# Patient Record
Sex: Female | Born: 1940 | Race: White | Hispanic: No | Marital: Married | Smoking: Former smoker
Health system: Southern US, Community
[De-identification: ages and names within clinical notes are randomized; demographics above are authoritative.]

## PROBLEM LIST (undated history)

## (undated) DIAGNOSIS — I8390 Asymptomatic varicose veins of unspecified lower extremity: Secondary | ICD-10-CM

## (undated) DIAGNOSIS — E039 Hypothyroidism, unspecified: Secondary | ICD-10-CM

## (undated) DIAGNOSIS — J45909 Unspecified asthma, uncomplicated: Secondary | ICD-10-CM

## (undated) DIAGNOSIS — L409 Psoriasis, unspecified: Secondary | ICD-10-CM

## (undated) DIAGNOSIS — D649 Anemia, unspecified: Secondary | ICD-10-CM

## (undated) DIAGNOSIS — K219 Gastro-esophageal reflux disease without esophagitis: Secondary | ICD-10-CM

## (undated) DIAGNOSIS — I4892 Unspecified atrial flutter: Secondary | ICD-10-CM

## (undated) DIAGNOSIS — I739 Peripheral vascular disease, unspecified: Secondary | ICD-10-CM

## (undated) DIAGNOSIS — J302 Other seasonal allergic rhinitis: Secondary | ICD-10-CM

## (undated) DIAGNOSIS — R600 Localized edema: Secondary | ICD-10-CM

## (undated) DIAGNOSIS — I499 Cardiac arrhythmia, unspecified: Secondary | ICD-10-CM

## (undated) DIAGNOSIS — R609 Edema, unspecified: Secondary | ICD-10-CM

## (undated) DIAGNOSIS — G629 Polyneuropathy, unspecified: Secondary | ICD-10-CM

## (undated) DIAGNOSIS — R42 Dizziness and giddiness: Secondary | ICD-10-CM

## (undated) DIAGNOSIS — K5900 Constipation, unspecified: Secondary | ICD-10-CM

## (undated) DIAGNOSIS — E079 Disorder of thyroid, unspecified: Secondary | ICD-10-CM

## (undated) DIAGNOSIS — M1990 Unspecified osteoarthritis, unspecified site: Secondary | ICD-10-CM

## (undated) DIAGNOSIS — I10 Essential (primary) hypertension: Secondary | ICD-10-CM

## (undated) DIAGNOSIS — F419 Anxiety disorder, unspecified: Secondary | ICD-10-CM

## (undated) DIAGNOSIS — C4490 Unspecified malignant neoplasm of skin, unspecified: Secondary | ICD-10-CM

## (undated) HISTORY — DX: Constipation, unspecified: K59.00

## (undated) HISTORY — PX: MOUTH SURGERY: SHX715

## (undated) HISTORY — PX: TUBAL LIGATION: SHX77

## (undated) HISTORY — DX: Other seasonal allergic rhinitis: J30.2

## (undated) HISTORY — DX: Unspecified osteoarthritis, unspecified site: M19.90

## (undated) HISTORY — DX: Asymptomatic varicose veins of unspecified lower extremity: I83.90

## (undated) HISTORY — PX: APPENDECTOMY: SHX54

## (undated) HISTORY — DX: Essential (primary) hypertension: I10

## (undated) HISTORY — PX: CHOLECYSTECTOMY: SHX55

## (undated) HISTORY — DX: Anemia, unspecified: D64.9

## (undated) HISTORY — DX: Dizziness and giddiness: R42

## (undated) HISTORY — DX: Disorder of thyroid, unspecified: E07.9

---

## 2001-03-30 ENCOUNTER — Encounter: Payer: Self-pay | Admitting: Obstetrics and Gynecology

## 2001-03-30 ENCOUNTER — Ambulatory Visit (HOSPITAL_COMMUNITY): Admission: RE | Admit: 2001-03-30 | Discharge: 2001-03-30 | Payer: Self-pay | Admitting: Obstetrics and Gynecology

## 2001-05-29 ENCOUNTER — Other Ambulatory Visit: Admission: RE | Admit: 2001-05-29 | Discharge: 2001-05-29 | Payer: Self-pay | Admitting: Obstetrics and Gynecology

## 2001-09-10 ENCOUNTER — Other Ambulatory Visit: Admission: RE | Admit: 2001-09-10 | Discharge: 2001-09-10 | Payer: Self-pay | Admitting: Dermatology

## 2002-03-29 ENCOUNTER — Other Ambulatory Visit: Admission: RE | Admit: 2002-03-29 | Discharge: 2002-03-29 | Payer: Self-pay | Admitting: Dermatology

## 2002-04-01 ENCOUNTER — Ambulatory Visit (HOSPITAL_COMMUNITY): Admission: RE | Admit: 2002-04-01 | Discharge: 2002-04-01 | Payer: Self-pay | Admitting: Obstetrics and Gynecology

## 2002-04-01 ENCOUNTER — Encounter: Payer: Self-pay | Admitting: Obstetrics and Gynecology

## 2002-06-28 ENCOUNTER — Other Ambulatory Visit: Admission: RE | Admit: 2002-06-28 | Discharge: 2002-06-28 | Payer: Self-pay | Admitting: Dermatology

## 2002-11-25 ENCOUNTER — Encounter: Payer: Self-pay | Admitting: Emergency Medicine

## 2002-11-25 ENCOUNTER — Emergency Department (HOSPITAL_COMMUNITY): Admission: EM | Admit: 2002-11-25 | Discharge: 2002-11-25 | Payer: Self-pay | Admitting: Emergency Medicine

## 2003-01-31 ENCOUNTER — Ambulatory Visit (HOSPITAL_COMMUNITY): Admission: RE | Admit: 2003-01-31 | Discharge: 2003-01-31 | Payer: Self-pay | Admitting: Pulmonary Disease

## 2003-03-03 ENCOUNTER — Other Ambulatory Visit: Admission: RE | Admit: 2003-03-03 | Discharge: 2003-03-03 | Payer: Self-pay | Admitting: Dermatology

## 2003-04-25 ENCOUNTER — Ambulatory Visit (HOSPITAL_COMMUNITY): Admission: RE | Admit: 2003-04-25 | Discharge: 2003-04-25 | Payer: Self-pay | Admitting: Pulmonary Disease

## 2003-05-04 ENCOUNTER — Encounter: Payer: Self-pay | Admitting: Obstetrics and Gynecology

## 2003-05-04 ENCOUNTER — Ambulatory Visit (HOSPITAL_COMMUNITY): Admission: RE | Admit: 2003-05-04 | Discharge: 2003-05-04 | Payer: Self-pay | Admitting: Obstetrics and Gynecology

## 2003-05-06 ENCOUNTER — Encounter (HOSPITAL_COMMUNITY): Admission: RE | Admit: 2003-05-06 | Discharge: 2003-06-05 | Payer: Self-pay | Admitting: Pulmonary Disease

## 2003-05-12 ENCOUNTER — Inpatient Hospital Stay (HOSPITAL_COMMUNITY): Admission: EM | Admit: 2003-05-12 | Discharge: 2003-05-13 | Payer: Self-pay | Admitting: Cardiology

## 2003-05-17 ENCOUNTER — Inpatient Hospital Stay (HOSPITAL_COMMUNITY): Admission: EM | Admit: 2003-05-17 | Discharge: 2003-05-23 | Payer: Self-pay | Admitting: Emergency Medicine

## 2003-05-18 ENCOUNTER — Encounter: Payer: Self-pay | Admitting: Cardiovascular Disease

## 2003-11-05 ENCOUNTER — Emergency Department (HOSPITAL_COMMUNITY): Admission: EM | Admit: 2003-11-05 | Discharge: 2003-11-06 | Payer: Self-pay | Admitting: Emergency Medicine

## 2003-12-26 ENCOUNTER — Ambulatory Visit (HOSPITAL_COMMUNITY): Admission: RE | Admit: 2003-12-26 | Discharge: 2003-12-26 | Payer: Self-pay | Admitting: Pulmonary Disease

## 2004-05-07 ENCOUNTER — Ambulatory Visit (HOSPITAL_COMMUNITY): Admission: RE | Admit: 2004-05-07 | Discharge: 2004-05-07 | Payer: Self-pay | Admitting: Pulmonary Disease

## 2004-05-24 ENCOUNTER — Ambulatory Visit (HOSPITAL_COMMUNITY): Admission: RE | Admit: 2004-05-24 | Discharge: 2004-05-24 | Payer: Self-pay | Admitting: Pulmonary Disease

## 2004-06-01 ENCOUNTER — Ambulatory Visit (HOSPITAL_COMMUNITY): Admission: RE | Admit: 2004-06-01 | Discharge: 2004-06-01 | Payer: Self-pay | Admitting: Pulmonary Disease

## 2004-06-27 ENCOUNTER — Ambulatory Visit (HOSPITAL_COMMUNITY): Admission: RE | Admit: 2004-06-27 | Discharge: 2004-06-27 | Payer: Self-pay | Admitting: Obstetrics & Gynecology

## 2004-11-17 ENCOUNTER — Emergency Department (HOSPITAL_COMMUNITY): Admission: EM | Admit: 2004-11-17 | Discharge: 2004-11-17 | Payer: Self-pay | Admitting: Emergency Medicine

## 2004-11-21 ENCOUNTER — Ambulatory Visit (HOSPITAL_COMMUNITY): Admission: RE | Admit: 2004-11-21 | Discharge: 2004-11-21 | Payer: Self-pay | Admitting: Family Medicine

## 2005-05-29 ENCOUNTER — Ambulatory Visit (HOSPITAL_COMMUNITY): Admission: RE | Admit: 2005-05-29 | Discharge: 2005-05-29 | Payer: Self-pay | Admitting: Obstetrics & Gynecology

## 2006-06-19 ENCOUNTER — Ambulatory Visit (HOSPITAL_COMMUNITY): Admission: RE | Admit: 2006-06-19 | Discharge: 2006-06-19 | Payer: Self-pay | Admitting: Obstetrics & Gynecology

## 2006-06-28 ENCOUNTER — Emergency Department (HOSPITAL_COMMUNITY): Admission: EM | Admit: 2006-06-28 | Discharge: 2006-06-28 | Payer: Self-pay | Admitting: Emergency Medicine

## 2006-09-17 ENCOUNTER — Ambulatory Visit (HOSPITAL_COMMUNITY): Admission: RE | Admit: 2006-09-17 | Discharge: 2006-09-17 | Payer: Self-pay | Admitting: Internal Medicine

## 2006-09-17 ENCOUNTER — Ambulatory Visit: Payer: Self-pay | Admitting: Internal Medicine

## 2006-10-08 ENCOUNTER — Ambulatory Visit: Payer: Self-pay | Admitting: Gastroenterology

## 2007-02-17 ENCOUNTER — Ambulatory Visit (HOSPITAL_COMMUNITY): Admission: RE | Admit: 2007-02-17 | Discharge: 2007-02-17 | Payer: Self-pay | Admitting: Family Medicine

## 2007-02-18 ENCOUNTER — Ambulatory Visit (HOSPITAL_COMMUNITY): Admission: RE | Admit: 2007-02-18 | Discharge: 2007-02-18 | Payer: Self-pay | Admitting: Family Medicine

## 2007-03-09 ENCOUNTER — Ambulatory Visit (HOSPITAL_COMMUNITY): Admission: RE | Admit: 2007-03-09 | Discharge: 2007-03-09 | Payer: Self-pay | Admitting: Family Medicine

## 2007-04-23 ENCOUNTER — Ambulatory Visit: Payer: Self-pay | Admitting: Orthopedic Surgery

## 2007-06-22 ENCOUNTER — Ambulatory Visit (HOSPITAL_COMMUNITY): Admission: RE | Admit: 2007-06-22 | Discharge: 2007-06-22 | Payer: Self-pay | Admitting: Obstetrics and Gynecology

## 2007-08-20 ENCOUNTER — Other Ambulatory Visit: Admission: RE | Admit: 2007-08-20 | Discharge: 2007-08-20 | Payer: Self-pay | Admitting: Obstetrics & Gynecology

## 2007-08-27 ENCOUNTER — Ambulatory Visit (HOSPITAL_COMMUNITY): Admission: RE | Admit: 2007-08-27 | Discharge: 2007-08-27 | Payer: Self-pay | Admitting: Obstetrics & Gynecology

## 2007-12-29 ENCOUNTER — Ambulatory Visit (HOSPITAL_COMMUNITY): Admission: RE | Admit: 2007-12-29 | Discharge: 2007-12-29 | Payer: Self-pay | Admitting: Family Medicine

## 2007-12-31 ENCOUNTER — Ambulatory Visit (HOSPITAL_COMMUNITY): Admission: RE | Admit: 2007-12-31 | Discharge: 2007-12-31 | Payer: Self-pay | Admitting: Family Medicine

## 2007-12-31 ENCOUNTER — Ambulatory Visit: Payer: Self-pay | Admitting: Cardiology

## 2008-01-29 DIAGNOSIS — Z8679 Personal history of other diseases of the circulatory system: Secondary | ICD-10-CM | POA: Insufficient documentation

## 2008-06-02 ENCOUNTER — Emergency Department (HOSPITAL_COMMUNITY): Admission: EM | Admit: 2008-06-02 | Discharge: 2008-06-02 | Payer: Self-pay | Admitting: Emergency Medicine

## 2008-06-23 ENCOUNTER — Ambulatory Visit (HOSPITAL_COMMUNITY): Admission: RE | Admit: 2008-06-23 | Discharge: 2008-06-23 | Payer: Self-pay | Admitting: Family Medicine

## 2008-09-05 ENCOUNTER — Other Ambulatory Visit: Admission: RE | Admit: 2008-09-05 | Discharge: 2008-09-05 | Payer: Self-pay | Admitting: Obstetrics and Gynecology

## 2008-09-19 ENCOUNTER — Ambulatory Visit: Payer: Self-pay | Admitting: Internal Medicine

## 2008-09-26 ENCOUNTER — Ambulatory Visit: Payer: Self-pay | Admitting: Internal Medicine

## 2008-10-12 ENCOUNTER — Ambulatory Visit: Payer: Self-pay | Admitting: Internal Medicine

## 2008-12-13 ENCOUNTER — Encounter: Payer: Self-pay | Admitting: Gastroenterology

## 2008-12-13 LAB — CONVERTED CEMR LAB
Eosinophils Relative: 5 % (ref 0–5)
HCT: 40.4 % (ref 36.0–46.0)
Lymphocytes Relative: 27 % (ref 12–46)
Lymphs Abs: 2.4 10*3/uL (ref 0.7–4.0)
Neutro Abs: 5.1 10*3/uL (ref 1.7–7.7)
Neutrophils Relative %: 58 % (ref 43–77)
Platelets: 223 10*3/uL (ref 150–400)
WBC: 8.9 10*3/uL (ref 4.0–10.5)

## 2009-02-16 ENCOUNTER — Ambulatory Visit: Payer: Self-pay | Admitting: Internal Medicine

## 2009-02-16 DIAGNOSIS — K625 Hemorrhage of anus and rectum: Secondary | ICD-10-CM | POA: Insufficient documentation

## 2009-02-16 DIAGNOSIS — K5900 Constipation, unspecified: Secondary | ICD-10-CM | POA: Insufficient documentation

## 2009-06-26 ENCOUNTER — Ambulatory Visit (HOSPITAL_COMMUNITY): Admission: RE | Admit: 2009-06-26 | Discharge: 2009-06-26 | Payer: Self-pay | Admitting: Obstetrics & Gynecology

## 2009-11-08 ENCOUNTER — Other Ambulatory Visit: Admission: RE | Admit: 2009-11-08 | Discharge: 2009-11-08 | Payer: Self-pay | Admitting: Obstetrics and Gynecology

## 2009-11-19 ENCOUNTER — Emergency Department (HOSPITAL_COMMUNITY): Admission: EM | Admit: 2009-11-19 | Discharge: 2009-11-19 | Payer: Self-pay | Admitting: Emergency Medicine

## 2009-12-25 ENCOUNTER — Encounter: Admission: RE | Admit: 2009-12-25 | Discharge: 2009-12-25 | Payer: Self-pay | Admitting: Family Medicine

## 2010-03-30 ENCOUNTER — Ambulatory Visit (HOSPITAL_COMMUNITY): Admission: RE | Admit: 2010-03-30 | Discharge: 2010-03-30 | Payer: Self-pay | Admitting: Family Medicine

## 2010-06-28 ENCOUNTER — Ambulatory Visit (HOSPITAL_COMMUNITY): Admission: RE | Admit: 2010-06-28 | Discharge: 2010-06-28 | Payer: Self-pay | Admitting: Obstetrics & Gynecology

## 2010-11-06 ENCOUNTER — Ambulatory Visit (HOSPITAL_COMMUNITY)
Admission: RE | Admit: 2010-11-06 | Discharge: 2010-11-06 | Payer: Self-pay | Source: Home / Self Care | Admitting: Pediatrics

## 2010-12-23 ENCOUNTER — Encounter: Payer: Self-pay | Admitting: Family Medicine

## 2011-04-16 NOTE — Procedures (Signed)
NAMEMANIYA, DONOVAN             ACCOUNT NO.:  [id number]   MEDICAL RECORD NO.:  [id number]          PATIENT TYPE:  OUT   LOCATION:  RAD                           FACILITY:  [facility]   PHYSICIAN:  Gerrit Friends. Dietrich Pates, MD, FACCDATE OF BIRTH:  1941-10-25   DATE OF PROCEDURE:  12/31/2007  DATE OF DISCHARGE:                                ECHOCARDIOGRAM   Referring Physician:  Dr. Marvel Plan.   CLINICAL DATA:  A 70 year old woman with dyspnea on exertion and a  systolic murmur.  M-mode aorta 2.3, left atrium 3.4, septum 1.2,  posterior wall 1.1, LV diastole 4.2, LV systole 3.2.  1. Technically suboptimal but adequate echocardiographic study.  2. Normal left and right atrial size; mild right ventricular      enlargement; normal wall thickness; normal systolic function.  3. Normal proximal ascending aorta.  4. Mild aortic valvular sclerosis.  5. Normal mitral and tricuspid valves; physiologic TR; estimated RV      systolic pressure at the upper limit of normal.  6. Normal proximal pulmonary artery; suboptimal imaging of the      pulmonic valve, which is grossly normal.  7. Normal LV size; borderline LVH; normal LV systolic function.  8. Normal IVC.      Gerrit Friends. Dietrich Pates, MD, [facility]  Electronically Signed     RMR/MEDQ  D:  01/01/2008  T:  01/01/2008  Job:  [id number]

## 2011-04-16 NOTE — Assessment & Plan Note (Signed)
[name], [name]              CHART#:  [id number]   DATE:  09/19/2008                       DOB:  10-07-1941   REASON FOR CONSULTATION:  Hemoccult positive stool.   PHYSICIAN REQUESTING CONSULTATION:  Cyril Mourning, nurse practitioner  with Tilda Burrow, MD   HISTORY OF PRESENT ILLNESS:  The patient is a 70 year old Caucasian  female who presents today due to history of hemoccult-positive stool on  digital rectal exam.  She saw Cyril Mourning, nurse practitioner  recently for a yearly physical.  She was noted to have some small  hemorrhoids and irritation around the rectum and her hemoccult was  positive.  The patient had also complained of not been able to donate  blood the last 2 times she went because her blood work count was too  low.  Otherwise, she has not been having any problems.  She denies any  abdominal pain, nausea, vomiting, heartburn, weight loss, dysphasia,  odynophagia, or melena.  She denies any rectal bleeding.  She has not  had any problems with her hemorrhoids in about 3 months.  When they  become inflamed, she does have a lot of pain associated with them.  She  has been taking her stool softener once daily with the last one that she  bought actually had a stimulant in it.  She complains of having loose  explosive stools every day with taking one of these daily.   CURRENT MEDICATIONS:  1. Nystatin cream at bedtime.  2. Estradiol 60 mg daily.  3. Xanax 0.25 mg p.r.n.  4. Multivitamin daily.  5. Ziac, bisoprolol/hydrochlorothiazide 10/6.25 mg daily.  6. Benicar 40 mg daily.  7. Docusate/sennosides daily.  8. Calcium with vitamin D daily.   ALLERGIES:  Augmentin, ACE inhibitors, and Entex LA.   PAST MEDICAL HISTORY:  Hypertension, anxiety, bronchitis, and history of  hemorrhoids.  Last colonoscopy in October 2007, revealed anal papillae  and internal hemorrhoids.  She is recommended to have a followup study  in October 2017.  She has had a  cholecystectomy and tubal ligation.   FAMILY HISTORY:  Father deceased age 74 due to MI.  No family history of  colorectal cancer.   SOCIAL HISTORY:  She is married.  She is retired from [employer].  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  She is a nonsmoker,  consumes about 3 ounces of wine 3 times a week.   REVIEW OF SYMPTOMS:  See HPI for GI.  CONSTITUTIONAL:  No weight loss.  CARDIOPULMONARY:  No chest pain, shortness of breath, palpitations, or  cough.  GENITOURINARY:  No dysuria or hematuria.   PHYSICAL EXAMINATION:  VITAL SIGNS:  Weight 169, height 5 feet 5 inches,  temperature 98, blood pressure 138/92, and pulse 60.  GENERAL:  A pleasant, well-nourished, well-developed [name] in  no acute distress.  SKIN:  Warm and dry.  No jaundice.  HEENT:  Sclerae nonicteric.  Oropharyngeal mucosa moist and pink.  No  lesions, erythema, or exudate.  No lymphadenopathy or thyromegaly.  CHEST:  Lungs are clear to auscultation.  CARDIAC:  Regular rate and rhythm.  Normal S1 and S2.  No murmurs, rubs,  or gallops.  ABDOMEN:  Positive bowel sounds.  Abdomen is soft, nontender, and  nondistended.  No organomegaly or masses.  No rebound  or guarding.  No  abdominal bruits or hernias.  LOWER EXTREMITIES:  No trace pedal edema bilaterally.  RECTAL:  Small external hemorrhoids, nonthrombosed, and nonbleeding.  There were erythema.  Internal rectal exam, nontender.  Secretions are  heme-negative.   IMPRESSION:  The patient is a  70 year old lady with recent hemoccult-  positive stool on digital rectal exam.  She reports being unable to  donate blood on two occasions in the last 6 months.  We will start out  with checking her hemoglobin.  It is very reassuring that she has had an  unremarkable colonoscopy within the last 2 years.  Hemoccult positive  stool may have been on the basis of perianal irritation.   PLAN:  1. Check CBC.  2. Hemoccult stool x3, mail-in  cards.  3. Further recommendations to follow.   I would like to thank Cyril Mourning, nurse practitioner with Dr.  Emelda Fear for allowing [place] to take part in the care of this patient.       Tana Coast, P.A.  Electronically Signed     R. Roetta Sessions, M.D.  Electronically Signed    LL/MEDQ  D:  09/19/2008  T:  09/19/2008  Job:  [id number]   cc:   Jeoffrey Massed, MD  Cyril Mourning, NP  Tilda Burrow, M.D.

## 2011-04-19 NOTE — Discharge Summary (Signed)
[name], [name]                       ACCOUNT NO.:  [id number]   MEDICAL RECORD NO.:  [id number]                   PATIENT TYPE:  INP   LOCATION:  2030                                 FACILITY:  [facility]   PHYSICIAN:  Charlies Constable, M.D.                  DATE OF BIRTH:  1941-01-19   DATE OF ADMISSION:  05/12/2003  DATE OF DISCHARGE:  05/13/2003                           DISCHARGE SUMMARY - REFERRING   SUMMARY OF HISTORY:  Ms. Baril is a 70 year old white female who present  to the [facility] office with chest tightness when she takes a deep breath.  Stress Cardiolite had been performed on May 06, 2003, where she exercised  for 8 minutes 27 seconds reaching 82% of her predicted maximum heart rate.  Net level achieved was 10.1 without EKG abnormalities.  However, imaging  showed anterior wall hyperperfusion that reversed with rest.  Her EF was  67%.  She is being admitted to [facility] for a cardiac catheterization to further  evaluate her discomfort and positive Cardiolite.   PAST MEDICAL HISTORY:  1. She has a history of hypertension.  2. Arthritis.  3. Obesity.   PREADMISSION LABORATORY DATA:  Showed H&H 12.3 and 36.5, normal indices,  platelets 141, WBC 7.9.  PT 15.1.  Sodium 141, potassium 3.5, BUN 19,  creatinine 1.0, glucose 110.  CK total on admission 117, MB 3.8, Troponin  0.01.   Chest x-ray did not reveal any active disease.   EKG showed fast bradycardia with a ventricular rate of 56, normal axis,  nonspecific ST-T wave changes.   HOSPITAL COURSE:  Problem #1.  Ms. Hapke was brought to [facility],  placed on IV heparin and her home medicines.  Research foundation entered  her into the Matrix study and randomized her to the Matrix device.   Problem #2.  Cardiac catheterization was performed on May 13, 2003, by Dr.  Juanda Chance.  She did not have any coronary artery disease and an EF of 60%.  She  also did not have any aorta, renal or peripheral vascular  disease.   Postsheath removal and bedrest, the patient was ambulating.  She was  complaining of some tenderness to her catheterization site but her  catheterization site was intact and it was felt that she could be discharged  home.   DISCHARGE DIAGNOSIS:  Noncardiac chest discomfort, false positive stress  Cardiolite.   DISPOSITION:  She is discharged home.   DISCHARGE MEDICATIONS:  Her medications remain unchanged from prior to  admission.  These include:  1. Evista 60 mg daily.  2. Centrum Silver daily.  3. Metoprolol/HCTZ 10/12.5 mg daily.  4. Vioxx as previously.  5. Citrucel and stool softener as previously.  6. Nexium 40 mg daily.  7. Tranxene as previously.   ACTIVITY:  She was advised no lifting, driving, sexual activity or heavy  exertion x2 days.   DIET:  Maintain low-salt, low-fat, low-cholesterol  diet.   DISCHARGE INSTRUCTIONS:  If she has any problems with her catheterization  site, she was asked to call our office for evaluation.   FOLLOWUP:  She was asked to arrange followup appointment with Dr. Juanetta Gosling  for continuing evaluation of her chest discomfort of a noncardiac etiology.     Joellyn Rued, P.A.-C  [facility]                 Charlies Constable, M.D.    EW/MEDQ  D:  05/13/2003  T:  05/13/2003  Job:  [id number]   cc:   Thomas C. Wall, M.D.   Edward L. Juanetta Gosling, M.D.  [address]  [place]  [place] [address]  Fax: [phone]

## 2011-04-19 NOTE — Procedures (Signed)
   NAMEFADIA, [name]                       ACCOUNT NO.:  [id number]   MEDICAL RECORD NO.:  [id number]                   PATIENT TYPE:  PREC   LOCATION:                                       FACILITY:  [facility]   PHYSICIAN:  Edward L. Juanetta Gosling, M.D.             DATE OF BIRTH:  1941/06/06   DATE OF PROCEDURE:  05/06/2003  DATE OF DISCHARGE:                                    STRESS TEST   INDICATIONS FOR PROCEDURE:  [name] is undergoing Cardiolite graded  exercise testing due to chest discomfort.  She is actually having the chest  discomfort before she starts the test today.  There are no contraindications  to graded exercise testing.  Her blood pressure was somewhat elevated before  she started exercise.   [name] exercised for eight minutes 27 seconds on the Bruce protocol,  reaching sustained 10.1 METs.  Her maximum recorded heart rate was 130 which  is 82% of her age-predicted maximal heart rate.  Cardiolite was injected per  protocol and she exercised for one minute after Cardiolite injection.  She  had no electrocardiographic changes suggestive of inducible ischemia and her  chest discomfort did not change from previous level.  Blood pressure  response to exercise was normal.   IMPRESSION:  1. No evidence of inducible ischemia.  2. Normal blood pressure response to exercise.  3. Good exercise tolerance.  4. Chest pain with exercise but no increase in intensity with exercise.  5. Cardiolite images pending.                                               Edward L. Juanetta Gosling, M.D.    ELH/MEDQ  D:  05/06/2003  T:  05/06/2003  Job:  [id number]

## 2011-04-19 NOTE — Cardiovascular Report (Signed)
NAMERAYEN, PALEN                       ACCOUNT NO.:  [id number]   MEDICAL RECORD NO.:  [id number]                   PATIENT TYPE:  INP   LOCATION:  2030                                 FACILITY:  [facility]   PHYSICIAN:  Charlies Constable, M.D.                  DATE OF BIRTH:  [birth year]   DATE OF PROCEDURE:  05/13/2003  DATE OF DISCHARGE:  05/13/2003                              CARDIAC CATHETERIZATION   PROCEDURE PERFORMED:  Cardiac catheterization.   CARDIOLOGIST:  Charlies Constable, M.D.   CLINICAL HISTORY:  Ms. Villegas is 70 years old and has no history of known  coronary heart disease.  Recently she has had some chest tightness, which  seemed to be worse with breathing.  She had a Cardiolite scan which  suggested possible anterior ischemia.  She was seen by Dr. Daleen Squibb in  consultation and scheduled for evaluation angiography.  She does have a  history of hypertension.   DESCRIPTION OF PROCEDURE:  The procedure was performed via the right femoral  artery using arterial sheath and 6 French preformed coronary catheters.  A  femoral arterial puncture was performed an Omnipaque contrast was used.  Distal aortogram was performed to rule out renovascular causes of  hypertension.  The right femoral artery was closed with the Matrix device at  the end of the procedure.   The patient tolerated the procedure well and left the laboratory in  satisfactory condition.   RESULTS:  Left Main Coronary Artery:  The left main coronary artery is free  of significant disease.   Left Anterior Descending Artery:  The left anterior descending artery gave  rise to three diagonal branches and a septal perforator.  These and the LAD  proper were free of significant disease.   Left Circumflex Artery:  The left circumflex artery gave rise to a marginal  branch and two posterolateral branches.  These vessels were free of  significant disease.   Right Coronary Artery:  The right coronary artery is a  moderate-sized vessel  that gave rise to a conus branch, two right ventricular branches, a  posterior descending branch, and three posterolateral branches.  These  vessels were free of significant disease.   LEFT VENTRICULOGRAM:  The left ventriculogram performed in the RAO  projection showed good wall motion and no areas of hypokinesis.  The  estimated ejection fraction was 50.   DISTAL AORTOGRAM:  Distal aortogram was performed which patent renal  arteries and no significant iliac obstruction.   HEMODYNAMIC DATA:  The aortic pressure was 155/83 with a mean of 114.  The  left ventricular pressure was 155/11.   CONCLUSION:  Normal coronary angiography and left ventricular wall motion.   RECOMMENDATIONS:  In view of these findings we the recent Cardiolite scan  was a false positive test.  We will plan reassurance.  We will plan to let  her go home later today and follow  up with Dr. Juanetta Gosling, and he can decide if  further evaluation for other cause of her symptoms is needed.                                                 Charlies Constable, M.D.    BB/MEDQ  D:  05/13/2003  T:  05/14/2003  Job:  [id number]   cc:   Ramon Dredge L. Juanetta Gosling, M.D.  [address]  [place]  [place] [address]  Fax: [phone]   Jesse Sans. Wall, M.D.   Cardiopulmonary Laboratory

## 2011-04-19 NOTE — H&P (Signed)
[name], [name]                       ACCOUNT NO.:  [id number]   MEDICAL RECORD NO.:  [id number]                   PATIENT TYPE:  INP   LOCATION:  1823                                 FACILITY:  [facility]   PHYSICIAN:  Willa Rough, M.D.                  DATE OF BIRTH:  1941/04/21   DATE OF ADMISSION:  05/17/2003  DATE OF DISCHARGE:                                HISTORY & PHYSICAL   HISTORY OF PRESENT ILLNESS:  The patient underwent heart catheterization on  May 13, 2003, at [facility].  Please see the admission H&P on May 12, 2003, at [facility].  Please see the discharge summary of May 13, 2003, at [facility].   The patient had a Cardiolite scan that was considered abnormal with a normal  ejection fraction.  Decision was made to bring her for catheterization  because of ongoing testing.  She underwent a catheterization on May 13, 2003.  There was no significant coronary artery disease.  The patient was  seen for evaluation in the Matrix trial.  She was randomized to receive a  Matrix device (a procedure to close the femoral artery puncture site). The  patient had the procedure done with no difficulties and she went home.  On  May 14, 2003, she rested at home. On May 15, 2003, she traveled by car to  [place] and returned today. When the patient first went home, she had only  minor difficulties in her right groin. However, today, she has had  increasing pain and she has noted ecchymoses and she called.  We encouraged  her to come here to be seen directly.   ALLERGIES:  CEFTIN, BIAXIN, AND KEFLEX.  She also gets nauseated with  CODEINE.   MEDICATIONS:  1. Evista 60 mg daily.  2. Centrum Silver daily.  3. Metoprolol hydrochlorothiazide 10/12.5.  4. Citrucel.  5. Nexium p.r.n.  6. Transene p.r.n.   PAST MEDICAL HISTORY:  For other medical problems, see the complete list  below.   SOCIAL HISTORY:  She is retired from [employer] worker at [employer].   FAMILY HISTORY:  Not significant for coronary artery disease.   REVIEW OF SYSTEMS:  Overall today, she is actually doing well.  She is  feeling nervous. She has no GI or GU symptoms. Her only complaint is  discomfort in her right groin area. She also has question of slight  discomfort in her right calf.   PHYSICAL EXAMINATION:  VITAL SIGNS: When the patient first arrived, her  blood pressure was 160/90. She was in no distress.  Temperature is 98.1.  LUNGS:  Clear.  NECK:  Normal.  HEENT:  No marked abnormalities.  HEART:  Reveals an S1 and S2, but no clicks or significant murmurs.  ABDOMEN:  Benign.  The patient has an area of soft ecchymoses approximately  two inches above  her calf site and six inches below the site. There is an  indurated area of approximately 1-1/2 inches in diameter.  I do not hear a  bruit.  There is no abnormality of the lower extremity noted.   LABORATORY DATA:  Pending at this time.  They will include a CBC, PT, PTT,  and BMET.   PROBLEM LIST:  1. Status post recent cardiac catheterization with a Matrix closure device     used on May 13, 2003.  2. Pain at the calf site. The examination is abnormal.  The patient has     ecchymoses. There is also a tender area at the catheterization site.  I     am not convinced of a definite pseudoaneurysm.  The patient is anxious at     this time, but the area is painful to her and she will be observed and it     will be evaluated.  3. Question of very slight tenderness of the right calf. I do not believe     there is evidence of any deep venous thrombosis, but this will be ruled     out later on the morning of May 18, 2003.  I have chosen not to     anticoagulate the patient at this time because of her groin.  4. History of hypertension and we will be treating her blood pressure.  5. History of allergies to Ceftin, Biaxin, and Keflex.  6. History of nausea to Codeine.   The patient will have  venous Dopplers on June 16, and a Doppler of the calf  site to rule out a pseudoaneurysm.  We will notify the Matrix study nurses.  We will be treating her blood pressure and watching her hemoglobin  carefully.  Her blood is being typed and screened.                                               Willa Rough, M.D.    Cleotis Lema  D:  05/17/2003  T:  05/17/2003  Job:  [id number]   cc:   Ramon Dredge L. Juanetta Gosling, M.D.  [address]  [place]  [place] [address]  Fax: [phone]   Jesse Sans. Wall, M.D.    cc:   Oneal Deputy. Juanetta Gosling, M.D.  [address]  [place]  [place] [address]  Fax: [phone]   Jesse Sans. Wall, M.D.

## 2011-04-19 NOTE — Op Note (Signed)
[name], [name]             ACCOUNT NO.:  [id number]   MEDICAL RECORD NO.:  [id number]          PATIENT TYPE:  AMB   LOCATION:  DAY                           FACILITY:  [facility]   PHYSICIAN:  R. Roetta Sessions, M.D. DATE OF BIRTH:  January 29, 1941   DATE OF PROCEDURE:  09/17/2006  DATE OF DISCHARGE:                                  PROCEDURE NOTE   PROCEDURE:  Screening colonoscopy.   ENDOSCOPIST:  Jonathon Bellows, M.D.   INDICATIONS FOR PROCEDURE:  The patient is a 70 year old lady referred at  courtesy of Dr. Christin Bach and associates for colorectal cancer  screening.  This lady has no lower GI tract symptoms.  She had a flexible  sigmoidoscopy in 1996.  There is no family history for colorectal neoplasia.  She has never had her entire lower GI tract evaluated.  Colonoscopy is now  being done as an a standard screening maneuver.  This approach has been  discussed with the patient at length.  Potential risks, benefits and  alternatives have been reviewed and questions answered; she is agreeable.  Please see documentation in the medical record.   PROCEDURE NOTE:  O2 saturation, blood pressure, pulse and respirations were  monitor throughout the entire procedure.   CONSCIOUS SEDATION:  Versed 3 mg IV, Demerol 75 mg IV in divided doses.   INSTRUMENT:  Olympus video chip system.   FINDINGS:  Digital rectal exam revealed no abnormalities.   ENDOSCOPIC FINDINGS:  The prep was excellent.   RECTUM:  Examination of the rectal mucosa including a retroflex view of the  anal verge revealed a couple anal papillae and internal hemorrhoids;  otherwise, rectal mucosa appeared entirely normal.   COLON:  Colonic mucosa was surveyed from the rectosigmoid junction through  the left, transverse and right colon to the area of the appendiceal orifice,  ileocecal valve and cecum.  These structures were well seen and photographed  for the record.  From this level, the scope was slowly and  cautiously  withdrawn and all previously mentioned mucosal surfaces were again seen.  The colonic mucosa appeared normal.  The patient tolerated the procedure  well and was reactive at endoscopy.   IMPRESSION:  Anal papillae and internal hemorrhoids, otherwise normal rectum  and normal colon.   RECOMMENDATIONS:  Repeat screening colonoscopy in 10 years.      Jonathon Bellows, M.D.  Electronically Signed     RMR/MEDQ  D:  09/17/2006  T:  09/18/2006  Job:  [id number]   cc:   Jeoffrey Massed, MD  Fax: [phone]   Cyril Mourning, FNP   Tilda Burrow, M.D.  Fax: [phone]

## 2011-04-19 NOTE — Consult Note (Signed)
[name], [name]                       ACCOUNT NO.:  [id number]   MEDICAL RECORD NO.:  [id number]                   PATIENT TYPE:  INP   LOCATION:  3710                                 FACILITY:  [facility]   PHYSICIAN:  [name]. [name], M.D.               DATE OF BIRTH:  1941/09/20   DATE OF CONSULTATION:  DATE OF DISCHARGE:                                   CONSULTATION   CHIEF COMPLAINT:  Tenderness in right femoral area following cardiac  catheterization.   HISTORY OF PRESENT ILLNESS:  This is a 70 year old female patient who has  been having upper chest pain for the last month and was evaluated on June 10  by Dr. Juanito Doom.  She was found to have an abnormal Cardiolite study and  continued chest discomfort and was referred for urgent cardiac  catheterization which was performed on June 11.  Apparently, this was normal  and she was randomized to receive a Matrix device to close her femoral  artery at the puncture site.  She was discharged following her cardiac  catheterization and over the last several days has had increasing pain with  some minimal drainage from the right inguinal region and some swelling.  She  was readmitted on June 15 with no fever or evidence of sepsis but localized  pain.  Since being in the hospital, she has undergone duplex scanning of her  right inguinal region which revealed no evidence of pseudoaneurysm or AV  fistula and no large hematoma.  She also has no evidence of deep vein  thrombosis or thrombophlebitis on duplex scanning of the right lower  extremity.  CT scan has been obtained with results pending.  She denies any  chills and fever at home.   PAST MEDICAL HISTORY:  1. Hypertension.  2. Negative for diabetes mellitus, chronic obstructive pulmonary disease,     deep vein thrombosis, thrombophlebitis, pulmonary emboli.   PAST SURGICAL HISTORY:  Cholecystectomy.   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   FAMILY HISTORY:  Negative for coronary artery disease.   REVIEW OF SYSTEMS:  Denies lower extremity claudication symptoms or  neurologic symptoms consistent with TIA's or amaurosis fugax or stroke.   MEDICATIONS:  1. Evista 60 mg daily.  2. Centrum Silver daily.  3. Metoprolol/hydrochlorothiazide 10/12.5.  4. Citrucel.  5. Nexium p.r.n.  6. Tranxene p.r.n. allergies.  7. Ceftin.  8. Biaxin.  9. Keflex.   ALLERGIES:  He gets nauseated with CODEINE.   PHYSICAL EXAMINATION:  VITAL SIGNS:  Temperature 98, blood pressure 160/90,  heart rate 70, respirations are 14.  GENERAL:  She is a healthy-appearing female who was in no apparent distress.  She is alert and oriented x3.  NECK:  Supple with 3+ carotid pulses.  No palpable adenopathy in her neck.  Upper extremity pulses were 3+ bilaterally.  NEUROLOGIC:  Normal.  CHEST:  Clear to auscultation.  ABDOMEN:  Soft, nontender with no masses.  Lower abdomen has some diffuse  ecchymosis beginning in the right inguinal region.  EXTREMITIES:  A puncture site in the right groin with a very minimal amount  of light-brownish fluid being able to be expressed and no fluctuance or  hematoma in this area.  There is diffuse ecchymosis in the proximal thigh  medially, in the lower abdomen, and inguinal crease over to and across the  midline.  No tenderness in the lower abdomen, however.  Distal pulses were  3+ in dorsalis pedis and posterior tibial with no distal edema.   IMPRESSION:  Status post right femoral artery closure from cardiac  catheterization using Matrix device with some localized cellulitis but no  evidence of infected hematoma or fluctuance.  Would recommend (1)  intravenous antibiotics and (2) warm moist heat to the right inguinal region  and we will follow her along with you.  If she develops an abscess, this  will need surgical drainage.                                               [name] [name], M.D.     JDL/MEDQ  D:  05/19/2003  T:  05/20/2003  Job:  [id number]   cc:   Ramon Dredge L. Juanetta Gosling, M.D.  [address]  [place]  [place] [address]  Fax: [phone]   Rockey Situ. Flavia Shipper., M.D.  [address]  [place]  [place] [address]  Fax: [phone]

## 2011-04-19 NOTE — Discharge Summary (Signed)
[name], French                       ACCOUNT NO.:  [id number]   MEDICAL RECORD NO.:  [id number]                   PATIENT TYPE:  INP   LOCATION:  3710                                 FACILITY:  [facility]   PHYSICIAN:  [name], M.D.                  DATE OF BIRTH:  1941-02-28   DATE OF ADMISSION:  05/17/2003  DATE OF DISCHARGE:  05/23/2003                           DISCHARGE SUMMARY - REFERRING   DISCHARGE DIAGNOSES:  1. Local inflammation/cellulitis of right groin catheterization site     (closure with Matrix device), intravenous antibiotic therapy for six days     this admission.  2. Chest pain x1 month with abnormal Cardiolite, left heart catheterization     on May 13, 2003, (no significant coronary artery disease).  3. Hypertension.  4. Status post cholecystectomy.   PROCEDURE:  1. May 18, 2003, ultrasound evaluation of the right groin; negative for     pseudoaneurysm or AV fistula.  2. May 18, 2003, ultrasound lower extremity; negative for DVT.   DISPOSITION:  The patient is ready for discharge on May 23, 2003.  She has  been afebrile this hospitalization. Wound culture has been nonspecific. No  organisms were seen. She has been maintained on IV Vancomycin, IV Primaxin  for the course of this hospitalization over a six-day period.  Pain which  has existed at the right groin has improved as has the swelling and the  ecchymoses.  She was admitted with drainage at that site, drainage has not  persisted.   DISCHARGE MEDICATIONS:  1. Evista 60 mg daily.  2. Multivitamin daily.  3. Ziac 10/6.25 mg daily.  4. Vioxx as needed.  5. Citrucel as needed.  6. Stool softener daily.  7. Nexium 40 mg as needed.  8. Tranxene as needed.  9. Avelox 400 mg daily for a 10-day course.  10.      Darvocet-N 100 one to two tablets every three to four hours as     needed for pain.   ACTIVITY:  She is to walk daily to keep up her strength. She is asked not to  lift any heavy  weights. She is not to drive until she sees [name].   DIET:  Low sodium, low cholesterol diet.   WOUND French:  She may shower.  She is to call [name] office in [place]  if she experiences anymore drainage or if the pain suddenly increases in  severity.   FOLLOW UP:  She has a follow-up visit with [name] on Wednesday on June 15, 2003, at 12:15 p.m.  She will also see the vascular surgeon, [name].  [name], M.D., of the [facility] on  Tuesday, May 31, 2003, at 9:40 a.m.   HISTORY OF PRESENT ILLNESS:  The patient is a 70 year old female. She  underwent left heart catheterization on May 13, 2003, at [facility].  Prior to that she had an abnormal Cardiolite study with a normal  ejection fraction. The decision was made to bring her in for  catheterization. There is no significant coronary artery disease discovered.  The patient was seen for evaluation in the Matrix trial. She was randomized  to receive the Matrix device. The patient had the procedure done with no  difficulties, and she went home. On June 12, the day after the procedure,  she rested at home.  On June 13, she traveled by car to [place] and  returned on June 15.  The patient first went home from the hospitalization  after catheterization, she had only minor difficulties in her right groin.  However, on the day of admission, June 15, she has had increasing pain and  has noted bruising and called the office of [name].  She came  directly to [facility] for venous Doppler studies and vascular  surgery consult. She will also be maintained on IV antibiotics during this  hospitalization.   HOSPITAL COURSE:  As described in discharge disposition.  She was admitted  on June 15 with increased pain, mild drainage, and ecchymoses at the site of  a right groin left heart catheterization which was done on June 11.  The  patient was placed on IV antibiotics.  Culture of the drainage was obtained  and the cultures were negative for growth.  Ultrasound of the right groin  was negative for pseudoaneurysm. Ultrasound of the right leg was negative  for deep venous thrombosis.  The remainder of her hospital course from June  16 to June 21, she was maintained on IV antibiotics.  Both IV Vancomycin and  IV Primaxin. She was seen in consultation by [name]. [name], M.D. who  agreed with IV antibiotics and localized heat. Her pain is now well  controlled. She is ambulating well.  She has not had any fevers during this  admission, nor has she had any chest pain or shortness of breath.  She goes  home with the medications and with follow-up as dictated above.     [name], P.A.                    [name], M.D.    GM/MEDQ  D:  05/23/2003  T:  05/24/2003  Job:  [id number]   cc:   [name], M.D.   [name], M.D.  [address]  [place]  [place] [address]  Fax: [phone]   [name]. [name], M.D.  [address]  [place]  [place] [address]  Fax: [phone]    cc:   [name], M.D.   [name], M.D.  [address]  [place]  [place] [address]  Fax: [phone]   [name]. [name], M.D.  [address]  [place]  [place] [address]  Fax: [phone]

## 2011-04-19 NOTE — H&P (Signed)
NAMEUNDINE, [name]                       ACCOUNT NO.:  [id number]   MEDICAL RECORD NO.:  [id number]                   PATIENT TYPE:  INP   LOCATION:  NA                                   FACILITY:  [facility]   PHYSICIAN:  Thomas C. Wall, M.D.                DATE OF BIRTH:  12-01-1941   DATE OF ADMISSION:  DATE OF DISCHARGE:                                HISTORY & PHYSICAL   CHIEF COMPLAINT:  Chest tightness that is worse when I take a deep breath.   HISTORY OF PRESENT ILLNESS:  [name] is a delightful 70-year-  old, married, white female who comes to the office today in [place],  [place], with the above complaint.  She had a stress Cardiolite  performed on May 06, 2003.  She exercised for 8 minutes and 27 seconds,  reaching 82% of her predicted maximum heart rate.  MET level achieved was  10.1.  There were no EKG abnormalities.   There was anterior wall hypoperfusion with stress that reversed with rest.  The ejection fraction was 67%.  She is now being transferred to [facility] after  her chest discomfort her in the office was relieved with sublingual  nitroglycerin.  She had ST segment flattening in the inferolateral leads.  She is being transferred via [facility].   PAST MEDICAL HISTORY:  She has a history of hypertension and arthritis.   PAST SURGICAL HISTORY:  She has had a cholecystectomy in the past in 1998.   ALLERGIES:  She is intolerance of CEFTIN, BIAXIN, and KEFLEX which causes  itching.   HABITS:  She does not smoke or drink.   CURRENT MEDICATIONS:  1. Evista 60 mg a day.  2. Centrum Silver daily.  3. Metoprolol/HCTZ  10/12.5 mg a day.  4. Vioxx, unknown dose.  5. Citrucel p.r.n.  6. Stool softener p.r.n.  7. Nexium, which she started two weeks ago.  8. Tranxene p.r.n.  She took a fourth of a 7.5 mg today before coming to the     clinic.   We saw her four years ago in the office and did a stress Cardiolite.  There  was no ischemia at  that time.   REVIEW OF SYSTEMS:  Remarkable for dyspnea on exertion after 67 stairs.   SOCIAL HISTORY:  She retired from Clinical biochemist at [employer].  She has  two daughters and is married.   FAMILY HISTORY:  Really insignificant or not remarkable for any coronary  disease.   PHYSICAL EXAMINATION:  GENERAL APPEARANCE:  Her exam today showed her to be  quite anxious, but very pleasant.  She is in no acute distress.  VITAL SIGNS:  Her blood pressure is 140/100 in the right arm and 138/100 in  the left arm.  The pulse is 68 in sinus bradycardia.  WEIGHT:  154 pounds.  SKIN:  Warm and dry.  NEUROLOGIC:  Intact.  NECK:  No JVD.  Carotid upstrokes are equal bilaterally without bruits.  There is no thyroid enlargement.  LUNGS:  Clear.  HEART:  Regular rate and rhythm without murmur, rub, or gallop.  ST  physiologically splits.  ABDOMEN:  Soft with good bowel sounds.  There is no tenderness.  EXTREMITIES:  No cyanosis, clubbing, or edema.  Distal pulses were 2+/4+ in  both dorsalis pedis and posterior tibials.   ASSESSMENT:  1. Chest discomfort relieved with nitroglycerin with a positive stress     Cardiolite suggesting anterior wall ischemia.  2. Hypertension.   PLAN:  1. Transfer to [facility].  2. Intravenous heparin given here in the clinic with a drip.  3. Check serial enzymes.  4. Cardiac catheterization tomorrow.  The indications, procedure, and risks     have been discussed with the patient and her husband.  They agree to     proceed.  She is scheduled to have this done tomorrow by Charlies Constable,     M.D.                                               Thomas C. Wall, M.D.    TCW/MEDQ  D:  05/12/2003  T:  05/12/2003  Job:  [id number]   cc:   Ramon Dredge L. Juanetta Gosling, M.D.  [address]  [place]  [place] [address]  Fax: [phone]

## 2011-05-17 ENCOUNTER — Other Ambulatory Visit: Payer: Self-pay | Admitting: Obstetrics & Gynecology

## 2011-05-17 DIAGNOSIS — Z139 Encounter for screening, unspecified: Secondary | ICD-10-CM

## 2011-07-05 ENCOUNTER — Ambulatory Visit (HOSPITAL_COMMUNITY)
Admission: RE | Admit: 2011-07-05 | Discharge: 2011-07-05 | Disposition: A | Discharge status: Home / Self Care | Payer: Medicare Other | Source: Ambulatory Visit | Attending: Obstetrics & Gynecology | Admitting: Obstetrics & Gynecology

## 2011-07-05 DIAGNOSIS — Z139 Encounter for screening, unspecified: Secondary | ICD-10-CM

## 2011-07-05 DIAGNOSIS — Z1231 Encounter for screening mammogram for malignant neoplasm of breast: Secondary | ICD-10-CM | POA: Insufficient documentation

## 2011-08-28 ENCOUNTER — Ambulatory Visit (HOSPITAL_COMMUNITY)
Admission: RE | Admit: 2011-08-28 | Discharge: 2011-08-28 | Disposition: A | Discharge status: Home / Self Care | Payer: Medicare Other | Source: Ambulatory Visit | Attending: Pediatrics | Admitting: Pediatrics

## 2011-08-28 DIAGNOSIS — I517 Cardiomegaly: Secondary | ICD-10-CM

## 2011-08-28 DIAGNOSIS — I10 Essential (primary) hypertension: Secondary | ICD-10-CM | POA: Insufficient documentation

## 2011-08-28 DIAGNOSIS — R42 Dizziness and giddiness: Secondary | ICD-10-CM | POA: Insufficient documentation

## 2011-09-16 ENCOUNTER — Other Ambulatory Visit: Payer: Self-pay | Admitting: Pediatrics

## 2011-10-10 ENCOUNTER — Ambulatory Visit (INDEPENDENT_AMBULATORY_CARE_PROVIDER_SITE_OTHER): Payer: Medicare Other | Admitting: Otolaryngology

## 2011-10-10 DIAGNOSIS — H903 Sensorineural hearing loss, bilateral: Secondary | ICD-10-CM

## 2011-11-12 ENCOUNTER — Other Ambulatory Visit: Payer: Self-pay | Admitting: Family Medicine

## 2011-11-19 ENCOUNTER — Other Ambulatory Visit: Payer: Self-pay | Admitting: Adult Health

## 2011-11-19 ENCOUNTER — Other Ambulatory Visit (HOSPITAL_COMMUNITY)
Admission: RE | Admit: 2011-11-19 | Discharge: 2011-11-19 | Disposition: A | Discharge status: Home / Self Care | Payer: Medicare Other | Source: Ambulatory Visit | Attending: Cardiology | Admitting: Cardiology

## 2011-11-19 DIAGNOSIS — Z79899 Other long term (current) drug therapy: Secondary | ICD-10-CM

## 2011-11-19 DIAGNOSIS — Z124 Encounter for screening for malignant neoplasm of cervix: Secondary | ICD-10-CM | POA: Insufficient documentation

## 2011-11-22 ENCOUNTER — Ambulatory Visit (HOSPITAL_COMMUNITY)
Admission: RE | Admit: 2011-11-22 | Discharge: 2011-11-22 | Disposition: A | Discharge status: Home / Self Care | Payer: Medicare Other | Source: Ambulatory Visit | Attending: Adult Health | Admitting: Adult Health

## 2011-11-22 DIAGNOSIS — Z79899 Other long term (current) drug therapy: Secondary | ICD-10-CM | POA: Insufficient documentation

## 2011-11-22 DIAGNOSIS — Z13820 Encounter for screening for osteoporosis: Secondary | ICD-10-CM | POA: Insufficient documentation

## 2011-12-06 DIAGNOSIS — Z23 Encounter for immunization: Secondary | ICD-10-CM | POA: Diagnosis not present

## 2011-12-23 ENCOUNTER — Other Ambulatory Visit (HOSPITAL_COMMUNITY): Payer: Self-pay | Admitting: Pediatrics

## 2011-12-23 ENCOUNTER — Ambulatory Visit (HOSPITAL_COMMUNITY)
Admission: RE | Admit: 2011-12-23 | Discharge: 2011-12-23 | Disposition: A | Discharge status: Home / Self Care | Payer: Medicare Other | Source: Ambulatory Visit | Attending: Pediatrics | Admitting: Pediatrics

## 2011-12-23 DIAGNOSIS — R05 Cough: Secondary | ICD-10-CM | POA: Diagnosis not present

## 2011-12-23 DIAGNOSIS — R053 Chronic cough: Secondary | ICD-10-CM

## 2011-12-23 DIAGNOSIS — R059 Cough, unspecified: Secondary | ICD-10-CM | POA: Diagnosis not present

## 2011-12-23 DIAGNOSIS — IMO0001 Reserved for inherently not codable concepts without codable children: Secondary | ICD-10-CM | POA: Diagnosis not present

## 2012-01-23 DIAGNOSIS — R05 Cough: Secondary | ICD-10-CM | POA: Diagnosis not present

## 2012-01-23 DIAGNOSIS — R059 Cough, unspecified: Secondary | ICD-10-CM | POA: Diagnosis not present

## 2012-01-23 DIAGNOSIS — I10 Essential (primary) hypertension: Secondary | ICD-10-CM | POA: Diagnosis not present

## 2012-01-29 DIAGNOSIS — D485 Neoplasm of uncertain behavior of skin: Secondary | ICD-10-CM | POA: Diagnosis not present

## 2012-01-29 DIAGNOSIS — C44319 Basal cell carcinoma of skin of other parts of face: Secondary | ICD-10-CM | POA: Diagnosis not present

## 2012-02-27 DIAGNOSIS — J019 Acute sinusitis, unspecified: Secondary | ICD-10-CM | POA: Diagnosis not present

## 2012-03-03 DIAGNOSIS — I69998 Other sequelae following unspecified cerebrovascular disease: Secondary | ICD-10-CM | POA: Diagnosis not present

## 2012-03-03 DIAGNOSIS — R42 Dizziness and giddiness: Secondary | ICD-10-CM | POA: Diagnosis not present

## 2012-03-03 DIAGNOSIS — I10 Essential (primary) hypertension: Secondary | ICD-10-CM | POA: Diagnosis not present

## 2012-03-03 DIAGNOSIS — R110 Nausea: Secondary | ICD-10-CM | POA: Diagnosis not present

## 2012-03-23 DIAGNOSIS — Z85828 Personal history of other malignant neoplasm of skin: Secondary | ICD-10-CM | POA: Diagnosis not present

## 2012-03-23 DIAGNOSIS — D235 Other benign neoplasm of skin of trunk: Secondary | ICD-10-CM | POA: Diagnosis not present

## 2012-03-23 DIAGNOSIS — L408 Other psoriasis: Secondary | ICD-10-CM | POA: Diagnosis not present

## 2012-04-10 DIAGNOSIS — J210 Acute bronchiolitis due to respiratory syncytial virus: Secondary | ICD-10-CM | POA: Diagnosis not present

## 2012-04-10 DIAGNOSIS — J111 Influenza due to unidentified influenza virus with other respiratory manifestations: Secondary | ICD-10-CM | POA: Diagnosis not present

## 2012-04-17 DIAGNOSIS — J019 Acute sinusitis, unspecified: Secondary | ICD-10-CM | POA: Diagnosis not present

## 2012-04-17 DIAGNOSIS — IMO0001 Reserved for inherently not codable concepts without codable children: Secondary | ICD-10-CM | POA: Diagnosis not present

## 2012-05-11 DIAGNOSIS — I10 Essential (primary) hypertension: Secondary | ICD-10-CM | POA: Diagnosis not present

## 2012-06-10 DIAGNOSIS — L408 Other psoriasis: Secondary | ICD-10-CM | POA: Diagnosis not present

## 2012-06-10 DIAGNOSIS — T6391XA Toxic effect of contact with unspecified venomous animal, accidental (unintentional), initial encounter: Secondary | ICD-10-CM | POA: Diagnosis not present

## 2012-06-10 DIAGNOSIS — Z85828 Personal history of other malignant neoplasm of skin: Secondary | ICD-10-CM | POA: Diagnosis not present

## 2012-07-06 DIAGNOSIS — J40 Bronchitis, not specified as acute or chronic: Secondary | ICD-10-CM | POA: Diagnosis not present

## 2012-07-06 DIAGNOSIS — R05 Cough: Secondary | ICD-10-CM | POA: Diagnosis not present

## 2012-07-06 DIAGNOSIS — R059 Cough, unspecified: Secondary | ICD-10-CM | POA: Diagnosis not present

## 2012-07-15 ENCOUNTER — Other Ambulatory Visit: Payer: Self-pay | Admitting: Adult Health

## 2012-07-15 DIAGNOSIS — IMO0001 Reserved for inherently not codable concepts without codable children: Secondary | ICD-10-CM

## 2012-07-24 ENCOUNTER — Ambulatory Visit (HOSPITAL_COMMUNITY)
Admission: RE | Admit: 2012-07-24 | Discharge: 2012-07-24 | Disposition: A | Discharge status: Home / Self Care | Payer: Medicare Other | Source: Ambulatory Visit | Attending: Adult Health | Admitting: Adult Health

## 2012-07-24 DIAGNOSIS — Z1231 Encounter for screening mammogram for malignant neoplasm of breast: Secondary | ICD-10-CM | POA: Diagnosis not present

## 2012-07-24 DIAGNOSIS — IMO0001 Reserved for inherently not codable concepts without codable children: Secondary | ICD-10-CM

## 2012-07-27 ENCOUNTER — Other Ambulatory Visit: Payer: Self-pay

## 2012-07-27 DIAGNOSIS — R609 Edema, unspecified: Secondary | ICD-10-CM | POA: Diagnosis not present

## 2012-07-27 DIAGNOSIS — E039 Hypothyroidism, unspecified: Secondary | ICD-10-CM | POA: Diagnosis not present

## 2012-07-27 DIAGNOSIS — J449 Chronic obstructive pulmonary disease, unspecified: Secondary | ICD-10-CM

## 2012-08-05 ENCOUNTER — Ambulatory Visit (HOSPITAL_COMMUNITY)
Admission: RE | Admit: 2012-08-05 | Discharge: 2012-08-05 | Disposition: A | Discharge status: Home / Self Care | Payer: Medicare Other | Source: Ambulatory Visit | Attending: Pulmonary Disease | Admitting: Pulmonary Disease

## 2012-08-05 DIAGNOSIS — R0989 Other specified symptoms and signs involving the circulatory and respiratory systems: Secondary | ICD-10-CM | POA: Insufficient documentation

## 2012-08-05 DIAGNOSIS — J449 Chronic obstructive pulmonary disease, unspecified: Secondary | ICD-10-CM | POA: Diagnosis not present

## 2012-08-05 DIAGNOSIS — R0609 Other forms of dyspnea: Secondary | ICD-10-CM | POA: Diagnosis not present

## 2012-08-05 DIAGNOSIS — J4489 Other specified chronic obstructive pulmonary disease: Secondary | ICD-10-CM | POA: Insufficient documentation

## 2012-08-07 DIAGNOSIS — H2510 Age-related nuclear cataract, unspecified eye: Secondary | ICD-10-CM | POA: Diagnosis not present

## 2012-08-07 DIAGNOSIS — H52229 Regular astigmatism, unspecified eye: Secondary | ICD-10-CM | POA: Diagnosis not present

## 2012-08-07 DIAGNOSIS — H524 Presbyopia: Secondary | ICD-10-CM | POA: Diagnosis not present

## 2012-08-09 NOTE — Procedures (Signed)
NAMEAVERI, KILTY             ACCOUNT NO.:  [id number]  MEDICAL RECORD NO.:  [id number]  LOCATION:  RESP                          FACILITY:  [facility]  PHYSICIAN:  Gen Clagg L. Juanetta Gosling, M.D.DATE OF BIRTH:  1941-08-09  DATE OF PROCEDURE: DATE OF DISCHARGE:  08/05/2012                           PULMONARY FUNCTION TEST   Reason for pulmonary function testing is COPD. 1. Spirometry is normal. 2. Lung volumes are normal. 3. DLCO is moderately reduced and corrects somewhat when volume is     accounted for. 4. Airway resistance is elevated which may indicate air flow     obstruction. 5. This does not show definite COPD.     Gurveer Colucci L. Juanetta Gosling, M.D.     ELH/MEDQ  D:  08/08/2012  T:  08/09/2012  Job:  [id number]

## 2012-08-12 LAB — PULMONARY FUNCTION TEST

## 2012-08-21 DIAGNOSIS — Z23 Encounter for immunization: Secondary | ICD-10-CM | POA: Diagnosis not present

## 2012-08-26 DIAGNOSIS — E039 Hypothyroidism, unspecified: Secondary | ICD-10-CM | POA: Diagnosis not present

## 2012-08-26 DIAGNOSIS — M545 Low back pain, unspecified: Secondary | ICD-10-CM | POA: Diagnosis not present

## 2012-08-26 DIAGNOSIS — J441 Chronic obstructive pulmonary disease with (acute) exacerbation: Secondary | ICD-10-CM | POA: Diagnosis not present

## 2012-08-26 DIAGNOSIS — J449 Chronic obstructive pulmonary disease, unspecified: Secondary | ICD-10-CM | POA: Diagnosis not present

## 2012-10-01 ENCOUNTER — Ambulatory Visit (INDEPENDENT_AMBULATORY_CARE_PROVIDER_SITE_OTHER): Payer: Medicare Other | Admitting: Otolaryngology

## 2012-10-01 DIAGNOSIS — H903 Sensorineural hearing loss, bilateral: Secondary | ICD-10-CM

## 2012-10-13 DIAGNOSIS — I872 Venous insufficiency (chronic) (peripheral): Secondary | ICD-10-CM | POA: Diagnosis not present

## 2012-10-13 DIAGNOSIS — E039 Hypothyroidism, unspecified: Secondary | ICD-10-CM | POA: Diagnosis not present

## 2012-10-13 DIAGNOSIS — I10 Essential (primary) hypertension: Secondary | ICD-10-CM | POA: Diagnosis not present

## 2012-10-13 DIAGNOSIS — J449 Chronic obstructive pulmonary disease, unspecified: Secondary | ICD-10-CM | POA: Diagnosis not present

## 2012-11-10 DIAGNOSIS — Z85828 Personal history of other malignant neoplasm of skin: Secondary | ICD-10-CM | POA: Diagnosis not present

## 2012-11-10 DIAGNOSIS — D235 Other benign neoplasm of skin of trunk: Secondary | ICD-10-CM | POA: Diagnosis not present

## 2012-11-10 DIAGNOSIS — M7130 Other bursal cyst, unspecified site: Secondary | ICD-10-CM | POA: Diagnosis not present

## 2012-11-27 DIAGNOSIS — E039 Hypothyroidism, unspecified: Secondary | ICD-10-CM | POA: Diagnosis not present

## 2012-11-27 DIAGNOSIS — R5381 Other malaise: Secondary | ICD-10-CM | POA: Diagnosis not present

## 2012-11-27 DIAGNOSIS — I10 Essential (primary) hypertension: Secondary | ICD-10-CM | POA: Diagnosis not present

## 2012-11-27 DIAGNOSIS — N39498 Other specified urinary incontinence: Secondary | ICD-10-CM | POA: Diagnosis not present

## 2012-11-27 DIAGNOSIS — J449 Chronic obstructive pulmonary disease, unspecified: Secondary | ICD-10-CM | POA: Diagnosis not present

## 2012-11-27 DIAGNOSIS — R5383 Other fatigue: Secondary | ICD-10-CM | POA: Diagnosis not present

## 2012-12-01 DIAGNOSIS — M1990 Unspecified osteoarthritis, unspecified site: Secondary | ICD-10-CM | POA: Diagnosis not present

## 2012-12-01 DIAGNOSIS — J449 Chronic obstructive pulmonary disease, unspecified: Secondary | ICD-10-CM | POA: Diagnosis not present

## 2012-12-01 DIAGNOSIS — E039 Hypothyroidism, unspecified: Secondary | ICD-10-CM | POA: Diagnosis not present

## 2012-12-01 DIAGNOSIS — I10 Essential (primary) hypertension: Secondary | ICD-10-CM | POA: Diagnosis not present

## 2012-12-17 DIAGNOSIS — I998 Other disorder of circulatory system: Secondary | ICD-10-CM | POA: Diagnosis not present

## 2012-12-17 DIAGNOSIS — I10 Essential (primary) hypertension: Secondary | ICD-10-CM | POA: Diagnosis not present

## 2013-01-06 DIAGNOSIS — I10 Essential (primary) hypertension: Secondary | ICD-10-CM | POA: Diagnosis not present

## 2013-01-06 DIAGNOSIS — K222 Esophageal obstruction: Secondary | ICD-10-CM | POA: Diagnosis not present

## 2013-01-06 DIAGNOSIS — J449 Chronic obstructive pulmonary disease, unspecified: Secondary | ICD-10-CM | POA: Diagnosis not present

## 2013-01-26 DIAGNOSIS — L821 Other seborrheic keratosis: Secondary | ICD-10-CM | POA: Diagnosis not present

## 2013-02-11 ENCOUNTER — Ambulatory Visit (INDEPENDENT_AMBULATORY_CARE_PROVIDER_SITE_OTHER): Payer: Medicare Other

## 2013-02-11 ENCOUNTER — Ambulatory Visit (INDEPENDENT_AMBULATORY_CARE_PROVIDER_SITE_OTHER): Payer: Medicare Other | Admitting: Orthopedic Surgery

## 2013-02-11 ENCOUNTER — Encounter: Payer: Self-pay | Admitting: Orthopedic Surgery

## 2013-02-11 VITALS — Resp 16 | Ht 64.0 in | Wt 164.0 lb

## 2013-02-11 DIAGNOSIS — M76899 Other specified enthesopathies of unspecified lower limb, excluding foot: Secondary | ICD-10-CM | POA: Diagnosis not present

## 2013-02-11 DIAGNOSIS — M25552 Pain in left hip: Secondary | ICD-10-CM

## 2013-02-11 DIAGNOSIS — M7072 Other bursitis of hip, left hip: Secondary | ICD-10-CM

## 2013-02-11 DIAGNOSIS — M25559 Pain in unspecified hip: Secondary | ICD-10-CM

## 2013-02-11 DIAGNOSIS — M7070 Other bursitis of hip, unspecified hip: Secondary | ICD-10-CM | POA: Insufficient documentation

## 2013-02-11 NOTE — Patient Instructions (Addendum)
You have received a steroid shot. 15% of patients experience increased pain at the injection site with in the next 24 hours. This is best treated with ice and tylenol extra strength 2 tabs every 8 hours. If you are still having pain please call the office.    Bursitis of the hip Hip Bursitis Bursitis is a swelling and soreness (inflammation) of a fluid-filled sac (bursa). This sac overlies and protects the joints.  CAUSES   Injury.  Overuse of the muscles surrounding the joint.  Arthritis.  Gout.  Infection.  Cold weather.  Inadequate warm-up and conditioning prior to activities. The cause may not be known.  SYMPTOMS   Mild to severe irritation.  Tenderness and swelling over the outside of the hip.  Pain with motion of the hip.  If the bursa becomes infected, a fever may be present. Redness, tenderness, and warmth will develop over the hip. Symptoms usually lessen in 3 to 4 weeks with treatment, but can come back. TREATMENT If conservative treatment does not work, your caregiver may advise draining the bursa and injecting cortisone into the area. This may speed up the healing process. This may also be used as an initial treatment of choice. HOME CARE INSTRUCTIONS   Apply ice to the affected area for 15 to 20 minutes every 3 to 4 hours while awake for the first 2 days. Put the ice in a plastic bag and place a towel between the bag of ice and your skin.  Rest the painful joint as much as possible, but continue to put the joint through a normal range of motion at least 4 times per day. When the pain lessens, begin normal, slow movements and usual activities to help prevent stiffness of the hip.  Only take over-the-counter or prescription medicines for pain, discomfort, or fever as directed by your caregiver.  Use crutches to limit weight bearing on the hip joint, if advised.  Elevate your painful hip to reduce swelling. Use pillows for propping and cushioning your legs and  hips.  Gentle massage may provide comfort and decrease swelling. SEEK IMMEDIATE MEDICAL CARE IF:   Your pain increases even during treatment, or you are not improving.  You have a fever.  You have heat and inflammation over the involved bursa.  You have any other questions or concerns. MAKE SURE YOU:   Understand these instructions.  Will watch your condition.  Will get help right away if you are not doing well or get worse. Document Released: 05/10/2002 Document Revised: 02/10/2012 Document Reviewed: 12/07/2008 [facility] Patient Information 2013 [place], [place].

## 2013-02-11 NOTE — Progress Notes (Signed)
Patient ID: SAVANHA ISLAND, female   DOB: 01-30-1941, 72 y.o.   MRN: [id number] Chief Complaint  Patient presents with  . Hip Pain    Left hip pain, no injury.    History  72 years old pain left hip since February 1 no trauma sudden onset. Complains of throbbing pain changes in intensity comes and goes worse with walking standing or bearing weight better with sitting lying down or placing a heating pad denies back pain denies numbness tingling or radicular symptoms denies groin pain  Review of systems she reports peripheral neuropathy-like symptoms in her feet at night she is chronic venous stasis disease has some numbness and tingling in her feet related to that she complains of excessive thirst and urination.  Remaining review of systems negative  Resp 16  Ht 5\' 4"  (1.626 m)  Wt 164 lb (74.39 kg)  BMI 28.14 kg/m2 General appearance is normal, the patient is alert and oriented x3 with normal mood and affect. Small to medium build Ambulation normal   Left hip Tenderness is noted over the left greater trochanter back is nontender Hip full range of motion Hip joint stability normal Motor exam normal Skin normal  Right hip range of motion strength stability normal skin normal  Distal pulses intact venous stasis disease bilaterally mild pitting edema Normal sensation    Past Medical History  Diagnosis Date  . COPD (chronic obstructive pulmonary disease)   . High blood pressure    Hip pain, left - Plan: DG Pelvis 1-2 Views, DG Lumbar Spine 2-3 Views  Hip bursitis, left   Our recommendation is for injection of the greater trochanteric bursa  Verbal consent was obtained timeout was completed Left hip prepped with alcohol and ethyl chloride Medications injected 40 mg of Depo-Medrol and 3 cc 1% lidocaine  Direct lateral approach after sterile prep and drape and anesthesia  Left hip was injected  No complications

## 2013-03-18 DIAGNOSIS — T148XXA Other injury of unspecified body region, initial encounter: Secondary | ICD-10-CM | POA: Diagnosis not present

## 2013-03-25 DIAGNOSIS — I10 Essential (primary) hypertension: Secondary | ICD-10-CM | POA: Diagnosis not present

## 2013-03-25 DIAGNOSIS — T148XXA Other injury of unspecified body region, initial encounter: Secondary | ICD-10-CM | POA: Diagnosis not present

## 2013-04-15 DIAGNOSIS — G589 Mononeuropathy, unspecified: Secondary | ICD-10-CM | POA: Diagnosis not present

## 2013-04-15 DIAGNOSIS — R5381 Other malaise: Secondary | ICD-10-CM | POA: Diagnosis not present

## 2013-04-15 DIAGNOSIS — E039 Hypothyroidism, unspecified: Secondary | ICD-10-CM | POA: Diagnosis not present

## 2013-04-15 DIAGNOSIS — E559 Vitamin D deficiency, unspecified: Secondary | ICD-10-CM | POA: Diagnosis not present

## 2013-04-15 DIAGNOSIS — I10 Essential (primary) hypertension: Secondary | ICD-10-CM | POA: Diagnosis not present

## 2013-04-29 DIAGNOSIS — M549 Dorsalgia, unspecified: Secondary | ICD-10-CM | POA: Diagnosis not present

## 2013-04-29 DIAGNOSIS — M62838 Other muscle spasm: Secondary | ICD-10-CM | POA: Diagnosis not present

## 2013-05-05 DIAGNOSIS — Z85828 Personal history of other malignant neoplasm of skin: Secondary | ICD-10-CM | POA: Diagnosis not present

## 2013-05-05 DIAGNOSIS — L255 Unspecified contact dermatitis due to plants, except food: Secondary | ICD-10-CM | POA: Diagnosis not present

## 2013-06-11 DIAGNOSIS — M25559 Pain in unspecified hip: Secondary | ICD-10-CM | POA: Diagnosis not present

## 2013-06-15 ENCOUNTER — Encounter: Payer: Self-pay | Admitting: Orthopedic Surgery

## 2013-06-15 ENCOUNTER — Other Ambulatory Visit: Payer: Self-pay | Admitting: Adult Health

## 2013-06-15 ENCOUNTER — Ambulatory Visit (INDEPENDENT_AMBULATORY_CARE_PROVIDER_SITE_OTHER): Payer: Medicare Other | Admitting: Orthopedic Surgery

## 2013-06-15 VITALS — BP 150/98 | Ht 64.0 in | Wt 164.0 lb

## 2013-06-15 DIAGNOSIS — M76899 Other specified enthesopathies of unspecified lower limb, excluding foot: Secondary | ICD-10-CM | POA: Diagnosis not present

## 2013-06-15 DIAGNOSIS — Z139 Encounter for screening, unspecified: Secondary | ICD-10-CM

## 2013-06-15 DIAGNOSIS — M7072 Other bursitis of hip, left hip: Secondary | ICD-10-CM

## 2013-06-15 NOTE — Progress Notes (Signed)
Patient ID: [name], female   DOB: 17-Nov-1941, 72 y.o.   MRN: [id number] Chief Complaint  Patient presents with  . Follow-up    Recheck left hip.   History this patient had injection left hip for bursitis it lasted about 6 months. She complains of pain over the left hip again it radiated into the groin. No catching locking giving way denies back pain  Review of systems otherwise normal  Review of systems for 02/11/2013 no change  Exam shows well-developed well-nourished female grooming and hygiene are intact she is alert and oriented x3 mood and affect are normal she ambulates normally with no cane and a limp she has tenderness to record over the greater trochanter no pain with internal rotation or flexion of the hip distal neurovascular exam is intact no atrophy is noted  Impression bursitis left hip  Repeat injection  Previous x-ray showed no evidence of arthritis in either hip  Diagnosis bursitis left hip left hip injection  Hip Injection Verbal consent was obtained  Timeout procedure was completed for each hip  Alcohol was used as a prep with ethyl chloride as an anesthetizing agent  Using a 22-gauge spinal needle the greater trochanter bursa was entered and injected with Depo-Medrol 40 mg.  We also diluted out with 3 cc 1% plain lidocaine

## 2013-06-29 ENCOUNTER — Ambulatory Visit: Payer: Medicare Other | Admitting: Orthopedic Surgery

## 2013-07-07 ENCOUNTER — Other Ambulatory Visit: Payer: Self-pay

## 2013-07-26 ENCOUNTER — Ambulatory Visit (HOSPITAL_COMMUNITY)
Admission: RE | Admit: 2013-07-26 | Discharge: 2013-07-26 | Disposition: A | Discharge status: Home / Self Care | Payer: Medicare Other | Source: Ambulatory Visit | Attending: Adult Health | Admitting: Adult Health

## 2013-07-26 DIAGNOSIS — Z139 Encounter for screening, unspecified: Secondary | ICD-10-CM

## 2013-07-26 DIAGNOSIS — Z1231 Encounter for screening mammogram for malignant neoplasm of breast: Secondary | ICD-10-CM | POA: Diagnosis not present

## 2013-08-06 ENCOUNTER — Other Ambulatory Visit: Payer: Self-pay | Admitting: *Deleted

## 2013-08-06 DIAGNOSIS — M79609 Pain in unspecified limb: Secondary | ICD-10-CM

## 2013-08-09 DIAGNOSIS — H52229 Regular astigmatism, unspecified eye: Secondary | ICD-10-CM | POA: Diagnosis not present

## 2013-08-09 DIAGNOSIS — H524 Presbyopia: Secondary | ICD-10-CM | POA: Diagnosis not present

## 2013-08-09 DIAGNOSIS — H2510 Age-related nuclear cataract, unspecified eye: Secondary | ICD-10-CM | POA: Diagnosis not present

## 2013-08-09 DIAGNOSIS — H5210 Myopia, unspecified eye: Secondary | ICD-10-CM | POA: Diagnosis not present

## 2013-08-14 DIAGNOSIS — Z23 Encounter for immunization: Secondary | ICD-10-CM | POA: Diagnosis not present

## 2013-08-25 DIAGNOSIS — R110 Nausea: Secondary | ICD-10-CM | POA: Diagnosis not present

## 2013-08-25 DIAGNOSIS — R109 Unspecified abdominal pain: Secondary | ICD-10-CM | POA: Diagnosis not present

## 2013-08-25 DIAGNOSIS — R10819 Abdominal tenderness, unspecified site: Secondary | ICD-10-CM | POA: Diagnosis not present

## 2013-08-25 DIAGNOSIS — R300 Dysuria: Secondary | ICD-10-CM | POA: Diagnosis not present

## 2013-08-31 DIAGNOSIS — L820 Inflamed seborrheic keratosis: Secondary | ICD-10-CM | POA: Diagnosis not present

## 2013-08-31 DIAGNOSIS — Z85828 Personal history of other malignant neoplasm of skin: Secondary | ICD-10-CM | POA: Diagnosis not present

## 2013-08-31 DIAGNOSIS — L259 Unspecified contact dermatitis, unspecified cause: Secondary | ICD-10-CM | POA: Diagnosis not present

## 2013-09-03 ENCOUNTER — Encounter: Payer: Medicare Other | Admitting: Vascular Surgery

## 2013-09-16 ENCOUNTER — Other Ambulatory Visit: Payer: Self-pay | Admitting: Vascular Surgery

## 2013-09-16 DIAGNOSIS — M79609 Pain in unspecified limb: Secondary | ICD-10-CM

## 2013-10-01 ENCOUNTER — Encounter: Payer: Self-pay | Admitting: Surgery

## 2013-10-04 ENCOUNTER — Ambulatory Visit (HOSPITAL_COMMUNITY)
Admission: RE | Admit: 2013-10-04 | Discharge: 2013-10-04 | Disposition: A | Discharge status: Home / Self Care | Payer: Medicare Other | Source: Ambulatory Visit | Attending: Surgery | Admitting: Surgery

## 2013-10-04 ENCOUNTER — Other Ambulatory Visit: Payer: Self-pay | Admitting: *Deleted

## 2013-10-04 ENCOUNTER — Ambulatory Visit (INDEPENDENT_AMBULATORY_CARE_PROVIDER_SITE_OTHER)
Admission: RE | Admit: 2013-10-04 | Discharge: 2013-10-04 | Disposition: A | Discharge status: Home / Self Care | Payer: Medicare Other | Source: Ambulatory Visit | Attending: Surgery | Admitting: Surgery

## 2013-10-04 ENCOUNTER — Ambulatory Visit (INDEPENDENT_AMBULATORY_CARE_PROVIDER_SITE_OTHER): Payer: Medicare Other | Admitting: Surgery

## 2013-10-04 ENCOUNTER — Encounter: Payer: Self-pay | Admitting: Surgery

## 2013-10-04 VITALS — BP 132/91 | HR 71 | Ht 64.0 in | Wt 163.0 lb

## 2013-10-04 DIAGNOSIS — M79609 Pain in unspecified limb: Secondary | ICD-10-CM | POA: Insufficient documentation

## 2013-10-04 NOTE — Progress Notes (Signed)
[facility] [place]   Patient name: [name] MRN: [id number] DOB: 04-10-1941 Sex: female   Referred by: Dr. Margo Aye  Reason for referral:  Chief Complaint  Patient presents with  . New Evaluation    bilateral leg pain and tingling     HISTORY OF PRESENT ILLNESS: The patient comes in today for evaluation of bilateral leg pain.  She states that she has been having problems for several years.  She describes this as mainly swelling in her ankles and her feet.  She also complained of numbness in her feet.  She does wear compression stockings that didn't help.  She complains of trouble standing up.  She also complains of cramping at night.  She suffers from hypertension which is medically managed.  She has COPD and is a former smoker  Past Medical History  Diagnosis Date  . COPD (chronic obstructive pulmonary disease)   . High blood pressure   . Anemia     Past Surgical History  Procedure Laterality Date  . Cholecystectomy    . Tubal ligation      History   Social History  . Marital Status: Married    Spouse Name: N/A    Number of Children: N/A  . Years of Education: N/A   Occupational History  . Not on file.   Social History Main Topics  . Smoking status: Former Smoker    Types: Cigarettes    Quit date: 12/02/1997  . Smokeless tobacco: Not on file  . Alcohol Use: 0.6 oz/week    1 Glasses of wine per week  . Drug Use: No  . Sexual Activity: Not on file   Other Topics Concern  . Not on file   Social History Narrative  . No narrative on file    Family History  Problem Relation Age of Onset  . Hypertension Mother   . Varicose Veins Father     Allergies as of 10/04/2013 - Review Complete 10/04/2013  Allergen Reaction Noted  . Augmentin [amoxicillin-pot clavulanate]  10/04/2013  . Ace inhibitors    . Entex la      Current Outpatient Prescriptions on File Prior to Visit  Medication Sig Dispense Refill  . ALPRAZolam (XANAX)  0.25 MG tablet       . amLODipine (NORVASC) 2.5 MG tablet       . aspirin 81 MG tablet Take 81 mg by mouth daily.      Marland Kitchen EVISTA 60 MG tablet       . glucosamine-chondroitin 500-400 MG tablet Take 1 tablet by mouth 3 (three) times daily.      Marland Kitchen levothyroxine (SYNTHROID, LEVOTHROID) 125 MCG tablet Take 62.5 mcg by mouth daily.       . Multiple Vitamins-Minerals (CENTRUM SILVER PO) Take by mouth.      . SPIRIVA HANDIHALER 18 MCG inhalation capsule       . oxybutynin (DITROPAN-XL) 10 MG 24 hr tablet       . sulfamethoxazole-trimethoprim (BACTRIM DS) 800-160 MG per tablet        No current facility-administered medications on file prior to visit.     REVIEW OF SYSTEMS: Cardiovascular: No chest pain, chest pressure, palpitations, positive for pain in her feet when lying flat (numbness) Pulmonary: No productive cough, asthma or wheezing. Neurologic: No weakness, paresthesias, aphasia, or amaurosis. No dizziness. Hematologic: No bleeding problems or clotting disorders. Musculoskeletal: No joint pain or joint swelling. Gastrointestinal: No blood in stool or hematemesis Genitourinary: No dysuria  or hematuria. Psychiatric:: No history of major depression. Integumentary: No rashes or ulcers. Constitutional: No fever or chills.  PHYSICAL EXAMINATION: General: The patient appears their stated age.  Vital signs are BP 132/91  Pulse 71  Ht 5\' 4"  (1.626 m)  Wt 163 lb (73.936 kg)  BMI 27.97 kg/m2  SpO2 99% HEENT:  No gross abnormalities Pulmonary: Respirations are non-labored Abdomen: Soft and non-tender  Musculoskeletal: There are no major deformities.   Neurologic: No focal weakness or paresthesias are detected, Skin: There are no ulcer or rashes noted. Psychiatric: The patient has normal affect. Cardiovascular: There is a regular rate and rhythm without significant murmur appreciated.  Palpable pedal pulses bilaterally.  The patient does have prominent varicosities bilaterally  Diagnostic  Studies: Arterial ultrasound was performed which was within normal limits   Assessment:  Bilateral leg pain Plan: After examining the patient and realizing that she has bilateral varicosities with leg swelling, felt that the most likely diagnosis is venous insufficiency.  For that reason I sent her for venous ultrasound.  The patient's venous insufficiency ultrasound was performed.  This reveals deep reflux on the right as well as right great saphenous reflux with maximum diameter of 0.74 cm.  There was also deep reflux on the left with saphenofemoral junction reflux.  I discussed the findings with the patient.  She was placed in 20-30 mm thigh-high compression stockings they'll return in 3 months to discuss the possibility of laser ablation of the right great saphenous vein for symptomatic relief.  She will also consider stab phlebectomy vs. sclerotherapy of her varicosities.    Jorge Ny, M.D. [facility] Office: [phone] Pager:  [phone]

## 2013-10-07 ENCOUNTER — Other Ambulatory Visit: Payer: Self-pay

## 2013-11-11 DIAGNOSIS — J449 Chronic obstructive pulmonary disease, unspecified: Secondary | ICD-10-CM | POA: Diagnosis not present

## 2013-11-11 DIAGNOSIS — I10 Essential (primary) hypertension: Secondary | ICD-10-CM | POA: Diagnosis not present

## 2013-11-11 DIAGNOSIS — E039 Hypothyroidism, unspecified: Secondary | ICD-10-CM | POA: Diagnosis not present

## 2013-11-11 DIAGNOSIS — I739 Peripheral vascular disease, unspecified: Secondary | ICD-10-CM | POA: Diagnosis not present

## 2013-11-11 DIAGNOSIS — R7301 Impaired fasting glucose: Secondary | ICD-10-CM | POA: Diagnosis not present

## 2013-11-11 DIAGNOSIS — D539 Nutritional anemia, unspecified: Secondary | ICD-10-CM | POA: Diagnosis not present

## 2013-11-23 ENCOUNTER — Encounter: Payer: Self-pay | Admitting: Adult Health

## 2013-11-23 ENCOUNTER — Ambulatory Visit (INDEPENDENT_AMBULATORY_CARE_PROVIDER_SITE_OTHER): Payer: Medicare Other | Admitting: Adult Health

## 2013-11-23 ENCOUNTER — Other Ambulatory Visit (HOSPITAL_COMMUNITY)
Admission: RE | Admit: 2013-11-23 | Discharge: 2013-11-23 | Disposition: A | Discharge status: Home / Self Care | Payer: Medicare Other | Source: Ambulatory Visit | Attending: Adult Health | Admitting: Adult Health

## 2013-11-23 VITALS — BP 120/80 | HR 72 | Ht 63.0 in | Wt 167.0 lb

## 2013-11-23 DIAGNOSIS — R42 Dizziness and giddiness: Secondary | ICD-10-CM | POA: Diagnosis not present

## 2013-11-23 DIAGNOSIS — Z124 Encounter for screening for malignant neoplasm of cervix: Secondary | ICD-10-CM | POA: Diagnosis not present

## 2013-11-23 DIAGNOSIS — Z1151 Encounter for screening for human papillomavirus (HPV): Secondary | ICD-10-CM | POA: Diagnosis not present

## 2013-11-23 DIAGNOSIS — D485 Neoplasm of uncertain behavior of skin: Secondary | ICD-10-CM | POA: Diagnosis not present

## 2013-11-23 DIAGNOSIS — Z01419 Encounter for gynecological examination (general) (routine) without abnormal findings: Secondary | ICD-10-CM

## 2013-11-23 DIAGNOSIS — L910 Hypertrophic scar: Secondary | ICD-10-CM | POA: Diagnosis not present

## 2013-11-23 HISTORY — DX: Dizziness and giddiness: R42

## 2013-11-23 MED ORDER — MECLIZINE HCL 25 MG PO TABS: 25.0000 mg | ORAL_TABLET | Freq: Three times a day (TID) | ORAL | Status: DC | PRN

## 2013-11-23 NOTE — Progress Notes (Signed)
Patient ID: [name], female   DOB: 10/11/1941, 72 y.o.   MRN: [id number] History of Present Illness: [name] is a 72 year old white female,married in for a pap and physical.She is having some dizziness this am, has had in past.Has anitvert but expired.   Current Medications, Allergies, Past Medical History, Past Surgical History, Family History and Social History were reviewed in [employer] record.   Past Medical History  Diagnosis Date  . COPD (chronic obstructive pulmonary disease)   . High blood pressure   . Anemia   . Vertigo 11/23/2013  . Thyroid disease    Past Surgical History  Procedure Laterality Date  . Cholecystectomy    . Tubal ligation    Current outpatient prescriptions:Albuterol Sulfate (VENTOLIN HFA IN), Inhale into the lungs as needed., Disp: , Rfl: ;  ALPRAZolam (XANAX) 0.25 MG tablet, , Disp: , Rfl: ;  amLODipine (NORVASC) 2.5 MG tablet, , Disp: , Rfl: ;  aspirin 81 MG tablet, Take 81 mg by mouth daily., Disp: , Rfl: ;  EVISTA 60 MG tablet, , Disp: , Rfl: ;  glucosamine-chondroitin 500-400 MG tablet, Take 1 tablet by mouth 3 (three) times daily., Disp: , Rfl:  hydrochlorothiazide (HYDRODIURIL) 25 MG tablet, Take 1 tablet by mouth daily., Disp: , Rfl: ;  ibuprofen (ADVIL,MOTRIN) 800 MG tablet, Take 800 mg by mouth every 8 (eight) hours as needed for pain., Disp: , Rfl: ;  levothyroxine (SYNTHROID, LEVOTHROID) 125 MCG tablet, Take 62.5 mcg by mouth daily. , Disp: , Rfl: ;  losartan (COZAAR) 50 MG tablet, Take 1 tablet by mouth daily., Disp: , Rfl:  Multiple Vitamins-Minerals (CENTRUM SILVER PO), Take by mouth., Disp: , Rfl: ;  SPIRIVA HANDIHALER 18 MCG inhalation capsule, , Disp: , Rfl: ;  traMADol (ULTRAM) 50 MG tablet, Take 1 tablet by mouth as needed., Disp: , Rfl: ;  meclizine (ANTIVERT) 25 MG tablet, Take 1 tablet (25 mg total) by mouth 3 (three) times daily as needed for dizziness., Disp: 30 tablet, Rfl: 1;  oxybutynin (DITROPAN-XL) 10 MG 24  hr tablet, , Disp: , Rfl:   Review of Systems: Patient denies any headaches, blurred vision, shortness of breath, chest pain, abdominal pain, problems with urination, or intercourse. Has some constipation and vaginal dryness, no joint pain,no mood swings.    Physical Exam:BP 120/80  Pulse 72  Ht 5\' 3"  (1.6 m)  Wt 167 lb (75.751 kg)  BMI 29.59 kg/m2 General:  Well developed, well nourished, no acute distress Skin:  Warm and dry Neck:  Midline trachea, normal thyroid,no carotid bruits Lungs; Clear to auscultation bilaterally Breast:  No dominant palpable mass, retraction, or nipple discharge Cardiovascular: Regular rate and rhythm Abdomen:  Soft, non tender, no hepatosplenomegaly Pelvic:  External genitalia is normal in appearance.  The vagina is normal in appearance for age.               The cervix is atrophic,pap performed with HPV.Marland Kitchen  Uterus is felt to be normal size, shape, and contour.  No  adnexal masses or tenderness noted. Rectal: Good sphincter tone, no polyps, or hemorrhoids felt.  Hemoccult negative. Extremities:  No swelling noted Psych:  No mood changes, alert and cooperative, seems happy,husband has MCI   Impression: Yearly gyn exam Vertigo    Plan: Physical in 2 years Mammogram yearly Labs with PCP Rx antivert 25 mg #30 1 every 8 hours prn with 1 refill Follow up with Dr Margo Aye Cheyenne Adas for vaginal moisture

## 2013-11-23 NOTE — Patient Instructions (Addendum)
Physical in 2 years Mammogram yearly  Try mira lax,increase water Try luvena for vaginal dryness Follow up with Dr Daryel Gerald with PCP Constipation, Adult Constipation is when a person has fewer than 3 bowel movements a week; has difficulty having a bowel movement; or has stools that are dry, hard, or larger than normal. As people grow older, constipation is more common. If you try to fix constipation with medicines that make you have a bowel movement (laxatives), the problem may get worse. Long-term laxative use may cause the muscles of the colon to become weak. A low-fiber diet, not taking in enough fluids, and taking certain medicines may make constipation worse. CAUSES  Certain medicines, such as antidepressants, pain medicine, iron supplements, antacids, and water pills.  Certain diseases, such as diabetes, irritable bowel syndrome (IBS), thyroid disease, or depression.  Not drinking enough water.  Not eating enough fiber-rich foods.  Stress or travel. Lack of physical activity or exercise. Not going to the restroom when there is the urge to have a bowel movement. Ignoring the urge to have a bowel movement. Using laxatives too much. SYMPTOMS  Having fewer than 3 bowel movements a week.  Straining to have a bowel movement.  Having hard, dry, or larger than normal stools.  Feeling full or bloated.  Pain in the lower abdomen. Not feeling relief after having a bowel movement. DIAGNOSIS  Your caregiver will take a medical history and perform a physical exam. Further testing may be done for severe constipation. Some tests may include:  A barium enema X-ray to examine your rectum, colon, and sometimes, your small intestine. A sigmoidoscopy to examine your lower colon. A colonoscopy to examine your entire colon. TREATMENT  Treatment will depend on the severity of your constipation and what is causing it. Some dietary treatments include drinking more fluids and eating more  fiber-rich foods. Lifestyle treatments may include regular exercise. If these diet and lifestyle recommendations do not help, your caregiver may recommend taking over-the-counter laxative medicines to help you have bowel movements. Prescription medicines may be prescribed if over-the-counter medicines do not work.  HOME CARE INSTRUCTIONS  Increase dietary fiber in your diet, such as fruits, vegetables, whole grains, and beans. Limit high-fat and processed sugars in your diet, such as [place] fries, hamburgers, cookies, candies, and soda.  A fiber supplement may be added to your diet if you cannot get enough fiber from foods.  Drink enough fluids to keep your urine clear or pale yellow.  Exercise regularly or as directed by your caregiver.  Go to the restroom when you have the urge to go. Do not hold it. Only take medicines as directed by your caregiver. Do not take other medicines for constipation without talking to your caregiver first. SEEK IMMEDIATE MEDICAL CARE IF:  You have bright red blood in your stool.  Your constipation lasts for more than 4 days or gets worse.  You have abdominal or rectal pain.  You have thin, pencil-like stools. You have unexplained weight loss. MAKE SURE YOU:  Understand these instructions. Will watch your condition. Will get help right away if you are not doing well or get worse. Document Released: 08/16/2004 Document Revised: 02/10/2012 Document Reviewed: 10/22/2011 [facility] Patient Information 2014 [place], [place]. Vertigo Vertigo means you feel like you or your surroundings are moving when they are not. Vertigo can be dangerous if it occurs when you are at work, driving, or performing difficult activities.  CAUSES  Vertigo occurs when there is a conflict of  signals sent to your brain from the visual and sensory systems in your body. There are many different causes of vertigo, including:  Infections, especially in the inner ear.  A bad reaction to a  drug or misuse of alcohol and medicines.  Withdrawal from drugs or alcohol.  Rapidly changing positions, such as lying down or rolling over in bed.  A migraine headache.  Decreased blood flow to the brain.  Increased pressure in the brain from a head injury, infection, tumor, or bleeding. SYMPTOMS  You may feel as though the world is spinning around or you are falling to the ground. Because your balance is upset, vertigo can cause nausea and vomiting. You may have involuntary eye movements (nystagmus). DIAGNOSIS  Vertigo is usually diagnosed by physical exam. If the cause of your vertigo is unknown, your caregiver may perform imaging tests, such as an MRI scan (magnetic resonance imaging). TREATMENT  Most cases of vertigo resolve on their own, without treatment. Depending on the cause, your caregiver may prescribe certain medicines. If your vertigo is related to body position issues, your caregiver may recommend movements or procedures to correct the problem. In rare cases, if your vertigo is caused by certain inner ear problems, you may need surgery. HOME CARE INSTRUCTIONS   Follow your caregiver's instructions.  Avoid driving.  Avoid operating heavy machinery.  Avoid performing any tasks that would be dangerous to you or others during a vertigo episode.  Tell your caregiver if you notice that certain medicines seem to be causing your vertigo. Some of the medicines used to treat vertigo episodes can actually make them worse in some people. SEEK IMMEDIATE MEDICAL CARE IF:   Your medicines do not relieve your vertigo or are making it worse.  You develop problems with talking, walking, weakness, or using your arms, hands, or legs.  You develop severe headaches.  Your nausea or vomiting continues or gets worse.  You develop visual changes.  A family member notices behavioral changes.  Your condition gets worse. MAKE SURE YOU:  Understand these instructions.  Will watch  your condition.  Will get help right away if you are not doing well or get worse. Document Released: 08/28/2005 Document Revised: 02/10/2012 Document Reviewed: 06/06/2011 [facility] Patient Information 2014 [place], [place].

## 2013-12-16 DIAGNOSIS — R070 Pain in throat: Secondary | ICD-10-CM | POA: Diagnosis not present

## 2014-01-11 ENCOUNTER — Ambulatory Visit: Payer: Medicare Other | Admitting: Vascular Surgery

## 2014-01-17 ENCOUNTER — Encounter: Payer: Self-pay | Admitting: Obstetrics & Gynecology

## 2014-01-17 ENCOUNTER — Other Ambulatory Visit: Payer: Self-pay | Admitting: Obstetrics & Gynecology

## 2014-01-17 ENCOUNTER — Ambulatory Visit (INDEPENDENT_AMBULATORY_CARE_PROVIDER_SITE_OTHER): Payer: Medicare Other | Admitting: Obstetrics & Gynecology

## 2014-01-17 ENCOUNTER — Other Ambulatory Visit (INDEPENDENT_AMBULATORY_CARE_PROVIDER_SITE_OTHER): Payer: Medicare Other

## 2014-01-17 VITALS — BP 140/90 | Ht 64.0 in | Wt 163.0 lb

## 2014-01-17 DIAGNOSIS — N949 Unspecified condition associated with female genital organs and menstrual cycle: Secondary | ICD-10-CM

## 2014-01-17 DIAGNOSIS — R1031 Right lower quadrant pain: Secondary | ICD-10-CM | POA: Diagnosis not present

## 2014-01-17 NOTE — Progress Notes (Signed)
Patient ID: [name], female   DOB: 1941-09-25, 73 y.o.   MRN: [id number]

## 2014-01-18 ENCOUNTER — Ambulatory Visit: Payer: Medicare Other | Admitting: Vascular Surgery

## 2014-01-28 ENCOUNTER — Encounter: Payer: Self-pay | Admitting: Vascular Surgery

## 2014-01-31 ENCOUNTER — Ambulatory Visit: Payer: Medicare Other | Admitting: Vascular Surgery

## 2014-01-31 DIAGNOSIS — J449 Chronic obstructive pulmonary disease, unspecified: Secondary | ICD-10-CM | POA: Diagnosis not present

## 2014-01-31 DIAGNOSIS — J069 Acute upper respiratory infection, unspecified: Secondary | ICD-10-CM | POA: Diagnosis not present

## 2014-02-01 ENCOUNTER — Encounter (HOSPITAL_COMMUNITY): Payer: Self-pay | Admitting: Emergency Medicine

## 2014-02-01 ENCOUNTER — Ambulatory Visit: Payer: Medicare Other | Admitting: Vascular Surgery

## 2014-02-01 ENCOUNTER — Emergency Department (HOSPITAL_COMMUNITY)
Admission: EM | Admit: 2014-02-01 | Discharge: 2014-02-01 | Disposition: A | Discharge status: Home / Self Care | Payer: Medicare Other | Attending: Emergency Medicine | Admitting: Emergency Medicine

## 2014-02-01 DIAGNOSIS — J441 Chronic obstructive pulmonary disease with (acute) exacerbation: Secondary | ICD-10-CM | POA: Diagnosis not present

## 2014-02-01 DIAGNOSIS — I10 Essential (primary) hypertension: Secondary | ICD-10-CM | POA: Diagnosis not present

## 2014-02-01 DIAGNOSIS — J40 Bronchitis, not specified as acute or chronic: Secondary | ICD-10-CM

## 2014-02-01 DIAGNOSIS — Z862 Personal history of diseases of the blood and blood-forming organs and certain disorders involving the immune mechanism: Secondary | ICD-10-CM | POA: Insufficient documentation

## 2014-02-01 DIAGNOSIS — J209 Acute bronchitis, unspecified: Secondary | ICD-10-CM | POA: Diagnosis not present

## 2014-02-01 DIAGNOSIS — E079 Disorder of thyroid, unspecified: Secondary | ICD-10-CM | POA: Insufficient documentation

## 2014-02-01 DIAGNOSIS — Z7982 Long term (current) use of aspirin: Secondary | ICD-10-CM | POA: Diagnosis not present

## 2014-02-01 DIAGNOSIS — Z87891 Personal history of nicotine dependence: Secondary | ICD-10-CM | POA: Diagnosis not present

## 2014-02-01 MED ORDER — HYDROCODONE-HOMATROPINE 5-1.5 MG/5ML PO SYRP: 5.0000 mL | ORAL_SOLUTION | Freq: Four times a day (QID) | ORAL | Status: DC | PRN

## 2014-02-01 MED ORDER — HYDROCODONE-HOMATROPINE 5-1.5 MG/5ML PO SYRP
5.0000 mL | ORAL_SOLUTION | Freq: Once | ORAL | Status: AC
  Administered 2014-02-01: 5 mL via ORAL
  Filled 2014-02-01: qty 5

## 2014-02-01 MED ORDER — PREDNISONE 20 MG PO TABS: 40.0000 mg | ORAL_TABLET | Freq: Every day | ORAL | Status: DC

## 2014-02-01 MED ORDER — PREDNISONE 20 MG PO TABS
40.0000 mg | ORAL_TABLET | Freq: Every day | ORAL | Status: DC
  Administered 2014-02-01: 40 mg via ORAL
  Filled 2014-02-01: qty 2

## 2014-02-01 NOTE — ED Provider Notes (Signed)
CSN: [id number]     Arrival date & time 02/01/14  2124 History  This chart was scribed for Charles B. Karle Starch, MD by Anastasia Pall, ED Scribe. This patient was seen in room APA05/APA05 and the patient's care was started at 11:00 PM.    Chief Complaint  Patient presents with  . Sore Throat   (Consider location/radiation/quality/duration/timing/severity/associated sxs/prior Treatment) The history is provided by the patient. No language interpreter was used.   HPI Comments: [name] is a 73 y.o. female with h/o COPD, who presents to the Emergency Department complaining of constant sore throat, onset 3 days ago. She reports associated cough, initially productive, but progressively has gotten more dry, onset 2 days ago. She reports SOB this morning, having to use her inhaler, as well as an intermittent fever. She takes Spiriva every morning, so denies having to use inhaler regularly. She was seen by Dr Nevada Crane yesterday, started on Zithromax. Advised to take Ibuprofen, which she has taken that has helped relieve her fever temporarily. She has also taken Tessalon pearls and lozenges. She denies any other associated symptoms.   PCP - Delphina Cahill, MD  Past Medical History  Diagnosis Date  . COPD (chronic obstructive pulmonary disease)   . High blood pressure   . Anemia   . Vertigo 11/23/2013  . Thyroid disease    Past Surgical History  Procedure Laterality Date  . Cholecystectomy    . Tubal ligation    . Appendectomy     Family History  Problem Relation Age of Onset  . Hypertension Mother   . Varicose Veins Father   . Heart attack Father   . Heart disease Father   . Hypertension Brother   . COPD Brother    History  Substance Use Topics  . Smoking status: Former Smoker    Types: Cigarettes    Quit date: 12/02/1997  . Smokeless tobacco: Never Used  . Alcohol Use: No   OB History   Grav Para Term Preterm Abortions TAB SAB Ect Mult Living   2 2        2      Review of  Systems A complete 10 system review of systems was obtained and all systems are negative except as noted in the HPI and PMH.   Allergies  Augmentin; Ace inhibitors; and Entex la  Home Medications   Current Outpatient Rx  Name  Route  Sig  Dispense  Refill  . albuterol (PROAIR HFA) 108 (90 BASE) MCG/ACT inhaler   Inhalation   Inhale 1-2 puffs into the lungs every 6 (six) hours as needed for wheezing or shortness of breath.         . ALPRAZolam (XANAX) 0.25 MG tablet   Oral   Take 0.25 mg by mouth daily as needed for anxiety.          Marland Kitchen amLODipine (NORVASC) 2.5 MG tablet   Oral   Take 2.5 mg by mouth every morning.          Marland Kitchen aspirin 81 MG tablet   Oral   Take 81 mg by mouth daily.         Marland Kitchen azithromycin (ZITHROMAX) 250 MG tablet   Oral   Take 250-500 mg by mouth See admin instructions. Take two tablets on day 1 then take one tablet on days 2 through 5. 5 day course starting on January 31, 2014         . Cyanocobalamin (B-12 PO)   Oral  Take 1 tablet by mouth daily.         Marland Kitchen dextromethorphan-guaiFENesin (MUCINEX DM) 30-600 MG per 12 hr tablet   Oral   Take 1 tablet by mouth 2 (two) times daily.         Marland Kitchen EVISTA 60 MG tablet   Oral   Take 60 mg by mouth every morning.          . Glucosamine-Chondroitin-Vit D3 1500-1200-800 MG-MG-UNIT PACK   Oral   Take 1 tablet by mouth daily.         . hydrochlorothiazide (HYDRODIURIL) 25 MG tablet   Oral   Take 1 tablet by mouth daily.         Marland Kitchen ibuprofen (ADVIL,MOTRIN) 800 MG tablet   Oral   Take 800 mg by mouth every 4 (four) hours as needed.          Marland Kitchen ipratropium (ATROVENT HFA) 17 MCG/ACT inhaler   Inhalation   Inhale 2 puffs into the lungs once as needed for wheezing.         Marland Kitchen levothyroxine (SYNTHROID, LEVOTHROID) 125 MCG tablet   Oral   Take 62.5 mcg by mouth daily.          Marland Kitchen losartan (COZAAR) 50 MG tablet   Oral   Take 25 mg by mouth daily.         . Multiple Vitamins-Minerals  (CENTRUM SILVER PO)   Oral   Take by mouth.         . phenol-menthol (CEPASTAT) 14.5 MG lozenge   Buccal   Place 1 lozenge inside cheek as needed for sore throat.         Marland Kitchen SPIRIVA HANDIHALER 18 MCG inhalation capsule   Inhalation   Place 18 mcg into inhaler and inhale daily.          . traMADol (ULTRAM) 50 MG tablet   Oral   Take 1 tablet by mouth daily as needed for moderate pain or severe pain.           BP 137/92  Pulse 101  Temp(Src) 98.9 F (37.2 C) (Oral)  Resp 20  Ht 5\' 4"  (1.626 m)  Wt 161 lb (73.029 kg)  BMI 27.62 kg/m2  SpO2 98%  Physical Exam  Nursing note and vitals reviewed. Constitutional: She is oriented to person, place, and time. She appears well-developed and well-nourished. No distress.  HENT:  Head: Normocephalic and atraumatic.  Eyes: EOM are normal. Pupils are equal, round, and reactive to light.  Neck: Normal range of motion. Neck supple.  Cardiovascular: Normal rate, regular rhythm, normal heart sounds and intact distal pulses.   No murmur heard. Pulmonary/Chest: Effort normal and breath sounds normal. No respiratory distress. She has no wheezes. She has no rales.  Abdominal: Bowel sounds are normal. She exhibits no distension. There is no tenderness.  Musculoskeletal: Normal range of motion. She exhibits no edema and no tenderness.  Neurological: She is alert and oriented to person, place, and time. She has normal strength. No cranial nerve deficit or sensory deficit.  Skin: Skin is warm and dry. No rash noted.  Psychiatric: She has a normal mood and affect.   ED Course  Procedures (including critical care time)  DIAGNOSTIC STUDIES: Oxygen Saturation is 98% on room air, normal by my interpretation.    COORDINATION OF CARE: 11:09 PM-Discussed treatment plan with pt at bedside and pt agreed to plan.   Labs Review Labs Reviewed - No data to display Imaging Review  No results found.   EKG Interpretation None     Medications -  No data to display  MDM   Final diagnoses:  Bronchitis    Pt with likely viral URI/Bronchitis. Already taking Z-pak but advised it may not be affective at treating her symptoms. Given additional symptomatic meds for home including hydrocodone cough syrup to help with cough and sore throat and short course of prednisone for bronchitis.   I personally performed the services described in this documentation, which was scribed in my presence. The recorded information has been reviewed and is accurate.      Charles B. Karle Starch, MD 02/01/14 [phone]

## 2014-02-01 NOTE — ED Notes (Signed)
Pt seen by PCP & started on antibiotics, pt presents w/ a non productive cough & feels SOB. Throat is red w/o any white lesions.

## 2014-02-01 NOTE — Discharge Instructions (Signed)

## 2014-02-01 NOTE — ED Notes (Signed)
Pt alert & oriented x4, stable gait. Patient given discharge instructions, paperwork & prescription(s). Patient  instructed to stop at the registration desk to finish any additional paperwork. Patient verbalized understanding. Pt left department w/ no further questions. 

## 2014-02-01 NOTE — ED Notes (Signed)
Onset Sunday morning with sore throat, cough, hoarse.  Seen by Dr Nevada Crane yesterday Started zithromax yesterday.

## 2014-02-09 DIAGNOSIS — D235 Other benign neoplasm of skin of trunk: Secondary | ICD-10-CM | POA: Diagnosis not present

## 2014-02-09 DIAGNOSIS — L821 Other seborrheic keratosis: Secondary | ICD-10-CM | POA: Diagnosis not present

## 2014-02-09 DIAGNOSIS — Z85828 Personal history of other malignant neoplasm of skin: Secondary | ICD-10-CM | POA: Diagnosis not present

## 2014-02-09 DIAGNOSIS — D485 Neoplasm of uncertain behavior of skin: Secondary | ICD-10-CM | POA: Diagnosis not present

## 2014-02-14 ENCOUNTER — Encounter: Payer: Self-pay | Admitting: Vascular Surgery

## 2014-02-15 ENCOUNTER — Encounter: Payer: Self-pay | Admitting: Vascular Surgery

## 2014-02-15 ENCOUNTER — Other Ambulatory Visit: Payer: Self-pay | Admitting: *Deleted

## 2014-02-15 ENCOUNTER — Ambulatory Visit (INDEPENDENT_AMBULATORY_CARE_PROVIDER_SITE_OTHER): Payer: Medicare Other | Admitting: Vascular Surgery

## 2014-02-15 VITALS — BP 125/81 | HR 82 | Ht 64.0 in | Wt 161.9 lb

## 2014-02-15 DIAGNOSIS — I8390 Asymptomatic varicose veins of unspecified lower extremity: Secondary | ICD-10-CM

## 2014-02-15 MED ORDER — RALOXIFENE HCL 60 MG PO TABS: 60.0000 mg | ORAL_TABLET | Freq: Every morning | ORAL | Status: DC

## 2014-02-15 NOTE — Progress Notes (Signed)
Subjective:     Patient ID: [name], female   DOB: Apr 25, 1941, 73 y.o.   MRN: [id number]  HPI is a 73 year old female was evaluated by Dr. 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 Patient has more long-leg elastic compression stockings 20-30 mm gradient and tried elevation and ibuprofen. She continues to have some symptomatology in the right leg particularly distal edema which worsens as the day progresses. She also has some heaviness and tingling in the leg in the bulging varicosities. She has no history of DVT or thrombophlebitis.  Past Medical History  Diagnosis Date  . COPD (chronic obstructive pulmonary disease)   . High blood pressure   . Anemia   . Vertigo 11/23/2013  . Thyroid disease     History  Substance Use Topics  . Smoking status: Former Smoker    Types: Cigarettes    Quit date: 12/02/1997  . Smokeless tobacco: Never Used  . Alcohol Use: No    Family History  Problem Relation Age of Onset  . Hypertension Mother   . Varicose Veins Father   . Heart attack Father   . Heart disease Father   . Hypertension Brother   . COPD Brother     Allergies  Allergen Reactions  . Augmentin [Amoxicillin-Pot Clavulanate]   . Ace Inhibitors   . Entex La     Current outpatient prescriptions:albuterol (PROAIR HFA) 108 (90 BASE) MCG/ACT inhaler, Inhale 1-2 puffs into the lungs every 6 (six) hours as needed for wheezing or shortness of breath., Disp: , Rfl: ;  ALPRAZolam (XANAX) 0.25 MG tablet, Take 0.25 mg by mouth daily as needed for anxiety. , Disp: , Rfl: ;  amLODipine (NORVASC) 2.5 MG tablet, Take 2.5 mg by mouth every morning. , Disp: , Rfl:  aspirin 81 MG tablet, Take 81 mg by mouth daily., Disp: , Rfl: ;  azithromycin (ZITHROMAX) 250 MG tablet, Take 250-500 mg by mouth See admin instructions. Take two tablets on day 1 then take one tablet on days 2 through 5. 5 day course starting on January 31, 2014, Disp: , Rfl: ;  Cyanocobalamin (B-12 PO), Take 1 tablet by mouth daily., Disp: , Rfl:  dextromethorphan-guaiFENesin (MUCINEX DM) 30-600 MG per 12 hr tablet, Take 1 tablet by mouth 2 (two) times daily., Disp: , Rfl: ;  EVISTA 60 MG tablet, Take 60 mg by mouth every morning. , Disp: , Rfl: ;  Glucosamine-Chondroitin-Vit D3 1500-1200-800 MG-MG-UNIT PACK, Take 1 tablet by mouth daily., Disp: , Rfl: ;  hydrochlorothiazide (HYDRODIURIL) 25 MG tablet, Take 1 tablet by mouth daily., Disp: , Rfl:  HYDROcodone-homatropine (HYCODAN) 5-1.5 MG/5ML syrup, Take 5 mLs by mouth every 6 (six) hours as needed for cough., Disp: 120 mL, Rfl: 0;  ibuprofen (ADVIL,MOTRIN) 800 MG tablet, Take 800 mg by mouth every 4 (four) hours as needed. , Disp: , Rfl: ;  ipratropium (ATROVENT HFA) 17 MCG/ACT inhaler, Inhale 2 puffs into the lungs once as needed for wheezing., Disp: , Rfl:  levothyroxine (SYNTHROID, LEVOTHROID) 125 MCG tablet, Take 62.5 mcg by mouth daily. , Disp: , Rfl: ;  losartan (COZAAR) 50 MG tablet, Take 25 mg by mouth daily., Disp: , Rfl: ;  Multiple Vitamins-Minerals (CENTRUM SILVER PO), Take by mouth., Disp: , Rfl: ;  phenol-menthol (CEPASTAT) 14.5 MG lozenge, Place 1 lozenge inside cheek as needed for sore throat., Disp: , Rfl:  predniSONE (DELTASONE) 20 MG tablet, Take 2 tablets (40  mg total) by mouth daily with breakfast., Disp: 8 tablet, Rfl: 0;  SPIRIVA HANDIHALER 18 MCG inhalation capsule, Place 18 mcg into inhaler and inhale daily. , Disp: , Rfl: ;  traMADol (ULTRAM) 50 MG tablet, Take 1 tablet by mouth daily as needed for moderate pain or severe pain. , Disp: , Rfl:   BP 125/81  Pulse 82  Ht 5\' 4"  (1.626 m)  Wt 161 lb 14.4 oz (73.437 kg)  BMI 27.78 kg/m2  SpO2 99%  Body mass index is 27.78 kg/(m^2).          Review of Systems denies chest pain, dyspnea on exertion, PND, orthopnea, wheezing     Objective:   Physical Exam BP 125/81  Pulse 82  Ht 5\' 4"  (1.626 m)  Wt 161 lb 14.4 oz (73.437  kg)  BMI 27.78 kg/m2  SpO2 99%  General well-developed well-nourished female no apparent stress alert and oriented x3 Lungs no rhonchi or wheezing Right leg with bulging varicosities in the medial calf overlying the great saphenous vein with diffuse reticular and spider flames lower third of the leg one plus edema no active ulcer. 3 posterior cells pedis pulse palpable. Left leg with smaller varicosities in less edema in the ankle.  Patient has gross reflux right great saphenous system supplying these varicosities     Assessment:     Gross reflux right great saphenous vein with distal edema and pain not managed completely by conservative measures-affecting the patient's daily living    Plan:     Patient needs laser ablation right great saphenous vein to try to improve her . pain and edema. Will proceed with precertification to perform this and there future this nice lady

## 2014-02-21 DIAGNOSIS — D235 Other benign neoplasm of skin of trunk: Secondary | ICD-10-CM | POA: Diagnosis not present

## 2014-02-21 DIAGNOSIS — D485 Neoplasm of uncertain behavior of skin: Secondary | ICD-10-CM | POA: Diagnosis not present

## 2014-02-28 ENCOUNTER — Other Ambulatory Visit: Payer: Self-pay | Admitting: *Deleted

## 2014-02-28 DIAGNOSIS — I83893 Varicose veins of bilateral lower extremities with other complications: Secondary | ICD-10-CM

## 2014-03-01 ENCOUNTER — Other Ambulatory Visit: Payer: Self-pay | Admitting: *Deleted

## 2014-03-01 DIAGNOSIS — I83893 Varicose veins of bilateral lower extremities with other complications: Secondary | ICD-10-CM

## 2014-03-03 ENCOUNTER — Encounter: Payer: Self-pay | Admitting: Vascular Surgery

## 2014-03-07 ENCOUNTER — Ambulatory Visit (INDEPENDENT_AMBULATORY_CARE_PROVIDER_SITE_OTHER): Payer: Medicare Other | Admitting: Vascular Surgery

## 2014-03-07 ENCOUNTER — Encounter: Payer: Self-pay | Admitting: Vascular Surgery

## 2014-03-07 VITALS — BP 157/78 | HR 98 | Resp 18 | Ht 64.0 in | Wt 159.0 lb

## 2014-03-07 DIAGNOSIS — I8390 Asymptomatic varicose veins of unspecified lower extremity: Secondary | ICD-10-CM

## 2014-03-07 DIAGNOSIS — I83893 Varicose veins of bilateral lower extremities with other complications: Secondary | ICD-10-CM

## 2014-03-07 DIAGNOSIS — I8310 Varicose veins of unspecified lower extremity with inflammation: Secondary | ICD-10-CM

## 2014-03-07 NOTE — Progress Notes (Signed)
Subjective:     Patient ID: [name], female   DOB: Jun 23, 1941, 73 y.o.   MRN: [id number]  HPI this 73 year old female had laser ablation of the right great saphenous vein from the proximal calf to the near the saphenofemoral junction formed under local tumescent anesthesia. A total of 2000 J of energy was utilized. She tolerated the procedure well.  Review of Systems     Objective:   Physical Exam BP 157/78  Pulse 98  Resp 18  Ht 5\' 4"  (1.626 m)  Wt 159 lb (72.122 kg)  BMI 27.28 kg/m2       Assessment:     Well-tolerated laser ablation right great saphenous vein performed under local tumescent anesthesia    Plan:     Return in one week for a venous duplex exam to confirm closure right great saphenous vein

## 2014-03-07 NOTE — Progress Notes (Signed)
   Laser Ablation Procedure      Date: 03/07/2014    [name] DOB:[birth year]  Consent signed: Yes  Surgeon:J.D. Kellie Simmering  Procedure: Laser Ablation: right Greater Saphenous Vein  BP 157/78  Pulse 98  Resp 18  Ht 5\' 4"  (1.626 m)  Wt 159 lb (72.122 kg)  BMI 27.28 kg/m2  Start time: 11:20   End time: 12:05  Tumescent Anesthesia: 400 cc 0.9% NaCl with 50 cc Lidocaine HCL with 1% Epi and 15 cc 8.4% NaHCO3  Local Anesthesia: 6 cc Lidocaine HCL and NaHCO3 (ratio 2:1)  Pulsed mode: 15 watts, 557ms delay, 1.0 duration Total energy: 2035, total pulses: 136, total time: 2:15  After marking the course of the saphenous vein and the secondary varicosities in the standing position, the patient was placed on the operating table in the supine position, and the right leg was prepped and draped in sterile fashion. Local anesthetic was administered, and under ultrasound guidance the saphenous vein was accessed with a micro needle and guide wire; then the micro puncture sheath was placed. A guide wire was inserted to the saphenofemoral junction, followed by a 5 french sheath. The position of the sheath and then the laser fiber below the junction was confirmed using the ultrasound and visualization of the aiming beam. Tumescent anesthesia was administered along the course of the saphenous vein using ultrasound guidance. Protective laser glasses were placed on the patient, and the laser was fired at 15 watt pulsed mode advancing 1-2 mm per sec. For a total of 2035 joules. A steri strip was applied to the puncture site.      Patient tolerated procedure well: Yes  Notes:   Description of Procedure:  .    ABD pads and thigh high compression stockings were applied.  Ace wrap bandages were applied to the top of the saphenofemoral junction.  Blood loss was less than 15 cc.  The patient ambulated out of the operating room having tolerated the procedure well.

## 2014-03-11 ENCOUNTER — Encounter: Payer: Self-pay | Admitting: Vascular Surgery

## 2014-03-14 ENCOUNTER — Ambulatory Visit (HOSPITAL_COMMUNITY)
Admission: RE | Admit: 2014-03-14 | Discharge: 2014-03-14 | Disposition: A | Discharge status: Home / Self Care | Payer: Medicare Other | Source: Ambulatory Visit | Attending: Vascular Surgery | Admitting: Vascular Surgery

## 2014-03-14 ENCOUNTER — Encounter: Payer: Self-pay | Admitting: Vascular Surgery

## 2014-03-14 ENCOUNTER — Ambulatory Visit (INDEPENDENT_AMBULATORY_CARE_PROVIDER_SITE_OTHER): Payer: Medicare Other | Admitting: Vascular Surgery

## 2014-03-14 VITALS — BP 147/87 | HR 57 | Resp 16 | Ht 64.0 in | Wt 158.5 lb

## 2014-03-14 DIAGNOSIS — I8390 Asymptomatic varicose veins of unspecified lower extremity: Secondary | ICD-10-CM | POA: Diagnosis not present

## 2014-03-14 DIAGNOSIS — I83893 Varicose veins of bilateral lower extremities with other complications: Secondary | ICD-10-CM

## 2014-03-14 NOTE — Progress Notes (Signed)
Subjective:     Patient ID: [name], female   DOB: [birth year], 73 y.o.   MRN: [id number]  HPI 73 year old female returns 1 week post laser ablation right great saphenous vein for venous hypertension with pain and edema. She states that her leg feels 100% better. She has some mild discomfort in the thigh where the ablation was performed. He has been wearing elastic compression stockings and taking ibuprofen as instructed.  Past Medical History  Diagnosis Date  . COPD (chronic obstructive pulmonary disease)   . High blood pressure   . Anemia   . Vertigo 11/23/2013  . Thyroid disease   . Varicose veins     History  Substance Use Topics  . Smoking status: Former Smoker    Types: Cigarettes    Quit date: 12/02/1997  . Smokeless tobacco: Never Used  . Alcohol Use: No    Family History  Problem Relation Age of Onset  . Hypertension Mother   . Varicose Veins Father   . Heart attack Father   . Heart disease Father   . Hypertension Brother   . COPD Brother     Allergies  Allergen Reactions  . Augmentin [Amoxicillin-Pot Clavulanate]   . Ace Inhibitors   . Entex La     Current outpatient prescriptions:albuterol (PROAIR HFA) 108 (90 BASE) MCG/ACT inhaler, Inhale 1-2 puffs into the lungs every 6 (six) hours as needed for wheezing or shortness of breath., Disp: , Rfl: ;  ALPRAZolam (XANAX) 0.25 MG tablet, Take 0.25 mg by mouth daily as needed for anxiety. , Disp: , Rfl: ;  amLODipine (NORVASC) 2.5 MG tablet, Take 2.5 mg by mouth every morning. , Disp: , Rfl:  aspirin 81 MG tablet, Take 81 mg by mouth daily., Disp: , Rfl: ;  azithromycin (ZITHROMAX) 250 MG tablet, Take 250-500 mg by mouth See admin instructions. Take two tablets on day 1 then take one tablet on days 2 through 5. 5 day course starting on January 31, 2014, Disp: , Rfl: ;  Cyanocobalamin (B-12 PO), Take 1 tablet by mouth daily., Disp: , Rfl:  dextromethorphan-guaiFENesin (MUCINEX DM) 30-600 MG per 12 hr tablet, Take 1  tablet by mouth 2 (two) times daily., Disp: , Rfl: ;  hydrochlorothiazide (HYDRODIURIL) 25 MG tablet, Take 1 tablet by mouth daily., Disp: , Rfl: ;  HYDROcodone-homatropine (HYCODAN) 5-1.5 MG/5ML syrup, Take 5 mLs by mouth every 6 (six) hours as needed for cough., Disp: 120 mL, Rfl: 0 ibuprofen (ADVIL,MOTRIN) 800 MG tablet, Take 800 mg by mouth every 4 (four) hours as needed. , Disp: , Rfl: ;  ipratropium (ATROVENT HFA) 17 MCG/ACT inhaler, Inhale 2 puffs into the lungs once as needed for wheezing., Disp: , Rfl: ;  levothyroxine (SYNTHROID, LEVOTHROID) 125 MCG tablet, Take 62.5 mcg by mouth daily. , Disp: , Rfl: ;  losartan (COZAAR) 50 MG tablet, Take 25 mg by mouth daily., Disp: , Rfl:  Multiple Vitamins-Minerals (CENTRUM SILVER PO), Take by mouth., Disp: , Rfl: ;  phenol-menthol (CEPASTAT) 14.5 MG lozenge, Place 1 lozenge inside cheek as needed for sore throat., Disp: , Rfl: ;  predniSONE (DELTASONE) 20 MG tablet, Take 2 tablets (40 mg total) by mouth daily with breakfast., Disp: 8 tablet, Rfl: 0;  raloxifene (EVISTA) 60 MG tablet, Take 1 tablet (60 mg total) by mouth every morning., Disp: 90 tablet, Rfl: 3 SPIRIVA HANDIHALER 18 MCG inhalation capsule, Place 18 mcg into inhaler and inhale daily. , Disp: , Rfl: ;  traMADol (ULTRAM) 50 MG tablet,  Take 1 tablet by mouth daily as needed for moderate pain or severe pain. , Disp: , Rfl: ;  Glucosamine-Chondroitin-Vit D3 1500-1200-800 MG-MG-UNIT PACK, Take 1 tablet by mouth daily., Disp: , Rfl:   BP 147/87  Pulse 57  Resp 16  Ht 5\' 4"  (1.626 m)  Wt 158 lb 8 oz (71.895 kg)  BMI 27.19 kg/m2  Body mass index is 27.19 kg/(m^2).          Review of Systems denies chest pain, dyspnea on exertion, PND, orthopnea, hemoptysis.    Objective:   Physical Exam BP 147/87  Pulse 57  Resp 16  Ht 5\' 4"  (1.626 m)  Wt 158 lb 8 oz (71.895 kg)  BMI 27.19 kg/m2  General well-developed well-nourished female no apparent stress alert and oriented x3 Right leg with  mild discomfort along the course of the great saphenous vein in the mid proximal thigh. No distal edema noted. A few varicosities below the knee which are not tense. 2+ dorsalis pedis pulse palpable.  Today I ordered a venous duplex exam of the right leg which I reviewed and interpreted. There is no DVT. Dry great saphenous vein was totally closed to a point near the saphenofemoral junction.      Assessment:     Successful well-tolerated laser ablation right great saphenous vein for venous hypertension with pain and edema    Plan:     Return to see me on a when necessary basis

## 2014-05-02 DIAGNOSIS — I10 Essential (primary) hypertension: Secondary | ICD-10-CM | POA: Diagnosis not present

## 2014-05-02 DIAGNOSIS — E039 Hypothyroidism, unspecified: Secondary | ICD-10-CM | POA: Diagnosis not present

## 2014-05-02 DIAGNOSIS — I872 Venous insufficiency (chronic) (peripheral): Secondary | ICD-10-CM | POA: Diagnosis not present

## 2014-05-02 DIAGNOSIS — J449 Chronic obstructive pulmonary disease, unspecified: Secondary | ICD-10-CM | POA: Diagnosis not present

## 2014-05-02 DIAGNOSIS — G589 Mononeuropathy, unspecified: Secondary | ICD-10-CM | POA: Diagnosis not present

## 2014-06-13 DIAGNOSIS — R7301 Impaired fasting glucose: Secondary | ICD-10-CM | POA: Diagnosis not present

## 2014-06-13 DIAGNOSIS — E039 Hypothyroidism, unspecified: Secondary | ICD-10-CM | POA: Diagnosis not present

## 2014-06-13 DIAGNOSIS — I10 Essential (primary) hypertension: Secondary | ICD-10-CM | POA: Diagnosis not present

## 2014-06-15 DIAGNOSIS — J449 Chronic obstructive pulmonary disease, unspecified: Secondary | ICD-10-CM | POA: Diagnosis not present

## 2014-06-15 DIAGNOSIS — R7309 Other abnormal glucose: Secondary | ICD-10-CM | POA: Diagnosis not present

## 2014-06-15 DIAGNOSIS — I10 Essential (primary) hypertension: Secondary | ICD-10-CM | POA: Diagnosis not present

## 2014-06-15 DIAGNOSIS — E039 Hypothyroidism, unspecified: Secondary | ICD-10-CM | POA: Diagnosis not present

## 2014-06-21 DIAGNOSIS — D235 Other benign neoplasm of skin of trunk: Secondary | ICD-10-CM | POA: Diagnosis not present

## 2014-06-21 DIAGNOSIS — D045 Carcinoma in situ of skin of trunk: Secondary | ICD-10-CM | POA: Diagnosis not present

## 2014-06-21 DIAGNOSIS — D485 Neoplasm of uncertain behavior of skin: Secondary | ICD-10-CM | POA: Diagnosis not present

## 2014-06-22 ENCOUNTER — Other Ambulatory Visit: Payer: Self-pay | Admitting: Adult Health

## 2014-06-22 DIAGNOSIS — Z1231 Encounter for screening mammogram for malignant neoplasm of breast: Secondary | ICD-10-CM

## 2014-07-26 DIAGNOSIS — G589 Mononeuropathy, unspecified: Secondary | ICD-10-CM | POA: Diagnosis not present

## 2014-07-26 DIAGNOSIS — IMO0001 Reserved for inherently not codable concepts without codable children: Secondary | ICD-10-CM | POA: Diagnosis not present

## 2014-07-27 ENCOUNTER — Ambulatory Visit (HOSPITAL_COMMUNITY)
Admission: RE | Admit: 2014-07-27 | Discharge: 2014-07-27 | Disposition: A | Discharge status: Home / Self Care | Payer: Medicare Other | Source: Ambulatory Visit | Attending: Adult Health | Admitting: Adult Health

## 2014-07-27 DIAGNOSIS — Z1231 Encounter for screening mammogram for malignant neoplasm of breast: Secondary | ICD-10-CM | POA: Diagnosis not present

## 2014-08-19 DIAGNOSIS — H2510 Age-related nuclear cataract, unspecified eye: Secondary | ICD-10-CM | POA: Diagnosis not present

## 2014-08-19 DIAGNOSIS — H5210 Myopia, unspecified eye: Secondary | ICD-10-CM | POA: Diagnosis not present

## 2014-08-23 DIAGNOSIS — Z23 Encounter for immunization: Secondary | ICD-10-CM | POA: Diagnosis not present

## 2014-08-24 DIAGNOSIS — IMO0001 Reserved for inherently not codable concepts without codable children: Secondary | ICD-10-CM | POA: Diagnosis not present

## 2014-08-29 DIAGNOSIS — H16109 Unspecified superficial keratitis, unspecified eye: Secondary | ICD-10-CM | POA: Diagnosis not present

## 2014-08-30 DIAGNOSIS — L905 Scar conditions and fibrosis of skin: Secondary | ICD-10-CM | POA: Diagnosis not present

## 2014-08-30 DIAGNOSIS — M720 Palmar fascial fibromatosis [Dupuytren]: Secondary | ICD-10-CM | POA: Diagnosis not present

## 2014-08-30 DIAGNOSIS — Z85828 Personal history of other malignant neoplasm of skin: Secondary | ICD-10-CM | POA: Diagnosis not present

## 2014-09-19 DIAGNOSIS — J0100 Acute maxillary sinusitis, unspecified: Secondary | ICD-10-CM | POA: Diagnosis not present

## 2014-10-03 ENCOUNTER — Encounter: Payer: Self-pay | Admitting: Vascular Surgery

## 2014-10-03 DIAGNOSIS — J441 Chronic obstructive pulmonary disease with (acute) exacerbation: Secondary | ICD-10-CM | POA: Diagnosis not present

## 2014-10-10 DIAGNOSIS — M20011 Mallet finger of right finger(s): Secondary | ICD-10-CM | POA: Diagnosis not present

## 2014-10-12 DIAGNOSIS — M20011 Mallet finger of right finger(s): Secondary | ICD-10-CM | POA: Diagnosis not present

## 2014-10-12 DIAGNOSIS — M79641 Pain in right hand: Secondary | ICD-10-CM | POA: Diagnosis not present

## 2014-10-12 DIAGNOSIS — M62542 Muscle wasting and atrophy, not elsewhere classified, left hand: Secondary | ICD-10-CM | POA: Diagnosis not present

## 2014-10-12 DIAGNOSIS — M25641 Stiffness of right hand, not elsewhere classified: Secondary | ICD-10-CM | POA: Diagnosis not present

## 2014-10-19 DIAGNOSIS — M79641 Pain in right hand: Secondary | ICD-10-CM | POA: Diagnosis not present

## 2014-10-19 DIAGNOSIS — M20011 Mallet finger of right finger(s): Secondary | ICD-10-CM | POA: Diagnosis not present

## 2014-10-19 DIAGNOSIS — M62542 Muscle wasting and atrophy, not elsewhere classified, left hand: Secondary | ICD-10-CM | POA: Diagnosis not present

## 2014-10-19 DIAGNOSIS — M25641 Stiffness of right hand, not elsewhere classified: Secondary | ICD-10-CM | POA: Diagnosis not present

## 2014-11-02 DIAGNOSIS — M62542 Muscle wasting and atrophy, not elsewhere classified, left hand: Secondary | ICD-10-CM | POA: Diagnosis not present

## 2014-11-02 DIAGNOSIS — M79641 Pain in right hand: Secondary | ICD-10-CM | POA: Diagnosis not present

## 2014-11-02 DIAGNOSIS — M25641 Stiffness of right hand, not elsewhere classified: Secondary | ICD-10-CM | POA: Diagnosis not present

## 2014-11-02 DIAGNOSIS — M20011 Mallet finger of right finger(s): Secondary | ICD-10-CM | POA: Diagnosis not present

## 2014-11-07 DIAGNOSIS — M20011 Mallet finger of right finger(s): Secondary | ICD-10-CM | POA: Diagnosis not present

## 2014-11-17 DIAGNOSIS — M79641 Pain in right hand: Secondary | ICD-10-CM | POA: Diagnosis not present

## 2014-11-17 DIAGNOSIS — M62542 Muscle wasting and atrophy, not elsewhere classified, left hand: Secondary | ICD-10-CM | POA: Diagnosis not present

## 2014-11-17 DIAGNOSIS — M20011 Mallet finger of right finger(s): Secondary | ICD-10-CM | POA: Diagnosis not present

## 2014-11-17 DIAGNOSIS — M25641 Stiffness of right hand, not elsewhere classified: Secondary | ICD-10-CM | POA: Diagnosis not present

## 2014-12-06 DIAGNOSIS — M79641 Pain in right hand: Secondary | ICD-10-CM | POA: Diagnosis not present

## 2014-12-06 DIAGNOSIS — M20011 Mallet finger of right finger(s): Secondary | ICD-10-CM | POA: Diagnosis not present

## 2014-12-06 DIAGNOSIS — M25641 Stiffness of right hand, not elsewhere classified: Secondary | ICD-10-CM | POA: Diagnosis not present

## 2014-12-06 DIAGNOSIS — M62542 Muscle wasting and atrophy, not elsewhere classified, left hand: Secondary | ICD-10-CM | POA: Diagnosis not present

## 2014-12-13 DIAGNOSIS — E039 Hypothyroidism, unspecified: Secondary | ICD-10-CM | POA: Diagnosis not present

## 2014-12-13 DIAGNOSIS — R7301 Impaired fasting glucose: Secondary | ICD-10-CM | POA: Diagnosis not present

## 2014-12-13 DIAGNOSIS — I10 Essential (primary) hypertension: Secondary | ICD-10-CM | POA: Diagnosis not present

## 2014-12-15 DIAGNOSIS — J441 Chronic obstructive pulmonary disease with (acute) exacerbation: Secondary | ICD-10-CM | POA: Diagnosis not present

## 2014-12-15 DIAGNOSIS — E039 Hypothyroidism, unspecified: Secondary | ICD-10-CM | POA: Diagnosis not present

## 2014-12-15 DIAGNOSIS — I10 Essential (primary) hypertension: Secondary | ICD-10-CM | POA: Diagnosis not present

## 2014-12-15 DIAGNOSIS — G629 Polyneuropathy, unspecified: Secondary | ICD-10-CM | POA: Diagnosis not present

## 2014-12-20 DIAGNOSIS — M62542 Muscle wasting and atrophy, not elsewhere classified, left hand: Secondary | ICD-10-CM | POA: Diagnosis not present

## 2014-12-20 DIAGNOSIS — M20011 Mallet finger of right finger(s): Secondary | ICD-10-CM | POA: Diagnosis not present

## 2014-12-20 DIAGNOSIS — M79641 Pain in right hand: Secondary | ICD-10-CM | POA: Diagnosis not present

## 2014-12-20 DIAGNOSIS — M25641 Stiffness of right hand, not elsewhere classified: Secondary | ICD-10-CM | POA: Diagnosis not present

## 2014-12-21 DIAGNOSIS — M20011 Mallet finger of right finger(s): Secondary | ICD-10-CM | POA: Diagnosis not present

## 2014-12-22 ENCOUNTER — Ambulatory Visit (INDEPENDENT_AMBULATORY_CARE_PROVIDER_SITE_OTHER): Payer: Medicare Other | Admitting: Orthopedic Surgery

## 2014-12-22 ENCOUNTER — Encounter: Payer: Self-pay | Admitting: Orthopedic Surgery

## 2014-12-22 VITALS — BP 143/81 | Ht 64.0 in | Wt 160.0 lb

## 2014-12-22 DIAGNOSIS — M5432 Sciatica, left side: Secondary | ICD-10-CM | POA: Diagnosis not present

## 2014-12-22 DIAGNOSIS — M7072 Other bursitis of hip, left hip: Secondary | ICD-10-CM | POA: Diagnosis not present

## 2014-12-22 MED ORDER — GABAPENTIN 100 MG PO CAPS: 100.0000 mg | ORAL_CAPSULE | Freq: Three times a day (TID) | ORAL | Status: DC | PRN

## 2014-12-22 NOTE — Progress Notes (Signed)
Patient ID: [name], female   DOB: 04-29-1941, 74 y.o.   MRN: [id number] 74 year old female previously treated for hip bursitis complains of pain over the left hip and pain when she is riding in the car. When she is riding in the car she can place a pillow under her hip and leave her symptoms which are included with radiating pain towards her knee. No history of trauma. Symptoms are intermittent but when present severe  Review of systems bowel and bladder function are normal. Past Medical History  Diagnosis Date  . COPD (chronic obstructive pulmonary disease)   . High blood pressure   . Anemia   . Vertigo 11/23/2013  . Thyroid disease   . Varicose veins    BP 143/81 mmHg  Ht 5\' 4"  (1.626 m)  Wt 160 lb (72.576 kg)  BMI 27.45 kg/m2 Stable vitals oriented 3 mood and affect normal ambulates without assistive device general appearance well-groomed tenderness is noted directly over the greater trochanter of the left hip back is nontender hip range of motion is normal hip stability is normal thigh strength is grade 5 skin is normal pulses are good lymph nodes are negative in the groin sensation is normal  Bursitis with sciatica  Recommend injection  Add gabapentin on a when necessary basis depending on symptoms  Procedure note injection for   Left  hip bursitis  Verbal consent was obtained for injection of the  Left  hip  Timeout was completed to confirm the injection site  The medications used were 40 mg of Depo-Medrol and 1% lidocaine 3 cc  Anesthesia was provided by ethyl chloride and the skin was prepped with alcohol.  After cleaning the skin with alcohol a 25-gauge needle was used to inject the  Left  bursa of the hip

## 2014-12-26 DIAGNOSIS — Z08 Encounter for follow-up examination after completed treatment for malignant neoplasm: Secondary | ICD-10-CM | POA: Diagnosis not present

## 2014-12-26 DIAGNOSIS — D485 Neoplasm of uncertain behavior of skin: Secondary | ICD-10-CM | POA: Diagnosis not present

## 2014-12-26 DIAGNOSIS — J00 Acute nasopharyngitis [common cold]: Secondary | ICD-10-CM | POA: Diagnosis not present

## 2014-12-26 DIAGNOSIS — Z85828 Personal history of other malignant neoplasm of skin: Secondary | ICD-10-CM | POA: Diagnosis not present

## 2014-12-26 DIAGNOSIS — D225 Melanocytic nevi of trunk: Secondary | ICD-10-CM | POA: Diagnosis not present

## 2015-01-03 DIAGNOSIS — M79641 Pain in right hand: Secondary | ICD-10-CM | POA: Diagnosis not present

## 2015-01-03 DIAGNOSIS — M62542 Muscle wasting and atrophy, not elsewhere classified, left hand: Secondary | ICD-10-CM | POA: Diagnosis not present

## 2015-01-03 DIAGNOSIS — M20011 Mallet finger of right finger(s): Secondary | ICD-10-CM | POA: Diagnosis not present

## 2015-01-03 DIAGNOSIS — M25641 Stiffness of right hand, not elsewhere classified: Secondary | ICD-10-CM | POA: Diagnosis not present

## 2015-01-17 DIAGNOSIS — M20011 Mallet finger of right finger(s): Secondary | ICD-10-CM | POA: Diagnosis not present

## 2015-01-17 DIAGNOSIS — M62542 Muscle wasting and atrophy, not elsewhere classified, left hand: Secondary | ICD-10-CM | POA: Diagnosis not present

## 2015-01-17 DIAGNOSIS — M79641 Pain in right hand: Secondary | ICD-10-CM | POA: Diagnosis not present

## 2015-01-17 DIAGNOSIS — M25641 Stiffness of right hand, not elsewhere classified: Secondary | ICD-10-CM | POA: Diagnosis not present

## 2015-01-21 DIAGNOSIS — J441 Chronic obstructive pulmonary disease with (acute) exacerbation: Secondary | ICD-10-CM | POA: Diagnosis not present

## 2015-01-21 DIAGNOSIS — G629 Polyneuropathy, unspecified: Secondary | ICD-10-CM | POA: Diagnosis not present

## 2015-01-21 DIAGNOSIS — I10 Essential (primary) hypertension: Secondary | ICD-10-CM | POA: Diagnosis not present

## 2015-01-21 DIAGNOSIS — E039 Hypothyroidism, unspecified: Secondary | ICD-10-CM | POA: Diagnosis not present

## 2015-02-01 DIAGNOSIS — M62542 Muscle wasting and atrophy, not elsewhere classified, left hand: Secondary | ICD-10-CM | POA: Diagnosis not present

## 2015-02-01 DIAGNOSIS — M25641 Stiffness of right hand, not elsewhere classified: Secondary | ICD-10-CM | POA: Diagnosis not present

## 2015-02-01 DIAGNOSIS — M79641 Pain in right hand: Secondary | ICD-10-CM | POA: Diagnosis not present

## 2015-02-01 DIAGNOSIS — M20011 Mallet finger of right finger(s): Secondary | ICD-10-CM | POA: Diagnosis not present

## 2015-02-14 DIAGNOSIS — M20011 Mallet finger of right finger(s): Secondary | ICD-10-CM | POA: Diagnosis not present

## 2015-02-14 DIAGNOSIS — M62542 Muscle wasting and atrophy, not elsewhere classified, left hand: Secondary | ICD-10-CM | POA: Diagnosis not present

## 2015-02-14 DIAGNOSIS — M79641 Pain in right hand: Secondary | ICD-10-CM | POA: Diagnosis not present

## 2015-02-14 DIAGNOSIS — M25641 Stiffness of right hand, not elsewhere classified: Secondary | ICD-10-CM | POA: Diagnosis not present

## 2015-03-28 ENCOUNTER — Other Ambulatory Visit: Payer: Self-pay | Admitting: Adult Health

## 2015-04-01 DIAGNOSIS — R2242 Localized swelling, mass and lump, left lower limb: Secondary | ICD-10-CM | POA: Diagnosis not present

## 2015-04-01 DIAGNOSIS — R030 Elevated blood-pressure reading, without diagnosis of hypertension: Secondary | ICD-10-CM | POA: Diagnosis not present

## 2015-05-16 DIAGNOSIS — D225 Melanocytic nevi of trunk: Secondary | ICD-10-CM | POA: Diagnosis not present

## 2015-05-16 DIAGNOSIS — X32XXXA Exposure to sunlight, initial encounter: Secondary | ICD-10-CM | POA: Diagnosis not present

## 2015-05-16 DIAGNOSIS — Z1283 Encounter for screening for malignant neoplasm of skin: Secondary | ICD-10-CM | POA: Diagnosis not present

## 2015-05-16 DIAGNOSIS — L570 Actinic keratosis: Secondary | ICD-10-CM | POA: Diagnosis not present

## 2015-05-29 ENCOUNTER — Other Ambulatory Visit: Payer: Self-pay

## 2015-06-12 DIAGNOSIS — I10 Essential (primary) hypertension: Secondary | ICD-10-CM | POA: Diagnosis not present

## 2015-06-12 DIAGNOSIS — E039 Hypothyroidism, unspecified: Secondary | ICD-10-CM | POA: Diagnosis not present

## 2015-06-12 DIAGNOSIS — R7301 Impaired fasting glucose: Secondary | ICD-10-CM | POA: Diagnosis not present

## 2015-06-15 DIAGNOSIS — J449 Chronic obstructive pulmonary disease, unspecified: Secondary | ICD-10-CM | POA: Diagnosis not present

## 2015-06-15 DIAGNOSIS — I10 Essential (primary) hypertension: Secondary | ICD-10-CM | POA: Diagnosis not present

## 2015-06-15 DIAGNOSIS — M542 Cervicalgia: Secondary | ICD-10-CM | POA: Diagnosis not present

## 2015-06-21 ENCOUNTER — Other Ambulatory Visit (HOSPITAL_COMMUNITY): Payer: Self-pay | Admitting: Internal Medicine

## 2015-06-21 ENCOUNTER — Ambulatory Visit (HOSPITAL_COMMUNITY)
Admission: RE | Admit: 2015-06-21 | Discharge: 2015-06-21 | Disposition: A | Discharge status: Home / Self Care | Payer: Medicare Other | Source: Ambulatory Visit | Attending: Internal Medicine | Admitting: Internal Medicine

## 2015-06-21 DIAGNOSIS — M542 Cervicalgia: Secondary | ICD-10-CM | POA: Insufficient documentation

## 2015-06-21 DIAGNOSIS — M47812 Spondylosis without myelopathy or radiculopathy, cervical region: Secondary | ICD-10-CM | POA: Diagnosis not present

## 2015-06-21 DIAGNOSIS — M9971 Connective tissue and disc stenosis of intervertebral foramina of cervical region: Secondary | ICD-10-CM | POA: Diagnosis not present

## 2015-07-04 ENCOUNTER — Other Ambulatory Visit: Payer: Self-pay | Admitting: Internal Medicine

## 2015-07-04 DIAGNOSIS — M542 Cervicalgia: Secondary | ICD-10-CM

## 2015-07-09 ENCOUNTER — Ambulatory Visit
Admission: RE | Admit: 2015-07-09 | Discharge: 2015-07-09 | Disposition: A | Discharge status: Home / Self Care | Payer: Medicare Other | Source: Ambulatory Visit | Attending: Internal Medicine | Admitting: Internal Medicine

## 2015-07-09 DIAGNOSIS — M47812 Spondylosis without myelopathy or radiculopathy, cervical region: Secondary | ICD-10-CM | POA: Diagnosis not present

## 2015-07-09 DIAGNOSIS — M5023 Other cervical disc displacement, cervicothoracic region: Secondary | ICD-10-CM | POA: Diagnosis not present

## 2015-07-09 DIAGNOSIS — M5021 Other cervical disc displacement,  high cervical region: Secondary | ICD-10-CM | POA: Diagnosis not present

## 2015-07-09 DIAGNOSIS — M5022 Other cervical disc displacement, mid-cervical region: Secondary | ICD-10-CM | POA: Diagnosis not present

## 2015-07-09 DIAGNOSIS — M542 Cervicalgia: Secondary | ICD-10-CM

## 2015-08-02 DIAGNOSIS — Z803 Family history of malignant neoplasm of breast: Secondary | ICD-10-CM | POA: Diagnosis not present

## 2015-08-02 DIAGNOSIS — Z1231 Encounter for screening mammogram for malignant neoplasm of breast: Secondary | ICD-10-CM | POA: Diagnosis not present

## 2015-08-15 DIAGNOSIS — M859 Disorder of bone density and structure, unspecified: Secondary | ICD-10-CM | POA: Diagnosis not present

## 2015-08-15 DIAGNOSIS — R531 Weakness: Secondary | ICD-10-CM | POA: Diagnosis not present

## 2015-08-15 DIAGNOSIS — R5383 Other fatigue: Secondary | ICD-10-CM | POA: Diagnosis not present

## 2015-08-16 ENCOUNTER — Encounter: Payer: Self-pay | Admitting: Adult Health

## 2015-08-16 ENCOUNTER — Other Ambulatory Visit (HOSPITAL_COMMUNITY): Payer: Self-pay | Admitting: Physical Medicine and Rehabilitation

## 2015-08-16 ENCOUNTER — Ambulatory Visit (HOSPITAL_COMMUNITY)
Admission: RE | Admit: 2015-08-16 | Discharge: 2015-08-16 | Disposition: A | Discharge status: Home / Self Care | Payer: Medicare Other | Source: Ambulatory Visit | Attending: Physical Medicine and Rehabilitation | Admitting: Physical Medicine and Rehabilitation

## 2015-08-16 DIAGNOSIS — M545 Low back pain: Secondary | ICD-10-CM | POA: Diagnosis not present

## 2015-08-16 DIAGNOSIS — M5136 Other intervertebral disc degeneration, lumbar region: Secondary | ICD-10-CM | POA: Diagnosis not present

## 2015-08-16 DIAGNOSIS — L708 Other acne: Secondary | ICD-10-CM | POA: Diagnosis not present

## 2015-08-16 DIAGNOSIS — M549 Dorsalgia, unspecified: Secondary | ICD-10-CM

## 2015-08-17 DIAGNOSIS — M47817 Spondylosis without myelopathy or radiculopathy, lumbosacral region: Secondary | ICD-10-CM | POA: Diagnosis not present

## 2015-08-17 DIAGNOSIS — M545 Low back pain: Secondary | ICD-10-CM | POA: Diagnosis not present

## 2015-08-17 DIAGNOSIS — G894 Chronic pain syndrome: Secondary | ICD-10-CM | POA: Diagnosis not present

## 2015-08-29 DIAGNOSIS — Z23 Encounter for immunization: Secondary | ICD-10-CM | POA: Diagnosis not present

## 2015-08-31 DIAGNOSIS — M47817 Spondylosis without myelopathy or radiculopathy, lumbosacral region: Secondary | ICD-10-CM | POA: Diagnosis not present

## 2015-08-31 DIAGNOSIS — G894 Chronic pain syndrome: Secondary | ICD-10-CM | POA: Diagnosis not present

## 2015-08-31 DIAGNOSIS — M545 Low back pain: Secondary | ICD-10-CM | POA: Diagnosis not present

## 2015-10-03 DIAGNOSIS — M47817 Spondylosis without myelopathy or radiculopathy, lumbosacral region: Secondary | ICD-10-CM | POA: Diagnosis not present

## 2015-10-03 DIAGNOSIS — M47814 Spondylosis without myelopathy or radiculopathy, thoracic region: Secondary | ICD-10-CM | POA: Diagnosis not present

## 2015-10-03 DIAGNOSIS — M546 Pain in thoracic spine: Secondary | ICD-10-CM | POA: Diagnosis not present

## 2015-10-03 DIAGNOSIS — G894 Chronic pain syndrome: Secondary | ICD-10-CM | POA: Diagnosis not present

## 2015-10-04 DIAGNOSIS — H524 Presbyopia: Secondary | ICD-10-CM | POA: Diagnosis not present

## 2015-10-04 DIAGNOSIS — H2510 Age-related nuclear cataract, unspecified eye: Secondary | ICD-10-CM | POA: Diagnosis not present

## 2015-10-04 DIAGNOSIS — H52223 Regular astigmatism, bilateral: Secondary | ICD-10-CM | POA: Diagnosis not present

## 2015-10-04 DIAGNOSIS — H43819 Vitreous degeneration, unspecified eye: Secondary | ICD-10-CM | POA: Diagnosis not present

## 2015-10-21 DIAGNOSIS — Z79899 Other long term (current) drug therapy: Secondary | ICD-10-CM | POA: Diagnosis not present

## 2015-11-15 DIAGNOSIS — Z1283 Encounter for screening for malignant neoplasm of skin: Secondary | ICD-10-CM | POA: Diagnosis not present

## 2015-11-15 DIAGNOSIS — Z08 Encounter for follow-up examination after completed treatment for malignant neoplasm: Secondary | ICD-10-CM | POA: Diagnosis not present

## 2015-11-15 DIAGNOSIS — Z85828 Personal history of other malignant neoplasm of skin: Secondary | ICD-10-CM | POA: Diagnosis not present

## 2015-11-20 DIAGNOSIS — M47814 Spondylosis without myelopathy or radiculopathy, thoracic region: Secondary | ICD-10-CM | POA: Diagnosis not present

## 2015-11-20 DIAGNOSIS — M47817 Spondylosis without myelopathy or radiculopathy, lumbosacral region: Secondary | ICD-10-CM | POA: Diagnosis not present

## 2015-11-20 DIAGNOSIS — M545 Low back pain: Secondary | ICD-10-CM | POA: Diagnosis not present

## 2015-11-20 DIAGNOSIS — G894 Chronic pain syndrome: Secondary | ICD-10-CM | POA: Diagnosis not present

## 2015-11-20 DIAGNOSIS — M546 Pain in thoracic spine: Secondary | ICD-10-CM | POA: Diagnosis not present

## 2015-12-04 DIAGNOSIS — K120 Recurrent oral aphthae: Secondary | ICD-10-CM | POA: Diagnosis not present

## 2015-12-05 ENCOUNTER — Other Ambulatory Visit: Payer: Self-pay | Admitting: *Deleted

## 2015-12-05 MED ORDER — GABAPENTIN 100 MG PO CAPS: 100.0000 mg | ORAL_CAPSULE | Freq: Three times a day (TID) | ORAL | Status: DC | PRN

## 2015-12-06 ENCOUNTER — Encounter: Payer: Self-pay | Admitting: Adult Health

## 2015-12-06 ENCOUNTER — Ambulatory Visit (INDEPENDENT_AMBULATORY_CARE_PROVIDER_SITE_OTHER): Payer: Medicare Other | Admitting: Adult Health

## 2015-12-06 VITALS — BP 140/90 | HR 72 | Ht 62.5 in | Wt 157.0 lb

## 2015-12-06 DIAGNOSIS — Z01419 Encounter for gynecological examination (general) (routine) without abnormal findings: Secondary | ICD-10-CM | POA: Diagnosis not present

## 2015-12-06 DIAGNOSIS — Z1212 Encounter for screening for malignant neoplasm of rectum: Secondary | ICD-10-CM

## 2015-12-06 DIAGNOSIS — K5900 Constipation, unspecified: Secondary | ICD-10-CM

## 2015-12-06 LAB — HEMOCCULT GUIAC POC 1CARD (OFFICE): Fecal Occult Blood, POC: NEGATIVE

## 2015-12-06 NOTE — Progress Notes (Signed)
Patient ID: [name], female   DOB: 1941-05-02, 75 y.o.   MRN: [id number] History of Present Illness:  [name] is a 75 year old white female,married, in for a well woman gyn exam,she had a normal pap with negative HPV 11/23/13.She complains of constipation.She was seen recently at Dr hall's office for ulcers in her mouth and was prescribed [facility]'s mixture and is much better.Her Husband has some dementia and she worries about him.She is active, walks and plays cards weekly.She has bilateral hearing aides.She got flu shot at [place] in the fall.She has chronic back problems and sees Dr Ace Gins and gets injections, had 12 last year.She kept great grand daughter last night who is 4.She also says she is up about 2 x at night to void, and has some UI at times. PCP is [facility].  Current Medications, Allergies, Past Medical History, Past Surgical History, Family History and Social History were reviewed in [employer] [employer] record.     Review of Systems: Patient denies any headaches, hearing loss, fatigue, blurred vision, shortness of breath, chest pain, abdominal pain, problems with or intercourse. No joint pain or mood swings.See HPI for positives.   Physical Exam:BP 140/90 mmHg  Pulse 72  Ht 5' 2.5" (1.588 m)  Wt 157 lb (71.215 kg)  BMI 28.24 kg/m2 General:  Well developed, well nourished, no acute distress Skin:  Warm and dry Neck:  Midline trachea, normal thyroid, good ROM, no lymphadenopathy, no carotid bruits heard  Lungs; Clear to auscultation bilaterally Breast:  No dominant palpable mass, retraction, or nipple discharge Cardiovascular: Regular rate and rhythm Abdomen:  Soft, non tender, no hepatosplenomegaly Pelvic:  External genitalia is normal in appearance, no lesions.  The vagina is pale with loss of moisture and rugae. Urethra has no lesions or masses. The cervix is atrophic.  Uterus is felt to be normal size, shape, and contour.  No adnexal masses or  tenderness noted.Bladder is non tender, no masses felt. Rectal: Good sphincter tone, no polyps, or hemorrhoids felt.  Hemoccult negative. Extremities/musculoskeletal:  No swelling or varicosities noted, no clubbing or cyanosis Psych:  No mood changes, alert and cooperative,seems happy Encouraged to keep active.  Impression: Well woman gyn exam no pap Constipation     Plan: Try 1/2 cup prune juice and 1/2 cup applesauce and 1/2 cup real oat meal every day Increase water Labs with PCP Pap and physical in 2 years Mammogram yearly Limit spicy foods,caffeine and alcohol, void before bed and often during the day

## 2015-12-06 NOTE — Patient Instructions (Signed)
Labs with PCP Mammogram yearly Physical in 2 years  Try 1/2 cup prune juice,1/2 cup apple sauce and 1/2 cup real oatmeal  Increase water  Constipation, Adult Constipation is when a person has fewer than three bowel movements a week, has difficulty having a bowel movement, or has stools that are dry, hard, or larger than normal. As people grow older, constipation is more common. A low-fiber diet, not taking in enough fluids, and taking certain medicines may make constipation worse.  CAUSES   Certain medicines, such as antidepressants, pain medicine, iron supplements, antacids, and water pills.   Certain diseases, such as diabetes, irritable bowel syndrome (IBS), thyroid disease, or depression.   Not drinking enough water.   Not eating enough fiber-rich foods.   Stress or travel.   Lack of physical activity or exercise.   Ignoring the urge to have a bowel movement.   Using laxatives too much.  SIGNS AND SYMPTOMS   Having fewer than three bowel movements a week.   Straining to have a bowel movement.   Having stools that are hard, dry, or larger than normal.   Feeling full or bloated.   Pain in the lower abdomen.   Not feeling relief after having a bowel movement.  DIAGNOSIS  Your health care provider will take a medical history and perform a physical exam. Further testing may be done for severe constipation. Some tests may include:  A barium enema X-ray to examine your rectum, colon, and, sometimes, your small intestine.   A sigmoidoscopy to examine your lower colon.   A colonoscopy to examine your entire colon. TREATMENT  Treatment will depend on the severity of your constipation and what is causing it. Some dietary treatments include drinking more fluids and eating more fiber-rich foods. Lifestyle treatments may include regular exercise. If these diet and lifestyle recommendations do not help, your health care provider may recommend taking over-the-counter  laxative medicines to help you have bowel movements. Prescription medicines may be prescribed if over-the-counter medicines do not work.  HOME CARE INSTRUCTIONS   Eat foods that have a lot of fiber, such as fruits, vegetables, whole grains, and beans.  Limit foods high in fat and processed sugars, such as french fries, hamburgers, cookies, candies, and soda.   A fiber supplement may be added to your diet if you cannot get enough fiber from foods.   Drink enough fluids to keep your urine clear or pale yellow.   Exercise regularly or as directed by your health care provider.   Go to the restroom when you have the urge to go. Do not hold it.   Only take over-the-counter or prescription medicines as directed by your health care provider. Do not take other medicines for constipation without talking to your health care provider first.  [place] IF:   You have bright red blood in your stool.   Your constipation lasts for more than 4 days or gets worse.   You have abdominal or rectal pain.   You have thin, pencil-like stools.   You have unexplained weight loss. MAKE SURE YOU:   Understand these instructions.  Will watch your condition.  Will get help right away if you are not doing well or get worse.   This information is not intended to replace advice given to you by your health care provider. Make sure you discuss any questions you have with your health care provider.   Document Released: 08/16/2004 Document Revised: 12/09/2014 Document Reviewed: 08/30/2013 Elsevier  Interactive Patient Education 2016 Elsevier Inc.  

## 2015-12-11 DIAGNOSIS — M47814 Spondylosis without myelopathy or radiculopathy, thoracic region: Secondary | ICD-10-CM | POA: Diagnosis not present

## 2015-12-11 DIAGNOSIS — G894 Chronic pain syndrome: Secondary | ICD-10-CM | POA: Diagnosis not present

## 2015-12-11 DIAGNOSIS — M47817 Spondylosis without myelopathy or radiculopathy, lumbosacral region: Secondary | ICD-10-CM | POA: Diagnosis not present

## 2015-12-11 DIAGNOSIS — M545 Low back pain: Secondary | ICD-10-CM | POA: Diagnosis not present

## 2015-12-11 DIAGNOSIS — M546 Pain in thoracic spine: Secondary | ICD-10-CM | POA: Diagnosis not present

## 2015-12-12 DIAGNOSIS — M545 Low back pain: Secondary | ICD-10-CM | POA: Diagnosis not present

## 2015-12-12 DIAGNOSIS — G894 Chronic pain syndrome: Secondary | ICD-10-CM | POA: Diagnosis not present

## 2015-12-12 DIAGNOSIS — M47817 Spondylosis without myelopathy or radiculopathy, lumbosacral region: Secondary | ICD-10-CM | POA: Diagnosis not present

## 2015-12-21 DIAGNOSIS — I10 Essential (primary) hypertension: Secondary | ICD-10-CM | POA: Diagnosis not present

## 2015-12-21 DIAGNOSIS — R7301 Impaired fasting glucose: Secondary | ICD-10-CM | POA: Diagnosis not present

## 2015-12-21 DIAGNOSIS — E039 Hypothyroidism, unspecified: Secondary | ICD-10-CM | POA: Diagnosis not present

## 2015-12-30 DIAGNOSIS — J449 Chronic obstructive pulmonary disease, unspecified: Secondary | ICD-10-CM | POA: Diagnosis not present

## 2015-12-30 DIAGNOSIS — I10 Essential (primary) hypertension: Secondary | ICD-10-CM | POA: Diagnosis not present

## 2015-12-30 DIAGNOSIS — R7301 Impaired fasting glucose: Secondary | ICD-10-CM | POA: Diagnosis not present

## 2015-12-30 DIAGNOSIS — L509 Urticaria, unspecified: Secondary | ICD-10-CM | POA: Diagnosis not present

## 2015-12-30 DIAGNOSIS — F411 Generalized anxiety disorder: Secondary | ICD-10-CM | POA: Diagnosis not present

## 2016-01-15 DIAGNOSIS — G894 Chronic pain syndrome: Secondary | ICD-10-CM | POA: Diagnosis not present

## 2016-01-15 DIAGNOSIS — M47817 Spondylosis without myelopathy or radiculopathy, lumbosacral region: Secondary | ICD-10-CM | POA: Diagnosis not present

## 2016-01-15 DIAGNOSIS — M545 Low back pain: Secondary | ICD-10-CM | POA: Diagnosis not present

## 2016-01-17 DIAGNOSIS — M47817 Spondylosis without myelopathy or radiculopathy, lumbosacral region: Secondary | ICD-10-CM | POA: Diagnosis not present

## 2016-01-17 DIAGNOSIS — G894 Chronic pain syndrome: Secondary | ICD-10-CM | POA: Diagnosis not present

## 2016-01-17 DIAGNOSIS — M545 Low back pain: Secondary | ICD-10-CM | POA: Diagnosis not present

## 2016-01-31 DIAGNOSIS — G894 Chronic pain syndrome: Secondary | ICD-10-CM | POA: Diagnosis not present

## 2016-01-31 DIAGNOSIS — M545 Low back pain: Secondary | ICD-10-CM | POA: Diagnosis not present

## 2016-01-31 DIAGNOSIS — M47817 Spondylosis without myelopathy or radiculopathy, lumbosacral region: Secondary | ICD-10-CM | POA: Diagnosis not present

## 2016-02-06 DIAGNOSIS — R05 Cough: Secondary | ICD-10-CM | POA: Diagnosis not present

## 2016-02-06 DIAGNOSIS — J00 Acute nasopharyngitis [common cold]: Secondary | ICD-10-CM | POA: Diagnosis not present

## 2016-02-14 DIAGNOSIS — G894 Chronic pain syndrome: Secondary | ICD-10-CM | POA: Diagnosis not present

## 2016-02-14 DIAGNOSIS — M546 Pain in thoracic spine: Secondary | ICD-10-CM | POA: Diagnosis not present

## 2016-02-14 DIAGNOSIS — M47817 Spondylosis without myelopathy or radiculopathy, lumbosacral region: Secondary | ICD-10-CM | POA: Diagnosis not present

## 2016-02-14 DIAGNOSIS — M47814 Spondylosis without myelopathy or radiculopathy, thoracic region: Secondary | ICD-10-CM | POA: Diagnosis not present

## 2016-03-01 DIAGNOSIS — J0101 Acute recurrent maxillary sinusitis: Secondary | ICD-10-CM | POA: Diagnosis not present

## 2016-04-08 ENCOUNTER — Other Ambulatory Visit: Payer: Self-pay | Admitting: Adult Health

## 2016-04-10 DIAGNOSIS — K120 Recurrent oral aphthae: Secondary | ICD-10-CM | POA: Diagnosis not present

## 2016-04-15 DIAGNOSIS — K120 Recurrent oral aphthae: Secondary | ICD-10-CM | POA: Diagnosis not present

## 2016-05-08 DIAGNOSIS — M47816 Spondylosis without myelopathy or radiculopathy, lumbar region: Secondary | ICD-10-CM | POA: Diagnosis not present

## 2016-07-03 DIAGNOSIS — M797 Fibromyalgia: Secondary | ICD-10-CM | POA: Diagnosis not present

## 2016-07-09 DIAGNOSIS — I739 Peripheral vascular disease, unspecified: Secondary | ICD-10-CM | POA: Diagnosis not present

## 2016-07-09 DIAGNOSIS — M25571 Pain in right ankle and joints of right foot: Secondary | ICD-10-CM | POA: Diagnosis not present

## 2016-07-09 DIAGNOSIS — L84 Corns and callosities: Secondary | ICD-10-CM | POA: Diagnosis not present

## 2016-07-10 DIAGNOSIS — R7301 Impaired fasting glucose: Secondary | ICD-10-CM | POA: Diagnosis not present

## 2016-07-10 DIAGNOSIS — E782 Mixed hyperlipidemia: Secondary | ICD-10-CM | POA: Diagnosis not present

## 2016-07-10 DIAGNOSIS — E039 Hypothyroidism, unspecified: Secondary | ICD-10-CM | POA: Diagnosis not present

## 2016-07-15 DIAGNOSIS — I10 Essential (primary) hypertension: Secondary | ICD-10-CM | POA: Diagnosis not present

## 2016-07-15 DIAGNOSIS — E119 Type 2 diabetes mellitus without complications: Secondary | ICD-10-CM | POA: Diagnosis not present

## 2016-07-15 DIAGNOSIS — G4709 Other insomnia: Secondary | ICD-10-CM | POA: Diagnosis not present

## 2016-07-15 DIAGNOSIS — E039 Hypothyroidism, unspecified: Secondary | ICD-10-CM | POA: Diagnosis not present

## 2016-07-15 DIAGNOSIS — J449 Chronic obstructive pulmonary disease, unspecified: Secondary | ICD-10-CM | POA: Diagnosis not present

## 2016-07-30 ENCOUNTER — Other Ambulatory Visit: Payer: Self-pay

## 2016-08-01 ENCOUNTER — Telehealth: Payer: Self-pay | Admitting: Internal Medicine

## 2016-08-01 NOTE — Telephone Encounter (Signed)
Pt is on the Oct recall list for a 10 yr colonoscopy

## 2016-08-01 NOTE — Telephone Encounter (Signed)
Letter mailed

## 2016-08-06 ENCOUNTER — Encounter: Payer: Self-pay | Admitting: Adult Health

## 2016-08-06 DIAGNOSIS — Z1231 Encounter for screening mammogram for malignant neoplasm of breast: Secondary | ICD-10-CM | POA: Diagnosis not present

## 2016-08-19 ENCOUNTER — Telehealth: Payer: Self-pay

## 2016-08-19 NOTE — Telephone Encounter (Signed)
Pt called asking to speak with DS. I told her DS was with patients. Pt received a letter to be triaged and I told her DS does her triages in the afternoon between 130-430pm. Pt said she had another appt today in [place] at 130 and she would call DS back. I told her that DS has several triages and she would be in touch with her. Pt said that she would still call this afternoon.  [phone]

## 2016-09-02 ENCOUNTER — Telehealth: Payer: Self-pay

## 2016-09-02 NOTE — Telephone Encounter (Signed)
Will need OV prior to scheduleing due to meds, possible need for augmented sedation.

## 2016-09-02 NOTE — Telephone Encounter (Signed)
Gastroenterology Pre-Procedure Review  Request Date: 09/02/2016 Requesting Physician: ON RECALL    LAST COLONOSCOPY WAS 09/17/2006 BY DR. Gala Romney  PCP  DR. ZACK HALL  PATIENT REVIEW QUESTIONS: The patient responded to the following health history questions as indicated:    1. Diabetes Melitis: no 2. Joint replacements in the past 12 months: no 3. Major health problems in the past 3 months: no 4. Has an artificial valve or MVP: no 5. Has a defibrillator: no 6. Has been advised in past to take antibiotics in advance of a procedure like teeth cleaning: no 7. Family history of colon cancer: no  8. Alcohol Use: no 9. History of sleep apnea: no     MEDICATIONS & ALLERGIES:    Patient reports the following regarding taking any blood thinners:   Plavix? no Aspirin? yes Coumadin? no  Patient confirms/reports the following medications:  Current Outpatient Prescriptions  Medication Sig Dispense Refill  . albuterol (PROAIR HFA) 108 (90 BASE) MCG/ACT inhaler Inhale 1-2 puffs into the lungs every 6 (six) hours as needed for wheezing or shortness of breath.    . ALPRAZolam (XANAX) 0.5 MG tablet Take 0.5 mg by mouth 2 (two) times daily as needed for anxiety. Only takes one at bedtime    . amLODipine (NORVASC) 2.5 MG tablet Take 2.5 mg by mouth every morning.     Marland Kitchen aspirin 81 MG tablet Take 81 mg by mouth daily.    Marland Kitchen gabapentin (NEURONTIN) 100 MG capsule Take 1 capsule (100 mg total) by mouth every 8 (eight) hours as needed. 60 capsule 3  . hydrochlorothiazide (HYDRODIURIL) 25 MG tablet Take 1 tablet by mouth daily.    Marland Kitchen levothyroxine (SYNTHROID, LEVOTHROID) 125 MCG tablet Take 62.5 mcg by mouth daily.     Marland Kitchen losartan (COZAAR) 50 MG tablet Take 25 mg by mouth daily.    . NON FORMULARY Magnesium Oxide  500 mg one tablet at bedtime    . raloxifene (EVISTA) 60 MG tablet TAKE 1 TABLET BY MOUTH EVERY MORNING. (Patient taking differently: Takes 1/2 tablet daily) 90 tablet 3  . SPIRIVA HANDIHALER 18 MCG  inhalation capsule Place 18 mcg into inhaler and inhale daily.     . traMADol (ULTRAM) 50 MG tablet Take 1 tablet by mouth daily as needed for moderate pain or severe pain.     Marland Kitchen lidocaine (XYLOCAINE) 2 % solution Use as directed in the mouth or throat every 3 (three) hours as needed.     . Multiple Vitamins-Minerals (CENTRUM SILVER PO) Take by mouth daily.      No current facility-administered medications for this visit.     Patient confirms/reports the following allergies:  Allergies  Allergen Reactions  . Augmentin [Amoxicillin-Pot Clavulanate]   . Ace Inhibitors   . Entex La     No orders of the defined types were placed in this encounter.   AUTHORIZATION INFORMATION Primary Insurance:   ID #:   Group #:  Pre-Cert / Auth required:  Pre-Cert / Auth #:   Secondary Insurance:   ID #:   Group #:  Pre-Cert / Auth required:  Pre-Cert / Auth #:   SCHEDULE INFORMATION: Procedure has been scheduled as follows:  Date:             Time:   Location:   This Gastroenterology Pre-Precedure Review Form is being routed to the following provider(s): R. Garfield Cornea, MD

## 2016-09-02 NOTE — Telephone Encounter (Signed)
See separate triage.  

## 2016-09-03 NOTE — Telephone Encounter (Signed)
PT is scheduled for an OV with Walden Field, NP on 09/23/2016 at 11:00 AM due to meds.

## 2016-09-04 DIAGNOSIS — Z23 Encounter for immunization: Secondary | ICD-10-CM | POA: Diagnosis not present

## 2016-09-13 DIAGNOSIS — K120 Recurrent oral aphthae: Secondary | ICD-10-CM | POA: Diagnosis not present

## 2016-09-13 DIAGNOSIS — Z6828 Body mass index (BMI) 28.0-28.9, adult: Secondary | ICD-10-CM | POA: Diagnosis not present

## 2016-09-23 ENCOUNTER — Ambulatory Visit: Payer: Medicare Other | Admitting: Nurse Practitioner

## 2016-09-25 ENCOUNTER — Ambulatory Visit (INDEPENDENT_AMBULATORY_CARE_PROVIDER_SITE_OTHER): Payer: Medicare Other | Admitting: Nurse Practitioner

## 2016-09-25 ENCOUNTER — Encounter: Payer: Self-pay | Admitting: Nurse Practitioner

## 2016-09-25 ENCOUNTER — Other Ambulatory Visit: Payer: Self-pay

## 2016-09-25 VITALS — BP 137/78 | HR 76 | Temp 97.9°F | Ht 64.0 in | Wt 165.0 lb

## 2016-09-25 DIAGNOSIS — Z79899 Other long term (current) drug therapy: Secondary | ICD-10-CM

## 2016-09-25 DIAGNOSIS — K5900 Constipation, unspecified: Secondary | ICD-10-CM

## 2016-09-25 DIAGNOSIS — Z1211 Encounter for screening for malignant neoplasm of colon: Secondary | ICD-10-CM

## 2016-09-25 MED ORDER — SOD PICOSULFATE-MAG OX-CIT ACD 10-3.5-12 MG-GM-GM PO PACK: 1.0000 | PACK | ORAL | 0 refills | Status: DC

## 2016-09-25 NOTE — Patient Instructions (Signed)
1. We will schedule your procedure for you. 2. Continue your current medications. 3. Return for follow-up based on recommendations made after your procedure.

## 2016-09-25 NOTE — Progress Notes (Signed)
cc'ed to pcp °

## 2016-09-25 NOTE — Assessment & Plan Note (Signed)
Symptoms currently well-controlled on regimen of prune juice and stool softener. Continue to monitor, follow up as needed.

## 2016-09-25 NOTE — Assessment & Plan Note (Signed)
Patient is currently due for screening colonoscopy. She was brought in for office visit due to use of sedating medicines/polypharmacy including Xanax, Benadryl, gabapentin, and tramadol. We will proceed with the screening colonoscopy as previous he recommended with some augmented sedation. Return for follow-up based on postprocedure recommendations.  Proceed with TCS with 12.5mg  preprocedure Phenergan with Dr. Gala Romney in near future: the risks, benefits, and alternatives have been discussed with the patient in detail. The patient states understanding and desires to proceed.  The patient is currently on Xanax, Benadryl, gabapentin, and tramadol. No other anticoagulants, anxiolytics, chronic pain medications, or antidepressants. The patient denies alcohol and drug use. We will add 12.5 mg preprocedure Phenergan to promote adequate sedation.

## 2016-09-25 NOTE — Progress Notes (Signed)
Primary Care Physician:  Wende Neighbors, MD Primary Gastroenterologist:  Dr. Gala Romney  Chief Complaint  Patient presents with  . Colonoscopy    due    HPI:   [name] is a 75 y.o. female who presents to schedule screening colonoscopy. Phone triage was deferred to an office evaluation due to polypharmacy including Xanax, Neurontin, Ultram. Last colonoscopy completed 09/17/2006 for screening purposes and found anal papilla, internal hemorrhoids, otherwise normal rectum and colon. Recommended repeat colonoscopy in 10 years for screening purposes.  Today he states she's doing well. Denies abdominal pain, N/V, hematochezia, melena, unintentional weight loss, fever, chills, sudden changes in bowel habits. Has chronic constipation currently managed with prune juice twice a day and stool softener daily. Denies chest pain, dyspnea, dizziness, lightheadedness, syncope, near syncope. Denies any other upper or lower GI symptoms.  Past Medical History:  Diagnosis Date  . Anemia   . Arthritis   . Constipation   . COPD (chronic obstructive pulmonary disease) ([place])   . High blood pressure   . Seasonal allergies   . Thyroid disease   . Varicose veins   . Vertigo 11/23/2013    Past Surgical History:  Procedure Laterality Date  . APPENDECTOMY    . CHOLECYSTECTOMY    . TUBAL LIGATION      Current Outpatient Prescriptions  Medication Sig Dispense Refill  . albuterol (PROAIR HFA) 108 (90 BASE) MCG/ACT inhaler Inhale 1-2 puffs into the lungs every 6 (six) hours as needed for wheezing or shortness of breath.    . ALPRAZolam (XANAX) 0.5 MG tablet Take 0.5 mg by mouth 2 (two) times daily as needed for anxiety. Only takes one at bedtime    . amLODipine (NORVASC) 2.5 MG tablet Take 2.5 mg by mouth every morning.     Marland Kitchen aspirin 81 MG tablet Take 81 mg by mouth daily.    Marland Kitchen gabapentin (NEURONTIN) 100 MG capsule Take 1 capsule (100 mg total) by mouth every 8 (eight) hours as needed. 60 capsule 3  .  hydrochlorothiazide (HYDRODIURIL) 25 MG tablet Take 1 tablet by mouth daily.    Marland Kitchen levothyroxine (SYNTHROID, LEVOTHROID) 125 MCG tablet Take 62.5 mcg by mouth daily.     Marland Kitchen lidocaine (XYLOCAINE) 2 % solution Use as directed in the mouth or throat every 3 (three) hours as needed.     Marland Kitchen losartan (COZAAR) 50 MG tablet Take 25 mg by mouth daily.    . NON FORMULARY Magnesium Oxide  500 mg one tablet at bedtime    . NON FORMULARY Takes stool softener 100 mg at bedtime (unsure of name)    . NON FORMULARY Antihistamine 10 mg one a day (unsure of name, OTC).    . raloxifene (EVISTA) 60 MG tablet TAKE 1 TABLET BY MOUTH EVERY MORNING. (Patient taking differently: Takes 1/2 tablet daily) 90 tablet 3  . SPIRIVA HANDIHALER 18 MCG inhalation capsule Place 18 mcg into inhaler and inhale daily.     . traMADol (ULTRAM) 50 MG tablet Take 1 tablet by mouth daily as needed for moderate pain or severe pain.     . vitamin E 400 UNIT capsule Take 400 Units by mouth daily.     No current facility-administered medications for this visit.     Allergies as of 09/25/2016 - Review Complete 09/25/2016  Allergen Reaction Noted  . Augmentin [amoxicillin-pot clavulanate]  10/04/2013  . Ace inhibitors    . Entex la      Family History  Problem Relation  Age of Onset  . Hypertension Mother   . Varicose Veins Father   . Heart attack Father   . Heart disease Father   . Hypertension Brother   . COPD Brother     Social History   Social History  . Marital status: Married    Spouse name: N/A  . Number of children: N/A  . Years of education: N/A   Occupational History  . Not on file.   Social History Main Topics  . Smoking status: Former Smoker    Types: Cigarettes    Quit date: 12/02/1997  . Smokeless tobacco: Never Used  . Alcohol use No  . Drug use: No  . Sexual activity: Yes    Birth control/ protection: Surgical     Comment: tubal   Other Topics Concern  . Not on file   Social History Narrative  . No  narrative on file    Review of Systems: General: Negative for anorexia, weight loss, fever, chills, fatigue, weakness. ENT: Negative for hoarseness, difficulty swallowing. CV: Negative for chest pain, angina, palpitations, peripheral edema.  Respiratory: Negative for dyspnea at rest, cough, sputum, wheezing.  GI: See history of present illness. Derm: Negative for rash or itching.  MS: No overt chronic pain. Endo: Negative for unusual weight change.  Heme: Negative for bruising or bleeding. Allergy: Negative for rash or hives.    Physical Exam: BP 137/78   Pulse 76   Temp 97.9 F (36.6 C) (Oral)   Ht 5\' 4"  (1.626 m)   Wt 165 lb (74.8 kg)   BMI 28.32 kg/m  General:   Alert and oriented. Pleasant and cooperative. Well-nourished and well-developed.  Head:  Normocephalic and atraumatic. Eyes:  Without icterus, sclera clear and conjunctiva pink.  Ears:  Normal auditory acuity. Cardiovascular:  S1, S2 present without murmurs appreciated. Normal pulses noted. Extremities without clubbing or edema. Respiratory:  Clear to auscultation bilaterally. No wheezes, rales, or rhonchi. No distress.  Gastrointestinal:  +BS, soft, non-tender and non-distended. No HSM noted. No guarding or rebound. No masses appreciated.  Rectal:  Deferred  Musculoskalatal:  Symmetrical without gross deformities. Neurologic:  Alert and oriented x4;  grossly normal neurologically. Psych:  Alert and cooperative. Normal mood and affect. Heme/Lymph/Immune: No excessive bruising noted.    09/25/2016 11:29 AM   Disclaimer: This note was dictated with voice recognition software. Similar sounding words can inadvertently be transcribed and may not be corrected upon review.

## 2016-09-26 ENCOUNTER — Other Ambulatory Visit: Payer: Self-pay

## 2016-09-26 MED ORDER — NA SULFATE-K SULFATE-MG SULF 17.5-3.13-1.6 GM/177ML PO SOLN
1.0000 | ORAL | 0 refills | Status: DC
Start: 2016-09-26 — End: 2017-01-24

## 2016-10-01 ENCOUNTER — Encounter (HOSPITAL_COMMUNITY)
Admission: RE | Disposition: A | Discharge status: Home / Self Care | Payer: Self-pay | Source: Ambulatory Visit | Attending: Internal Medicine

## 2016-10-01 ENCOUNTER — Encounter (HOSPITAL_COMMUNITY): Payer: Self-pay

## 2016-10-01 ENCOUNTER — Ambulatory Visit (HOSPITAL_COMMUNITY)
Admission: RE | Admit: 2016-10-01 | Discharge: 2016-10-01 | Disposition: A | Discharge status: Home / Self Care | Payer: Medicare Other | Source: Ambulatory Visit | Attending: Internal Medicine | Admitting: Internal Medicine

## 2016-10-01 DIAGNOSIS — J449 Chronic obstructive pulmonary disease, unspecified: Secondary | ICD-10-CM | POA: Insufficient documentation

## 2016-10-01 DIAGNOSIS — Z1212 Encounter for screening for malignant neoplasm of rectum: Secondary | ICD-10-CM | POA: Diagnosis not present

## 2016-10-01 DIAGNOSIS — Z87891 Personal history of nicotine dependence: Secondary | ICD-10-CM | POA: Insufficient documentation

## 2016-10-01 DIAGNOSIS — K648 Other hemorrhoids: Secondary | ICD-10-CM | POA: Insufficient documentation

## 2016-10-01 DIAGNOSIS — K6389 Other specified diseases of intestine: Secondary | ICD-10-CM | POA: Diagnosis not present

## 2016-10-01 DIAGNOSIS — Z1211 Encounter for screening for malignant neoplasm of colon: Secondary | ICD-10-CM | POA: Insufficient documentation

## 2016-10-01 DIAGNOSIS — K5730 Diverticulosis of large intestine without perforation or abscess without bleeding: Secondary | ICD-10-CM | POA: Diagnosis not present

## 2016-10-01 DIAGNOSIS — Z7982 Long term (current) use of aspirin: Secondary | ICD-10-CM | POA: Diagnosis not present

## 2016-10-01 HISTORY — PX: COLONOSCOPY: SHX5424

## 2016-10-01 SURGERY — COLONOSCOPY
Anesthesia: Moderate Sedation

## 2016-10-01 MED ORDER — STERILE WATER FOR IRRIGATION IR SOLN
Status: DC | PRN
  Administered 2016-10-01: 2.5 mL

## 2016-10-01 MED ORDER — MIDAZOLAM HCL 5 MG/5ML IJ SOLN
INTRAMUSCULAR | Status: AC
  Filled 2016-10-01: qty 10

## 2016-10-01 MED ORDER — SODIUM CHLORIDE 0.9 % IV SOLN
INTRAVENOUS | Status: DC
  Administered 2016-10-01: 08:00:00 via INTRAVENOUS

## 2016-10-01 MED ORDER — MEPERIDINE HCL 100 MG/ML IJ SOLN
INTRAMUSCULAR | Status: DC | PRN
  Administered 2016-10-01: 50 mg via INTRAVENOUS

## 2016-10-01 MED ORDER — ONDANSETRON HCL 4 MG/2ML IJ SOLN
INTRAMUSCULAR | Status: DC
Start: 2016-10-01 — End: 2016-10-01
  Filled 2016-10-01: qty 2

## 2016-10-01 MED ORDER — ONDANSETRON HCL 4 MG/2ML IJ SOLN
INTRAMUSCULAR | Status: DC | PRN
  Administered 2016-10-01: 4 mg via INTRAVENOUS

## 2016-10-01 MED ORDER — SODIUM CHLORIDE 0.9% FLUSH
INTRAVENOUS | Status: AC
  Filled 2016-10-01: qty 10

## 2016-10-01 MED ORDER — PROMETHAZINE HCL 25 MG/ML IJ SOLN
12.5000 mg | Freq: Once | INTRAMUSCULAR | Status: AC
  Administered 2016-10-01: 12.5 mg via INTRAVENOUS

## 2016-10-01 MED ORDER — PROMETHAZINE HCL 25 MG/ML IJ SOLN
INTRAMUSCULAR | Status: AC
  Filled 2016-10-01: qty 1

## 2016-10-01 MED ORDER — MIDAZOLAM HCL 5 MG/5ML IJ SOLN
INTRAMUSCULAR | Status: DC | PRN
  Administered 2016-10-01 (×2): 1 mg via INTRAVENOUS
  Administered 2016-10-01: 2 mg via INTRAVENOUS

## 2016-10-01 MED ORDER — MEPERIDINE HCL 100 MG/ML IJ SOLN
INTRAMUSCULAR | Status: AC
  Filled 2016-10-01: qty 2

## 2016-10-01 NOTE — Discharge Instructions (Addendum)
Colonoscopy Discharge Instructions  Read the instructions outlined below and refer to this sheet in the next few weeks. These discharge instructions provide you with general information on caring for yourself after you leave the hospital. Your doctor may also give you specific instructions. While your treatment has been planned according to the most current medical practices available, unavoidable complications occasionally occur. If you have any problems or questions after discharge, call Dr. Gala Romney at [phone]. ACTIVITY  You may resume your regular activity, but move at a slower pace for the next 24 hours.   Take frequent rest periods for the next 24 hours.   Walking will help get rid of the air and reduce the bloated feeling in your belly (abdomen).   No driving for 24 hours (because of the medicine (anesthesia) used during the test).    Do not sign any important legal documents or operate any machinery for 24 hours (because of the anesthesia used during the test).  NUTRITION  Drink plenty of fluids.   You may resume your normal diet as instructed by your doctor.   Begin with a light meal and progress to your normal diet. Heavy or fried foods are harder to digest and may make you feel sick to your stomach (nauseated).   Avoid alcoholic beverages for 24 hours or as instructed.  MEDICATIONS  You may resume your normal medications unless your doctor tells you otherwise.  WHAT YOU CAN EXPECT TODAY  Some feelings of bloating in the abdomen.   Passage of more gas than usual.   Spotting of blood in your stool or on the toilet paper.  IF YOU HAD POLYPS REMOVED DURING THE COLONOSCOPY:  No aspirin products for 7 days or as instructed.   No alcohol for 7 days or as instructed.   Eat a soft diet for the next 24 hours.  FINDING OUT THE RESULTS OF YOUR TEST Not all test results are available during your visit. If your test results are not back during the visit, make an appointment  with your caregiver to find out the results. Do not assume everything is normal if you have not heard from your caregiver or the medical facility. It is important for you to follow up on all of your test results.  SEEK IMMEDIATE MEDICAL ATTENTION IF:  You have more than a spotting of blood in your stool.   Your belly is swollen (abdominal distention).   You are nauseated or vomiting.   You have a temperature over 101.   You have abdominal pain or discomfort that is severe or gets worse throughout the day.     Diverticulosis information provided  I do not recommend a future colonoscopy unless new symptoms develop   Diverticulosis Diverticulosis is the condition that develops when small pouches (diverticula) form in the wall of your colon. Your colon, or large intestine, is where water is absorbed and stool is formed. The pouches form when the inside layer of your colon pushes through weak spots in the outer layers of your colon. CAUSES  No one knows exactly what causes diverticulosis. RISK FACTORS  Being older than 37. Your risk for this condition increases with age. Diverticulosis is rare in people younger than 40 years. By age 64, almost everyone has it.  Eating a low-fiber diet.  Being frequently constipated.  Being overweight.  Not getting enough exercise.  Smoking.  Taking over-the-counter pain medicines, like aspirin and ibuprofen. SYMPTOMS  Most people with diverticulosis do not have symptoms. DIAGNOSIS  Because diverticulosis often has no symptoms, health care providers often discover the condition during an exam for other colon problems. In many cases, a health care provider will diagnose diverticulosis while using a flexible scope to examine the colon (colonoscopy). TREATMENT  If you have never developed an infection related to diverticulosis, you may not need treatment. If you have had an infection before, treatment may include:  Eating more fruits, vegetables,  and grains.  Taking a fiber supplement.  Taking a live bacteria supplement (probiotic).  Taking medicine to relax your colon. HOME CARE INSTRUCTIONS   Drink at least 6-8 glasses of water each day to prevent constipation.  Try not to strain when you have a bowel movement.  Keep all follow-up appointments. If you have had an infection before:  Increase the fiber in your diet as directed by your health care provider or dietitian.  Take a dietary fiber supplement if your health care provider approves.  Only take medicines as directed by your health care provider. SEEK MEDICAL CARE IF:   You have abdominal pain.  You have bloating.  You have cramps.  You have not gone to the bathroom in 3 days. SEEK IMMEDIATE MEDICAL CARE IF:   Your pain gets worse.  Yourbloating becomes very bad.  You have a fever or chills, and your symptoms suddenly get worse.  You begin vomiting.  You have bowel movements that are bloody or black. MAKE SURE YOU:  Understand these instructions.  Will watch your condition.  Will get help right away if you are not doing well or get worse.   This information is not intended to replace advice given to you by your health care provider. Make sure you discuss any questions you have with your health care provider.   Document Released: 08/15/2004 Document Revised: 11/23/2013 Document Reviewed: 10/13/2013 Elsevier Interactive Patient Education [employer].

## 2016-10-01 NOTE — Op Note (Signed)
[facility] Patient Name: [name] Procedure Date: 10/01/2016 8:03 AM MRN: [id number] Date of Birth: 10-02-1941 Attending MD: Norvel Richards , MD CSN: [id number] Age: 75 Admit Type: Outpatient Procedure:                Colonoscopy Indications:              Screening for colorectal malignant neoplasm Providers:                Norvel Richards, MD, Charlyne Petrin RN, RN,                            Randa Spike, Technician Referring MD:              Medicines:                Midazolam 4 mg IV, Meperidine 25 mg IV,                            Promethazine 25 mg IV, Ondansetron 4 mg IV Complications:            No immediate complications. Estimated Blood Loss:     Estimated blood loss: none. Procedure:                Pre-Anesthesia Assessment:                           - Prior to the procedure, a History and Physical                            was performed, and patient medications and                            allergies were reviewed. The patient's tolerance of                            previous anesthesia was also reviewed. The risks                            and benefits of the procedure and the sedation                            options and risks were discussed with the patient.                            All questions were answered, and informed consent                            was obtained. Prior Anticoagulants: The patient has                            taken no previous anticoagulant or antiplatelet                            agents. ASA Grade Assessment: II - A patient with  mild systemic disease. After reviewing the risks                            and benefits, the patient was deemed in                            satisfactory condition to undergo the procedure.                           After obtaining informed consent, the colonoscope                            was passed under direct vision. Throughout the                 procedure, the patient's blood pressure, pulse, and                            oxygen saturations were monitored continuously. The                            EC-[id number]) scope was introduced through                            the anus and advanced to the 5 cm into the ileum.                            The colonoscopy was performed without difficulty.                            The patient tolerated the procedure well. The                            quality of the bowel preparation was adequate. The                            terminal ileum, ileocecal valve, appendiceal                            orifice, and rectum were photographed. Scope In: 8:25:10 AM Scope Out: 8:40:52 AM Scope Withdrawal Time: 0 hours 9 minutes 3 seconds  Total Procedure Duration: 0 hours 15 minutes 42 seconds  Findings:      The perianal and digital rectal examinations were normal.      A few small and large-mouthed diverticula were found in the entire       colon. Estimated blood loss: none.      The exam was otherwise without abnormality on direct and retroflexion       views. Melanosis coli present. Impression:               - Diverticulosis in the entire examined colon.                            Melanosis coli.                           -  The examination was otherwise normal on direct                            and retroflexion views.                           - No specimens collected. Moderate Sedation:      Moderate (conscious) sedation was administered by the endoscopy nurse       and supervised by the endoscopist. The following parameters were       monitored: oxygen saturation, heart rate, blood pressure, respiratory       rate, EKG, adequacy of pulmonary ventilation, and response to care.       Total physician intraservice time was 15 minutes. Recommendation:           - Discharge patient to home.                           - Resume previous diet.                           -  Continue present medications.                           - Repeat colonoscopy is not recommended for                            screening purposes. Procedure Code(s):        --- Professional ---                           [phone], Colonoscopy, flexible; diagnostic, including                            collection of specimen(s) by brushing or washing,                            when performed (separate procedure)                           99152, Moderate sedation services provided by the                            same physician or other qualified health care                            professional performing the diagnostic or                            therapeutic service that the sedation supports,                            requiring the presence of an independent trained                            observer to assist in the monitoring of the  patient's level of consciousness and physiological                            status; initial 15 minutes of intraservice time,                            patient age 4 years or older Diagnosis Code(s):        --- Professional ---                           Z12.11, Encounter for screening for malignant                            neoplasm of colon                           K57.30, Diverticulosis of large intestine without                            perforation or abscess without bleeding CPT copyright 2016 American Medical Association. All rights reserved. The codes documented in this report are preliminary and upon coder review may  be revised to meet current compliance requirements. Cristopher Estimable. Rourk, MD Norvel Richards, MD 10/01/2016 8:54:59 AM This report has been signed electronically. Number of Addenda: 0

## 2016-10-01 NOTE — H&P (View-Only) (Signed)
Primary Care Physician:  Wende Neighbors, MD Primary Gastroenterologist:  Dr. Gala Romney  Chief Complaint  Patient presents with  . Colonoscopy    due    HPI:   [name] is a 75 y.o. female who presents to schedule screening colonoscopy. Phone triage was deferred to an office evaluation due to polypharmacy including Xanax, Neurontin, Ultram. Last colonoscopy completed 09/17/2006 for screening purposes and found anal papilla, internal hemorrhoids, otherwise normal rectum and colon. Recommended repeat colonoscopy in 10 years for screening purposes.  Today he states she's doing well. Denies abdominal pain, N/V, hematochezia, melena, unintentional weight loss, fever, chills, sudden changes in bowel habits. Has chronic constipation currently managed with prune juice twice a day and stool softener daily. Denies chest pain, dyspnea, dizziness, lightheadedness, syncope, near syncope. Denies any other upper or lower GI symptoms.  Past Medical History:  Diagnosis Date  . Anemia   . Arthritis   . Constipation   . COPD (chronic obstructive pulmonary disease) ([place])   . High blood pressure   . Seasonal allergies   . Thyroid disease   . Varicose veins   . Vertigo 11/23/2013    Past Surgical History:  Procedure Laterality Date  . APPENDECTOMY    . CHOLECYSTECTOMY    . TUBAL LIGATION      Current Outpatient Prescriptions  Medication Sig Dispense Refill  . albuterol (PROAIR HFA) 108 (90 BASE) MCG/ACT inhaler Inhale 1-2 puffs into the lungs every 6 (six) hours as needed for wheezing or shortness of breath.    . ALPRAZolam (XANAX) 0.5 MG tablet Take 0.5 mg by mouth 2 (two) times daily as needed for anxiety. Only takes one at bedtime    . amLODipine (NORVASC) 2.5 MG tablet Take 2.5 mg by mouth every morning.     Marland Kitchen aspirin 81 MG tablet Take 81 mg by mouth daily.    Marland Kitchen gabapentin (NEURONTIN) 100 MG capsule Take 1 capsule (100 mg total) by mouth every 8 (eight) hours as needed. 60 capsule 3  .  hydrochlorothiazide (HYDRODIURIL) 25 MG tablet Take 1 tablet by mouth daily.    Marland Kitchen levothyroxine (SYNTHROID, LEVOTHROID) 125 MCG tablet Take 62.5 mcg by mouth daily.     Marland Kitchen lidocaine (XYLOCAINE) 2 % solution Use as directed in the mouth or throat every 3 (three) hours as needed.     Marland Kitchen losartan (COZAAR) 50 MG tablet Take 25 mg by mouth daily.    . NON FORMULARY Magnesium Oxide  500 mg one tablet at bedtime    . NON FORMULARY Takes stool softener 100 mg at bedtime (unsure of name)    . NON FORMULARY Antihistamine 10 mg one a day (unsure of name, OTC).    . raloxifene (EVISTA) 60 MG tablet TAKE 1 TABLET BY MOUTH EVERY MORNING. (Patient taking differently: Takes 1/2 tablet daily) 90 tablet 3  . SPIRIVA HANDIHALER 18 MCG inhalation capsule Place 18 mcg into inhaler and inhale daily.     . traMADol (ULTRAM) 50 MG tablet Take 1 tablet by mouth daily as needed for moderate pain or severe pain.     . vitamin E 400 UNIT capsule Take 400 Units by mouth daily.     No current facility-administered medications for this visit.     Allergies as of 09/25/2016 - Review Complete 09/25/2016  Allergen Reaction Noted  . Augmentin [amoxicillin-pot clavulanate]  10/04/2013  . Ace inhibitors    . Entex la      Family History  Problem Relation  Age of Onset  . Hypertension Mother   . Varicose Veins Father   . Heart attack Father   . Heart disease Father   . Hypertension Brother   . COPD Brother     Social History   Social History  . Marital status: Married    Spouse name: N/A  . Number of children: N/A  . Years of education: N/A   Occupational History  . Not on file.   Social History Main Topics  . Smoking status: Former Smoker    Types: Cigarettes    Quit date: 12/02/1997  . Smokeless tobacco: Never Used  . Alcohol use No  . Drug use: No  . Sexual activity: Yes    Birth control/ protection: Surgical     Comment: tubal   Other Topics Concern  . Not on file   Social History Narrative  . No  narrative on file    Review of Systems: General: Negative for anorexia, weight loss, fever, chills, fatigue, weakness. ENT: Negative for hoarseness, difficulty swallowing. CV: Negative for chest pain, angina, palpitations, peripheral edema.  Respiratory: Negative for dyspnea at rest, cough, sputum, wheezing.  GI: See history of present illness. Derm: Negative for rash or itching.  MS: No overt chronic pain. Endo: Negative for unusual weight change.  Heme: Negative for bruising or bleeding. Allergy: Negative for rash or hives.    Physical Exam: BP 137/78   Pulse 76   Temp 97.9 F (36.6 C) (Oral)   Ht 5\' 4"  (1.626 m)   Wt 165 lb (74.8 kg)   BMI 28.32 kg/m  General:   Alert and oriented. Pleasant and cooperative. Well-nourished and well-developed.  Head:  Normocephalic and atraumatic. Eyes:  Without icterus, sclera clear and conjunctiva pink.  Ears:  Normal auditory acuity. Cardiovascular:  S1, S2 present without murmurs appreciated. Normal pulses noted. Extremities without clubbing or edema. Respiratory:  Clear to auscultation bilaterally. No wheezes, rales, or rhonchi. No distress.  Gastrointestinal:  +BS, soft, non-tender and non-distended. No HSM noted. No guarding or rebound. No masses appreciated.  Rectal:  Deferred  Musculoskalatal:  Symmetrical without gross deformities. Neurologic:  Alert and oriented x4;  grossly normal neurologically. Psych:  Alert and cooperative. Normal mood and affect. Heme/Lymph/Immune: No excessive bruising noted.    09/25/2016 11:29 AM   Disclaimer: This note was dictated with voice recognition software. Similar sounding words can inadvertently be transcribed and may not be corrected upon review.

## 2016-10-01 NOTE — Interval H&P Note (Signed)
History and Physical Interval Note:  10/01/2016 8:14 AM  [name]  has presented today for surgery, with the diagnosis of SCREENING  The various methods of treatment have been discussed with the patient and family. After consideration of risks, benefits and other options for treatment, the patient has consented to  Procedure(s) with comments: COLONOSCOPY (N/A) - 815  as a surgical intervention .  The patient's history has been reviewed, patient examined, no change in status, stable for surgery.  I have reviewed the patient's chart and labs.  Questions were answered to the patient's satisfaction.     Robert Rourk   No change. Screening colonoscopy plan.  The risks, benefits, limitations, alternatives and imponderables have been reviewed with the patient. Questions have been answered. All parties are agreeable.

## 2016-10-02 ENCOUNTER — Encounter (HOSPITAL_COMMUNITY): Payer: Self-pay | Admitting: Internal Medicine

## 2016-10-31 DIAGNOSIS — H2513 Age-related nuclear cataract, bilateral: Secondary | ICD-10-CM | POA: Diagnosis not present

## 2016-10-31 DIAGNOSIS — H52223 Regular astigmatism, bilateral: Secondary | ICD-10-CM | POA: Diagnosis not present

## 2016-10-31 DIAGNOSIS — H43813 Vitreous degeneration, bilateral: Secondary | ICD-10-CM | POA: Diagnosis not present

## 2016-10-31 DIAGNOSIS — H524 Presbyopia: Secondary | ICD-10-CM | POA: Diagnosis not present

## 2016-11-30 DIAGNOSIS — H6502 Acute serous otitis media, left ear: Secondary | ICD-10-CM | POA: Diagnosis not present

## 2016-11-30 DIAGNOSIS — R05 Cough: Secondary | ICD-10-CM | POA: Diagnosis not present

## 2016-12-04 ENCOUNTER — Ambulatory Visit (HOSPITAL_COMMUNITY)
Admission: RE | Admit: 2016-12-04 | Discharge: 2016-12-04 | Disposition: A | Discharge status: Home / Self Care | Payer: Medicare Other | Source: Ambulatory Visit | Attending: Adult Health Nurse Practitioner | Admitting: Adult Health Nurse Practitioner

## 2016-12-04 ENCOUNTER — Other Ambulatory Visit (HOSPITAL_COMMUNITY): Payer: Self-pay | Admitting: Adult Health Nurse Practitioner

## 2016-12-04 DIAGNOSIS — R05 Cough: Secondary | ICD-10-CM | POA: Diagnosis not present

## 2016-12-04 DIAGNOSIS — Z6826 Body mass index (BMI) 26.0-26.9, adult: Secondary | ICD-10-CM | POA: Diagnosis not present

## 2016-12-04 DIAGNOSIS — R059 Cough, unspecified: Secondary | ICD-10-CM

## 2016-12-04 DIAGNOSIS — J060 Acute laryngopharyngitis: Secondary | ICD-10-CM | POA: Diagnosis not present

## 2016-12-11 DIAGNOSIS — Z85828 Personal history of other malignant neoplasm of skin: Secondary | ICD-10-CM | POA: Diagnosis not present

## 2016-12-11 DIAGNOSIS — Z08 Encounter for follow-up examination after completed treatment for malignant neoplasm: Secondary | ICD-10-CM | POA: Diagnosis not present

## 2016-12-11 DIAGNOSIS — X32XXXD Exposure to sunlight, subsequent encounter: Secondary | ICD-10-CM | POA: Diagnosis not present

## 2016-12-11 DIAGNOSIS — L570 Actinic keratosis: Secondary | ICD-10-CM | POA: Diagnosis not present

## 2016-12-11 DIAGNOSIS — Z1283 Encounter for screening for malignant neoplasm of skin: Secondary | ICD-10-CM | POA: Diagnosis not present

## 2016-12-23 DIAGNOSIS — J449 Chronic obstructive pulmonary disease, unspecified: Secondary | ICD-10-CM | POA: Diagnosis not present

## 2016-12-23 DIAGNOSIS — J060 Acute laryngopharyngitis: Secondary | ICD-10-CM | POA: Diagnosis not present

## 2016-12-23 DIAGNOSIS — Z6827 Body mass index (BMI) 27.0-27.9, adult: Secondary | ICD-10-CM | POA: Diagnosis not present

## 2017-01-01 DIAGNOSIS — J329 Chronic sinusitis, unspecified: Secondary | ICD-10-CM | POA: Diagnosis not present

## 2017-01-01 DIAGNOSIS — J069 Acute upper respiratory infection, unspecified: Secondary | ICD-10-CM | POA: Diagnosis not present

## 2017-01-08 DIAGNOSIS — E039 Hypothyroidism, unspecified: Secondary | ICD-10-CM | POA: Diagnosis not present

## 2017-01-08 DIAGNOSIS — E782 Mixed hyperlipidemia: Secondary | ICD-10-CM | POA: Diagnosis not present

## 2017-01-08 DIAGNOSIS — E785 Hyperlipidemia, unspecified: Secondary | ICD-10-CM | POA: Diagnosis not present

## 2017-01-08 DIAGNOSIS — I482 Chronic atrial fibrillation: Secondary | ICD-10-CM | POA: Diagnosis not present

## 2017-01-08 DIAGNOSIS — I10 Essential (primary) hypertension: Secondary | ICD-10-CM | POA: Diagnosis not present

## 2017-01-08 DIAGNOSIS — E119 Type 2 diabetes mellitus without complications: Secondary | ICD-10-CM | POA: Diagnosis not present

## 2017-01-08 DIAGNOSIS — R7301 Impaired fasting glucose: Secondary | ICD-10-CM | POA: Diagnosis not present

## 2017-01-08 DIAGNOSIS — D509 Iron deficiency anemia, unspecified: Secondary | ICD-10-CM | POA: Diagnosis not present

## 2017-01-11 DIAGNOSIS — Z6827 Body mass index (BMI) 27.0-27.9, adult: Secondary | ICD-10-CM | POA: Diagnosis not present

## 2017-01-11 DIAGNOSIS — J449 Chronic obstructive pulmonary disease, unspecified: Secondary | ICD-10-CM | POA: Diagnosis not present

## 2017-01-11 DIAGNOSIS — E119 Type 2 diabetes mellitus without complications: Secondary | ICD-10-CM | POA: Diagnosis not present

## 2017-01-11 DIAGNOSIS — E785 Hyperlipidemia, unspecified: Secondary | ICD-10-CM | POA: Diagnosis not present

## 2017-01-11 DIAGNOSIS — I10 Essential (primary) hypertension: Secondary | ICD-10-CM | POA: Diagnosis not present

## 2017-01-18 DIAGNOSIS — J069 Acute upper respiratory infection, unspecified: Secondary | ICD-10-CM | POA: Diagnosis not present

## 2017-01-20 DIAGNOSIS — H2512 Age-related nuclear cataract, left eye: Secondary | ICD-10-CM | POA: Diagnosis not present

## 2017-01-20 DIAGNOSIS — H2513 Age-related nuclear cataract, bilateral: Secondary | ICD-10-CM | POA: Diagnosis not present

## 2017-01-21 NOTE — Patient Instructions (Signed)
Your procedure is scheduled on: 01/27/2017                Report to [facility] at    6;30 AM.  Call this number if you have problems the morning of surgery: [phone]   Remember:   Do not eat or drink :After Midnight.    Take these medicines the morning of surgery with A SIP OF WATER:  Amlodipine, Gabapentin, levothyroxine, losartan, Tramadol,  And use Albuterol inhaler         Do not wear jewelry, make-up or nail polish.  Do not wear lotions, powders, or perfumes. You may wear deodorant.  Do not bring valuables to the hospital.  Contacts, dentures or bridgework may not be worn into surgery.  Patients discharged the day of surgery will not be allowed to drive home.  Name and phone number of your driver:    @[email or web address] @ Cataract Surgery  A cataract is a clouding of the lens of the eye. When a lens becomes cloudy, vision is reduced based on the degree and nature of the clouding. Surgery may be needed to improve vision. Surgery removes the cloudy lens and usually replaces it with a substitute lens (intraocular lens, IOL). LET YOUR EYE DOCTOR KNOW ABOUT:  Allergies to food or medicine.   Medicines taken including herbs, eyedrops, over-the-counter medicines, and creams.   Use of steroids (by mouth or creams).   Previous problems with anesthetics or numbing medicine.   History of bleeding problems or blood clots.   Previous surgery.   Other health problems, including diabetes and kidney problems.   Possibility of pregnancy, if this applies.  RISKS AND COMPLICATIONS  Infection.   Inflammation of the eyeball (endophthalmitis) that can spread to both eyes (sympathetic ophthalmia).   Poor wound healing.   If an IOL is inserted, it can later fall out of proper position. This is very uncommon.   Clouding of the part of your eye that holds an IOL in place. This is called an "after-cataract." These are uncommon, but easily treated.  BEFORE THE PROCEDURE  Do not eat  or drink anything except small amounts of water for 8 to 12 before your surgery, or as directed by your caregiver.   Unless you are told otherwise, continue any eyedrops you have been prescribed.   Talk to your primary caregiver about all other medicines that you take (both prescription and non-prescription). In some cases, you may need to stop or change medicines near the time of your surgery. This is most important if you are taking blood-thinning medicine.Do not stop medicines unless you are told to do so.   Arrange for someone to drive you to and from the procedure.   Do not put contact lenses in either eye on the day of your surgery.  PROCEDURE There is more than one method for safely removing a cataract. Your doctor can explain the differences and help determine which is best for you. Phacoemulsification surgery is the most common form of cataract surgery.  An injection is given behind the eye or eyedrops are given to make this a painless procedure.   A small cut (incision) is made on the edge of the clear, dome-shaped surface that covers the front of the eye (cornea).   A tiny probe is painlessly inserted into the eye. This device gives off ultrasound waves that soften and break up the cloudy center of the lens. This makes it easier for the cloudy lens to  be removed by suction.   An IOL may be implanted.   The normal lens of the eye is covered by a clear capsule. Part of that capsule is intentionally left in the eye to support the IOL.   Your surgeon may or may not use stitches to close the incision.  There are other forms of cataract surgery that require a larger incision and stiches to close the eye. This approach is taken in cases where the doctor feels that the cataract cannot be easily removed using phacoemulsification. AFTER THE PROCEDURE  When an IOL is implanted, it does not need care. It becomes a permanent part of your eye and cannot be seen or felt.   Your doctor will  schedule follow-up exams to check on your progress.   Review your other medicines with your doctor to see which can be resumed after surgery.   Use eyedrops or take medicine as prescribed by your doctor.  Document Released: 11/07/2011 Document Reviewed: 11/04/2011 [facility] Patient Information 2012 [place].  .Cataract Surgery Care After Refer to this sheet in the next few weeks. These instructions provide you with information on caring for yourself after your procedure. Your caregiver may also give you more specific instructions. Your treatment has been planned according to current medical practices, but problems sometimes occur. Call your caregiver if you have any problems or questions after your procedure.  HOME CARE INSTRUCTIONS   Avoid strenuous activities as directed by your caregiver.   Ask your caregiver when you can resume driving.   Use eyedrops or other medicines to help healing and control pressure inside your eye as directed by your caregiver.   Only take over-the-counter or prescription medicines for pain, discomfort, or fever as directed by your caregiver.   Do not to touch or rub your eyes.   You may be instructed to use a protective shield during the first few days and nights after surgery. If not, wear sunglasses to protect your eyes. This is to protect the eye from pressure or from being accidentally bumped.   Keep the area around your eye clean and dry. Avoid swimming or allowing water to hit you directly in the face while showering. Keep soap and shampoo out of your eyes.   Do not bend or lift heavy objects. Bending increases pressure in the eye. You can walk, climb stairs, and do light household chores.   Do not put a contact lens into the eye that had surgery until your caregiver says it is okay to do so.   Ask your doctor when you can return to work. This will depend on the kind of work that you do. If you work in a dusty environment, you may be advised to  wear protective eyewear for a period of time.   Ask your caregiver when it will be safe to engage in sexual activity.   Continue with your regular eye exams as directed by your caregiver.  What to expect:  It is normal to feel itching and mild discomfort for a few days after cataract surgery. Some fluid discharge is also common, and your eye may be sensitive to light and touch.   After 1 to 2 days, even moderate discomfort should disappear. In most cases, healing will take about 6 weeks.   If you received an intraocular lens (IOL), you may notice that colors are very bright or have a blue tinge. Also, if you have been in bright sunlight, everything may appear reddish for a few  hours. If you see these color tinges, it is because your lens is clear and no longer cloudy. Within a few months after receiving an IOL, these extra colors should go away. When you have healed, you will probably need new glasses.  SEEK MEDICAL CARE IF:   You have increased bruising around your eye.   You have discomfort not helped by medicine.  SEEK IMMEDIATE MEDICAL CARE IF:   You have a fever.   You have a worsening or sudden vision loss.   You have redness, swelling, or increasing pain in the eye.   You have a thick discharge from the eye that had surgery.  MAKE SURE YOU:  Understand these instructions.   Will watch your condition.   Will get help right away if you are not doing well or get worse.  Document Released: 06/07/2005 Document Revised: 11/07/2011 Document Reviewed: 07/12/2011 [facility] Patient Information 2012 [place].    Monitored Anesthesia Care  Monitored anesthesia care is an anesthesia service for a medical procedure. Anesthesia is the loss of the ability to feel pain. It is produced by medications called anesthetics. It may affect a small area of your body (local anesthesia), a large area of your body (regional anesthesia), or your entire body (general anesthesia). The need for  monitored anesthesia care depends your procedure, your condition, and the potential need for regional or general anesthesia. It is often provided during procedures where:   General anesthesia may be needed if there are complications. This is because you need special care when you are under general anesthesia.   You will be under local or regional anesthesia. This is so that you are able to have higher levels of anesthesia if needed.   You will receive calming medications (sedatives). This is especially the case if sedatives are given to put you in a semi-conscious state of relaxation (deep sedation). This is because the amount of sedative needed to produce this state can be hard to predict. Too much of a sedative can produce general anesthesia. Monitored anesthesia care is performed by one or more caregivers who have special training in all types of anesthesia. You will need to meet with these caregivers before your procedure. During this meeting, they will ask you about your medical history. They will also give you instructions to follow. (For example, you will need to stop eating and drinking before your procedure. You may also need to stop or change medications you are taking.) During your procedure, your caregivers will stay with you. They will:   Watch your condition. This includes watching you blood pressure, breathing, and level of pain.   Diagnose and treat problems that occur.   Give medications if they are needed. These may include calming medications (sedatives) and anesthetics.   Make sure you are comfortable.  Having monitored anesthesia care does not necessarily mean that you will be under anesthesia. It does mean that your caregivers will be able to manage anesthesia if you need it or if it occurs. It also means that you will be able to have a different type of anesthesia than you are having if you need it. When your procedure is complete, your caregivers will continue to watch  your condition. They will make sure any medications wear off before you are allowed to go home.  Document Released: 08/14/2005 Document Revised: 03/15/2013 Document Reviewed: 12/30/2012 [facility] Patient Information 2014 [place], [place].

## 2017-01-22 ENCOUNTER — Encounter (HOSPITAL_COMMUNITY)
Admission: RE | Admit: 2017-01-22 | Discharge: 2017-01-22 | Disposition: A | Discharge status: Home / Self Care | Payer: Medicare Other | Source: Ambulatory Visit | Attending: Ophthalmology | Admitting: Ophthalmology

## 2017-01-22 ENCOUNTER — Other Ambulatory Visit: Payer: Self-pay

## 2017-01-22 ENCOUNTER — Encounter (HOSPITAL_COMMUNITY): Payer: Self-pay

## 2017-01-22 DIAGNOSIS — Z01818 Encounter for other preprocedural examination: Secondary | ICD-10-CM | POA: Insufficient documentation

## 2017-01-23 DIAGNOSIS — R450 Nervousness: Secondary | ICD-10-CM | POA: Diagnosis not present

## 2017-01-23 DIAGNOSIS — J441 Chronic obstructive pulmonary disease with (acute) exacerbation: Secondary | ICD-10-CM | POA: Diagnosis not present

## 2017-01-23 DIAGNOSIS — J060 Acute laryngopharyngitis: Secondary | ICD-10-CM | POA: Diagnosis not present

## 2017-01-23 DIAGNOSIS — R070 Pain in throat: Secondary | ICD-10-CM | POA: Diagnosis not present

## 2017-01-24 ENCOUNTER — Encounter: Payer: Self-pay | Admitting: Internal Medicine

## 2017-01-24 ENCOUNTER — Ambulatory Visit (INDEPENDENT_AMBULATORY_CARE_PROVIDER_SITE_OTHER)
Admission: RE | Admit: 2017-01-24 | Discharge: 2017-01-24 | Disposition: A | Discharge status: Home / Self Care | Payer: Medicare Other | Source: Ambulatory Visit | Attending: Internal Medicine | Admitting: Internal Medicine

## 2017-01-24 ENCOUNTER — Other Ambulatory Visit (INDEPENDENT_AMBULATORY_CARE_PROVIDER_SITE_OTHER): Payer: Medicare Other

## 2017-01-24 ENCOUNTER — Ambulatory Visit (INDEPENDENT_AMBULATORY_CARE_PROVIDER_SITE_OTHER): Payer: Medicare Other | Admitting: Internal Medicine

## 2017-01-24 VITALS — BP 112/78 | HR 90 | Ht 64.0 in | Wt 158.0 lb

## 2017-01-24 DIAGNOSIS — R0609 Other forms of dyspnea: Secondary | ICD-10-CM | POA: Diagnosis not present

## 2017-01-24 DIAGNOSIS — R0600 Dyspnea, unspecified: Secondary | ICD-10-CM

## 2017-01-24 DIAGNOSIS — R05 Cough: Secondary | ICD-10-CM | POA: Diagnosis not present

## 2017-01-24 DIAGNOSIS — J45991 Cough variant asthma: Secondary | ICD-10-CM | POA: Diagnosis not present

## 2017-01-24 LAB — BASIC METABOLIC PANEL
BUN: 20 mg/dL (ref 6–23)
CHLORIDE: 99 meq/L (ref 96–112)
CO2: 33 mEq/L — ABNORMAL HIGH (ref 19–32)
CREATININE: 0.92 mg/dL (ref 0.40–1.20)
Calcium: 9.6 mg/dL (ref 8.4–10.5)
GFR: 63.29 mL/min (ref >60.00)
Glucose, Bld: 96 mg/dL (ref 70–99)
POTASSIUM: 3.5 meq/L (ref 3.5–5.1)
Sodium: 137 mEq/L (ref 135–145)

## 2017-01-24 LAB — CBC WITH DIFFERENTIAL/PLATELET
Basophils Absolute: 0.1 10*3/uL (ref 0.0–0.1)
Basophils Relative: 0.9 % (ref 0.0–3.0)
EOS ABS: 0.3 10*3/uL (ref 0.0–0.7)
Eosinophils Relative: 2.4 % (ref 0.0–5.0)
HEMATOCRIT: 38 % (ref 36.0–46.0)
Hemoglobin: 12.7 g/dL (ref 12.0–15.0)
LYMPHS ABS: 3.2 10*3/uL (ref 0.7–4.0)
Lymphocytes Relative: 28.9 % (ref 12.0–46.0)
MCHC: 33.5 g/dL (ref 30.0–36.0)
MCV: 92.4 fl (ref 78.0–100.0)
MONO ABS: 0.9 10*3/uL (ref 0.1–1.0)
Monocytes Relative: 8.1 % (ref 3.0–12.0)
NEUTROS ABS: 6.6 10*3/uL (ref 1.4–7.7)
NEUTROS PCT: 59.7 % (ref 43.0–77.0)
PLATELETS: 221 10*3/uL (ref 150.0–400.0)
RBC: 4.11 Mil/uL (ref 3.87–5.11)
RDW: 14.5 % (ref 11.5–15.5)
WBC: 11 10*3/uL — ABNORMAL HIGH (ref 4.0–10.5)

## 2017-01-24 LAB — BRAIN NATRIURETIC PEPTIDE: PRO B NATRI PEPTIDE: 15 pg/mL (ref 0.0–100.0)

## 2017-01-24 MED ORDER — BUDESONIDE-FORMOTEROL FUMARATE 80-4.5 MCG/ACT IN AERO: 2.0000 | INHALATION_SPRAY | Freq: Two times a day (BID) | RESPIRATORY_TRACT | 11 refills | Status: DC

## 2017-01-24 MED ORDER — FAMOTIDINE 20 MG PO TABS: ORAL_TABLET | ORAL | 2 refills | Status: DC

## 2017-01-24 MED ORDER — PANTOPRAZOLE SODIUM 40 MG PO TBEC: 40.0000 mg | DELAYED_RELEASE_TABLET | Freq: Every day | ORAL | 2 refills | Status: DC

## 2017-01-24 NOTE — Assessment & Plan Note (Addendum)
Allergy profile 01/24/2017 >  Eos 0. /  IgE3   - 01/24/2017  After extensive coaching HFA effectiveness =    75% > continue symb 80 2bid  - Sinus CT 01/24/2017 >>>   She has no evidence of copd at all which means I can't explain her sob and her cough may also turn out to be unrelated to lung dz but for now will continue symbicort and see back in 2 weeks to regroup once w/u complete

## 2017-01-24 NOTE — Patient Instructions (Addendum)
Please remember to go to the lab  department downstairs in the basement  for your tests - we will call you with the results when they are available.  Plan A = Automatic = symbicort 80 Take 2 puffs first thing in am and then another 2 puffs about 12 hours later and stop spiriva   Work on inhaler technique:  relax and gently blow all the way out then take a nice smooth deep breath back in, triggering the inhaler at same time you start breathing in.  Hold for up to 5 seconds if you can. Blow out thru nose. Rinse and gargle with water when done     Plan B = Backup Only use your albuterol as a rescue medication to be used if you can't catch your breath by resting or doing a relaxed purse lip breathing pattern.  - The less you use it, the better it will work when you need it. - Ok to use the inhaler up to 2 puffs  every 4 hours if you must but call for appointment if use goes up over your usual need - Don't leave home without it !!  (think of it like the spare tire for your car)     Pantoprazole (protonix) 40 mg   Take  30-60 min before first meal of the day and Pepcid (famotidine)  20 mg one @  bedtime until return to office - this is the best way to tell whether stomach acid is contributing to your problem.    Please see patient coordinator before you leave today  to schedule sinus CT   GERD (REFLUX)  is an extremely common cause of respiratory symptoms just like yours , many times with no obvious heartburn at all.    It can be treated with medication, but also with lifestyle changes including elevation of the head of your bed (ideally with 6 inch  bed blocks),  Smoking cessation, avoidance of late meals, excessive alcohol, and avoid fatty foods, chocolate, peppermint, colas, red wine, and acidic juices such as orange juice.  NO MINT OR MENTHOL PRODUCTS SO NO COUGH DROPS   USE SUGARLESS CANDY INSTEAD (Jolley ranchers or Stover's or Life Savers) or even ice chips will also do - the key is to  swallow to prevent all throat clearing. NO OIL BASED VITAMINS - use powdered substitutes.   See Tammy NP w/in 2 weeks with all your medications, even over the counter meds, separated in two separate bags, the ones you take no matter what vs the ones you stop once you feel better and take only as needed when you feel you need them.   Tammy  will generate for you a new user friendly medication calendar that will put [place] all on the same page re: your medication use.     Without this process, it simply isn't possible to assure that we are providing  your outpatient care  with  the attention to detail we feel you deserve.   If we cannot assure that you're getting that kind of care,  then we cannot manage your problem effectively from this clinic.  Once you have seen Tammy and we are sure that we're all on the same page with your medication use she will arrange follow up with me.

## 2017-01-24 NOTE — Progress Notes (Signed)
Subjective:     Patient ID: [name], female   DOB: 1941/05/16,    MRN: [id number]  HPI  77 yowf  Quit 1999 in perfect health then around 2012 cough/sob dx copd [place] but spirometry was wnl  08/12/12 and did fine on symbicort then changed over to spirava respimat and good until Dec 2017 then bad cough/ wheeze  rx  UC pred x 2 and back on symbicort referred to pulmonary clinic 01/24/2017 by Dr   [place] Crane for re-eval ? Copd    01/24/2017 1st [facility] office visit/ [name]   Chief Complaint  Patient presents with  . Pulmonary Consult    Self referral for COPD.  She states that she was dxed in 2012. She states she was dxed with Bronchitis end of Dec 2017 and feels like she is just now getting over this. She c/o increased SOB over the past few wks and was started on Symbicort by PCP and this has helped some.   MMRC3 = can't walk 100 yards even at a slow pace at a flat grade s stopping due to sob  Can do HT but not [employer]  Sleeps fine/ cough starts up w/in min mucus light yellow / sorethroat esp in afternoons  No obvious day to day or daytime variability or assoc cp mucus plugs or hemoptysis or  chest tightness, subjective wheeze or overt sinus or hb symptoms. No unusual exp hx or h/o childhood pna/ asthma or knowledge of premature birth.  Sleeping ok without nocturnal  or early am exacerbation  of respiratory  c/o's or need for noct saba. Also denies any obvious fluctuation of symptoms with weather or environmental changes or other aggravating or alleviating factors except as outlined above   Current Medications, Allergies, Complete Past Medical History, Past Surgical History, Family History, and Social History were reviewed in [employer] record.  ROS  The following are not active complaints unless bolded sore throat, dysphagia, dental problems, itching, sneezing,  nasal congestion or excess/ purulent secretions, ear ache,   fever, chills, sweats, unintended wt  loss, classically pleuritic or exertional cp,  orthopnea pnd or leg swelling, presyncope, palpitations, abdominal pain, anorexia, nausea, vomiting, diarrhea  or change in bowel or bladder habits, change in stools or urine, dysuria,hematuria,  rash, arthralgias, visual complaints, headache, numbness, weakness or ataxia or problems with walking or coordination,  change in mood/affect or memory.            Review of Systems     Objective:   Physical Exam amb wf nad  Wt Readings from Last 3 Encounters:  01/24/17 158 lb (71.7 kg)  01/22/17 157 lb (71.2 kg)  09/25/16 165 lb (74.8 kg)    Vital signs reviewed - Note on arrival 02 sats  98% on RA     HEENT: nl dentition, turbinates bilaterally, and oropharynx. Nl external ear canals without cough reflex   NECK :  without JVD/Nodes/TM/ nl carotid upstrokes bilaterally   LUNGS: no acc muscle use,  Nl contour chest which is clear to A and P bilaterally without cough on insp or exp maneuvers   CV:  RRR  no s3 or murmur or increase in P2, and elastic hose / trace pitting bilaterally   ABD:  soft and nontender with nl inspiratory excursion in the supine position. No bruits or organomegaly appreciated, bowel sounds nl  MS:  Nl gait/ ext warm without deformities, calf tenderness, cyanosis or clubbing No obvious joint restrictions  SKIN: warm and dry without lesions    NEURO:  alert, approp, nl sensorium with  no motor or cerebellar deficits apparent.       I personally reviewed images and agree with radiology impression as follows:  CXR:   12/04/16 No active cardiopulmonary disease.  Labs ordered/ reviewed:      Chemistry      Component Value Date/Time   NA 137 01/24/2017 1232   K 3.5 01/24/2017 1232   CL 99 01/24/2017 1232   CO2 33 (H) 01/24/2017 1232   BUN 20 01/24/2017 1232   CREATININE 0.92 01/24/2017 1232      Component Value Date/Time   CALCIUM 9.6 01/24/2017 1232        Lab Results  Component Value Date    WBC 11.0 (H) 01/24/2017   HGB 12.7 01/24/2017   HCT 38.0 01/24/2017   MCV 92.4 01/24/2017   PLT 221.0 01/24/2017       Lab Results  Component Value Date   PROBNP 15.0 01/24/2017           Assessment:

## 2017-01-25 NOTE — Assessment & Plan Note (Signed)
Spirometry 08/12/2012 FEV1 2.23 (98%)  Ratio 79  ? On symbicort  - 01/24/2017  Walked RA x 3 laps @ 185 ft each stopped due to  End of study, nl pace, no   desat   Min sob  Symptoms are markedly disproportionate to objective findings and not clear this is a lung problem but pt does appear to have difficult airway management issues. DDX of  difficult airways management almost all start with A and  include Adherence, Ace Inhibitors, Acid Reflux, Active Sinus Disease, Alpha 1 Antitripsin deficiency, Anxiety masquerading as Airways dz,  ABPA,  Allergy(esp in young), Aspiration (esp in elderly), Adverse effects of meds,  Active smokers, A bunch of PE's (a small clot burden can't cause this syndrome unless there is already severe underlying pulm or vascular dz with poor reserve) plus two Bs  = Bronchiectasis and Beta blocker use..and one C= CHF  Adherence is always the initial "prime suspect" and is a multilayered concern that requires a "trust but verify" approach in every patient - starting with knowing how to use medications, especially inhalers, correctly, keeping up with refills and understanding the fundamental difference between maintenance and prns vs those medications only taken for a very short course and then stopped and not refilled.  - needs to return with all meds in hand using a trust but verify approach to confirm accurate Medication  Reconciliation The principal here is that until we are certain that the  patients are doing what we've asked, it makes no sense to ask them to do more.   ? Acid (or non-acid) GERD > always difficult to exclude as up to 75% of pts in some series report no assoc GI/ Heartburn symptoms> rec max (24h)  acid suppression and diet restrictions/ reviewed and instructions given in writing.    ? Adverse effects of dpi > leave off spiriva for now  ? Allergies / asthma > see cough variant asthma  ? chf > excluded with bnp so low   Total time devoted to counseling  > 50 %  of initial 60 min office visit:  review case with pt/ discussion of options/alternatives/ personally creating written customized instructions  in presence of pt  then going over those specific  Instructions directly with the pt including how to use all of the meds but in particular covering each new medication in detail and the difference between the maintenance= "automatic" meds and the prns using an action plan format for the latter (If this problem/symptom => do that organization reading Left to right).  Please see AVS from this visit for a full list of these instructions which I personally wrote for this pt and  are unique to this visit.

## 2017-01-27 ENCOUNTER — Encounter (HOSPITAL_COMMUNITY): Payer: Self-pay | Admitting: *Deleted

## 2017-01-27 ENCOUNTER — Ambulatory Visit (HOSPITAL_COMMUNITY): Payer: Medicare Other | Admitting: Anesthesiology

## 2017-01-27 ENCOUNTER — Encounter (HOSPITAL_COMMUNITY)
Admission: RE | Disposition: A | Discharge status: Home / Self Care | Payer: Self-pay | Source: Ambulatory Visit | Attending: Ophthalmology

## 2017-01-27 ENCOUNTER — Ambulatory Visit (HOSPITAL_COMMUNITY)
Admission: RE | Admit: 2017-01-27 | Discharge: 2017-01-27 | Disposition: A | Discharge status: Home / Self Care | Payer: Medicare Other | Source: Ambulatory Visit | Attending: Ophthalmology | Admitting: Ophthalmology

## 2017-01-27 DIAGNOSIS — I10 Essential (primary) hypertension: Secondary | ICD-10-CM | POA: Insufficient documentation

## 2017-01-27 DIAGNOSIS — Z87891 Personal history of nicotine dependence: Secondary | ICD-10-CM | POA: Diagnosis not present

## 2017-01-27 DIAGNOSIS — I739 Peripheral vascular disease, unspecified: Secondary | ICD-10-CM | POA: Diagnosis not present

## 2017-01-27 DIAGNOSIS — H269 Unspecified cataract: Secondary | ICD-10-CM | POA: Diagnosis not present

## 2017-01-27 DIAGNOSIS — J449 Chronic obstructive pulmonary disease, unspecified: Secondary | ICD-10-CM | POA: Insufficient documentation

## 2017-01-27 DIAGNOSIS — Z79899 Other long term (current) drug therapy: Secondary | ICD-10-CM | POA: Insufficient documentation

## 2017-01-27 DIAGNOSIS — K219 Gastro-esophageal reflux disease without esophagitis: Secondary | ICD-10-CM | POA: Insufficient documentation

## 2017-01-27 DIAGNOSIS — H2512 Age-related nuclear cataract, left eye: Secondary | ICD-10-CM | POA: Diagnosis not present

## 2017-01-27 HISTORY — PX: CATARACT EXTRACTION W/PHACO: SHX586

## 2017-01-27 HISTORY — DX: Gastro-esophageal reflux disease without esophagitis: K21.9

## 2017-01-27 LAB — RESPIRATORY ALLERGY PROFILE REGION II ~~LOC~~
ALLERGEN, COMM SILVER BIRCH, T3: 0.59 kU/L — AB
ALLERGEN, D PTERNOYSSINUS, D1: 0.1 kU/L — AB
ALLERGEN, MULBERRY, T70: 0.44 kU/L — AB
ALLERGEN, OAK, T7: 1.42 kU/L — AB
Allergen, C. Herbarum, M2: <0.1 kU/L
Allergen, Cedar tree, t12: 1.16 kU/L — ABNORMAL HIGH
Allergen, Cottonwood, t14: 0.76 kU/L — ABNORMAL HIGH
Allergen, Mouse Urine Protein, e78: <0.1 kU/L
Allergen, P. notatum, m1: <0.1 kU/L
Aspergillus fumigatus, m3: <0.1 kU/L
BOX ELDER: 1.33 kU/L — AB
Bermuda Grass: 0.77 kU/L — ABNORMAL HIGH
CAT DANDER: 0.32 kU/L — AB
COCKROACH: 0.55 kU/L — AB
Common Ragweed: 0.91 kU/L — ABNORMAL HIGH
D. FARINAE: 0.15 kU/L — AB
Dog Dander: 0.47 kU/L — ABNORMAL HIGH
Elm IgE: 1.55 kU/L — ABNORMAL HIGH
IGE (IMMUNOGLOBULIN E), SERUM: 702 kU/L — AB (ref <115)
JOHNSON GRASS: 0.73 kU/L — AB
Pecan/Hickory Tree IgE: 0.58 kU/L — ABNORMAL HIGH
ROUGH PIGWEED IGE: 0.8 kU/L — AB
SHEEP SORREL IGE: 0.79 kU/L — AB
Timothy Grass: 0.68 kU/L — ABNORMAL HIGH

## 2017-01-27 SURGERY — PHACOEMULSIFICATION, CATARACT, WITH IOL INSERTION
Anesthesia: Monitor Anesthesia Care | Site: Eye | Laterality: Left

## 2017-01-27 MED ORDER — LIDOCAINE HCL (PF) 1 % IJ SOLN
INTRAOCULAR | Status: DC | PRN
  Administered 2017-01-27: .8 mL

## 2017-01-27 MED ORDER — MIDAZOLAM HCL 2 MG/2ML IJ SOLN
INTRAMUSCULAR | Status: AC
  Filled 2017-01-27: qty 2

## 2017-01-27 MED ORDER — PROVISC 10 MG/ML IO SOLN
INTRAOCULAR | Status: DC | PRN
  Administered 2017-01-27: 0.85 mL via INTRAOCULAR

## 2017-01-27 MED ORDER — MIDAZOLAM HCL 2 MG/2ML IJ SOLN
INTRAMUSCULAR | Status: DC | PRN
  Administered 2017-01-27 (×2): 1 mg via INTRAVENOUS

## 2017-01-27 MED ORDER — CYCLOPENTOLATE-PHENYLEPHRINE 0.2-1 % OP SOLN
1.0000 [drp] | OPHTHALMIC | Status: AC
  Administered 2017-01-27 (×3): 1 [drp] via OPHTHALMIC

## 2017-01-27 MED ORDER — FENTANYL CITRATE (PF) 100 MCG/2ML IJ SOLN
25.0000 ug | Freq: Once | INTRAMUSCULAR | Status: AC
  Administered 2017-01-27: 25 ug via INTRAVENOUS

## 2017-01-27 MED ORDER — TETRACAINE HCL 0.5 % OP SOLN
1.0000 [drp] | OPHTHALMIC | Status: AC
  Administered 2017-01-27 (×3): 1 [drp] via OPHTHALMIC

## 2017-01-27 MED ORDER — NEOMYCIN-POLYMYXIN-DEXAMETH 3.5-10000-0.1 OP SUSP
OPHTHALMIC | Status: DC | PRN
  Administered 2017-01-27: 2 [drp] via OPHTHALMIC

## 2017-01-27 MED ORDER — BSS IO SOLN
INTRAOCULAR | Status: DC | PRN
  Administered 2017-01-27: 15 mL

## 2017-01-27 MED ORDER — POVIDONE-IODINE 5 % OP SOLN
OPHTHALMIC | Status: DC | PRN
  Administered 2017-01-27: 1 via OPHTHALMIC

## 2017-01-27 MED ORDER — FENTANYL CITRATE (PF) 100 MCG/2ML IJ SOLN
INTRAMUSCULAR | Status: AC
Start: 2017-01-27 — End: ?
  Filled 2017-01-27: qty 2

## 2017-01-27 MED ORDER — LACTATED RINGERS IV SOLN
INTRAVENOUS | Status: DC
  Administered 2017-01-27: 08:00:00 via INTRAVENOUS

## 2017-01-27 MED ORDER — LIDOCAINE HCL 3.5 % OP GEL
1.0000 "application " | Freq: Once | OPHTHALMIC | Status: AC
  Administered 2017-01-27: 1 via OPHTHALMIC

## 2017-01-27 MED ORDER — MIDAZOLAM HCL 2 MG/2ML IJ SOLN
1.0000 mg | INTRAMUSCULAR | Status: AC
  Administered 2017-01-27: 2 mg via INTRAVENOUS

## 2017-01-27 MED ORDER — PHENYLEPHRINE HCL 2.5 % OP SOLN
1.0000 [drp] | OPHTHALMIC | Status: AC
  Administered 2017-01-27 (×3): 1 [drp] via OPHTHALMIC

## 2017-01-27 MED ORDER — BSS IO SOLN
INTRAOCULAR | Status: DC | PRN
  Administered 2017-01-27: 500 mL

## 2017-01-27 SURGICAL SUPPLY — 12 items
CLOTH BEACON ORANGE TIMEOUT ST (SAFETY) ×1 IMPLANT
EYE SHIELD UNIVERSAL CLEAR (GAUZE/BANDAGES/DRESSINGS) ×1 IMPLANT
GLOVE BIOGEL PI IND STRL 6.5 (GLOVE) IMPLANT
GLOVE BIOGEL PI INDICATOR 6.5 (GLOVE) ×1
GLOVE EXAM NITRILE MD LF STRL (GLOVE) ×1 IMPLANT
PAD ARMBOARD 7.5X6 YLW CONV (MISCELLANEOUS) ×1 IMPLANT
RING MALYGIN (MISCELLANEOUS) ×1 IMPLANT
SIGHTPATH CAT PROC W REG LENS (Ophthalmic Related) ×2 IMPLANT
SYRINGE LUER LOK 1CC (MISCELLANEOUS) ×1 IMPLANT
TAPE SURG TRANSPORE 1 IN (GAUZE/BANDAGES/DRESSINGS) IMPLANT
TAPE SURGICAL TRANSPORE 1 IN (GAUZE/BANDAGES/DRESSINGS) ×1
WATER STERILE IRR 250ML POUR (IV SOLUTION) ×1 IMPLANT

## 2017-01-27 NOTE — Progress Notes (Signed)
LMTCB

## 2017-01-27 NOTE — Transfer of Care (Signed)
Immediate Anesthesia Transfer of Care Note  Patient: [name]  Procedure(s) Performed: Procedure(s) (LRB): CATARACT EXTRACTION PHACO AND INTRAOCULAR LENS PLACEMENT (IOC) (Left)  Patient Location: Shortstay  Anesthesia Type: MAC  Level of Consciousness: awake  Airway & Oxygen Therapy: Patient Spontanous Breathing   Post-op Assessment: Report given to PACU RN, Post -op Vital signs reviewed and stable and Patient moving all extremities  Post vital signs: Reviewed and stable  Complications: No apparent anesthesia complications

## 2017-01-27 NOTE — Op Note (Signed)
Date of Admission: 01/27/2017  Date of Surgery: 01/27/2017  Pre-Op Dx: Cataract  Left  Eye  Post-Op Dx: Senile Nuclear Cataract Left Eye,  Dx Code H25.12 ,   Surgeon: Tonny Branch, M.D.  Assistants: None  Anesthesia: Topical with MAC  Indications: Painless, progressive loss of vision with compromise of daily activities.  Surgery: Cataract Extraction with Intraocular lens Implant Left Eye, CPT Code [phone]  Discription: The patient had dilating drops and viscous lidocaine placed into the left eye in the pre-op holding area. After transfer to the operating room, a time out was performed. The patient was then prepped and draped. Beginning with a 28 degree blade a paracentesis port was made at the surgeon's 2 o'clock position. The anterior chamber was then filled with 1% non-preserved lidocaine with epinepherine. This was followed by filling the anterior chamber with Provisc. The pupil was dilated to approximately 4.60m.  A Malyugan ring was placed into the anterior chamber using its injector. The loops were positioned with the Kuglan hook. A bent cystatome needle was used to create a continuous tear capsulotomy. Hydrodissection was performed with balanced salt solution on a Fine canula. The lens nucleus was then removed using the phacoemulsification handpiece. Residual cortex was removed with the I&A handpiece. The anterior chamber and capsular bag were refilled with Provisc. A posterior chamber intraocular lens was placed into the capsular bag with it's injector. The implant was positioned with the Kuglan hook. The Malyugan ring was disengaged from the iris margin and removed with its injector. The Provisc was then removed from the anterior chamber and capsular bag with the I&A handpiece. Stromal hydration of the main incision and paracentesis port was performed with BSS on a Fine canula. The wounds were tested for leak which was negative. The patient tolerated the procedure well. There were no operative  complications. The patient was then transferred to the recovery room in stable condition.  Complications: None  Specimen: None  EBL: None  Prosthetic device: H[employer] [id number], power 21.5, SN [id number]

## 2017-01-27 NOTE — Progress Notes (Signed)
Already LMTCB

## 2017-01-27 NOTE — Anesthesia Postprocedure Evaluation (Signed)
Anesthesia Post Note  Patient: [name]  Procedure(s) Performed: Procedure(s) (LRB): CATARACT EXTRACTION PHACO AND INTRAOCULAR LENS PLACEMENT (IOC) (Left)  Patient location during evaluation: Short Stay Anesthesia Type: MAC Level of consciousness: awake and alert Pain management: pain level controlled Vital Signs Assessment: post-procedure vital signs reviewed and stable Respiratory status: spontaneous breathing Anesthetic complications: no     Last Vitals:  Vitals:   01/27/17 0810 01/27/17 0815  BP: 133/90 138/82  Pulse: 84 86  Resp: 20 18  Temp:      Last Pain:  Vitals:   01/27/17 0751  TempSrc: Oral                 Aniko Finnigan

## 2017-01-27 NOTE — Anesthesia Procedure Notes (Signed)
Procedure Name: MAC Date/Time: 01/27/2017 8:17 AM Performed by: Vista Deck Pre-anesthesia Checklist: Patient identified, Emergency Drugs available, Suction available, Timeout performed and Patient being monitored Patient Re-evaluated:Patient Re-evaluated prior to inductionOxygen Delivery Method: Nasal Cannula

## 2017-01-27 NOTE — Discharge Instructions (Signed)

## 2017-01-27 NOTE — Anesthesia Preprocedure Evaluation (Signed)
Anesthesia Evaluation  Patient identified by MRN, date of birth, ID band Patient awake    Reviewed: Allergy & Precautions, NPO status , Patient's Chart, lab work & pertinent test results  Airway Mallampati: I  TM Distance: >3 FB     Dental  (+) Teeth Intact   Pulmonary asthma , COPD, former smoker,    breath sounds clear to auscultation       Cardiovascular hypertension, Pt. on medications + Peripheral Vascular Disease   Rhythm:Regular Rate:Normal     Neuro/Psych    GI/Hepatic GERD  ,  Endo/Other    Renal/GU      Musculoskeletal   Abdominal   Peds  Hematology   Anesthesia Other Findings   Reproductive/Obstetrics                             Anesthesia Physical Anesthesia Plan  ASA: III  Anesthesia Plan: MAC   Post-op Pain Management:    Induction: Intravenous  Airway Management Planned: Nasal Cannula  Additional Equipment:   Intra-op Plan:   Post-operative Plan:   Informed Consent: I have reviewed the patients History and Physical, chart, labs and discussed the procedure including the risks, benefits and alternatives for the proposed anesthesia with the patient or authorized representative who has indicated his/her understanding and acceptance.     Plan Discussed with:   Anesthesia Plan Comments:         Anesthesia Quick Evaluation

## 2017-01-27 NOTE — H&P (Signed)
I have reviewed the H&P, the patient was re-examined, and I have identified no interval changes in medical condition and plan of care since the history and physical of record  

## 2017-01-28 NOTE — Progress Notes (Signed)
Spoke with pt and notified of results per Dr. Wert. Pt verbalized understanding and denied any questions. 

## 2017-01-29 ENCOUNTER — Encounter (HOSPITAL_COMMUNITY): Payer: Self-pay | Admitting: Ophthalmology

## 2017-02-10 ENCOUNTER — Encounter: Payer: Self-pay | Admitting: Adult Health

## 2017-02-10 ENCOUNTER — Ambulatory Visit (INDEPENDENT_AMBULATORY_CARE_PROVIDER_SITE_OTHER): Payer: Medicare Other | Admitting: Adult Health

## 2017-02-10 DIAGNOSIS — J45991 Cough variant asthma: Secondary | ICD-10-CM | POA: Diagnosis not present

## 2017-02-10 NOTE — Assessment & Plan Note (Signed)
Improved with current regimen  Continue on GERD diet   Plan  Patient Instructions  Stop Benadryl and Vitamin E .  Continue on Protonix and Pepcid .  GERD diet  Continue on Symbicort, rinse after use.  May use Delsym As needed for cough.  Follow up Dr. Melvyn Novas  In 2 months and As needed   Please contact office for sooner follow up if symptoms do not improve or worsen or seek emergency care

## 2017-02-10 NOTE — Progress Notes (Signed)
@Patient  ID: [name], female    DOB: 1941/04/17, 76 y.o.   MRN: [id number]  Chief Complaint  Patient presents with  . Follow-up    dyspnea     Referring provider: [name], MD  HPI: 76 year old female former smoker seen for pulmonary consult 01/24/2017 for self referral for  Cough and dyspnea   TEST  Allergy profile 01/24/2017 >  Eos 0. /  IgE3   - 01/24/2017  After extensive coaching HFA effectiveness =    75% > continue symb 80 2bid  - Sinus CT 01/24/2017 >>> Neg  Spirometry 08/12/2012 FEV1 2.23 (98%)  Ratio 79  ? On symbicort  - 01/24/2017  Walked RA x 3 laps @ 185 ft each stopped due to  End of study, nl pace, no   desat   Min sob  02/10/2017 Follow Up : Cough Variant Asthma  Present for a two-week follow-up. Patient was seen last visit for a flare of her cough and shortness of breath. Patient was felt to have some cough variant asthma. Last visit. Allergy profile showed IgE at 3. She was set up for sinus CT that was negative. Spirometry 2013 showed normal lung function.Marland Kitchen She was continued on Symbicort. She was treated for GERD related cough.  She returns today feeling much improved with decreased cough and shortness of breath.. Feels the GERD diet and meds have really helped.  We reviewed all her meds and organized them , updated MAR.    Allergies  Allergen Reactions  . Augmentin [Amoxicillin-Pot Clavulanate] Nausea Only    Has patient had a PCN reaction causing immediate rash, facial/tongue/throat swelling, SOB or lightheadedness with hypotension: No Has patient had a PCN reaction causing severe rash involving mucus membranes or skin necrosis: No Has patient had a PCN reaction that required hospitalization No Has patient had a PCN reaction occurring within the last 10 years: No If all of the above answers are "NO", then may proceed with Cephalosporin use.   . Ace Inhibitors Cough  . Entex La Itching  . Levofloxacin Itching    Immunization History    Administered Date(s) Administered  . Influenza Whole 09/01/2016  . Influenza-Unspecified 08/02/2013, 08/29/2015    Past Medical History:  Diagnosis Date  . Anemia   . Arthritis   . Constipation   . COPD (chronic obstructive pulmonary disease) ([employer])   . GERD (gastroesophageal reflux disease)   . High blood pressure   . Seasonal allergies   . Thyroid disease   . Varicose veins   . Vertigo 11/23/2013    Tobacco History: History  Smoking Status  . Former Smoker  . Packs/day: 0.25  . Years: 5.00  . Types: Cigarettes  . Quit date: 12/02/1997  Smokeless Tobacco  . Never Used   Counseling given: Not Answered   Outpatient Encounter Prescriptions as of 02/10/2017  Medication Sig  . acetaminophen (TYLENOL) 500 MG tablet Take 500 mg by mouth every 6 (six) hours as needed for mild pain.  Marland Kitchen albuterol (PROAIR HFA) 108 (90 BASE) MCG/ACT inhaler Inhale 2 puffs into the lungs every 6 (six) hours as needed for wheezing or shortness of breath.   . ALPRAZolam (XANAX) 0.5 MG tablet Take 0.5 mg by mouth at bedtime.   Marland Kitchen amLODipine (NORVASC) 2.5 MG tablet Take 2.5 mg by mouth every evening.   Marland Kitchen aspirin 81 MG tablet Take 81 mg by mouth daily.  . budesonide-formoterol (SYMBICORT) 80-4.5 MCG/ACT inhaler Inhale 2 puffs into the lungs 2 (two)  times daily.  Marland Kitchen dextromethorphan (DELSYM) 30 MG/5ML liquid 2 teaspoons by mouth every 12 hours as needed for cough  . docusate sodium (COLACE) 100 MG capsule Take 100 mg by mouth at bedtime.   . famotidine (PEPCID) 20 MG tablet One at bedtime  . fexofenadine (ALLEGRA) 180 MG tablet Take 180 mg by mouth daily.  . fluticasone (FLONASE) 50 MCG/ACT nasal spray Place 2 sprays into both nostrils daily as needed for allergies or rhinitis.  . furosemide (LASIX) 20 MG tablet Take 20 mg by mouth daily as needed.  . gabapentin (NEURONTIN) 100 MG capsule Take 1 capsule (100 mg total) by mouth every 8 (eight) hours as needed. (Patient taking differently: Take 100 mg by mouth  daily as needed (PAIN). )  . guaiFENesin (MUCINEX) 600 MG 12 hr tablet Take 600 mg by mouth 2 (two) times daily as needed for cough or to loosen phlegm.  . hydrochlorothiazide (HYDRODIURIL) 25 MG tablet Take 25 mg by mouth daily.   Marland Kitchen HYDROcodone-homatropine (HYCODAN) 5-1.5 MG/5ML syrup 1 teaspoon by mouth every 6 hours as needed for severe cough  . L-Lysine 1000 MG TABS Take 1 tablet by mouth daily.  Marland Kitchen levothyroxine (SYNTHROID, LEVOTHROID) 125 MCG tablet 1/2 tab by mouth once daily  . losartan (COZAAR) 50 MG tablet 1/2 tab by mouth once daily  . Magnesium 400 MG TABS Take 400 mg by mouth at bedtime.   . methocarbamol (ROBAXIN) 750 MG tablet 1-2 tablets by mouth three times daily as needed  . Multiple Vitamin (MULTIVITAMIN) tablet Take 1 tablet by mouth daily.  . NONFORMULARY OR COMPOUNDED ITEM Apply 1 application topically daily as needed (pain). Pt applies to her feet for Neuropathy.     Diclofenac 3%, Baclofen 2%, Gabapentin 5%, Lidocaine 5%, Menthol 1%   . pantoprazole (PROTONIX) 40 MG tablet Take 1 tablet (40 mg total) by mouth daily. Take 30-60 min before first meal of the day  . raloxifene (EVISTA) 60 MG tablet Take 30 mg by mouth every other day.   . traMADol (ULTRAM) 50 MG tablet Take 1 tablet by mouth daily as needed for moderate pain or severe pain.   . [DISCONTINUED] diphenhydrAMINE (BENADRYL) 25 mg capsule Take 25 mg by mouth at bedtime as needed for allergies.  . [DISCONTINUED] vitamin E 400 UNIT capsule Take 400 Units by mouth daily.   No facility-administered encounter medications on file as of 02/10/2017.      Review of Systems  Constitutional:   No  weight loss, night sweats,  Fevers, chills, +fatigue, or  lassitude.  HEENT:   No headaches,  Difficulty swallowing,  Tooth/dental problems, or  Sore throat,                No sneezing, itching, ear ache,+ nasal congestion, post nasal drip,   CV:  No chest pain,  Orthopnea, PND, swelling in lower extremities, anasarca,  dizziness, palpitations, syncope.   GI  No heartburn, indigestion, abdominal pain, nausea, vomiting, diarrhea, change in bowel habits, loss of appetite, bloody stools.   Resp:    No chest wall deformity  Skin: no rash or lesions.  GU: no dysuria, change in color of urine, no urgency or frequency.  No flank pain, no hematuria   MS:  No joint pain or swelling.  No decreased range of motion.  No back pain.    Physical Exam  BP 122/74 (BP Location: Left Arm, Cuff Size: Normal)   Pulse 84   Ht 5\' 4"  (1.626 m)  Wt 159 lb 9.6 oz (72.4 kg)   SpO2 100%   BMI 27.40 kg/m   GEN: A/Ox3; pleasant , NAD, elderly    HEENT:  [place]/AT,  EACs-clear, TMs-wnl, NOSE-clear, THROAT-clear, no lesions, no postnasal drip or exudate noted.   NECK:  Supple w/ fair ROM; no JVD; normal carotid impulses w/o bruits; no thyromegaly or nodules palpated; no lymphadenopathy.    RESP  Clear  P & A; w/o, wheezes/ rales/ or rhonchi. no accessory muscle use, no dullness to percussion  CARD:  RRR, no m/r/g, tr  peripheral edema, pulses intact, no cyanosis or clubbing.  GI:   Soft & nt; nml bowel sounds; no organomegaly or masses detected.   Musco: Warm bil, no deformities or joint swelling noted.   Neuro: alert, no focal deficits noted.    Skin: Warm, no lesions or rashes    Lab Results:   BNP No results found for: BNP  ProBNP    Component Value Date/Time   PROBNP 15.0 01/24/2017 1232    Imaging: Ct Maxillofacial Limited Wo Contrast  Result Date: 01/24/2017 CLINICAL DATA:  76 y/o  F; chronic cough, evaluate for sinusitis. EXAM: CT PARANASAL SINUS LIMITED WITHOUT CONTRAST TECHNIQUE: Non-contiguous multidetector CT images of the paranasal sinuses were obtained in a single plane without contrast. COMPARISON:  03/30/2010 sinus CT. FINDINGS: Streak artifact from dental hardware. Visualized orbits are unremarkable. No paranasal sinus mucosal thickening or fluid and visualized portions of drainage pathways  are patent. No acute osseous abnormality. IMPRESSION: No evidence of sinusitis. Electronically Signed   By: Kristine Garbe M.D.   On: 01/24/2017 16:26     Assessment & Plan:   Cough variant asthma  vs UACS Improved with current regimen  Continue on GERD diet   Plan  Patient Instructions  Stop Benadryl and Vitamin E .  Continue on Protonix and Pepcid .  GERD diet  Continue on Symbicort, rinse after use.  May use Delsym As needed for cough.  Follow up Dr. Melvyn Novas  In 2 months and As needed   Please contact office for sooner follow up if symptoms do not improve or worsen or seek emergency care          Rexene Edison, NP 02/10/2017

## 2017-02-10 NOTE — Patient Instructions (Addendum)
Stop Benadryl and Vitamin E .  Continue on Protonix and Pepcid .  GERD diet  Continue on Symbicort, rinse after use.  May use Delsym As needed for cough.  Follow up Dr. Melvyn Novas  In 2 months and As needed   Please contact office for sooner follow up if symptoms do not improve or worsen or seek emergency care

## 2017-02-10 NOTE — Progress Notes (Signed)
Chart and office note reviewed in detail  > agree with a/p as outlined    

## 2017-02-17 DIAGNOSIS — H2511 Age-related nuclear cataract, right eye: Secondary | ICD-10-CM | POA: Diagnosis not present

## 2017-02-20 ENCOUNTER — Encounter (HOSPITAL_COMMUNITY)
Admission: RE | Admit: 2017-02-20 | Discharge: 2017-02-20 | Disposition: A | Discharge status: Home / Self Care | Payer: Medicare Other | Source: Ambulatory Visit | Attending: Ophthalmology | Admitting: Ophthalmology

## 2017-02-20 ENCOUNTER — Encounter (HOSPITAL_COMMUNITY): Payer: Self-pay

## 2017-02-21 MED ORDER — LIDOCAINE HCL (PF) 1 % IJ SOLN
INTRAMUSCULAR | Status: AC
  Filled 2017-02-21: qty 2

## 2017-02-21 MED ORDER — LIDOCAINE HCL 3.5 % OP GEL
OPHTHALMIC | Status: AC
  Filled 2017-02-21: qty 1

## 2017-02-24 ENCOUNTER — Ambulatory Visit (HOSPITAL_COMMUNITY)
Admission: RE | Admit: 2017-02-24 | Discharge: 2017-02-24 | Disposition: A | Discharge status: Home / Self Care | Payer: Medicare Other | Source: Ambulatory Visit | Attending: Ophthalmology | Admitting: Ophthalmology

## 2017-02-24 ENCOUNTER — Encounter (HOSPITAL_COMMUNITY): Payer: Self-pay | Admitting: *Deleted

## 2017-02-24 ENCOUNTER — Encounter (HOSPITAL_COMMUNITY)
Admission: RE | Disposition: A | Discharge status: Home / Self Care | Payer: Self-pay | Source: Ambulatory Visit | Attending: Ophthalmology

## 2017-02-24 ENCOUNTER — Ambulatory Visit (HOSPITAL_COMMUNITY): Payer: Medicare Other | Admitting: Anesthesiology

## 2017-02-24 DIAGNOSIS — Z87891 Personal history of nicotine dependence: Secondary | ICD-10-CM | POA: Insufficient documentation

## 2017-02-24 DIAGNOSIS — Z79899 Other long term (current) drug therapy: Secondary | ICD-10-CM | POA: Insufficient documentation

## 2017-02-24 DIAGNOSIS — H2511 Age-related nuclear cataract, right eye: Secondary | ICD-10-CM | POA: Insufficient documentation

## 2017-02-24 DIAGNOSIS — I10 Essential (primary) hypertension: Secondary | ICD-10-CM | POA: Diagnosis not present

## 2017-02-24 DIAGNOSIS — J449 Chronic obstructive pulmonary disease, unspecified: Secondary | ICD-10-CM | POA: Insufficient documentation

## 2017-02-24 DIAGNOSIS — H2181 Floppy iris syndrome: Secondary | ICD-10-CM | POA: Insufficient documentation

## 2017-02-24 DIAGNOSIS — I739 Peripheral vascular disease, unspecified: Secondary | ICD-10-CM | POA: Diagnosis not present

## 2017-02-24 HISTORY — PX: CATARACT EXTRACTION W/PHACO: SHX586

## 2017-02-24 SURGERY — PHACOEMULSIFICATION, CATARACT, WITH IOL INSERTION
Anesthesia: Monitor Anesthesia Care | Site: Eye | Laterality: Right

## 2017-02-24 MED ORDER — LACTATED RINGERS IV SOLN
INTRAVENOUS | Status: DC
  Administered 2017-02-24: 07:00:00 via INTRAVENOUS

## 2017-02-24 MED ORDER — FENTANYL CITRATE (PF) 100 MCG/2ML IJ SOLN
25.0000 ug | Freq: Once | INTRAMUSCULAR | Status: AC
  Administered 2017-02-24: 25 ug via INTRAVENOUS

## 2017-02-24 MED ORDER — PROVISC 10 MG/ML IO SOLN
INTRAOCULAR | Status: DC | PRN
  Administered 2017-02-24: 0.85 mL via INTRAOCULAR

## 2017-02-24 MED ORDER — CYCLOPENTOLATE-PHENYLEPHRINE 0.2-1 % OP SOLN
1.0000 [drp] | OPHTHALMIC | Status: AC
  Administered 2017-02-24 (×3): 1 [drp] via OPHTHALMIC

## 2017-02-24 MED ORDER — NEOMYCIN-POLYMYXIN-DEXAMETH 3.5-10000-0.1 OP SUSP
OPHTHALMIC | Status: DC | PRN
  Administered 2017-02-24: 2 [drp] via OPHTHALMIC

## 2017-02-24 MED ORDER — PHENYLEPHRINE HCL 2.5 % OP SOLN
1.0000 [drp] | OPHTHALMIC | Status: AC
  Administered 2017-02-24 (×3): 1 [drp] via OPHTHALMIC

## 2017-02-24 MED ORDER — BSS IO SOLN
INTRAOCULAR | Status: DC | PRN
  Administered 2017-02-24: 15 mL

## 2017-02-24 MED ORDER — POVIDONE-IODINE 5 % OP SOLN
OPHTHALMIC | Status: DC | PRN
  Administered 2017-02-24: 1 via OPHTHALMIC

## 2017-02-24 MED ORDER — EPINEPHRINE PF 1 MG/ML IJ SOLN
INTRAMUSCULAR | Status: AC
  Filled 2017-02-24: qty 1

## 2017-02-24 MED ORDER — LIDOCAINE HCL (PF) 1 % IJ SOLN
INTRAOCULAR | Status: DC | PRN
  Administered 2017-02-24: .8 mL

## 2017-02-24 MED ORDER — MIDAZOLAM HCL 2 MG/2ML IJ SOLN
1.0000 mg | INTRAMUSCULAR | Status: AC
  Administered 2017-02-24 (×2): 2 mg via INTRAVENOUS
  Filled 2017-02-24: qty 2

## 2017-02-24 MED ORDER — BSS IO SOLN
INTRAOCULAR | Status: DC | PRN
  Administered 2017-02-24: 500 mL

## 2017-02-24 MED ORDER — MIDAZOLAM HCL 2 MG/2ML IJ SOLN
INTRAMUSCULAR | Status: AC
  Filled 2017-02-24: qty 2

## 2017-02-24 MED ORDER — LIDOCAINE HCL 3.5 % OP GEL
1.0000 "application " | Freq: Once | OPHTHALMIC | Status: AC
  Administered 2017-02-24: 1 via OPHTHALMIC

## 2017-02-24 MED ORDER — FENTANYL CITRATE (PF) 100 MCG/2ML IJ SOLN
INTRAMUSCULAR | Status: AC
  Filled 2017-02-24: qty 2

## 2017-02-24 MED ORDER — TETRACAINE HCL 0.5 % OP SOLN
1.0000 [drp] | OPHTHALMIC | Status: AC
  Administered 2017-02-24 (×3): 1 [drp] via OPHTHALMIC

## 2017-02-24 SURGICAL SUPPLY — 13 items
CLOTH BEACON ORANGE TIMEOUT ST (SAFETY) ×1 IMPLANT
EYE SHIELD UNIVERSAL CLEAR (GAUZE/BANDAGES/DRESSINGS) ×1 IMPLANT
GLOVE BIOGEL PI IND STRL 7.0 (GLOVE) IMPLANT
GLOVE BIOGEL PI INDICATOR 7.0 (GLOVE) ×1
GLOVE EXAM NITRILE MD LF STRL (GLOVE) ×1 IMPLANT
PAD ARMBOARD 7.5X6 YLW CONV (MISCELLANEOUS) ×1 IMPLANT
RING MALYGIN (MISCELLANEOUS) ×1 IMPLANT
SIGHTPATH CAT PROC W REG LENS (Ophthalmic Related) ×2 IMPLANT
SYRINGE LUER LOK 1CC (MISCELLANEOUS) ×1 IMPLANT
TAPE SURG TRANSPORE 1 IN (GAUZE/BANDAGES/DRESSINGS) IMPLANT
TAPE SURGICAL TRANSPORE 1 IN (GAUZE/BANDAGES/DRESSINGS) ×1
VISCOELASTIC ADDITIONAL (OPHTHALMIC RELATED) ×1 IMPLANT
WATER STERILE IRR 250ML POUR (IV SOLUTION) ×1 IMPLANT

## 2017-02-24 NOTE — Anesthesia Postprocedure Evaluation (Signed)
Anesthesia Post Note  Patient: [name]  Procedure(s) Performed: Procedure(s) (LRB): CATARACT EXTRACTION PHACO AND INTRAOCULAR LENS PLACEMENT (IOC) (Right)  Patient location during evaluation: Short Stay Anesthesia Type: MAC Level of consciousness: awake and alert and oriented Pain management: pain level controlled Vital Signs Assessment: post-procedure vital signs reviewed and stable Respiratory status: spontaneous breathing Cardiovascular status: stable Postop Assessment: no signs of nausea or vomiting Anesthetic complications: no     Last Vitals:  Vitals:   02/24/17 0700 02/24/17 0715  BP: 131/73 (!) 137/94  Resp: 19 (!) 24  Temp:      Last Pain:  Vitals:   02/24/17 0628  TempSrc: Oral                 [name]

## 2017-02-24 NOTE — Op Note (Signed)
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  Assistants: None  Anesthesia: Topical with MAC  Indications: Painless, progressive loss of vision with compromise of daily activities.  Surgery: Cataract Extraction with Intraocular lens Implant Right Eye, CPT Code [phone]  Discription: The patient had dilating drops and viscous lidocaine placed into the left eye in the pre-op holding area. After transfer to the operating room, a time out was performed. The patient was then prepped and draped. Beginning with a 24 degree blade a paracentesis port was made at the surgeon's 2 o'clock position. The anterior chamber was then filled with 1% non-preserved lidocaine with epinepherine. This was followed by filling the anterior chamber with Provisc. A Malyugan ring was placed into the anterior chamber using its injector. The loops were positioned with the Kuglan hook. A bent cystatome needle was used to create a continuous tear capsulotomy. Hydrodissection was performed with balanced salt solution on a Fine canula. The lens nucleus was then removed using the phacoemulsification handpiece. Residual cortex was removed with the I&A handpiece. The anterior chamber and capsular bag were refilled with Provisc. A posterior chamber intraocular lens was placed into the capsular bag with it's injector. The implant was positioned with the Kuglan hook. The Malyugan ring was disengaged from the iris margin and removed with its injector. The Provisc was then removed from the anterior chamber and capsular bag with the I&A handpiece. Stromal hydration of the main incision and paracentesis port was performed with BSS on a Fine canula. The wounds were tested for leak which was negative. The patient tolerated the procedure well. There  were no operative complications. The patient was then transferred to the recovery room in stable condition.  Complications: None  Specimen: None  EBL: None  Prosthetic device: Hoya Model 250, power 21.0, SN [id number].

## 2017-02-24 NOTE — Anesthesia Preprocedure Evaluation (Signed)
Anesthesia Evaluation  Patient identified by MRN, date of birth, ID band Patient awake    Reviewed: Allergy & Precautions, NPO status , Patient's Chart, lab work & pertinent test results  Airway Mallampati: I  TM Distance: >3 FB     Dental  (+) Teeth Intact   Pulmonary asthma , COPD, former smoker,    breath sounds clear to auscultation       Cardiovascular hypertension, Pt. on medications + Peripheral Vascular Disease   Rhythm:Regular Rate:Normal     Neuro/Psych    GI/Hepatic GERD  ,  Endo/Other    Renal/GU      Musculoskeletal   Abdominal   Peds  Hematology   Anesthesia Other Findings   Reproductive/Obstetrics                             Anesthesia Physical Anesthesia Plan  ASA: III  Anesthesia Plan: MAC   Post-op Pain Management:    Induction: Intravenous  Airway Management Planned: Nasal Cannula  Additional Equipment:   Intra-op Plan:   Post-operative Plan:   Informed Consent: I have reviewed the patients History and Physical, chart, labs and discussed the procedure including the risks, benefits and alternatives for the proposed anesthesia with the patient or authorized representative who has indicated his/her understanding and acceptance.     Plan Discussed with:   Anesthesia Plan Comments:         Anesthesia Quick Evaluation

## 2017-02-24 NOTE — Transfer of Care (Signed)
Immediate Anesthesia Transfer of Care Note  Patient: [name]  Procedure(s) Performed: Procedure(s) with comments: CATARACT EXTRACTION PHACO AND INTRAOCULAR LENS PLACEMENT (IOC) (Right) - CDE: 8.57  Patient Location: Short Stay  Anesthesia Type:MAC  Level of Consciousness: alert   Airway & Oxygen Therapy: Patient Spontanous Breathing  Post-op Assessment: Report given to RN and Post -op Vital signs reviewed and stable  Post vital signs: Reviewed and stable  Last Vitals:  Vitals:   02/24/17 0700 02/24/17 0715  BP: 131/73 (!) 137/94  Resp: 19 (!) 24  Temp:      Last Pain:  Vitals:   02/24/17 0628  TempSrc: Oral      Patients Stated Pain Goal: 7 (12/87/86 7672)  Complications: No apparent anesthesia complications

## 2017-02-24 NOTE — Anesthesia Procedure Notes (Signed)
Procedure Name: MAC Date/Time: 02/24/2017 7:26 AM Performed by: Andree Elk, Genella Bas A Pre-anesthesia Checklist: Patient identified, Timeout performed, Emergency Drugs available, Suction available and Patient being monitored Oxygen Delivery Method: Nasal cannula

## 2017-02-24 NOTE — Discharge Instructions (Signed)

## 2017-02-24 NOTE — H&P (Signed)
I have reviewed the H&P, the patient was re-examined, and I have identified no interval changes in medical condition and plan of care since the history and physical of record  

## 2017-02-26 ENCOUNTER — Encounter (HOSPITAL_COMMUNITY): Payer: Self-pay | Admitting: Ophthalmology

## 2017-03-19 DIAGNOSIS — M9902 Segmental and somatic dysfunction of thoracic region: Secondary | ICD-10-CM | POA: Diagnosis not present

## 2017-03-19 DIAGNOSIS — M9905 Segmental and somatic dysfunction of pelvic region: Secondary | ICD-10-CM | POA: Diagnosis not present

## 2017-03-19 DIAGNOSIS — M545 Low back pain: Secondary | ICD-10-CM | POA: Diagnosis not present

## 2017-03-19 DIAGNOSIS — M9903 Segmental and somatic dysfunction of lumbar region: Secondary | ICD-10-CM | POA: Diagnosis not present

## 2017-03-19 DIAGNOSIS — M546 Pain in thoracic spine: Secondary | ICD-10-CM | POA: Diagnosis not present

## 2017-03-21 DIAGNOSIS — M9903 Segmental and somatic dysfunction of lumbar region: Secondary | ICD-10-CM | POA: Diagnosis not present

## 2017-03-21 DIAGNOSIS — M545 Low back pain: Secondary | ICD-10-CM | POA: Diagnosis not present

## 2017-03-21 DIAGNOSIS — M9905 Segmental and somatic dysfunction of pelvic region: Secondary | ICD-10-CM | POA: Diagnosis not present

## 2017-03-21 DIAGNOSIS — M546 Pain in thoracic spine: Secondary | ICD-10-CM | POA: Diagnosis not present

## 2017-03-21 DIAGNOSIS — M9902 Segmental and somatic dysfunction of thoracic region: Secondary | ICD-10-CM | POA: Diagnosis not present

## 2017-03-21 DIAGNOSIS — I10 Essential (primary) hypertension: Secondary | ICD-10-CM | POA: Diagnosis not present

## 2017-03-24 DIAGNOSIS — H52223 Regular astigmatism, bilateral: Secondary | ICD-10-CM | POA: Diagnosis not present

## 2017-03-24 DIAGNOSIS — M9905 Segmental and somatic dysfunction of pelvic region: Secondary | ICD-10-CM | POA: Diagnosis not present

## 2017-03-24 DIAGNOSIS — M9903 Segmental and somatic dysfunction of lumbar region: Secondary | ICD-10-CM | POA: Diagnosis not present

## 2017-03-24 DIAGNOSIS — M546 Pain in thoracic spine: Secondary | ICD-10-CM | POA: Diagnosis not present

## 2017-03-24 DIAGNOSIS — M545 Low back pain: Secondary | ICD-10-CM | POA: Diagnosis not present

## 2017-03-24 DIAGNOSIS — M9902 Segmental and somatic dysfunction of thoracic region: Secondary | ICD-10-CM | POA: Diagnosis not present

## 2017-03-26 DIAGNOSIS — M9902 Segmental and somatic dysfunction of thoracic region: Secondary | ICD-10-CM | POA: Diagnosis not present

## 2017-03-26 DIAGNOSIS — M546 Pain in thoracic spine: Secondary | ICD-10-CM | POA: Diagnosis not present

## 2017-03-26 DIAGNOSIS — M9905 Segmental and somatic dysfunction of pelvic region: Secondary | ICD-10-CM | POA: Diagnosis not present

## 2017-03-26 DIAGNOSIS — M9903 Segmental and somatic dysfunction of lumbar region: Secondary | ICD-10-CM | POA: Diagnosis not present

## 2017-03-26 DIAGNOSIS — M545 Low back pain: Secondary | ICD-10-CM | POA: Diagnosis not present

## 2017-03-28 DIAGNOSIS — M9905 Segmental and somatic dysfunction of pelvic region: Secondary | ICD-10-CM | POA: Diagnosis not present

## 2017-03-28 DIAGNOSIS — M9902 Segmental and somatic dysfunction of thoracic region: Secondary | ICD-10-CM | POA: Diagnosis not present

## 2017-03-28 DIAGNOSIS — M9903 Segmental and somatic dysfunction of lumbar region: Secondary | ICD-10-CM | POA: Diagnosis not present

## 2017-03-28 DIAGNOSIS — M546 Pain in thoracic spine: Secondary | ICD-10-CM | POA: Diagnosis not present

## 2017-03-28 DIAGNOSIS — M545 Low back pain: Secondary | ICD-10-CM | POA: Diagnosis not present

## 2017-03-31 DIAGNOSIS — M9903 Segmental and somatic dysfunction of lumbar region: Secondary | ICD-10-CM | POA: Diagnosis not present

## 2017-03-31 DIAGNOSIS — M545 Low back pain: Secondary | ICD-10-CM | POA: Diagnosis not present

## 2017-03-31 DIAGNOSIS — M9902 Segmental and somatic dysfunction of thoracic region: Secondary | ICD-10-CM | POA: Diagnosis not present

## 2017-03-31 DIAGNOSIS — M546 Pain in thoracic spine: Secondary | ICD-10-CM | POA: Diagnosis not present

## 2017-03-31 DIAGNOSIS — M9905 Segmental and somatic dysfunction of pelvic region: Secondary | ICD-10-CM | POA: Diagnosis not present

## 2017-04-02 DIAGNOSIS — M9902 Segmental and somatic dysfunction of thoracic region: Secondary | ICD-10-CM | POA: Diagnosis not present

## 2017-04-02 DIAGNOSIS — M546 Pain in thoracic spine: Secondary | ICD-10-CM | POA: Diagnosis not present

## 2017-04-02 DIAGNOSIS — M9905 Segmental and somatic dysfunction of pelvic region: Secondary | ICD-10-CM | POA: Diagnosis not present

## 2017-04-02 DIAGNOSIS — M9903 Segmental and somatic dysfunction of lumbar region: Secondary | ICD-10-CM | POA: Diagnosis not present

## 2017-04-02 DIAGNOSIS — M545 Low back pain: Secondary | ICD-10-CM | POA: Diagnosis not present

## 2017-04-02 DIAGNOSIS — I10 Essential (primary) hypertension: Secondary | ICD-10-CM | POA: Diagnosis not present

## 2017-04-09 DIAGNOSIS — M546 Pain in thoracic spine: Secondary | ICD-10-CM | POA: Diagnosis not present

## 2017-04-09 DIAGNOSIS — M545 Low back pain: Secondary | ICD-10-CM | POA: Diagnosis not present

## 2017-04-09 DIAGNOSIS — M9902 Segmental and somatic dysfunction of thoracic region: Secondary | ICD-10-CM | POA: Diagnosis not present

## 2017-04-09 DIAGNOSIS — M9905 Segmental and somatic dysfunction of pelvic region: Secondary | ICD-10-CM | POA: Diagnosis not present

## 2017-04-09 DIAGNOSIS — M9903 Segmental and somatic dysfunction of lumbar region: Secondary | ICD-10-CM | POA: Diagnosis not present

## 2017-04-15 ENCOUNTER — Encounter: Payer: Self-pay | Admitting: Internal Medicine

## 2017-04-15 ENCOUNTER — Ambulatory Visit (INDEPENDENT_AMBULATORY_CARE_PROVIDER_SITE_OTHER): Payer: Medicare Other | Admitting: Internal Medicine

## 2017-04-15 VITALS — BP 90/60 | HR 75 | Ht 64.0 in | Wt 158.4 lb

## 2017-04-15 DIAGNOSIS — R0609 Other forms of dyspnea: Secondary | ICD-10-CM | POA: Diagnosis not present

## 2017-04-15 DIAGNOSIS — R0600 Dyspnea, unspecified: Secondary | ICD-10-CM

## 2017-04-15 DIAGNOSIS — J45991 Cough variant asthma: Secondary | ICD-10-CM

## 2017-04-15 MED ORDER — BUDESONIDE-FORMOTEROL FUMARATE 80-4.5 MCG/ACT IN AERO: 2.0000 | INHALATION_SPRAY | Freq: Two times a day (BID) | RESPIRATORY_TRACT | 0 refills | Status: DC

## 2017-04-15 NOTE — Patient Instructions (Signed)
Only change I would make in your medications is to try zyrtec 10 mg at bedtime in place of allegra as zyrtec works better sometimes on drainage  If you continue to have flares Dr [place] Crane may want to refer you to an allergist

## 2017-04-15 NOTE — Progress Notes (Signed)
Subjective:     Patient ID: [name], female   DOB: 11/23/1941,    MRN: [id number]    Brief patient profile:  30 yowf  Quit 1999 in perfect health then around 2012 cough/sob dx copd [place] but spirometry was wnl  08/12/12 and did fine on symbicort then changed over to spiriva respimat and good until Dec 2017 then bad cough/ wheeze  rx  UC pred x 2 and back on symbicort referred to pulmonary clinic 01/24/2017 by Dr   [place] Crane for re-eval ? Copd    History of Present Illness  01/24/2017 1st [facility] office visit/ [name]   Chief Complaint  Patient presents with  . Pulmonary Consult    Self referral for COPD.  She states that she was dxed in 2012. She states she was dxed with Bronchitis end of Dec 2017 and feels like she is just now getting over this. She c/o increased SOB over the past few wks and was started on Symbicort by PCP and this has helped some.   MMRC3 = can't walk 100 yards even at a slow pace at a flat grade s stopping due to sob  Can do HT but not [employer]  Sleeps fine/ cough starts up w/in min mucus light yellow / sorethroat esp in afternoons  Plan A = Automatic = symbicort 80 Take 2 puffs first thing in am and then another 2 puffs about 12 hours later and stop spiriva  Work on inhaler technique:   Plan B = Backup Only use your albuterol as a rescue medication t Pantoprazole (protonix) 40 mg   Take  30-60 min before first meal of the day and Pepcid (famotidine)  20 mg one @  bedtime until return to office - this is the best way to tell whether stomach acid is contributing to your problem.   Please see patient coordinator before you leave today  to schedule sinus CT > neg  GERD diet    02/10/18   NP  eval Stop Benadryl and Vitamin E .  Continue on Protonix and Pepcid .  GERD diet  Continue on Symbicort, rinse after use.  May use Delsym As needed for cough.      04/15/2017  f/u ov/[name] re: cough variant asthma/ chronic doe ? Etiology / Pos Atopy  Chief Complaint   Patient presents with  . Follow-up    Breathing is back to her normal baseline. She states still has occ dry cough.   doe still = MMRC3 = can't walk 100 yards even at a slow pace at a flat grade s stopping due to sob Does fine sleeping unless supine  with pnds interupting sleep on allegra s benefit but able to sleep on either side fine  No obvious day to day or daytime variability or assoc excess/ purulent sputum or mucus plugs or hemoptysis or cp or chest tightness, subjective wheeze or overt   hb symptoms. No unusual exp hx or h/o childhood pna/ asthma or knowledge of premature birth.  Sleeping ok as long as stays off back without nocturnal  or early am exacerbation  of respiratory  c/o's or need for noct saba. Also denies any obvious fluctuation of symptoms with weather or environmental changes or other aggravating or alleviating factors except as outlined above   Current Medications, Allergies, Complete Past Medical History, Past Surgical History, Family History, and Social History were reviewed in [employer] record.  ROS  The following are not active complaints unless  bolded sore throat, dysphagia, dental problems, itching, sneezing,  nasal congestion or excess/ purulent secretions, ear ache,   fever, chills, sweats, unintended wt loss, classically pleuritic or exertional cp,  orthopnea pnd or leg swelling, presyncope, palpitations, abdominal pain, anorexia, nausea, vomiting, diarrhea  or change in bowel or bladder habits, change in stools or urine, dysuria,hematuria,  rash, arthralgias, visual complaints, headache, numbness, weakness or ataxia or problems with walking or coordination,  change in mood/affect or memory.                          Objective:   Physical Exam amb wf nad  04/15/2017       158   01/24/17 158 lb (71.7 kg)  01/22/17 157 lb (71.2 kg)  09/25/16 165 lb (74.8 kg)    Vital signs reviewed - Note on arrival 02 sats  98% on RA      HEENT: nl dentition,  and oropharynx. Nl external ear canals without cough reflex - moderate bilateral non-specific turbinate edema     NECK :  without JVD/Nodes/TM/ nl carotid upstrokes bilaterally   LUNGS: no acc muscle use,  Nl contour chest which is clear to A and P bilaterally without cough on insp or exp maneuvers   CV:  RRR  no s3 or murmur or increase in P2, and elastic hose / trace pitting bilaterally   ABD:  soft and nontender with nl inspiratory excursion in the supine position. No bruits or organomegaly appreciated, bowel sounds nl  MS:  Nl gait/ ext warm without deformities, calf tenderness, cyanosis or clubbing No obvious joint restrictions   SKIN: warm and dry without lesions    NEURO:  alert, approp, nl sensorium with  no motor or cerebellar deficits apparent.            Assessment:     Outpatient Encounter Prescriptions as of 04/15/2017  Medication Sig  . acetaminophen (TYLENOL) 500 MG tablet Take 500 mg by mouth every 6 (six) hours as needed for mild pain.  Marland Kitchen albuterol (VENTOLIN HFA) 108 (90 Base) MCG/ACT inhaler Inhale 2 puffs into the lungs every 6 (six) hours as needed for wheezing or shortness of breath.  . ALPRAZolam (XANAX) 0.5 MG tablet Take 0.5 mg by mouth at bedtime.   Marland Kitchen amLODipine (NORVASC) 2.5 MG tablet Take 2.5 mg by mouth every evening.   Marland Kitchen aspirin EC 81 MG tablet Take 81 mg by mouth daily.  . budesonide-formoterol (SYMBICORT) 80-4.5 MCG/ACT inhaler Inhale 2 puffs into the lungs 2 (two) times daily.  Marland Kitchen docusate sodium (COLACE) 50 MG capsule Take 100 mg by mouth at bedtime.  . famotidine (PEPCID) 20 MG tablet One at bedtime (Patient taking differently: Take 20 mg by mouth at bedtime. )  . fexofenadine (ALLEGRA) 180 MG tablet Take 180 mg by mouth daily.  . fluticasone (FLONASE) 50 MCG/ACT nasal spray Place 2 sprays into both nostrils daily as needed for allergies or rhinitis.  . furosemide (LASIX) 20 MG tablet Take 20 mg by mouth daily as  needed (for swelling).   . gabapentin (NEURONTIN) 100 MG capsule Take 1 capsule (100 mg total) by mouth every 8 (eight) hours as needed. (Patient taking differently: Take 100 mg by mouth 3 (three) times daily as needed (for pain). )  . guaiFENesin (MUCINEX) 600 MG 12 hr tablet Take 600 mg by mouth 2 (two) times daily as needed (for congestion).   . hydrochlorothiazide (HYDRODIURIL) 25 MG tablet Take 25  mg by mouth daily.   Marland Kitchen L-Lysine 500 MG TABS Take 500 tablets by mouth daily.  Marland Kitchen levothyroxine (SYNTHROID, LEVOTHROID) 125 MCG tablet Take 67.5 mcg by mouth daily before breakfast.   . losartan (COZAAR) 50 MG tablet Take 25 mg by mouth daily.   . Magnesium 400 MG TABS Take 400 mg by mouth at bedtime.   . methocarbamol (ROBAXIN) 750 MG tablet Take 750-1,500 mg by mouth every 8 (eight) hours as needed for muscle spasms.   . Multiple Vitamin (MULTIVITAMIN) tablet Take 1 tablet by mouth daily.  . NONFORMULARY OR COMPOUNDED ITEM Apply 1 application topically daily as needed (pain). Pt applies to her feet for Neuropathy.     Diclofenac 3%, Baclofen 2%, Gabapentin 5%, Lidocaine 5%, Menthol 1%   . pantoprazole (PROTONIX) 40 MG tablet Take 1 tablet (40 mg total) by mouth daily. Take 30-60 min before first meal of the day  . raloxifene (EVISTA) 60 MG tablet Take 30 mg by mouth daily.   . traMADol (ULTRAM) 50 MG tablet Take 1 tablet by mouth daily as needed for moderate pain or severe pain.   . [DISCONTINUED] budesonide-formoterol (SYMBICORT) 80-4.5 MCG/ACT inhaler Inhale 2 puffs into the lungs 2 (two) times daily.  . [DISCONTINUED] albuterol (PROAIR HFA) 108 (90 BASE) MCG/ACT inhaler Inhale 2 puffs into the lungs every 6 (six) hours as needed for wheezing or shortness of breath.   . [DISCONTINUED] Besifloxacin HCl (BESIVANCE) 0.6 % SUSP Place 1 drop into the right eye 3 (three) times daily.  . [DISCONTINUED] Bromfenac Sodium (PROLENSA) 0.07 % SOLN Place 1 drop into the left eye daily.  . [DISCONTINUED]  dextromethorphan (DELSYM) 30 MG/5ML liquid Take 60 mg by mouth every 12 (twelve) hours as needed for cough.   . [DISCONTINUED] Difluprednate (DUREZOL) 0.05 % EMUL Place 1 drop into the left eye 3 (three) times daily.  . [DISCONTINUED] L-Lysine 1000 MG TABS Take 1,000 mg by mouth daily.    No facility-administered encounter medications on file as of 04/15/2017.

## 2017-04-16 DIAGNOSIS — M9905 Segmental and somatic dysfunction of pelvic region: Secondary | ICD-10-CM | POA: Diagnosis not present

## 2017-04-16 DIAGNOSIS — M9902 Segmental and somatic dysfunction of thoracic region: Secondary | ICD-10-CM | POA: Diagnosis not present

## 2017-04-16 DIAGNOSIS — M9903 Segmental and somatic dysfunction of lumbar region: Secondary | ICD-10-CM | POA: Diagnosis not present

## 2017-04-16 DIAGNOSIS — M546 Pain in thoracic spine: Secondary | ICD-10-CM | POA: Diagnosis not present

## 2017-04-16 DIAGNOSIS — M545 Low back pain: Secondary | ICD-10-CM | POA: Diagnosis not present

## 2017-04-16 NOTE — Assessment & Plan Note (Signed)
Spirometry 08/12/2012 FEV1 2.23 (98%)  Ratio 79  ? On symbicort  - 01/24/2017  Walked RA x 3 laps @ 185 ft each stopped due to  End of study, nl pace, no   desat   Min sob - Spirometry 04/15/2017  Completely nl including curvature  - 04/15/2017  Walked RA x 3 laps @ 185 ft each stopped due to  End of study, nl pace, min sob, no desat    I strongly suspect this is just deconditioning and certainly not copd or any cardiac limitation based on prev very low bnp so rec more regular sub max exercise and f/u here prn

## 2017-04-16 NOTE — Assessment & Plan Note (Addendum)
Allergy profile 01/24/2017 >  Eos 0.3 /  IgE  702  Dog > cat,Trees/grass/ ragweed    - 01/24/2017  continue symb 80 2bid  - Sinus CT 01/24/2017 >>> No evidence of sinusitis.   - Spirometry 04/15/2017  FEV1 2.11 (100%)  Ratio 80 with nl contour in effort indep portion  - 04/15/2017  After extensive coaching HFA effectiveness =    75%   Marked improvement with rx for asthma and gerd with only residual cc = pnds at hs   rec add zyrtec hs to see if helps and if not can use 1st gen antihistamine = zyrtec   Once she has remained symptom free for at least a month rec try off ppi and just use h2 bid for a month and then taper this off too - if flares off PPi then resume or refer to GI for longterm rx based on Discussed the recent press about ppi's in the context of a statistically significant (but questionably clinically relevant) increase in CRI in pts on ppi vs h2's > bottom line is the lowest dose of ppi that controls   gerd is the right dose and if that dose is zero that's fine esp since h2's are cheaper.   If cough flares on symbicort would refer allergy next   I had an extended discussion with the patient reviewing all relevant studies completed to date and  lasting 15 to 20 minutes of a 25 minute visit    Each maintenance medication was reviewed in detail including most importantly the difference between maintenance and prns and under what circumstances the prns are to be triggered using an action plan format that is not reflected in the computer generated alphabetically organized AVS.    Please see AVS for specific instructions unique to this visit that I personally wrote and verbalized to the the pt in detail and then reviewed with pt  by my nurse highlighting any  changes in therapy recommended at today's visit to their plan of care.

## 2017-04-17 ENCOUNTER — Other Ambulatory Visit: Payer: Self-pay | Admitting: Internal Medicine

## 2017-04-18 DIAGNOSIS — M545 Low back pain: Secondary | ICD-10-CM | POA: Diagnosis not present

## 2017-04-18 DIAGNOSIS — M9902 Segmental and somatic dysfunction of thoracic region: Secondary | ICD-10-CM | POA: Diagnosis not present

## 2017-04-18 DIAGNOSIS — M9903 Segmental and somatic dysfunction of lumbar region: Secondary | ICD-10-CM | POA: Diagnosis not present

## 2017-04-18 DIAGNOSIS — M546 Pain in thoracic spine: Secondary | ICD-10-CM | POA: Diagnosis not present

## 2017-04-18 DIAGNOSIS — M9905 Segmental and somatic dysfunction of pelvic region: Secondary | ICD-10-CM | POA: Diagnosis not present

## 2017-04-20 ENCOUNTER — Emergency Department (HOSPITAL_COMMUNITY): Payer: Medicare Other

## 2017-04-20 ENCOUNTER — Encounter (HOSPITAL_COMMUNITY): Payer: Self-pay | Admitting: Emergency Medicine

## 2017-04-20 ENCOUNTER — Emergency Department (HOSPITAL_COMMUNITY)
Admission: EM | Admit: 2017-04-20 | Discharge: 2017-04-20 | Disposition: A | Discharge status: Home / Self Care | Payer: Medicare Other | Attending: Emergency Medicine | Admitting: Emergency Medicine

## 2017-04-20 DIAGNOSIS — E876 Hypokalemia: Secondary | ICD-10-CM | POA: Diagnosis not present

## 2017-04-20 DIAGNOSIS — Z79899 Other long term (current) drug therapy: Secondary | ICD-10-CM | POA: Insufficient documentation

## 2017-04-20 DIAGNOSIS — R110 Nausea: Secondary | ICD-10-CM | POA: Diagnosis present

## 2017-04-20 DIAGNOSIS — Z87891 Personal history of nicotine dependence: Secondary | ICD-10-CM | POA: Diagnosis not present

## 2017-04-20 DIAGNOSIS — R51 Headache: Secondary | ICD-10-CM | POA: Diagnosis not present

## 2017-04-20 DIAGNOSIS — Z7982 Long term (current) use of aspirin: Secondary | ICD-10-CM | POA: Insufficient documentation

## 2017-04-20 DIAGNOSIS — J449 Chronic obstructive pulmonary disease, unspecified: Secondary | ICD-10-CM | POA: Diagnosis not present

## 2017-04-20 LAB — URINALYSIS, ROUTINE W REFLEX MICROSCOPIC
Bacteria, UA: NONE SEEN
Bilirubin Urine: NEGATIVE
GLUCOSE, UA: NEGATIVE mg/dL
Hgb urine dipstick: NEGATIVE
Ketones, ur: NEGATIVE mg/dL
Nitrite: NEGATIVE
PH: 7 (ref 5.0–8.0)
Protein, ur: NEGATIVE mg/dL
RBC / HPF: NONE SEEN RBC/hpf (ref 0–5)
SPECIFIC GRAVITY, URINE: 1.013 (ref 1.005–1.030)

## 2017-04-20 LAB — COMPREHENSIVE METABOLIC PANEL
ALBUMIN: 4.1 g/dL (ref 3.5–5.0)
ALT: 23 U/L (ref 14–54)
AST: 25 U/L (ref 15–41)
Alkaline Phosphatase: 58 U/L (ref 38–126)
Anion gap: 10 (ref 5–15)
BUN: 15 mg/dL (ref 6–20)
CHLORIDE: 101 mmol/L (ref 101–111)
CO2: 27 mmol/L (ref 22–32)
Calcium: 8.9 mg/dL (ref 8.9–10.3)
Creatinine, Ser: 0.94 mg/dL (ref 0.44–1.00)
GFR calc non Af Amer: 58 mL/min — ABNORMAL LOW (ref >60)
GLUCOSE: 119 mg/dL — AB (ref 65–99)
Potassium: 2.7 mmol/L — CL (ref 3.5–5.1)
SODIUM: 138 mmol/L (ref 135–145)
Total Bilirubin: 0.6 mg/dL (ref 0.3–1.2)
Total Protein: 7.3 g/dL (ref 6.5–8.1)

## 2017-04-20 LAB — CBC
HCT: 38.1 % (ref 36.0–46.0)
HEMOGLOBIN: 12.7 g/dL (ref 12.0–15.0)
MCH: 30.8 pg (ref 26.0–34.0)
MCHC: 33.3 g/dL (ref 30.0–36.0)
MCV: 92.5 fL (ref 78.0–100.0)
Platelets: 200 10*3/uL (ref 150–400)
RBC: 4.12 MIL/uL (ref 3.87–5.11)
RDW: 14.2 % (ref 11.5–15.5)
WBC: 11.5 10*3/uL — ABNORMAL HIGH (ref 4.0–10.5)

## 2017-04-20 LAB — LIPASE, BLOOD: LIPASE: 14 U/L (ref 11–51)

## 2017-04-20 LAB — TROPONIN I: Troponin I: <0.03 ng/mL (ref <0.03)

## 2017-04-20 MED ORDER — METOCLOPRAMIDE HCL 10 MG PO TABS: 15.0000 mg | ORAL_TABLET | Freq: Four times a day (QID) | ORAL | 0 refills | Status: DC | PRN

## 2017-04-20 MED ORDER — POTASSIUM CHLORIDE CRYS ER 20 MEQ PO TBCR: 40.0000 meq | EXTENDED_RELEASE_TABLET | Freq: Every day | ORAL | 0 refills | Status: DC

## 2017-04-20 MED ORDER — METOCLOPRAMIDE HCL 5 MG/ML IJ SOLN
5.0000 mg | Freq: Once | INTRAMUSCULAR | Status: AC
  Administered 2017-04-20: 5 mg via INTRAVENOUS
  Filled 2017-04-20: qty 2

## 2017-04-20 MED ORDER — METOCLOPRAMIDE HCL 10 MG PO TABS: 5.0000 mg | ORAL_TABLET | Freq: Four times a day (QID) | ORAL | 0 refills | Status: DC | PRN

## 2017-04-20 MED ORDER — POTASSIUM CHLORIDE CRYS ER 20 MEQ PO TBCR
40.0000 meq | EXTENDED_RELEASE_TABLET | Freq: Once | ORAL | Status: AC
  Administered 2017-04-20: 40 meq via ORAL
  Filled 2017-04-20: qty 2

## 2017-04-20 MED ORDER — ONDANSETRON 4 MG PO TBDP
4.0000 mg | ORAL_TABLET | Freq: Once | ORAL | Status: AC | PRN
  Administered 2017-04-20: 4 mg via ORAL
  Filled 2017-04-20: qty 1

## 2017-04-20 NOTE — ED Provider Notes (Addendum)
[facility] DEPT Provider Note   CSN: [id number] Arrival date & time: 04/20/17  1702     History   Chief Complaint Chief Complaint  Patient presents with  . Nausea    HPI [name] is a 76 y.o. female.Complains of nausea and dry heaves since eating lunch today at 11:15 AM. She denies abdominal pain denies chest pain denies headache denies shortness of breath denies fever. No other associated symptoms she feels improved since treatment with Zofran here prior to my exam. No other associated symptoms. Nothing made symptoms better or worse  HPI  Past Medical History:  Diagnosis Date  . Anemia   . Arthritis   . Constipation   . COPD (chronic obstructive pulmonary disease) ([place])   . GERD (gastroesophageal reflux disease)   . High blood pressure   . Seasonal allergies   . Thyroid disease   . Varicose veins   . Vertigo 11/23/2013  GERD  Patient Active Problem List   Diagnosis Date Noted  . Dyspnea on exertion 01/24/2017  . Cough variant asthma  vs UACS 01/24/2017  . High risk medication use 09/25/2016  . Varicose veins of lower extremities with other complications 34/74/2595  . Varicose veins 02/15/2014  . Vertigo 11/23/2013  . Pain in limb 10/04/2013  . Hip bursitis 02/11/2013  . Constipation 02/16/2009  . RECTAL BLEEDING 02/16/2009  . HIGH BLOOD PRESSURE 01/29/2008    Past Surgical History:  Procedure Laterality Date  . APPENDECTOMY    . CATARACT EXTRACTION W/PHACO Left 01/27/2017   Procedure: CATARACT EXTRACTION PHACO AND INTRAOCULAR LENS PLACEMENT (IOC);  Surgeon: Tonny Branch, MD;  Location: [facility] ORS;  Service: Ophthalmology;  Laterality: Left;  CDE: 29.90  . CATARACT EXTRACTION W/PHACO Right 02/24/2017   Procedure: CATARACT EXTRACTION PHACO AND INTRAOCULAR LENS PLACEMENT (IOC);  Surgeon: Tonny Branch, MD;  Location: [facility] ORS;  Service: Ophthalmology;  Laterality: Right;  CDE: 8.57  . CHOLECYSTECTOMY    . COLONOSCOPY N/A 10/01/2016   Procedure: COLONOSCOPY;   Surgeon: Daneil Dolin, MD;  Location: [facility] ENDO SUITE;  Service: Endoscopy;  Laterality: N/A;  815   . TUBAL LIGATION      OB History    Gravida Para Term Preterm AB Living   2 2       2    SAB TAB Ectopic Multiple Live Births           2       Home Medications    Prior to Admission medications   Medication Sig Start Date End Date Taking? Authorizing Provider  acetaminophen (TYLENOL) 500 MG tablet Take 500 mg by mouth every 6 (six) hours as needed for mild pain.    [provider]  albuterol (VENTOLIN HFA) 108 (90 Base) MCG/ACT inhaler Inhale 2 puffs into the lungs every 6 (six) hours as needed for wheezing or shortness of breath.    [provider]  ALPRAZolam Duanne Moron) 0.5 MG tablet Take 0.5 mg by mouth at bedtime.     [provider]  amLODipine (NORVASC) 2.5 MG tablet Take 2.5 mg by mouth every evening.  02/09/13   [provider]  aspirin EC 81 MG tablet Take 81 mg by mouth daily.    [provider]  budesonide-formoterol (SYMBICORT) 80-4.5 MCG/ACT inhaler Inhale 2 puffs into the lungs 2 (two) times daily. 04/15/17   Tanda Rockers, MD  docusate sodium (COLACE) 50 MG capsule Take 100 mg by mouth at bedtime.    [provider]  famotidine (  PEPCID) 20 MG tablet TAKE (1) TABLET DAILY AT BEDTIME. 04/17/17   Tanda Rockers, MD  fexofenadine (ALLEGRA) 180 MG tablet Take 180 mg by mouth daily.    [provider]  fluticasone (FLONASE) 50 MCG/ACT nasal spray Place 2 sprays into both nostrils daily as needed for allergies or rhinitis.    [provider]  furosemide (LASIX) 20 MG tablet Take 20 mg by mouth daily as needed (for swelling).     [provider]  gabapentin (NEURONTIN) 100 MG capsule Take 1 capsule (100 mg total) by mouth every 8 (eight) hours as needed. Patient taking differently: Take 100 mg by mouth 3 (three) times daily as needed (for pain).  12/05/15   Carole Civil, MD  guaiFENesin (MUCINEX)  600 MG 12 hr tablet Take 600 mg by mouth 2 (two) times daily as needed (for congestion).     [provider]  hydrochlorothiazide (HYDRODIURIL) 25 MG tablet Take 25 mg by mouth daily.  07/28/13   [provider]  L-Lysine 500 MG TABS Take 500 tablets by mouth daily.    [provider]  levothyroxine (SYNTHROID, LEVOTHROID) 125 MCG tablet Take 67.5 mcg by mouth daily before breakfast.  01/06/13   [provider]  losartan (COZAAR) 50 MG tablet Take 25 mg by mouth daily.     [provider]  Magnesium 400 MG TABS Take 400 mg by mouth at bedtime.     [provider]  methocarbamol (ROBAXIN) 750 MG tablet Take 750-1,500 mg by mouth every 8 (eight) hours as needed for muscle spasms.     [provider]  Multiple Vitamin (MULTIVITAMIN) tablet Take 1 tablet by mouth daily.    [provider]  NONFORMULARY OR COMPOUNDED ITEM Apply 1 application topically daily as needed (pain). Pt applies to her feet for Neuropathy.     Diclofenac 3%, Baclofen 2%, Gabapentin 5%, Lidocaine 5%, Menthol 1%     [provider]  pantoprazole (PROTONIX) 40 MG tablet TAKE 1 TABLET BY MOUTH ONCE DAILY. TAKE 30-60 MINUTES BEFORE FIRST MEAL OF THE DAY. 04/17/17   Tanda Rockers, MD  raloxifene (EVISTA) 60 MG tablet Take 30 mg by mouth daily.     [provider]  traMADol (ULTRAM) 50 MG tablet Take 1 tablet by mouth daily as needed for moderate pain or severe pain.  08/31/13   [provider]    Family History Family History  Problem Relation Age of Onset  . Hypertension Mother   . Varicose Veins Father   . Heart attack Father   . Heart disease Father   . Hypertension Brother   . COPD Brother     Social History Social History  Substance Use Topics  . Smoking status: Former Smoker    Packs/day: 0.25    Years: 5.00    Types: Cigarettes    Quit date: 12/02/1997  . Smokeless tobacco: Never Used  . Alcohol use No     Allergies    Augmentin [amoxicillin-pot clavulanate]; Ace inhibitors; Entex la; and Levofloxacin   Review of Systems Review of Systems  Constitutional: Negative.   HENT: Negative.   Respiratory: Negative.   Cardiovascular: Negative.   Gastrointestinal: Positive for nausea.  Musculoskeletal: Negative.   Skin: Negative.   Neurological: Negative.   Psychiatric/Behavioral: Negative.   All other systems reviewed and are negative.    Physical Exam Updated Vital Signs BP (!) 143/97 (BP Location: Left Arm)   Pulse 93   Temp  98.3 F (36.8 C) (Oral)   Resp 18   Ht 5\' 4"  (1.626 m)   Wt 158 lb (71.7 kg)   SpO2 97%   BMI 27.12 kg/m   Physical Exam  Constitutional: She appears well-developed and well-nourished.  HENT:  Head: Normocephalic and atraumatic.  Eyes: Conjunctivae are normal. Pupils are equal, round, and reactive to light.  Neck: Neck supple. No tracheal deviation present. No thyromegaly present.  Cardiovascular: Normal rate and regular rhythm.   No murmur heard. Pulmonary/Chest: Effort normal and breath sounds normal.  Abdominal: Soft. Bowel sounds are normal. She exhibits no distension. There is no tenderness.  Musculoskeletal: Normal range of motion. She exhibits no edema or tenderness.  Neurological: She is alert. Coordination normal.  Skin: Skin is warm and dry. No rash noted.  Psychiatric: She has a normal mood and affect.  Nursing note and vitals reviewed.    ED Treatments / Results  Labs (all labs ordered are listed, but only abnormal results are displayed) Labs Reviewed  CBC - Abnormal; Notable for the following:       Result Value   WBC 11.5 (*)    All other components within normal limits  LIPASE, BLOOD  COMPREHENSIVE METABOLIC PANEL  URINALYSIS, ROUTINE W REFLEX MICROSCOPIC  TROPONIN I    EKG  EKG Interpretation  Date/Time:  Sunday Apr 20 2017 19:25:12 EDT Ventricular Rate:  80 PR Interval:    QRS Duration: 100 QT Interval:  412 QTC  Calculation: 476 R Axis:   -36 Text Interpretation:  Sinus rhythm Left axis deviation Low voltage, precordial leads Consider anterior infarct No significant change since last tracing Confirmed by Winfred Leeds  MD, Roxann Vierra [phone]) on 04/20/2017 7:37:51 PM       Radiology No results found.  Procedures Procedures (including critical care time)  Medications Ordered in ED Medications  ondansetron (ZOFRAN-ODT) disintegrating tablet 4 mg (4 mg Oral Given 04/20/17 1726)   Results for orders placed or performed during the hospital encounter of 04/20/17  Lipase, blood  Result Value Ref Range   Lipase 14 11 - 51 U/L  Comprehensive metabolic panel  Result Value Ref Range   Sodium 138 135 - 145 mmol/L   Potassium 2.7 (LL) 3.5 - 5.1 mmol/L   Chloride 101 101 - 111 mmol/L   CO2 27 22 - 32 mmol/L   Glucose, Bld 119 (H) 65 - 99 mg/dL   BUN 15 6 - 20 mg/dL   Creatinine, Ser 0.94 0.44 - 1.00 mg/dL   Calcium 8.9 8.9 - 10.3 mg/dL   Total Protein 7.3 6.5 - 8.1 g/dL   Albumin 4.1 3.5 - 5.0 g/dL   AST 25 15 - 41 U/L   ALT 23 14 - 54 U/L   Alkaline Phosphatase 58 38 - 126 U/L   Total Bilirubin 0.6 0.3 - 1.2 mg/dL   GFR calc non Af Amer 58 (L) >60 mL/min   GFR calc Af Amer >60 >60 mL/min   Anion gap 10 5 - 15  CBC  Result Value Ref Range   WBC 11.5 (H) 4.0 - 10.5 K/uL   RBC 4.12 3.87 - 5.11 MIL/uL   Hemoglobin 12.7 12.0 - 15.0 g/dL   HCT 38.1 36.0 - 46.0 %   MCV 92.5 78.0 - 100.0 fL   MCH 30.8 26.0 - 34.0 pg   MCHC 33.3 30.0 - 36.0 g/dL   RDW 14.2 11.5 - 15.5 %   Platelets 200 150 - 400 K/uL  Urinalysis, Routine w reflex  microscopic  Result Value Ref Range   Color, Urine YELLOW YELLOW   APPearance CLEAR CLEAR   Specific Gravity, Urine 1.013 1.005 - 1.030   pH 7.0 5.0 - 8.0   Glucose, UA NEGATIVE NEGATIVE mg/dL   Hgb urine dipstick NEGATIVE NEGATIVE   Bilirubin Urine NEGATIVE NEGATIVE   Ketones, ur NEGATIVE NEGATIVE mg/dL   Protein, ur NEGATIVE NEGATIVE mg/dL   Nitrite NEGATIVE NEGATIVE    Leukocytes, UA TRACE (A) NEGATIVE   RBC / HPF NONE SEEN 0 - 5 RBC/hpf   WBC, UA 0-5 0 - 5 WBC/hpf   Bacteria, UA NONE SEEN NONE SEEN   Squamous Epithelial / LPF 0-5 (A) NONE SEEN  Troponin I  Result Value Ref Range   Troponin I <0.03 <0.03 ng/mL   Dg Abd Acute W/chest  Result Date: 04/20/2017 CLINICAL DATA:  Nausea beginning this morning. EXAM: DG ABDOMEN ACUTE W/ 1V CHEST COMPARISON:  Chest x-ray 12/04/2016 and lumbar spine 08/16/2015 FINDINGS: Lungs are adequately inflated without focal consolidation or effusion. Mild stable cardiomegaly. Mild degenerate change of the spine. Abdominopelvic images demonstrate a nonobstructive bowel gas pattern. There is no free peritoneal air. No mass or mass effect. Surgical clips over the right upper quadrant. Minimal degenerate change of the spine. Several pelvic phleboliths. IMPRESSION: Nonobstructive bowel gas pattern. No acute cardiopulmonary disease. Electronically Signed   By: Marin Olp M.D.   On: 04/20/2017 20:06    Initial Impression / Assessment and Plan / ED Course  I have reviewed the triage vital signs and the nursing notes.  Pertinent labs & imaging results that were available during my care of the patient were reviewed by me and considered in my medical decision making (see chart for details).    8:50 PM patient developed headache while here and her nausea is improved since treatment with Zofran however continues to complain of nausea. IV Reglan ordered. 9:25 PM headache is improved after treatment with IV Reglan and nausea is improved. She feels ready to go home. She is alert and laboratory she is able to drink. She received oral potassium supplementation while here. Plan prescription Reglan, K Dur Call Dr. Nevada Crane to arrange follow-up this week and recheck of serum potassium and reexamination as needed  Final Clinical Impressions(s) / ED Diagnoses  Diagnoses #1 nausea #2 hypokalemia #3 nonspecific headache Final diagnoses:  None     New Prescriptions New Prescriptions   No medications on file     Orlie Dakin, MD 04/20/17 2129 Addendum prescription for Reglan 15 mg every 6 hours as needed for nausea/headache was written by mistake. That prescription was destroyed. Instead prescription for Reglan 5 mg every 6 hours as needed for nausea/headache was written and given to the patient   Orlie Dakin, MD 04/20/17 2138

## 2017-04-20 NOTE — Discharge Instructions (Signed)
Call Dr. Juel Burrow office tomorrow to arrange to get your blood potassium rechecked this week. Take the medication prescribed as needed for nausea. Ask Dr. Nevada Crane to reevaluate you in the office if you nausea continues within the next 2 or 3 days. Return if your condition worsens or if concerned for any reason

## 2017-04-20 NOTE — ED Triage Notes (Signed)
Patient c/o nausea that started this morning. Denies any vomiting, abd pain, diarrhea, fevers, or urinary symptoms. Patient states dry heaves. Patient states it has been intermittent. Last normal BM this morning-no blood noted.

## 2017-04-20 NOTE — ED Notes (Signed)
Pt reports that she became n after 1000 today. She reports that she ate some buttermilk pie that she made with 3 eggs, but can think of nothing else that would have made her ill

## 2017-04-20 NOTE — ED Notes (Signed)
Pt informed of need for urine sample. Pt states she does not have to go at this time but will inform nursing staff when able to provide one. Pt given water to drink with her medicine from [facility], Therapist, sports. Pt sipping on water with no complaints of nausea or vomiting.

## 2017-04-20 NOTE — ED Notes (Signed)
Critical value K- 2.7 Dr Lenna Sciara informed

## 2017-04-20 NOTE — ED Notes (Signed)
From radiology 

## 2017-04-24 DIAGNOSIS — I10 Essential (primary) hypertension: Secondary | ICD-10-CM | POA: Diagnosis not present

## 2017-04-30 DIAGNOSIS — M546 Pain in thoracic spine: Secondary | ICD-10-CM | POA: Diagnosis not present

## 2017-04-30 DIAGNOSIS — M9905 Segmental and somatic dysfunction of pelvic region: Secondary | ICD-10-CM | POA: Diagnosis not present

## 2017-04-30 DIAGNOSIS — M9903 Segmental and somatic dysfunction of lumbar region: Secondary | ICD-10-CM | POA: Diagnosis not present

## 2017-04-30 DIAGNOSIS — M9902 Segmental and somatic dysfunction of thoracic region: Secondary | ICD-10-CM | POA: Diagnosis not present

## 2017-04-30 DIAGNOSIS — M545 Low back pain: Secondary | ICD-10-CM | POA: Diagnosis not present

## 2017-05-05 DIAGNOSIS — H9209 Otalgia, unspecified ear: Secondary | ICD-10-CM | POA: Diagnosis not present

## 2017-05-05 DIAGNOSIS — K219 Gastro-esophageal reflux disease without esophagitis: Secondary | ICD-10-CM | POA: Diagnosis not present

## 2017-05-05 DIAGNOSIS — R110 Nausea: Secondary | ICD-10-CM | POA: Diagnosis not present

## 2017-05-05 DIAGNOSIS — G4709 Other insomnia: Secondary | ICD-10-CM | POA: Diagnosis not present

## 2017-05-05 DIAGNOSIS — R070 Pain in throat: Secondary | ICD-10-CM | POA: Diagnosis not present

## 2017-05-05 DIAGNOSIS — J449 Chronic obstructive pulmonary disease, unspecified: Secondary | ICD-10-CM | POA: Diagnosis not present

## 2017-05-05 DIAGNOSIS — E039 Hypothyroidism, unspecified: Secondary | ICD-10-CM | POA: Diagnosis not present

## 2017-05-28 DIAGNOSIS — M546 Pain in thoracic spine: Secondary | ICD-10-CM | POA: Diagnosis not present

## 2017-05-28 DIAGNOSIS — M545 Low back pain: Secondary | ICD-10-CM | POA: Diagnosis not present

## 2017-05-28 DIAGNOSIS — M9902 Segmental and somatic dysfunction of thoracic region: Secondary | ICD-10-CM | POA: Diagnosis not present

## 2017-05-28 DIAGNOSIS — M9903 Segmental and somatic dysfunction of lumbar region: Secondary | ICD-10-CM | POA: Diagnosis not present

## 2017-05-28 DIAGNOSIS — M9905 Segmental and somatic dysfunction of pelvic region: Secondary | ICD-10-CM | POA: Diagnosis not present

## 2017-06-12 ENCOUNTER — Other Ambulatory Visit: Payer: Self-pay | Admitting: Internal Medicine

## 2017-06-12 MED ORDER — FAMOTIDINE 20 MG PO TABS: ORAL_TABLET | ORAL | 0 refills | Status: DC

## 2017-06-12 MED ORDER — PANTOPRAZOLE SODIUM 40 MG PO TBEC: DELAYED_RELEASE_TABLET | ORAL | 0 refills | Status: DC

## 2017-06-23 ENCOUNTER — Other Ambulatory Visit: Payer: Self-pay | Admitting: Adult Health

## 2017-07-03 DIAGNOSIS — M9902 Segmental and somatic dysfunction of thoracic region: Secondary | ICD-10-CM | POA: Diagnosis not present

## 2017-07-03 DIAGNOSIS — M546 Pain in thoracic spine: Secondary | ICD-10-CM | POA: Diagnosis not present

## 2017-07-03 DIAGNOSIS — M9905 Segmental and somatic dysfunction of pelvic region: Secondary | ICD-10-CM | POA: Diagnosis not present

## 2017-07-03 DIAGNOSIS — M9903 Segmental and somatic dysfunction of lumbar region: Secondary | ICD-10-CM | POA: Diagnosis not present

## 2017-07-03 DIAGNOSIS — M545 Low back pain: Secondary | ICD-10-CM | POA: Diagnosis not present

## 2017-07-09 DIAGNOSIS — E039 Hypothyroidism, unspecified: Secondary | ICD-10-CM | POA: Diagnosis not present

## 2017-07-09 DIAGNOSIS — I10 Essential (primary) hypertension: Secondary | ICD-10-CM | POA: Diagnosis not present

## 2017-07-09 DIAGNOSIS — E119 Type 2 diabetes mellitus without complications: Secondary | ICD-10-CM | POA: Diagnosis not present

## 2017-07-16 DIAGNOSIS — M5430 Sciatica, unspecified side: Secondary | ICD-10-CM | POA: Diagnosis not present

## 2017-07-16 DIAGNOSIS — E785 Hyperlipidemia, unspecified: Secondary | ICD-10-CM | POA: Diagnosis not present

## 2017-07-16 DIAGNOSIS — K219 Gastro-esophageal reflux disease without esophagitis: Secondary | ICD-10-CM | POA: Diagnosis not present

## 2017-07-16 DIAGNOSIS — E119 Type 2 diabetes mellitus without complications: Secondary | ICD-10-CM | POA: Diagnosis not present

## 2017-07-16 DIAGNOSIS — Z6826 Body mass index (BMI) 26.0-26.9, adult: Secondary | ICD-10-CM | POA: Diagnosis not present

## 2017-07-16 DIAGNOSIS — J45909 Unspecified asthma, uncomplicated: Secondary | ICD-10-CM | POA: Diagnosis not present

## 2017-07-16 DIAGNOSIS — E039 Hypothyroidism, unspecified: Secondary | ICD-10-CM | POA: Diagnosis not present

## 2017-07-16 DIAGNOSIS — I10 Essential (primary) hypertension: Secondary | ICD-10-CM | POA: Diagnosis not present

## 2017-07-24 ENCOUNTER — Other Ambulatory Visit: Payer: Self-pay | Admitting: Internal Medicine

## 2017-07-24 MED ORDER — BUDESONIDE-FORMOTEROL FUMARATE 80-4.5 MCG/ACT IN AERO: 2.0000 | INHALATION_SPRAY | Freq: Two times a day (BID) | RESPIRATORY_TRACT | 0 refills | Status: DC

## 2017-07-28 ENCOUNTER — Other Ambulatory Visit: Payer: Self-pay | Admitting: Internal Medicine

## 2017-07-29 DIAGNOSIS — Z6826 Body mass index (BMI) 26.0-26.9, adult: Secondary | ICD-10-CM | POA: Diagnosis not present

## 2017-07-29 DIAGNOSIS — M25522 Pain in left elbow: Secondary | ICD-10-CM | POA: Diagnosis not present

## 2017-08-08 ENCOUNTER — Other Ambulatory Visit: Payer: Self-pay | Admitting: *Deleted

## 2017-08-08 ENCOUNTER — Encounter: Payer: Self-pay | Admitting: Adult Health

## 2017-08-08 DIAGNOSIS — Z780 Asymptomatic menopausal state: Secondary | ICD-10-CM | POA: Diagnosis not present

## 2017-08-08 DIAGNOSIS — Z1231 Encounter for screening mammogram for malignant neoplasm of breast: Secondary | ICD-10-CM | POA: Diagnosis not present

## 2017-08-14 ENCOUNTER — Telehealth: Payer: Self-pay | Admitting: Adult Health

## 2017-08-14 NOTE — Telephone Encounter (Signed)
Left message that bone density was normal.

## 2017-08-28 DIAGNOSIS — M9905 Segmental and somatic dysfunction of pelvic region: Secondary | ICD-10-CM | POA: Diagnosis not present

## 2017-08-28 DIAGNOSIS — M9902 Segmental and somatic dysfunction of thoracic region: Secondary | ICD-10-CM | POA: Diagnosis not present

## 2017-08-28 DIAGNOSIS — M9903 Segmental and somatic dysfunction of lumbar region: Secondary | ICD-10-CM | POA: Diagnosis not present

## 2017-08-28 DIAGNOSIS — M545 Low back pain: Secondary | ICD-10-CM | POA: Diagnosis not present

## 2017-08-28 DIAGNOSIS — M546 Pain in thoracic spine: Secondary | ICD-10-CM | POA: Diagnosis not present

## 2017-09-01 DIAGNOSIS — Z23 Encounter for immunization: Secondary | ICD-10-CM | POA: Diagnosis not present

## 2017-09-26 DIAGNOSIS — Z6827 Body mass index (BMI) 27.0-27.9, adult: Secondary | ICD-10-CM | POA: Diagnosis not present

## 2017-09-26 DIAGNOSIS — M67431 Ganglion, right wrist: Secondary | ICD-10-CM | POA: Diagnosis not present

## 2017-10-10 DIAGNOSIS — M67431 Ganglion, right wrist: Secondary | ICD-10-CM | POA: Diagnosis not present

## 2017-10-20 DIAGNOSIS — M9902 Segmental and somatic dysfunction of thoracic region: Secondary | ICD-10-CM | POA: Diagnosis not present

## 2017-10-20 DIAGNOSIS — M9903 Segmental and somatic dysfunction of lumbar region: Secondary | ICD-10-CM | POA: Diagnosis not present

## 2017-10-20 DIAGNOSIS — M545 Low back pain: Secondary | ICD-10-CM | POA: Diagnosis not present

## 2017-10-20 DIAGNOSIS — M546 Pain in thoracic spine: Secondary | ICD-10-CM | POA: Diagnosis not present

## 2017-10-20 DIAGNOSIS — M9905 Segmental and somatic dysfunction of pelvic region: Secondary | ICD-10-CM | POA: Diagnosis not present

## 2017-10-21 DIAGNOSIS — M67431 Ganglion, right wrist: Secondary | ICD-10-CM | POA: Diagnosis not present

## 2017-10-21 DIAGNOSIS — M778 Other enthesopathies, not elsewhere classified: Secondary | ICD-10-CM | POA: Diagnosis not present

## 2017-10-22 ENCOUNTER — Other Ambulatory Visit: Payer: Self-pay | Admitting: Orthopedic Surgery

## 2017-10-22 DIAGNOSIS — M778 Other enthesopathies, not elsewhere classified: Secondary | ICD-10-CM

## 2017-10-22 DIAGNOSIS — M779 Enthesopathy, unspecified: Principal | ICD-10-CM

## 2017-10-28 ENCOUNTER — Encounter: Payer: Self-pay | Admitting: Orthopedic Surgery

## 2017-10-28 ENCOUNTER — Ambulatory Visit (INDEPENDENT_AMBULATORY_CARE_PROVIDER_SITE_OTHER): Payer: Medicare Other | Admitting: Orthopedic Surgery

## 2017-10-28 VITALS — BP 156/89 | HR 84 | Ht 64.0 in | Wt 164.0 lb

## 2017-10-28 DIAGNOSIS — G8929 Other chronic pain: Secondary | ICD-10-CM

## 2017-10-28 DIAGNOSIS — M545 Low back pain: Secondary | ICD-10-CM | POA: Diagnosis not present

## 2017-10-28 MED ORDER — METHYLPREDNISOLONE ACETATE 40 MG/ML IJ SUSP
40.0000 mg | Freq: Once | INTRAMUSCULAR | Status: AC
  Administered 2017-10-28: 40 mg via INTRAMUSCULAR

## 2017-10-28 NOTE — Progress Notes (Signed)
Progress Note   Patient ID: [name], female   DOB: 27-Feb-1941, 76 y.o.   MRN: [id number]  Chief Complaint  Patient presents with  . Hip Pain    left     76 yo female with hip pain   Actually its exacerbation of underlying back pain.  She has bilateral lower back pain without radicular symptoms  She has had an injection in her left hip for bursitis in the past with good results     Review of Systems  Musculoskeletal:       Right wrist pain seeing hand specialist   Current Meds  Medication Sig  . acetaminophen (TYLENOL) 500 MG tablet Take 500 mg by mouth every 6 (six) hours as needed for mild pain.  Marland Kitchen albuterol (VENTOLIN HFA) 108 (90 Base) MCG/ACT inhaler Inhale 2 puffs into the lungs every 6 (six) hours as needed for wheezing or shortness of breath.  . ALPRAZolam (XANAX) 0.5 MG tablet Take 0.5 mg by mouth at bedtime.   Marland Kitchen amLODipine (NORVASC) 2.5 MG tablet Take 2.5 mg by mouth every evening.   Marland Kitchen aspirin EC 81 MG tablet Take 81 mg by mouth daily.  . budesonide-formoterol (SYMBICORT) 80-4.5 MCG/ACT inhaler Inhale 2 puffs into the lungs 2 (two) times daily.  Marland Kitchen docusate sodium (COLACE) 50 MG capsule Take 100 mg by mouth at bedtime.  . famotidine (PEPCID) 20 MG tablet TAKE 1 TABLET EVERY DAY AT BEDTIME  . fexofenadine (ALLEGRA) 180 MG tablet Take 180 mg by mouth daily.  . fluticasone (FLONASE) 50 MCG/ACT nasal spray Place 2 sprays into both nostrils daily as needed for allergies or rhinitis.  . furosemide (LASIX) 20 MG tablet Take 20 mg by mouth daily as needed (for swelling).   . gabapentin (NEURONTIN) 100 MG capsule Take 1 capsule (100 mg total) by mouth every 8 (eight) hours as needed. (Patient taking differently: Take 100 mg by mouth 3 (three) times daily as needed (for pain). )  . guaiFENesin (MUCINEX) 600 MG 12 hr tablet Take 600 mg by mouth 2 (two) times daily as needed (for congestion).   . hydrochlorothiazide (HYDRODIURIL) 25 MG tablet Take 25 mg by mouth daily.   Marland Kitchen  L-Lysine 500 MG TABS Take 500 tablets by mouth daily.  Marland Kitchen levothyroxine (SYNTHROID, LEVOTHROID) 125 MCG tablet Take 67.5 mcg by mouth daily before breakfast.   . losartan (COZAAR) 50 MG tablet Take 25 mg by mouth daily.   . Magnesium 400 MG TABS Take 400 mg by mouth at bedtime.   . meloxicam (MOBIC) 7.5 MG tablet   . methocarbamol (ROBAXIN) 750 MG tablet Take 750-1,500 mg by mouth every 8 (eight) hours as needed for muscle spasms.   . metoCLOPramide (REGLAN) 10 MG tablet Take 1.5 tablets (15 mg total) by mouth every 6 (six) hours as needed for nausea (nausea/headache).  . metoCLOPramide (REGLAN) 10 MG tablet Take 0.5 tablets (5 mg total) by mouth every 6 (six) hours as needed for nausea (nausea/headache).  . Multiple Vitamin (MULTIVITAMIN) tablet Take 1 tablet by mouth daily.  . NONFORMULARY OR COMPOUNDED ITEM Apply 1 application topically daily as needed (pain). Pt applies to her feet for Neuropathy.     Diclofenac 3%, Baclofen 2%, Gabapentin 5%, Lidocaine 5%, Menthol 1%   . pantoprazole (PROTONIX) 40 MG tablet TAKE 1 TABLET BY MOUTH ONCE DAILY 30 TO 60 MINUTES BEFORE FIRST MEAL OF THE DAY.  Marland Kitchen potassium chloride SA (K-DUR,KLOR-CON) 20 MEQ tablet Take 2 tablets (40 mEq total) by mouth  daily.  . raloxifene (EVISTA) 60 MG tablet TAKE 1 TABLET BY MOUTH EVERY MORNING.  . traMADol (ULTRAM) 50 MG tablet Take 1 tablet by mouth daily as needed for moderate pain or severe pain.     Allergies  Allergen Reactions  . Augmentin [Amoxicillin-Pot Clavulanate] Nausea And Vomiting and Other (See Comments)    Has patient had a PCN reaction causing immediate rash, facial/tongue/throat swelling, SOB or lightheadedness with hypotension: No Has patient had a PCN reaction causing severe rash involving mucus membranes or skin necrosis: No Has patient had a PCN reaction that required hospitalization: No Has patient had a PCN reaction occurring within the last 10 years: No If all of the above answers are "NO", then may  proceed with Cephalosporin use.  . Ace Inhibitors Cough  . Entex La Itching  . Levofloxacin Hives and Itching     BP (!) 156/89   Pulse 84   Ht 5\' 4"  (1.626 m)   Wt 164 lb (74.4 kg)   BMI 28.15 kg/m   Physical Exam  Constitutional: She is oriented to person, place, and time. She appears well-developed and well-nourished.  Musculoskeletal:       Right hip: She exhibits normal range of motion, normal strength, no tenderness, no swelling, no crepitus and no deformity.       Left hip: Normal. She exhibits normal range of motion, normal strength, no tenderness, no bony tenderness, no swelling, no crepitus and no deformity.       Lumbar back: She exhibits decreased range of motion, tenderness, bony tenderness and pain. She exhibits no swelling, no edema, no deformity and no spasm.  Neurological: She is alert and oriented to person, place, and time.  Psychiatric: She has a normal mood and affect. Judgment normal.  Vitals reviewed.    Medical decision-making Encounter Diagnosis  Name Primary?  . Chronic bilateral low back pain without sciatica Yes   Intramuscular injection  Medication use 40 mg of Depo-Medrol Medication used lidocaine 1% 3 mL  Nurse injection  The left gluteus  was prepped with alcohol and ethyl chloride. The injection was given without consequence or complication   No orders of the defined types were placed in this encounter.    Arther Abbott, MD 10/28/2017 10:31 AM

## 2017-10-28 NOTE — Addendum Note (Signed)
Addended byCandice Camp on: 10/28/2017 10:52 AM   Modules accepted: Orders

## 2017-10-29 ENCOUNTER — Ambulatory Visit
Admission: RE | Admit: 2017-10-29 | Discharge: 2017-10-29 | Disposition: A | Discharge status: Home / Self Care | Payer: Medicare Other | Source: Ambulatory Visit | Attending: Orthopedic Surgery | Admitting: Orthopedic Surgery

## 2017-10-29 DIAGNOSIS — M779 Enthesopathy, unspecified: Principal | ICD-10-CM

## 2017-10-29 DIAGNOSIS — S56211A Strain of other flexor muscle, fascia and tendon at forearm level, right arm, initial encounter: Secondary | ICD-10-CM | POA: Diagnosis not present

## 2017-10-29 DIAGNOSIS — M778 Other enthesopathies, not elsewhere classified: Secondary | ICD-10-CM

## 2017-10-31 ENCOUNTER — Other Ambulatory Visit: Payer: Self-pay | Admitting: Orthopedic Surgery

## 2017-10-31 DIAGNOSIS — M778 Other enthesopathies, not elsewhere classified: Secondary | ICD-10-CM | POA: Diagnosis not present

## 2017-10-31 DIAGNOSIS — M19031 Primary osteoarthritis, right wrist: Secondary | ICD-10-CM | POA: Diagnosis not present

## 2017-11-03 ENCOUNTER — Other Ambulatory Visit: Payer: Self-pay

## 2017-11-03 ENCOUNTER — Encounter (HOSPITAL_BASED_OUTPATIENT_CLINIC_OR_DEPARTMENT_OTHER): Payer: Self-pay | Admitting: *Deleted

## 2017-11-03 ENCOUNTER — Other Ambulatory Visit: Payer: Self-pay | Admitting: Orthopedic Surgery

## 2017-11-05 ENCOUNTER — Encounter (HOSPITAL_COMMUNITY)
Admission: RE | Admit: 2017-11-05 | Discharge: 2017-11-05 | Disposition: A | Discharge status: Home / Self Care | Payer: Medicare Other | Source: Ambulatory Visit | Attending: Orthopedic Surgery | Admitting: Orthopedic Surgery

## 2017-11-05 DIAGNOSIS — M779 Enthesopathy, unspecified: Secondary | ICD-10-CM | POA: Diagnosis not present

## 2017-11-05 DIAGNOSIS — F419 Anxiety disorder, unspecified: Secondary | ICD-10-CM | POA: Diagnosis not present

## 2017-11-05 DIAGNOSIS — K219 Gastro-esophageal reflux disease without esophagitis: Secondary | ICD-10-CM | POA: Diagnosis not present

## 2017-11-05 DIAGNOSIS — E039 Hypothyroidism, unspecified: Secondary | ICD-10-CM | POA: Diagnosis not present

## 2017-11-05 DIAGNOSIS — Z79899 Other long term (current) drug therapy: Secondary | ICD-10-CM | POA: Diagnosis not present

## 2017-11-05 DIAGNOSIS — Z880 Allergy status to penicillin: Secondary | ICD-10-CM | POA: Diagnosis not present

## 2017-11-05 DIAGNOSIS — M19031 Primary osteoarthritis, right wrist: Secondary | ICD-10-CM | POA: Diagnosis not present

## 2017-11-05 DIAGNOSIS — Z87891 Personal history of nicotine dependence: Secondary | ICD-10-CM | POA: Diagnosis not present

## 2017-11-05 DIAGNOSIS — M1A0311 Idiopathic chronic gout, right wrist, with tophus (tophi): Secondary | ICD-10-CM | POA: Diagnosis not present

## 2017-11-05 DIAGNOSIS — Z7982 Long term (current) use of aspirin: Secondary | ICD-10-CM | POA: Diagnosis not present

## 2017-11-05 DIAGNOSIS — J45909 Unspecified asthma, uncomplicated: Secondary | ICD-10-CM | POA: Diagnosis not present

## 2017-11-05 LAB — BASIC METABOLIC PANEL
Anion gap: 6 (ref 5–15)
BUN: 16 mg/dL (ref 6–20)
CALCIUM: 8.7 mg/dL — AB (ref 8.9–10.3)
CO2: 27 mmol/L (ref 22–32)
CREATININE: 0.83 mg/dL (ref 0.44–1.00)
Chloride: 105 mmol/L (ref 101–111)
GFR calc Af Amer: >60 mL/min (ref >60)
GFR calc non Af Amer: >60 mL/min (ref >60)
GLUCOSE: 87 mg/dL (ref 65–99)
Potassium: 4.1 mmol/L (ref 3.5–5.1)
Sodium: 138 mmol/L (ref 135–145)

## 2017-11-06 ENCOUNTER — Encounter (HOSPITAL_BASED_OUTPATIENT_CLINIC_OR_DEPARTMENT_OTHER)
Admission: RE | Disposition: A | Discharge status: Home / Self Care | Payer: Self-pay | Source: Ambulatory Visit | Attending: Orthopedic Surgery

## 2017-11-06 ENCOUNTER — Ambulatory Visit (HOSPITAL_BASED_OUTPATIENT_CLINIC_OR_DEPARTMENT_OTHER)
Admission: RE | Admit: 2017-11-06 | Discharge: 2017-11-06 | Disposition: A | Discharge status: Home / Self Care | Payer: Medicare Other | Source: Ambulatory Visit | Attending: Orthopedic Surgery | Admitting: Orthopedic Surgery

## 2017-11-06 ENCOUNTER — Ambulatory Visit (HOSPITAL_BASED_OUTPATIENT_CLINIC_OR_DEPARTMENT_OTHER): Payer: Medicare Other | Admitting: Anesthesiology

## 2017-11-06 ENCOUNTER — Other Ambulatory Visit: Payer: Self-pay

## 2017-11-06 ENCOUNTER — Encounter (HOSPITAL_BASED_OUTPATIENT_CLINIC_OR_DEPARTMENT_OTHER): Payer: Self-pay | Admitting: Anesthesiology

## 2017-11-06 DIAGNOSIS — M778 Other enthesopathies, not elsewhere classified: Secondary | ICD-10-CM | POA: Diagnosis not present

## 2017-11-06 DIAGNOSIS — M19041 Primary osteoarthritis, right hand: Secondary | ICD-10-CM | POA: Diagnosis not present

## 2017-11-06 DIAGNOSIS — M19031 Primary osteoarthritis, right wrist: Secondary | ICD-10-CM | POA: Diagnosis not present

## 2017-11-06 DIAGNOSIS — Z880 Allergy status to penicillin: Secondary | ICD-10-CM | POA: Insufficient documentation

## 2017-11-06 DIAGNOSIS — K219 Gastro-esophageal reflux disease without esophagitis: Secondary | ICD-10-CM | POA: Insufficient documentation

## 2017-11-06 DIAGNOSIS — M779 Enthesopathy, unspecified: Secondary | ICD-10-CM | POA: Insufficient documentation

## 2017-11-06 DIAGNOSIS — M1A0311 Idiopathic chronic gout, right wrist, with tophus (tophi): Secondary | ICD-10-CM | POA: Diagnosis not present

## 2017-11-06 DIAGNOSIS — E039 Hypothyroidism, unspecified: Secondary | ICD-10-CM | POA: Diagnosis not present

## 2017-11-06 DIAGNOSIS — Z87891 Personal history of nicotine dependence: Secondary | ICD-10-CM | POA: Diagnosis not present

## 2017-11-06 DIAGNOSIS — I10 Essential (primary) hypertension: Secondary | ICD-10-CM | POA: Diagnosis not present

## 2017-11-06 DIAGNOSIS — Z79899 Other long term (current) drug therapy: Secondary | ICD-10-CM | POA: Diagnosis not present

## 2017-11-06 DIAGNOSIS — J45909 Unspecified asthma, uncomplicated: Secondary | ICD-10-CM | POA: Insufficient documentation

## 2017-11-06 DIAGNOSIS — Z7982 Long term (current) use of aspirin: Secondary | ICD-10-CM | POA: Diagnosis not present

## 2017-11-06 DIAGNOSIS — F419 Anxiety disorder, unspecified: Secondary | ICD-10-CM | POA: Diagnosis not present

## 2017-11-06 DIAGNOSIS — M6638 Spontaneous rupture of flexor tendons, other site: Secondary | ICD-10-CM | POA: Diagnosis not present

## 2017-11-06 DIAGNOSIS — M60231 Foreign body granuloma of soft tissue, not elsewhere classified, right forearm: Secondary | ICD-10-CM | POA: Diagnosis not present

## 2017-11-06 HISTORY — PX: FLEXOR TENDON REPAIR: SHX6501

## 2017-11-06 HISTORY — DX: Anxiety disorder, unspecified: F41.9

## 2017-11-06 HISTORY — DX: Unspecified asthma, uncomplicated: J45.909

## 2017-11-06 HISTORY — DX: Hypothyroidism, unspecified: E03.9

## 2017-11-06 SURGERY — REPAIR, TENDON, FLEXOR
Anesthesia: General | Site: Wrist | Laterality: Right

## 2017-11-06 MED ORDER — EPINEPHRINE 30 MG/30ML IJ SOLN
INTRAMUSCULAR | Status: AC
  Filled 2017-11-06: qty 1

## 2017-11-06 MED ORDER — PROPOFOL 10 MG/ML IV BOLUS
INTRAVENOUS | Status: DC | PRN
  Administered 2017-11-06: 50 mg via INTRAVENOUS
  Administered 2017-11-06: 100 mg via INTRAVENOUS

## 2017-11-06 MED ORDER — MIDAZOLAM HCL 2 MG/2ML IJ SOLN: 1.0000 mg | INTRAMUSCULAR | Status: DC | PRN

## 2017-11-06 MED ORDER — FENTANYL CITRATE (PF) 100 MCG/2ML IJ SOLN
INTRAMUSCULAR | Status: AC
  Filled 2017-11-06: qty 2

## 2017-11-06 MED ORDER — METOPROLOL TARTRATE 5 MG/5ML IV SOLN
INTRAVENOUS | Status: DC | PRN
  Administered 2017-11-06: 2.5 mg via INTRAVENOUS

## 2017-11-06 MED ORDER — VANCOMYCIN HCL IN DEXTROSE 1-5 GM/200ML-% IV SOLN
INTRAVENOUS | Status: AC
  Filled 2017-11-06: qty 200

## 2017-11-06 MED ORDER — FENTANYL CITRATE (PF) 100 MCG/2ML IJ SOLN: 50.0000 ug | INTRAMUSCULAR | Status: DC | PRN

## 2017-11-06 MED ORDER — CHLORHEXIDINE GLUCONATE 4 % EX LIQD: 60.0000 mL | Freq: Once | CUTANEOUS | Status: DC

## 2017-11-06 MED ORDER — DEXAMETHASONE SODIUM PHOSPHATE 10 MG/ML IJ SOLN
INTRAMUSCULAR | Status: AC
  Filled 2017-11-06: qty 1

## 2017-11-06 MED ORDER — LACTATED RINGERS IV SOLN
INTRAVENOUS | Status: DC | PRN
  Administered 2017-11-06: 11:00:00 via INTRAVENOUS

## 2017-11-06 MED ORDER — LIDOCAINE HCL (CARDIAC) 20 MG/ML IV SOLN
INTRAVENOUS | Status: DC | PRN
  Administered 2017-11-06: 30 mg via INTRAVENOUS

## 2017-11-06 MED ORDER — LACTATED RINGERS IV SOLN
INTRAVENOUS | Status: DC
  Administered 2017-11-06: 11:00:00 via INTRAVENOUS

## 2017-11-06 MED ORDER — BUPIVACAINE HCL (PF) 0.25 % IJ SOLN
INTRAMUSCULAR | Status: AC
  Filled 2017-11-06: qty 90

## 2017-11-06 MED ORDER — ONDANSETRON HCL 4 MG/2ML IJ SOLN
INTRAMUSCULAR | Status: AC
  Filled 2017-11-06: qty 2

## 2017-11-06 MED ORDER — PHENYLEPHRINE HCL 10 MG/ML IJ SOLN
INTRAMUSCULAR | Status: DC | PRN
  Administered 2017-11-06 (×4): 80 ug via INTRAVENOUS

## 2017-11-06 MED ORDER — HYDROCODONE-ACETAMINOPHEN 5-325 MG PO TABS: ORAL_TABLET | ORAL | 0 refills | Status: DC

## 2017-11-06 MED ORDER — BUPIVACAINE HCL (PF) 0.25 % IJ SOLN
INTRAMUSCULAR | Status: DC | PRN
Start: 2017-11-06 — End: 2017-11-06
  Administered 2017-11-06: 6 mL

## 2017-11-06 MED ORDER — LIDOCAINE 2% (20 MG/ML) 5 ML SYRINGE
INTRAMUSCULAR | Status: AC
  Filled 2017-11-06: qty 5

## 2017-11-06 MED ORDER — METOPROLOL TARTRATE 5 MG/5ML IV SOLN
INTRAVENOUS | Status: AC
  Filled 2017-11-06: qty 5

## 2017-11-06 MED ORDER — VANCOMYCIN HCL IN DEXTROSE 1-5 GM/200ML-% IV SOLN
1000.0000 mg | INTRAVENOUS | Status: AC
  Administered 2017-11-06: 1000 mg via INTRAVENOUS

## 2017-11-06 MED ORDER — PROPOFOL 10 MG/ML IV BOLUS
INTRAVENOUS | Status: AC
  Filled 2017-11-06: qty 20

## 2017-11-06 MED ORDER — ONDANSETRON HCL 4 MG/2ML IJ SOLN
INTRAMUSCULAR | Status: DC | PRN
Start: 2017-11-06 — End: 2017-11-06
  Administered 2017-11-06: 4 mg via INTRAVENOUS

## 2017-11-06 MED ORDER — SCOPOLAMINE 1 MG/3DAYS TD PT72: 1.0000 | MEDICATED_PATCH | Freq: Once | TRANSDERMAL | Status: DC | PRN

## 2017-11-06 MED ORDER — PHENYLEPHRINE 40 MCG/ML (10ML) SYRINGE FOR IV PUSH (FOR BLOOD PRESSURE SUPPORT)
PREFILLED_SYRINGE | INTRAVENOUS | Status: AC
  Filled 2017-11-06: qty 10

## 2017-11-06 MED ORDER — VANCOMYCIN HCL 1000 MG IV SOLR
INTRAVENOUS | Status: DC | PRN
  Administered 2017-11-06: 1000 mg via INTRAVENOUS

## 2017-11-06 MED ORDER — DEXAMETHASONE SODIUM PHOSPHATE 4 MG/ML IJ SOLN
INTRAMUSCULAR | Status: DC | PRN
  Administered 2017-11-06: 10 mg via INTRAVENOUS

## 2017-11-06 MED ORDER — FENTANYL CITRATE (PF) 100 MCG/2ML IJ SOLN
INTRAMUSCULAR | Status: DC | PRN
  Administered 2017-11-06: 100 ug via INTRAVENOUS

## 2017-11-06 SURGICAL SUPPLY — 78 items
BAG DECANTER FOR FLEXI CONT (MISCELLANEOUS) IMPLANT
BALL CTTN LRG ABS STRL LF (GAUZE/BANDAGES/DRESSINGS)
BANDAGE ACE 3X5.8 VEL STRL LF (GAUZE/BANDAGES/DRESSINGS) ×2 IMPLANT
BLADE MINI RND TIP GREEN BEAV (BLADE) IMPLANT
BLADE SURG 15 STRL LF DISP TIS (BLADE) ×2 IMPLANT
BLADE SURG 15 STRL SS (BLADE) ×4
BNDG CMPR 9X4 STRL LF SNTH (GAUZE/BANDAGES/DRESSINGS) ×1
BNDG CONFORM 2 STRL LF (GAUZE/BANDAGES/DRESSINGS) IMPLANT
BNDG ELASTIC 2X5.8 VLCR STR LF (GAUZE/BANDAGES/DRESSINGS) IMPLANT
BNDG ESMARK 4X9 LF (GAUZE/BANDAGES/DRESSINGS) ×2 IMPLANT
BNDG GAUZE ELAST 4 BULKY (GAUZE/BANDAGES/DRESSINGS) IMPLANT
CATH ROBINSON RED A/P 10FR (CATHETERS) IMPLANT
CHLORAPREP W/TINT 26ML (MISCELLANEOUS) ×2 IMPLANT
CORD BIPOLAR FORCEPS 12FT (ELECTRODE) ×2 IMPLANT
COTTONBALL LRG STERILE PKG (GAUZE/BANDAGES/DRESSINGS) IMPLANT
COVER BACK TABLE 60X90IN (DRAPES) ×2 IMPLANT
COVER MAYO STAND STRL (DRAPES) ×2 IMPLANT
CUFF TOURNIQUET SINGLE 18IN (TOURNIQUET CUFF) ×2 IMPLANT
DECANTER SPIKE VIAL GLASS SM (MISCELLANEOUS) IMPLANT
DRAIN TLS ROUND 10FR (DRAIN) IMPLANT
DRAPE EXTREMITY T 121X128X90 (DRAPE) ×2 IMPLANT
DRAPE OEC MINIVIEW 54X84 (DRAPES) IMPLANT
DRAPE SURG 17X23 STRL (DRAPES) ×2 IMPLANT
DRSG PAD ABDOMINAL 8X10 ST (GAUZE/BANDAGES/DRESSINGS) ×1 IMPLANT
GAUZE SPONGE 4X4 12PLY STRL (GAUZE/BANDAGES/DRESSINGS) ×2 IMPLANT
GAUZE SPONGE 4X4 16PLY XRAY LF (GAUZE/BANDAGES/DRESSINGS) IMPLANT
GAUZE XEROFORM 1X8 LF (GAUZE/BANDAGES/DRESSINGS) ×2 IMPLANT
GLOVE BIO SURGEON STRL SZ7.5 (GLOVE) ×2 IMPLANT
GLOVE BIOGEL PI IND STRL 8 (GLOVE) ×1 IMPLANT
GLOVE BIOGEL PI IND STRL 8.5 (GLOVE) IMPLANT
GLOVE BIOGEL PI INDICATOR 8 (GLOVE) ×1
GLOVE BIOGEL PI INDICATOR 8.5 (GLOVE)
GLOVE SURG ORTHO 8.0 STRL STRW (GLOVE) IMPLANT
GOWN STRL REUS W/ TWL LRG LVL3 (GOWN DISPOSABLE) ×1 IMPLANT
GOWN STRL REUS W/TWL LRG LVL3 (GOWN DISPOSABLE) ×2
GOWN STRL REUS W/TWL XL LVL3 (GOWN DISPOSABLE) ×2 IMPLANT
K-WIRE .035X4 (WIRE) IMPLANT
LOOP VESSEL MAXI BLUE (MISCELLANEOUS) IMPLANT
NDL HYPO 25X1 1.5 SAFETY (NEEDLE) IMPLANT
NDL KEITH (NEEDLE) IMPLANT
NEEDLE HYPO 25X1 1.5 SAFETY (NEEDLE) ×2 IMPLANT
NEEDLE KEITH (NEEDLE) IMPLANT
NS IRRIG 1000ML POUR BTL (IV SOLUTION) ×2 IMPLANT
PACK BASIN DAY SURGERY FS (CUSTOM PROCEDURE TRAY) ×2 IMPLANT
PAD CAST 3X4 CTTN HI CHSV (CAST SUPPLIES) ×1 IMPLANT
PAD CAST 4YDX4 CTTN HI CHSV (CAST SUPPLIES) IMPLANT
PADDING CAST ABS 3INX4YD NS (CAST SUPPLIES)
PADDING CAST ABS 4INX4YD NS (CAST SUPPLIES) ×1
PADDING CAST ABS COTTON 3X4 (CAST SUPPLIES) IMPLANT
PADDING CAST ABS COTTON 4X4 ST (CAST SUPPLIES) ×1 IMPLANT
PADDING CAST COTTON 3X4 STRL (CAST SUPPLIES) ×2
PADDING CAST COTTON 4X4 STRL (CAST SUPPLIES)
SLEEVE SCD COMPRESS KNEE MED (MISCELLANEOUS) IMPLANT
SPLINT PLASTER CAST XFAST 3X15 (CAST SUPPLIES) IMPLANT
SPLINT PLASTER CAST XFAST 4X15 (CAST SUPPLIES) IMPLANT
SPLINT PLASTER XTRA FAST SET 4 (CAST SUPPLIES)
SPLINT PLASTER XTRA FASTSET 3X (CAST SUPPLIES)
STOCKINETTE 4X48 STRL (DRAPES) ×2 IMPLANT
SUT CHROMIC 5 0 P 3 (SUTURE) ×1 IMPLANT
SUT ETHIBOND 3-0 V-5 (SUTURE) IMPLANT
SUT ETHILON 3 0 PS 1 (SUTURE) IMPLANT
SUT ETHILON 4 0 PS 2 18 (SUTURE) IMPLANT
SUT FIBERWIRE 4-0 18 DIAM BLUE (SUTURE)
SUT MERSILENE 2.0 SH NDLE (SUTURE) IMPLANT
SUT MERSILENE 4 0 P 3 (SUTURE) IMPLANT
SUT PROLENE 2 0 SH DA (SUTURE) IMPLANT
SUT PROLENE 5 0 P 3 (SUTURE) IMPLANT
SUT SILK 4 0 PS 2 (SUTURE) ×1 IMPLANT
SUT SUPRAMID 4-0 (SUTURE) IMPLANT
SUT VIC AB 4-0 P-3 18XBRD (SUTURE) IMPLANT
SUT VIC AB 4-0 P3 18 (SUTURE)
SUT VICRYL 4-0 PS2 18IN ABS (SUTURE) IMPLANT
SUTURE FIBERWR 4-0 18 DIA BLUE (SUTURE) IMPLANT
SYR BULB 3OZ (MISCELLANEOUS) ×2 IMPLANT
SYR CONTROL 10ML LL (SYRINGE) ×1 IMPLANT
TOWEL OR 17X24 6PK STRL BLUE (TOWEL DISPOSABLE) ×4 IMPLANT
TUBE FEEDING ENTERAL 5FR 16IN (TUBING) IMPLANT
UNDERPAD 30X30 (UNDERPADS AND DIAPERS) ×2 IMPLANT

## 2017-11-06 NOTE — Anesthesia Preprocedure Evaluation (Signed)
Anesthesia Evaluation  Patient identified by MRN, date of birth, ID band Patient awake    Reviewed: Allergy & Precautions, NPO status , Patient's Chart, lab work & pertinent test results  Airway Mallampati: II  TM Distance: >3 FB Neck ROM: Full    Dental  (+) Dental Advisory Given   Pulmonary asthma , former smoker,    Pulmonary exam normal        Cardiovascular hypertension, Pt. on medications + Peripheral Vascular Disease   Rhythm:Regular Rate:Normal     Neuro/Psych negative neurological ROS     GI/Hepatic Neg liver ROS, GERD  Medicated,  Endo/Other  Hypothyroidism   Renal/GU negative Renal ROS     Musculoskeletal  (+) Arthritis ,   Abdominal   Peds  Hematology negative hematology ROS (+)   Anesthesia Other Findings   Reproductive/Obstetrics                             Anesthesia Physical Anesthesia Plan  ASA: II  Anesthesia Plan: General   Post-op Pain Management:    Induction: Intravenous  PONV Risk Score and Plan: 3 and Ondansetron, Dexamethasone and Treatment may vary due to age or medical condition  Airway Management Planned: LMA  Additional Equipment:   Intra-op Plan:   Post-operative Plan: Extubation in OR  Informed Consent: I have reviewed the patients History and Physical, chart, labs and discussed the procedure including the risks, benefits and alternatives for the proposed anesthesia with the patient or authorized representative who has indicated his/her understanding and acceptance.   Dental advisory given  Plan Discussed with: CRNA  Anesthesia Plan Comments:         Anesthesia Quick Evaluation

## 2017-11-06 NOTE — Anesthesia Procedure Notes (Signed)
Procedure Name: LMA Insertion Date/Time: 11/06/2017 12:14 PM Performed by: Marrianne Mood, CRNA Pre-anesthesia Checklist: Patient identified, Emergency Drugs available, Suction available, Patient being monitored and Timeout performed Patient Re-evaluated:Patient Re-evaluated prior to induction Oxygen Delivery Method: Circle system utilized Preoxygenation: Pre-oxygenation with 100% oxygen Induction Type: IV induction Ventilation: Mask ventilation without difficulty LMA: LMA inserted LMA Size: 4.0 Number of attempts: 1 Airway Equipment and Method: Bite block Placement Confirmation: positive ETCO2 Tube secured with: Tape Dental Injury: Teeth and Oropharynx as per pre-operative assessment

## 2017-11-06 NOTE — Op Note (Signed)
[id number] 

## 2017-11-06 NOTE — Brief Op Note (Signed)
11/06/2017  12:59 PM  PATIENT:  [name]  76 y.o. 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  POST-OPERATIVE DIAGNOSIS:  RIGHT WRIST FLEXOR CARPAL RADIALIS TENDINITISAND WRIST ARTHRITIS  PROCEDURE:  Procedure(s): RIGHT WRIST FLEXOR TENDON REPAIRAND STT DEBRIEDMENT (Right)  SURGEON:  Surgeon(s) and Role:    * Leanora Cover, MD - Primary  PHYSICIAN ASSISTANT:   ASSISTANTS: none   ANESTHESIA:   general  EBL:  2 mL   BLOOD ADMINISTERED:none  DRAINS: none   LOCAL MEDICATIONS USED:  MARCAINE     SPECIMEN:  Source of Specimen:  right wrist  DISPOSITION OF SPECIMEN:  PATHOLOGY  COUNTS:  YES  TOURNIQUET:   Total Tourniquet Time Documented: Upper Arm (Right) - 30 minutes Total: Upper Arm (Right) - 30 minutes   DICTATION: .Other Dictation: Dictation Number [id number]  PLAN OF CARE: Discharge to home after PACU  PATIENT DISPOSITION:  PACU - hemodynamically stable.

## 2017-11-06 NOTE — Transfer of Care (Signed)
Immediate Anesthesia Transfer of Care Note  Patient: [name]  Procedure(s) Performed: RIGHT WRIST FLEXOR TENDON REPAIRAND STT DEBRIEDMENT (Right Wrist)  Patient Location: PACU  Anesthesia Type:General  Level of Consciousness: awake and patient cooperative  Airway & Oxygen Therapy: Patient Spontanous Breathing and Patient connected to face mask oxygen  Post-op Assessment: Report given to RN and Post -op Vital signs reviewed and stable  Post vital signs: Reviewed and stable  Last Vitals:  Vitals:   11/06/17 1037  BP: (!) 146/76  Pulse: 73  Resp: 18  Temp: 36.7 C  SpO2: 100%    Last Pain:  Vitals:   11/06/17 1037  TempSrc: Oral         Complications: No apparent anesthesia complications

## 2017-11-06 NOTE — H&P (Signed)
[name] is an 76 y.o. female.   Chief Complaint: right wrist pain HPI: 76 yo female with right FCR tendon pain and STT arthritis.  She wishes to have release of FCR tendon and debridement of joint with possible FCR tendon release if it is attritional.  Allergies:  Allergies  Allergen Reactions  . Augmentin [Amoxicillin-Pot Clavulanate] Nausea And Vomiting and Other (See Comments)    Has patient had a PCN reaction causing immediate rash, facial/tongue/throat swelling, SOB or lightheadedness with hypotension: No Has patient had a PCN reaction causing severe rash involving mucus membranes or skin necrosis: No Has patient had a PCN reaction that required hospitalization: No Has patient had a PCN reaction occurring within the last 10 years: No If all of the above answers are "NO", then may proceed with Cephalosporin use.  . Ace Inhibitors Cough  . Entex La Itching  . Levofloxacin Hives and Itching    Past Medical History:  Diagnosis Date  . Anemia   . Anxiety   . Arthritis   . Asthma   . Constipation   . GERD (gastroesophageal reflux disease)   . High blood pressure   . Hypothyroidism   . Seasonal allergies   . Thyroid disease   . Varicose veins   . Vertigo 11/23/2013    Past Surgical History:  Procedure Laterality Date  . APPENDECTOMY    . CATARACT EXTRACTION W/PHACO Left 01/27/2017   Procedure: CATARACT EXTRACTION PHACO AND INTRAOCULAR LENS PLACEMENT (IOC);  Surgeon: Tonny Branch, MD;  Location: [facility];  Service: Ophthalmology;  Laterality: Left;  CDE: 29.90  . CATARACT EXTRACTION W/PHACO Right 02/24/2017   Procedure: CATARACT EXTRACTION PHACO AND INTRAOCULAR LENS PLACEMENT (IOC);  Surgeon: Tonny Branch, MD;  Location: [facility];  Service: Ophthalmology;  Laterality: Right;  CDE: 8.57  . CHOLECYSTECTOMY    . COLONOSCOPY N/A 10/01/2016   Procedure: COLONOSCOPY;  Surgeon: Daneil Dolin, MD;  Location: [facility];  Service: Endoscopy;  Laterality: N/A;  815   . TUBAL  LIGATION      Family History: Family History  Problem Relation Age of Onset  . Hypertension Mother   . Varicose Veins Father   . Heart attack Father   . Heart disease Father   . Hypertension Brother   . COPD Brother     Social History:   reports that she quit smoking about 19 years ago. Her smoking use included cigarettes. She has a 1.25 pack-year smoking history. she has never used smokeless tobacco. She reports that she does not drink alcohol or use drugs.  Medications: Medications Prior to Admission  Medication Sig Dispense Refill  . ALPRAZolam (XANAX) 0.5 MG tablet Take 0.5 mg by mouth at bedtime.     Marland Kitchen amLODipine (NORVASC) 2.5 MG tablet Take 2.5 mg by mouth every evening.     Marland Kitchen aspirin EC 81 MG tablet Take 81 mg by mouth daily.    . budesonide-formoterol (SYMBICORT) 80-4.5 MCG/ACT inhaler Inhale 2 puffs into the lungs 2 (two) times daily. 3 Inhaler 0  . docusate sodium (COLACE) 50 MG capsule Take 100 mg by mouth at bedtime.    . famotidine (PEPCID) 20 MG tablet TAKE 1 TABLET EVERY DAY AT BEDTIME 90 tablet 0  . fexofenadine (ALLEGRA) 180 MG tablet Take 180 mg by mouth daily.    . fluticasone (FLONASE) 50 MCG/ACT nasal spray Place 2 sprays into both nostrils daily as needed for allergies or rhinitis.    Marland Kitchen gabapentin (NEURONTIN) 100 MG capsule  Take 1 capsule (100 mg total) by mouth every 8 (eight) hours as needed. (Patient taking differently: Take 100 mg by mouth 3 (three) times daily as needed (for pain). ) 60 capsule 3  . hydrochlorothiazide (HYDRODIURIL) 25 MG tablet Take 25 mg by mouth daily.     Marland Kitchen levothyroxine (SYNTHROID, LEVOTHROID) 125 MCG tablet Take 67.5 mcg by mouth daily before breakfast.     . losartan (COZAAR) 50 MG tablet Take 25 mg by mouth daily.     . Magnesium 400 MG TABS Take 400 mg by mouth at bedtime.     . meloxicam (MOBIC) 7.5 MG tablet     . methocarbamol (ROBAXIN) 750 MG tablet Take 750-1,500 mg by mouth every 8 (eight) hours as needed for muscle spasms.      . Multiple Vitamin (MULTIVITAMIN) tablet Take 1 tablet by mouth daily.    . NONFORMULARY OR COMPOUNDED ITEM Apply 1 application topically daily as needed (pain). Pt applies to her feet for Neuropathy.     Diclofenac 3%, Baclofen 2%, Gabapentin 5%, Lidocaine 5%, Menthol 1%     . pantoprazole (PROTONIX) 40 MG tablet TAKE 1 TABLET BY MOUTH ONCE DAILY 30 TO 60 MINUTES BEFORE FIRST MEAL OF THE DAY. 90 tablet 0  . potassium chloride SA (K-DUR,KLOR-CON) 20 MEQ tablet Take 2 tablets (40 mEq total) by mouth daily. 6 tablet 0  . raloxifene (EVISTA) 60 MG tablet TAKE 1 TABLET BY MOUTH EVERY MORNING. 30 tablet 6  . traMADol (ULTRAM) 50 MG tablet Take 1 tablet by mouth daily as needed for moderate pain or severe pain.     Marland Kitchen albuterol (VENTOLIN HFA) 108 (90 Base) MCG/ACT inhaler Inhale 2 puffs into the lungs every 6 (six) hours as needed for wheezing or shortness of breath.    . furosemide (LASIX) 20 MG tablet Take 20 mg by mouth daily as needed (for swelling).     Marland Kitchen L-Lysine 500 MG TABS Take 500 tablets by mouth daily.    . metoCLOPramide (REGLAN) 10 MG tablet Take 0.5 tablets (5 mg total) by mouth every 6 (six) hours as needed for nausea (nausea/headache). 6 tablet 0    Results for orders placed or performed during the hospital encounter of 11/05/17 (from the past 48 hour(s))  Basic metabolic panel     Status: Abnormal   Collection Time: 11/05/17  9:45 AM  Result Value Ref Range   Sodium 138 135 - 145 mmol/L   Potassium 4.1 3.5 - 5.1 mmol/L   Chloride 105 101 - 111 mmol/L   CO2 27 22 - 32 mmol/L   Glucose, Bld 87 65 - 99 mg/dL   BUN 16 6 - 20 mg/dL   Creatinine, Ser 0.83 0.44 - 1.00 mg/dL   Calcium 8.7 (L) 8.9 - 10.3 mg/dL   GFR calc non Af Amer >60 >60 mL/min   GFR calc Af Amer >60 >60 mL/min    Comment: (NOTE) The eGFR has been calculated using the CKD EPI equation. This calculation has not been validated in all clinical situations. eGFR's persistently <60 mL/min signify possible Chronic  Kidney Disease.    Anion gap 6 5 - 15    No results found.   A comprehensive review of systems was negative.  Blood pressure (!) 146/76, pulse 73, temperature 98 F (36.7 C), temperature source Oral, resp. rate 18, height '5\' 4"'$  (1.626 m), weight 72.6 kg (160 lb), SpO2 100 %.  General appearance: alert, cooperative and appears stated age Head: Normocephalic, without obvious  abnormality, atraumatic Neck: supple, symmetrical, trachea midline Resp: clear to auscultation bilaterally Cardio: regular rate and rhythm GI: non-tender Extremities: Intact sensation and capillary refill all digits.  +epl/fpl/io.  No wounds.  Pulses: 2+ and symmetric Skin: Skin color, texture, turgor normal. No rashes or lesions Neurologic: Grossly normal Incision/Wound:none  Assessment/Plan Right FCR tendinitis with possible attrition and STT arthritis.  Plan FCR decompression with possible release if nearly attritional rupture and debridement STT joint.  Non operative and operative treatment options were discussed with the patient and patient wishes to proceed with operative treatment. Risks, benefits, and alternatives of surgery were discussed and the patient agrees with the plan of care.   Kateryn Marasigan R 11/06/2017, 12:06 PM

## 2017-11-06 NOTE — Op Note (Signed)
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  POSTOPERATIVE DIAGNOSIS:  Right wrist flexor carpi radialis tendinitis and scaphotrapeziotrapezoidal arthritis.  PROCEDURE:  Right wrist release of FCR tendon sheath and debridement of the STT joint with removal of subcutaneous mass/foreign matter 0.5 cm in diameter.  SURGEON:  Leanora Cover, MD.  ASSISTANT:  None.  ANESTHESIA:  General.  IV FLUIDS:  Per anesthesia flow sheet.  ESTIMATED BLOOD LOSS:  Minimal.  COMPLICATIONS:  None.  SPECIMENS:  Right wrist mass to Pathology.  TOURNIQUET TIME:  30 minutes.  DISPOSITION:  Stable to PACU.  INDICATIONS:  Ms. Harten is a 76 year old female, who has been having pain at her right wrist over the FCR tendon.  She has MRI showing degenerative tearing of the FCR tendon and inflammation as well as arthritic changes.  She wished to undergo FCR decompression with debridement of STT joint.  Risks, benefits, and alternatives of surgery were discussed including the risk of blood loss; infection; damage to nerves, vessels, tendons, ligaments, bone; failure of surgery; need for additional surgery; complications with wound healing; continued pain. She voiced understanding of these risks, elected to proceed.  OPERATIVE COURSE:  After being identified preoperatively by myself, the patient and I agreed upon procedure and site of procedure.  Surgical site was marked.  Risks, benefits, and alternatives of surgery were reviewed and she wished to proceed.  Surgical consent had been signed. She was given IV vancomycin as preoperative  antibiotic prophylaxis due to PENICILLIN allergy.  She was transferred to the operating room and placed on the operating room table in supine position with the right upper extremity on arm board.  General anesthesia was induced by anesthesiologist.  Right upper extremity was prepped and draped in normal sterile orthopedic fashion.  Surgical pause was performed between surgeons, Anesthesia, and operating room staff; and all were in agreement as to the patient, procedure, and site of procedure. Tourniquet at the proximal aspect of the extremity was inflated to 250 mmHg after exsanguination of the limb with an Esmarch bandage.  Incision was made over the FCR tendon and carried into subcutaneous tissues by spreading technique.  Bipolar electrocautery was used to obtain hemostasis.  The radial artery was identified and protected throughout the case.  The superficial branch was large.  There was also a whitish material in the subcutaneous tissues appeared like gouty tophus or retained steroid injection.  This was removed and sent to Pathology for examination.  The FCR tendon sheath was incised.  The FCR tendon had ruptured and retracted.  There was blood within the sheath.  The sheath was released distally.  There was some frayed tendon remaining, this was debrided.  There was a rent in the capsule  and osteophyte from the STT joint sticking through.  This was prominent.  It caused a bump at the volar radial aspect of the wrist.  The osteophyte was cleared of soft tissue and rongeurs were used to remove the osteophyte from the carpus. This provided better contour to the wrist and non palpable mass that was present before.  The wound was copiously irrigated with sterile saline. The rent in the capsule was closed with 5-0 chromic suture in a figure- of-eight fashion.  The skin was closed with 4-0 nylon in a horizontal mattress fashion.  The wound was injected with 0.25% plain Marcaine to aid in  postoperative analgesia.  It was then dressed with sterile Xeroform, 4x4s, and ABD and wrapped with Kerlix and Ace bandage. Tourniquet was deflated at 30 minutes.  Fingertips were pink with brisk capillary refill after deflation of tourniquet.  The operative drapes were broken down.  The patient was awoken from anesthesia safely.  He was transferred back to the stretcher and taken to PACU in stable condition.  I will see her back in the office in 1 week for postoperative followup.  I will give her Norco 5/325 one to two p.o. q.6 hours p.r.n. pain, dispensed #20.     Leanora Cover, MD     KK/MEDQ  D:  11/06/2017  T:  11/06/2017  Job:  [id number]

## 2017-11-06 NOTE — Discharge Instructions (Addendum)

## 2017-11-07 ENCOUNTER — Encounter (HOSPITAL_BASED_OUTPATIENT_CLINIC_OR_DEPARTMENT_OTHER): Payer: Self-pay | Admitting: Orthopedic Surgery

## 2017-11-07 NOTE — Anesthesia Postprocedure Evaluation (Signed)
Anesthesia Post Note  Patient: [name]  Procedure(s) Performed: RIGHT WRIST FLEXOR TENDON REPAIRAND STT DEBRIEDMENT (Right Wrist)     Patient location during evaluation: PACU Anesthesia Type: General Level of consciousness: awake and alert Pain management: pain level controlled Vital Signs Assessment: post-procedure vital signs reviewed and stable Respiratory status: spontaneous breathing, nonlabored ventilation, respiratory function stable and patient connected to nasal cannula oxygen Cardiovascular status: blood pressure returned to baseline and stable Postop Assessment: no apparent nausea or vomiting Anesthetic complications: no    Last Vitals:  Vitals:   11/06/17 1345 11/06/17 1413  BP:  134/90  Pulse: 66 69  Resp: (!) 21 18  Temp:  36.5 C  SpO2: 96% 96%    Last Pain:  Vitals:   11/06/17 1413  TempSrc: Oral  PainSc: 0-No pain                 Tiajuana Amass

## 2017-11-21 DIAGNOSIS — M545 Low back pain: Secondary | ICD-10-CM | POA: Diagnosis not present

## 2017-11-21 DIAGNOSIS — M546 Pain in thoracic spine: Secondary | ICD-10-CM | POA: Diagnosis not present

## 2017-11-21 DIAGNOSIS — M9902 Segmental and somatic dysfunction of thoracic region: Secondary | ICD-10-CM | POA: Diagnosis not present

## 2017-11-21 DIAGNOSIS — M9905 Segmental and somatic dysfunction of pelvic region: Secondary | ICD-10-CM | POA: Diagnosis not present

## 2017-11-21 DIAGNOSIS — M9903 Segmental and somatic dysfunction of lumbar region: Secondary | ICD-10-CM | POA: Diagnosis not present

## 2017-11-21 DIAGNOSIS — I10 Essential (primary) hypertension: Secondary | ICD-10-CM | POA: Diagnosis not present

## 2017-11-27 DIAGNOSIS — M9902 Segmental and somatic dysfunction of thoracic region: Secondary | ICD-10-CM | POA: Diagnosis not present

## 2017-11-27 DIAGNOSIS — M9903 Segmental and somatic dysfunction of lumbar region: Secondary | ICD-10-CM | POA: Diagnosis not present

## 2017-11-27 DIAGNOSIS — M545 Low back pain: Secondary | ICD-10-CM | POA: Diagnosis not present

## 2017-11-27 DIAGNOSIS — M9905 Segmental and somatic dysfunction of pelvic region: Secondary | ICD-10-CM | POA: Diagnosis not present

## 2017-11-27 DIAGNOSIS — M546 Pain in thoracic spine: Secondary | ICD-10-CM | POA: Diagnosis not present

## 2017-11-27 DIAGNOSIS — I10 Essential (primary) hypertension: Secondary | ICD-10-CM | POA: Diagnosis not present

## 2017-11-28 ENCOUNTER — Other Ambulatory Visit: Payer: Self-pay | Admitting: Internal Medicine

## 2017-12-05 DIAGNOSIS — M9905 Segmental and somatic dysfunction of pelvic region: Secondary | ICD-10-CM | POA: Diagnosis not present

## 2017-12-05 DIAGNOSIS — M546 Pain in thoracic spine: Secondary | ICD-10-CM | POA: Diagnosis not present

## 2017-12-05 DIAGNOSIS — I10 Essential (primary) hypertension: Secondary | ICD-10-CM | POA: Diagnosis not present

## 2017-12-05 DIAGNOSIS — M9902 Segmental and somatic dysfunction of thoracic region: Secondary | ICD-10-CM | POA: Diagnosis not present

## 2017-12-05 DIAGNOSIS — M545 Low back pain: Secondary | ICD-10-CM | POA: Diagnosis not present

## 2017-12-05 DIAGNOSIS — M9903 Segmental and somatic dysfunction of lumbar region: Secondary | ICD-10-CM | POA: Diagnosis not present

## 2017-12-11 DIAGNOSIS — M9903 Segmental and somatic dysfunction of lumbar region: Secondary | ICD-10-CM | POA: Diagnosis not present

## 2017-12-11 DIAGNOSIS — M545 Low back pain: Secondary | ICD-10-CM | POA: Diagnosis not present

## 2017-12-11 DIAGNOSIS — M9902 Segmental and somatic dysfunction of thoracic region: Secondary | ICD-10-CM | POA: Diagnosis not present

## 2017-12-11 DIAGNOSIS — M9905 Segmental and somatic dysfunction of pelvic region: Secondary | ICD-10-CM | POA: Diagnosis not present

## 2017-12-11 DIAGNOSIS — M546 Pain in thoracic spine: Secondary | ICD-10-CM | POA: Diagnosis not present

## 2018-01-09 DIAGNOSIS — J069 Acute upper respiratory infection, unspecified: Secondary | ICD-10-CM | POA: Diagnosis not present

## 2018-01-09 DIAGNOSIS — Z6827 Body mass index (BMI) 27.0-27.9, adult: Secondary | ICD-10-CM | POA: Diagnosis not present

## 2018-01-09 DIAGNOSIS — R070 Pain in throat: Secondary | ICD-10-CM | POA: Diagnosis not present

## 2018-01-19 DIAGNOSIS — E119 Type 2 diabetes mellitus without complications: Secondary | ICD-10-CM | POA: Diagnosis not present

## 2018-01-19 DIAGNOSIS — I10 Essential (primary) hypertension: Secondary | ICD-10-CM | POA: Diagnosis not present

## 2018-01-19 DIAGNOSIS — E785 Hyperlipidemia, unspecified: Secondary | ICD-10-CM | POA: Diagnosis not present

## 2018-01-19 DIAGNOSIS — E039 Hypothyroidism, unspecified: Secondary | ICD-10-CM | POA: Diagnosis not present

## 2018-01-21 ENCOUNTER — Other Ambulatory Visit: Payer: Self-pay | Admitting: Internal Medicine

## 2018-01-21 DIAGNOSIS — J45909 Unspecified asthma, uncomplicated: Secondary | ICD-10-CM | POA: Diagnosis not present

## 2018-01-21 DIAGNOSIS — E039 Hypothyroidism, unspecified: Secondary | ICD-10-CM | POA: Diagnosis not present

## 2018-01-21 DIAGNOSIS — E785 Hyperlipidemia, unspecified: Secondary | ICD-10-CM | POA: Diagnosis not present

## 2018-01-21 DIAGNOSIS — K219 Gastro-esophageal reflux disease without esophagitis: Secondary | ICD-10-CM | POA: Diagnosis not present

## 2018-01-21 DIAGNOSIS — E119 Type 2 diabetes mellitus without complications: Secondary | ICD-10-CM | POA: Diagnosis not present

## 2018-01-21 DIAGNOSIS — I10 Essential (primary) hypertension: Secondary | ICD-10-CM | POA: Diagnosis not present

## 2018-01-21 DIAGNOSIS — M8580 Other specified disorders of bone density and structure, unspecified site: Secondary | ICD-10-CM | POA: Diagnosis not present

## 2018-01-21 DIAGNOSIS — M79606 Pain in leg, unspecified: Secondary | ICD-10-CM | POA: Diagnosis not present

## 2018-01-21 DIAGNOSIS — M5430 Sciatica, unspecified side: Secondary | ICD-10-CM | POA: Diagnosis not present

## 2018-01-23 ENCOUNTER — Other Ambulatory Visit: Payer: Self-pay | Admitting: Internal Medicine

## 2018-01-25 ENCOUNTER — Other Ambulatory Visit: Payer: Self-pay | Admitting: Internal Medicine

## 2018-01-27 ENCOUNTER — Other Ambulatory Visit: Payer: Self-pay | Admitting: Internal Medicine

## 2018-02-02 ENCOUNTER — Other Ambulatory Visit: Payer: Self-pay | Admitting: Internal Medicine

## 2018-02-03 DIAGNOSIS — E039 Hypothyroidism, unspecified: Secondary | ICD-10-CM | POA: Diagnosis not present

## 2018-02-03 DIAGNOSIS — M79606 Pain in leg, unspecified: Secondary | ICD-10-CM | POA: Diagnosis not present

## 2018-02-03 DIAGNOSIS — R7301 Impaired fasting glucose: Secondary | ICD-10-CM | POA: Diagnosis not present

## 2018-02-03 DIAGNOSIS — M25559 Pain in unspecified hip: Secondary | ICD-10-CM | POA: Diagnosis not present

## 2018-02-03 DIAGNOSIS — G629 Polyneuropathy, unspecified: Secondary | ICD-10-CM | POA: Diagnosis not present

## 2018-02-03 DIAGNOSIS — E119 Type 2 diabetes mellitus without complications: Secondary | ICD-10-CM | POA: Diagnosis not present

## 2018-02-03 DIAGNOSIS — Z6828 Body mass index (BMI) 28.0-28.9, adult: Secondary | ICD-10-CM | POA: Diagnosis not present

## 2018-02-03 DIAGNOSIS — I10 Essential (primary) hypertension: Secondary | ICD-10-CM | POA: Diagnosis not present

## 2018-02-03 DIAGNOSIS — E785 Hyperlipidemia, unspecified: Secondary | ICD-10-CM | POA: Diagnosis not present

## 2018-02-13 DIAGNOSIS — J019 Acute sinusitis, unspecified: Secondary | ICD-10-CM | POA: Diagnosis not present

## 2018-02-13 DIAGNOSIS — G629 Polyneuropathy, unspecified: Secondary | ICD-10-CM | POA: Diagnosis not present

## 2018-02-13 DIAGNOSIS — I10 Essential (primary) hypertension: Secondary | ICD-10-CM | POA: Diagnosis not present

## 2018-02-13 DIAGNOSIS — E039 Hypothyroidism, unspecified: Secondary | ICD-10-CM | POA: Diagnosis not present

## 2018-02-13 DIAGNOSIS — R7301 Impaired fasting glucose: Secondary | ICD-10-CM | POA: Diagnosis not present

## 2018-02-13 DIAGNOSIS — E785 Hyperlipidemia, unspecified: Secondary | ICD-10-CM | POA: Diagnosis not present

## 2018-02-13 DIAGNOSIS — M797 Fibromyalgia: Secondary | ICD-10-CM | POA: Diagnosis not present

## 2018-02-13 DIAGNOSIS — E119 Type 2 diabetes mellitus without complications: Secondary | ICD-10-CM | POA: Diagnosis not present

## 2018-02-13 DIAGNOSIS — G4709 Other insomnia: Secondary | ICD-10-CM | POA: Diagnosis not present

## 2018-02-24 ENCOUNTER — Other Ambulatory Visit: Payer: Self-pay | Admitting: Internal Medicine

## 2018-02-26 DIAGNOSIS — R7301 Impaired fasting glucose: Secondary | ICD-10-CM | POA: Diagnosis not present

## 2018-02-26 DIAGNOSIS — E039 Hypothyroidism, unspecified: Secondary | ICD-10-CM | POA: Diagnosis not present

## 2018-02-26 DIAGNOSIS — I10 Essential (primary) hypertension: Secondary | ICD-10-CM | POA: Diagnosis not present

## 2018-02-26 DIAGNOSIS — M797 Fibromyalgia: Secondary | ICD-10-CM | POA: Diagnosis not present

## 2018-02-26 DIAGNOSIS — E119 Type 2 diabetes mellitus without complications: Secondary | ICD-10-CM | POA: Diagnosis not present

## 2018-02-26 DIAGNOSIS — M79606 Pain in leg, unspecified: Secondary | ICD-10-CM | POA: Diagnosis not present

## 2018-02-26 DIAGNOSIS — G629 Polyneuropathy, unspecified: Secondary | ICD-10-CM | POA: Diagnosis not present

## 2018-02-26 DIAGNOSIS — E785 Hyperlipidemia, unspecified: Secondary | ICD-10-CM | POA: Diagnosis not present

## 2018-02-26 DIAGNOSIS — Z6828 Body mass index (BMI) 28.0-28.9, adult: Secondary | ICD-10-CM | POA: Diagnosis not present

## 2018-02-27 ENCOUNTER — Telehealth: Payer: Self-pay | Admitting: Internal Medicine

## 2018-02-27 NOTE — Telephone Encounter (Signed)
Called and spoke with patient, advised her that per the last refill that MW sent in he wanted her to defer to her PCP for further refills. Patient is aware and verbalized understanding. Nothing further needed.

## 2018-04-23 DIAGNOSIS — E119 Type 2 diabetes mellitus without complications: Secondary | ICD-10-CM | POA: Diagnosis not present

## 2018-04-23 DIAGNOSIS — I10 Essential (primary) hypertension: Secondary | ICD-10-CM | POA: Diagnosis not present

## 2018-04-23 DIAGNOSIS — R7301 Impaired fasting glucose: Secondary | ICD-10-CM | POA: Diagnosis not present

## 2018-04-23 DIAGNOSIS — Z6828 Body mass index (BMI) 28.0-28.9, adult: Secondary | ICD-10-CM | POA: Diagnosis not present

## 2018-04-23 DIAGNOSIS — J029 Acute pharyngitis, unspecified: Secondary | ICD-10-CM | POA: Diagnosis not present

## 2018-04-23 DIAGNOSIS — E785 Hyperlipidemia, unspecified: Secondary | ICD-10-CM | POA: Diagnosis not present

## 2018-04-23 DIAGNOSIS — G629 Polyneuropathy, unspecified: Secondary | ICD-10-CM | POA: Diagnosis not present

## 2018-04-23 DIAGNOSIS — E039 Hypothyroidism, unspecified: Secondary | ICD-10-CM | POA: Diagnosis not present

## 2018-04-23 DIAGNOSIS — J060 Acute laryngopharyngitis: Secondary | ICD-10-CM | POA: Diagnosis not present

## 2018-05-04 DIAGNOSIS — J060 Acute laryngopharyngitis: Secondary | ICD-10-CM | POA: Diagnosis not present

## 2018-05-04 DIAGNOSIS — Z6828 Body mass index (BMI) 28.0-28.9, adult: Secondary | ICD-10-CM | POA: Diagnosis not present

## 2018-05-06 DIAGNOSIS — E119 Type 2 diabetes mellitus without complications: Secondary | ICD-10-CM | POA: Diagnosis not present

## 2018-05-06 DIAGNOSIS — G629 Polyneuropathy, unspecified: Secondary | ICD-10-CM | POA: Diagnosis not present

## 2018-05-06 DIAGNOSIS — J060 Acute laryngopharyngitis: Secondary | ICD-10-CM | POA: Diagnosis not present

## 2018-05-06 DIAGNOSIS — G4709 Other insomnia: Secondary | ICD-10-CM | POA: Diagnosis not present

## 2018-05-06 DIAGNOSIS — M797 Fibromyalgia: Secondary | ICD-10-CM | POA: Diagnosis not present

## 2018-05-06 DIAGNOSIS — E785 Hyperlipidemia, unspecified: Secondary | ICD-10-CM | POA: Diagnosis not present

## 2018-05-06 DIAGNOSIS — I10 Essential (primary) hypertension: Secondary | ICD-10-CM | POA: Diagnosis not present

## 2018-05-06 DIAGNOSIS — E039 Hypothyroidism, unspecified: Secondary | ICD-10-CM | POA: Diagnosis not present

## 2018-05-06 DIAGNOSIS — R7301 Impaired fasting glucose: Secondary | ICD-10-CM | POA: Diagnosis not present

## 2018-05-08 ENCOUNTER — Ambulatory Visit (HOSPITAL_COMMUNITY)
Admission: RE | Admit: 2018-05-08 | Discharge: 2018-05-08 | Disposition: A | Discharge status: Home / Self Care | Payer: Medicare HMO | Source: Ambulatory Visit | Attending: Adult Health Nurse Practitioner | Admitting: Adult Health Nurse Practitioner

## 2018-05-08 ENCOUNTER — Other Ambulatory Visit (HOSPITAL_COMMUNITY): Payer: Self-pay | Admitting: Adult Health Nurse Practitioner

## 2018-05-08 DIAGNOSIS — R0602 Shortness of breath: Secondary | ICD-10-CM | POA: Insufficient documentation

## 2018-05-08 DIAGNOSIS — R059 Cough, unspecified: Secondary | ICD-10-CM

## 2018-05-08 DIAGNOSIS — R05 Cough: Secondary | ICD-10-CM

## 2018-05-26 DIAGNOSIS — M797 Fibromyalgia: Secondary | ICD-10-CM | POA: Diagnosis not present

## 2018-05-26 DIAGNOSIS — D509 Iron deficiency anemia, unspecified: Secondary | ICD-10-CM | POA: Diagnosis not present

## 2018-05-26 DIAGNOSIS — G4709 Other insomnia: Secondary | ICD-10-CM | POA: Diagnosis not present

## 2018-05-26 DIAGNOSIS — R5383 Other fatigue: Secondary | ICD-10-CM | POA: Diagnosis not present

## 2018-05-26 DIAGNOSIS — E039 Hypothyroidism, unspecified: Secondary | ICD-10-CM | POA: Diagnosis not present

## 2018-05-26 DIAGNOSIS — Z8639 Personal history of other endocrine, nutritional and metabolic disease: Secondary | ICD-10-CM | POA: Diagnosis not present

## 2018-05-26 DIAGNOSIS — M79606 Pain in leg, unspecified: Secondary | ICD-10-CM | POA: Diagnosis not present

## 2018-05-26 DIAGNOSIS — M5430 Sciatica, unspecified side: Secondary | ICD-10-CM | POA: Diagnosis not present

## 2018-05-26 DIAGNOSIS — R531 Weakness: Secondary | ICD-10-CM | POA: Diagnosis not present

## 2018-05-30 DIAGNOSIS — L03031 Cellulitis of right toe: Secondary | ICD-10-CM | POA: Diagnosis not present

## 2018-06-16 DIAGNOSIS — F331 Major depressive disorder, recurrent, moderate: Secondary | ICD-10-CM | POA: Diagnosis not present

## 2018-06-16 DIAGNOSIS — S91301A Unspecified open wound, right foot, initial encounter: Secondary | ICD-10-CM | POA: Diagnosis not present

## 2018-06-16 DIAGNOSIS — Z6829 Body mass index (BMI) 29.0-29.9, adult: Secondary | ICD-10-CM | POA: Diagnosis not present

## 2018-06-16 DIAGNOSIS — R1030 Lower abdominal pain, unspecified: Secondary | ICD-10-CM | POA: Diagnosis not present

## 2018-06-23 DIAGNOSIS — F331 Major depressive disorder, recurrent, moderate: Secondary | ICD-10-CM | POA: Diagnosis not present

## 2018-06-23 DIAGNOSIS — Z6829 Body mass index (BMI) 29.0-29.9, adult: Secondary | ICD-10-CM | POA: Diagnosis not present

## 2018-06-23 DIAGNOSIS — Z636 Dependent relative needing care at home: Secondary | ICD-10-CM | POA: Diagnosis not present

## 2018-06-23 DIAGNOSIS — L03031 Cellulitis of right toe: Secondary | ICD-10-CM | POA: Diagnosis not present

## 2018-07-16 DIAGNOSIS — G4709 Other insomnia: Secondary | ICD-10-CM | POA: Diagnosis not present

## 2018-07-16 DIAGNOSIS — E785 Hyperlipidemia, unspecified: Secondary | ICD-10-CM | POA: Diagnosis not present

## 2018-07-16 DIAGNOSIS — M797 Fibromyalgia: Secondary | ICD-10-CM | POA: Diagnosis not present

## 2018-07-16 DIAGNOSIS — I10 Essential (primary) hypertension: Secondary | ICD-10-CM | POA: Diagnosis not present

## 2018-07-16 DIAGNOSIS — E119 Type 2 diabetes mellitus without complications: Secondary | ICD-10-CM | POA: Diagnosis not present

## 2018-07-16 DIAGNOSIS — R7301 Impaired fasting glucose: Secondary | ICD-10-CM | POA: Diagnosis not present

## 2018-07-16 DIAGNOSIS — G629 Polyneuropathy, unspecified: Secondary | ICD-10-CM | POA: Diagnosis not present

## 2018-07-16 DIAGNOSIS — E039 Hypothyroidism, unspecified: Secondary | ICD-10-CM | POA: Diagnosis not present

## 2018-07-16 DIAGNOSIS — M79606 Pain in leg, unspecified: Secondary | ICD-10-CM | POA: Diagnosis not present

## 2018-07-17 DIAGNOSIS — M25551 Pain in right hip: Secondary | ICD-10-CM | POA: Diagnosis not present

## 2018-07-17 DIAGNOSIS — M25552 Pain in left hip: Secondary | ICD-10-CM | POA: Insufficient documentation

## 2018-07-17 DIAGNOSIS — M7061 Trochanteric bursitis, right hip: Secondary | ICD-10-CM | POA: Diagnosis not present

## 2018-07-17 DIAGNOSIS — M7062 Trochanteric bursitis, left hip: Secondary | ICD-10-CM | POA: Diagnosis not present

## 2018-07-20 DIAGNOSIS — E039 Hypothyroidism, unspecified: Secondary | ICD-10-CM | POA: Diagnosis not present

## 2018-07-20 DIAGNOSIS — E782 Mixed hyperlipidemia: Secondary | ICD-10-CM | POA: Diagnosis not present

## 2018-07-20 DIAGNOSIS — K1379 Other lesions of oral mucosa: Secondary | ICD-10-CM | POA: Diagnosis not present

## 2018-07-20 DIAGNOSIS — M5430 Sciatica, unspecified side: Secondary | ICD-10-CM | POA: Diagnosis not present

## 2018-07-20 DIAGNOSIS — J45909 Unspecified asthma, uncomplicated: Secondary | ICD-10-CM | POA: Diagnosis not present

## 2018-07-20 DIAGNOSIS — K219 Gastro-esophageal reflux disease without esophagitis: Secondary | ICD-10-CM | POA: Diagnosis not present

## 2018-07-20 DIAGNOSIS — E1169 Type 2 diabetes mellitus with other specified complication: Secondary | ICD-10-CM | POA: Diagnosis not present

## 2018-07-20 DIAGNOSIS — M79606 Pain in leg, unspecified: Secondary | ICD-10-CM | POA: Diagnosis not present

## 2018-07-20 DIAGNOSIS — I10 Essential (primary) hypertension: Secondary | ICD-10-CM | POA: Diagnosis not present

## 2018-07-22 DIAGNOSIS — D225 Melanocytic nevi of trunk: Secondary | ICD-10-CM | POA: Diagnosis not present

## 2018-07-22 DIAGNOSIS — Z1283 Encounter for screening for malignant neoplasm of skin: Secondary | ICD-10-CM | POA: Diagnosis not present

## 2018-07-22 DIAGNOSIS — Z85828 Personal history of other malignant neoplasm of skin: Secondary | ICD-10-CM | POA: Diagnosis not present

## 2018-07-22 DIAGNOSIS — Z08 Encounter for follow-up examination after completed treatment for malignant neoplasm: Secondary | ICD-10-CM | POA: Diagnosis not present

## 2018-07-25 ENCOUNTER — Other Ambulatory Visit: Payer: Self-pay | Admitting: Adult Health

## 2018-07-28 DIAGNOSIS — R7301 Impaired fasting glucose: Secondary | ICD-10-CM | POA: Diagnosis not present

## 2018-07-28 DIAGNOSIS — I10 Essential (primary) hypertension: Secondary | ICD-10-CM | POA: Diagnosis not present

## 2018-07-28 DIAGNOSIS — K219 Gastro-esophageal reflux disease without esophagitis: Secondary | ICD-10-CM | POA: Diagnosis not present

## 2018-07-28 DIAGNOSIS — J441 Chronic obstructive pulmonary disease with (acute) exacerbation: Secondary | ICD-10-CM | POA: Diagnosis not present

## 2018-07-28 DIAGNOSIS — E119 Type 2 diabetes mellitus without complications: Secondary | ICD-10-CM | POA: Diagnosis not present

## 2018-07-28 DIAGNOSIS — E039 Hypothyroidism, unspecified: Secondary | ICD-10-CM | POA: Diagnosis not present

## 2018-07-28 DIAGNOSIS — M797 Fibromyalgia: Secondary | ICD-10-CM | POA: Diagnosis not present

## 2018-07-28 DIAGNOSIS — G629 Polyneuropathy, unspecified: Secondary | ICD-10-CM | POA: Diagnosis not present

## 2018-07-28 DIAGNOSIS — E785 Hyperlipidemia, unspecified: Secondary | ICD-10-CM | POA: Diagnosis not present

## 2018-08-10 ENCOUNTER — Encounter: Payer: Self-pay | Admitting: Adult Health

## 2018-08-10 DIAGNOSIS — Z1231 Encounter for screening mammogram for malignant neoplasm of breast: Secondary | ICD-10-CM | POA: Diagnosis not present

## 2018-08-28 DIAGNOSIS — I10 Essential (primary) hypertension: Secondary | ICD-10-CM | POA: Diagnosis not present

## 2018-08-28 DIAGNOSIS — E782 Mixed hyperlipidemia: Secondary | ICD-10-CM | POA: Diagnosis not present

## 2018-08-28 DIAGNOSIS — E039 Hypothyroidism, unspecified: Secondary | ICD-10-CM | POA: Diagnosis not present

## 2018-08-28 DIAGNOSIS — E1169 Type 2 diabetes mellitus with other specified complication: Secondary | ICD-10-CM | POA: Diagnosis not present

## 2018-08-31 DIAGNOSIS — I10 Essential (primary) hypertension: Secondary | ICD-10-CM | POA: Diagnosis not present

## 2018-08-31 DIAGNOSIS — F419 Anxiety disorder, unspecified: Secondary | ICD-10-CM | POA: Diagnosis not present

## 2018-08-31 DIAGNOSIS — K1379 Other lesions of oral mucosa: Secondary | ICD-10-CM | POA: Diagnosis not present

## 2018-08-31 DIAGNOSIS — E039 Hypothyroidism, unspecified: Secondary | ICD-10-CM | POA: Diagnosis not present

## 2018-08-31 DIAGNOSIS — E782 Mixed hyperlipidemia: Secondary | ICD-10-CM | POA: Diagnosis not present

## 2018-08-31 DIAGNOSIS — Z6829 Body mass index (BMI) 29.0-29.9, adult: Secondary | ICD-10-CM | POA: Diagnosis not present

## 2018-08-31 DIAGNOSIS — F39 Unspecified mood [affective] disorder: Secondary | ICD-10-CM | POA: Diagnosis not present

## 2018-08-31 DIAGNOSIS — E1169 Type 2 diabetes mellitus with other specified complication: Secondary | ICD-10-CM | POA: Diagnosis not present

## 2018-08-31 DIAGNOSIS — E785 Hyperlipidemia, unspecified: Secondary | ICD-10-CM | POA: Diagnosis not present

## 2018-08-31 DIAGNOSIS — M859 Disorder of bone density and structure, unspecified: Secondary | ICD-10-CM | POA: Diagnosis not present

## 2018-08-31 DIAGNOSIS — E119 Type 2 diabetes mellitus without complications: Secondary | ICD-10-CM | POA: Diagnosis not present

## 2018-09-10 DIAGNOSIS — Z6828 Body mass index (BMI) 28.0-28.9, adult: Secondary | ICD-10-CM | POA: Diagnosis not present

## 2018-09-10 DIAGNOSIS — M545 Low back pain: Secondary | ICD-10-CM | POA: Diagnosis not present

## 2018-09-24 ENCOUNTER — Encounter: Payer: Self-pay | Admitting: Allergy

## 2018-09-24 ENCOUNTER — Ambulatory Visit: Payer: Medicare HMO | Admitting: Allergy

## 2018-09-24 VITALS — BP 130/72 | HR 74 | Temp 97.9°F | Resp 17 | Ht 62.5 in | Wt 168.2 lb

## 2018-09-24 DIAGNOSIS — K1379 Other lesions of oral mucosa: Secondary | ICD-10-CM

## 2018-09-24 DIAGNOSIS — T781XXD Other adverse food reactions, not elsewhere classified, subsequent encounter: Secondary | ICD-10-CM

## 2018-09-24 DIAGNOSIS — J45991 Cough variant asthma: Secondary | ICD-10-CM | POA: Diagnosis not present

## 2018-09-24 DIAGNOSIS — T781XXA Other adverse food reactions, not elsewhere classified, initial encounter: Secondary | ICD-10-CM | POA: Insufficient documentation

## 2018-09-24 DIAGNOSIS — J4530 Mild persistent asthma, uncomplicated: Secondary | ICD-10-CM | POA: Insufficient documentation

## 2018-09-24 NOTE — Assessment & Plan Note (Addendum)
Remote history of peanut/tree nut allergy. Patient used to tolerate recently with no issues. She is concerned about potentially developing allergy to them again.  Today's skin testing was negative to these foods but the positive control was negative as well questioning the validity of the results. Will get bloodwork to double check. Meanwhile continue avoidance of peanuts and tree nuts.  For mild symptoms you can take over the counter antihistamines such as Benadryl and monitor symptoms closely. If symptoms worsen or if you have severe symptoms including breathing issues, throat closure, significant swelling, whole body hives, severe diarrhea and vomiting, lightheadedness then seek immediate medical care.

## 2018-09-24 NOTE — Assessment & Plan Note (Addendum)
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 Concerned about peanuts/tree nuts as the last episode flared after eating those items. Denies any changes in dental products. Dentist gave her some type of liquid tonic for the gums which helps with the discomfort. She also had biopsy of the gum - in the process of obtaining records. Denies any recurrent infections, history of cancer, HIV or tobacco history use. Complains of dry mouth.  Discussed with patient that based on clinical history, I doubt the peanut/tree nuts are triggering these events and today's skin testing was negative to these foods but the positive control was negative as well questioning the validity of the results. Will get bloodwork to double check. Meanwhile continue avoidance of peanuts and tree nuts.  Concerned about ICS/LABA irritating her ulcers as 3 years ago is when she was started on Symbicort. Discussed using inhalers with spacer (currently does not use) and rinsing mouth afterwards. Demonstrated proper use.  Will get some bloodwork as below to rule out any other etiologies.  If symptoms do not improve and labwork is normal then will need further evaluation. No ulcers present today for swab.  Discussed avoiding spicy foods, carbonated beverages and citrus foods. Stop drinking lemonade for now.  Monitor symptoms.  Lab Orders     CBC With Differential     Sed Rate (ESR)     ANA w/Reflex     C-reactive protein     Comprehensive metabolic panel     G66 and Folate Panel     Fe+TIBC+Fer     IgE Nut Prof. w/Component Rflx

## 2018-09-24 NOTE — Assessment & Plan Note (Signed)
Diagnosed with asthma 3 years ago.  Main triggers include cold weather.  Currently on Symbicort 80 2 puffs twice a day and albuterol as needed with good benefit.  Today spirometry was normal.  Decrease Symbicort 80 to 1 puff twice a day and use with spacer.  Demonstrated proper use.  May use albuterol rescue inhaler 2 puffs or nebulizer every 4 to 6 hours as needed for shortness of breath, chest tightness, coughing, and wheezing. May use albuterol rescue inhaler 2 puffs 5 to 15 minutes prior to strenuous physical activities.  Monitor symptoms and if doing well will consider further stepping down therapy at next visit.

## 2018-09-24 NOTE — Patient Instructions (Addendum)
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 Concerned about peanuts/tree nuts as the last episode flared after eating those items. Denies any changes in dental products. Dentist gave her some type of liquid tonic for the gums which helps with the discomfort. She also had biopsy of the gum - in the process of obtaining records. Denies any recurrent infections, history of cancer, HIV or tobacco history use. Complains of dry mouth.  Discussed with patient that based on clinical history, I doubt the peanut/tree nuts are triggering these events and today's skin testing was negative to these foods but the positive control was negative as well questioning the validity of the results. Will get bloodwork to double check. Meanwhile continue avoidance of peanuts and tree nuts.  Concerned about ICS/LABA irritating her ulcers as 3 years ago is when she was started on Symbicort. Discussed using inhalers with spacer (currently does not use) and rinsing mouth afterwards. Demonstrated proper use.  Will get some bloodwork as below to rule out any other etiologies.  If symptoms do not improve and labwork is normal then will need further evaluation. No ulcers present today for swab.  Discussed avoiding spicy foods, carbonated beverages and citrus foods. Stop drinking lemonade for now.  Monitor symptoms.  Lab Orders     CBC With Differential     Sed Rate (ESR)     ANA w/Reflex     C-reactive protein     Comprehensive metabolic panel     S28 and Folate Panel     Fe+TIBC+Fer     IgE Nut Prof. w/Component Rflx  Adverse food reaction Remote history of peanut/tree nut allergy. Patient used to tolerate recently with no issues. She is concerned about potentially developing allergy to them again.  Today's skin testing was negative to these foods but the positive control was negative as well questioning the validity of the results. Will get bloodwork to double check. Meanwhile continue  avoidance of peanuts and tree nuts.  For mild symptoms you can take over the counter antihistamines such as Benadryl and monitor symptoms closely. If symptoms worsen or if you have severe symptoms including breathing issues, throat closure, significant swelling, whole body hives, severe diarrhea and vomiting, lightheadedness then seek immediate medical care.  Mild persistent asthma without complication Diagnosed with asthma 3 years ago.  Main triggers include cold weather.  Currently on Symbicort 80 2 puffs twice a day and albuterol as needed with good benefit.  Today spirometry was normal.  Decrease Symbicort 80 to 1 puff twice a day and use with spacer.  Demonstrated proper use.  May use albuterol rescue inhaler 2 puffs or nebulizer every 4 to 6 hours as needed for shortness of breath, chest tightness, coughing, and wheezing. May use albuterol rescue inhaler 2 puffs 5 to 15 minutes prior to strenuous physical activities.  Monitor symptoms and if doing well will consider further stepping down therapy at next visit.  Return in about 3 months (around 12/25/2018).

## 2018-09-24 NOTE — Progress Notes (Signed)
New Patient Note  RE: [name] MRN: [id number] DOB: Jun 25, 1941 Date of Office Visit: 09/24/2018  Referring provider: [name], MD Primary care provider: [name], MD  Chief Complaint: Mouth Lesions  History of Present Illness: I had the pleasure of seeing [name] for initial evaluation at the [facility] on 09/24/2018. She is a 77 y.o. female, who is referred here by [name], MD for the evaluation of mouth sores.   Mouth sores: Patient has been having issues with mouth sores for the past 3 years. They come and go. Sometimes they can stay up to a few months at a time. They usually occur on the inside of the lower lip area only and describes them as painful.  Patient has been to her dentist and was given various oral solutions to help. The one that seems to work the best is a "tooth and gums tonic" which seems to numb the area.   Patient takes care of demented husband and her dentist thought it was due to her stress. She also takes L-lysine 550m with unknown benefit.Trigger foods include spicy foods, tomato based foods, citrus fruits, and carbonated beverages. Denies any changes to her dental products. She does complain of dry mouth.   She does drink tea, lemonade, water and coffee on a daily basis. The last episode occurred this weekend after she had various tree nuts and questions if that's what triggering these episodes. Apparently she had peanut/tree nut allergy in the past. Not sure of reaction but had positive skin testing per patient report.    Patient is not sure what kind of work up she had to date but does remember having a biopsy which apparently showed precancerous cells - in process of obtaining records.   Patient denies history of multiple infections including sinus infection, pneumonia, ear infections, GI infections/diarrhea, skin infections/abscesses. Patient has no history of opportunistic infections including fungal  infections, viral infections.   Patient labs show immunoglobulin levels: never checked. Patient reports 1 antibiotic use in the last 12 months and 0 hospital admissions. Patient does not have any secondary causes of immunodeficiency including chronic steroid use, diabetes mellitus, protein losing enteropathy, renal or hepatic dysfunction, history of cancer or irradiation or history of HIV, hepatitis B or C.  Patient is up to date with mammogram, colonoscopy, pap smears.   Assessment and Plan: [name] a 77y.o. 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 Concerned about peanuts/tree nuts as the last episode flared after eating those items. Denies any changes in dental products. Dentist gave her some type of liquid tonic for the gums which helps with the discomfort. She also had biopsy of the gum - in the process of obtaining records. Denies any recurrent infections, history of cancer, HIV or tobacco history use. Complains of dry mouth.  Discussed with patient that based on clinical history, I doubt the peanut/tree nuts are triggering these events and today's skin testing was negative to these foods but the positive control was negative as well questioning the validity of the results. Will get bloodwork to double check. Meanwhile continue avoidance of peanuts and tree nuts.  Concerned about ICS/LABA irritating her ulcers as 3 years ago is when she was started on Symbicort. Discussed using inhalers with spacer (currently does not use) and rinsing mouth afterwards. Demonstrated proper use.  Will get some bloodwork as below to rule out any other etiologies.  If symptoms  do not improve and labwork is normal then will need further evaluation. No ulcers present today for swab.  Discussed avoiding spicy foods, carbonated beverages and citrus foods. Stop drinking lemonade for now.  Monitor symptoms.  Lab Orders     CBC With Differential     Sed Rate (ESR)      ANA w/Reflex     C-reactive protein     Comprehensive metabolic panel     S17 and Folate Panel     Fe+TIBC+Fer     IgE Nut Prof. w/Component Rflx  Adverse food reaction Remote history of peanut/tree nut allergy. Patient used to tolerate recently with no issues. She is concerned about potentially developing allergy to them again.  Today's skin testing was negative to these foods but the positive control was negative as well questioning the validity of the results. Will get bloodwork to double check. Meanwhile continue avoidance of peanuts and tree nuts.  For mild symptoms you can take over the counter antihistamines such as Benadryl and monitor symptoms closely. If symptoms worsen or if you have severe symptoms including breathing issues, throat closure, significant swelling, whole body hives, severe diarrhea and vomiting, lightheadedness then seek immediate medical care.  Mild persistent asthma without complication Diagnosed with asthma 3 years ago.  Main triggers include cold weather.  Currently on Symbicort 80 2 puffs twice a day and albuterol as needed with good benefit.  Today spirometry was normal.  Decrease Symbicort 80 to 1 puff twice a day and use with spacer.  Demonstrated proper use.  May use albuterol rescue inhaler 2 puffs or nebulizer every 4 to 6 hours as needed for shortness of breath, chest tightness, coughing, and wheezing. May use albuterol rescue inhaler 2 puffs 5 to 15 minutes prior to strenuous physical activities.  Monitor symptoms and if doing well will consider further stepping down therapy at next visit.  Return in about 3 months (around 12/25/2018).  No orders of the defined types were placed in this encounter.  Other allergy screening: Asthma: ? yes She reports symptoms of chest tightness, shortness of breath for 3 years. Current medications include Symbicort 80 2 puffs twice a day and albuterol prn which help. She reports not using aerochamber with asthma  inhalers. She tried the following inhalers: none. Main asthma triggers are cold. In the last month, frequency of asthma symptoms: 0x/week. Frequency of nocturnal symptoms: 0x/month. Frequency of SABA use: 0x/week. Interference with physical activity: 0. Sleep is undisturbed. In the last 12 months, emergency room visits/urgent care visits/doctor office visits or hospitalizations due to asthma: 0. In the last 12 months, oral steroids courses: yes. Lifetime history of hospitalization for asthma: 0. Prior intubations: no. History of pneumonia: no. She was evaluated by pulmonologist in the past. Smoking exposure: denies. Up to date with flu vaccine: yes. Up to date with pneumonia vaccine: yes.   Rhino conjunctivitis: no Food allergy: no Medication allergy: yes  Augmentin - itching Ace inhibitors - coughing Levofloxacin - itching, hives Hymenoptera allergy: no Urticaria: no Eczema:no History of recurrent infections suggestive of immunodeficency: no  Diagnostics: Spirometry:  Tracings reviewed. Her effort: Good reproducible efforts. FVC: 2.26 L FEV1: 1.82 L, 93 % predicted FEV1/FVC ratio: 81 % Interpretation: Spirometry consistent with normal pattern.  Please see scanned spirometry results for details.  Skin Testing: Select foods. Negative test to: Peanuts, tree nuts and tomato.  Positive control was negative as well questioning the validity of these results. Results discussed with patient/family. Food Adult Perc - 09/24/18  1100    Time Antigen Placed  1139    Allergen Manufacturer  Greer    Location  Back    Number of allergen test  11     Control-buffer 50% Glycerol  Negative    Control-Histamine 1 mg/ml  Negative    1. Peanut  Negative    10. Cashew  Negative    11. Pecan Food  Negative    12. [place]  Negative    13. Almond  Negative    14. Hazelnut  Negative    15. [place] nut  Negative    17. Pistachio  Negative    42. Tomato  Negative       Past Medical  History: Patient Active Problem List   Diagnosis Date Noted  . 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  . Adverse food reaction 09/24/2018  . Mild persistent asthma without complication 41/28/7867  . Dyspnea on exertion 01/24/2017  . Cough variant asthma  vs UACS 01/24/2017  . High risk medication use 09/25/2016  . Varicose veins of lower extremities with other complications 67/20/9470  . Varicose veins 02/15/2014  . Vertigo 11/23/2013  . Pain in limb 10/04/2013  . Hip bursitis 02/11/2013  . Constipation 02/16/2009  . RECTAL BLEEDING 02/16/2009  . HIGH BLOOD PRESSURE 01/29/2008   Past Medical History:  Diagnosis Date  . Anemia   . Anxiety   . Arthritis   . Asthma   . Constipation   . GERD (gastroesophageal reflux disease)   . High blood pressure   . Hypothyroidism   . Seasonal allergies   . Thyroid disease   . Varicose veins   . Vertigo 11/23/2013   Past Surgical History: Past Surgical History:  Procedure Laterality Date  . APPENDECTOMY    . CATARACT EXTRACTION W/PHACO Left 01/27/2017   Procedure: CATARACT EXTRACTION PHACO AND INTRAOCULAR LENS PLACEMENT (IOC);  Surgeon: Tonny Branch, MD;  Location: [facility];  Service: Ophthalmology;  Laterality: Left;  CDE: 29.90  . CATARACT EXTRACTION W/PHACO Right 02/24/2017   Procedure: CATARACT EXTRACTION PHACO AND INTRAOCULAR LENS PLACEMENT (IOC);  Surgeon: Tonny Branch, MD;  Location: [facility];  Service: Ophthalmology;  Laterality: Right;  CDE: 8.57  . CHOLECYSTECTOMY    . COLONOSCOPY N/A 10/01/2016   Procedure: COLONOSCOPY;  Surgeon: Daneil Dolin, MD;  Location: [facility];  Service: Endoscopy;  Laterality: N/A;  815   . FLEXOR TENDON REPAIR Right 11/06/2017   Procedure: RIGHT WRIST FLEXOR TENDON REPAIRAND STT [place];  Surgeon: Leanora Cover, MD;  Location: [facility];  Service: Orthopedics;  Laterality: Right;  . TUBAL LIGATION     Medication List:  Current Outpatient Medications  Medication Sig Dispense Refill   . albuterol (VENTOLIN HFA) 108 (90 Base) MCG/ACT inhaler Inhale 2 puffs into the lungs every 6 (six) hours as needed for wheezing or shortness of breath.    . ALPRAZolam (XANAX) 0.5 MG tablet Take 0.5 mg by mouth at bedtime.     Marland Kitchen amLODipine (NORVASC) 2.5 MG tablet Take 2.5 mg by mouth every evening.     Marland Kitchen aspirin EC 81 MG tablet Take 81 mg by mouth daily.    Marland Kitchen buPROPion (WELLBUTRIN SR) 100 MG 12 hr tablet     . docusate sodium (COLACE) 50 MG capsule Take 100 mg by mouth at bedtime.    . famotidine (PEPCID) 20 MG tablet TAKE (1) TABLET DAILY AT BEDTIME. 30 tablet 2  . gabapentin (NEURONTIN) 100 MG capsule Take 1 capsule (100 mg total)  by mouth every 8 (eight) hours as needed. (Patient taking differently: Take 100 mg by mouth 3 (three) times daily as needed (for pain). ) 60 capsule 3  . hydrochlorothiazide (HYDRODIURIL) 25 MG tablet Take 25 mg by mouth daily.     Marland Kitchen levothyroxine (SYNTHROID, LEVOTHROID) 125 MCG tablet Take 67.5 mcg by mouth daily before breakfast.     . losartan (COZAAR) 50 MG tablet Take 25 mg by mouth daily.     . Magnesium 400 MG TABS Take 200 mg by mouth at bedtime.     . Multiple Vitamin (MULTIVITAMIN) tablet Take 1 tablet by mouth daily.    . pantoprazole (PROTONIX) 40 MG tablet TAKE 1 TABLET BY MOUTH ONCE DAILY 30 TO 60 MINUTES BEFORE FIRST MEAL OF THE DAY. 90 tablet 0  . pravastatin (PRAVACHOL) 10 MG tablet     . raloxifene (EVISTA) 60 MG tablet TAKE 1 TABLET BY MOUTH EVERY MORNING. (Patient taking differently: Take 30 mg by mouth every other day. ) 30 tablet 6  . SYMBICORT 80-4.5 MCG/ACT inhaler INHALE 2 PUFFS INTO THE LUNGS TWICE DAILY. 10.2 g 0  . traMADol (ULTRAM) 50 MG tablet Take 1 tablet by mouth daily as needed for moderate pain or severe pain.      No current facility-administered medications for this visit.    Allergies: Allergies  Allergen Reactions  . Amoxicillin-Pot Clavulanate Nausea And Vomiting, Other (See Comments), Itching and Nausea Only    Has  patient had a PCN reaction causing immediate rash, facial/tongue/throat swelling, SOB or lightheadedness with hypotension: No Has patient had a PCN reaction causing severe rash involving mucus membranes or skin necrosis: No Has patient had a PCN reaction that required hospitalization: No Has patient had a PCN reaction occurring within the last 10 years: No If all of the above answers are "NO", then may proceed with Cephalosporin use. Has patient had a PCN reaction causing immediate rash, facial/tongue/throat swelling, SOB or lightheadedness with hypotension: No Has patient had a PCN reaction causing severe rash involving mucus membranes or skin necrosis: No Has patient had a PCN reaction that required hospitalization: No Has patient had a PCN reaction occurring within the last 10 years: No If all of the above answers are "NO", then may proceed with Cephalosporin use.   . Ace Inhibitors Cough  . Entex La Itching  . Levofloxacin Hives and Itching   Social History: Social History   Socioeconomic History  . Marital status: Married    Spouse name: Not on file  . Number of children: Not on file  . Years of education: Not on file  . Highest education level: Not on file  Occupational History  . Not on file  Social Needs  . Financial resource strain: Not on file  . Food insecurity:    Worry: Not on file    Inability: Not on file  . Transportation needs:    Medical: Not on file    Non-medical: Not on file  Tobacco Use  . Smoking status: Former Smoker    Packs/day: 0.25    Years: 5.00    Pack years: 1.25    Types: Cigarettes    Last attempt to quit: 12/02/1997    Years since quitting: 20.8  . Smokeless tobacco: Never Used  Substance and Sexual Activity  . Alcohol use: No    Alcohol/week: 1.0 standard drinks    Types: 1 Glasses of wine per week  . Drug use: No  . Sexual activity: Yes  Birth control/protection: Surgical    Comment: tubal  Lifestyle  . Physical activity:     Days per week: Not on file    Minutes per session: Not on file  . Stress: Not on file  Relationships  . Social connections:    Talks on phone: Not on file    Gets together: Not on file    Attends religious service: Not on file    Active member of club or organization: Not on file    Attends meetings of clubs or organizations: Not on file    Relationship status: Not on file  Other Topics Concern  . Not on file  Social History Narrative  . Not on file   Lives in a 76 year old house. Smoking: denies Occupation: housewife  Environmental History: Water Damage/mildew in the house: no Carpet in the family room: yes Carpet in the bedroom: no Heating: electric Cooling: central Pet: no  Family History: Family History  Problem Relation Age of Onset  . Hypertension Mother   . Varicose Veins Father   . Heart attack Father   . Heart disease Father   . Hypertension Brother   . COPD Brother    Problem                               Relation Asthma                                   No  Eczema                                No  Food allergy                          No  Allergic rhino conjunctivitis     No   Review of Systems  Constitutional: Negative for appetite change, chills, fever and unexpected weight change.  HENT: Negative for congestion and rhinorrhea.   Eyes: Negative for itching.  Respiratory: Negative for cough, chest tightness, shortness of breath and wheezing.   Cardiovascular: Negative for chest pain.  Gastrointestinal: Negative for abdominal pain.  Genitourinary: Negative for difficulty urinating.  Skin: Negative for rash.  Allergic/Immunologic: Negative for environmental allergies and food allergies.  Neurological: Negative for headaches.   Objective: BP 130/72 (BP Location: Left Arm, Patient Position: Sitting, Cuff Size: Normal)   Pulse 74   Temp 97.9 F (36.6 C) (Oral)   Resp 17   Ht 5' 2.5" (1.588 m)   Wt 168 lb 3.2 oz (76.3 kg)   SpO2 93%   BMI 30.27  kg/m  Body mass index is 30.27 kg/m. Physical Exam  Constitutional: She is oriented to person, place, and time. She appears well-developed and well-nourished.  HENT:  Head: Normocephalic and atraumatic.  Right Ear: External ear normal.  Left Ear: External ear normal.  Nose: Nose normal.  Mouth/Throat: Oropharynx is clear and moist.  No mouth ulcers noted.  Eyes: Conjunctivae and EOM are normal.  Neck: Neck supple.  Cardiovascular: Normal rate, regular rhythm and normal heart sounds. Exam reveals no gallop and no friction rub.  No murmur heard. Pulmonary/Chest: Effort normal and breath sounds normal. She has no wheezes. She has no rales.  Abdominal: Soft.  Lymphadenopathy:    She has  no cervical adenopathy.  Neurological: She is alert and oriented to person, place, and time.  Skin: Skin is warm. No rash noted.  Psychiatric: She has a normal mood and affect. Her behavior is normal.  Nursing note and vitals reviewed.  The plan was reviewed with the patient/family, and all questions/concerned were addressed.  It was my pleasure to see [name] today and participate in her care. Please feel free to contact me with any questions or concerns.  Sincerely,  Rexene Alberts, DO Allergy & Immunology  [facility] office: [phone] [place]

## 2018-09-28 LAB — CBC WITH DIFFERENTIAL
BASOS: 1 %
Basophils Absolute: 0.1 10*3/uL (ref 0.0–0.2)
EOS (ABSOLUTE): 0.2 10*3/uL (ref 0.0–0.4)
EOS: 3 %
HEMATOCRIT: 35.3 % (ref 34.0–46.6)
Hemoglobin: 11.7 g/dL (ref 11.1–15.9)
Immature Grans (Abs): 0 10*3/uL (ref 0.0–0.1)
Immature Granulocytes: 0 %
LYMPHS ABS: 1.9 10*3/uL (ref 0.7–3.1)
Lymphs: 26 %
MCH: 30.4 pg (ref 26.6–33.0)
MCHC: 33.1 g/dL (ref 31.5–35.7)
MCV: 92 fL (ref 79–97)
MONOS ABS: 0.7 10*3/uL (ref 0.1–0.9)
Monocytes: 9 %
Neutrophils Absolute: 4.3 10*3/uL (ref 1.4–7.0)
Neutrophils: 61 %
RBC: 3.85 x10E6/uL (ref 3.77–5.28)
RDW: 13.8 % (ref 12.3–15.4)
WBC: 7.2 10*3/uL (ref 3.4–10.8)

## 2018-09-28 LAB — SEDIMENTATION RATE: SED RATE: 12 mm/h (ref 0–40)

## 2018-09-28 LAB — IGE NUT PROF. W/COMPONENT RFLX
Brazil Nut IgE: 0.32 kU/L — AB
F013-IGE PEANUT: 2.2 kU/L — AB
F020-IgE Almond: 3.41 kU/L — AB
F202-IgE Cashew Nut: <0.1 kU/L
F203-IGE PISTACHIO NUT: 0.44 kU/L — AB
F345-IGE MACADAMIA NUT: 0.97 kU/L — AB
Hazelnut (Filbert) IgE: 0.62 kU/L — AB
Pecan Nut IgE: <0.1 kU/L
WALNUT IGE: 1.44 kU/L — AB

## 2018-09-28 LAB — COMPREHENSIVE METABOLIC PANEL
A/G RATIO: 2 (ref 1.2–2.2)
ALBUMIN: 4.3 g/dL (ref 3.5–4.8)
ALT: 21 IU/L (ref 0–32)
AST: 25 IU/L (ref 0–40)
Alkaline Phosphatase: 69 IU/L (ref 39–117)
BILIRUBIN TOTAL: 0.4 mg/dL (ref 0.0–1.2)
BUN/Creatinine Ratio: 16 (ref 12–28)
BUN: 13 mg/dL (ref 8–27)
CO2: 26 mmol/L (ref 20–29)
CREATININE: 0.8 mg/dL (ref 0.57–1.00)
Calcium: 9.4 mg/dL (ref 8.7–10.3)
Chloride: 100 mmol/L (ref 96–106)
GFR calc Af Amer: 83 mL/min/{1.73_m2} (ref >59)
GFR, EST NON AFRICAN AMERICAN: 72 mL/min/{1.73_m2} (ref >59)
GLOBULIN, TOTAL: 2.2 g/dL (ref 1.5–4.5)
Glucose: 94 mg/dL (ref 65–99)
Potassium: 3.2 mmol/L — ABNORMAL LOW (ref 3.5–5.2)
Sodium: 141 mmol/L (ref 134–144)
Total Protein: 6.5 g/dL (ref 6.0–8.5)

## 2018-09-28 LAB — PANEL 604726
Cor A 1 IgE: 0.15 kU/L — AB
Cor A 8 IgE: <0.1 kU/L

## 2018-09-28 LAB — IRON,TIBC AND FERRITIN PANEL
FERRITIN: 155 ng/mL — AB (ref 15–150)
IRON SATURATION: 37 % (ref 15–55)
Iron: 100 ug/dL (ref 27–139)
TIBC: 270 ug/dL (ref 250–450)
UIBC: 170 ug/dL (ref 118–369)

## 2018-09-28 LAB — PANEL 604350

## 2018-09-28 LAB — ALLERGEN COMPONENT COMMENTS

## 2018-09-28 LAB — PEANUT COMPONENTS
F352-IgE Ara h 8: <0.1 kU/L
F422-IgE Ara h 1: <0.1 kU/L
F423-IgE Ara h 2: <0.1 kU/L
F427-IgE Ara h 9: 0.43 kU/L — AB

## 2018-09-28 LAB — B12 AND FOLATE PANEL
Folate: >20 ng/mL (ref >3.0)
Vitamin B-12: 730 pg/mL (ref 232–1245)

## 2018-09-28 LAB — PANEL 604721
Jug R 1 IgE: <0.1 kU/L
Jug R 3 IgE: 0.16 kU/L — AB

## 2018-09-28 LAB — ANA W/REFLEX: ANA: NEGATIVE

## 2018-09-28 LAB — C-REACTIVE PROTEIN: CRP: 7 mg/L (ref 0–10)

## 2018-09-30 ENCOUNTER — Encounter: Payer: Self-pay | Admitting: Allergy

## 2018-10-07 ENCOUNTER — Telehealth: Payer: Self-pay

## 2018-10-07 NOTE — Telephone Encounter (Signed)
Patient called and wanted her lab results. Patient made aware of lab results.

## 2018-10-13 DIAGNOSIS — E782 Mixed hyperlipidemia: Secondary | ICD-10-CM | POA: Diagnosis not present

## 2018-10-13 DIAGNOSIS — E039 Hypothyroidism, unspecified: Secondary | ICD-10-CM | POA: Diagnosis not present

## 2018-10-13 DIAGNOSIS — E1169 Type 2 diabetes mellitus with other specified complication: Secondary | ICD-10-CM | POA: Diagnosis not present

## 2018-10-13 DIAGNOSIS — I10 Essential (primary) hypertension: Secondary | ICD-10-CM | POA: Diagnosis not present

## 2018-11-02 DIAGNOSIS — I10 Essential (primary) hypertension: Secondary | ICD-10-CM | POA: Diagnosis not present

## 2018-11-02 DIAGNOSIS — E782 Mixed hyperlipidemia: Secondary | ICD-10-CM | POA: Diagnosis not present

## 2018-12-03 DIAGNOSIS — J0100 Acute maxillary sinusitis, unspecified: Secondary | ICD-10-CM | POA: Diagnosis not present

## 2018-12-23 ENCOUNTER — Ambulatory Visit: Payer: Medicare HMO | Admitting: Allergy & Immunology

## 2018-12-23 DIAGNOSIS — F411 Generalized anxiety disorder: Secondary | ICD-10-CM | POA: Diagnosis not present

## 2018-12-23 DIAGNOSIS — K1370 Unspecified lesions of oral mucosa: Secondary | ICD-10-CM | POA: Diagnosis not present

## 2018-12-29 DIAGNOSIS — L97512 Non-pressure chronic ulcer of other part of right foot with fat layer exposed: Secondary | ICD-10-CM | POA: Diagnosis not present

## 2018-12-29 DIAGNOSIS — M21611 Bunion of right foot: Secondary | ICD-10-CM | POA: Diagnosis not present

## 2018-12-29 DIAGNOSIS — M79671 Pain in right foot: Secondary | ICD-10-CM | POA: Diagnosis not present

## 2018-12-29 DIAGNOSIS — M25571 Pain in right ankle and joints of right foot: Secondary | ICD-10-CM | POA: Diagnosis not present

## 2018-12-30 ENCOUNTER — Ambulatory Visit: Payer: Medicare HMO | Admitting: Allergy & Immunology

## 2018-12-30 ENCOUNTER — Encounter: Payer: Self-pay | Admitting: Allergy & Immunology

## 2018-12-30 VITALS — BP 112/70 | HR 68 | Resp 18 | Ht 62.0 in | Wt 164.0 lb

## 2018-12-30 DIAGNOSIS — J4530 Mild persistent asthma, uncomplicated: Secondary | ICD-10-CM

## 2018-12-30 DIAGNOSIS — T781XXD Other adverse food reactions, not elsewhere classified, subsequent encounter: Secondary | ICD-10-CM

## 2018-12-30 DIAGNOSIS — K1379 Other lesions of oral mucosa: Secondary | ICD-10-CM

## 2018-12-30 MED ORDER — EPINEPHRINE 0.3 MG/0.3ML IJ SOAJ: 0.3000 mg | Freq: Once | INTRAMUSCULAR | 1 refills | Status: DC | PRN

## 2018-12-30 NOTE — Progress Notes (Signed)
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 Pfefferkorn presents for a follow-up visit.  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  She is now on Valtrex once daily, although she is having some breakouts.  Therefore, I recommended that she increase to 1 Valtrex twice daily to see if this provides enough detection to keep her lesions from emerging.  She has had marked increase in her quality of life without having to worry about these ulcers.  We did provide an epinephrine autoinjector just in case her reactions to peanuts and tree nuts progress, although her history prior to testing shows that she did tolerate peanuts and tree nuts intermittently.  In any case, she is going to avoid them at this point.  Her asthma seems to be well controlled with Symbicort 1 puff twice daily.  We agreed to continue that for now.  We will do lung testing at the next visit to see how this looks, but at this point clinically she seems to be doing well.    Plan/Recommendations:   1. Recurrent mouth ulceration - I would recommend increasing your Valtrex to one tablet twice daily to see if this does a better job of suppressing your ulcers.  - Hopefully this will prevent recurrence of these ulcers.   2. Adverse food reaction (peanuts and tree nuts) - Continue to avoid peanuts and tree nuts. - EpiPen is up to date.   3. Mild persistent asthma without complication - We did not do lung testing today. - Since you are doing well on the Symbicort, we will continue with this dosing regimen.  - Spacer use reviewed. - Daily controller medication(s): Symbicort 80/4.90mcg two puffs twice daily with spacer - Prior to physical activity: ProAir 2 puffs 10-15  minutes before physical activity. - Rescue medications: ProAir 4 puffs every 4-6 hours as needed - Changes during respiratory infections or worsening symptoms: Increase Symbicort 80/4.5 to 2 puffs twice daily for TWO WEEKS. - Asthma control goals:  * Full participation in all desired activities (may need albuterol before activity) * Albuterol use two time or less a week on average (not counting use with activity) * Cough interfering with sleep two time or less a month * Oral steroids no more than once a year * No hospitalizations  4. Return in about 6 months (around 06/30/2019).   Subjective:   IRISHA GRANDMAISON is a 78 y.o. female presenting today for follow up of  Chief Complaint  Patient presents with  . Mouth Lesions    just here for follow up. ulcers are no better since last visit. Sees [facility] for her asthma.    KATALINA MAGRI has a history of the following: Patient Active Problem List   Diagnosis Date Noted  . 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  . Adverse food reaction 09/24/2018  . Mild persistent asthma without complication 41/66/0630  . Dyspnea on exertion 01/24/2017  . Cough variant asthma  vs UACS 01/24/2017  . High risk medication use 09/25/2016  . Varicose veins of lower extremities with other complications 16/12/930  . Varicose veins 02/15/2014  . Vertigo 11/23/2013  . Pain in limb 10/04/2013  . Hip bursitis 02/11/2013  . Constipation 02/16/2009  . RECTAL BLEEDING 02/16/2009  .  HIGH BLOOD PRESSURE 01/29/2008    History obtained from: chart review and patient.  Darlin Drop Mansouri's Primary Care Provider is [name], MD.     Avelina is a 78 y.o. female presenting for a follow up visit.  She was last seen in October 2019 by Dr. Maudie Mercury.  At that time, she was evaluated for recurrent mouth ulcers.  She did send some lab work.  Her peanut component testing was positive only to a low IgE to Ara h 9.  Her walnut component testing was very low positive to Jug R  3 at 0.16.  Hazelnut component testing was positive only to Cor A  At 0.15.  Tree nut testing did show mildly elevated IgE's to walnut, peanut, and almond with lower levels to pistachio, macadamia nut, [place] nut, and hazelnut.  A CMP and iron studies were largely normal.  She also has a history of mild persistent asthma.  Her Symbicort was decreased to 1 puff twice daily.  Since the last visit, she has mostly done well. She was placed on Valtex with improvement in her ulcers. This was started by her dentist. Her PCP two weeks ago started one tablet once daily. She now feels that she has one emerging on her lower lip. Her quality of life has improved since the ulcers have resolved. She has never had the lesions swabbed.   Asthma/Respiratory Symptom History: She is currently on Symbicort 80/4.5 one puff twice daily. She started using the spacer following the next visit, but she has not felt that it has helped at all. In any case, Roselin's asthma has been well controlled. She has not required rescue medication, experienced nocturnal awakenings due to lower respiratory symptoms, nor have activities of daily living been limited. She has required no Emergency Department or Urgent Care visits for her asthma. She has required zero courses of systemic steroids for asthma exacerbations since the last visit. ACT score today is 23, indicating excellent asthma symptom control.   Food Allergy Symptom History: She continues to avoid peanuts and tree nuts. She did not know that she was allergic when the testing was done, but regardless she has not eaten any nuts whatsoever. She does not have an EpiPen and is clearly confused when I ask her about this.   Otherwise, there have been no changes to her past medical history, surgical history, family history, or social history.    Review of Systems: a 14-point review of systems is pertinent for what is mentioned in HPI.  Otherwise, all other systems were  negative.  Constitutional: negative other than that listed in the HPI Eyes: negative other than that listed in the HPI Ears, nose, mouth, throat, and face: negative other than that listed in the HPI Respiratory: negative other than that listed in the HPI Cardiovascular: negative other than that listed in the HPI Gastrointestinal: negative other than that listed in the HPI Genitourinary: negative other than that listed in the HPI Integument: negative other than that listed in the HPI Hematologic: negative other than that listed in the HPI Musculoskeletal: negative other than that listed in the HPI Neurological: negative other than that listed in the HPI Allergy/Immunologic: negative other than that listed in the HPI    Objective:   Blood pressure 112/70, pulse 68, resp. rate 18, height 5\' 2"  (1.575 m), weight 164 lb (74.4 kg), SpO2 97 %. Body mass index is 30 kg/m.   Physical Exam:  General: Alert, interactive, in no acute distress. Eyes: No  conjunctival injection bilaterally, no discharge on the right, no discharge on the left and no Horner-Trantas dots present. PERRL bilaterally. EOMI without pain. No photophobia.  Ears: Right TM pearly gray with normal light reflex, Left TM pearly gray with normal light reflex, Right TM intact without perforation and Left TM intact without perforation.  Nose/Throat: External nose within normal limits and septum midline. Turbinates edematous without discharge. Posterior oropharynx mildly erythematous without cobblestoning in the posterior oropharynx. Tonsils 2+ without exudates.  Tongue without thrush. She does have one isolated emerging ulcer on the inferior buccal surface.  Lungs: Clear to auscultation without wheezing, rhonchi or rales. No increased work of breathing. CV: Normal S1/S2. No murmurs. Capillary refill <2 seconds.  Skin: Warm and dry, without lesions or rashes. Neuro:   Grossly intact. No focal deficits appreciated. Responsive to  questions.  Diagnostic studies: none     Salvatore Marvel, MD  [facility]

## 2018-12-30 NOTE — Patient Instructions (Addendum)
1. Recurrent mouth ulceration - I would recommend increasing your Valtrex to one tablet twice daily to see if this does a better job of suppressing your ulcers.  - Hopefully this will prevent recurrence of these ulcers.   2. Adverse food reaction (peanuts and tree nuts) - Continue to avoid peanuts and tree nuts. - EpiPen is up to date.   3. Mild persistent asthma without complication - We did not do lung testing today. - Since you are doing well on the Symbicort, we will continue with this dosing regimen.  - Spacer use reviewed. - Daily controller medication(s): Symbicort 80/4.19mcg two puffs twice daily with spacer - Prior to physical activity: ProAir 2 puffs 10-15 minutes before physical activity. - Rescue medications: ProAir 4 puffs every 4-6 hours as needed - Changes during respiratory infections or worsening symptoms: Increase Symbicort 80/4.5 to 2 puffs twice daily for TWO WEEKS. - Asthma control goals:  * Full participation in all desired activities (may need albuterol before activity) * Albuterol use two time or less a week on average (not counting use with activity) * Cough interfering with sleep two time or less a month * Oral steroids no more than once a year * No hospitalizations  4. Return in about 6 months (around 06/30/2019).   Please inform us of any Emergency Department visits, hospitalizations, or changes in symptoms. Call us before going to the ED for breathing or allergy symptoms since we might be able to fit you in for a sick visit. Feel free to contact us anytime with any questions, problems, or concerns.  It was a pleasure to meet you and your family today! You are adorable!   Websites that have reliable patient information: 1. American Academy of Asthma, Allergy, and Immunology: www.aaaai.org 2. Food Allergy Research and Education (FARE): foodallergy.org 3. Mothers of Asthmatics: http://www.asthmacommunitynetwork.org 4. American College of Allergy, Asthma, and  Immunology: MonthlyElectricBill.co.uk   Make sure you are registered to vote! If you have moved or changed any of your contact information, you will need to get this updated before voting!    Voter ID laws are POSSIBLY going into effect for the General Election in November 2020! Be prepared! Check out [email or web address] for more details.

## 2018-12-31 ENCOUNTER — Telehealth: Payer: Self-pay | Admitting: Allergy & Immunology

## 2018-12-31 NOTE — Telephone Encounter (Signed)
Called patient advised that this is a good price for epipen. Patient verbalized understanding and will call pharmacy to fill it. Also patient wants training on device advised she could get training from pharmacy or she can come get training from [place] she states she will does as advised

## 2018-12-31 NOTE — Telephone Encounter (Signed)
Pt called because she went to the pharmary to pick it up and her copay would be $45.00 for the epi-pen and did not know if there was one cheaper. [phone].

## 2019-01-12 DIAGNOSIS — L97512 Non-pressure chronic ulcer of other part of right foot with fat layer exposed: Secondary | ICD-10-CM | POA: Diagnosis not present

## 2019-01-26 DIAGNOSIS — E785 Hyperlipidemia, unspecified: Secondary | ICD-10-CM | POA: Diagnosis not present

## 2019-01-26 DIAGNOSIS — D509 Iron deficiency anemia, unspecified: Secondary | ICD-10-CM | POA: Diagnosis not present

## 2019-01-26 DIAGNOSIS — E1169 Type 2 diabetes mellitus with other specified complication: Secondary | ICD-10-CM | POA: Diagnosis not present

## 2019-01-26 DIAGNOSIS — E782 Mixed hyperlipidemia: Secondary | ICD-10-CM | POA: Diagnosis not present

## 2019-01-26 DIAGNOSIS — I10 Essential (primary) hypertension: Secondary | ICD-10-CM | POA: Diagnosis not present

## 2019-01-26 DIAGNOSIS — M79675 Pain in left toe(s): Secondary | ICD-10-CM | POA: Diagnosis not present

## 2019-01-26 DIAGNOSIS — M79674 Pain in right toe(s): Secondary | ICD-10-CM | POA: Diagnosis not present

## 2019-01-26 DIAGNOSIS — E119 Type 2 diabetes mellitus without complications: Secondary | ICD-10-CM | POA: Diagnosis not present

## 2019-01-26 DIAGNOSIS — E039 Hypothyroidism, unspecified: Secondary | ICD-10-CM | POA: Diagnosis not present

## 2019-01-26 DIAGNOSIS — L600 Ingrowing nail: Secondary | ICD-10-CM | POA: Diagnosis not present

## 2019-01-29 DIAGNOSIS — M5430 Sciatica, unspecified side: Secondary | ICD-10-CM | POA: Diagnosis not present

## 2019-01-29 DIAGNOSIS — J449 Chronic obstructive pulmonary disease, unspecified: Secondary | ICD-10-CM | POA: Diagnosis not present

## 2019-01-29 DIAGNOSIS — J45909 Unspecified asthma, uncomplicated: Secondary | ICD-10-CM | POA: Diagnosis not present

## 2019-01-29 DIAGNOSIS — M79606 Pain in leg, unspecified: Secondary | ICD-10-CM | POA: Diagnosis not present

## 2019-01-29 DIAGNOSIS — E876 Hypokalemia: Secondary | ICD-10-CM | POA: Diagnosis not present

## 2019-01-29 DIAGNOSIS — K120 Recurrent oral aphthae: Secondary | ICD-10-CM | POA: Diagnosis not present

## 2019-01-29 DIAGNOSIS — E782 Mixed hyperlipidemia: Secondary | ICD-10-CM | POA: Diagnosis not present

## 2019-01-29 DIAGNOSIS — E1169 Type 2 diabetes mellitus with other specified complication: Secondary | ICD-10-CM | POA: Diagnosis not present

## 2019-01-29 DIAGNOSIS — E039 Hypothyroidism, unspecified: Secondary | ICD-10-CM | POA: Diagnosis not present

## 2019-01-29 DIAGNOSIS — I10 Essential (primary) hypertension: Secondary | ICD-10-CM | POA: Diagnosis not present

## 2019-04-21 DIAGNOSIS — I10 Essential (primary) hypertension: Secondary | ICD-10-CM | POA: Diagnosis not present

## 2019-04-21 DIAGNOSIS — E782 Mixed hyperlipidemia: Secondary | ICD-10-CM | POA: Diagnosis not present

## 2019-04-21 DIAGNOSIS — E1169 Type 2 diabetes mellitus with other specified complication: Secondary | ICD-10-CM | POA: Diagnosis not present

## 2019-04-21 DIAGNOSIS — E039 Hypothyroidism, unspecified: Secondary | ICD-10-CM | POA: Diagnosis not present

## 2019-05-06 DIAGNOSIS — E039 Hypothyroidism, unspecified: Secondary | ICD-10-CM | POA: Diagnosis not present

## 2019-05-06 DIAGNOSIS — E782 Mixed hyperlipidemia: Secondary | ICD-10-CM | POA: Diagnosis not present

## 2019-05-06 DIAGNOSIS — E1169 Type 2 diabetes mellitus with other specified complication: Secondary | ICD-10-CM | POA: Diagnosis not present

## 2019-05-06 DIAGNOSIS — I10 Essential (primary) hypertension: Secondary | ICD-10-CM | POA: Diagnosis not present

## 2019-05-18 DIAGNOSIS — L209 Atopic dermatitis, unspecified: Secondary | ICD-10-CM | POA: Diagnosis not present

## 2019-05-18 DIAGNOSIS — R21 Rash and other nonspecific skin eruption: Secondary | ICD-10-CM | POA: Diagnosis not present

## 2019-06-30 ENCOUNTER — Ambulatory Visit: Payer: Medicare HMO | Admitting: Allergy & Immunology

## 2019-06-30 DIAGNOSIS — Z01118 Encounter for examination of ears and hearing with other abnormal findings: Secondary | ICD-10-CM | POA: Diagnosis not present

## 2019-06-30 DIAGNOSIS — H903 Sensorineural hearing loss, bilateral: Secondary | ICD-10-CM | POA: Diagnosis not present

## 2019-07-07 DIAGNOSIS — C44319 Basal cell carcinoma of skin of other parts of face: Secondary | ICD-10-CM | POA: Diagnosis not present

## 2019-07-07 DIAGNOSIS — L84 Corns and callosities: Secondary | ICD-10-CM | POA: Diagnosis not present

## 2019-07-07 DIAGNOSIS — Z1283 Encounter for screening for malignant neoplasm of skin: Secondary | ICD-10-CM | POA: Diagnosis not present

## 2019-07-07 DIAGNOSIS — D225 Melanocytic nevi of trunk: Secondary | ICD-10-CM | POA: Diagnosis not present

## 2019-07-19 DIAGNOSIS — J019 Acute sinusitis, unspecified: Secondary | ICD-10-CM | POA: Diagnosis not present

## 2019-07-19 DIAGNOSIS — R21 Rash and other nonspecific skin eruption: Secondary | ICD-10-CM | POA: Diagnosis not present

## 2019-07-19 DIAGNOSIS — M79674 Pain in right toe(s): Secondary | ICD-10-CM | POA: Diagnosis not present

## 2019-07-23 DIAGNOSIS — E1169 Type 2 diabetes mellitus with other specified complication: Secondary | ICD-10-CM | POA: Diagnosis not present

## 2019-07-23 DIAGNOSIS — E782 Mixed hyperlipidemia: Secondary | ICD-10-CM | POA: Diagnosis not present

## 2019-07-23 DIAGNOSIS — E785 Hyperlipidemia, unspecified: Secondary | ICD-10-CM | POA: Diagnosis not present

## 2019-07-23 DIAGNOSIS — I10 Essential (primary) hypertension: Secondary | ICD-10-CM | POA: Diagnosis not present

## 2019-07-23 DIAGNOSIS — E119 Type 2 diabetes mellitus without complications: Secondary | ICD-10-CM | POA: Diagnosis not present

## 2019-07-23 DIAGNOSIS — E039 Hypothyroidism, unspecified: Secondary | ICD-10-CM | POA: Diagnosis not present

## 2019-07-25 DIAGNOSIS — Z20828 Contact with and (suspected) exposure to other viral communicable diseases: Secondary | ICD-10-CM | POA: Diagnosis not present

## 2019-07-25 DIAGNOSIS — B349 Viral infection, unspecified: Secondary | ICD-10-CM | POA: Diagnosis not present

## 2019-07-25 DIAGNOSIS — Z020 Encounter for examination for admission to educational institution: Secondary | ICD-10-CM | POA: Diagnosis not present

## 2019-07-29 DIAGNOSIS — J019 Acute sinusitis, unspecified: Secondary | ICD-10-CM | POA: Diagnosis not present

## 2019-08-02 DIAGNOSIS — E039 Hypothyroidism, unspecified: Secondary | ICD-10-CM | POA: Diagnosis not present

## 2019-08-02 DIAGNOSIS — E1169 Type 2 diabetes mellitus with other specified complication: Secondary | ICD-10-CM | POA: Diagnosis not present

## 2019-08-02 DIAGNOSIS — M5430 Sciatica, unspecified side: Secondary | ICD-10-CM | POA: Diagnosis not present

## 2019-08-02 DIAGNOSIS — K120 Recurrent oral aphthae: Secondary | ICD-10-CM | POA: Diagnosis not present

## 2019-08-02 DIAGNOSIS — E876 Hypokalemia: Secondary | ICD-10-CM | POA: Diagnosis not present

## 2019-08-02 DIAGNOSIS — E782 Mixed hyperlipidemia: Secondary | ICD-10-CM | POA: Diagnosis not present

## 2019-08-02 DIAGNOSIS — J45909 Unspecified asthma, uncomplicated: Secondary | ICD-10-CM | POA: Diagnosis not present

## 2019-08-02 DIAGNOSIS — I10 Essential (primary) hypertension: Secondary | ICD-10-CM | POA: Diagnosis not present

## 2019-08-02 DIAGNOSIS — Z0000 Encounter for general adult medical examination without abnormal findings: Secondary | ICD-10-CM | POA: Diagnosis not present

## 2019-08-08 ENCOUNTER — Emergency Department (HOSPITAL_COMMUNITY): Payer: Medicare HMO

## 2019-08-08 ENCOUNTER — Other Ambulatory Visit: Payer: Self-pay

## 2019-08-08 ENCOUNTER — Encounter (HOSPITAL_COMMUNITY): Payer: Self-pay

## 2019-08-08 ENCOUNTER — Inpatient Hospital Stay (HOSPITAL_COMMUNITY)
Admission: EM | Admit: 2019-08-08 | Discharge: 2019-08-11 | DRG: 872 | Disposition: A | Discharge status: Home / Self Care | Payer: Medicare HMO | Attending: Internal Medicine | Admitting: Internal Medicine

## 2019-08-08 DIAGNOSIS — Z888 Allergy status to other drugs, medicaments and biological substances status: Secondary | ICD-10-CM

## 2019-08-08 DIAGNOSIS — Z961 Presence of intraocular lens: Secondary | ICD-10-CM | POA: Diagnosis present

## 2019-08-08 DIAGNOSIS — Z20828 Contact with and (suspected) exposure to other viral communicable diseases: Secondary | ICD-10-CM | POA: Diagnosis present

## 2019-08-08 DIAGNOSIS — A419 Sepsis, unspecified organism: Secondary | ICD-10-CM

## 2019-08-08 DIAGNOSIS — M549 Dorsalgia, unspecified: Secondary | ICD-10-CM | POA: Diagnosis present

## 2019-08-08 DIAGNOSIS — J45909 Unspecified asthma, uncomplicated: Secondary | ICD-10-CM | POA: Diagnosis present

## 2019-08-08 DIAGNOSIS — M810 Age-related osteoporosis without current pathological fracture: Secondary | ICD-10-CM | POA: Diagnosis present

## 2019-08-08 DIAGNOSIS — Z880 Allergy status to penicillin: Secondary | ICD-10-CM

## 2019-08-08 DIAGNOSIS — Z79899 Other long term (current) drug therapy: Secondary | ICD-10-CM

## 2019-08-08 DIAGNOSIS — R509 Fever, unspecified: Secondary | ICD-10-CM | POA: Diagnosis not present

## 2019-08-08 DIAGNOSIS — Z79890 Hormone replacement therapy (postmenopausal): Secondary | ICD-10-CM

## 2019-08-08 DIAGNOSIS — F419 Anxiety disorder, unspecified: Secondary | ICD-10-CM | POA: Diagnosis present

## 2019-08-08 DIAGNOSIS — Z8249 Family history of ischemic heart disease and other diseases of the circulatory system: Secondary | ICD-10-CM | POA: Diagnosis not present

## 2019-08-08 DIAGNOSIS — Z825 Family history of asthma and other chronic lower respiratory diseases: Secondary | ICD-10-CM

## 2019-08-08 DIAGNOSIS — M1990 Unspecified osteoarthritis, unspecified site: Secondary | ICD-10-CM | POA: Diagnosis present

## 2019-08-08 DIAGNOSIS — G8929 Other chronic pain: Secondary | ICD-10-CM | POA: Diagnosis present

## 2019-08-08 DIAGNOSIS — L97519 Non-pressure chronic ulcer of other part of right foot with unspecified severity: Secondary | ICD-10-CM | POA: Diagnosis present

## 2019-08-08 DIAGNOSIS — K219 Gastro-esophageal reflux disease without esophagitis: Secondary | ICD-10-CM | POA: Diagnosis present

## 2019-08-08 DIAGNOSIS — Z7951 Long term (current) use of inhaled steroids: Secondary | ICD-10-CM

## 2019-08-08 DIAGNOSIS — E871 Hypo-osmolality and hyponatremia: Secondary | ICD-10-CM | POA: Diagnosis present

## 2019-08-08 DIAGNOSIS — Z9842 Cataract extraction status, left eye: Secondary | ICD-10-CM

## 2019-08-08 DIAGNOSIS — Z7982 Long term (current) use of aspirin: Secondary | ICD-10-CM

## 2019-08-08 DIAGNOSIS — L0390 Cellulitis, unspecified: Secondary | ICD-10-CM | POA: Insufficient documentation

## 2019-08-08 DIAGNOSIS — R51 Headache: Secondary | ICD-10-CM | POA: Diagnosis not present

## 2019-08-08 DIAGNOSIS — Z881 Allergy status to other antibiotic agents status: Secondary | ICD-10-CM

## 2019-08-08 DIAGNOSIS — I10 Essential (primary) hypertension: Secondary | ICD-10-CM | POA: Diagnosis not present

## 2019-08-08 DIAGNOSIS — L03119 Cellulitis of unspecified part of limb: Secondary | ICD-10-CM | POA: Diagnosis not present

## 2019-08-08 DIAGNOSIS — Z87891 Personal history of nicotine dependence: Secondary | ICD-10-CM

## 2019-08-08 DIAGNOSIS — E039 Hypothyroidism, unspecified: Secondary | ICD-10-CM | POA: Diagnosis present

## 2019-08-08 DIAGNOSIS — T502X5A Adverse effect of carbonic-anhydrase inhibitors, benzothiadiazides and other diuretics, initial encounter: Secondary | ICD-10-CM | POA: Diagnosis not present

## 2019-08-08 DIAGNOSIS — L03115 Cellulitis of right lower limb: Secondary | ICD-10-CM | POA: Diagnosis not present

## 2019-08-08 DIAGNOSIS — Z9841 Cataract extraction status, right eye: Secondary | ICD-10-CM

## 2019-08-08 DIAGNOSIS — E876 Hypokalemia: Secondary | ICD-10-CM | POA: Diagnosis not present

## 2019-08-08 DIAGNOSIS — M7989 Other specified soft tissue disorders: Secondary | ICD-10-CM | POA: Diagnosis not present

## 2019-08-08 LAB — COMPREHENSIVE METABOLIC PANEL
ALT: 27 U/L (ref 0–44)
AST: 28 U/L (ref 15–41)
Albumin: 4 g/dL (ref 3.5–5.0)
Alkaline Phosphatase: 58 U/L (ref 38–126)
Anion gap: 11 (ref 5–15)
BUN: 17 mg/dL (ref 8–23)
CO2: 25 mmol/L (ref 22–32)
Calcium: 9.2 mg/dL (ref 8.9–10.3)
Chloride: 96 mmol/L — ABNORMAL LOW (ref 98–111)
Creatinine, Ser: 0.91 mg/dL (ref 0.44–1.00)
GFR calc Af Amer: >60 mL/min (ref >60)
GFR calc non Af Amer: >60 mL/min (ref >60)
Glucose, Bld: 144 mg/dL — ABNORMAL HIGH (ref 70–99)
Potassium: 3.3 mmol/L — ABNORMAL LOW (ref 3.5–5.1)
Sodium: 132 mmol/L — ABNORMAL LOW (ref 135–145)
Total Bilirubin: 0.6 mg/dL (ref 0.3–1.2)
Total Protein: 7 g/dL (ref 6.5–8.1)

## 2019-08-08 LAB — CBC WITH DIFFERENTIAL/PLATELET
Abs Immature Granulocytes: 0.14 10*3/uL — ABNORMAL HIGH (ref 0.00–0.07)
Basophils Absolute: 0 10*3/uL (ref 0.0–0.1)
Basophils Relative: 0 %
Eosinophils Absolute: 0 10*3/uL (ref 0.0–0.5)
Eosinophils Relative: 0 %
HCT: 37.2 % (ref 36.0–46.0)
Hemoglobin: 12.1 g/dL (ref 12.0–15.0)
Immature Granulocytes: 1 %
Lymphocytes Relative: 3 %
Lymphs Abs: 0.8 10*3/uL (ref 0.7–4.0)
MCH: 31.5 pg (ref 26.0–34.0)
MCHC: 32.5 g/dL (ref 30.0–36.0)
MCV: 96.9 fL (ref 80.0–100.0)
Monocytes Absolute: 1 10*3/uL (ref 0.1–1.0)
Monocytes Relative: 4 %
Neutro Abs: 22.3 10*3/uL — ABNORMAL HIGH (ref 1.7–7.7)
Neutrophils Relative %: 92 %
Platelets: 173 10*3/uL (ref 150–400)
RBC: 3.84 MIL/uL — ABNORMAL LOW (ref 3.87–5.11)
RDW: 14.8 % (ref 11.5–15.5)
WBC: 24.3 10*3/uL — ABNORMAL HIGH (ref 4.0–10.5)
nRBC: 0 % (ref 0.0–0.2)

## 2019-08-08 LAB — SARS CORONAVIRUS 2 BY RT PCR (HOSPITAL ORDER, PERFORMED IN ~~LOC~~ HOSPITAL LAB): SARS Coronavirus 2: NEGATIVE

## 2019-08-08 LAB — URINALYSIS, ROUTINE W REFLEX MICROSCOPIC
Bilirubin Urine: NEGATIVE
Glucose, UA: NEGATIVE mg/dL
Hgb urine dipstick: NEGATIVE
Ketones, ur: NEGATIVE mg/dL
Leukocytes,Ua: NEGATIVE
Nitrite: NEGATIVE
Protein, ur: NEGATIVE mg/dL
Specific Gravity, Urine: 1.017 (ref 1.005–1.030)
pH: 6 (ref 5.0–8.0)

## 2019-08-08 LAB — LACTIC ACID, PLASMA
Lactic Acid, Venous: 1.8 mmol/L (ref 0.5–1.9)
Lactic Acid, Venous: 1.9 mmol/L (ref 0.5–1.9)

## 2019-08-08 LAB — PROTIME-INR
INR: 1.1 (ref 0.8–1.2)
Prothrombin Time: 13.9 seconds (ref 11.4–15.2)

## 2019-08-08 MED ORDER — ACETAMINOPHEN 325 MG PO TABS
650.0000 mg | ORAL_TABLET | Freq: Four times a day (QID) | ORAL | Status: DC | PRN
  Administered 2019-08-09 – 2019-08-11 (×3): 650 mg via ORAL
  Filled 2019-08-08 (×3): qty 2

## 2019-08-08 MED ORDER — ACETAMINOPHEN 650 MG RE SUPP: 650.0000 mg | Freq: Four times a day (QID) | RECTAL | Status: DC | PRN

## 2019-08-08 MED ORDER — VANCOMYCIN HCL IN DEXTROSE 1-5 GM/200ML-% IV SOLN: 1000.0000 mg | Freq: Once | INTRAVENOUS | Status: DC

## 2019-08-08 MED ORDER — ENOXAPARIN SODIUM 40 MG/0.4ML ~~LOC~~ SOLN
40.0000 mg | SUBCUTANEOUS | Status: DC
  Administered 2019-08-09 – 2019-08-10 (×3): 40 mg via SUBCUTANEOUS
  Filled 2019-08-08 (×3): qty 0.4

## 2019-08-08 MED ORDER — POTASSIUM CHLORIDE CRYS ER 20 MEQ PO TBCR
40.0000 meq | EXTENDED_RELEASE_TABLET | Freq: Once | ORAL | Status: AC
  Administered 2019-08-08: 21:00:00 40 meq via ORAL
  Filled 2019-08-08: qty 2

## 2019-08-08 MED ORDER — SODIUM CHLORIDE 0.9 % IV SOLN
2.0000 g | Freq: Once | INTRAVENOUS | Status: AC
  Administered 2019-08-08: 23:00:00 2 g via INTRAVENOUS
  Filled 2019-08-08: qty 2

## 2019-08-08 MED ORDER — POTASSIUM CHLORIDE CRYS ER 20 MEQ PO TBCR: 40.0000 meq | EXTENDED_RELEASE_TABLET | Freq: Once | ORAL | Status: DC

## 2019-08-08 MED ORDER — POTASSIUM CHLORIDE IN NACL 20-0.9 MEQ/L-% IV SOLN
INTRAVENOUS | Status: DC
  Administered 2019-08-09: 02:00:00 via INTRAVENOUS

## 2019-08-08 MED ORDER — METRONIDAZOLE IN NACL 5-0.79 MG/ML-% IV SOLN
500.0000 mg | Freq: Once | INTRAVENOUS | Status: AC
  Administered 2019-08-09: 500 mg via INTRAVENOUS
  Filled 2019-08-08: qty 100

## 2019-08-08 MED ORDER — SODIUM CHLORIDE 0.9 % IV SOLN
2.0000 g | Freq: Two times a day (BID) | INTRAVENOUS | Status: DC
  Administered 2019-08-09 – 2019-08-10 (×3): 2 g via INTRAVENOUS
  Filled 2019-08-08 (×3): qty 2

## 2019-08-08 MED ORDER — VANCOMYCIN HCL 500 MG IV SOLR
500.0000 mg | Freq: Two times a day (BID) | INTRAVENOUS | Status: DC
  Administered 2019-08-09 – 2019-08-10 (×4): 500 mg via INTRAVENOUS
  Filled 2019-08-08 (×7): qty 500

## 2019-08-08 MED ORDER — SODIUM CHLORIDE 0.9 % IV BOLUS
1000.0000 mL | Freq: Once | INTRAVENOUS | Status: AC
  Administered 2019-08-08: 20:00:00 1000 mL via INTRAVENOUS

## 2019-08-08 MED ORDER — VANCOMYCIN HCL 10 G IV SOLR
1500.0000 mg | Freq: Once | INTRAVENOUS | Status: AC
  Administered 2019-08-09: 02:00:00 1500 mg via INTRAVENOUS
  Filled 2019-08-08: qty 1500

## 2019-08-08 MED ORDER — ACETAMINOPHEN 325 MG PO TABS
650.0000 mg | ORAL_TABLET | Freq: Once | ORAL | Status: AC
  Administered 2019-08-08: 650 mg via ORAL
  Filled 2019-08-08: qty 2

## 2019-08-08 NOTE — ED Provider Notes (Signed)
[facility] EMERGENCY DEPARTMENT Provider Note   CSN: [id number] Arrival date & time: 08/08/19  1853     History   Chief Complaint Chief Complaint  Patient presents with  . Fever  . Headache    HPI [name] is a 78 y.o. female.     HPI 78 year old female presents with fever and headache.  Started noticing symptoms at church where she started having chills.  Was at the grocery store and developed headache that is frontal.  When she got home she was having dry heaving and fever up to 103.  Was given Tylenol.  Last Tylenol dose around 2 PM.  She has had a cough though she is very vague on how long this is been going on but sounds like for a couple weeks.  Has recently had multiple different antibiotics for sinus infection.  No urinary symptoms, back pain, chest pain, shortness of breath or rash/cellulitis.  The patient does tell me that she has had an ulcer on her right great toe for over 1 year that she thinks is a little worse. No pain in her sinuses today. No sore throat.   Past Medical History:  Diagnosis Date  . Anemia   . Anxiety   . Arthritis   . Asthma   . Constipation   . GERD (gastroesophageal reflux disease)   . High blood pressure   . Hypothyroidism   . Seasonal allergies   . Thyroid disease   . Varicose veins   . Vertigo 11/23/2013    Patient Active Problem List   Diagnosis Date Noted  . Cellulitis 08/08/2019  . Fever 08/08/2019  . 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  . Adverse food reaction 09/24/2018  . Mild persistent asthma without complication [id number]  . Dyspnea on exertion 01/24/2017  . Cough variant asthma  vs UACS 01/24/2017  . High risk medication use 09/25/2016  . Varicose veins of lower extremities with other complications [id number]  . Varicose veins 02/15/2014  . Vertigo 11/23/2013  . Pain in limb 10/04/2013  . Hip bursitis 02/11/2013  . Constipation 02/16/2009  . RECTAL BLEEDING 02/16/2009  . HIGH BLOOD PRESSURE 01/29/2008     Past Surgical History:  Procedure Laterality Date  . APPENDECTOMY    . CATARACT EXTRACTION W/PHACO Left 01/27/2017   Procedure: CATARACT EXTRACTION PHACO AND INTRAOCULAR LENS PLACEMENT (IOC);  Surgeon: Tonny Branch, MD;  Location: [facility];  Service: Ophthalmology;  Laterality: Left;  CDE: 29.90  . CATARACT EXTRACTION W/PHACO Right 02/24/2017   Procedure: CATARACT EXTRACTION PHACO AND INTRAOCULAR LENS PLACEMENT (IOC);  Surgeon: Tonny Branch, MD;  Location: [facility];  Service: Ophthalmology;  Laterality: Right;  CDE: 8.57  . CHOLECYSTECTOMY    . COLONOSCOPY N/A 10/01/2016   Procedure: COLONOSCOPY;  Surgeon: Daneil Dolin, MD;  Location: [facility];  Service: Endoscopy;  Laterality: N/A;  815   . FLEXOR TENDON REPAIR Right 11/06/2017   Procedure: RIGHT WRIST FLEXOR TENDON REPAIRAND STT [place];  Surgeon: Leanora Cover, MD;  Location: [facility];  Service: Orthopedics;  Laterality: Right;  . TUBAL LIGATION       OB History    Gravida  2   Para  2   Term      Preterm      AB      Living  2     SAB      TAB      Ectopic      Multiple      Live  Births  2            Home Medications    Prior to Admission medications   Medication Sig Start Date End Date Taking? Authorizing Provider  albuterol (VENTOLIN HFA) 108 (90 Base) MCG/ACT inhaler Inhale 2 puffs into the lungs every 6 (six) hours as needed for wheezing or shortness of breath.   Yes [provider]  ALPRAZolam Duanne Moron) 0.5 MG tablet Take 0.5 mg by mouth at bedtime.    Yes [provider]  amLODipine (NORVASC) 2.5 MG tablet Take 2.5 mg by mouth every evening.  02/09/13  Yes [provider]  buPROPion (WELLBUTRIN SR) 100 MG 12 hr tablet Take 100 mg by mouth daily.  08/11/18  Yes [provider]  famotidine (PEPCID) 20 MG tablet TAKE (1) TABLET DAILY AT BEDTIME. 01/21/18  Yes Tanda Rockers, MD  gabapentin (NEURONTIN) 100 MG capsule Take 1 capsule (100 mg total) by  mouth every 8 (eight) hours as needed. Patient taking differently: Take 100 mg by mouth 3 (three) times daily as needed (for pain).  12/05/15  Yes Carole Civil, MD  levothyroxine (SYNTHROID, LEVOTHROID) 125 MCG tablet Take 67.5 mcg by mouth daily before breakfast.  01/06/13  Yes [provider]  losartan (COZAAR) 50 MG tablet Take 50 mg by mouth daily.    Yes [provider]  Multiple Vitamin (MULTIVITAMIN) tablet Take 1 tablet by mouth daily.   Yes [provider]  pantoprazole (PROTONIX) 40 MG tablet TAKE 1 TABLET BY MOUTH ONCE DAILY 30 TO 60 MINUTES BEFORE FIRST MEAL OF THE DAY. 07/29/17  Yes Tanda Rockers, MD  pravastatin (PRAVACHOL) 10 MG tablet Take 10 mg by mouth daily.  07/28/18  Yes [provider]  raloxifene (EVISTA) 60 MG tablet TAKE 1 TABLET BY MOUTH EVERY MORNING. Patient taking differently: Take 30 mg by mouth every other day.  07/27/18  Yes Estill Dooms, NP  SYMBICORT 80-4.5 MCG/ACT inhaler INHALE 2 PUFFS INTO THE LUNGS TWICE DAILY. 02/02/18  Yes Tanda Rockers, MD  traMADol (ULTRAM) 50 MG tablet Take 1 tablet by mouth daily as needed for moderate pain or severe pain.  08/31/13  Yes [provider]  triamterene-hydrochlorothiazide (MAXZIDE-25) 37.5-25 MG tablet Take 1 tablet by mouth daily. 08/02/19  Yes [provider]  valACYclovir (VALTREX) 500 MG tablet Take 500 mg by mouth daily.  12/15/18  Yes [provider]  aspirin EC 81 MG tablet Take 81 mg by mouth daily.    [provider]  EPINEPHrine 0.3 mg/0.3 mL IJ SOAJ injection Inject 0.3 mLs (0.3 mg total) into the muscle once as needed for anaphylaxis. 12/30/18   Valentina Shaggy, MD  hydrochlorothiazide (HYDRODIURIL) 25 MG tablet Take 25 mg by mouth daily.  07/28/13   [provider]    Family History Family History  Problem Relation Age of Onset  . Hypertension Mother   . Varicose Veins Father   . Heart attack Father   . Heart disease  Father   . Hypertension Brother   . COPD Brother     Social History Social History   Tobacco Use  . Smoking status: Former Smoker    Packs/day: 0.25    Years: 5.00    Pack years: 1.25    Types: Cigarettes    Quit date: 12/02/1997    Years since quitting: 21.6  . Smokeless tobacco: Never Used  Substance Use Topics  . Alcohol use: No    Alcohol/week: 1.0  standard drinks    Types: 1 Glasses of wine per week  . Drug use: No     Allergies   Amoxicillin-pot clavulanate, Ace inhibitors, Entex la, and Levofloxacin   Review of Systems Review of Systems  Constitutional: Positive for chills and fever.  HENT: Negative for sinus pain and sore throat.   Respiratory: Positive for cough. Negative for shortness of breath.   Cardiovascular: Negative for chest pain.  Gastrointestinal: Positive for nausea. Negative for abdominal pain and vomiting.  Skin: Positive for wound.  Neurological: Positive for headaches.  All other systems reviewed and are negative.    Physical Exam Updated Vital Signs BP 121/81   Pulse 95   Temp 98.6 F (37 C) (Oral)   Resp 20   Ht 5\' 4"  (1.626 m)   Wt 69.9 kg   SpO2 97%   BMI 26.43 kg/m   Physical Exam Vitals signs and nursing note reviewed.  Constitutional:      General: She is not in acute distress.    Appearance: She is well-developed. She is not ill-appearing or diaphoretic.  HENT:     Head: Normocephalic and atraumatic.     Right Ear: External ear normal.     Left Ear: External ear normal.     Nose: Nose normal.  Eyes:     General:        Right eye: No discharge.        Left eye: No discharge.     Extraocular Movements: Extraocular movements intact.     Pupils: Pupils are equal, round, and reactive to light.  Neck:     Musculoskeletal: Normal range of motion and neck supple. No neck rigidity.  Cardiovascular:     Rate and Rhythm: Normal rate and regular rhythm.     Heart sounds: Normal heart sounds.  Pulmonary:     Effort:  Pulmonary effort is normal.     Breath sounds: Normal breath sounds.  Abdominal:     Palpations: Abdomen is soft.     Tenderness: There is no abdominal tenderness.  Musculoskeletal:       Feet:  Skin:    General: Skin is warm and dry.  Neurological:     Mental Status: She is alert.     Comments: CN 3-12 grossly intact. 5/5 strength in all 4 extremities. Grossly normal sensation. Normal finger to nose.   Psychiatric:        Mood and Affect: Mood is not anxious.      ED Treatments / Results  Labs (all labs ordered are listed, but only abnormal results are displayed) Labs Reviewed  COMPREHENSIVE METABOLIC PANEL - Abnormal; Notable for the following components:      Result Value   Sodium 132 (*)    Potassium 3.3 (*)    Chloride 96 (*)    Glucose, Bld 144 (*)    All other components within normal limits  CBC WITH DIFFERENTIAL/PLATELET - Abnormal; Notable for the following components:   WBC 24.3 (*)    RBC 3.84 (*)    Neutro Abs 22.3 (*)    Abs Immature Granulocytes 0.14 (*)    All other components within normal limits  SARS CORONAVIRUS 2 (HOSPITAL ORDER, [facility])  CULTURE, BLOOD (ROUTINE X 2)  CULTURE, BLOOD (ROUTINE X 2)  LACTIC ACID, PLASMA  LACTIC ACID, PLASMA  PROTIME-INR  URINALYSIS, ROUTINE W REFLEX MICROSCOPIC    EKG None  Radiology Ct Head Wo Contrast  Result Date: 08/08/2019  CLINICAL DATA:  Headache, acute EXAM: CT HEAD WITHOUT CONTRAST TECHNIQUE: Contiguous axial images were obtained from the base of the skull through the vertex without intravenous contrast. COMPARISON:  MRI 2011 FINDINGS: Brain: No evidence of acute territorial infarction, hemorrhage, hydrocephalus,extra-axial collection or mass lesion/mass effect. There is dilatation the ventricles and sulci consistent with age-related atrophy. Low-attenuation changes in the deep white matter consistent with small vessel ischemia. Vascular: No hyperdense vessel or unexpected  calcification. Skull: The skull is intact. No fracture or focal lesion identified. Sinuses/Orbits: The visualized paranasal sinuses and mastoid air cells are clear. The orbits and globes intact. Other: None IMPRESSION: No acute intracranial abnormality. Findings consistent with age related atrophy and chronic small vessel ischemia Electronically Signed   By: Prudencio Pair M.D.   On: 08/08/2019 22:21   Dg Chest Portable 1 View  Result Date: 08/08/2019 CLINICAL DATA:  Fever EXAM: PORTABLE CHEST 1 VIEW COMPARISON:  Portable exam 1954 hours compared to 05/08/2018 FINDINGS: Upper normal heart size. Mediastinal contours and pulmonary vascularity normal. Lungs clear. No infiltrate, pleural effusion or pneumothorax. No acute osseous findings. IMPRESSION: No acute abnormalities. Electronically Signed   By: Lavonia Dana M.D.   On: 08/08/2019 20:02   Dg Foot Complete Right  Result Date: 08/08/2019 CLINICAL DATA:  Toe ulcer fever EXAM: RIGHT FOOT COMPLETE - 3+ VIEW COMPARISON:  None. FINDINGS: There is significant dorsal soft tissue swelling seen. There is some limited visualization of the distal phalanges due to positioning. No definite osseous fracture or area of cortical destruction. Bipartite medial sesamoid is seen. Tiny calcaneal enthesophyte noted. IMPRESSION: Dorsal soft tissue swelling.  No definite evidence of osteomyelitis. Electronically Signed   By: Prudencio Pair M.D.   On: 08/08/2019 20:15    Procedures Procedures (including critical care time)  Medications Ordered in ED Medications  ceFEPIme (MAXIPIME) 2 g in sodium chloride 0.9 % 100 mL IVPB (2 g Intravenous New Bag/Given 08/08/19 2233)  metroNIDAZOLE (FLAGYL) IVPB 500 mg (has no administration in time range)  vancomycin (VANCOCIN) 1,500 mg in sodium chloride 0.9 % 500 mL IVPB (has no administration in time range)  ceFEPIme (MAXIPIME) 2 g in sodium chloride 0.9 % 100 mL IVPB (has no administration in time range)  vancomycin (VANCOCIN) 500 mg in  sodium chloride 0.9 % 100 mL IVPB (has no administration in time range)  sodium chloride 0.9 % bolus 1,000 mL (1,000 mLs Intravenous New Bag/Given 08/08/19 2020)  acetaminophen (TYLENOL) tablet 650 mg (650 mg Oral Given 08/08/19 2020)  potassium chloride SA (K-DUR) CR tablet 40 mEq (40 mEq Oral Given 08/08/19 2030)     Initial Impression / Assessment and Plan / ED Course  I have reviewed the triage vital signs and the nursing notes.  Pertinent labs & imaging results that were available during my care of the patient were reviewed by me and considered in my medical decision making (see chart for details).        Initial work-up is nonlocalizing.  Coronavirus testing negative.  Urinalysis and chest x-ray.  On reevaluation, there is not just a wound but her entire right foot is swollen.  This appears to be the cause of her fever as this appears to be cellulitis.  No obvious osteomyelitis.  Given the significant WBC elevation and the fever at home, I think is reasonable to treat with IV antibiotics.  I think necrotizing fasciitis is less likely.  Fever is the most likely cause of her headache and so I think meningitis or encephalitis  is pretty unlikely.  Discussed with Dr. Maudie Mercury for admission.  [name] was evaluated in Emergency Department on 08/08/2019 for the symptoms described in the history of present illness. She was evaluated in the context of the global COVID-19 pandemic, which necessitated consideration that the patient might be at risk for infection with the SARS-CoV-2 virus that causes COVID-19. Institutional protocols and algorithms that pertain to the evaluation of patients at risk for COVID-19 are in a state of rapid change based on information released by regulatory bodies including the CDC and federal and state organizations. These policies and algorithms were followed during the patient's care in the ED.   Final Clinical Impressions(s) / ED Diagnoses   Final diagnoses:  Sepsis due  to cellulitis [facility])    ED Discharge Orders    None       Sherwood Gambler, MD 08/08/19 2248

## 2019-08-08 NOTE — ED Notes (Signed)
hospitalist in room  

## 2019-08-08 NOTE — ED Triage Notes (Signed)
Pt c/o of fever 100.3, nausea, headache, all starting this morning. Took tylenol. Went to church. Went to [facility] and was told to come here for eval.

## 2019-08-08 NOTE — H&P (Signed)
TRH H&P    Patient Demographics:    [name], is a 78 y.o. female  MRN: [id number]  DOB - [birth year]  Admit Date - 08/08/2019  Referring MD/NP/PA:  Sherwood Gambler  Outpatient Primary MD for the patient is [name], MD  Patient coming from: home  Chief complaint- Cellulitis right foot   HPI:    [name]  is a 78 y.o. female, w hypertension, hypothyroidism, asthma, anxiety, gerd, apparently presents with skin ulcer on the right big toe for the past 1 week, now has worsening right foot redness and fever to 102, and dry heaves and chills today.  H/o skin shaving of callus at that big toe by podiatry in the past.   In ED,  T 99, P 92  R 20 , Bp 114/81  Pox 97% Wt 69.9 kg  Na 132, K 3.3, Bun 17, Creatinine 0.91 Ast 28, Alt 27 Wbc 24.3, Hgb 12.1, Plt 173 (neutrophinl % 92) Lactic acid 1.8 Urinalysis negative INR 1.1  Blood culture x2 Covid -19 negative  Pt given Vanco 1.5gm iv x1, cefepime 2gm iv x1 and Flagyl 500mg  iv x1 in the ED along with Ns 1L iv x1.   Pt will be admitted for cellulitis with significant leukocytosis.    Review of systems:    In addition to the HPI above,  Slight headache, no neck stiffness,  Hx of recent sinus infections, tx with abx No changes with Vision or hearing, No problems swallowing food or Liquids, No Chest pain, Cough or Shortness of Breath, No Abdominal pain, No Nausea or Vomiting, bowel movements are regular, No Blood in stool or Urine, No dysuria,  No new joints pains-aches,  No new weakness, tingling, numbness in any extremity, No recent weight gain or loss, No polyuria, polydypsia or polyphagia, No significant Mental Stressors.  All other systems reviewed and are negative.    Past History of the following :    Past Medical History:  Diagnosis Date   Anemia    Anxiety    Arthritis    Asthma    Constipation    GERD  (gastroesophageal reflux disease)    High blood pressure    Hypothyroidism    Seasonal allergies    Thyroid disease    Varicose veins    Vertigo 11/23/2013      Past Surgical History:  Procedure Laterality Date   APPENDECTOMY     CATARACT EXTRACTION W/PHACO Left 01/27/2017   Procedure: CATARACT EXTRACTION PHACO AND INTRAOCULAR LENS PLACEMENT ([place]);  Surgeon: Tonny Branch, MD;  Location: [facility];  Service: Ophthalmology;  Laterality: Left;  CDE: 29.90   CATARACT EXTRACTION W/PHACO Right 02/24/2017   Procedure: CATARACT EXTRACTION PHACO AND INTRAOCULAR LENS PLACEMENT ([facility]);  Surgeon: Tonny Branch, MD;  Location: [facility];  Service: Ophthalmology;  Laterality: Right;  CDE: 8.57   CHOLECYSTECTOMY     COLONOSCOPY N/A 10/01/2016   Procedure: COLONOSCOPY;  Surgeon: Daneil Dolin, MD;  Location: [facility];  Service: Endoscopy;  Laterality: N/A;  815  FLEXOR TENDON REPAIR Right 11/06/2017   Procedure: RIGHT WRIST FLEXOR TENDON REPAIRAND STT [place];  Surgeon: Leanora Cover, MD;  Location: [facility];  Service: Orthopedics;  Laterality: Right;   TUBAL LIGATION        Social History:      Social History   Tobacco Use   Smoking status: Former Smoker    Packs/day: 0.25    Years: 5.00    Pack years: 1.25    Types: Cigarettes    Quit date: 12/02/1997    Years since quitting: 21.6   Smokeless tobacco: Never Used  Substance Use Topics   Alcohol use: No    Alcohol/week: 1.0 standard drinks    Types: 1 Glasses of wine per week       Family History :     Family History  Problem Relation Age of Onset   Hypertension Mother    Varicose Veins Father    Heart attack Father    Heart disease Father    Hypertension Brother    COPD Brother        Home Medications:   Prior to Admission medications   Medication Sig Start Date End Date Taking? Authorizing Provider  albuterol (VENTOLIN HFA) 108 (90 Base) MCG/ACT inhaler Inhale 2 puffs into the lungs  every 6 (six) hours as needed for wheezing or shortness of breath.   Yes [provider]  ALPRAZolam Duanne Moron) 0.5 MG tablet Take 0.5 mg by mouth at bedtime.    Yes [provider]  amLODipine (NORVASC) 2.5 MG tablet Take 2.5 mg by mouth every evening.  02/09/13  Yes [provider]  buPROPion (WELLBUTRIN SR) 100 MG 12 hr tablet Take 100 mg by mouth daily.  08/11/18  Yes [provider]  famotidine (PEPCID) 20 MG tablet TAKE (1) TABLET DAILY AT BEDTIME. 01/21/18  Yes Tanda Rockers, MD  gabapentin (NEURONTIN) 100 MG capsule Take 1 capsule (100 mg total) by mouth every 8 (eight) hours as needed. Patient taking differently: Take 100 mg by mouth 3 (three) times daily as needed (for pain).  12/05/15  Yes Carole Civil, MD  levothyroxine (SYNTHROID, LEVOTHROID) 125 MCG tablet Take 67.5 mcg by mouth daily before breakfast.  01/06/13  Yes [provider]  losartan (COZAAR) 50 MG tablet Take 50 mg by mouth daily.    Yes [provider]  Multiple Vitamin (MULTIVITAMIN) tablet Take 1 tablet by mouth daily.   Yes [provider]  pantoprazole (PROTONIX) 40 MG tablet TAKE 1 TABLET BY MOUTH ONCE DAILY 30 TO 60 MINUTES BEFORE FIRST MEAL OF THE DAY. 07/29/17  Yes Tanda Rockers, MD  pravastatin (PRAVACHOL) 10 MG tablet Take 10 mg by mouth daily.  07/28/18  Yes [provider]  raloxifene (EVISTA) 60 MG tablet TAKE 1 TABLET BY MOUTH EVERY MORNING. Patient taking differently: Take 30 mg by mouth every other day.  07/27/18  Yes Estill Dooms, NP  SYMBICORT 80-4.5 MCG/ACT inhaler INHALE 2 PUFFS INTO THE LUNGS TWICE DAILY. 02/02/18  Yes Tanda Rockers, MD  traMADol (ULTRAM) 50 MG tablet Take 1 tablet by mouth daily as needed for moderate pain or severe pain.  08/31/13  Yes [provider]  triamterene-hydrochlorothiazide (MAXZIDE-25) 37.5-25 MG tablet Take 1 tablet by mouth daily. 08/02/19  Yes [provider]  valACYclovir  (VALTREX) 500 MG tablet Take 500 mg by mouth daily.  12/15/18  Yes [provider]  aspirin EC 81 MG tablet Take 81 mg by  mouth daily.    [provider]  EPINEPHrine 0.3 mg/0.3 mL IJ SOAJ injection Inject 0.3 mLs (0.3 mg total) into the muscle once as needed for anaphylaxis. 12/30/18   Sylvia Shaggy, MD  hydrochlorothiazide (HYDRODIURIL) 25 MG tablet Take 25 mg by mouth daily.  07/28/13   [provider]     Allergies:     Allergies  Allergen Reactions   Amoxicillin-Pot Clavulanate Nausea And Vomiting, Other (See Comments), Itching and Nausea Only    Has patient had a PCN reaction causing immediate rash, facial/tongue/throat swelling, SOB or lightheadedness with hypotension: No Has patient had a PCN reaction causing severe rash involving mucus membranes or skin necrosis: No Has patient had a PCN reaction that required hospitalization: No Has patient had a PCN reaction occurring within the last 10 years: No If all of the above answers are "NO", then may proceed with Cephalosporin use. Has patient had a PCN reaction causing immediate rash, facial/tongue/throat swelling, SOB or lightheadedness with hypotension: No Has patient had a PCN reaction causing severe rash involving mucus membranes or skin necrosis: No Has patient had a PCN reaction that required hospitalization: No Has patient had a PCN reaction occurring within the last 10 years: No If all of the above answers are "NO", then may proceed with Cephalosporin use.    Ace Inhibitors Cough   Entex La Itching   Levofloxacin Hives and Itching     Physical Exam:   Vitals  Blood pressure 121/81, pulse 95, temperature 98.6 F (37 C), temperature source Oral, resp. rate 20, height 5\' 4"  (1.626 m), weight 69.9 kg, SpO2 97 %.  1.  General: axoxo3  2. Psychiatric: euthymic  3. Neurologic: nonfocal  4. HEENMT:  Anicteric, no photophobia, pupils 1.61mm symmetric, direct, consensual, intact  5.  Respiratory : CTAB  6. Cardiovascular : rrr s1, s2, no m/g/r  7. Gastrointestinal:  Abd: soft, nt, nd, +bs  8. Skin:  Ext: no c/c/e,  Slight redness and warmth of the dorsum of the right foot,  Slight 1x67mm indentation of skin under the 1st toe right foot,  Hallux valgus, varicose veins.   9.Musculoskeletal:  Good ROM ,    Data Review:    CBC Recent Labs  Lab 08/08/19 1932  WBC 24.3*  HGB 12.1  HCT 37.2  PLT 173  MCV 96.9  MCH 31.5  MCHC 32.5  RDW 14.8  LYMPHSABS 0.8  MONOABS 1.0  EOSABS 0.0  BASOSABS 0.0   ------------------------------------------------------------------------------------------------------------------  Results for orders placed or performed during the hospital encounter of 08/08/19 (from the past 48 hour(s))  Comprehensive metabolic panel     Status: Abnormal   Collection Time: 08/08/19  7:32 PM  Result Value Ref Range   Sodium 132 (L) 135 - 145 mmol/L   Potassium 3.3 (L) 3.5 - 5.1 mmol/L   Chloride 96 (L) 98 - 111 mmol/L   CO2 25 22 - 32 mmol/L   Glucose, Bld 144 (H) 70 - 99 mg/dL   BUN 17 8 - 23 mg/dL   Creatinine, Ser 0.91 0.44 - 1.00 mg/dL   Calcium 9.2 8.9 - 10.3 mg/dL   Total Protein 7.0 6.5 - 8.1 g/dL   Albumin 4.0 3.5 - 5.0 g/dL   AST 28 15 - 41 U/L   ALT 27 0 - 44 U/L   Alkaline Phosphatase 58 38 - 126 U/L   Total Bilirubin 0.6 0.3 - 1.2 mg/dL   GFR calc non Af Amer >60 >60 mL/min  GFR calc Af Amer >60 >60 mL/min   Anion gap 11 5 - 15    Comment: Performed at [facility], [address], [place], [place] [address]  Lactic acid, plasma     Status: None   Collection Time: 08/08/19  7:32 PM  Result Value Ref Range   Lactic Acid, Venous 1.8 0.5 - 1.9 mmol/L    Comment: Performed at [facility], [address], [place], [place] [address]  CBC with Differential     Status: Abnormal   Collection Time: 08/08/19  7:32 PM  Result Value Ref Range   WBC 24.3 (H) 4.0 - 10.5 K/uL   RBC 3.84 (L) 3.87 - 5.11 MIL/uL   Hemoglobin 12.1  12.0 - 15.0 g/dL   HCT 37.2 36.0 - 46.0 %   MCV 96.9 80.0 - 100.0 fL   MCH 31.5 26.0 - 34.0 pg   MCHC 32.5 30.0 - 36.0 g/dL   RDW 14.8 11.5 - 15.5 %   Platelets 173 150 - 400 K/uL   nRBC 0.0 0.0 - 0.2 %   Neutrophils Relative % 92 %   Neutro Abs 22.3 (H) 1.7 - 7.7 K/uL   Lymphocytes Relative 3 %   Lymphs Abs 0.8 0.7 - 4.0 K/uL   Monocytes Relative 4 %   Monocytes Absolute 1.0 0.1 - 1.0 K/uL   Eosinophils Relative 0 %   Eosinophils Absolute 0.0 0.0 - 0.5 K/uL   Basophils Relative 0 %   Basophils Absolute 0.0 0.0 - 0.1 K/uL   Immature Granulocytes 1 %   Abs Immature Granulocytes 0.14 (H) 0.00 - 0.07 K/uL    Comment: Performed at [facility], [address], [place], [place] [address]  Protime-INR     Status: None   Collection Time: 08/08/19  7:32 PM  Result Value Ref Range   Prothrombin Time 13.9 11.4 - 15.2 seconds   INR 1.1 0.8 - 1.2    Comment: (NOTE) INR goal varies based on device and disease states. Performed at [facility], [address] [place], [place] [address]   Culture, blood (Routine x 2)     Status: None (Preliminary result)   Collection Time: 08/08/19  7:33 PM   Specimen: Left Antecubital; Blood  Result Value Ref Range   Specimen Description LEFT ANTECUBITAL    Special Requests      BOTTLES DRAWN AEROBIC AND ANAEROBIC Blood Culture adequate volume Performed at [facility], [address] [place], [place] [address]    Culture PENDING    Report Status PENDING   SARS Coronavirus 2 [facility] order, Performed in [facility] hospital lab) Nasopharyngeal Nasopharyngeal Swab     Status: None   Collection Time: 08/08/19  7:46 PM   Specimen: Nasopharyngeal Swab  Result Value Ref Range   SARS Coronavirus 2 NEGATIVE NEGATIVE    Comment: (NOTE) If result is NEGATIVE SARS-CoV-2 target nucleic acids are NOT DETECTED. The SARS-CoV-2 RNA is generally detectable in upper and lower  respiratory specimens during the acute phase of infection. The lowest  concentration of  SARS-CoV-2 viral copies this assay can detect is 250  copies / mL. A negative result does not preclude SARS-CoV-2 infection  and should not be used as the sole basis for treatment or other  patient management decisions.  A negative result may occur with  improper specimen collection / handling, submission of specimen other  than nasopharyngeal swab, presence of viral mutation(s) within the  areas targeted by this assay, and inadequate number of  viral copies  (<250 copies / mL). A negative result must be combined with clinical  observations, patient history, and epidemiological information. If result is POSITIVE SARS-CoV-2 target nucleic acids are DETECTED. The SARS-CoV-2 RNA is generally detectable in upper and lower  respiratory specimens dur ing the acute phase of infection.  Positive  results are indicative of active infection with SARS-CoV-2.  Clinical  correlation with patient history and other diagnostic information is  necessary to determine patient infection status.  Positive results do  not rule out bacterial infection or co-infection with other viruses. If result is PRESUMPTIVE POSTIVE SARS-CoV-2 nucleic acids MAY BE PRESENT.   A presumptive positive result was obtained on the submitted specimen  and confirmed on repeat testing.  While 2019 novel coronavirus  (SARS-CoV-2) nucleic acids may be present in the submitted sample  additional confirmatory testing may be necessary for epidemiological  and / or clinical management purposes  to differentiate between  SARS-CoV-2 and other Sarbecovirus currently known to infect humans.  If clinically indicated additional testing with an alternate test  methodology [phone]) is advised. The SARS-CoV-2 RNA is generally  detectable in upper and lower respiratory sp ecimens during the acute  phase of infection. The expected result is Negative. Fact Sheet for Patients:  StrictlyIdeas.no Fact Sheet for Healthcare  Providers: BankingDealers.co.za This test is not yet approved or cleared by the [place] FDA and has been authorized for detection and/or diagnosis of SARS-CoV-2 by FDA under an Emergency Use Authorization (EUA).  This EUA will remain in effect (meaning this test can be used) for the duration of the COVID-19 declaration under Section 564(b)(1) of the Act, 21 U.S.C. section 360bbb-3(b)(1), unless the authorization is terminated or revoked sooner. Performed at [facility], [address], [place], [place] [address]   Lactic acid, plasma     Status: None   Collection Time: 08/08/19  8:33 PM  Result Value Ref Range   Lactic Acid, Venous 1.9 0.5 - 1.9 mmol/L    Comment: Performed at [facility], [address], [place], [place] [address]  Culture, blood (Routine x 2)     Status: None (Preliminary result)   Collection Time: 08/08/19  8:33 PM   Specimen: Right Antecubital; Blood  Result Value Ref Range   Specimen Description RIGHT ANTECUBITAL    Special Requests      BOTTLES DRAWN AEROBIC AND ANAEROBIC Blood Culture adequate volume Performed at [place] [place], [place] [address]    Culture PENDING    Report Status PENDING   Urinalysis, Routine w reflex microscopic     Status: None   Collection Time: 08/08/19  8:33 PM  Result Value Ref Range   Color, Urine YELLOW YELLOW   APPearance CLEAR CLEAR   Specific Gravity, Urine 1.017 1.005 - 1.030   pH 6.0 5.0 - 8.0   Glucose, UA NEGATIVE NEGATIVE mg/dL   Hgb urine dipstick NEGATIVE NEGATIVE   Bilirubin Urine NEGATIVE NEGATIVE   Ketones, ur NEGATIVE NEGATIVE mg/dL   Protein, ur NEGATIVE NEGATIVE mg/dL   Nitrite NEGATIVE NEGATIVE   Leukocytes,Ua NEGATIVE NEGATIVE    Comment: Performed at [facility], [address], [place], [place] [address]    Chemistries  Recent Labs  Lab 08/08/19 1932  NA 132*  K 3.3*  CL 96*  CO2 25  GLUCOSE 144*  BUN 17  CREATININE 0.91  CALCIUM 9.2  AST 28    ALT 27  ALKPHOS 58  BILITOT 0.6   ------------------------------------------------------------------------------------------------------------------  ------------------------------------------------------------------------------------------------------------------  GFR: Estimated Creatinine Clearance: 49.7 mL/min (by C-G formula based on SCr of 0.91 mg/dL). Liver Function Tests: Recent Labs  Lab 08/08/19 1932  AST 28  ALT 27  ALKPHOS 58  BILITOT 0.6  PROT 7.0  ALBUMIN 4.0   No results for input(s): LIPASE, AMYLASE in the last 168 hours. No results for input(s): AMMONIA in the last 168 hours. Coagulation Profile: Recent Labs  Lab 08/08/19 1932  INR 1.1   Cardiac Enzymes: No results for input(s): CKTOTAL, CKMB, CKMBINDEX, TROPONINI in the last 168 hours. BNP (last 3 results) No results for input(s): PROBNP in the last 8760 hours. HbA1C: No results for input(s): HGBA1C in the last 72 hours. CBG: No results for input(s): GLUCAP in the last 168 hours. Lipid Profile: No results for input(s): CHOL, HDL, LDLCALC, TRIG, CHOLHDL, LDLDIRECT in the last 72 hours. Thyroid Function Tests: No results for input(s): TSH, T4TOTAL, FREET4, T3FREE, THYROIDAB in the last 72 hours. Anemia Panel: No results for input(s): VITAMINB12, FOLATE, FERRITIN, TIBC, IRON, RETICCTPCT in the last 72 hours.  --------------------------------------------------------------------------------------------------------------- Urine analysis:    Component Value Date/Time   COLORURINE YELLOW 08/08/2019 2033   APPEARANCEUR CLEAR 08/08/2019 2033   LABSPEC 1.017 08/08/2019 2033   PHURINE 6.0 08/08/2019 2033   GLUCOSEU NEGATIVE 08/08/2019 2033   HGBUR NEGATIVE 08/08/2019 2033   BILIRUBINUR NEGATIVE 08/08/2019 2033   [place] NEGATIVE 08/08/2019 2033   PROTEINUR NEGATIVE 08/08/2019 2033   NITRITE NEGATIVE 08/08/2019 2033   LEUKOCYTESUR NEGATIVE 08/08/2019 2033      Imaging Results:    Ct Head Wo  Contrast  Result Date: 08/08/2019 CLINICAL DATA:  Headache, acute EXAM: CT HEAD WITHOUT CONTRAST TECHNIQUE: Contiguous axial images were obtained from the base of the skull through the vertex without intravenous contrast. COMPARISON:  MRI 2011 FINDINGS: Brain: No evidence of acute territorial infarction, hemorrhage, hydrocephalus,extra-axial collection or mass lesion/mass effect. There is dilatation the ventricles and sulci consistent with age-related atrophy. Low-attenuation changes in the deep white matter consistent with small vessel ischemia. Vascular: No hyperdense vessel or unexpected calcification. Skull: The skull is intact. No fracture or focal lesion identified. Sinuses/Orbits: The visualized paranasal sinuses and mastoid air cells are clear. The orbits and globes intact. Other: None IMPRESSION: No acute intracranial abnormality. Findings consistent with age related atrophy and chronic small vessel ischemia Electronically Signed   By: Prudencio Pair M.D.   On: 08/08/2019 22:21   Dg Chest Portable 1 View  Result Date: 08/08/2019 CLINICAL DATA:  Fever EXAM: PORTABLE CHEST 1 VIEW COMPARISON:  Portable exam 1954 hours compared to 05/08/2018 FINDINGS: Upper normal heart size. Mediastinal contours and pulmonary vascularity normal. Lungs clear. No infiltrate, pleural effusion or pneumothorax. No acute osseous findings. IMPRESSION: No acute abnormalities. Electronically Signed   By: Lavonia Dana M.D.   On: 08/08/2019 20:02   Dg Foot Complete Right  Result Date: 08/08/2019 CLINICAL DATA:  Toe ulcer fever EXAM: RIGHT FOOT COMPLETE - 3+ VIEW COMPARISON:  None. FINDINGS: There is significant dorsal soft tissue swelling seen. There is some limited visualization of the distal phalanges due to positioning. No definite osseous fracture or area of cortical destruction. Bipartite medial sesamoid is seen. Tiny calcaneal enthesophyte noted. IMPRESSION: Dorsal soft tissue swelling.  No definite evidence of osteomyelitis.  Electronically Signed   By: Prudencio Pair M.D.   On: 08/08/2019 20:15       Assessment & Plan:    Principal Problem:   Cellulitis of foot Active Problems:   Fever   Hypokalemia  Hyponatremia   Essential hypertension  Cellulitis of the right foot  Blood culture x2 vanco iv, cefepime iv pharmacy to dose  Leukocytosis Likely related to cellulitis Check cbc in am  N/v Zofran 4mg  iv q6h prn  Hyponatremia Hydrate with ns iv Check cmp in am  Hypokalemia Hydrate with ns with Kcl  Check cmp in am If still low consider check magnesium  Hypothyroidism Computer says 123micrograms 1/2 po qday, but patient states taking 50 micrograms po qday  Hypertension Cont Losartan 25mg  po qday,  Pt states not 50mg  as in computer Not taking hydrochlorothiazide, taking Triamterene /hydrochlorothiazide 37.5/25mg  po qday  Chronic back pain Tramadol 50mg  po qday prn Pt states doesn't need her gabapentin.   Asthma Cont Symbicort-> Dulera 2puff bid Cont Albuterol HFA prn   Anxiety Cont Wellbutrin XR 150mg  po qday Cont Xanax 0.5mg  po qhs, and 0.5mg  po bid prn   Gerd  Cont protonix 40mg  po qday, not taking pepcid per patient  Osteoporosis Can resume Evista on discharge  DVT Prophylaxis-   Lovenox - SCDs   AM Labs Ordered, also please review Full Orders  Family Communication: Admission, patients condition and plan of care including tests being ordered have been discussed with the patient  who indicate understanding and agree with the plan and Code Status.  Code Status:  FULL CODE, per patient and her daughter.  Daughter present in ED with patient  Admission status: Observation: Based on patients clinical presentation and evaluation of above clinical data, I have made determination that patient meets observation  criteria at this time.  Time spent in minutes : 55 minutes    Jani Gravel M.D on 08/08/2019 at 11:37 PM

## 2019-08-08 NOTE — Progress Notes (Signed)
Pharmacy Antibiotic Note  [name] is a 78 y.o. female admitted on 08/08/2019 with cellulitis.  Pharmacy has been consulted for cefepime and vancomycin dosing.  Plan: Vancomycin 1500 mg loading dose, followed by vancomycin 500 mg IV every 12 hours.  Goal trough 10-15 mcg/mL.  Cefepime 2 gram IV every 12 hours Monitor clinical progress, cultures/sensitivities, renal function, abx plan, Vancomycin levels as indicated   Height: 5\' 4"  (162.6 cm) Weight: 154 lb (69.9 kg) IBW/kg (Calculated) : 54.7  Temp (24hrs), Avg:99 F (37.2 C), Min:99 F (37.2 C), Max:99 F (37.2 C)  Recent Labs  Lab 08/08/19 1932 08/08/19 2033  WBC 24.3*  --   CREATININE 0.91  --   LATICACIDVEN 1.8 1.9    Estimated Creatinine Clearance: 49.7 mL/min (by C-G formula based on SCr of 0.91 mg/dL).    Allergies  Allergen Reactions  . Amoxicillin-Pot Clavulanate Nausea And Vomiting, Other (See Comments), Itching and Nausea Only    Has patient had a PCN reaction causing immediate rash, facial/tongue/throat swelling, SOB or lightheadedness with hypotension: No Has patient had a PCN reaction causing severe rash involving mucus membranes or skin necrosis: No Has patient had a PCN reaction that required hospitalization: No Has patient had a PCN reaction occurring within the last 10 years: No If all of the above answers are "NO", then may proceed with Cephalosporin use. Has patient had a PCN reaction causing immediate rash, facial/tongue/throat swelling, SOB or lightheadedness with hypotension: No Has patient had a PCN reaction causing severe rash involving mucus membranes or skin necrosis: No Has patient had a PCN reaction that required hospitalization: No Has patient had a PCN reaction occurring within the last 10 years: No If all of the above answers are "NO", then may proceed with Cephalosporin use.   . Ace Inhibitors Cough  . Entex La Itching  . Levofloxacin Hives and Itching     Thank you for allowing  pharmacy to be a part of this patient's care.  Jens Som, PharmD 08/08/2019 10:27 PM

## 2019-08-09 LAB — COMPREHENSIVE METABOLIC PANEL
ALT: 22 U/L (ref 0–44)
AST: 18 U/L (ref 15–41)
Albumin: 3.1 g/dL — ABNORMAL LOW (ref 3.5–5.0)
Alkaline Phosphatase: 45 U/L (ref 38–126)
Anion gap: 7 (ref 5–15)
BUN: 14 mg/dL (ref 8–23)
CO2: 21 mmol/L — ABNORMAL LOW (ref 22–32)
Calcium: 8.2 mg/dL — ABNORMAL LOW (ref 8.9–10.3)
Chloride: 107 mmol/L (ref 98–111)
Creatinine, Ser: 0.8 mg/dL (ref 0.44–1.00)
GFR calc Af Amer: >60 mL/min (ref >60)
GFR calc non Af Amer: >60 mL/min (ref >60)
Glucose, Bld: 108 mg/dL — ABNORMAL HIGH (ref 70–99)
Potassium: 3.3 mmol/L — ABNORMAL LOW (ref 3.5–5.1)
Sodium: 135 mmol/L (ref 135–145)
Total Bilirubin: 0.9 mg/dL (ref 0.3–1.2)
Total Protein: 5.7 g/dL — ABNORMAL LOW (ref 6.5–8.1)

## 2019-08-09 LAB — CBC
HCT: 32.7 % — ABNORMAL LOW (ref 36.0–46.0)
Hemoglobin: 10.1 g/dL — ABNORMAL LOW (ref 12.0–15.0)
MCH: 31.3 pg (ref 26.0–34.0)
MCHC: 30.9 g/dL (ref 30.0–36.0)
MCV: 101.2 fL — ABNORMAL HIGH (ref 80.0–100.0)
Platelets: 95 10*3/uL — ABNORMAL LOW (ref 150–400)
RBC: 3.23 MIL/uL — ABNORMAL LOW (ref 3.87–5.11)
RDW: 15.1 % (ref 11.5–15.5)
WBC: 14.4 10*3/uL — ABNORMAL HIGH (ref 4.0–10.5)
nRBC: 0 % (ref 0.0–0.2)

## 2019-08-09 LAB — MRSA PCR SCREENING: MRSA by PCR: NEGATIVE

## 2019-08-09 MED ORDER — PANTOPRAZOLE SODIUM 40 MG PO TBEC
40.0000 mg | DELAYED_RELEASE_TABLET | Freq: Every day | ORAL | Status: DC
  Administered 2019-08-09 – 2019-08-11 (×3): 40 mg via ORAL
  Filled 2019-08-09 (×4): qty 1

## 2019-08-09 MED ORDER — AMLODIPINE BESYLATE 5 MG PO TABS
2.5000 mg | ORAL_TABLET | Freq: Every evening | ORAL | Status: DC
  Administered 2019-08-09: 17:00:00 2.5 mg via ORAL
  Filled 2019-08-09 (×2): qty 1

## 2019-08-09 MED ORDER — ENSURE ENLIVE PO LIQD
237.0000 mL | Freq: Two times a day (BID) | ORAL | Status: DC
  Administered 2019-08-09 – 2019-08-11 (×4): 237 mL via ORAL

## 2019-08-09 MED ORDER — DOCUSATE SODIUM 100 MG PO CAPS
200.0000 mg | ORAL_CAPSULE | Freq: Every day | ORAL | Status: DC
  Administered 2019-08-09 – 2019-08-10 (×2): 200 mg via ORAL
  Filled 2019-08-09 (×2): qty 2

## 2019-08-09 MED ORDER — TRIAMTERENE-HCTZ 37.5-25 MG PO TABS
1.0000 | ORAL_TABLET | Freq: Every day | ORAL | Status: DC
  Administered 2019-08-09: 09:00:00 1 via ORAL
  Filled 2019-08-09: qty 1

## 2019-08-09 MED ORDER — POLYETHYLENE GLYCOL 3350 17 G PO PACK
17.0000 g | PACK | Freq: Every day | ORAL | Status: DC | PRN
  Administered 2019-08-10: 15:00:00 17 g via ORAL
  Filled 2019-08-09: qty 1

## 2019-08-09 MED ORDER — MOMETASONE FURO-FORMOTEROL FUM 100-5 MCG/ACT IN AERO
2.0000 | INHALATION_SPRAY | Freq: Two times a day (BID) | RESPIRATORY_TRACT | Status: DC
  Administered 2019-08-09 – 2019-08-11 (×5): 2 via RESPIRATORY_TRACT
  Filled 2019-08-09: qty 8.8

## 2019-08-09 MED ORDER — LOSARTAN POTASSIUM 50 MG PO TABS
25.0000 mg | ORAL_TABLET | Freq: Every day | ORAL | Status: DC
  Administered 2019-08-09 – 2019-08-11 (×3): 25 mg via ORAL
  Filled 2019-08-09 (×3): qty 1

## 2019-08-09 MED ORDER — BUPROPION HCL ER (SR) 100 MG PO TB12
100.0000 mg | ORAL_TABLET | Freq: Every day | ORAL | Status: DC
  Administered 2019-08-09 – 2019-08-11 (×3): 100 mg via ORAL
  Filled 2019-08-09 (×3): qty 1

## 2019-08-09 MED ORDER — LEVOTHYROXINE SODIUM 50 MCG PO TABS
50.0000 ug | ORAL_TABLET | Freq: Every day | ORAL | Status: DC
  Administered 2019-08-09 – 2019-08-11 (×3): 50 ug via ORAL
  Filled 2019-08-09 (×3): qty 1

## 2019-08-09 MED ORDER — TRIAMTERENE-HCTZ 37.5-25 MG PO TABS
0.5000 | ORAL_TABLET | Freq: Every day | ORAL | Status: DC
  Administered 2019-08-10 – 2019-08-11 (×2): 0.5 via ORAL
  Filled 2019-08-09 (×2): qty 1

## 2019-08-09 MED ORDER — POTASSIUM CHLORIDE CRYS ER 20 MEQ PO TBCR
40.0000 meq | EXTENDED_RELEASE_TABLET | ORAL | Status: AC
  Administered 2019-08-09 (×2): 40 meq via ORAL
  Filled 2019-08-09 (×2): qty 2

## 2019-08-09 MED ORDER — ASPIRIN EC 81 MG PO TBEC
81.0000 mg | DELAYED_RELEASE_TABLET | Freq: Every day | ORAL | Status: DC
  Administered 2019-08-09 – 2019-08-11 (×3): 81 mg via ORAL
  Filled 2019-08-09 (×3): qty 1

## 2019-08-09 MED ORDER — ALPRAZOLAM 0.5 MG PO TABS
0.5000 mg | ORAL_TABLET | Freq: Two times a day (BID) | ORAL | Status: DC | PRN
  Administered 2019-08-09 – 2019-08-11 (×3): 0.5 mg via ORAL
  Filled 2019-08-09 (×4): qty 1

## 2019-08-09 MED ORDER — PANTOPRAZOLE SODIUM 40 MG PO TBEC
40.0000 mg | DELAYED_RELEASE_TABLET | Freq: Every day | ORAL | Status: DC
  Filled 2019-08-09: qty 1

## 2019-08-09 MED ORDER — ALPRAZOLAM 0.5 MG PO TABS
0.5000 mg | ORAL_TABLET | Freq: Every day | ORAL | Status: DC
  Administered 2019-08-09 – 2019-08-10 (×3): 0.5 mg via ORAL
  Filled 2019-08-09 (×2): qty 1

## 2019-08-09 MED ORDER — ONDANSETRON HCL 4 MG/2ML IJ SOLN
4.0000 mg | Freq: Four times a day (QID) | INTRAMUSCULAR | Status: DC | PRN
  Administered 2019-08-09: 02:00:00 4 mg via INTRAVENOUS
  Filled 2019-08-09: qty 2

## 2019-08-09 MED ORDER — ALBUTEROL SULFATE (2.5 MG/3ML) 0.083% IN NEBU: 2.5000 mg | INHALATION_SOLUTION | Freq: Four times a day (QID) | RESPIRATORY_TRACT | Status: DC | PRN

## 2019-08-09 MED ORDER — TRAMADOL HCL 50 MG PO TABS
50.0000 mg | ORAL_TABLET | Freq: Every day | ORAL | Status: DC | PRN
  Administered 2019-08-09 – 2019-08-11 (×2): 50 mg via ORAL
  Filled 2019-08-09 (×2): qty 1

## 2019-08-09 NOTE — Progress Notes (Signed)
Patient Demographics:    [name], is a 78 y.o. female, DOB - 10-20-1941, [id number]  Admit date - 08/08/2019   Admitting Physician Jani Gravel, MD  Outpatient Primary MD for the patient is [name], MD  LOS - 0   Chief Complaint  Patient presents with   Fever   Headache        Subjective:    [name] today has no further fevers, no further emesis,  No chest pain,  --- daughter at bedside, complains of increasing right leg pain, redness extending into the thigh and groin area,  Assessment  & Plan :    Principal Problem:   Cellulitis of foot Active Problems:   Fever   Hypokalemia   Hyponatremia   Essential hypertension  Brief Summary:- 78 y.o. female, w hypertension, hypothyroidism, asthma, anxiety, gerd, apparently presents with skin ulcer on the right big toe for the past 1 week, now has worsening right foot redness and fever to 102, and dry heaves and chills -admitted with sepsis secondary to right lower extremity cellulitis with white count of 24K  A/p 1)Right foot and right lower extremity cellulitis----patient with sepsis secondary to severe cellulitis --- patient had fevers up to 102, leukocytosis with WBC over 24k, significant redness, streaking and induration extending from the foot all the way into the groin medially--- please see photos in epic -WBC is down to 14.4 from 24.3K, fever curve is trending down, -Continue vancomycin and cefepime pending culture results -Check MRSA PCR -Sepsis pathophysiology resolving  2)Hypokalemia/HypoNatremia--- due to Maxzide use, decrease Maxide to 0.5 tab daily, replace and recheck electrolytes  3)HTN--- stable, continue amlodipine 2.5 mg daily, continue losartan 25 mg daily, decrease Maxzide to 0.5 tablets daily  4)Asthma-stable, continue bronchodilators  5)Hypothyroidism--stable, continue levothyroxine 50 mcg  daily  6)Chronic back pain Continue tramadol 50mg  po qday prn and gabapentin  7)Anxiety Cont Wellbutrin XR 150mg  po qday Cont Xanax 0.5mg  po qhs, and 0.5mg  po bid prn   8)Gerd  Cont protonix 40mg  po qday,    9)Osteoporosis Can resume Evista on discharge  DVT Prophylaxis-   Lovenox - SCDs   Disposition/Need for in-Hospital Stay- patient unable to be discharged at this time due to sepsis requiring IV antibiotics pending culture results*  Code Status :   Family Communication:   NA (patient is alert, awake and coherent) --Discussed with daughter Arbie Cookey at bedside  Disposition Plan  : Home in 1 to 2 days if continues to improve  Consults  :  NA   Lab Results  Component Value Date   PLT 95 (L) 08/09/2019   Inpatient Medications  Scheduled Meds:  ALPRAZolam  0.5 mg Oral QHS   amLODipine  2.5 mg Oral QPM   aspirin EC  81 mg Oral Daily   buPROPion  100 mg Oral Daily   enoxaparin (LOVENOX) injection  40 mg Subcutaneous Q24H   feeding supplement (ENSURE ENLIVE)  237 mL Oral BID BM   levothyroxine  50 mcg Oral Q0600   losartan  25 mg Oral Daily   mometasone-formoterol  2 puff Inhalation BID   pantoprazole  40 mg Oral Q0600   triamterene-hydrochlorothiazide  1 tablet Oral Daily   Continuous Infusions:  ceFEPime (MAXIPIME) IV 2 g (  08/09/19 0917)   vancomycin 500 mg (08/09/19 1056)   PRN Meds:.acetaminophen **OR** acetaminophen, albuterol, ALPRAZolam, ondansetron (ZOFRAN) IV, traMADol    Anti-infectives (From admission, onward)   Start     Dose/Rate Route Frequency Ordered Stop   08/09/19 1100  vancomycin (VANCOCIN) 500 mg in sodium chloride 0.9 % 100 mL IVPB     500 mg 100 mL/hr over 60 Minutes Intravenous Every 12 hours 08/08/19 2236     08/09/19 1000  ceFEPIme (MAXIPIME) 2 g in sodium chloride 0.9 % 100 mL IVPB     2 g 200 mL/hr over 30 Minutes Intravenous Every 12 hours 08/08/19 2234     08/08/19 2245  vancomycin (VANCOCIN) 1,500 mg in sodium  chloride 0.9 % 500 mL IVPB     1,500 mg 250 mL/hr over 120 Minutes Intravenous  Once 08/08/19 2232 08/09/19 0337   08/08/19 2230  ceFEPIme (MAXIPIME) 2 g in sodium chloride 0.9 % 100 mL IVPB     2 g 200 mL/hr over 30 Minutes Intravenous  Once 08/08/19 2226 08/08/19 2333   08/08/19 2230  metroNIDAZOLE (FLAGYL) IVPB 500 mg     500 mg 100 mL/hr over 60 Minutes Intravenous  Once 08/08/19 2226 08/09/19 0111   08/08/19 2230  vancomycin (VANCOCIN) IVPB 1000 mg/200 mL premix  Status:  Discontinued     1,000 mg 200 mL/hr over 60 Minutes Intravenous  Once 08/08/19 2226 08/08/19 2232        Objective:   Vitals:   08/09/19 0122 08/09/19 0548 08/09/19 0951 08/09/19 1324  BP: 138/78 124/71  107/61  Pulse: 93 88  77  Resp:  18  18  Temp: (!) 100.8 F (38.2 C) (!) 100.7 F (38.2 C)  97.6 F (36.4 C)  TempSrc: Oral Oral    SpO2: 98% 97% 95% 97%  Weight:  71.1 kg    Height:        Wt Readings from Last 3 Encounters:  08/09/19 71.1 kg  12/30/18 74.4 kg  09/24/18 76.3 kg    Intake/Output Summary (Last 24 hours) at 08/09/2019 1544 Last data filed at 08/09/2019 1300 Gross per 24 hour  Intake 1112.35 ml  Output --  Net 1112.35 ml    Physical Exam Gen:- Awake Alert,   HEENT:- [place].AT, No sclera icterus Neck-Supple Neck,No JVD,.  Lungs-  CTAB , fair symmetrical air movement CV- S1, S2 normal, regular  Abd-  +ve B.Sounds, Abd Soft, No tenderness,    Extremity/Skin:-  +ve  edema, pedal pulses present , -significant redness, streaking and induration extending from the foot all the way into the groin medially--- please see photos in epic Psych-affect is appropriate, oriented x3 Neuro-no new focal deficits, no tremors   Data Review:   Micro Results Recent Results (from the past 240 hour(s))  Culture, blood (Routine x 2)     Status: None (Preliminary result)   Collection Time: 08/08/19  7:33 PM   Specimen: Left Antecubital; Blood  Result Value Ref Range Status   Specimen Description LEFT  ANTECUBITAL  Final   Special Requests   Final    BOTTLES DRAWN AEROBIC AND ANAEROBIC Blood Culture adequate volume Performed at [facility], [address] [place], [place] [address]    Culture PENDING  Incomplete   Report Status PENDING  Incomplete  SARS Coronavirus 2 [facility] order, Performed in [facility] hospital lab) Nasopharyngeal Nasopharyngeal Swab     Status: None   Collection Time: 08/08/19  7:46 PM   Specimen: Nasopharyngeal Swab  Result Value Ref Range Status   SARS Coronavirus 2 NEGATIVE NEGATIVE Final    Comment: (NOTE) If result is NEGATIVE SARS-CoV-2 target nucleic acids are NOT DETECTED. The SARS-CoV-2 RNA is generally detectable in upper and lower  respiratory specimens during the acute phase of infection. The lowest  concentration of SARS-CoV-2 viral copies this assay can detect is 250  copies / mL. A negative result does not preclude SARS-CoV-2 infection  and should not be used as the sole basis for treatment or other  patient management decisions.  A negative result may occur with  improper specimen collection / handling, submission of specimen other  than nasopharyngeal swab, presence of viral mutation(s) within the  areas targeted by this assay, and inadequate number of viral copies  (<250 copies / mL). A negative result must be combined with clinical  observations, patient history, and epidemiological information. If result is POSITIVE SARS-CoV-2 target nucleic acids are DETECTED. The SARS-CoV-2 RNA is generally detectable in upper and lower  respiratory specimens dur ing the acute phase of infection.  Positive  results are indicative of active infection with SARS-CoV-2.  Clinical  correlation with patient history and other diagnostic information is  necessary to determine patient infection status.  Positive results do  not rule out bacterial infection or co-infection with other viruses. If result is PRESUMPTIVE POSTIVE SARS-CoV-2 nucleic acids MAY BE  PRESENT.   A presumptive positive result was obtained on the submitted specimen  and confirmed on repeat testing.  While 2019 novel coronavirus  (SARS-CoV-2) nucleic acids may be present in the submitted sample  additional confirmatory testing may be necessary for epidemiological  and / or clinical management purposes  to differentiate between  SARS-CoV-2 and other Sarbecovirus currently known to infect humans.  If clinically indicated additional testing with an alternate test  methodology [phone]) is advised. The SARS-CoV-2 RNA is generally  detectable in upper and lower respiratory sp ecimens during the acute  phase of infection. The expected result is Negative. Fact Sheet for Patients:  StrictlyIdeas.no Fact Sheet for Healthcare Providers: BankingDealers.co.za This test is not yet approved or cleared by the [place] FDA and has been authorized for detection and/or diagnosis of SARS-CoV-2 by FDA under an Emergency Use Authorization (EUA).  This EUA will remain in effect (meaning this test can be used) for the duration of the COVID-19 declaration under Section 564(b)(1) of the Act, 21 U.S.C. section 360bbb-3(b)(1), unless the authorization is terminated or revoked sooner. Performed at [facility], [address] [place], [place] [address]   Culture, blood (Routine x 2)     Status: None (Preliminary result)   Collection Time: 08/08/19  8:33 PM   Specimen: Right Antecubital; Blood  Result Value Ref Range Status   Specimen Description RIGHT ANTECUBITAL  Final   Special Requests   Final    BOTTLES DRAWN AEROBIC AND ANAEROBIC Blood Culture adequate volume   Culture   Final    NO GROWTH < 12 HOURS Performed at [facility], [address] [place], [place] [address]    Report Status PENDING  Incomplete    Radiology Reports Ct Head Wo Contrast  Result Date: 08/08/2019 CLINICAL DATA:  Headache, acute EXAM: CT HEAD WITHOUT CONTRAST  TECHNIQUE: Contiguous axial images were obtained from the base of the skull through the vertex without intravenous contrast. COMPARISON:  MRI 2011 FINDINGS: Brain: No evidence of acute territorial infarction, hemorrhage, hydrocephalus,extra-axial collection or mass lesion/mass effect. There is dilatation the ventricles and sulci consistent with  age-related atrophy. Low-attenuation changes in the deep white matter consistent with small vessel ischemia. Vascular: No hyperdense vessel or unexpected calcification. Skull: The skull is intact. No fracture or focal lesion identified. Sinuses/Orbits: The visualized paranasal sinuses and mastoid air cells are clear. The orbits and globes intact. Other: None IMPRESSION: No acute intracranial abnormality. Findings consistent with age related atrophy and chronic small vessel ischemia Electronically Signed   By: Prudencio Pair M.D.   On: 08/08/2019 22:21   Dg Chest Portable 1 View  Result Date: 08/08/2019 CLINICAL DATA:  Fever EXAM: PORTABLE CHEST 1 VIEW COMPARISON:  Portable exam 1954 hours compared to 05/08/2018 FINDINGS: Upper normal heart size. Mediastinal contours and pulmonary vascularity normal. Lungs clear. No infiltrate, pleural effusion or pneumothorax. No acute osseous findings. IMPRESSION: No acute abnormalities. Electronically Signed   By: Lavonia Dana M.D.   On: 08/08/2019 20:02   Dg Foot Complete Right  Result Date: 08/08/2019 CLINICAL DATA:  Toe ulcer fever EXAM: RIGHT FOOT COMPLETE - 3+ VIEW COMPARISON:  None. FINDINGS: There is significant dorsal soft tissue swelling seen. There is some limited visualization of the distal phalanges due to positioning. No definite osseous fracture or area of cortical destruction. Bipartite medial sesamoid is seen. Tiny calcaneal enthesophyte noted. IMPRESSION: Dorsal soft tissue swelling.  No definite evidence of osteomyelitis. Electronically Signed   By: Prudencio Pair M.D.   On: 08/08/2019 20:15     CBC Recent Labs  Lab  08/08/19 1932 08/09/19 0419  WBC 24.3* 14.4*  HGB 12.1 10.1*  HCT 37.2 32.7*  PLT 173 95*  MCV 96.9 101.2*  MCH 31.5 31.3  MCHC 32.5 30.9  RDW 14.8 15.1  LYMPHSABS 0.8  --   MONOABS 1.0  --   EOSABS 0.0  --   BASOSABS 0.0  --     Chemistries  Recent Labs  Lab 08/08/19 1932 08/09/19 0419  NA 132* 135  K 3.3* 3.3*  CL 96* 107  CO2 25 21*  GLUCOSE 144* 108*  BUN 17 14  CREATININE 0.91 0.80  CALCIUM 9.2 8.2*  AST 28 18  ALT 27 22  ALKPHOS 58 45  BILITOT 0.6 0.9   ------------------------------------------------------------------------------------------------------------------ No results for input(s): CHOL, HDL, LDLCALC, TRIG, CHOLHDL, LDLDIRECT in the last 72 hours.  No results found for: HGBA1C ------------------------------------------------------------------------------------------------------------------ No results for input(s): TSH, T4TOTAL, T3FREE, THYROIDAB in the last 72 hours.  Invalid input(s): FREET3 ------------------------------------------------------------------------------------------------------------------ No results for input(s): VITAMINB12, FOLATE, FERRITIN, TIBC, IRON, RETICCTPCT in the last 72 hours.  Coagulation profile Recent Labs  Lab 08/08/19 1932  INR 1.1    No results for input(s): DDIMER in the last 72 hours.  Cardiac Enzymes No results for input(s): CKMB, TROPONINI, MYOGLOBIN in the last 168 hours.  Invalid input(s): CK ------------------------------------------------------------------------------------------------------------------ No results found for: BNP   Roxan Hockey M.D on 08/09/2019 at 3:44 PM  Go to [email or web address] - for contact info  [facility] - Office  [phone]

## 2019-08-09 NOTE — Care Management Obs Status (Signed)
[facility] NOTIFICATION   Patient Details  Name: [name] MRN: [id number] Date of Birth: December 03, 1940   Medicare Observation Status Notification Given:  Yes    Tommy Medal 08/09/2019, 12:36 PM

## 2019-08-09 NOTE — Progress Notes (Addendum)
Initial Nutrition Assessment  DOCUMENTATION CODES:      INTERVENTION:  Ensure Enlive po BID, each supplement provides 350 kcal and 20 grams of protein    NUTRITION DIAGNOSIS:   Increased nutrient needs related to acute illness(right great toe cellutitis and sepsis) as evidenced by estimated needs.   GOAL:   Patient will meet greater than or equal to 90% of their needs  MONITOR:   PO intake, Supplement acceptance, Labs, Weight trends, Skin  REASON FOR ASSESSMENT:   Malnutrition Screening Tool    ASSESSMENT: Patient is a 78 yo female who presents with cellulitis of right great toe with sepsis. PMH: HTN, Anemia, Constipation, GERD and HTN. Hypokalemia.   Appetite is fair. Meal intake 50% of 2 meals. Feeds herself. Increased energy and protein needs with inflammation. Will oral supplement while appetite is sub par.  Patient has been weighing 71-74 kg since January. No significant wt changes noted.   Labs: WBC's 14.4 (H), Hgb 10.1 (L), Alb. 3.1 (L) BMP Latest Ref Rng & Units 08/09/2019 08/08/2019 09/24/2018  Glucose 70 - 99 mg/dL 108(H) 144(H) 94  BUN 8 - 23 mg/dL 14 17 13   Creatinine 0.44 - 1.00 mg/dL 0.80 0.91 0.80  BUN/Creat Ratio 12 - 28 - - 16  Sodium 135 - 145 mmol/L 135 132(L) 141  Potassium 3.5 - 5.1 mmol/L 3.3(L) 3.3(L) 3.2(L)  Chloride 98 - 111 mmol/L 107 96(L) 100  CO2 22 - 32 mmol/L 21(L) 25 26  Calcium 8.9 - 10.3 mg/dL 8.2(L) 9.2 9.4     NUTRITION - FOCUSED PHYSICAL EXAM:  Unable to complete Nutrition-Focused physical exam at this time.     Diet Order:   Diet Order            Diet Heart Room service appropriate? Yes; Fluid consistency: Thin  Diet effective now              EDUCATION NEEDS:   Skin:  Skin Assessment: Skin Integrity Issues: Skin Integrity Issues:: Other (Comment) Other: right great toe cellulitis  Last BM:  unknown  Height:   Ht Readings from Last 1 Encounters:  08/08/19 5\' 4"  (1.626 m)    Weight:   Wt Readings from Last  1 Encounters:  08/09/19 71.1 kg    Ideal Body Weight:  55 kg  BMI:  Body mass index is 26.91 kg/m.  Estimated Nutritional Needs:   Kcal:  1419-1660 (MSJ x 1.2-1.4 AF)  Protein:  72-83 gr (1.3-1.5 gr/kg/ibw)  Fluid:  1.4-1.7 liters daily   Colman Cater MS,RD,CSG,LDN Office: [phone] Pager: [phone]

## 2019-08-10 DIAGNOSIS — M810 Age-related osteoporosis without current pathological fracture: Secondary | ICD-10-CM | POA: Diagnosis present

## 2019-08-10 DIAGNOSIS — Z8249 Family history of ischemic heart disease and other diseases of the circulatory system: Secondary | ICD-10-CM | POA: Diagnosis not present

## 2019-08-10 DIAGNOSIS — J45909 Unspecified asthma, uncomplicated: Secondary | ICD-10-CM | POA: Diagnosis present

## 2019-08-10 DIAGNOSIS — Z20828 Contact with and (suspected) exposure to other viral communicable diseases: Secondary | ICD-10-CM | POA: Diagnosis present

## 2019-08-10 DIAGNOSIS — L97519 Non-pressure chronic ulcer of other part of right foot with unspecified severity: Secondary | ICD-10-CM | POA: Diagnosis present

## 2019-08-10 DIAGNOSIS — Z880 Allergy status to penicillin: Secondary | ICD-10-CM | POA: Diagnosis not present

## 2019-08-10 DIAGNOSIS — Z825 Family history of asthma and other chronic lower respiratory diseases: Secondary | ICD-10-CM | POA: Diagnosis not present

## 2019-08-10 DIAGNOSIS — K219 Gastro-esophageal reflux disease without esophagitis: Secondary | ICD-10-CM | POA: Diagnosis present

## 2019-08-10 DIAGNOSIS — Z881 Allergy status to other antibiotic agents status: Secondary | ICD-10-CM | POA: Diagnosis not present

## 2019-08-10 DIAGNOSIS — E876 Hypokalemia: Secondary | ICD-10-CM | POA: Diagnosis present

## 2019-08-10 DIAGNOSIS — M1990 Unspecified osteoarthritis, unspecified site: Secondary | ICD-10-CM | POA: Diagnosis present

## 2019-08-10 DIAGNOSIS — Z961 Presence of intraocular lens: Secondary | ICD-10-CM | POA: Diagnosis present

## 2019-08-10 DIAGNOSIS — L03115 Cellulitis of right lower limb: Secondary | ICD-10-CM | POA: Diagnosis present

## 2019-08-10 DIAGNOSIS — T502X5A Adverse effect of carbonic-anhydrase inhibitors, benzothiadiazides and other diuretics, initial encounter: Secondary | ICD-10-CM | POA: Diagnosis present

## 2019-08-10 DIAGNOSIS — L03119 Cellulitis of unspecified part of limb: Secondary | ICD-10-CM | POA: Diagnosis present

## 2019-08-10 DIAGNOSIS — F419 Anxiety disorder, unspecified: Secondary | ICD-10-CM | POA: Diagnosis present

## 2019-08-10 DIAGNOSIS — Z9842 Cataract extraction status, left eye: Secondary | ICD-10-CM | POA: Diagnosis not present

## 2019-08-10 DIAGNOSIS — G8929 Other chronic pain: Secondary | ICD-10-CM | POA: Diagnosis present

## 2019-08-10 DIAGNOSIS — M549 Dorsalgia, unspecified: Secondary | ICD-10-CM | POA: Diagnosis present

## 2019-08-10 DIAGNOSIS — E871 Hypo-osmolality and hyponatremia: Secondary | ICD-10-CM | POA: Diagnosis present

## 2019-08-10 DIAGNOSIS — Z9841 Cataract extraction status, right eye: Secondary | ICD-10-CM | POA: Diagnosis not present

## 2019-08-10 DIAGNOSIS — Z87891 Personal history of nicotine dependence: Secondary | ICD-10-CM | POA: Diagnosis not present

## 2019-08-10 DIAGNOSIS — A419 Sepsis, unspecified organism: Secondary | ICD-10-CM | POA: Diagnosis present

## 2019-08-10 DIAGNOSIS — R509 Fever, unspecified: Secondary | ICD-10-CM | POA: Diagnosis present

## 2019-08-10 DIAGNOSIS — I10 Essential (primary) hypertension: Secondary | ICD-10-CM | POA: Diagnosis present

## 2019-08-10 DIAGNOSIS — E039 Hypothyroidism, unspecified: Secondary | ICD-10-CM | POA: Diagnosis present

## 2019-08-10 LAB — CBC
HCT: 32.8 % — ABNORMAL LOW (ref 36.0–46.0)
Hemoglobin: 10.2 g/dL — ABNORMAL LOW (ref 12.0–15.0)
MCH: 31 pg (ref 26.0–34.0)
MCHC: 31.1 g/dL (ref 30.0–36.0)
MCV: 99.7 fL (ref 80.0–100.0)
Platelets: 131 10*3/uL — ABNORMAL LOW (ref 150–400)
RBC: 3.29 MIL/uL — ABNORMAL LOW (ref 3.87–5.11)
RDW: 15.2 % (ref 11.5–15.5)
WBC: 8.1 10*3/uL (ref 4.0–10.5)
nRBC: 0 % (ref 0.0–0.2)

## 2019-08-10 LAB — BASIC METABOLIC PANEL
Anion gap: 6 (ref 5–15)
BUN: 13 mg/dL (ref 8–23)
CO2: 24 mmol/L (ref 22–32)
Calcium: 8.2 mg/dL — ABNORMAL LOW (ref 8.9–10.3)
Chloride: 109 mmol/L (ref 98–111)
Creatinine, Ser: 0.76 mg/dL (ref 0.44–1.00)
GFR calc Af Amer: >60 mL/min (ref >60)
GFR calc non Af Amer: >60 mL/min (ref >60)
Glucose, Bld: 114 mg/dL — ABNORMAL HIGH (ref 70–99)
Potassium: 4.7 mmol/L (ref 3.5–5.1)
Sodium: 139 mmol/L (ref 135–145)

## 2019-08-10 LAB — MAGNESIUM: Magnesium: 2.2 mg/dL (ref 1.7–2.4)

## 2019-08-10 MED ORDER — SACCHAROMYCES BOULARDII 250 MG PO CAPS
250.0000 mg | ORAL_CAPSULE | Freq: Two times a day (BID) | ORAL | Status: DC
  Administered 2019-08-10 – 2019-08-11 (×2): 250 mg via ORAL
  Filled 2019-08-10 (×2): qty 1

## 2019-08-10 NOTE — Progress Notes (Signed)
Patient Demographics:    [name], is a 78 y.o. female, DOB - 1941-05-26, [id number]  Admit date - 08/08/2019   Admitting Physician Jani Gravel, MD  Outpatient Primary MD for the patient is [name], MD  LOS - 0   Chief Complaint  Patient presents with  . Fever  . Headache        Subjective:    [name] reports less pain and redness in right leg and foot however right thigh remains very painful and redness persists.    Assessment  & Plan :    Principal Problem:   Cellulitis of foot Active Problems:   Fever   Hypokalemia   Hyponatremia   Essential hypertension  Brief Summary: 78 y.o. female, w hypertension, hypothyroidism, asthma, anxiety, gerd, apparently presents with skin ulcer on the right big toe for the past 1 week, now has worsening right foot redness and fever to 102, and dry heaves and chills -admitted with sepsis secondary to right lower extremity cellulitis with white count of 24K  A/p 1)Sepsis secondary to right foot and right lower extremity cellulitis----patient presented with sepsis secondary to severe cellulitis which has resolved, sepsis physiology has resolved --- patient presented with fevers up to 102, leukocytosis with WBC over 24k, significant redness, streaking and induration extending from the foot all the way into the groin medially -WBC is down to 8 from 24.3K, fever curve is resolved.  -Continue vancomycin. DC cefepime. Plan is to discharge home tomorrow on oral antibiotics.   2)Hypokalemia/HypoNatremia--- Corrected:  Secondary to thiazide diuretics, decreased Maxide to 0.5 tab daily, replace and recheck electrolytes  3)HTN--- stable, continue amlodipine 2.5 mg daily, continue losartan 25 mg daily, decreased Maxzide to 0.5 tablets daily  4)Asthma-stable, continue bronchodilators  5)Hypothyroidism--stable, continue levothyroxine 50 mcg daily   6)Chronic back pain Continue tramadol 50mg  po qday prn and gabapentin  7)Anxiety Cont Wellbutrin XR 150mg  po qday Cont Xanax 0.5mg  po qhs, and 0.5mg  po bid prn   8)Gerd  Cont protonix 40mg  po qday,    9)Osteoporosis Can resume Evista on discharge  DVT Prophylaxis-   Lovenox - SCDs   Disposition/Need for in-Hospital Stay- continue IV antibiotics 08/10/19, plan to tentatively transition to oral antibiotics 08/11/19   Code Status :  Full   Family Communication: --Discussed with daughter Arbie Cookey at bedside  Disposition Plan  : anticipating home tomorrow   Consults  :  NA   Lab Results  Component Value Date   PLT 131 (L) 08/10/2019   Inpatient Medications  Scheduled Meds: . ALPRAZolam  0.5 mg Oral QHS  . amLODipine  2.5 mg Oral QPM  . aspirin EC  81 mg Oral Daily  . buPROPion  100 mg Oral Daily  . docusate sodium  200 mg Oral QHS  . enoxaparin (LOVENOX) injection  40 mg Subcutaneous Q24H  . feeding supplement (ENSURE ENLIVE)  237 mL Oral BID BM  . levothyroxine  50 mcg Oral Q0600  . losartan  25 mg Oral Daily  . mometasone-formoterol  2 puff Inhalation BID  . pantoprazole  40 mg Oral Q0600  . triamterene-hydrochlorothiazide  0.5 tablet Oral Daily   Continuous Infusions: . vancomycin 500 mg (08/10/19 1119)   PRN Meds:.acetaminophen **OR** acetaminophen, albuterol, ALPRAZolam,  ondansetron (ZOFRAN) IV, polyethylene glycol, traMADol  Anti-infectives (From admission, onward)   Start     Dose/Rate Route Frequency Ordered Stop   08/09/19 1100  vancomycin (VANCOCIN) 500 mg in sodium chloride 0.9 % 100 mL IVPB     500 mg 100 mL/hr over 60 Minutes Intravenous Every 12 hours 08/08/19 2236     08/09/19 1000  ceFEPIme (MAXIPIME) 2 g in sodium chloride 0.9 % 100 mL IVPB  Status:  Discontinued     2 g 200 mL/hr over 30 Minutes Intravenous Every 12 hours 08/08/19 2234 08/10/19 1233   08/08/19 2245  vancomycin (VANCOCIN) 1,500 mg in sodium chloride 0.9 % 500 mL IVPB     1,500 mg  250 mL/hr over 120 Minutes Intravenous  Once 08/08/19 2232 08/09/19 0337   08/08/19 2230  ceFEPIme (MAXIPIME) 2 g in sodium chloride 0.9 % 100 mL IVPB     2 g 200 mL/hr over 30 Minutes Intravenous  Once 08/08/19 2226 08/08/19 2333   08/08/19 2230  metroNIDAZOLE (FLAGYL) IVPB 500 mg     500 mg 100 mL/hr over 60 Minutes Intravenous  Once 08/08/19 2226 08/09/19 0111   08/08/19 2230  vancomycin (VANCOCIN) IVPB 1000 mg/200 mL premix  Status:  Discontinued     1,000 mg 200 mL/hr over 60 Minutes Intravenous  Once 08/08/19 2226 08/08/19 2232        Objective:   Vitals:   08/09/19 1908 08/09/19 2117 08/10/19 0531 08/10/19 0754  BP:  125/67 133/86   Pulse:  73 74   Resp:  16 17   Temp:  98.1 F (36.7 C) 97.8 F (36.6 C)   TempSrc:  Oral Oral   SpO2: 97% 98% 99% 95%  Weight:      Height:        Wt Readings from Last 3 Encounters:  08/09/19 71.1 kg  12/30/18 74.4 kg  09/24/18 76.3 kg    Intake/Output Summary (Last 24 hours) at 08/10/2019 1235 Last data filed at 08/10/2019 0911 Gross per 24 hour  Intake 800 ml  Output 1900 ml  Net -1100 ml   Physical Exam Gen:- Awake Alert, NAD, cooperative. Oriented x 3.  HEENT:- [place].AT, No sclera icterus Neck-Supple Neck,No JVD.   Lungs-  CTAB , fair symmetrical air movement CV- S1, S2 normal, regular  Abd-  +ve B.Sounds, Abd Soft, No tenderness,    Extremity/Skin:-  +ve  edema, pedal pulses present , -significant redness, streaking and induration extending from the foot all the way into the groin medially--- please see photos in epic Psych-affect is appropriate, oriented x3 Neuro-no new focal deficits, no tremors   Data Review:   Micro Results Recent Results (from the past 240 hour(s))  Culture, blood (Routine x 2)     Status: None (Preliminary result)   Collection Time: 08/08/19  7:33 PM   Specimen: Left Antecubital; Blood  Result Value Ref Range Status   Specimen Description LEFT ANTECUBITAL  Final   Special Requests   Final     BOTTLES DRAWN AEROBIC AND ANAEROBIC Blood Culture adequate volume   Culture   Final    NO GROWTH 2 DAYS Performed at [facility], [address], [place],  [address]    Report Status PENDING  Incomplete  SARS Coronavirus 2 [facility] order, Performed in [place] hospital lab) Nasopharyngeal Nasopharyngeal Swab     Status: None   Collection Time: 08/08/19  7:46 PM   Specimen: Nasopharyngeal Swab  Result Value Ref Range Status  SARS Coronavirus 2 NEGATIVE NEGATIVE Final    Comment: (NOTE) If result is NEGATIVE SARS-CoV-2 target nucleic acids are NOT DETECTED. The SARS-CoV-2 RNA is generally detectable in upper and lower  respiratory specimens during the acute phase of infection. The lowest  concentration of SARS-CoV-2 viral copies this assay can detect is 250  copies / mL. A negative result does not preclude SARS-CoV-2 infection  and should not be used as the sole basis for treatment or other  patient management decisions.  A negative result may occur with  improper specimen collection / handling, submission of specimen other  than nasopharyngeal swab, presence of viral mutation(s) within the  areas targeted by this assay, and inadequate number of viral copies  (<250 copies / mL). A negative result must be combined with clinical  observations, patient history, and epidemiological information. If result is POSITIVE SARS-CoV-2 target nucleic acids are DETECTED. The SARS-CoV-2 RNA is generally detectable in upper and lower  respiratory specimens dur ing the acute phase of infection.  Positive  results are indicative of active infection with SARS-CoV-2.  Clinical  correlation with patient history and other diagnostic information is  necessary to determine patient infection status.  Positive results do  not rule out bacterial infection or co-infection with other viruses. If result is PRESUMPTIVE POSTIVE SARS-CoV-2 nucleic acids MAY BE PRESENT.   A presumptive positive result  was obtained on the submitted specimen  and confirmed on repeat testing.  While 2019 novel coronavirus  (SARS-CoV-2) nucleic acids may be present in the submitted sample  additional confirmatory testing may be necessary for epidemiological  and / or clinical management purposes  to differentiate between  SARS-CoV-2 and other Sarbecovirus currently known to infect humans.  If clinically indicated additional testing with an alternate test  methodology [phone]) is advised. The SARS-CoV-2 RNA is generally  detectable in upper and lower respiratory sp ecimens during the acute  phase of infection. The expected result is Negative. Fact Sheet for Patients:  StrictlyIdeas.no Fact Sheet for Healthcare Providers: BankingDealers.co.za This test is not yet approved or cleared by the [place] FDA and has been authorized for detection and/or diagnosis of SARS-CoV-2 by FDA under an Emergency Use Authorization (EUA).  This EUA will remain in effect (meaning this test can be used) for the duration of the COVID-19 declaration under Section 564(b)(1) of the Act, 21 U.S.C. section 360bbb-3(b)(1), unless the authorization is terminated or revoked sooner. Performed at [facility], [address], [place], [place] [address]   Culture, blood (Routine x 2)     Status: None (Preliminary result)   Collection Time: 08/08/19  8:33 PM   Specimen: Right Antecubital; Blood  Result Value Ref Range Status   Specimen Description RIGHT ANTECUBITAL  Final   Special Requests   Final    BOTTLES DRAWN AEROBIC AND ANAEROBIC Blood Culture adequate volume   Culture   Final    NO GROWTH 2 DAYS Performed at [facility], [address], [place],  [address]    Report Status PENDING  Incomplete  MRSA PCR Screening     Status: None   Collection Time: 08/09/19  3:53 PM   Specimen: Nasal Mucosa; Nasopharyngeal  Result Value Ref Range Status   MRSA by PCR NEGATIVE  NEGATIVE Final    Comment:        The GeneXpert MRSA Assay (FDA approved for NASAL specimens only), is one component of a comprehensive MRSA colonization surveillance program. It is not intended to diagnose MRSA infection  nor to guide or monitor treatment for MRSA infections. Performed at [facility], [address], [place], [place] [address]     Radiology Reports Ct Head Wo Contrast  Result Date: 08/08/2019 CLINICAL DATA:  Headache, acute EXAM: CT HEAD WITHOUT CONTRAST TECHNIQUE: Contiguous axial images were obtained from the base of the skull through the vertex without intravenous contrast. COMPARISON:  MRI 2011 FINDINGS: Brain: No evidence of acute territorial infarction, hemorrhage, hydrocephalus,extra-axial collection or mass lesion/mass effect. There is dilatation the ventricles and sulci consistent with age-related atrophy. Low-attenuation changes in the deep white matter consistent with small vessel ischemia. Vascular: No hyperdense vessel or unexpected calcification. Skull: The skull is intact. No fracture or focal lesion identified. Sinuses/Orbits: The visualized paranasal sinuses and mastoid air cells are clear. The orbits and globes intact. Other: None IMPRESSION: No acute intracranial abnormality. Findings consistent with age related atrophy and chronic small vessel ischemia Electronically Signed   By: Prudencio Pair M.D.   On: 08/08/2019 22:21   Dg Chest Portable 1 View  Result Date: 08/08/2019 CLINICAL DATA:  Fever EXAM: PORTABLE CHEST 1 VIEW COMPARISON:  Portable exam 1954 hours compared to 05/08/2018 FINDINGS: Upper normal heart size. Mediastinal contours and pulmonary vascularity normal. Lungs clear. No infiltrate, pleural effusion or pneumothorax. No acute osseous findings. IMPRESSION: No acute abnormalities. Electronically Signed   By: Lavonia Dana M.D.   On: 08/08/2019 20:02   Dg Foot Complete Right  Result Date: 08/08/2019 CLINICAL DATA:  Toe ulcer fever EXAM: RIGHT FOOT  COMPLETE - 3+ VIEW COMPARISON:  None. FINDINGS: There is significant dorsal soft tissue swelling seen. There is some limited visualization of the distal phalanges due to positioning. No definite osseous fracture or area of cortical destruction. Bipartite medial sesamoid is seen. Tiny calcaneal enthesophyte noted. IMPRESSION: Dorsal soft tissue swelling.  No definite evidence of osteomyelitis. Electronically Signed   By: Prudencio Pair M.D.   On: 08/08/2019 20:15    CBC Recent Labs  Lab 08/08/19 1932 08/09/19 0419 08/10/19 0441  WBC 24.3* 14.4* 8.1  HGB 12.1 10.1* 10.2*  HCT 37.2 32.7* 32.8*  PLT 173 95* 131*  MCV 96.9 101.2* 99.7  MCH 31.5 31.3 31.0  MCHC 32.5 30.9 31.1  RDW 14.8 15.1 15.2  LYMPHSABS 0.8  --   --   MONOABS 1.0  --   --   EOSABS 0.0  --   --   BASOSABS 0.0  --   --    Chemistries  Recent Labs  Lab 08/08/19 1932 08/09/19 0419 08/10/19 0441  NA 132* 135 139  K 3.3* 3.3* 4.7  CL 96* 107 109  CO2 25 21* 24  GLUCOSE 144* 108* 114*  BUN 17 14 13   CREATININE 0.91 0.80 0.76  CALCIUM 9.2 8.2* 8.2*  MG  --   --  2.2  AST 28 18  --   ALT 27 22  --   ALKPHOS 58 45  --   BILITOT 0.6 0.9  --    ------------------------------------------------------------------------------------------------------------------ No results for input(s): CHOL, HDL, LDLCALC, TRIG, CHOLHDL, LDLDIRECT in the last 72 hours.  No results found for: HGBA1C ------------------------------------------------------------------------------------------------------------------ No results for input(s): TSH, T4TOTAL, T3FREE, THYROIDAB in the last 72 hours.  Invalid input(s): FREET3 ------------------------------------------------------------------------------------------------------------------ No results for input(s): VITAMINB12, FOLATE, FERRITIN, TIBC, IRON, RETICCTPCT in the last 72 hours.  Coagulation profile Recent Labs  Lab 08/08/19 1932  INR 1.1    No results for input(s): DDIMER in the  last 72 hours.  Cardiac Enzymes No  results for input(s): CKMB, TROPONINI, MYOGLOBIN in the last 168 hours.  Invalid input(s): CK ------------------------------------------------------------------------------------------------------------------ No results found for: BNP   Irwin Brakeman M.D on 08/10/2019 at 12:35 PM  Go to [email or web address] - for contact info  [facility] - Office  [phone]

## 2019-08-11 ENCOUNTER — Other Ambulatory Visit: Payer: Self-pay

## 2019-08-11 DIAGNOSIS — Z20822 Contact with and (suspected) exposure to covid-19: Secondary | ICD-10-CM

## 2019-08-11 MED ORDER — DOXYCYCLINE MONOHYDRATE 100 MG PO CAPS: 100.0000 mg | ORAL_CAPSULE | Freq: Two times a day (BID) | ORAL | 0 refills | Status: AC

## 2019-08-11 NOTE — Evaluation (Signed)
Physical Therapy Evaluation Patient Details Name: [name] MRN: [id number] DOB: 01/12/1941 Today's Date: 08/11/2019   History of Present Illness  [name]  is a 78 y.o. female, w hypertension, hypothyroidism, asthma, anxiety, gerd, apparently presents with skin ulcer on the right big toe for the past 1 week, now has worsening right foot redness and fever to 102, and dry heaves and chills today.  H/o skin shaving of callus at that big toe by podiatry in the past.    Clinical Impression  Patient functioning near baseline for functional mobility and gait, other than mild veering to the left/right without loss of balance during ambulation possibly due to wearing flip flops per patient, does better wearing shoes.  Plan:  Patient discharged from physical therapy to care of nursing for ambulation daily as tolerated for length of stay.     Follow Up Recommendations No PT follow up    Equipment Recommendations  None recommended by PT    Recommendations for Other Services       Precautions / Restrictions Precautions Precautions: None Restrictions Weight Bearing Restrictions: No      Mobility  Bed Mobility Overal bed mobility: Independent                Transfers Overall transfer level: Modified independent Equipment used: None                Ambulation/Gait Ambulation/Gait assistance: Modified independent (Device/Increase time) Gait Distance (Feet): 200 Feet Assistive device: None Gait Pattern/deviations: WFL(Within Functional Limits);Drifts right/left Gait velocity: slightly decreased   General Gait Details: demonstrates good return for ambulation on level, inclined and declined surfaces without loss of balance, occasionl drifting right/left without losing balance  Stairs            Wheelchair Mobility    Modified Rankin (Stroke Patients Only)       Balance Overall balance assessment: Mild deficits observed, not formally tested                                            Pertinent Vitals/Pain Pain Assessment: Faces Faces Pain Scale: Hurts a little bit Pain Location: RLE soreness when putting pressure Pain Descriptors / Indicators: Sore Pain Intervention(s): Limited activity within patient's tolerance;Monitored during session    Home Living Family/patient expects to be discharged to:: Private residence Living Arrangements: Spouse/significant other(Patient is caregiver for her spouse) Available Help at Discharge: Family;Available 24 hours/day(has 2 daughters that are helping) Type of Home: House Home Access: Stairs to enter Entrance Stairs-Rails: None Entrance Stairs-Number of Steps: 1 Home Layout: One level Home Equipment: Shower seat;Hand held shower head      Prior Function Level of Independence: Independent         Comments: Hydrographic surveyor, drives     Journalist, newspaper        Extremity/Trunk Assessment   Upper Extremity Assessment Upper Extremity Assessment: Overall WFL for tasks assessed    Lower Extremity Assessment Lower Extremity Assessment: Overall WFL for tasks assessed    Cervical / Trunk Assessment Cervical / Trunk Assessment: Normal  Communication   Communication: No difficulties  Cognition Arousal/Alertness: Awake/alert Behavior During Therapy: WFL for tasks assessed/performed Overall Cognitive Status: Within Functional Limits for tasks assessed  General Comments      Exercises     Assessment/Plan    PT Assessment Patent does not need any further PT services  PT Problem List         PT Treatment Interventions      PT Goals (Current goals can be found in the Care Plan section)  Acute Rehab PT Goals Patient Stated Goal: return home with family to assist PT Goal Formulation: With patient Time For Goal Achievement: 08/11/19 Potential to Achieve Goals: Good    Frequency     Barriers to  discharge        Co-evaluation               AM-PAC PT "6 Clicks" Mobility  Outcome Measure Help needed turning from your back to your side while in a flat bed without using bedrails?: None Help needed moving from lying on your back to sitting on the side of a flat bed without using bedrails?: None Help needed moving to and from a bed to a chair (including a wheelchair)?: None Help needed standing up from a chair using your arms (e.g., wheelchair or bedside chair)?: None Help needed to walk in hospital room?: None Help needed climbing 3-5 steps with a railing? : None 6 Click Score: 24    End of Session   Activity Tolerance: Patient tolerated treatment well Patient left: in chair;with call bell/phone within reach Nurse Communication: Mobility status PT Visit Diagnosis: Unsteadiness on feet (R26.81);Other abnormalities of gait and mobility (R26.89);Muscle weakness (generalized) (M62.81)    Time: [id number] PT Time Calculation (min) (ACUTE ONLY): 22 min   Charges:   PT Evaluation $PT Eval Moderate Complexity: 1 Mod PT Treatments $Gait Training: 8-22 mins        11:38 AM, 08/11/19 Lonell Grandchild, MPT Physical Therapist with [facility] [phone] office [phone] mobile phone

## 2019-08-11 NOTE — Discharge Summary (Signed)
Physician Discharge Summary  [name] [id number] DOB: June 08, 1941 DOA: 08/08/2019  PCP: [name], MD  Admit date: 08/08/2019  Discharge date: 08/11/2019  Admitted From:Home  Disposition:  Home  Recommendations for Outpatient Follow-up:  1. Follow up with PCP in 1-2 weeks 2. Continue on doxycycline as prescribed for 5 more days  Home Health: None  Equipment/Devices: None  Discharge Condition: Stable  CODE STATUS: Full  Diet recommendation: Heart Healthy  Brief/Interim Summary: 78 y.o.female,w hypertension, hypothyroidism, asthma, anxiety, gerd, apparently presents with skin ulceron the right big toefor the past 1 week, now has worsening right foot redness and feverto 102, and dry heaves and chills -admitted with sepsis secondary to right lower extremity cellulitis with white count of 24K.  Patient was treated with vancomycin and cefepime and then cefepime was discontinued.  No growth on blood cultures noted and significant clinical improvement overall noted.  She will be discharged today with oral doxycycline for 5 more days to complete course of treatment after having received approximately 3 days of IV antibiotics while here.  She is to remain on her usual blood pressure agents at home and use bronchodilators as needed for asthma exacerbations/symptoms.  Discharge Diagnoses:  Principal Problem:   Cellulitis of foot Active Problems:   Fever   Hypokalemia   Hyponatremia   Essential hypertension   Cellulitis, leg  Principal discharge diagnosis: Sepsis secondary to right lower extremity cellulitis.  Discharge Instructions  Discharge Instructions    Diet - low sodium heart healthy   Complete by: As directed    Increase activity slowly   Complete by: As directed      Allergies as of 08/11/2019      Reactions   Amoxicillin-pot Clavulanate Nausea And Vomiting, Other (See Comments), Itching, Nausea Only   Has patient had a PCN reaction causing immediate rash,  facial/tongue/throat swelling, SOB or lightheadedness with hypotension: No Has patient had a PCN reaction causing severe rash involving mucus membranes or skin necrosis: No Has patient had a PCN reaction that required hospitalization: No Has patient had a PCN reaction occurring within the last 10 years: No If all of the above answers are "NO", then may proceed with Cephalosporin use. Has patient had a PCN reaction causing immediate rash, facial/tongue/throat swelling, SOB or lightheadedness with hypotension: No Has patient had a PCN reaction causing severe rash involving mucus membranes or skin necrosis: No Has patient had a PCN reaction that required hospitalization: No Has patient had a PCN reaction occurring within the last 10 years: No If all of the above answers are "NO", then may proceed with Cephalosporin use.   Ace Inhibitors Cough   Entex La Itching   Levofloxacin Hives, Itching      Medication List    STOP taking these medications   hydrochlorothiazide 25 MG tablet Commonly known as: HYDRODIURIL     TAKE these medications   amLODipine 2.5 MG tablet Commonly known as: NORVASC Take 2.5 mg by mouth every evening.   aspirin EC 81 MG tablet Take 81 mg by mouth daily.   buPROPion 100 MG 12 hr tablet Commonly known as: WELLBUTRIN SR Take 100 mg by mouth daily.   doxycycline 100 MG capsule Commonly known as: MONODOX Take 1 capsule (100 mg total) by mouth 2 (two) times daily for 5 days.   EPINEPHrine 0.3 mg/0.3 mL Soaj injection Commonly known as: EPI-PEN Inject 0.3 mLs (0.3 mg total) into the muscle once as needed for anaphylaxis.   famotidine 20 MG  tablet Commonly known as: PEPCID TAKE (1) TABLET DAILY AT BEDTIME.   gabapentin 100 MG capsule Commonly known as: Neurontin Take 1 capsule (100 mg total) by mouth every 8 (eight) hours as needed. What changed:   when to take this  reasons to take this   levothyroxine 125 MCG tablet Commonly known as:  SYNTHROID Take 67.5 mcg by mouth daily before breakfast.   losartan 50 MG tablet Commonly known as: COZAAR Take 50 mg by mouth daily.   multivitamin tablet Take 1 tablet by mouth daily.   pantoprazole 40 MG tablet Commonly known as: PROTONIX TAKE 1 TABLET BY MOUTH ONCE DAILY 30 TO 60 MINUTES BEFORE FIRST MEAL OF THE DAY.   pravastatin 10 MG tablet Commonly known as: PRAVACHOL Take 10 mg by mouth daily.   raloxifene 60 MG tablet Commonly known as: EVISTA TAKE 1 TABLET BY MOUTH EVERY MORNING. What changed:   how much to take  when to take this   Symbicort 80-4.5 MCG/ACT inhaler Generic drug: budesonide-formoterol INHALE 2 PUFFS INTO THE LUNGS TWICE DAILY.   traMADol 50 MG tablet Commonly known as: ULTRAM Take 1 tablet by mouth daily as needed for moderate pain or severe pain.   triamterene-hydrochlorothiazide 37.5-25 MG tablet Commonly known as: MAXZIDE-25 Take 1 tablet by mouth daily.   valACYclovir 500 MG tablet Commonly known as: VALTREX Take 500 mg by mouth daily.   Ventolin HFA 108 (90 Base) MCG/ACT inhaler Generic drug: albuterol Inhale 2 puffs into the lungs every 6 (six) hours as needed for wheezing or shortness of breath.   Xanax 0.5 MG tablet Generic drug: ALPRAZolam Take 0.5 mg by mouth at bedtime.      Follow-up Information    [name], MD Follow up in 1 week(s).   Specialty: Internal Medicine Contact information: [place] [address] [phone]          Allergies  Allergen Reactions  . Amoxicillin-Pot Clavulanate Nausea And Vomiting, Other (See Comments), Itching and Nausea Only    Has patient had a PCN reaction causing immediate rash, facial/tongue/throat swelling, SOB or lightheadedness with hypotension: No Has patient had a PCN reaction causing severe rash involving mucus membranes or skin necrosis: No Has patient had a PCN reaction that required hospitalization: No Has patient had a PCN reaction occurring  within the last 10 years: No If all of the above answers are "NO", then may proceed with Cephalosporin use. Has patient had a PCN reaction causing immediate rash, facial/tongue/throat swelling, SOB or lightheadedness with hypotension: No Has patient had a PCN reaction causing severe rash involving mucus membranes or skin necrosis: No Has patient had a PCN reaction that required hospitalization: No Has patient had a PCN reaction occurring within the last 10 years: No If all of the above answers are "NO", then may proceed with Cephalosporin use.   . Ace Inhibitors Cough  . Entex La Itching  . Levofloxacin Hives and Itching    Consultations:  None   Procedures/Studies: Ct Head Wo Contrast  Result Date: 08/08/2019 CLINICAL DATA:  Headache, acute EXAM: CT HEAD WITHOUT CONTRAST TECHNIQUE: Contiguous axial images were obtained from the base of the skull through the vertex without intravenous contrast. COMPARISON:  MRI 2011 FINDINGS: Brain: No evidence of acute territorial infarction, hemorrhage, hydrocephalus,extra-axial collection or mass lesion/mass effect. There is dilatation the ventricles and sulci consistent with age-related atrophy. Low-attenuation changes in the deep white matter consistent with small vessel ischemia. Vascular: No hyperdense vessel or  unexpected calcification. Skull: The skull is intact. No fracture or focal lesion identified. Sinuses/Orbits: The visualized paranasal sinuses and mastoid air cells are clear. The orbits and globes intact. Other: None IMPRESSION: No acute intracranial abnormality. Findings consistent with age related atrophy and chronic small vessel ischemia Electronically Signed   By: Prudencio Pair M.D.   On: 08/08/2019 22:21   Dg Chest Portable 1 View  Result Date: 08/08/2019 CLINICAL DATA:  Fever EXAM: PORTABLE CHEST 1 VIEW COMPARISON:  Portable exam 1954 hours compared to 05/08/2018 FINDINGS: Upper normal heart size. Mediastinal contours and pulmonary  vascularity normal. Lungs clear. No infiltrate, pleural effusion or pneumothorax. No acute osseous findings. IMPRESSION: No acute abnormalities. Electronically Signed   By: Lavonia Dana M.D.   On: 08/08/2019 20:02   Dg Foot Complete Right  Result Date: 08/08/2019 CLINICAL DATA:  Toe ulcer fever EXAM: RIGHT FOOT COMPLETE - 3+ VIEW COMPARISON:  None. FINDINGS: There is significant dorsal soft tissue swelling seen. There is some limited visualization of the distal phalanges due to positioning. No definite osseous fracture or area of cortical destruction. Bipartite medial sesamoid is seen. Tiny calcaneal enthesophyte noted. IMPRESSION: Dorsal soft tissue swelling.  No definite evidence of osteomyelitis. Electronically Signed   By: Prudencio Pair M.D.   On: 08/08/2019 20:15     Discharge Exam: Vitals:   08/10/19 2133 08/11/19 0748  BP: 134/87   Pulse: 84   Resp: 17   Temp: 98.5 F (36.9 C)   SpO2: 97% 97%   Vitals:   08/10/19 1916 08/10/19 2133 08/11/19 0500 08/11/19 0748  BP:  134/87    Pulse:  84    Resp:  17    Temp:  98.5 F (36.9 C)    TempSrc:  Oral    SpO2: 99% 97%  97%  Weight:   72.3 kg   Height:        General: Pt is alert, awake, not in acute distress Cardiovascular: RRR, S1/S2 +, no rubs, no gallops Respiratory: CTA bilaterally, no wheezing, no rhonchi Abdominal: Soft, NT, ND, bowel sounds + Extremities: no edema, no cyanosis, no further erythema noted.    The results of significant diagnostics from this hospitalization (including imaging, microbiology, ancillary and laboratory) are listed below for reference.     Microbiology: Recent Results (from the past 240 hour(s))  Culture, blood (Routine x 2)     Status: None (Preliminary result)   Collection Time: 08/08/19  7:33 PM   Specimen: Left Antecubital; Blood  Result Value Ref Range Status   Specimen Description LEFT ANTECUBITAL  Final   Special Requests   Final    BOTTLES DRAWN AEROBIC AND ANAEROBIC Blood Culture  adequate volume   Culture   Final    NO GROWTH 3 DAYS Performed at [facility], [address] [place], [place] [address]    Report Status PENDING  Incomplete  SARS Coronavirus 2 [facility] order, Performed in [place] hospital lab) Nasopharyngeal Nasopharyngeal Swab     Status: None   Collection Time: 08/08/19  7:46 PM   Specimen: Nasopharyngeal Swab  Result Value Ref Range Status   SARS Coronavirus 2 NEGATIVE NEGATIVE Final    Comment: (NOTE) If result is NEGATIVE SARS-CoV-2 target nucleic acids are NOT DETECTED. The SARS-CoV-2 RNA is generally detectable in upper and lower  respiratory specimens during the acute phase of infection. The lowest  concentration of SARS-CoV-2 viral copies this assay can detect is 250  copies / mL. A negative result does not  preclude SARS-CoV-2 infection  and should not be used as the sole basis for treatment or other  patient management decisions.  A negative result may occur with  improper specimen collection / handling, submission of specimen other  than nasopharyngeal swab, presence of viral mutation(s) within the  areas targeted by this assay, and inadequate number of viral copies  (<250 copies / mL). A negative result must be combined with clinical  observations, patient history, and epidemiological information. If result is POSITIVE SARS-CoV-2 target nucleic acids are DETECTED. The SARS-CoV-2 RNA is generally detectable in upper and lower  respiratory specimens dur ing the acute phase of infection.  Positive  results are indicative of active infection with SARS-CoV-2.  Clinical  correlation with patient history and other diagnostic information is  necessary to determine patient infection status.  Positive results do  not rule out bacterial infection or co-infection with other viruses. If result is PRESUMPTIVE POSTIVE SARS-CoV-2 nucleic acids MAY BE PRESENT.   A presumptive positive result was obtained on the submitted specimen  and  confirmed on repeat testing.  While 2019 novel coronavirus  (SARS-CoV-2) nucleic acids may be present in the submitted sample  additional confirmatory testing may be necessary for epidemiological  and / or clinical management purposes  to differentiate between  SARS-CoV-2 and other Sarbecovirus currently known to infect humans.  If clinically indicated additional testing with an alternate test  methodology [phone]) is advised. The SARS-CoV-2 RNA is generally  detectable in upper and lower respiratory sp ecimens during the acute  phase of infection. The expected result is Negative. Fact Sheet for Patients:  StrictlyIdeas.no Fact Sheet for Healthcare Providers: BankingDealers.co.za This test is not yet approved or cleared by the [place] FDA and has been authorized for detection and/or diagnosis of SARS-CoV-2 by FDA under an Emergency Use Authorization (EUA).  This EUA will remain in effect (meaning this test can be used) for the duration of the COVID-19 declaration under Section 564(b)(1) of the Act, 21 U.S.C. section 360bbb-3(b)(1), unless the authorization is terminated or revoked sooner. Performed at [facility], [address], [place], [place] [address]   Culture, blood (Routine x 2)     Status: None (Preliminary result)   Collection Time: 08/08/19  8:33 PM   Specimen: Right Antecubital; Blood  Result Value Ref Range Status   Specimen Description RIGHT ANTECUBITAL  Final   Special Requests   Final    BOTTLES DRAWN AEROBIC AND ANAEROBIC Blood Culture adequate volume   Culture   Final    NO GROWTH 3 DAYS Performed at [facility], [address], [place], [place] [address]    Report Status PENDING  Incomplete  MRSA PCR Screening     Status: None   Collection Time: 08/09/19  3:53 PM   Specimen: Nasal Mucosa; Nasopharyngeal  Result Value Ref Range Status   MRSA by PCR NEGATIVE NEGATIVE Final    Comment:        The GeneXpert  MRSA Assay (FDA approved for NASAL specimens only), is one component of a comprehensive MRSA colonization surveillance program. It is not intended to diagnose MRSA infection nor to guide or monitor treatment for MRSA infections. Performed at [facility], [address], [place],  [address]      Labs: BNP (last 3 results) No results for input(s): BNP in the last 8760 hours. Basic Metabolic Panel: Recent Labs  Lab 08/08/19 1932 08/09/19 0419 08/10/19 0441  NA 132* 135 139  K 3.3* 3.3* 4.7  CL 96* 107 109  CO2 25 21* 24  GLUCOSE 144* 108* 114*  BUN 17 14 13   CREATININE 0.91 0.80 0.76  CALCIUM 9.2 8.2* 8.2*  MG  --   --  2.2   Liver Function Tests: Recent Labs  Lab 08/08/19 1932 08/09/19 0419  AST 28 18  ALT 27 22  ALKPHOS 58 45  BILITOT 0.6 0.9  PROT 7.0 5.7*  ALBUMIN 4.0 3.1*   No results for input(s): LIPASE, AMYLASE in the last 168 hours. No results for input(s): AMMONIA in the last 168 hours. CBC: Recent Labs  Lab 08/08/19 1932 08/09/19 0419 08/10/19 0441  WBC 24.3* 14.4* 8.1  NEUTROABS 22.3*  --   --   HGB 12.1 10.1* 10.2*  HCT 37.2 32.7* 32.8*  MCV 96.9 101.2* 99.7  PLT 173 95* 131*   Cardiac Enzymes: No results for input(s): CKTOTAL, CKMB, CKMBINDEX, TROPONINI in the last 168 hours. BNP: Invalid input(s): POCBNP CBG: No results for input(s): GLUCAP in the last 168 hours. D-Dimer No results for input(s): DDIMER in the last 72 hours. Hgb A1c No results for input(s): HGBA1C in the last 72 hours. Lipid Profile No results for input(s): CHOL, HDL, LDLCALC, TRIG, CHOLHDL, LDLDIRECT in the last 72 hours. Thyroid function studies No results for input(s): TSH, T4TOTAL, T3FREE, THYROIDAB in the last 72 hours.  Invalid input(s): FREET3 Anemia work up No results for input(s): VITAMINB12, FOLATE, FERRITIN, TIBC, IRON, RETICCTPCT in the last 72 hours. Urinalysis    Component Value Date/Time   COLORURINE YELLOW 08/08/2019 2033   APPEARANCEUR  CLEAR 08/08/2019 2033   LABSPEC 1.017 08/08/2019 2033   PHURINE 6.0 08/08/2019 2033   GLUCOSEU NEGATIVE 08/08/2019 2033   HGBUR NEGATIVE 08/08/2019 2033   BILIRUBINUR NEGATIVE 08/08/2019 2033   [place] NEGATIVE 08/08/2019 2033   PROTEINUR NEGATIVE 08/08/2019 2033   NITRITE NEGATIVE 08/08/2019 2033   LEUKOCYTESUR NEGATIVE 08/08/2019 2033   Sepsis Labs Invalid input(s): PROCALCITONIN,  WBC,  LACTICIDVEN Microbiology Recent Results (from the past 240 hour(s))  Culture, blood (Routine x 2)     Status: None (Preliminary result)   Collection Time: 08/08/19  7:33 PM   Specimen: Left Antecubital; Blood  Result Value Ref Range Status   Specimen Description LEFT ANTECUBITAL  Final   Special Requests   Final    BOTTLES DRAWN AEROBIC AND ANAEROBIC Blood Culture adequate volume   Culture   Final    NO GROWTH 3 DAYS Performed at [facility], [address], [place], [place] [address]    Report Status PENDING  Incomplete  SARS Coronavirus 2 [facility] order, Performed in [place] hospital lab) Nasopharyngeal Nasopharyngeal Swab     Status: None   Collection Time: 08/08/19  7:46 PM   Specimen: Nasopharyngeal Swab  Result Value Ref Range Status   SARS Coronavirus 2 NEGATIVE NEGATIVE Final    Comment: (NOTE) If result is NEGATIVE SARS-CoV-2 target nucleic acids are NOT DETECTED. The SARS-CoV-2 RNA is generally detectable in upper and lower  respiratory specimens during the acute phase of infection. The lowest  concentration of SARS-CoV-2 viral copies this assay can detect is 250  copies / mL. A negative result does not preclude SARS-CoV-2 infection  and should not be used as the sole basis for treatment or other  patient management decisions.  A negative result may occur with  improper specimen collection / handling, submission of specimen other  than nasopharyngeal swab, presence of viral mutation(s) within the  areas targeted by this assay, and  inadequate number of viral copies  (<250  copies / mL). A negative result must be combined with clinical  observations, patient history, and epidemiological information. If result is POSITIVE SARS-CoV-2 target nucleic acids are DETECTED. The SARS-CoV-2 RNA is generally detectable in upper and lower  respiratory specimens dur ing the acute phase of infection.  Positive  results are indicative of active infection with SARS-CoV-2.  Clinical  correlation with patient history and other diagnostic information is  necessary to determine patient infection status.  Positive results do  not rule out bacterial infection or co-infection with other viruses. If result is PRESUMPTIVE POSTIVE SARS-CoV-2 nucleic acids MAY BE PRESENT.   A presumptive positive result was obtained on the submitted specimen  and confirmed on repeat testing.  While 2019 novel coronavirus  (SARS-CoV-2) nucleic acids may be present in the submitted sample  additional confirmatory testing may be necessary for epidemiological  and / or clinical management purposes  to differentiate between  SARS-CoV-2 and other Sarbecovirus currently known to infect humans.  If clinically indicated additional testing with an alternate test  methodology [phone]) is advised. The SARS-CoV-2 RNA is generally  detectable in upper and lower respiratory sp ecimens during the acute  phase of infection. The expected result is Negative. Fact Sheet for Patients:  StrictlyIdeas.no Fact Sheet for Healthcare Providers: BankingDealers.co.za This test is not yet approved or cleared by the [place] FDA and has been authorized for detection and/or diagnosis of SARS-CoV-2 by FDA under an Emergency Use Authorization (EUA).  This EUA will remain in effect (meaning this test can be used) for the duration of the COVID-19 declaration under Section 564(b)(1) of the Act, 21 U.S.C. section 360bbb-3(b)(1), unless the authorization is terminated or revoked  sooner. Performed at [facility], [address] [place], [place] [address]   Culture, blood (Routine x 2)     Status: None (Preliminary result)   Collection Time: 08/08/19  8:33 PM   Specimen: Right Antecubital; Blood  Result Value Ref Range Status   Specimen Description RIGHT ANTECUBITAL  Final   Special Requests   Final    BOTTLES DRAWN AEROBIC AND ANAEROBIC Blood Culture adequate volume   Culture   Final    NO GROWTH 3 DAYS Performed at [facility], [address], [place], [place] [address]    Report Status PENDING  Incomplete  MRSA PCR Screening     Status: None   Collection Time: 08/09/19  3:53 PM   Specimen: Nasal Mucosa; Nasopharyngeal  Result Value Ref Range Status   MRSA by PCR NEGATIVE NEGATIVE Final    Comment:        The GeneXpert MRSA Assay (FDA approved for NASAL specimens only), is one component of a comprehensive MRSA colonization surveillance program. It is not intended to diagnose MRSA infection nor to guide or monitor treatment for MRSA infections. Performed at [facility], [address], [place], [place] [address]      Time coordinating discharge: 35 minutes  SIGNED:   Rodena Goldmann, DO [facility] 08/11/2019, 9:46 AM  If 7PM-7AM, please contact night-coverage [email or web address] Password [email or web address]

## 2019-08-12 DIAGNOSIS — E876 Hypokalemia: Secondary | ICD-10-CM | POA: Diagnosis not present

## 2019-08-12 DIAGNOSIS — J45909 Unspecified asthma, uncomplicated: Secondary | ICD-10-CM | POA: Diagnosis not present

## 2019-08-12 DIAGNOSIS — M79606 Pain in leg, unspecified: Secondary | ICD-10-CM | POA: Diagnosis not present

## 2019-08-12 DIAGNOSIS — K120 Recurrent oral aphthae: Secondary | ICD-10-CM | POA: Diagnosis not present

## 2019-08-12 DIAGNOSIS — E1169 Type 2 diabetes mellitus with other specified complication: Secondary | ICD-10-CM | POA: Diagnosis not present

## 2019-08-12 DIAGNOSIS — E039 Hypothyroidism, unspecified: Secondary | ICD-10-CM | POA: Diagnosis not present

## 2019-08-12 DIAGNOSIS — E782 Mixed hyperlipidemia: Secondary | ICD-10-CM | POA: Diagnosis not present

## 2019-08-12 DIAGNOSIS — I10 Essential (primary) hypertension: Secondary | ICD-10-CM | POA: Diagnosis not present

## 2019-08-12 DIAGNOSIS — M5430 Sciatica, unspecified side: Secondary | ICD-10-CM | POA: Diagnosis not present

## 2019-08-13 ENCOUNTER — Other Ambulatory Visit: Payer: Self-pay | Admitting: *Deleted

## 2019-08-13 ENCOUNTER — Encounter: Payer: Self-pay | Admitting: *Deleted

## 2019-08-13 LAB — CULTURE, BLOOD (ROUTINE X 2)
Culture: NO GROWTH
Culture: NO GROWTH
Special Requests: ADEQUATE
Special Requests: ADEQUATE

## 2019-08-13 NOTE — Patient Outreach (Signed)
[facility]) [place] Telephone Outreach PCP office completes Transition of Care follow up post-  Post-hospital discharge day # 2  08/13/2019  LEVONIA KNOBLOCK 17-Apr-1941 [id number]  Successful outreach to [name], 78 y/o female referred to [place] on 08/13/2019 by insurance provider [facility]) after recent hospitalization September 6-9, 2020 for fever/ lower extremity (foot) cellulitis/ sepsis.  Patient was discharged home to self-care without home health services in place.  Patient has history including, but not limited to, HTN and GERD.  HIPAA/ identity verified with patient during phone call today and [facility] CM services were discussed with patient who provides verbal consent for [facility] CM involvement in her care.  Today, patient states that she "is finally starting to feel better" after having a "rough couple of nights" after being discharged from hospital.  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  Patient further reports:   Medications: -- Has all medicationsand takes as prescribed;denies questions/ concerns about current medications, although she verbalizes plans to discuss her blood pressure medication changes post-hospital discharge with her PCP at time of scheduled PCP office visit; patient confirms that she has all of her medications and is taking as prescribed.  Confirms that she has obtained and is taking antibiotic post-hospital discharge and she is able to accurately verbalize accurate report of antibiotic dosing/ scheduling instructions. -- declines medication review today, stating that she "just sat down."  Discussed that we would complete  medication review at time of next outreach, and patient is agreeable to this -- self-manage medications  -- denies issues with swallowing medications  Provider appointments: -- All upcoming provider appointments were reviewed with patient today ---- Foot specialist appointment 08/20/2019 ---- PCP office visit 08/24/2019 -- discussed importance of promptly notifying PCP/ care providers for any new concerns/ iussues/ problems that arise, and pateint verbalizes agreement and confirms that she has the phone numbers to her care providers  Safety/ Mobility/ Falls: -- denies new/ recent falls: states "has never" had a fall -- assistive devices: none -- general fall risks/ prevention education discussed with patient today, as she does admit that she sometimes "feels off-balance, like (she) might fall;" encouraged patient to discuss this feeling of imbalance with her PCP/ care providers at next scheduled appointment and she agrees to do so; today, however, patient flatly denies need for cane or other assistive devices; encouraged patient to be conservative with activity and to take her time/ go slow after her recent hospitalization and she verbalizes agreement  Social/ [employer] needs: -- currently denies community resource needs, stating supportive family members that assist with care needs as indicated- patient has 2 local daughters that are very involved in she and her husband's care; daughters visit patient daily for extended periods of time, even prior to her recent hospitalization; additionally, patient reports that she remains very active in her church and has support from church friends as well -- patient continues to drive and provides her own transportation to all provider appointments, errands, etc -- SDOH completed for:  Food insecurity; depression; transportation; and Environmental education officer (AD) Planning:   --reports does currently have exisisting AD in place  for HCPOA and  Living Will, and declines desire to make changes -- basics of Advanced Directive planning were discussed with patient today   Self-health management of recent cellulitis/ sepsis: -- patient reports that her foot is "a little swollen;" and she denies drainage, redness, heat, or fever -- discussed with patient signs/ symptoms infection/ cellulitis along with action plan to take antibiotics as prescribed and attend all provider appointments- as well as to promptly address any concerns, changes, or problems, patient verbalizes understanding  Patient denies further issues, concerns, or problems today.  I provided/ confirmed that patient has my direct phone number, the main [facility] CM office phone number, and the [facility] CM 24-hour nurse advice phone number should issues arise prior to next scheduled [place] outreach.  Encouraged patient to contact me directly if needs, questions, issues, or concerns arise prior to next scheduled outreach which we scheduled today in 2 weeks; patient agreed to do so.  Plan:  Patient will take medications as prescribed and will attend all scheduled provider appointments  Patient will promptly notify care providers for any new concerns/ issues/ problems that arise  I will make patient's PCP aware of [facility] Community RN CM involvement in patient's care-- will send barriers letter  I will send patient [facility] CM successful outreach letter with [facility] CM packet and consent  [facility] Community CM outreach to continue with scheduled phone call in 2 weeks   [facility] CM Care Plan Problem One     Most Recent Value  Care Plan Problem One  High risk for hospital readmission related to recent hospital visit for sepsis seconadry to lower extremity cellulitis  Role Documenting the Problem One  Care Management Coordinator  Care Plan for Problem One  Active  [facility] Long Term Goal   Over the next 31 days, patient will not experience hospital readmission as evidenced by patient reporting  and review of EMR during [facility] RN CM outreach  [facility] Long Term Goal Start Date  08/13/19  Interventions for Problem One Long Term Goal  Discussed with patient her current clinical condition and confirmed that she has no current clinical concerns,  Reviewed hospital discharge instructions with patient and confirmed that she has no medication concerns and is taking her prescribed antibiotic as ordered post-hospital discharge,  [facility] CM program initiated  [facility] CM Short Term Goal #1   Over the next 30 days, patient will attend all scheduled provider appointments, as evidenced by patient reporting and collaboration with care providers as indicated during [facility] RN CM outreach  [facility] CM Short Term Goal #1 Start Date  08/13/19  Interventions for Short Term Goal #1  Confirmed that patient has scheduled post-hospital discharge appointments with PCP and foot specialist,  confirmed that patient plans to attend all scheduled provider appointments and has no transportation concerns     I appreciate the opportunity to participate in Eleonor's care,  Oneta Rack, RN, BSN, [employer] Coordinator [facility] Care Management  [phone]

## 2019-08-16 ENCOUNTER — Encounter: Payer: Self-pay | Admitting: Adult Health

## 2019-08-16 DIAGNOSIS — Z1231 Encounter for screening mammogram for malignant neoplasm of breast: Secondary | ICD-10-CM | POA: Diagnosis not present

## 2019-08-18 DIAGNOSIS — Z85828 Personal history of other malignant neoplasm of skin: Secondary | ICD-10-CM | POA: Diagnosis not present

## 2019-08-18 DIAGNOSIS — Z08 Encounter for follow-up examination after completed treatment for malignant neoplasm: Secondary | ICD-10-CM | POA: Diagnosis not present

## 2019-08-20 ENCOUNTER — Ambulatory Visit (HOSPITAL_COMMUNITY)
Admission: RE | Admit: 2019-08-20 | Discharge: 2019-08-20 | Disposition: A | Discharge status: Home / Self Care | Payer: Medicare HMO | Source: Ambulatory Visit | Attending: Cardiology | Admitting: Cardiology

## 2019-08-20 ENCOUNTER — Telehealth: Payer: Self-pay | Admitting: *Deleted

## 2019-08-20 ENCOUNTER — Other Ambulatory Visit: Payer: Self-pay

## 2019-08-20 ENCOUNTER — Ambulatory Visit: Payer: Medicare HMO

## 2019-08-20 ENCOUNTER — Ambulatory Visit: Payer: Medicare HMO | Admitting: Podiatry

## 2019-08-20 ENCOUNTER — Encounter: Payer: Self-pay | Admitting: Podiatry

## 2019-08-20 VITALS — BP 133/85 | HR 69 | Resp 16

## 2019-08-20 DIAGNOSIS — R609 Edema, unspecified: Secondary | ICD-10-CM

## 2019-08-20 DIAGNOSIS — J0100 Acute maxillary sinusitis, unspecified: Secondary | ICD-10-CM | POA: Insufficient documentation

## 2019-08-20 DIAGNOSIS — L84 Corns and callosities: Secondary | ICD-10-CM

## 2019-08-20 DIAGNOSIS — M79661 Pain in right lower leg: Secondary | ICD-10-CM

## 2019-08-20 DIAGNOSIS — Z872 Personal history of diseases of the skin and subcutaneous tissue: Secondary | ICD-10-CM | POA: Diagnosis not present

## 2019-08-20 DIAGNOSIS — L97511 Non-pressure chronic ulcer of other part of right foot limited to breakdown of skin: Secondary | ICD-10-CM

## 2019-08-20 NOTE — Telephone Encounter (Signed)
[place] [facility] scheduled pt of 10:00am today arrive 9:45am.

## 2019-08-20 NOTE — Telephone Encounter (Signed)
Orders faxed to [facility] and given to pt.

## 2019-08-24 ENCOUNTER — Telehealth: Payer: Self-pay | Admitting: Adult Health

## 2019-08-24 DIAGNOSIS — E1169 Type 2 diabetes mellitus with other specified complication: Secondary | ICD-10-CM | POA: Diagnosis not present

## 2019-08-24 DIAGNOSIS — M545 Low back pain: Secondary | ICD-10-CM | POA: Diagnosis not present

## 2019-08-24 DIAGNOSIS — M25561 Pain in right knee: Secondary | ICD-10-CM | POA: Diagnosis not present

## 2019-08-24 DIAGNOSIS — E785 Hyperlipidemia, unspecified: Secondary | ICD-10-CM | POA: Diagnosis not present

## 2019-08-24 DIAGNOSIS — L209 Atopic dermatitis, unspecified: Secondary | ICD-10-CM | POA: Diagnosis not present

## 2019-08-24 DIAGNOSIS — I10 Essential (primary) hypertension: Secondary | ICD-10-CM | POA: Diagnosis not present

## 2019-08-24 DIAGNOSIS — Z0001 Encounter for general adult medical examination with abnormal findings: Secondary | ICD-10-CM | POA: Diagnosis not present

## 2019-08-24 DIAGNOSIS — M79674 Pain in right toe(s): Secondary | ICD-10-CM | POA: Diagnosis not present

## 2019-08-24 DIAGNOSIS — R21 Rash and other nonspecific skin eruption: Secondary | ICD-10-CM | POA: Diagnosis not present

## 2019-08-24 DIAGNOSIS — E782 Mixed hyperlipidemia: Secondary | ICD-10-CM | POA: Diagnosis not present

## 2019-08-24 DIAGNOSIS — L03115 Cellulitis of right lower limb: Secondary | ICD-10-CM | POA: Diagnosis not present

## 2019-08-24 DIAGNOSIS — Z0000 Encounter for general adult medical examination without abnormal findings: Secondary | ICD-10-CM | POA: Diagnosis not present

## 2019-08-24 NOTE — Telephone Encounter (Signed)
Patient presented to the office stating she has an appointment at [place] today at [facility] in [place] for a bone density test.  She stated that they are needing an order or they will have to cancel her appointment.  Please advise.  Home [phone]  Cell - [phone]

## 2019-08-25 ENCOUNTER — Ambulatory Visit (INDEPENDENT_AMBULATORY_CARE_PROVIDER_SITE_OTHER): Payer: Medicare HMO | Admitting: Orthotics

## 2019-08-25 ENCOUNTER — Encounter: Payer: Self-pay | Admitting: Adult Health

## 2019-08-25 ENCOUNTER — Other Ambulatory Visit: Payer: Self-pay

## 2019-08-25 DIAGNOSIS — L97511 Non-pressure chronic ulcer of other part of right foot limited to breakdown of skin: Secondary | ICD-10-CM

## 2019-08-25 DIAGNOSIS — Z780 Asymptomatic menopausal state: Secondary | ICD-10-CM | POA: Diagnosis not present

## 2019-08-25 DIAGNOSIS — E2839 Other primary ovarian failure: Secondary | ICD-10-CM | POA: Diagnosis not present

## 2019-08-25 DIAGNOSIS — R609 Edema, unspecified: Secondary | ICD-10-CM

## 2019-08-25 DIAGNOSIS — R29890 Loss of height: Secondary | ICD-10-CM | POA: Diagnosis not present

## 2019-08-25 NOTE — Progress Notes (Addendum)
Not diabetic but will self pay 1 pair of diabetic shoes and 1 pair of inserts.   Self pay $250

## 2019-08-26 ENCOUNTER — Other Ambulatory Visit: Payer: Self-pay | Admitting: *Deleted

## 2019-08-26 ENCOUNTER — Encounter: Payer: Self-pay | Admitting: *Deleted

## 2019-08-26 NOTE — Patient Outreach (Signed)
[name]) [place] Telephone Outreach PCP completes Transition of Care follow up post-hospital discharge Post-hospital discharge day # 15  08/26/2019  [name] 05-Jan-1941 [id number]  Successful outreach to [name], 78 y/o female referred to [place] on 08/13/2019 by insurance provider [facility]) after recent hospitalization September 6-9, 2020 for fever/ lower extremity (foot) cellulitis/ sepsis.  Patient was discharged home to self-care without home health services in place.  Patient has history including, but not limited to, HTN and GERD.  HIPAA/ identity verified with patient during phone call today and patient denies clinical concerns, issues, problems, stating she just arrived home from running errands.  Denies pain outside of her normal range, stating that she has "minimal pain" in lower extremities since her recent bout with lower extremity cellulitis.  Denies new/ recent falls, and denies recent episodes of feeling "off-balance" as she previously reported at time of last [facility] RN CM outreach.  Continues to deny community resource needs stating that her local 2 daughters remain actively involved in both her and her husband's care; states that one of her daughters come to visit/ assist for extended periods each and every day.   Patient sounds to be in no distress throughout entirety of 60 minute phone call today.    Patient further reports:  -- Has and is taking all medications as prescribed.  Denies concerns/ issues around medications and confirms that she manages her own medications, as well as managing her husband's medications (husband has alzheimers disease and patient is primary caregiver for him) ---- verbalizes a good general understanding of the purpose/ dosing/ and scheduling of her medications ---- shared that PCP added short-term dose (4 days) of prednisone to assist with ongoing (R) lower extremity swelling  post-hospital discharge; states she is almost through taking this medication and only has one and a half days left.   ---- confirms that she took post-hospital discharge antibiotics as prescribed and has completed course as ordered ---- Patient was recently discharged from hospital and all medications were reviewed with patient during phone call today- no concerns with medications identified  Medications Reviewed Today    Reviewed by Knox Royalty, RN (Registered Nurse) on 08/26/19 at [place] Status: <None>  Medication Order Taking? Sig Documenting Provider Last Dose Status Informant  albuterol (VENTOLIN HFA) 108 (90 Base) MCG/ACT inhaler [id number]  Inhale 2 puffs into the lungs every 6 (six) hours as needed for wheezing or shortness of breath. [provider]  Active Self  ALPRAZolam Duanne Moron) 0.5 MG tablet [id number]  Take 0.5 mg by mouth at bedtime.  [provider]  Active Self  amLODipine (NORVASC) 2.5 MG tablet [id number]  Take 2.5 mg by mouth every evening.  [provider]  Active Self  aspirin EC 81 MG tablet [id number]  Take 81 mg by mouth daily. [provider]  Active Self  buPROPion (WELLBUTRIN SR) 100 MG 12 hr tablet [id number]  Take 100 mg by mouth daily.  [provider]  Active Self  EPINEPHrine 0.3 mg/0.3 mL IJ SOAJ injection [id number]  Inject 0.3 mLs (0.3 mg total) into the muscle once as needed for anaphylaxis. Valentina Shaggy, MD  Active Self  famotidine (PEPCID) 20 MG tablet [id number]  TAKE (1) TABLET DAILY AT BEDTIME. Tanda Rockers, MD  Active Self  gabapentin (NEURONTIN) 100 MG capsule [id number]  Take 1 capsule (100 mg total) by mouth every 8 (eight) hours as needed.  Patient taking  differently: Take 100 mg by mouth 3 (three) times daily as needed (for pain).    Carole Civil, MD  Active Self  levothyroxine (SYNTHROID) 75 MCG tablet [id number]   [provider]  Active   losartan (COZAAR) 50 MG tablet  [id number]  Take 50 mg by mouth daily.  [provider]  Active Self  Multiple Vitamin (MULTIVITAMIN) tablet [id number]  Take 1 tablet by mouth daily. [provider]  Active Self  pantoprazole (PROTONIX) 40 MG tablet [id number]  TAKE 1 TABLET BY MOUTH ONCE DAILY 30 TO 60 MINUTES BEFORE FIRST MEAL OF THE DAY. Tanda Rockers, MD  Active Self  pravastatin (PRAVACHOL) 10 MG tablet [id number]  Take 10 mg by mouth daily.  [provider]  Active Self  raloxifene (EVISTA) 60 MG tablet [id number]  TAKE 1 TABLET BY MOUTH EVERY MORNING.  Patient taking differently: Take 30 mg by mouth every other day.    Estill Dooms, NP  Active Self  SYMBICORT 80-4.5 MCG/ACT inhaler [id number]  INHALE 2 PUFFS INTO THE LUNGS TWICE DAILY. Tanda Rockers, MD  Active Self  traMADol Veatrice Bourbon) 50 MG tablet [id number]  Take 1 tablet by mouth daily as needed for moderate pain or severe pain.  [provider]  Active Self           Med Note Rosemarie Beath, MELISSA B   Thu Sep 26, 2016  3:32 PM)    triamterene-hydrochlorothiazide (MAXZIDE-25) 37.5-25 MG tablet [id number]  Take 1 tablet by mouth daily. [provider]  Active Self  valACYclovir (VALTREX) 500 MG tablet [id number]  Take 500 mg by mouth daily.  [provider]  Active Self         -- All recent provider appointments were reviewed with patient today: patient continues transporting self to all appointments/ errand needs ---- Foot specialist appointment 08/20/2019 -- attended; ordered new diabetic shoes, although she acknowledges that she "is not a diabetic;" states that due to her recent foot cellulitis this type of shoe was recommended ---- PCP office visit 08/24/2019-- attended; did not obtain flu shot, reports plans to obtain "next week" at her outpatient pharmacy where she and her husband usually receive annual flu vaccines  Self-health management of recent cellulitis/ sepsis: -- patient reports that her foot is "better  but remains a little swollen;" and reports that when she visited PCP and foot doctor shared her concerns with ongoing pain/ redness, slight swelling, which seemed to migrate upward toward her thigh with passage of time after being at home; states that she had a work up for DVT and was told that she "did not have any blood clots."  Reports pain is manageable and "completely goes away" with rest; occasionally using ice for swelling.  Taking short term prednisone as noted above per PCP order post-recent office visit.  Verbalizes plan to re-contact PCP if she is not better after prednisone dosing is complete in 2 days  -- using teach back method, reviewed signs/ symptoms infection/ cellulitis along with action plan to promptly contact care providers to address development of any concerns, changes, or problems, patient verbalizes understanding  Patient denies further issues, concerns, or problems today. Discussed with patient that I owuld be transferring her care over to dedicated [place] for [facility] patients and encouraged her to engage with new RN CM once outreach is established.  Encouraged patient to contact me in the mean time should she have needs prior to new RN CM outreach, and confirmed  that patient hasmy direct phone number, the main [facility] CM office phone number, and the [facility] CM 24-hour nurse advice phone numbers.  Plan:  Patient will take medications as prescribed and will attend all scheduled provider appointments  Patient will promptly notify care providers for any new concerns/ issues/ problems that arise  I will share today's notes/ care plan/ and medication review with patient's PCP as [facility] CM initial assessment (completed today)  I will notify/ report off to new [facility] RN CM for [place] Problem One     Most Recent Value  Care Plan Problem One  High risk for hospital readmission related to recent hospital visit for sepsis seconadry to lower extremity cellulitis  Role  Documenting the Problem One  Care Management [place] for Problem One  Active  [facility] Long Term Goal   Over the next 31 days, patient will not experience hospital readmission as evidenced by patient reporting and review of EMR during [facility] RN CM outreach  [facility] Long Term Goal Start Date  08/13/19  Interventions for Problem One Long Term Goal  Discussed current clinical condition with patient and confirmed that she has no current clinical concerns,  encouraged patient to maintain prompt contact with care providers for development of any new problems/ issues/ concerns,  confirmed that patient has all contact information for care providers  [facility] CM Short Term Goal #1   Over the next 30 days, patient will attend all scheduled provider appointments, as evidenced by patient reporting and collaboration with care providers as indicated during [facility] RN CM outreach  [facility] CM Short Term Goal #1 Start Date  08/13/19  Interventions for Short Term Goal #1  Reviewed recent provider appointments with patient along with post-office visit plan of care,  confirmed that patient continues transporting herself to provider appointments  [facility] CM Short Term Goal #2   Over the next 30 days, patient will take medications as prescribed, as evidenced by patient reporting during [facility] RN CM outreach  [facility] CM Short Term Goal #2 Start Date  08/26/19  Interventions for Short Term Goal #2  Completed post-hospital discharge/ and post- recent PCP office visit medication review with patient,  confirmed that patient has no current medication concerns and encouraged her to discuss any medication concerns with care providers     It has been a pleasure caring for Franchot Mimes, RN, BSN, [place] Coordinator [facility] Care Management  [phone]

## 2019-08-27 ENCOUNTER — Telehealth: Payer: Self-pay | Admitting: *Deleted

## 2019-08-27 ENCOUNTER — Other Ambulatory Visit: Payer: Self-pay | Admitting: *Deleted

## 2019-08-27 NOTE — Telephone Encounter (Signed)
Attempted to return patient's call.  VM not set up.

## 2019-08-27 NOTE — Patient Outreach (Signed)
[place] [facility]) Care Management [place] Telephone Outreach Care Coordination Transfer to [place] CM for [facility] [place] 08/27/2019  PAIZLEIGH HANAHAN 03/11/1941 [id number]  [place] CM Telephone Outreach Care Coordination re:  [place], 78 y/o female referred to [place] on 08/13/2019 by insurance provider [facility]) after recent hospitalization September 6-9, 2020 for fever/ lower extremity (foot) cellulitis/ sepsis. Patient was discharged home to self-care without home health services in place.Patient has history including, but not limited to, HTN and GERD.  Secure message via EMR with patient summary sent to [facility] RN CM/ [employer] to transfer patient's ongoing care management needs.  Oneta Rack, RN, BSN, [employer] [facility] Care Management  [phone]

## 2019-08-27 NOTE — Telephone Encounter (Signed)
Requesting results of her bone density scan.

## 2019-08-30 ENCOUNTER — Telehealth: Payer: Self-pay | Admitting: *Deleted

## 2019-08-30 NOTE — Telephone Encounter (Signed)
Patient requesting bone density study results.  Informed patient we have not received the results yet.  Advised to check with them to have it refaxed.  Pt stated she would.

## 2019-09-01 ENCOUNTER — Telehealth: Payer: Self-pay | Admitting: Adult Health

## 2019-09-01 NOTE — Telephone Encounter (Signed)
Pt aware that bone density normal

## 2019-09-01 NOTE — Progress Notes (Addendum)
Subjective:   Patient ID: [name], female   DOB: 78 y.o.   MRN: [id number]   HPI 78 year old female presents the office today for concerns of a callus to her right big toe.  She said that she previously had an ulcer in this area.  This did eventually heal however she was recently admitted to [facility] for cellulitis of the right lower extremity.  That ends up resolving but she is concerned about the callus.  She think this is what caused the cellulitis.  She is not diabetic.   Review of Systems  All other systems reviewed and are negative.  Past Medical History:  Diagnosis Date  . Anemia   . Anxiety   . Arthritis   . Asthma   . Constipation   . GERD (gastroesophageal reflux disease)   . High blood pressure   . Hypothyroidism   . Seasonal allergies   . Thyroid disease   . Varicose veins   . Vertigo 11/23/2013    Past Surgical History:  Procedure Laterality Date  . APPENDECTOMY    . CATARACT EXTRACTION W/PHACO Left 01/27/2017   Procedure: CATARACT EXTRACTION PHACO AND INTRAOCULAR LENS PLACEMENT (IOC);  Surgeon: Tonny Branch, MD;  Location: [facility] ORS;  Service: Ophthalmology;  Laterality: Left;  CDE: 29.90  . CATARACT EXTRACTION W/PHACO Right 02/24/2017   Procedure: CATARACT EXTRACTION PHACO AND INTRAOCULAR LENS PLACEMENT (IOC);  Surgeon: Tonny Branch, MD;  Location: [facility] OR[facility];  Service: Ophthalmology;  Laterality: Right;  CDE: 8.57  . CHOLECYSTECTOMY    . COLONOSCOPY N/A 10/01/2016   Procedure: COLONOSCOPY;  Surgeon: Daneil Dolin, MD;  Location: [facility] ENDO SUITE;  Service: Endoscopy;  Laterality: N/A;  815   . FLEXOR TENDON REPAIR Right 11/06/2017   Procedure: RIGHT WRIST FLEXOR TENDON REPAIRAND STT [place];  Surgeon: Leanora Cover, MD;  Location: [place];  Service: Orthopedics;  Laterality: Right;  . TUBAL LIGATION       Current Outpatient Medications:  .  albuterol (VENTOLIN HFA) 108 (90 Base) MCG/ACT inhaler, Inhale 2 puffs into the lungs every  6 (six) hours as needed for wheezing or shortness of breath., Disp: , Rfl:  .  ALPRAZolam (XANAX) 0.5 MG tablet, Take 0.5 mg by mouth at bedtime. , Disp: , Rfl:  .  amLODipine (NORVASC) 2.5 MG tablet, Take 2.5 mg by mouth every evening. , Disp: , Rfl:  .  aspirin EC 81 MG tablet, Take 81 mg by mouth daily., Disp: , Rfl:  .  buPROPion (WELLBUTRIN SR) 100 MG 12 hr tablet, Take 100 mg by mouth daily. , Disp: , Rfl:  .  EPINEPHrine 0.3 mg/0.3 mL IJ SOAJ injection, Inject 0.3 mLs (0.3 mg total) into the muscle once as needed for anaphylaxis., Disp: 2 Device, Rfl: 1 .  famotidine (PEPCID) 20 MG tablet, TAKE (1) TABLET DAILY AT BEDTIME., Disp: 30 tablet, Rfl: 2 .  fluticasone (FLONASE) 50 MCG/ACT nasal spray, Place 2 sprays into both nostrils daily., Disp: , Rfl:  .  gabapentin (NEURONTIN) 100 MG capsule, Take 1 capsule (100 mg total) by mouth every 8 (eight) hours as needed. (Patient taking differently: Take 100 mg by mouth 3 (three) times daily as needed (for pain). ), Disp: 60 capsule, Rfl: 3 .  levothyroxine (SYNTHROID) 75 MCG tablet, , Disp: , Rfl:  .  losartan (COZAAR) 50 MG tablet, Take 50 mg by mouth daily. , Disp: , Rfl:  .  Multiple Vitamin (MULTIVITAMIN) tablet, Take 1 tablet by mouth daily.,  Disp: , Rfl:  .  pantoprazole (PROTONIX) 40 MG tablet, TAKE 1 TABLET BY MOUTH ONCE DAILY 30 TO 60 MINUTES BEFORE FIRST MEAL OF THE DAY., Disp: 90 tablet, Rfl: 0 .  pravastatin (PRAVACHOL) 10 MG tablet, Take 10 mg by mouth daily. , Disp: , Rfl:  .  raloxifene (EVISTA) 60 MG tablet, TAKE 1 TABLET BY MOUTH EVERY MORNING. (Patient taking differently: Take 30 mg by mouth every other day. ), Disp: 30 tablet, Rfl: 6 .  SYMBICORT 80-4.5 MCG/ACT inhaler, INHALE 2 PUFFS INTO THE LUNGS TWICE DAILY., Disp: 10.2 g, Rfl: 0 .  traMADol (ULTRAM) 50 MG tablet, Take 1 tablet by mouth daily as needed for moderate pain or severe pain. , Disp: , Rfl:  .  triamterene-hydrochlorothiazide (MAXZIDE-25) 37.5-25 MG tablet, Take 1  tablet by mouth daily., Disp: , Rfl:  .  valACYclovir (VALTREX) 500 MG tablet, Take 500 mg by mouth daily. , Disp: , Rfl:   Allergies  Allergen Reactions  . Amoxicillin-Pot Clavulanate Nausea And Vomiting, Other (See Comments), Itching and Nausea Only    Has patient had a PCN reaction causing immediate rash, facial/tongue/throat swelling, SOB or lightheadedness with hypotension: No Has patient had a PCN reaction causing severe rash involving mucus membranes or skin necrosis: No Has patient had a PCN reaction that required hospitalization: No Has patient had a PCN reaction occurring within the last 10 years: No If all of the above answers are "NO", then may proceed with Cephalosporin use. Has patient had a PCN reaction causing immediate rash, facial/tongue/throat swelling, SOB or lightheadedness with hypotension: No Has patient had a PCN reaction causing severe rash involving mucus membranes or skin necrosis: No Has patient had a PCN reaction that required hospitalization: No Has patient had a PCN reaction occurring within the last 10 years: No If all of the above answers are "NO", then may proceed with Cephalosporin use.   . Ace Inhibitors Cough  . Entex La Itching  . Levofloxacin Hives and Itching         Objective:  Physical Exam  General: AAO x3, NAD  Dermatological: Hyperkeratotic lesion present on the right hallux.  However upon debridement there is no ongoing ulceration drainage or any signs of infection.  No other open lesions identified.  There is no evidence of cellulitis today.  Vascular: Dorsalis Pedis artery and Posterior Tibial artery pedal pulses are 2/4 bilateral with immedate capillary fill time.  Mild swelling to the right lower extremity compared to left.  There is no pain with calf compression, swelling, warmth, erythema.   Neruologic: Grossly intact via light touch bilateral.   Musculoskeletal: No gross boney pedal deformities bilateral. No pain, crepitus, or  limitation noted with foot and ankle range of motion bilateral. Muscular strength 5/5 in all groups tested bilateral.  Gait: Unassisted, Nonantalgic.       Assessment:   Pre-ulcerative callus right hallux with history of cellulitis     Plan:  -Treatment options discussed including all alternatives, risks, and complications -Etiology of symptoms were discussed -Reviewed the x-ray from the hospital.At this time there is no evidence of acute infection or open sore.  It does seem that the callus could have started the infection.  I do recommend her to keep moisturizer on the area daily as well as offloading.  I do think insert would be helpful for her to help prevent reoccurrence.  She will follow-up with Liliane Channel for this.  -Order venous duplex to rule out DVT given she was having  some swelling and pain to her leg. -Monitor for any signs or symptoms of recurrent infection call the office and you should any occur or go to the emergency department.  Trula Slade DPM

## 2019-09-02 DIAGNOSIS — R69 Illness, unspecified: Secondary | ICD-10-CM | POA: Diagnosis not present

## 2019-09-13 ENCOUNTER — Other Ambulatory Visit: Payer: Self-pay

## 2019-09-13 NOTE — Patient Outreach (Signed)
[place] [facility]) Care Management  [place]  09/13/2019   [name] 31-Dec-1940 [id number]  Subjective: Telephone call to patient for follow up.  Patient report she is doing ok.  She states that she has an ulcer to bottom of Right great toe that is suspected source of her recent cellulitis.  Patient states that she is seeing the podiatrist regularly and she is cleaning and dressing the area regularly.  Patient also reports she has a cushion sort of thing that put on her toe to help with pain.  She states she may get a pain in it every now and again.  She states she was given prednisone by the Nurse practitioner at her PCP as it was thought that she had some inflammation to her knee area.  She states that she still has some pain despite this but not enough to go back to the doctor.  Advised her that if pain increases or is more bothersome to contact her doctor.  She verbalized understanding.  Patient also states she was fitted for shoes and an insert.  She states her insurance did not pay for it but will inquire when she goes back to the podiatrist. Patient also mentions chronic sinus issues and that she used Flonase regularly. Advised patient that if she hs further problems with her sinuses to discuss further with her PCP.  She verbalized understanding. Discussed cellulitis with patient and signs of worsening.  She verbalized understanding and denies any problems.    Objective:   Encounter Medications:  Outpatient Encounter Medications as of 09/13/2019  Medication Sig Note  . albuterol (VENTOLIN HFA) 108 (90 Base) MCG/ACT inhaler Inhale 2 puffs into the lungs every 6 (six) hours as needed for wheezing or shortness of breath. 08/26/2019: Reports has not needed recently  . ALPRAZolam (XANAX) 0.5 MG tablet Take 0.5 mg by mouth at bedtime.    Marland Kitchen amLODipine (NORVASC) 2.5 MG tablet Take 2.5 mg by mouth every evening.    Marland Kitchen aspirin EC 81 MG tablet Take 81 mg by mouth daily.   Marland Kitchen  buPROPion (WELLBUTRIN SR) 100 MG 12 hr tablet Take 100 mg by mouth daily.    Marland Kitchen EPINEPHrine 0.3 mg/0.3 mL IJ SOAJ injection Inject 0.3 mLs (0.3 mg total) into the muscle once as needed for anaphylaxis. 08/26/2019: Has not needed recently  . famotidine (PEPCID) 20 MG tablet TAKE (1) TABLET DAILY AT BEDTIME.   . fluticasone (FLONASE) 50 MCG/ACT nasal spray Place 2 sprays into both nostrils daily.   Marland Kitchen gabapentin (NEURONTIN) 100 MG capsule Take 1 capsule (100 mg total) by mouth every 8 (eight) hours as needed. (Patient taking differently: Take 100 mg by mouth 3 (three) times daily as needed (for pain). ) 08/26/2019: 08/26/2019: reports taking as needed  . levothyroxine (SYNTHROID) 75 MCG tablet    . losartan (COZAAR) 50 MG tablet Take 50 mg by mouth daily.  08/26/2019: Reports taking 25 mg po QD  . Multiple Vitamin (MULTIVITAMIN) tablet Take 1 tablet by mouth daily.   . pantoprazole (PROTONIX) 40 MG tablet TAKE 1 TABLET BY MOUTH ONCE DAILY 30 TO 60 MINUTES BEFORE FIRST MEAL OF THE DAY.   . pravastatin (PRAVACHOL) 10 MG tablet Take 10 mg by mouth daily.    . raloxifene (EVISTA) 60 MG tablet TAKE 1 TABLET BY MOUTH EVERY MORNING. (Patient taking differently: Take 30 mg by mouth every other day. ) 08/26/2019: 08/26/2019: Patient reports taking (one) 60 mg tablet every other day  .  SYMBICORT 80-4.5 MCG/ACT inhaler INHALE 2 PUFFS INTO THE LUNGS TWICE DAILY. 08/26/2019: Reports using QD in am  . traMADol (ULTRAM) 50 MG tablet Take 1 tablet by mouth daily as needed for moderate pain or severe pain.  08/26/2019: Uses rarely per patient report   . triamterene-hydrochlorothiazide (MAXZIDE-25) 37.5-25 MG tablet Take 1 tablet by mouth daily. 08/26/2019: Reports was told by PCP on 08/24/2019 to continue taking this medication "as needed for fluid retention"   . valACYclovir (VALTREX) 500 MG tablet Take 500 mg by mouth daily.  08/26/2019: Reports taking as needed   No facility-administered encounter medications on file as of  09/13/2019.     Functional Status:  In your present state of health, do you have any difficulty performing the following activities: 08/26/2019 08/13/2019  Hearing? Y -  Comment Wears bilateral hearing aides -  Vision? N -  Difficulty concentrating or making decisions? - N  Walking or climbing stairs? - N  Dressing or bathing? - N  Doing errands, shopping? - N  Conservation officer, nature and eating ? - N  Using the Toilet? - N  In the past six months, have you accidently leaked urine? - Y  Comment - wears liner pads for occasional urine leaks  Do you have problems with loss of bowel control? - N  Managing your Medications? - N  Managing your Finances? - N  Housekeeping or managing your Housekeeping? - N  Some recent data might be hidden    Fall/Depression Screening: Fall Risk  08/26/2019 08/13/2019 07/30/2016  Falls in the past year? (No Data) 0 No  Comment Falls screening previously completed- patient denies new/ recent falls since last [facility] RN CM outreach - [place]i Telephone Survey: data to providers prior to load  Number falls in past yr: - 0 -  Comment - No falls over last year -  Injury with Fall? - 0 -  Comment - N/A; denies falls -  Risk for fall due to : - Impaired balance/gait;Medication side effect -  Follow up - Falls prevention discussed -   PHQ 2/9 Scores 08/13/2019  PHQ - 2 Score 0    Assessment:  Patient cellulitis is subsiding and patient dressing right great toe ulcer and seeing podiatry.    Plan:  RN CM will contact patient again in the month of October and patient agreeable.    Jone Baseman, RN, MSN [place] Management Care Management Coordinator Direct Line [phone] Cell [phone] Toll Free: [phone]  Fax: [phone]

## 2019-09-14 DIAGNOSIS — G501 Atypical facial pain: Secondary | ICD-10-CM | POA: Diagnosis not present

## 2019-09-14 DIAGNOSIS — R0981 Nasal congestion: Secondary | ICD-10-CM | POA: Diagnosis not present

## 2019-09-20 ENCOUNTER — Ambulatory Visit: Payer: Medicare HMO | Admitting: Orthotics

## 2019-09-20 ENCOUNTER — Other Ambulatory Visit: Payer: Self-pay

## 2019-09-20 ENCOUNTER — Ambulatory Visit (INDEPENDENT_AMBULATORY_CARE_PROVIDER_SITE_OTHER): Payer: Medicare HMO | Admitting: Podiatry

## 2019-09-20 DIAGNOSIS — L84 Corns and callosities: Secondary | ICD-10-CM

## 2019-09-20 DIAGNOSIS — Z872 Personal history of diseases of the skin and subcutaneous tissue: Secondary | ICD-10-CM | POA: Diagnosis not present

## 2019-09-20 DIAGNOSIS — M79661 Pain in right lower leg: Secondary | ICD-10-CM

## 2019-09-20 DIAGNOSIS — R609 Edema, unspecified: Secondary | ICD-10-CM

## 2019-09-20 DIAGNOSIS — L97511 Non-pressure chronic ulcer of other part of right foot limited to breakdown of skin: Secondary | ICD-10-CM

## 2019-09-20 NOTE — Progress Notes (Signed)
Patient picked  Up shoes.Marland KitchenMarland Kitchen

## 2019-09-21 DIAGNOSIS — M25561 Pain in right knee: Secondary | ICD-10-CM | POA: Diagnosis not present

## 2019-09-21 DIAGNOSIS — R0981 Nasal congestion: Secondary | ICD-10-CM | POA: Diagnosis not present

## 2019-09-21 DIAGNOSIS — G501 Atypical facial pain: Secondary | ICD-10-CM | POA: Diagnosis not present

## 2019-09-21 NOTE — Progress Notes (Signed)
Subjective: 78 year old female presents the office today for follow-up evaluation of preulcerative callus on the right foot.  She said overall she has been doing well but the calluses still present.  Denies any open sores and denies any drainage or redness or any swelling.  She presents today for Inserts. Denies any systemic complaints such as fevers, chills, nausea, vomiting. No acute changes since last appointment, and no other complaints at this time.   Objective: AAO x3, NAD DP/PT pulses palpable bilaterally, CRT less than 3 seconds Hyperkeratotic lesion medial first MPJ on the right foot as well as plantar medial right hallux without any underlying ulceration identified there is no signs of infection.  No edema, erythema.  No pain with calf compression, warmth, erythema.  Chronic lower extremity edema but she is wearing compression socks.  Assessment: Preulcerative calluses right foot  Plan: -All treatment options discussed with the patient including all alternatives, risks, complications.  -Lightly debrided the calluses today without any complications or bleeding.  No signs of infection ongoing ulceration.  Continue offloading for now.  She did see Liliane Channel to pick up shoes, inserts.  Although not diabetic I do think she will benefit from offloading to prevent future reulceration, infection and hopefully prevent any future hospitalizations. -Patient encouraged to call the office with any questions, concerns, change in symptoms.   Trula Slade DPM

## 2019-09-25 ENCOUNTER — Other Ambulatory Visit: Payer: Self-pay | Admitting: Adult Health

## 2019-09-27 ENCOUNTER — Other Ambulatory Visit: Payer: Self-pay

## 2019-09-27 NOTE — Patient Outreach (Signed)
[place] [facility]) Care Management  [place]  09/27/2019   [name] [birth year] [id number]  Subjective: Telephone call to patient for follow up call.  She reports going to the dentist today for 2 fill ins.  She states that she saw podiatrist last week and she got her insert for her shoes.  She states he shaved it down and it looks almost normal.  Patient reports that she has no signs of cellulitis.  Patient having some pain in her neck this morning.  She reports using heat, ice and tylenol intermittently.  Discussed neck exercises and if pain continues to call physician.  She verbalized understanding.     Objective:   Encounter Medications:  Outpatient Encounter Medications as of 09/27/2019  Medication Sig Note  . albuterol (VENTOLIN HFA) 108 (90 Base) MCG/ACT inhaler Inhale 2 puffs into the lungs every 6 (six) hours as needed for wheezing or shortness of breath. 08/26/2019: Reports has not needed recently  . ALPRAZolam (XANAX) 0.5 MG tablet Take 0.5 mg by mouth at bedtime.    Marland Kitchen amLODipine (NORVASC) 2.5 MG tablet Take 2.5 mg by mouth every evening.    Marland Kitchen aspirin EC 81 MG tablet Take 81 mg by mouth daily.   Marland Kitchen buPROPion (WELLBUTRIN SR) 100 MG 12 hr tablet Take 100 mg by mouth daily.    Marland Kitchen EPINEPHrine 0.3 mg/0.3 mL IJ SOAJ injection Inject 0.3 mLs (0.3 mg total) into the muscle once as needed for anaphylaxis. 08/26/2019: Has not needed recently  . famotidine (PEPCID) 20 MG tablet TAKE (1) TABLET DAILY AT BEDTIME.   . fluticasone (FLONASE) 50 MCG/ACT nasal spray Place 2 sprays into both nostrils daily.   Marland Kitchen gabapentin (NEURONTIN) 100 MG capsule Take 1 capsule (100 mg total) by mouth every 8 (eight) hours as needed. (Patient taking differently: Take 100 mg by mouth 3 (three) times daily as needed (for pain). ) 08/26/2019: 08/26/2019: reports taking as needed  . levothyroxine (SYNTHROID) 75 MCG tablet    . losartan (COZAAR) 50 MG tablet Take 50 mg by mouth daily.  08/26/2019:  Reports taking 25 mg po QD  . Multiple Vitamin (MULTIVITAMIN) tablet Take 1 tablet by mouth daily.   . pantoprazole (PROTONIX) 40 MG tablet TAKE 1 TABLET BY MOUTH ONCE DAILY 30 TO 60 MINUTES BEFORE FIRST MEAL OF THE DAY.   . pravastatin (PRAVACHOL) 10 MG tablet Take 10 mg by mouth daily.    . raloxifene (EVISTA) 60 MG tablet TAKE 1 TABLET BY MOUTH EVERY MORNING.   . SYMBICORT 80-4.5 MCG/ACT inhaler INHALE 2 PUFFS INTO THE LUNGS TWICE DAILY. 08/26/2019: Reports using QD in am  . traMADol (ULTRAM) 50 MG tablet Take 1 tablet by mouth daily as needed for moderate pain or severe pain.  08/26/2019: Uses rarely per patient report   . triamterene-hydrochlorothiazide (MAXZIDE-25) 37.5-25 MG tablet Take 1 tablet by mouth daily. 08/26/2019: Reports was told by PCP on 08/24/2019 to continue taking this medication "as needed for fluid retention"   . valACYclovir (VALTREX) 500 MG tablet Take 500 mg by mouth daily.  08/26/2019: Reports taking as needed   No facility-administered encounter medications on file as of 09/27/2019.     Functional Status:  In your present state of health, do you have any difficulty performing the following activities: 08/26/2019 08/13/2019  Hearing? Y -  Comment Wears bilateral hearing aides -  Vision? N -  Difficulty concentrating or making decisions? - N  Walking or climbing stairs? - N  Dressing  or bathing? - N  Doing errands, shopping? - N  Conservation officer, nature and eating ? - N  Using the Toilet? - N  In the past six months, have you accidently leaked urine? - Y  Comment - wears liner pads for occasional urine leaks  Do you have problems with loss of bowel control? - N  Managing your Medications? - N  Managing your Finances? - N  Housekeeping or managing your Housekeeping? - N  Some recent data might be hidden    Fall/Depression Screening: Fall Risk  09/13/2019 08/26/2019 08/13/2019  Falls in the past year? 0 (No Data) 0  Comment - Falls screening previously completed- patient  denies new/ recent falls since last [facility] RN CM outreach -  Number falls in past yr: - - 0  Comment - - No falls over last year  Injury with Fall? - - 0  Comment - - N/A; denies falls  Risk for fall due to : - - Impaired balance/gait;Medication side effect  Follow up - - Falls prevention discussed   PHQ 2/9 Scores 08/13/2019  PHQ - 2 Score 0    Assessment: Patient continues to manage disease processes.  Patient sees physician regularly.    Plan:  [facility] CM Care Plan Problem One     Most Recent Value  Care Plan Problem One  Patient at risk for cellulitis due to callus.    Role Documenting the Problem One  Care Management Coordinator  Care Plan for Problem One  Active  [facility] Long Term Goal   Over the next 30 days, will continue to monitor for signs of cellulitis.   [facility] Long Term Goal Start Date  09/27/19  Interventions for Problem One Long Term Goal  RN CM discussed with patient signs of cellulitis  [facility] CM Short Term Goal #2   Over the next 30 days, patient will take medications as prescribed, as evidenced by patient reporting during [facility] RN CM outreach  [facility] CM Short Term Goal #2 Start Date  08/26/19  Interventions for Short Term Goal #2  RN CM reiterated importance of medication compliance.       RN CM will outreach patient in the month of November and patient agrees.    Jone Baseman, RN, MSN [place] Management Care Management Coordinator Direct Line [phone] Cell [phone] Toll Free: [phone]  Fax: [phone]

## 2019-10-04 DIAGNOSIS — M25561 Pain in right knee: Secondary | ICD-10-CM | POA: Diagnosis not present

## 2019-10-04 DIAGNOSIS — M542 Cervicalgia: Secondary | ICD-10-CM | POA: Diagnosis not present

## 2019-10-04 DIAGNOSIS — M546 Pain in thoracic spine: Secondary | ICD-10-CM | POA: Diagnosis not present

## 2019-10-04 DIAGNOSIS — G501 Atypical facial pain: Secondary | ICD-10-CM | POA: Diagnosis not present

## 2019-10-04 DIAGNOSIS — I10 Essential (primary) hypertension: Secondary | ICD-10-CM | POA: Diagnosis not present

## 2019-10-04 DIAGNOSIS — M9901 Segmental and somatic dysfunction of cervical region: Secondary | ICD-10-CM | POA: Diagnosis not present

## 2019-10-04 DIAGNOSIS — M9902 Segmental and somatic dysfunction of thoracic region: Secondary | ICD-10-CM | POA: Diagnosis not present

## 2019-10-06 DIAGNOSIS — M546 Pain in thoracic spine: Secondary | ICD-10-CM | POA: Diagnosis not present

## 2019-10-06 DIAGNOSIS — M542 Cervicalgia: Secondary | ICD-10-CM | POA: Diagnosis not present

## 2019-10-06 DIAGNOSIS — M9901 Segmental and somatic dysfunction of cervical region: Secondary | ICD-10-CM | POA: Diagnosis not present

## 2019-10-06 DIAGNOSIS — M9902 Segmental and somatic dysfunction of thoracic region: Secondary | ICD-10-CM | POA: Diagnosis not present

## 2019-10-08 DIAGNOSIS — M542 Cervicalgia: Secondary | ICD-10-CM | POA: Diagnosis not present

## 2019-10-08 DIAGNOSIS — M79651 Pain in right thigh: Secondary | ICD-10-CM | POA: Diagnosis not present

## 2019-10-08 DIAGNOSIS — M25561 Pain in right knee: Secondary | ICD-10-CM | POA: Insufficient documentation

## 2019-10-08 DIAGNOSIS — M546 Pain in thoracic spine: Secondary | ICD-10-CM | POA: Diagnosis not present

## 2019-10-08 DIAGNOSIS — M9901 Segmental and somatic dysfunction of cervical region: Secondary | ICD-10-CM | POA: Diagnosis not present

## 2019-10-08 DIAGNOSIS — M9902 Segmental and somatic dysfunction of thoracic region: Secondary | ICD-10-CM | POA: Diagnosis not present

## 2019-10-11 ENCOUNTER — Other Ambulatory Visit: Payer: Self-pay

## 2019-10-11 DIAGNOSIS — M9902 Segmental and somatic dysfunction of thoracic region: Secondary | ICD-10-CM | POA: Diagnosis not present

## 2019-10-11 DIAGNOSIS — M9901 Segmental and somatic dysfunction of cervical region: Secondary | ICD-10-CM | POA: Diagnosis not present

## 2019-10-11 DIAGNOSIS — M542 Cervicalgia: Secondary | ICD-10-CM | POA: Diagnosis not present

## 2019-10-11 DIAGNOSIS — M546 Pain in thoracic spine: Secondary | ICD-10-CM | POA: Diagnosis not present

## 2019-10-11 NOTE — Patient Outreach (Signed)
[place] [facility]) Care Management  10/11/2019  LAURENE BARTLES 01-12-1941 [id number]   Telephone call to patient for follow up. Patient reports going to a chiropractor for her neck and it feels much better.  She continues to monitor the area under her right big toe and place bandage.  Patient is also wearing her insert and her shoes.  Discussed cellulitis signs and symptoms.  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  We discussed caregiver role and strain and CM encouraged patient to take some time for herself and allow her daughters to help who come over to help.  She verbalized understanding and declines any concerns.    Plan: RN CM will contact patient in the month of December and patient agreeable.   Jone Baseman, RN, MSN [place] Management Care Management Coordinator Direct Line [phone] Cell [phone] Toll Free: [phone]  Fax: [phone]

## 2019-10-14 DIAGNOSIS — M542 Cervicalgia: Secondary | ICD-10-CM | POA: Diagnosis not present

## 2019-10-14 DIAGNOSIS — M9902 Segmental and somatic dysfunction of thoracic region: Secondary | ICD-10-CM | POA: Diagnosis not present

## 2019-10-14 DIAGNOSIS — M546 Pain in thoracic spine: Secondary | ICD-10-CM | POA: Diagnosis not present

## 2019-10-14 DIAGNOSIS — M9901 Segmental and somatic dysfunction of cervical region: Secondary | ICD-10-CM | POA: Diagnosis not present

## 2019-10-18 ENCOUNTER — Ambulatory Visit: Payer: Medicare HMO | Admitting: Podiatry

## 2019-10-18 ENCOUNTER — Other Ambulatory Visit: Payer: Self-pay

## 2019-10-18 DIAGNOSIS — M792 Neuralgia and neuritis, unspecified: Secondary | ICD-10-CM

## 2019-10-18 DIAGNOSIS — Z872 Personal history of diseases of the skin and subcutaneous tissue: Secondary | ICD-10-CM

## 2019-10-18 DIAGNOSIS — L84 Corns and callosities: Secondary | ICD-10-CM | POA: Diagnosis not present

## 2019-10-24 IMAGING — MR MR WRIST*R* W/O CM
5 series · 40 of 40 positions shown · non-contrast
Comparison: None.

CLINICAL DATA: Sharp pain along the palmar aspect of the wrist.
Symptoms for 7-8 weeks.

EXAM:
MR OF THE RIGHT WRIST WITHOUT CONTRAST
TECHNIQUE: Multiplanar, multisequence MR imaging of the right wrist was
performed. No intravenous contrast was administered.

[Series 100: T2 fat-sat · axial · right · 3.0mm · 0.28mm/px · z∈[-78,+1]mm · 12 of 25 slices shown (1 of 2)]
[im 1/25]
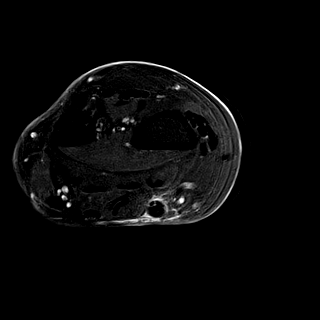
[im 3/25]
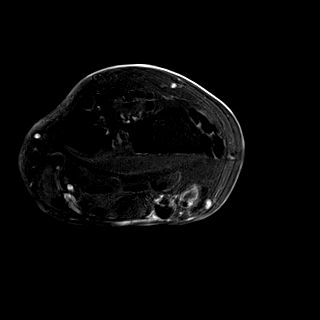
[im 5/25]
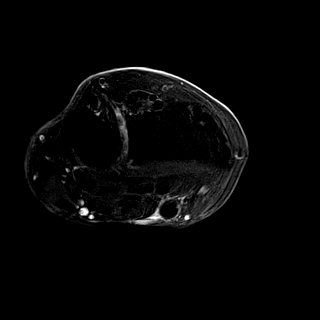
[im 7/25]
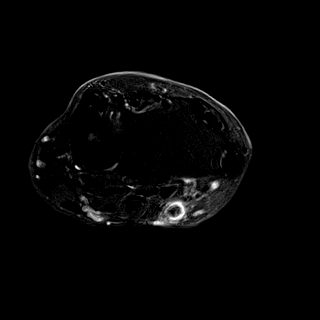
[im 9/25]
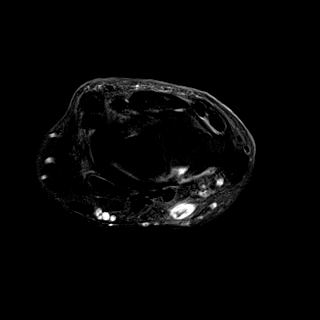
[im 11/25]
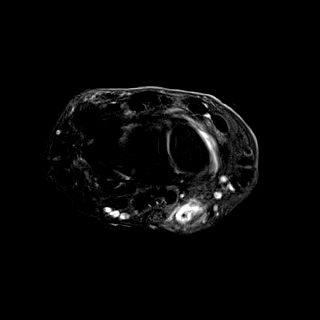
[im 14/25]
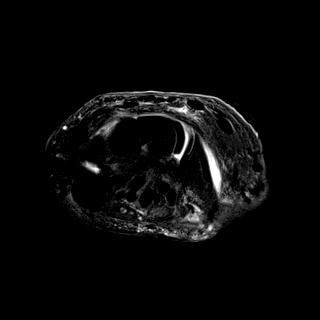
[im 16/25]
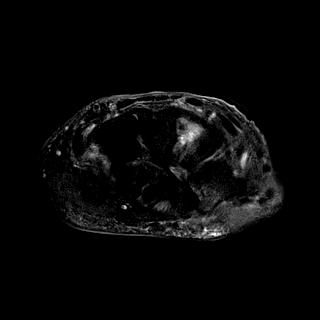
[im 18/25]
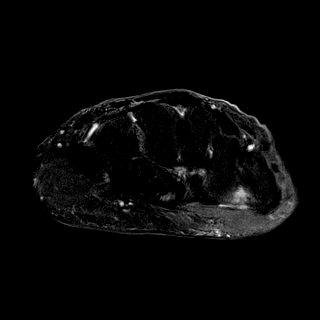
[im 20/25]
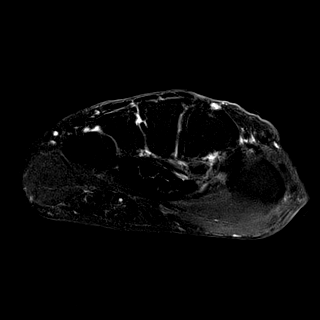
[im 22/25]
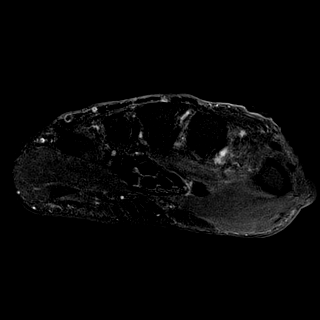
[im 25/25]
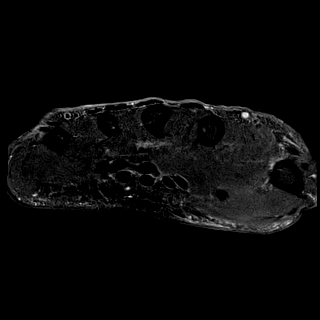

[Series 101: T1 · coronal · right · 3.0mm · 0.23mm/px · 6 of 11 slices shown]
[im 1/11]
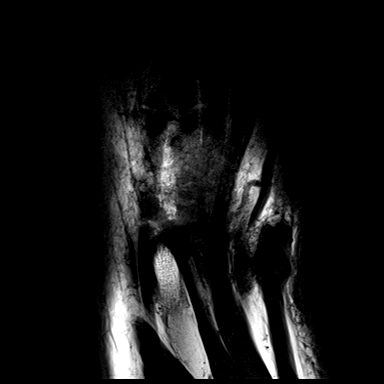
[im 3/11]
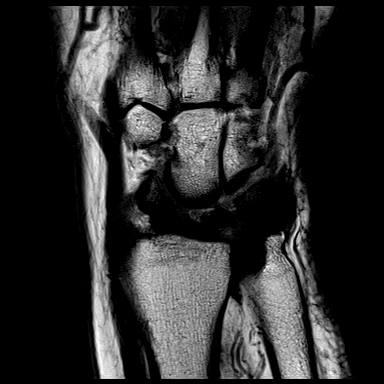
[im 5/11]
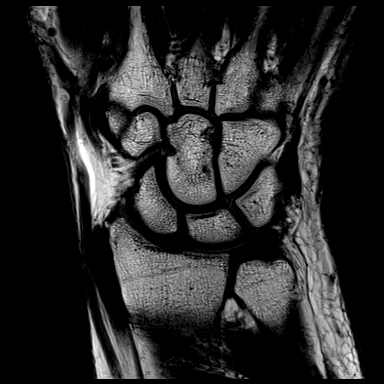
[im 7/11]
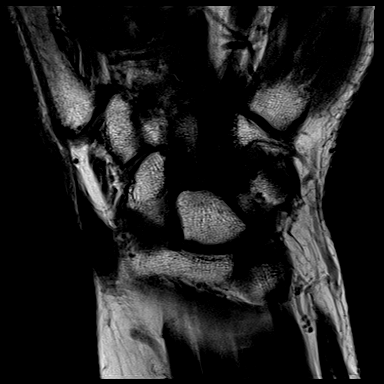
[im 9/11]
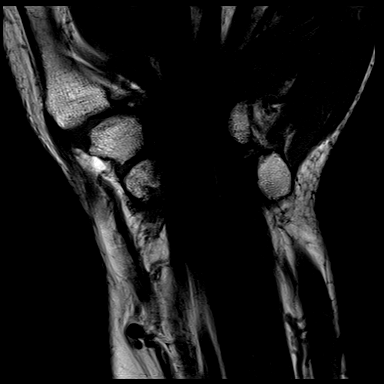
[im 11/11]
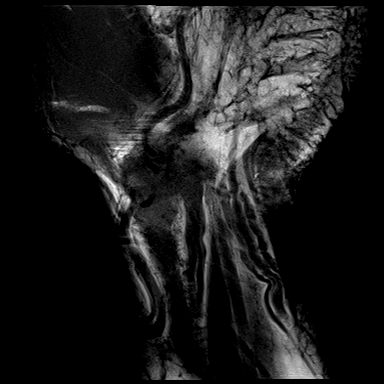

[Series 102: T2 fat-sat · coronal · right · 3.0mm · 0.28mm/px · 6 of 11 slices shown (2 of 2)]
[im 1/11]
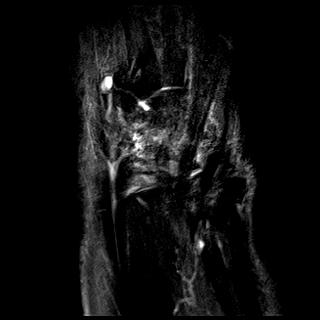
[im 3/11]
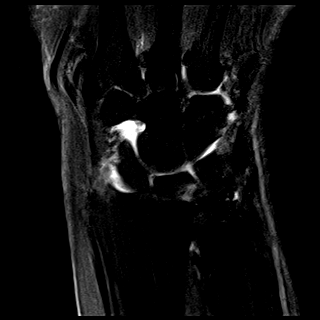
[im 5/11]
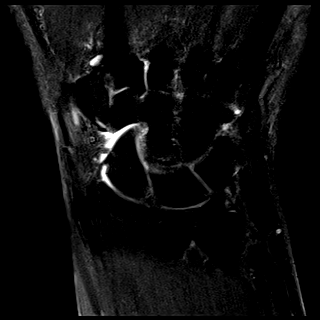
[im 7/11]
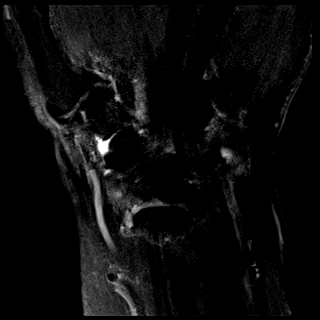
[im 9/11]
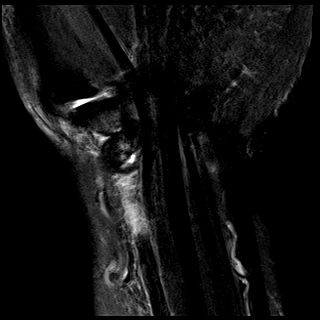
[im 11/11]
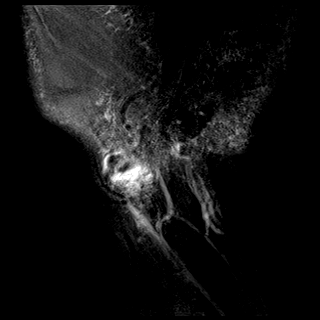

[Series 103: PD fat-sat · coronal · right · 3.0mm · 0.28mm/px · 6 of 11 slices shown (1 of 2)]
[im 1/11]
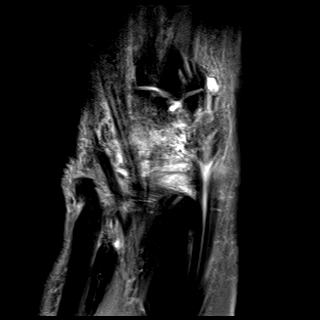
[im 3/11]
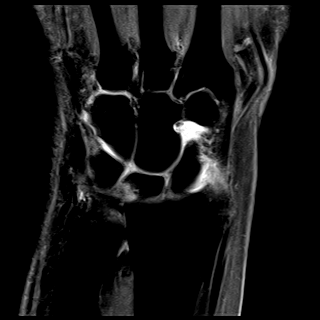
[im 5/11]
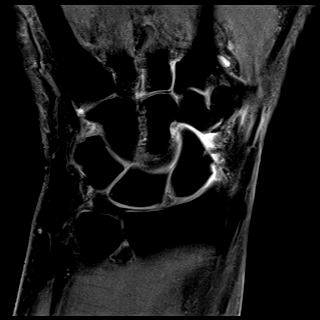
[im 7/11]
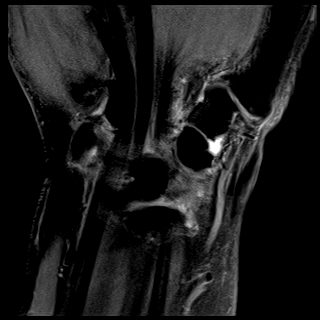
[im 9/11]
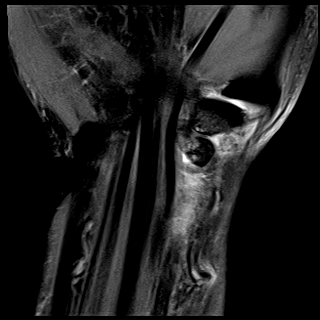
[im 11/11]
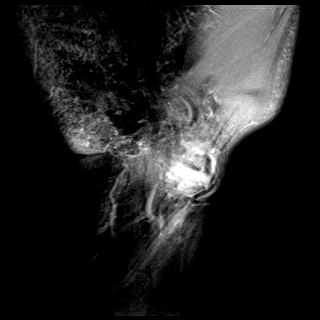

[Series 104: PD fat-sat · sagittal · right · 3.0mm · 0.28mm/px · 10 of 19 slices shown (2 of 2)]
[im 1/19]
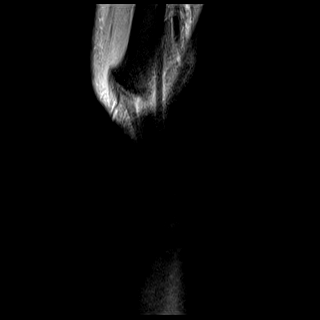
[im 3/19]
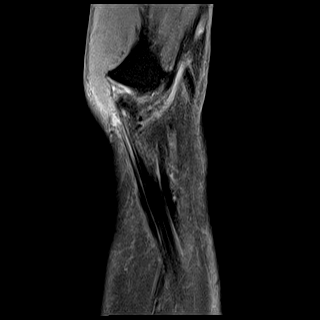
[im 5/19]
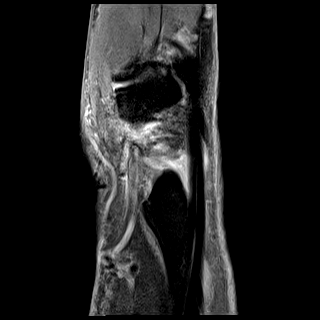
[im 7/19]
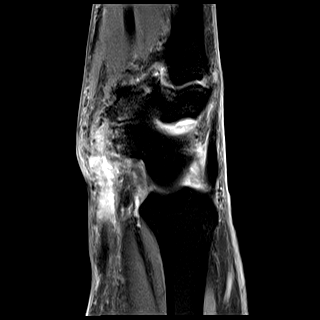
[im 9/19]
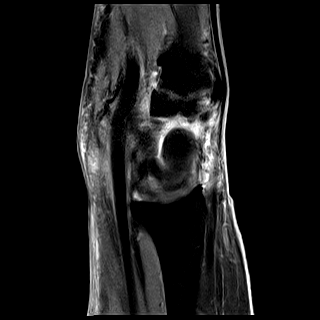
[im 11/19]
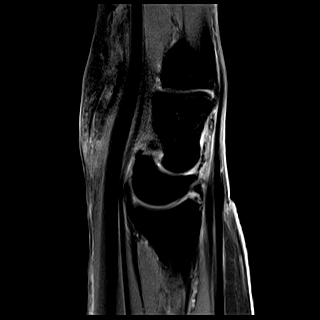
[im 13/19]
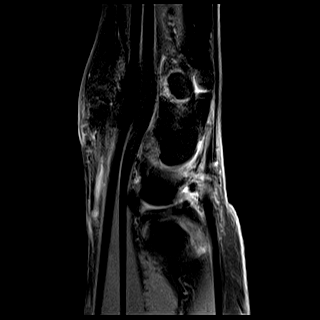
[im 15/19]
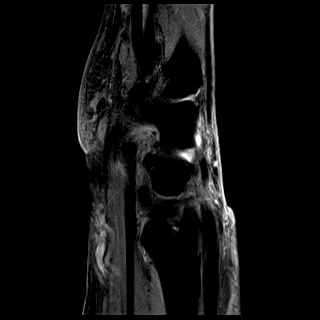
[im 17/19]
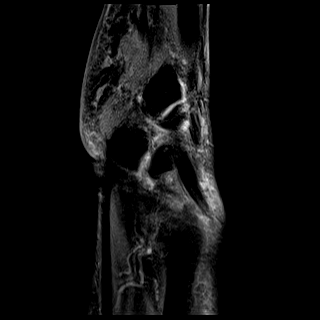
[im 19/19]
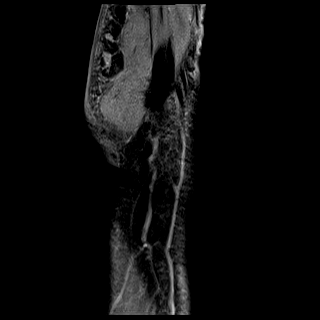

[40 of 40 positions shown; findings below may reference images not displayed]

FINDINGS: Ligaments: Increased signal within the scapholunate ligament without
complete disruption likely reflecting degeneration versus partial
tear. Intact lunotriquetral ligament.

Triangular fibrocartilage: Intact TFCC.

Tendons: High-grade partial, near complete, tear of the flexor carpi
radialis tendon at the level of the radiocarpal joint. Very thin
remanent of intact fiber remains at the level of the radiocarpal
joint spanning a distance of approximately 13 mm. Moderate amount of
fluid consistent with tenosynovitis. Remainder of the flexor
compartment tendons are intact. Intact extensor compartment tendons.

Carpal tunnel/median nerve: Normal carpal tunnel. Normal median
nerve.

Guyon's canal: Normal.

Joint/cartilage: Mild osteoarthritis of the first CMC joint.
Partial-thickness cartilage loss of the scaphotrapeziotrapezoid
joint. Otherwise, no chondral defects. No joint effusion. No
synovitis.

Bones/carpal alignment: No marrow signal abnormality. No fracture
dislocation. No bone destruction or aggressive osseous lesion. Mild
dorsal tilting of the lunate likely positional.

Other: No fluid collection or hematoma.
IMPRESSION: 1. High-grade partial, near complete, tear of the flexor carpi
radialis tendon with tenosynovitis. Very thin remanent of intact
fiber remains at the level of the radiocarpal joint spanning a
distance of approximately 13 mm.

## 2019-10-24 NOTE — Progress Notes (Addendum)
Subjective: 78 year old female presents the office today for follow-up evaluation of preulcerative callus on the right foot.  She states that she is doing well with the calluses still present but she denies any drainage or pus she denies any swelling or redness to her foot.  She states that she gets a lot of burning pain that has been ongoing since she had cellulitis.  This is more of the leg.  She is on gabapentin 100 mg. Denies any systemic complaints such as fevers, chills, nausea, vomiting. No acute changes since last appointment, and no other complaints at this time.   Objective: AAO x3, NAD DP/PT pulses palpable bilaterally, CRT less than 3 seconds Hyperkeratotic lesion medial first MPJ on the right foot as well as plantar medial right hallux without any underlying ulceration identified there is no signs of infection.  Hyperkeratotic tissue is minimal today.  No edema, erythema. Chronic lower extremity edema but she is wearing compression socks.  There is no pain with calf compression, erythema pumps.  Assessment: Preulcerative calluses right foot; neuritis  Plan: -All treatment options discussed with the patient including all alternatives, risks, complications.  -Lightly debrided the calluses today without any complications or bleeding.  No signs of infection ongoing ulceration.  Continue offloading for now.  Moisturizer daily. -Due to the chronic swelling will order venous reflux study. -Discussed increasing to 200 mg of gabapentin.  Also discussed she can contact her primary care physician regards to possibly increasing the dose.  Trula Slade DPM

## 2019-10-25 ENCOUNTER — Telehealth: Payer: Self-pay | Admitting: *Deleted

## 2019-10-25 DIAGNOSIS — R609 Edema, unspecified: Secondary | ICD-10-CM

## 2019-10-25 NOTE — Telephone Encounter (Signed)
Orders faxed to [facility]. 

## 2019-10-25 NOTE — Telephone Encounter (Signed)
-----   Message from Trula Slade, DPM sent at 10/24/2019  4:40 PM EST ----- Can you please order a venous reflux study? Thanks.

## 2019-10-30 DIAGNOSIS — W19XXXA Unspecified fall, initial encounter: Secondary | ICD-10-CM | POA: Diagnosis not present

## 2019-10-30 DIAGNOSIS — G501 Atypical facial pain: Secondary | ICD-10-CM | POA: Diagnosis not present

## 2019-11-05 ENCOUNTER — Other Ambulatory Visit: Payer: Self-pay

## 2019-11-05 ENCOUNTER — Ambulatory Visit (HOSPITAL_COMMUNITY)
Admission: RE | Admit: 2019-11-05 | Discharge: 2019-11-05 | Disposition: A | Discharge status: Home / Self Care | Payer: Medicare HMO | Source: Ambulatory Visit | Attending: Cardiovascular Disease | Admitting: Cardiovascular Disease

## 2019-11-05 DIAGNOSIS — R600 Localized edema: Secondary | ICD-10-CM

## 2019-11-05 DIAGNOSIS — R609 Edema, unspecified: Secondary | ICD-10-CM | POA: Diagnosis not present

## 2019-11-08 ENCOUNTER — Other Ambulatory Visit: Payer: Self-pay

## 2019-11-08 NOTE — Patient Outreach (Signed)
[facility]) Care Management  [place]  11/08/2019   [name] 05-Apr-1941 [id number]  Subjective: Telephone call to patient. She reports she is doing ok.  She states she is dealing with some nerve pain to left leg/knee area and is due to see neurologist on 11-12-2019.  Patient taking Neurontin for pain it works but does not last.  Patient reports that her blood pressures are good. Patient never gave readings but agrees it is below 140/80.  Discussed diet and medication compliance as ways to keep pressure under control.  She verbalized understanding and denies any needs.    Objective:   Encounter Medications:  Outpatient Encounter Medications as of 11/08/2019  Medication Sig Note  . albuterol (VENTOLIN HFA) 108 (90 Base) MCG/ACT inhaler Inhale 2 puffs into the lungs every 6 (six) hours as needed for wheezing or shortness of breath. 08/26/2019: Reports has not needed recently  . ALPRAZolam (XANAX) 0.5 MG tablet Take 0.5 mg by mouth at bedtime.    Marland Kitchen amLODipine (NORVASC) 2.5 MG tablet Take 2.5 mg by mouth every evening.    Marland Kitchen aspirin EC 81 MG tablet Take 81 mg by mouth daily.   Marland Kitchen buPROPion (WELLBUTRIN SR) 100 MG 12 hr tablet Take 100 mg by mouth daily.    Marland Kitchen EPINEPHrine 0.3 mg/0.3 mL IJ SOAJ injection Inject 0.3 mLs (0.3 mg total) into the muscle once as needed for anaphylaxis. 08/26/2019: Has not needed recently  . famotidine (PEPCID) 20 MG tablet TAKE (1) TABLET DAILY AT BEDTIME.   . fluticasone (FLONASE) 50 MCG/ACT nasal spray Place 2 sprays into both nostrils daily.   Marland Kitchen gabapentin (NEURONTIN) 100 MG capsule Take 1 capsule (100 mg total) by mouth every 8 (eight) hours as needed. (Patient taking differently: Take 100 mg by mouth 3 (three) times daily as needed (for pain). ) 08/26/2019: 08/26/2019: reports taking as needed  . levothyroxine (SYNTHROID) 75 MCG tablet    . losartan (COZAAR) 50 MG tablet Take 50 mg by mouth daily.  08/26/2019: Reports taking 25 mg po QD   . Multiple Vitamin (MULTIVITAMIN) tablet Take 1 tablet by mouth daily.   . pantoprazole (PROTONIX) 40 MG tablet TAKE 1 TABLET BY MOUTH ONCE DAILY 30 TO 60 MINUTES BEFORE FIRST MEAL OF THE DAY.   . pravastatin (PRAVACHOL) 10 MG tablet Take 10 mg by mouth daily.    . raloxifene (EVISTA) 60 MG tablet TAKE 1 TABLET BY MOUTH EVERY MORNING.   . SYMBICORT 80-4.5 MCG/ACT inhaler INHALE 2 PUFFS INTO THE LUNGS TWICE DAILY. 08/26/2019: Reports using QD in am  . traMADol (ULTRAM) 50 MG tablet Take 1 tablet by mouth daily as needed for moderate pain or severe pain.  08/26/2019: Uses rarely per patient report   . triamterene-hydrochlorothiazide (MAXZIDE-25) 37.5-25 MG tablet Take 1 tablet by mouth daily. 08/26/2019: Reports was told by PCP on 08/24/2019 to continue taking this medication "as needed for fluid retention"   . valACYclovir (VALTREX) 500 MG tablet Take 500 mg by mouth daily.  08/26/2019: Reports taking as needed   No facility-administered encounter medications on file as of 11/08/2019.     Functional Status:  In your present state of health, do you have any difficulty performing the following activities: 08/26/2019 08/13/2019  Hearing? Y -  Comment Wears bilateral hearing aides -  Vision? N -  Difficulty concentrating or making decisions? - N  Walking or climbing stairs? - N  Dressing or bathing? - N  Doing errands, shopping? - N  Preparing Food and eating ? - N  Using the Toilet? - N  In the past six months, have you accidently leaked urine? - Y  Comment - wears liner pads for occasional urine leaks  Do you have problems with loss of bowel control? - N  Managing your Medications? - N  Managing your Finances? - N  Housekeeping or managing your Housekeeping? - N  Some recent data might be hidden    Fall/Depression Screening: Fall Risk  09/13/2019 08/26/2019 08/13/2019  Falls in the past year? 0 (No Data) 0  Comment - Falls screening previously completed- patient denies new/ recent falls since  last [facility] RN CM outreach -  Number falls in past yr: - - 0  Comment - - No falls over last year  Injury with Fall? - - 0  Comment - - N/A; denies falls  Risk for fall due to : - - Impaired balance/gait;Medication side effect  Follow up - - Falls prevention discussed   PHQ 2/9 Scores 08/13/2019  PHQ - 2 Score 0    Assessment: Patient continues to manage chronic health issues.  No recent hospitalizations.    Plan:  [facility] CM Care Plan Problem One     Most Recent Value  Care Plan Problem One  Patient at risk for cellulitis due to callus to right foot.    Role Documenting the Problem One  Care Management Coordinator  Care Plan for Problem One  Active  THN Long Term Goal   Over the next 30 days, will continue to monitor for signs of cellulitis.   THN Long Term Goal Start Date  11/08/19  Interventions for Problem One Long Term Goal  Patient able to describe redness and swelling of signs of celluilits.   THN CM Short Term Goal #1   Over the next 30 days, patient will demonstrate and/or verbalize understanding of self-health management for HTN.   THN CM Short Term Goal #1 Start Date  11/08/19  Interventions for Short Term Goal #1  RN CM reviewed with patient importance medication adherence, low salt diet and exercise.     RN CM will contact patient next month and patient agreeable.    Jone Baseman, RN, MSN [place] Management Care Management Coordinator Direct Line [phone] Cell [phone] Toll Free: [phone]  Fax: [phone]

## 2019-11-12 ENCOUNTER — Ambulatory Visit (INDEPENDENT_AMBULATORY_CARE_PROVIDER_SITE_OTHER): Payer: Medicare HMO | Admitting: Neurology

## 2019-11-12 ENCOUNTER — Encounter: Payer: Self-pay | Admitting: Neurology

## 2019-11-12 ENCOUNTER — Other Ambulatory Visit: Payer: Self-pay

## 2019-11-12 VITALS — BP 156/82 | HR 63 | Temp 97.6°F | Ht 64.0 in | Wt 159.3 lb

## 2019-11-12 DIAGNOSIS — L03115 Cellulitis of right lower limb: Secondary | ICD-10-CM

## 2019-11-12 DIAGNOSIS — M79651 Pain in right thigh: Secondary | ICD-10-CM

## 2019-11-12 MED ORDER — MELOXICAM 7.5 MG PO TABS: 7.5000 mg | ORAL_TABLET | Freq: Every day | ORAL | 3 refills | Status: DC

## 2019-11-12 NOTE — Progress Notes (Signed)
[facility]  PATIENT: [name] DOB: [birth year]  REFERRING DOCTOR OR PCP:  Referred by [facility] (Dr. Alvan Dame); PCP is Dr. Nevada Crane SOURCE: patient, notes from ortho, multiple imaging and lab reports, CT head images personally reviewed.   _________________________________   HISTORICAL  CHIEF COMPLAINT:  Chief Complaint  Patient presents with  . New Patient (Initial Visit)    Rm 12 referred by Paralee Cancel, MD for Right leg nerve pain- pt states the pain is severe one day and the next day the pain could be less, pt states the pain radiates from her right knee to her groin.     HISTORY OF PRESENT ILLNESS:  I had the pleasure of seeing your patient, [name], at [facility] for neurologic consultation regarding her right leg pain.  She is a 78 year old woman who has had right leg pain since 2018 with more severe symptoms in September 2020.    She first noted pain in a spot on the bottom of her great toe.   She reports a a podiatrist who took x-rays and she was told she had a callus.  She had a shaving procedure in the toe without benefit.  Pain persisted at the same level over the next 2 years.  In early September 2020, while shopping she felt very cold and had a headache.  She returned home and rested but continues to have the headache.   Headache was holocephalic and severe.  She had a fever of 103 and chills.  She went to the Urgent Care in [place], [place].   She was sent to the ED at [facility]  She had a head CT showing no acute findings but did show age related atrophy and small vessel ischemic change.    She was admitted to the hospital.  Later that night, she noted that the right leg became more painful and was very red. The leg was shown to the hospitalist and cellulitis was diagnosed.    She reports pictures of the leg were taken to get a second opinion.  She received antibiotics and the leg looked better over the next few days.    She  she states been told by her podiatrist that the great toe sore caused the cellulitis.     Currently, the cellulitis in the leg has resolved but there is still a black spot on the great toe.   She wears a bandage and follows up with Podiatry.    She is currently experiencing pain in the leg behind the kneecap and up to the inner upper thigh to the groin.    Mashing on the back of the knee and inner thigh increase the pain.  Gabapentin has not helped the pain and she does not tolerate it.    She reports she was sent to [facility] to determine if there was a problem higher up in the leg.    She was thought not to have an orthopedic issue and is being referred to [place] for an opinion regarding the possibility of a neuropathic process.  She has a long history of edema in her legs, ankles and feet, better in the morning and worse at night.  She wears support hose.  Doppler study of the right leg 11/05/2019 did not show any DVT.     DVT 08/20/19 also did not show DVT or cyst in the popliteal fossa.    REVIEW OF SYSTEMS: Constitutional: No fevers, chills, sweats, or change  in appetite.  She has edema in both legs. Eyes: No visual changes, double vision, eye pain Ear, nose and throat: No hearing loss, ear pain, nasal congestion, sore throat Cardiovascular: No chest pain, palpitations Respiratory: No shortness of breath at rest or with exertion.   She has asthma and is on inhalers. GastrointestinaI: No nausea, vomiting, diarrhea, abdominal pain, fecal incontinence Genitourinary: No dysuria, urinary retention or frequency.  No nocturia.  She reports GERD. Musculoskeletal: No neck pain, back pain Integumentary: No rash, pruritus, skin lesions Neurological: as above Psychiatric: She noticed some depression.  She has some anxiety. Endocrine: She has hypothyroidism.  T Hematologic/Lymphatic: No anemia, purpura, petechiae. Allergic/Immunologic: She has seasonal allergies.  ALLERGIES: Allergies  Allergen  Reactions  . Amoxicillin-Pot Clavulanate Nausea And Vomiting, Other (See Comments), Itching and Nausea Only    Has patient had a PCN reaction causing immediate rash, facial/tongue/throat swelling, SOB or lightheadedness with hypotension: No Has patient had a PCN reaction causing severe rash involving mucus membranes or skin necrosis: No Has patient had a PCN reaction that required hospitalization: No Has patient had a PCN reaction occurring within the last 10 years: No If all of the above answers are "NO", then may proceed with Cephalosporin use. Has patient had a PCN reaction causing immediate rash, facial/tongue/throat swelling, SOB or lightheadedness with hypotension: No Has patient had a PCN reaction causing severe rash involving mucus membranes or skin necrosis: No Has patient had a PCN reaction that required hospitalization: No Has patient had a PCN reaction occurring within the last 10 years: No If all of the above answers are "NO", then may proceed with Cephalosporin use.   . Ace Inhibitors Cough  . Entex La Itching  . Levofloxacin Hives and Itching    HOME MEDICATIONS:  Current Outpatient Medications:  .  albuterol (VENTOLIN HFA) 108 (90 Base) MCG/ACT inhaler, Inhale 2 puffs into the lungs every 6 (six) hours as needed for wheezing or shortness of breath., Disp: , Rfl:  .  ALPRAZolam (XANAX) 0.5 MG tablet, Take 0.5 mg by mouth at bedtime. , Disp: , Rfl:  .  amLODipine (NORVASC) 2.5 MG tablet, Take 2.5 mg by mouth every evening. , Disp: , Rfl:  .  aspirin EC 81 MG tablet, Take 81 mg by mouth daily., Disp: , Rfl:  .  buPROPion (WELLBUTRIN SR) 100 MG 12 hr tablet, Take 100 mg by mouth daily. , Disp: , Rfl:  .  EPINEPHrine 0.3 mg/0.3 mL IJ SOAJ injection, Inject 0.3 mLs (0.3 mg total) into the muscle once as needed for anaphylaxis., Disp: 2 Device, Rfl: 1 .  famotidine (PEPCID) 20 MG tablet, TAKE (1) TABLET DAILY AT BEDTIME., Disp: 30 tablet, Rfl: 2 .  fluticasone (FLONASE) 50  MCG/ACT nasal spray, Place 2 sprays into both nostrils as needed. , Disp: , Rfl:  .  gabapentin (NEURONTIN) 100 MG capsule, Take 1 capsule (100 mg total) by mouth every 8 (eight) hours as needed. (Patient taking differently: Take 100 mg by mouth 2 (two) times daily. ), Disp: 60 capsule, Rfl: 3 .  levothyroxine (SYNTHROID) 75 MCG tablet, , Disp: , Rfl:  .  losartan (COZAAR) 50 MG tablet, Take 50 mg by mouth daily. , Disp: , Rfl:  .  Multiple Vitamin (MULTIVITAMIN) tablet, Take 1 tablet by mouth daily., Disp: , Rfl:  .  pantoprazole (PROTONIX) 40 MG tablet, TAKE 1 TABLET BY MOUTH ONCE DAILY 30 TO 60 MINUTES BEFORE FIRST MEAL OF THE DAY., Disp: 90 tablet, Rfl: 0 .  pravastatin (PRAVACHOL) 10 MG tablet, Take 10 mg by mouth daily. , Disp: , Rfl:  .  raloxifene (EVISTA) 60 MG tablet, TAKE 1 TABLET BY MOUTH EVERY MORNING. (Patient taking differently: Takes 1 every other day), Disp: 30 tablet, Rfl: 0 .  SYMBICORT 80-4.5 MCG/ACT inhaler, INHALE 2 PUFFS INTO THE LUNGS TWICE DAILY. (Patient taking differently: 2 puffs. QD am), Disp: 10.2 g, Rfl: 0 .  triamterene-hydrochlorothiazide (MAXZIDE-25) 37.5-25 MG tablet, Take 1 tablet by mouth daily., Disp: , Rfl:  .  valACYclovir (VALTREX) 500 MG tablet, Take 500 mg by mouth daily. , Disp: , Rfl:  .  traMADol (ULTRAM) 50 MG tablet, Take 1 tablet by mouth daily as needed for moderate pain or severe pain. , Disp: , Rfl:   PAST MEDICAL HISTORY: Past Medical History:  Diagnosis Date  . Anemia   . Anxiety   . Arthritis   . Asthma   . Constipation   . GERD (gastroesophageal reflux disease)   . High blood pressure   . Hypothyroidism   . Seasonal allergies   . Thyroid disease   . Varicose veins   . Vertigo 11/23/2013    PAST SURGICAL HISTORY: Past Surgical History:  Procedure Laterality Date  . APPENDECTOMY    . CATARACT EXTRACTION W/PHACO Left 01/27/2017   Procedure: CATARACT EXTRACTION PHACO AND INTRAOCULAR LENS PLACEMENT (IOC);  Surgeon: Tonny Branch, MD;   Location: [facility];  Service: Ophthalmology;  Laterality: Left;  CDE: 29.90  . CATARACT EXTRACTION W/PHACO Right 02/24/2017   Procedure: CATARACT EXTRACTION PHACO AND INTRAOCULAR LENS PLACEMENT (IOC);  Surgeon: Tonny Branch, MD;  Location: [facility];  Service: Ophthalmology;  Laterality: Right;  CDE: 8.57  . CHOLECYSTECTOMY    . COLONOSCOPY N/A 10/01/2016   Procedure: COLONOSCOPY;  Surgeon: Daneil Dolin, MD;  Location: [facility];  Service: Endoscopy;  Laterality: N/A;  815   . FLEXOR TENDON REPAIR Right 11/06/2017   Procedure: RIGHT WRIST FLEXOR TENDON REPAIRAND STT [place];  Surgeon: Leanora Cover, MD;  Location: [place];  Service: Orthopedics;  Laterality: Right;  . TUBAL LIGATION      FAMILY HISTORY: Family History  Problem Relation Age of Onset  . Hypertension Mother   . Varicose Veins Father   . Heart attack Father   . Heart disease Father   . Hypertension Brother   . COPD Brother     SOCIAL HISTORY:  Social History   Socioeconomic History  . Marital status: Married    Spouse name: Not on file  . Number of children: Not on file  . Years of education: Not on file  . Highest education level: Not on file  Occupational History  . Not on file  Tobacco Use  . Smoking status: Former Smoker    Packs/day: 0.25    Years: 5.00    Pack years: 1.25    Types: Cigarettes    Quit date: 12/02/1997    Years since quitting: 21.9  . Smokeless tobacco: Never Used  Substance and Sexual Activity  . Alcohol use: No    Alcohol/week: 1.0 standard drinks    Types: 1 Glasses of wine per week  . Drug use: No  . Sexual activity: Yes    Birth control/protection: Surgical    Comment: tubal  Other Topics Concern  . Not on file  Social History Narrative  . Not on file   Social Determinants of Health   Financial Resource Strain: Low Risk   . Difficulty of Paying Living Expenses:  Not hard at all  Food Insecurity: No Food Insecurity  . Worried About Charity fundraiser  in the Last Year: Never true  . Ran Out of Food in the Last Year: Never true  Transportation Needs: No Transportation Needs  . Lack of Transportation (Medical): No  . Lack of Transportation (Non-Medical): No  Physical Activity:   . Days of Exercise per Week: Not on file  . Minutes of Exercise per Session: Not on file  Stress:   . Feeling of Stress : Not on file  Social Connections:   . Frequency of Communication with Friends and Family: Not on file  . Frequency of Social Gatherings with Friends and Family: Not on file  . Attends Religious Services: Not on file  . Active Member of Clubs or Organizations: Not on file  . Attends Archivist Meetings: Not on file  . Marital Status: Not on file  Intimate Partner Violence:   . Fear of Current or Ex-Partner: Not on file  . Emotionally Abused: Not on file  . Physically Abused: Not on file  . Sexually Abused: Not on file     PHYSICAL EXAM  Vitals:   11/12/19 0903  BP: (!) 156/82  Pulse: 63  Temp: 97.6 F (36.4 C)  TempSrc: Temporal  Weight: 159 lb 5 oz (72.3 kg)  Height: 5\' 4"  (1.626 m)    Body mass index is 27.35 kg/m.   General: The patient is well-developed and well-nourished and in no acute distress  HEENT:  Head is [place]/AT.  Sclera are anicteric.  Funduscopic exam shows normal optic discs and retinal vessels.  Neck: No carotid bruits are noted.  The neck is nontender.  Cardiovascular: The heart has a regular rate and rhythm with a normal S1 and S2. There were no murmurs, gallops or rubs.    Skin/Extremities: Extremities are without rash.  She has edema in lower legs and feet.   She is tender to deep palpation just above the knee in the lower inner upper thigh.     .  Musculoskeletal:  Back is nontender  Neurologic Exam  Mental status: The patient is alert and oriented x 3 at the time of the examination. The patient has apparent normal recent and remote memory, with an apparently normal attention span and  concentration ability.   Speech is normal.  Cranial nerves: Extraocular movements are full.   There is good facial sensation to soft touch bilaterally.Facial strength is normal.  Trapezius and sternocleidomastoid strength is normal. No dysarthria is noted.    No obvious hearing deficits are noted.  Motor:  Muscle bulk is normal.   Tone is normal. Strength is  5 / 5 in all 4 extremities.   Sensory: Sensory testing is intact to pinprick, soft touch and vibration sensation in all 4 extremities.     Coordination: Cerebellar testing reveals good finger-nose-finger and heel-to-shin bilaterally.  Gait and station: Station is normal.   Gait is fairly normal.  Tandem gait is wide.  Romberg is negative.   Reflexes: Deep tendon reflexes are symmetric and normal in the arms and 1+ in the legs.   Plantar responses are flexor.    DIAGNOSTIC DATA (LABS, IMAGING, TESTING) - I reviewed patient records, labs, notes, testing and imaging myself where available.  Lab Results  Component Value Date   WBC 8.1 08/10/2019   HGB 10.2 (L) 08/10/2019   HCT 32.8 (L) 08/10/2019   MCV 99.7 08/10/2019   PLT 131 (L) 08/10/2019  ASSESSMENT AND PLAN  Pain of right thigh - Plan: MR FEMUR RIGHT W WO CONTRAST  Cellulitis of right lower extremity - Plan: MR FEMUR RIGHT W WO CONTRAST   In summary, [name] is a 78 year old woman with pain in the right inner thigh.  She is tender to deep palpation but has normal sensation in the skin.  Due to the pain not following any nerve or nerve root distribution leg hurting more with deep palpation, her pain is unlikely to be due to a neuropathic process.  It is possible it is related to her cellulitis that she experienced that was also worse on the inner aspect of the thigh.  We will check an MRI of the thigh on the right to determine if there is any evidence of inflammation or abscess.  I prescribed meloxicam to try to help her pain better.  Based on the results of  the MRI and her response to the meloxicam, consider referral.  A follow-up was not scheduled but she should call us or her other doctors if she has new or worsening symptoms.  Thank you for asking me to see [name].  Please let me know if I can be of further assistance with her or the patient's future.   Natlie Asfour A. Felecia Shelling, MD, [facility] [id number], [id number] AM Certified in Neurology, Clinical Neurophysiology, Sleep Medicine and Neuroimaging  [facility] [address], [place], [place] [address] [phone]

## 2019-11-15 ENCOUNTER — Telehealth: Payer: Self-pay | Admitting: *Deleted

## 2019-11-15 NOTE — Telephone Encounter (Signed)
-----   Message from Trula Slade, DPM sent at 11/12/2019  2:32 PM EST ----- Val- please let her know that the venous reflux study was mostly normal and no reflux noted. One of the veins is small in size but otherwise normal.

## 2019-11-15 NOTE — Telephone Encounter (Signed)
I informed pt of Dr. Leigh Aurora review of results. Pt asked if her compression hose would help. I told pt to be certain to put the hose on prior to getting out of bed, this would help train the veins to hold the swelling out of the legs. Pt states understanding.

## 2019-11-17 DIAGNOSIS — Z85828 Personal history of other malignant neoplasm of skin: Secondary | ICD-10-CM | POA: Diagnosis not present

## 2019-11-17 DIAGNOSIS — Z08 Encounter for follow-up examination after completed treatment for malignant neoplasm: Secondary | ICD-10-CM | POA: Diagnosis not present

## 2019-11-23 ENCOUNTER — Telehealth: Payer: Self-pay | Admitting: Neurology

## 2019-11-23 NOTE — Telephone Encounter (Signed)
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 Please advise. Pt states if she does not pick up please leave a detailed message on answering machine.

## 2019-11-23 NOTE — Telephone Encounter (Signed)
Tried calling pt back, phone continued to ring and I could not LVM. I was going to ask if she had MRI scheduled already. Dr. Felecia Shelling out of office until 11/29/19.

## 2019-11-24 NOTE — Telephone Encounter (Signed)
Called and spoke with pt. She states meloxicam helping pain and wondering if she should still proceed with MRI. It is approved but she wanted to hold off on scheduling to see what Dr. Felecia Shelling recommends she do. Advised he is out of the office this week. He will be back next week. I will send him a message and f/u with her next week.

## 2019-11-26 NOTE — Telephone Encounter (Signed)
If pain has resolved or is almost resolved, we can hold off --- if pain is still bothersome, we should proceed

## 2019-11-29 NOTE — Telephone Encounter (Signed)
Called and spoke with pt. Her sx have resolved. She will hold off on MRI for now. She is aware to call if sx return or if she has any new sx.

## 2019-11-30 DIAGNOSIS — I10 Essential (primary) hypertension: Secondary | ICD-10-CM | POA: Diagnosis not present

## 2019-11-30 DIAGNOSIS — R42 Dizziness and giddiness: Secondary | ICD-10-CM | POA: Diagnosis not present

## 2019-12-07 DIAGNOSIS — F329 Major depressive disorder, single episode, unspecified: Secondary | ICD-10-CM | POA: Diagnosis not present

## 2019-12-07 DIAGNOSIS — I10 Essential (primary) hypertension: Secondary | ICD-10-CM | POA: Diagnosis not present

## 2019-12-14 ENCOUNTER — Ambulatory Visit: Payer: Self-pay

## 2019-12-23 ENCOUNTER — Other Ambulatory Visit: Payer: Self-pay

## 2019-12-23 NOTE — Patient Outreach (Signed)
[facility]) Care Management  12/23/2019  [name] 03-15-1941 [id number]   Telephone call to patient for disease management check in. Patient states she cannot talk right now. Advised patient that CM would call back another time.  She states she is doing well right now.   Plan: RN CM will attempt patient in the month of February.  Jone Baseman, RN, MSN [place] Management Care Management Coordinator Direct Line [phone] Cell [phone] Toll Free: [phone]  Fax: [phone]

## 2020-01-18 ENCOUNTER — Ambulatory Visit: Payer: Medicare HMO | Admitting: Podiatry

## 2020-01-18 ENCOUNTER — Other Ambulatory Visit: Payer: Self-pay

## 2020-01-18 NOTE — Patient Outreach (Signed)
[facility]) Care Management  [place]  01/18/2020   [name] 1941-02-08 [id number]  Subjective: Telephone call to patient for disease management follow up. Patient reports that she had her second cOVID-19 vaccine yesterday and is not feeling her best. She reports that she has just taken some tylenol and just lying low right now.  Advised patient that if she feels worse to notify her physician.  She verbalized understanding. Patient reports blood pressure doing good.  Discussed importance of controlling blood pressure.  She verbalized understanding and denies any needs.    Objective:   Encounter Medications:  Outpatient Encounter Medications as of 01/18/2020  Medication Sig Note  . albuterol (VENTOLIN HFA) 108 (90 Base) MCG/ACT inhaler Inhale 2 puffs into the lungs every 6 (six) hours as needed for wheezing or shortness of breath. 08/26/2019: Reports has not needed recently  . ALPRAZolam (XANAX) 0.5 MG tablet Take 0.5 mg by mouth at bedtime.    Marland Kitchen amLODipine (NORVASC) 2.5 MG tablet Take 2.5 mg by mouth every evening.    Marland Kitchen aspirin EC 81 MG tablet Take 81 mg by mouth daily.   Marland Kitchen buPROPion (WELLBUTRIN SR) 100 MG 12 hr tablet Take 100 mg by mouth daily.    Marland Kitchen EPINEPHrine 0.3 mg/0.3 mL IJ SOAJ injection Inject 0.3 mLs (0.3 mg total) into the muscle once as needed for anaphylaxis. 08/26/2019: Has not needed recently  . famotidine (PEPCID) 20 MG tablet TAKE (1) TABLET DAILY AT BEDTIME.   . fluticasone (FLONASE) 50 MCG/ACT nasal spray Place 2 sprays into both nostrils as needed.    . gabapentin (NEURONTIN) 100 MG capsule Take 1 capsule (100 mg total) by mouth every 8 (eight) hours as needed. (Patient taking differently: Take 100 mg by mouth 2 (two) times daily. ) 08/26/2019: 08/26/2019: reports taking as needed  . levothyroxine (SYNTHROID) 75 MCG tablet    . losartan (COZAAR) 50 MG tablet Take 50 mg by mouth daily.  08/26/2019: Reports taking 25 mg po QD  . meloxicam  (MOBIC) 7.5 MG tablet Take 1 tablet (7.5 mg total) by mouth daily.   . Multiple Vitamin (MULTIVITAMIN) tablet Take 1 tablet by mouth daily.   . pantoprazole (PROTONIX) 40 MG tablet TAKE 1 TABLET BY MOUTH ONCE DAILY 30 TO 60 MINUTES BEFORE FIRST MEAL OF THE DAY.   . pravastatin (PRAVACHOL) 10 MG tablet Take 10 mg by mouth daily.    . raloxifene (EVISTA) 60 MG tablet TAKE 1 TABLET BY MOUTH EVERY MORNING. (Patient taking differently: Takes 1 every other day)   . SYMBICORT 80-4.5 MCG/ACT inhaler INHALE 2 PUFFS INTO THE LUNGS TWICE DAILY. (Patient taking differently: 2 puffs. QD am) 08/26/2019: Reports using QD in am  . triamterene-hydrochlorothiazide (MAXZIDE-25) 37.5-25 MG tablet Take 1 tablet by mouth daily. 08/26/2019: Reports was told by PCP on 08/24/2019 to continue taking this medication "as needed for fluid retention"   . valACYclovir (VALTREX) 500 MG tablet Take 500 mg by mouth daily.  08/26/2019: Reports taking as needed   No facility-administered encounter medications on file as of 01/18/2020.    Functional Status:  In your present state of health, do you have any difficulty performing the following activities: 08/26/2019 08/13/2019  Hearing? Y -  Comment Wears bilateral hearing aides -  Vision? N -  Difficulty concentrating or making decisions? - N  Walking or climbing stairs? - N  Dressing or bathing? - N  Doing errands, shopping? - N  Conservation officer, nature and eating ? -  N  Using the Toilet? - N  In the past six months, have you accidently leaked urine? - Y  Comment - wears liner pads for occasional urine leaks  Do you have problems with loss of bowel control? - N  Managing your Medications? - N  Managing your Finances? - N  Housekeeping or managing your Housekeeping? - N  Some recent data might be hidden    Fall/Depression Screening: Fall Risk  09/13/2019 08/26/2019 08/13/2019  Falls in the past year? 0 (No Data) 0  Comment - Falls screening previously completed- patient denies new/  recent falls since last [facility] RN CM outreach -  Number falls in past yr: - - 0  Comment - - No falls over last year  Injury with Fall? - - 0  Comment - - N/A; denies falls  Risk for fall due to : - - Impaired balance/gait;Medication side effect  Follow up - - Falls prevention discussed   PHQ 2/9 Scores 08/13/2019  PHQ - 2 Score 0    Assessment:  Patient feeling affects of second COVID-19 vaccine.  Patient continues to manage blood pressure.    Plan:  [facility] CM Care Plan Problem One     Most Recent Value  Care Plan Problem One  Patient at risk for cellulitis due to callus to right foot.    Role Documenting the Problem One  Care Management Coordinator  Care Plan for Problem One  Active  THN Long Term Goal   Over the next 30 days, will continue to monitor for signs of cellulitis.   THN Long Term Goal Start Date  11/08/19  THN Long Term Goal Met Date  01/18/20  THN CM Short Term Goal #1   Over the next 30 days, patient will demonstrate and/or verbalize understanding of self-health management for HTN.   THN CM Short Term Goal #1 Start Date  01/18/20  Interventions for Short Term Goal #1  Patient reports that her blood pressure has been fine. Discussed blood pressure control and limiting salt intake.       RN CM will contact in the month of April and patient agreeable.    Jone Baseman, RN, MSN [place] Management Care Management Coordinator Direct Line [phone] Cell [phone] Toll Free: [phone]  Fax: [phone]

## 2020-01-19 DIAGNOSIS — J029 Acute pharyngitis, unspecified: Secondary | ICD-10-CM | POA: Diagnosis not present

## 2020-01-20 ENCOUNTER — Ambulatory Visit: Payer: Self-pay

## 2020-01-26 ENCOUNTER — Other Ambulatory Visit: Payer: Self-pay | Admitting: Adult Health

## 2020-02-01 ENCOUNTER — Encounter: Payer: Self-pay | Admitting: Podiatry

## 2020-02-01 ENCOUNTER — Ambulatory Visit: Payer: Medicare HMO | Admitting: Podiatry

## 2020-02-01 ENCOUNTER — Other Ambulatory Visit: Payer: Self-pay

## 2020-02-01 VITALS — Temp 97.3°F | Resp 16

## 2020-02-01 DIAGNOSIS — Z872 Personal history of diseases of the skin and subcutaneous tissue: Secondary | ICD-10-CM

## 2020-02-01 DIAGNOSIS — L84 Corns and callosities: Secondary | ICD-10-CM

## 2020-02-02 NOTE — Progress Notes (Signed)
Subjective: 79 year old female presents the office today for follow-up evaluation of preulcerative callus on the right foot.  Overall she states that she is doing well.  The calluses have a small dark spot but denies any opening and she denies any redness or drainage or any signs of infection.  She is been wearing the shoes with inserts which is been helpful as well as apply an offloading pad.  She was on gabapentin but this was not helpful for her leg.  She was on meloxicam and this took away her leg pain.  She is not taking meloxicam anymore.  She has no new concerns today.   Objective: AAO x3, NAD DP/PT pulses palpable bilaterally, CRT less than 3 seconds Hyperkeratotic lesion medial first MPJ on the right foot as well as plantar medial right hallux without any underlying ulceration identified there is no signs of infection.  Along the medial hallux very small area of dried blood present but there is no underlying ulceration identified today and there is no signs of infection.  There is no pain with calf compression, erythema pumps.  Assessment: Preulcerative calluses right foot; neuritis  Plan: -All treatment options discussed with the patient including all alternatives, risks, complications.  -Debrided the hyperkeratotic lesions without any complications or bleeding.  Continue with offloading and moisturizer daily.  Monitor for any signs or symptoms of infection. -Reviewed venous duplex as well as venous reflux study.  She has compression socks that she is going to continue to wear.  Return if symptoms worsen or fail to improve.  Recommend at least follow-up in 6 months.  Trula Slade DPM

## 2020-02-10 DIAGNOSIS — G501 Atypical facial pain: Secondary | ICD-10-CM | POA: Diagnosis not present

## 2020-02-10 DIAGNOSIS — R21 Rash and other nonspecific skin eruption: Secondary | ICD-10-CM | POA: Diagnosis not present

## 2020-02-10 DIAGNOSIS — L03115 Cellulitis of right lower limb: Secondary | ICD-10-CM | POA: Diagnosis not present

## 2020-02-10 DIAGNOSIS — E785 Hyperlipidemia, unspecified: Secondary | ICD-10-CM | POA: Diagnosis not present

## 2020-02-10 DIAGNOSIS — Z0001 Encounter for general adult medical examination with abnormal findings: Secondary | ICD-10-CM | POA: Diagnosis not present

## 2020-02-10 DIAGNOSIS — Z0000 Encounter for general adult medical examination without abnormal findings: Secondary | ICD-10-CM | POA: Diagnosis not present

## 2020-02-10 DIAGNOSIS — E119 Type 2 diabetes mellitus without complications: Secondary | ICD-10-CM | POA: Diagnosis not present

## 2020-02-10 DIAGNOSIS — M545 Low back pain: Secondary | ICD-10-CM | POA: Diagnosis not present

## 2020-02-10 DIAGNOSIS — L209 Atopic dermatitis, unspecified: Secondary | ICD-10-CM | POA: Diagnosis not present

## 2020-02-10 DIAGNOSIS — R7301 Impaired fasting glucose: Secondary | ICD-10-CM | POA: Diagnosis not present

## 2020-02-10 DIAGNOSIS — M79674 Pain in right toe(s): Secondary | ICD-10-CM | POA: Diagnosis not present

## 2020-02-14 DIAGNOSIS — J45909 Unspecified asthma, uncomplicated: Secondary | ICD-10-CM | POA: Diagnosis not present

## 2020-02-14 DIAGNOSIS — K219 Gastro-esophageal reflux disease without esophagitis: Secondary | ICD-10-CM | POA: Diagnosis not present

## 2020-02-14 DIAGNOSIS — M5430 Sciatica, unspecified side: Secondary | ICD-10-CM | POA: Diagnosis not present

## 2020-02-14 DIAGNOSIS — E782 Mixed hyperlipidemia: Secondary | ICD-10-CM | POA: Diagnosis not present

## 2020-02-14 DIAGNOSIS — F331 Major depressive disorder, recurrent, moderate: Secondary | ICD-10-CM | POA: Diagnosis not present

## 2020-02-14 DIAGNOSIS — E1169 Type 2 diabetes mellitus with other specified complication: Secondary | ICD-10-CM | POA: Diagnosis not present

## 2020-02-14 DIAGNOSIS — M8580 Other specified disorders of bone density and structure, unspecified site: Secondary | ICD-10-CM | POA: Diagnosis not present

## 2020-02-14 DIAGNOSIS — E039 Hypothyroidism, unspecified: Secondary | ICD-10-CM | POA: Diagnosis not present

## 2020-02-14 DIAGNOSIS — I10 Essential (primary) hypertension: Secondary | ICD-10-CM | POA: Diagnosis not present

## 2020-02-21 DIAGNOSIS — F331 Major depressive disorder, recurrent, moderate: Secondary | ICD-10-CM | POA: Diagnosis not present

## 2020-03-06 DIAGNOSIS — F331 Major depressive disorder, recurrent, moderate: Secondary | ICD-10-CM | POA: Diagnosis not present

## 2020-03-20 ENCOUNTER — Other Ambulatory Visit: Payer: Self-pay

## 2020-03-20 NOTE — Patient Outreach (Signed)
[facility]) Care Management  03/20/2020  [name] 1941/09/15 [id number]   Telephone call to patient for disease management follow up. Female answered stating patient not in.  CM contact information given for return call.   Plan: RN CM will send letter and attempt again in the month of June.   Jone Baseman, RN, MSN [place] Management Care Management Coordinator Direct Line [phone] Cell [phone] Toll Free: [phone]  Fax: [phone]

## 2020-03-21 ENCOUNTER — Ambulatory Visit: Payer: Self-pay

## 2020-03-21 DIAGNOSIS — R519 Headache, unspecified: Secondary | ICD-10-CM | POA: Diagnosis not present

## 2020-03-21 DIAGNOSIS — F331 Major depressive disorder, recurrent, moderate: Secondary | ICD-10-CM | POA: Diagnosis not present

## 2020-03-21 DIAGNOSIS — F411 Generalized anxiety disorder: Secondary | ICD-10-CM | POA: Diagnosis not present

## 2020-03-21 DIAGNOSIS — M542 Cervicalgia: Secondary | ICD-10-CM | POA: Diagnosis not present

## 2020-03-22 ENCOUNTER — Ambulatory Visit (HOSPITAL_COMMUNITY)
Admission: RE | Admit: 2020-03-22 | Discharge: 2020-03-22 | Disposition: A | Discharge status: Home / Self Care | Payer: Medicare HMO | Source: Ambulatory Visit | Attending: Adult Health Nurse Practitioner | Admitting: Adult Health Nurse Practitioner

## 2020-03-22 ENCOUNTER — Other Ambulatory Visit: Payer: Self-pay

## 2020-03-22 ENCOUNTER — Other Ambulatory Visit (HOSPITAL_COMMUNITY): Payer: Self-pay | Admitting: Adult Health Nurse Practitioner

## 2020-03-22 DIAGNOSIS — M47812 Spondylosis without myelopathy or radiculopathy, cervical region: Secondary | ICD-10-CM | POA: Diagnosis not present

## 2020-03-22 DIAGNOSIS — M542 Cervicalgia: Secondary | ICD-10-CM

## 2020-03-25 ENCOUNTER — Other Ambulatory Visit: Payer: Self-pay | Admitting: Adult Health

## 2020-03-27 ENCOUNTER — Telehealth: Payer: Self-pay | Admitting: Adult Health

## 2020-03-27 NOTE — Telephone Encounter (Signed)
Patient called stating that she asked [place] to send over a refill of her medication and she was told by them that [facility] has denied the medication refill. Patient would like to know why. Please contact pt

## 2020-03-28 DIAGNOSIS — M65332 Trigger finger, left middle finger: Secondary | ICD-10-CM | POA: Diagnosis not present

## 2020-03-28 DIAGNOSIS — M6530 Trigger finger, unspecified finger: Secondary | ICD-10-CM | POA: Insufficient documentation

## 2020-03-28 DIAGNOSIS — M65352 Trigger finger, left little finger: Secondary | ICD-10-CM | POA: Diagnosis not present

## 2020-03-28 DIAGNOSIS — M79642 Pain in left hand: Secondary | ICD-10-CM | POA: Diagnosis not present

## 2020-03-28 DIAGNOSIS — M65322 Trigger finger, left index finger: Secondary | ICD-10-CM | POA: Diagnosis not present

## 2020-03-28 NOTE — Telephone Encounter (Signed)
Called patient back and informed her that rx not refilled due to her not being seen since 2017. Helped patient schedule appointment for med refill. Her PCP does physical and labs. Pt states that she doesn't need anymore paps.

## 2020-04-03 ENCOUNTER — Encounter: Payer: Medicare HMO | Admitting: Adult Health

## 2020-04-10 ENCOUNTER — Other Ambulatory Visit: Payer: Self-pay

## 2020-04-10 ENCOUNTER — Ambulatory Visit: Payer: Medicare HMO | Admitting: Adult Health

## 2020-04-10 ENCOUNTER — Encounter: Payer: Self-pay | Admitting: Adult Health

## 2020-04-10 VITALS — BP 161/86 | HR 61 | Ht 64.0 in | Wt 155.0 lb

## 2020-04-10 DIAGNOSIS — S20162A Insect bite (nonvenomous) of breast, left breast, initial encounter: Secondary | ICD-10-CM | POA: Diagnosis not present

## 2020-04-10 DIAGNOSIS — W57XXXA Bitten or stung by nonvenomous insect and other nonvenomous arthropods, initial encounter: Secondary | ICD-10-CM

## 2020-04-10 DIAGNOSIS — Z760 Encounter for issue of repeat prescription: Secondary | ICD-10-CM | POA: Insufficient documentation

## 2020-04-10 MED ORDER — DOXYCYCLINE HYCLATE 100 MG PO TABS: 100.0000 mg | ORAL_TABLET | Freq: Two times a day (BID) | ORAL | 0 refills | Status: DC

## 2020-04-10 MED ORDER — RALOXIFENE HCL 60 MG PO TABS: 60.0000 mg | ORAL_TABLET | Freq: Every morning | ORAL | 12 refills | Status: AC

## 2020-04-10 NOTE — Progress Notes (Signed)
  Subjective:     Patient ID: [name], female   DOB: 20-Jul-1941, 79 y.o.   MRN: [id number]  HPI [name] is a 79 year old white female, married, PM in to get refills on Evista and has spot on left breast, ?tick bite.She cares for husband who has dementia. PCP is Dr Nevada Crane.  Review of Systems Has itchy spot left breast, had ?tiak bite, she said she got out black head of tick she thinks, has used Cortaid  and polysporin,had normal mammogram 08/16/2019. Reviewed past medical,surgical, social and family history. Reviewed medications and allergies.     Objective:   Physical Exam Skin warm and dry. Lungs: clear to ausculation bilaterally. Cardiovascular: regular rate and rhythm.   Breasts:no dominate palpable mass, retraction or nipple discharge, has 1 cm red, firm area with dark center, in skin at 5 0' clock left breast    Assessment:     1. Tick bite, initial encounter Will rx doxycycline  Meds ordered this encounter  Medications  . raloxifene (EVISTA) 60 MG tablet    Sig: Take 1 tablet (60 mg total) by mouth every morning.    Dispense:  30 tablet    Refill:  12    Order Specific Question:   Supervising Provider    Answer:   Elonda Husky, LUTHER H [[id number]]  . doxycycline (VIBRA-TABS) 100 MG tablet    Sig: Take 1 tablet (100 mg total) by mouth 2 (two) times daily.    Dispense:  28 tablet    Refill:  0    Order Specific Question:   Supervising Provider    Answer:   Elonda Husky, LUTHER H [[id number]]    2. Medication refill Refilled evista    Plan:     Recheck left breast in 2 weeks

## 2020-04-11 DIAGNOSIS — M7062 Trochanteric bursitis, left hip: Secondary | ICD-10-CM | POA: Diagnosis not present

## 2020-04-11 DIAGNOSIS — M47892 Other spondylosis, cervical region: Secondary | ICD-10-CM | POA: Diagnosis not present

## 2020-04-11 DIAGNOSIS — M542 Cervicalgia: Secondary | ICD-10-CM | POA: Diagnosis not present

## 2020-04-11 DIAGNOSIS — M4312 Spondylolisthesis, cervical region: Secondary | ICD-10-CM | POA: Diagnosis not present

## 2020-04-11 DIAGNOSIS — M4712 Other spondylosis with myelopathy, cervical region: Secondary | ICD-10-CM | POA: Diagnosis not present

## 2020-04-14 DIAGNOSIS — M9905 Segmental and somatic dysfunction of pelvic region: Secondary | ICD-10-CM | POA: Diagnosis not present

## 2020-04-14 DIAGNOSIS — M9902 Segmental and somatic dysfunction of thoracic region: Secondary | ICD-10-CM | POA: Diagnosis not present

## 2020-04-14 DIAGNOSIS — M25552 Pain in left hip: Secondary | ICD-10-CM | POA: Diagnosis not present

## 2020-04-14 DIAGNOSIS — M9903 Segmental and somatic dysfunction of lumbar region: Secondary | ICD-10-CM | POA: Diagnosis not present

## 2020-04-14 DIAGNOSIS — M546 Pain in thoracic spine: Secondary | ICD-10-CM | POA: Diagnosis not present

## 2020-04-14 DIAGNOSIS — M545 Low back pain: Secondary | ICD-10-CM | POA: Diagnosis not present

## 2020-04-17 DIAGNOSIS — R293 Abnormal posture: Secondary | ICD-10-CM | POA: Diagnosis not present

## 2020-04-17 DIAGNOSIS — M2569 Stiffness of other specified joint, not elsewhere classified: Secondary | ICD-10-CM | POA: Diagnosis not present

## 2020-04-17 DIAGNOSIS — M47892 Other spondylosis, cervical region: Secondary | ICD-10-CM | POA: Diagnosis not present

## 2020-04-17 DIAGNOSIS — M2559 Pain in other specified joint: Secondary | ICD-10-CM | POA: Diagnosis not present

## 2020-04-18 ENCOUNTER — Other Ambulatory Visit: Payer: Self-pay | Admitting: Orthopaedic Surgery

## 2020-04-18 DIAGNOSIS — M4312 Spondylolisthesis, cervical region: Secondary | ICD-10-CM

## 2020-04-19 DIAGNOSIS — M9902 Segmental and somatic dysfunction of thoracic region: Secondary | ICD-10-CM | POA: Diagnosis not present

## 2020-04-19 DIAGNOSIS — M546 Pain in thoracic spine: Secondary | ICD-10-CM | POA: Diagnosis not present

## 2020-04-19 DIAGNOSIS — M25552 Pain in left hip: Secondary | ICD-10-CM | POA: Diagnosis not present

## 2020-04-19 DIAGNOSIS — M9903 Segmental and somatic dysfunction of lumbar region: Secondary | ICD-10-CM | POA: Diagnosis not present

## 2020-04-19 DIAGNOSIS — M9905 Segmental and somatic dysfunction of pelvic region: Secondary | ICD-10-CM | POA: Diagnosis not present

## 2020-04-19 DIAGNOSIS — M545 Low back pain: Secondary | ICD-10-CM | POA: Diagnosis not present

## 2020-04-20 DIAGNOSIS — M2559 Pain in other specified joint: Secondary | ICD-10-CM | POA: Diagnosis not present

## 2020-04-20 DIAGNOSIS — M47892 Other spondylosis, cervical region: Secondary | ICD-10-CM | POA: Diagnosis not present

## 2020-04-20 DIAGNOSIS — M2569 Stiffness of other specified joint, not elsewhere classified: Secondary | ICD-10-CM | POA: Diagnosis not present

## 2020-04-20 DIAGNOSIS — R293 Abnormal posture: Secondary | ICD-10-CM | POA: Diagnosis not present

## 2020-04-25 ENCOUNTER — Encounter: Payer: Self-pay | Admitting: Adult Health

## 2020-04-25 ENCOUNTER — Ambulatory Visit: Payer: Medicare HMO | Admitting: Adult Health

## 2020-04-25 VITALS — BP 142/90 | HR 62 | Ht 64.0 in | Wt 152.8 lb

## 2020-04-25 DIAGNOSIS — W57XXXD Bitten or stung by nonvenomous insect and other nonvenomous arthropods, subsequent encounter: Secondary | ICD-10-CM

## 2020-04-25 DIAGNOSIS — M47892 Other spondylosis, cervical region: Secondary | ICD-10-CM | POA: Diagnosis not present

## 2020-04-25 DIAGNOSIS — S20162D Insect bite (nonvenomous) of breast, left breast, subsequent encounter: Secondary | ICD-10-CM

## 2020-04-25 DIAGNOSIS — M2559 Pain in other specified joint: Secondary | ICD-10-CM | POA: Diagnosis not present

## 2020-04-25 DIAGNOSIS — R293 Abnormal posture: Secondary | ICD-10-CM | POA: Diagnosis not present

## 2020-04-25 DIAGNOSIS — M2569 Stiffness of other specified joint, not elsewhere classified: Secondary | ICD-10-CM | POA: Diagnosis not present

## 2020-04-25 NOTE — Progress Notes (Signed)
  Subjective:     Patient ID: IMAGENE LUBERDA, female   DOB: [birth year], 79 y.o.   MRN: [id number]  HPI Genese is a 79 year old white female, married, PM back in follow up in tick bite left breast, she took doxycycline, and it is better. PCP is Dr Nevada Crane.   Review of Systems Itching stopped on breast, came back and then sopped Has leg cramps and is taking Magnesium   Reviewed past medical,surgical, social and family history. Reviewed medications and allergies.     Objective:   Physical Exam BP (!) 142/90 (BP Location: Left Arm, Patient Position: Sitting, Cuff Size: Normal)   Pulse 62   Ht 5\' 4"  (1.626 m)   Wt 152 lb 12.8 oz (69.3 kg)   BMI 26.23 kg/m   Skin warm and dry,  Breasts:no dominate palpable mass, retraction or nipple discharge, tick bite at 5 'clock left breast smaller, less red.    Assessment:     1. Tick bite, subsequent encounter Area left breast is smaller, and no longer itches    Plan:     Follow up prn

## 2020-04-26 DIAGNOSIS — M9903 Segmental and somatic dysfunction of lumbar region: Secondary | ICD-10-CM | POA: Diagnosis not present

## 2020-04-26 DIAGNOSIS — M25552 Pain in left hip: Secondary | ICD-10-CM | POA: Diagnosis not present

## 2020-04-26 DIAGNOSIS — M545 Low back pain: Secondary | ICD-10-CM | POA: Diagnosis not present

## 2020-04-26 DIAGNOSIS — M546 Pain in thoracic spine: Secondary | ICD-10-CM | POA: Diagnosis not present

## 2020-04-26 DIAGNOSIS — M9905 Segmental and somatic dysfunction of pelvic region: Secondary | ICD-10-CM | POA: Diagnosis not present

## 2020-04-26 DIAGNOSIS — M9902 Segmental and somatic dysfunction of thoracic region: Secondary | ICD-10-CM | POA: Diagnosis not present

## 2020-04-27 DIAGNOSIS — M2569 Stiffness of other specified joint, not elsewhere classified: Secondary | ICD-10-CM | POA: Diagnosis not present

## 2020-04-27 DIAGNOSIS — M2559 Pain in other specified joint: Secondary | ICD-10-CM | POA: Diagnosis not present

## 2020-04-27 DIAGNOSIS — M47892 Other spondylosis, cervical region: Secondary | ICD-10-CM | POA: Diagnosis not present

## 2020-04-27 DIAGNOSIS — R293 Abnormal posture: Secondary | ICD-10-CM | POA: Diagnosis not present

## 2020-05-02 DIAGNOSIS — Z0000 Encounter for general adult medical examination without abnormal findings: Secondary | ICD-10-CM | POA: Diagnosis not present

## 2020-05-02 DIAGNOSIS — D509 Iron deficiency anemia, unspecified: Secondary | ICD-10-CM | POA: Diagnosis not present

## 2020-05-02 DIAGNOSIS — R42 Dizziness and giddiness: Secondary | ICD-10-CM | POA: Diagnosis not present

## 2020-05-02 DIAGNOSIS — E782 Mixed hyperlipidemia: Secondary | ICD-10-CM | POA: Diagnosis not present

## 2020-05-02 DIAGNOSIS — B373 Candidiasis of vulva and vagina: Secondary | ICD-10-CM | POA: Diagnosis not present

## 2020-05-02 DIAGNOSIS — E039 Hypothyroidism, unspecified: Secondary | ICD-10-CM | POA: Diagnosis not present

## 2020-05-02 DIAGNOSIS — E1169 Type 2 diabetes mellitus with other specified complication: Secondary | ICD-10-CM | POA: Diagnosis not present

## 2020-05-02 DIAGNOSIS — R10813 Right lower quadrant abdominal tenderness: Secondary | ICD-10-CM | POA: Diagnosis not present

## 2020-05-02 DIAGNOSIS — D72829 Elevated white blood cell count, unspecified: Secondary | ICD-10-CM | POA: Diagnosis not present

## 2020-05-02 DIAGNOSIS — Z0001 Encounter for general adult medical examination with abnormal findings: Secondary | ICD-10-CM | POA: Diagnosis not present

## 2020-05-02 DIAGNOSIS — M545 Low back pain: Secondary | ICD-10-CM | POA: Diagnosis not present

## 2020-05-02 DIAGNOSIS — E119 Type 2 diabetes mellitus without complications: Secondary | ICD-10-CM | POA: Diagnosis not present

## 2020-05-02 DIAGNOSIS — R110 Nausea: Secondary | ICD-10-CM | POA: Diagnosis not present

## 2020-05-03 ENCOUNTER — Other Ambulatory Visit: Payer: Self-pay

## 2020-05-03 NOTE — Patient Outreach (Signed)
[facility]) Care Management  [place]  05/03/2020   [name] 1941-09-05 [id number]  Subjective: Telephone call to patient for disease management follow up. Patient reports that she is dealing with some colitis and vertigo.  She reports that she saw the doctor yesterday and was put on antibiotics.  She reports that she will follow up on Friday.  She reports also seeing a therapist for her neck.  She states her neck feels much better.  She reports some blood pressure fluctuations in her blood pressure with her vertigo but overall doing ok with her blood pressure.  Patient reports that care with her husband is more and now has paid help along with family.  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 She verbalized understanding.     Objective:   Encounter Medications:  Outpatient Encounter Medications as of 05/03/2020  Medication Sig Note  . albuterol (VENTOLIN HFA) 108 (90 Base) MCG/ACT inhaler Inhale 2 puffs into the lungs every 6 (six) hours as needed for wheezing or shortness of breath. 08/26/2019: Reports has not needed recently  . ALPRAZolam (XANAX) 0.5 MG tablet Take 0.5 mg by mouth at bedtime.    Marland Kitchen amLODipine (NORVASC) 2.5 MG tablet Take 2.5 mg by mouth every evening.    Marland Kitchen aspirin EC 81 MG tablet Take 81 mg by mouth daily.   . baclofen (LIORESAL) 10 MG tablet Take 5 mg by mouth 3 (three) times daily as needed.   Marland Kitchen EPINEPHrine 0.3 mg/0.3 mL IJ SOAJ injection Inject 0.3 mLs (0.3 mg total) into the muscle once as needed for anaphylaxis. 08/26/2019: Has not needed recently  . fluticasone (FLONASE) 50 MCG/ACT nasal spray Place 2 sprays into both nostrils as needed.    Marland Kitchen levothyroxine (SYNTHROID) 75 MCG tablet    . losartan (COZAAR) 50 MG tablet Take 25 mg by mouth daily.  08/26/2019: Reports taking 25 mg po QD  . MAGNESIUM PO Take 500 mg by mouth daily.   . Multiple Vitamin (MULTIVITAMIN) tablet Take 1 tablet by mouth daily.   . pantoprazole  (PROTONIX) 40 MG tablet TAKE 1 TABLET BY MOUTH ONCE DAILY 30 TO 60 MINUTES BEFORE FIRST MEAL OF THE DAY.   . raloxifene (EVISTA) 60 MG tablet Take 1 tablet (60 mg total) by mouth every morning.   . SYMBICORT 80-4.5 MCG/ACT inhaler INHALE 2 PUFFS INTO THE LUNGS TWICE DAILY. (Patient taking differently: 2 puffs. QD am) 08/26/2019: Reports using QD in am  . triamterene-hydrochlorothiazide (MAXZIDE-25) 37.5-25 MG tablet Take 1 tablet by mouth daily. 08/26/2019: Reports was told by PCP on 08/24/2019 to continue taking this medication "as needed for fluid retention"    No facility-administered encounter medications on file as of 05/03/2020.    Functional Status:  In your present state of health, do you have any difficulty performing the following activities: 08/26/2019 08/13/2019  Hearing? Y -  Comment Wears bilateral hearing aides -  Vision? N -  Difficulty concentrating or making decisions? - N  Walking or climbing stairs? - N  Dressing or bathing? - N  Doing errands, shopping? - N  Conservation officer, nature and eating ? - N  Using the Toilet? - N  In the past six months, have you accidently leaked urine? - Y  Comment - wears liner pads for occasional urine leaks  Do you have problems with loss of bowel control? - N  Managing your Medications? - N  Managing your Finances? - N  Housekeeping or managing your  Housekeeping? - N  Some recent data might be hidden    Fall/Depression Screening: Fall Risk  05/03/2020 01/18/2020 09/13/2019  Falls in the past year? 0 0 0  Comment - - -  Number falls in past yr: - - -  Comment - - -  Injury with Fall? - - -  Comment - - -  Risk for fall due to : - - -  Follow up - - -   PHQ 2/9 Scores 05/03/2020 04/10/2020 08/13/2019  PHQ - 2 Score 0 0 0  PHQ- 9 Score - 3 -    Assessment: Patient continues to manage chronic health issues while caring for Alzheimer's spouse.    Plan:  [facility] CM Care Plan Problem One     Most Recent Value  Care Plan Problem One  Patient at risk  for cellulitis due to callus to right foot.    Role Documenting the Problem One  Care Management Coordinator  Care Plan for Problem One  Active  THN Long Term Goal   Over the next 90 days, patient will demonstrate and/or verbalize understanding of self-health management for HTN.   THN Long Term Goal Start Date  05/03/20  Interventions for Problem One Long Term Goal  Discussed with patient blood pressure control.       RN CM will contact patient again in the month of August and patient agreeable.    Jone Baseman, RN, MSN [place] Management Care Management Coordinator Direct Line [phone] Cell [phone] Toll Free: [phone]  Fax: [phone]

## 2020-05-05 DIAGNOSIS — R110 Nausea: Secondary | ICD-10-CM | POA: Diagnosis not present

## 2020-05-05 DIAGNOSIS — R10813 Right lower quadrant abdominal tenderness: Secondary | ICD-10-CM | POA: Diagnosis not present

## 2020-05-08 ENCOUNTER — Other Ambulatory Visit: Payer: Self-pay

## 2020-05-08 ENCOUNTER — Emergency Department (HOSPITAL_COMMUNITY)
Admission: EM | Admit: 2020-05-08 | Discharge: 2020-05-08 | Disposition: A | Discharge status: Home / Self Care | Payer: Medicare HMO | Attending: Emergency Medicine | Admitting: Emergency Medicine

## 2020-05-08 ENCOUNTER — Encounter (HOSPITAL_COMMUNITY): Payer: Self-pay | Admitting: *Deleted

## 2020-05-08 ENCOUNTER — Emergency Department (HOSPITAL_COMMUNITY): Payer: Medicare HMO

## 2020-05-08 DIAGNOSIS — Z7982 Long term (current) use of aspirin: Secondary | ICD-10-CM | POA: Diagnosis not present

## 2020-05-08 DIAGNOSIS — Z79899 Other long term (current) drug therapy: Secondary | ICD-10-CM | POA: Diagnosis not present

## 2020-05-08 DIAGNOSIS — R109 Unspecified abdominal pain: Secondary | ICD-10-CM | POA: Diagnosis not present

## 2020-05-08 DIAGNOSIS — R197 Diarrhea, unspecified: Secondary | ICD-10-CM | POA: Diagnosis not present

## 2020-05-08 DIAGNOSIS — K529 Noninfective gastroenteritis and colitis, unspecified: Secondary | ICD-10-CM | POA: Diagnosis not present

## 2020-05-08 DIAGNOSIS — R110 Nausea: Secondary | ICD-10-CM

## 2020-05-08 DIAGNOSIS — E039 Hypothyroidism, unspecified: Secondary | ICD-10-CM | POA: Diagnosis not present

## 2020-05-08 DIAGNOSIS — J45909 Unspecified asthma, uncomplicated: Secondary | ICD-10-CM | POA: Diagnosis not present

## 2020-05-08 DIAGNOSIS — R1030 Lower abdominal pain, unspecified: Secondary | ICD-10-CM | POA: Insufficient documentation

## 2020-05-08 DIAGNOSIS — Z87891 Personal history of nicotine dependence: Secondary | ICD-10-CM | POA: Diagnosis not present

## 2020-05-08 DIAGNOSIS — R1084 Generalized abdominal pain: Secondary | ICD-10-CM | POA: Diagnosis present

## 2020-05-08 LAB — CBC WITH DIFFERENTIAL/PLATELET
Abs Immature Granulocytes: 0.07 10*3/uL (ref 0.00–0.07)
Basophils Absolute: 0 10*3/uL (ref 0.0–0.1)
Basophils Relative: 0 %
Eosinophils Absolute: 0 10*3/uL (ref 0.0–0.5)
Eosinophils Relative: 0 %
HCT: 40 % (ref 36.0–46.0)
Hemoglobin: 12.6 g/dL (ref 12.0–15.0)
Immature Granulocytes: 1 %
Lymphocytes Relative: 17 %
Lymphs Abs: 2.3 10*3/uL (ref 0.7–4.0)
MCH: 29.9 pg (ref 26.0–34.0)
MCHC: 31.5 g/dL (ref 30.0–36.0)
MCV: 95 fL (ref 80.0–100.0)
Monocytes Absolute: 0.7 10*3/uL (ref 0.1–1.0)
Monocytes Relative: 5 %
Neutro Abs: 9.9 10*3/uL — ABNORMAL HIGH (ref 1.7–7.7)
Neutrophils Relative %: 77 %
Platelets: 219 10*3/uL (ref 150–400)
RBC: 4.21 MIL/uL (ref 3.87–5.11)
RDW: 15.3 % (ref 11.5–15.5)
WBC: 13 10*3/uL — ABNORMAL HIGH (ref 4.0–10.5)
nRBC: 0 % (ref 0.0–0.2)

## 2020-05-08 LAB — COMPREHENSIVE METABOLIC PANEL
ALT: 29 U/L (ref 0–44)
AST: 29 U/L (ref 15–41)
Albumin: 4.4 g/dL (ref 3.5–5.0)
Alkaline Phosphatase: 62 U/L (ref 38–126)
Anion gap: 9 (ref 5–15)
BUN: 12 mg/dL (ref 8–23)
CO2: 26 mmol/L (ref 22–32)
Calcium: 9 mg/dL (ref 8.9–10.3)
Chloride: 100 mmol/L (ref 98–111)
Creatinine, Ser: 0.8 mg/dL (ref 0.44–1.00)
GFR calc Af Amer: >60 mL/min (ref >60)
GFR calc non Af Amer: >60 mL/min (ref >60)
Glucose, Bld: 101 mg/dL — ABNORMAL HIGH (ref 70–99)
Potassium: 3.8 mmol/L (ref 3.5–5.1)
Sodium: 135 mmol/L (ref 135–145)
Total Bilirubin: 0.5 mg/dL (ref 0.3–1.2)
Total Protein: 7.4 g/dL (ref 6.5–8.1)

## 2020-05-08 LAB — URINALYSIS, ROUTINE W REFLEX MICROSCOPIC
Bilirubin Urine: NEGATIVE
Glucose, UA: NEGATIVE mg/dL
Hgb urine dipstick: NEGATIVE
Ketones, ur: NEGATIVE mg/dL
Leukocytes,Ua: NEGATIVE
Nitrite: NEGATIVE
Protein, ur: NEGATIVE mg/dL
Specific Gravity, Urine: 1.004 — ABNORMAL LOW (ref 1.005–1.030)
pH: 7 (ref 5.0–8.0)

## 2020-05-08 LAB — LIPASE, BLOOD: Lipase: 21 U/L (ref 11–51)

## 2020-05-08 MED ORDER — IOHEXOL 300 MG/ML  SOLN
100.0000 mL | Freq: Once | INTRAMUSCULAR | Status: AC | PRN
  Administered 2020-05-08: 100 mL via INTRAVENOUS

## 2020-05-08 NOTE — ED Triage Notes (Signed)
Pt with nausea since Thursday, denies emesis.  Stools are loose than normal.  Has seen Dr. Juel Burrow office on Tuesday.  ? Colitis per pt and cipro and nausea medication Zofran, without relief.

## 2020-05-08 NOTE — ED Provider Notes (Signed)
[facility] EMERGENCY DEPARTMENT Provider Note   CSN: [id number] Arrival date & time: 05/08/20  1043     History Chief Complaint  Patient presents with   Nausea    [name] is a 79 y.o. female.  Pt complains of lower abdominal pain.  Pt has been taking cipro for colitis.  Pt reports she is concerned that cipro is interacting with her the mineral/electolyte drinks she has been drinking.  Pt complains of nausea.  Pt reports stool is still loose.   The history is provided by the patient. No language interpreter was used.  Abdominal Pain Pain location:  Generalized Pain quality: aching   Pain radiates to:  Does not radiate Pain severity:  Moderate Onset quality:  Gradual Timing:  Constant Progression:  Worsening Chronicity:  New Relieved by:  Nothing Worsened by:  Nothing Ineffective treatments:  None tried Associated symptoms: diarrhea and nausea   Associated symptoms: no anorexia        Past Medical History:  Diagnosis Date   Anemia    Anxiety    Arthritis    Asthma    Constipation    GERD (gastroesophageal reflux disease)    High blood pressure    Hypothyroidism    Seasonal allergies    Thyroid disease    Varicose veins    Vertigo 11/23/2013    Patient Active Problem List   Diagnosis Date Noted   Tick bite 04/10/2020   Medication refill 04/10/2020   Acute maxillary sinusitis 08/20/2019   Cellulitis, leg 08/10/2019   Cellulitis 08/08/2019   Fever 08/08/2019   Cellulitis of foot 08/08/2019   Hypokalemia 08/08/2019   Hyponatremia 08/08/2019   Essential hypertension 08/08/2019   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   Adverse food reaction 09/24/2018   Mild persistent asthma without complication 65/46/5035   Pain of left hip joint 07/17/2018   Dyspnea on exertion 01/24/2017   Cough variant asthma  vs UACS 01/24/2017   High risk medication use 09/25/2016   Varicose veins of lower extremities with other  complications 46/56/8127   Varicose veins 02/15/2014   Vertigo 11/23/2013   Pain in limb 10/04/2013   Hip bursitis 02/11/2013   Constipation 02/16/2009   RECTAL BLEEDING 02/16/2009   HIGH BLOOD PRESSURE 01/29/2008    Past Surgical History:  Procedure Laterality Date   APPENDECTOMY     CATARACT EXTRACTION W/PHACO Left 01/27/2017   Procedure: CATARACT EXTRACTION PHACO AND INTRAOCULAR LENS PLACEMENT ([place]);  Surgeon: Tonny Branch, MD;  Location: [facility];  Service: Ophthalmology;  Laterality: Left;  CDE: 29.90   CATARACT EXTRACTION W/PHACO Right 02/24/2017   Procedure: CATARACT EXTRACTION PHACO AND INTRAOCULAR LENS PLACEMENT ([facility]);  Surgeon: Tonny Branch, MD;  Location: [facility];  Service: Ophthalmology;  Laterality: Right;  CDE: 8.57   CHOLECYSTECTOMY     COLONOSCOPY N/A 10/01/2016   Procedure: COLONOSCOPY;  Surgeon: Daneil Dolin, MD;  Location: [facility];  Service: Endoscopy;  Laterality: N/A;  [place] Right 11/06/2017   Procedure: RIGHT WRIST FLEXOR TENDON REPAIRAND STT DEBRIEDMENT;  Surgeon: Leanora Cover, MD;  Location: [place];  Service: Orthopedics;  Laterality: Right;   TUBAL LIGATION       OB History    Gravida  2   Para  2   Term      Preterm      AB      Living  2     SAB      TAB  Ectopic      Multiple      Live Births  2           Family History  Problem Relation Age of Onset   Hypertension Mother    Varicose Veins Father    Heart attack Father    Heart disease Father    Hypertension Brother    COPD Brother     Social History   Tobacco Use   Smoking status: Former Smoker    Packs/day: 0.25    Years: 5.00    Pack years: 1.25    Types: Cigarettes    Quit date: 12/02/1997    Years since quitting: 22.4   Smokeless tobacco: Never Used  Substance Use Topics   Alcohol use: No    Alcohol/week: 1.0 standard drinks    Types: 1 Glasses of wine per week   Drug use: No    Home  Medications Prior to Admission medications   Medication Sig Start Date End Date Taking? Authorizing Provider  albuterol (VENTOLIN HFA) 108 (90 Base) MCG/ACT inhaler Inhale 2 puffs into the lungs every 6 (six) hours as needed for wheezing or shortness of breath.    [provider]  ALPRAZolam Duanne Moron) 0.5 MG tablet Take 0.5 mg by mouth at bedtime.     [provider]  amLODipine (NORVASC) 2.5 MG tablet Take 2.5 mg by mouth every evening.  02/09/13   [provider]  aspirin EC 81 MG tablet Take 81 mg by mouth daily.    [provider]  baclofen (LIORESAL) 10 MG tablet Take 5 mg by mouth 3 (three) times daily as needed. 04/11/20   [provider]  EPINEPHrine 0.3 mg/0.3 mL IJ SOAJ injection Inject 0.3 mLs (0.3 mg total) into the muscle once as needed for anaphylaxis. 12/30/18   Valentina Shaggy, MD  fluticasone [facility]) 50 MCG/ACT nasal spray Place 2 sprays into both nostrils as needed.     [name], MD  levothyroxine (SYNTHROID) 75 MCG tablet  08/12/19   [provider]  losartan (COZAAR) 50 MG tablet Take 25 mg by mouth daily.     [provider]  MAGNESIUM PO Take 500 mg by mouth daily.    [provider]  Multiple Vitamin (MULTIVITAMIN) tablet Take 1 tablet by mouth daily.    [provider]  pantoprazole (PROTONIX) 40 MG tablet TAKE 1 TABLET BY MOUTH ONCE DAILY 30 TO 60 MINUTES BEFORE FIRST MEAL OF THE DAY. 07/29/17   Tanda Rockers, MD  raloxifene (EVISTA) 60 MG tablet Take 1 tablet (60 mg total) by mouth every morning. 04/10/20   Estill Dooms, NP  SYMBICORT 80-4.5 MCG/ACT inhaler INHALE 2 PUFFS INTO THE LUNGS TWICE DAILY. Patient taking differently: 2 puffs. QD am 02/02/18   Tanda Rockers, MD  triamterene-hydrochlorothiazide (MAXZIDE-25) 37.5-25 MG tablet Take 1 tablet by mouth daily. 08/02/19   [provider]    Allergies    Amoxicillin-pot clavulanate, Ace inhibitors, Entex la,  Levofloxacin, and Meloxicam  Review of Systems   Review of Systems  Gastrointestinal: Positive for abdominal pain, diarrhea and nausea. Negative for anorexia.  All other systems reviewed and are negative.   Physical Exam Updated Vital Signs BP (!) 174/88 (BP Location: Left Arm)    Pulse 72    Temp 98 F (36.7 C) (Oral)    Resp 16    Ht 5\' 4"  (1.626 m)    Wt 68.5 kg    SpO2 98%  BMI 25.92 kg/m   Physical Exam Vitals reviewed.  Constitutional:      Appearance: Normal appearance.  HENT:     Head: Normocephalic.     Right Ear: Tympanic membrane normal.     Left Ear: Tympanic membrane normal.     Mouth/Throat:     Mouth: Mucous membranes are moist.  Eyes:     Pupils: Pupils are equal, round, and reactive to light.  Cardiovascular:     Rate and Rhythm: Normal rate and regular rhythm.  Pulmonary:     Effort: Pulmonary effort is normal.  Abdominal:     General: Abdomen is flat.     Comments: Tender lower abdomen diffusely   Musculoskeletal:        General: Normal range of motion.     Cervical back: Normal range of motion.  Skin:    General: Skin is warm.  Neurological:     General: No focal deficit present.     Mental Status: She is alert.  Psychiatric:        Mood and Affect: Mood normal.     ED Results / Procedures / Treatments   Labs (all labs ordered are listed, but only abnormal results are displayed) Labs Reviewed  CBC WITH DIFFERENTIAL/PLATELET - Abnormal; Notable for the following components:      Result Value   WBC 13.0 (*)    Neutro Abs 9.9 (*)    All other components within normal limits  COMPREHENSIVE METABOLIC PANEL - Abnormal; Notable for the following components:   Glucose, Bld 101 (*)    All other components within normal limits  URINALYSIS, ROUTINE W REFLEX MICROSCOPIC - Abnormal; Notable for the following components:   Color, Urine STRAW (*)    Specific Gravity, Urine 1.004 (*)    All other components within normal limits  LIPASE, BLOOD     EKG None  Radiology CT ABDOMEN PELVIS W CONTRAST  Result Date: 05/08/2020 CLINICAL DATA:  Nausea, abdominal pain EXAM: CT ABDOMEN AND PELVIS WITH CONTRAST TECHNIQUE: Multidetector CT imaging of the abdomen and pelvis was performed using the standard protocol following bolus administration of intravenous contrast. CONTRAST:  124mL OMNIPAQUE IOHEXOL 300 MG/ML  SOLN COMPARISON:  None. FINDINGS: Lower chest: No acute pleural or parenchymal lung disease. Hepatobiliary: No focal liver abnormality is seen. Status post cholecystectomy. No biliary dilatation. Pancreas: Unremarkable. No pancreatic ductal dilatation or surrounding inflammatory changes. Spleen: Normal in size without focal abnormality. Adrenals/Urinary Tract: Adrenal glands are unremarkable. Kidneys are normal, without renal calculi, focal lesion, or hydronephrosis. Bladder is unremarkable. Stomach/Bowel: No bowel obstruction or ileus. No bowel wall thickening or inflammatory changes. Scattered diverticulosis of the sigmoid colon without diverticulitis. Vascular/Lymphatic: Aortic atherosclerosis. No enlarged abdominal or pelvic lymph nodes. Reproductive: Uterus and bilateral adnexa are unremarkable. Other: No abdominal wall hernia or abnormality. No abdominopelvic ascites. Musculoskeletal: No acute or destructive bony lesions. Reconstructed images demonstrate no additional findings. IMPRESSION: 1. No acute intra-abdominal or intrapelvic process. 2. Sigmoid diverticulosis without diverticulitis. 3. Aortic Atherosclerosis (ICD10-I70.0). Electronically Signed   By: Randa Ngo M.D.   On: 05/08/2020 16:12    Procedures Procedures (including critical care time)  Medications Ordered in ED Medications  iohexol (OMNIPAQUE) 300 MG/ML solution 100 mL (100 mLs Intravenous Contrast Given 05/08/20 1534)    ED Course  I have reviewed the triage vital signs and the nursing notes.  Pertinent labs & imaging results that were available during my care of  the patient were reviewed by me and considered in  my medical decision making (see chart for details).    MDM Rules/Calculators/A&P                      MDM:  Ct scan shows diverticulosis  No diverticulitis.  Pt advised to follow up with her MD for recheck.  Drink plenty of fluids, finish antibiotic  Final Clinical Impression(s) / ED Diagnoses Final diagnoses:  Nausea  Colitis    Rx / DC Orders ED Discharge Orders    None    An After Visit Summary was printed and given to the patient.    Fransico Meadow, PA-C 05/08/20 Johnnye Lana    Fredia Sorrow, MD 06/01/20 [phone]

## 2020-05-08 NOTE — Discharge Instructions (Addendum)
Follow up with your Physician.  Finish antibiotic as prescribed.

## 2020-05-09 DIAGNOSIS — R293 Abnormal posture: Secondary | ICD-10-CM | POA: Diagnosis not present

## 2020-05-09 DIAGNOSIS — M2569 Stiffness of other specified joint, not elsewhere classified: Secondary | ICD-10-CM | POA: Diagnosis not present

## 2020-05-09 DIAGNOSIS — M47892 Other spondylosis, cervical region: Secondary | ICD-10-CM | POA: Diagnosis not present

## 2020-05-09 DIAGNOSIS — M2559 Pain in other specified joint: Secondary | ICD-10-CM | POA: Diagnosis not present

## 2020-05-11 DIAGNOSIS — M2559 Pain in other specified joint: Secondary | ICD-10-CM | POA: Diagnosis not present

## 2020-05-11 DIAGNOSIS — M2569 Stiffness of other specified joint, not elsewhere classified: Secondary | ICD-10-CM | POA: Diagnosis not present

## 2020-05-11 DIAGNOSIS — R293 Abnormal posture: Secondary | ICD-10-CM | POA: Diagnosis not present

## 2020-05-11 DIAGNOSIS — M47892 Other spondylosis, cervical region: Secondary | ICD-10-CM | POA: Diagnosis not present

## 2020-05-16 DIAGNOSIS — R293 Abnormal posture: Secondary | ICD-10-CM | POA: Diagnosis not present

## 2020-05-16 DIAGNOSIS — M47892 Other spondylosis, cervical region: Secondary | ICD-10-CM | POA: Diagnosis not present

## 2020-05-16 DIAGNOSIS — M2559 Pain in other specified joint: Secondary | ICD-10-CM | POA: Diagnosis not present

## 2020-05-16 DIAGNOSIS — M2569 Stiffness of other specified joint, not elsewhere classified: Secondary | ICD-10-CM | POA: Diagnosis not present

## 2020-05-17 DIAGNOSIS — E782 Mixed hyperlipidemia: Secondary | ICD-10-CM | POA: Diagnosis not present

## 2020-05-17 DIAGNOSIS — E119 Type 2 diabetes mellitus without complications: Secondary | ICD-10-CM | POA: Diagnosis not present

## 2020-05-17 DIAGNOSIS — B373 Candidiasis of vulva and vagina: Secondary | ICD-10-CM | POA: Diagnosis not present

## 2020-05-17 DIAGNOSIS — E1169 Type 2 diabetes mellitus with other specified complication: Secondary | ICD-10-CM | POA: Diagnosis not present

## 2020-05-17 DIAGNOSIS — D72829 Elevated white blood cell count, unspecified: Secondary | ICD-10-CM | POA: Diagnosis not present

## 2020-05-17 DIAGNOSIS — E039 Hypothyroidism, unspecified: Secondary | ICD-10-CM | POA: Diagnosis not present

## 2020-05-17 DIAGNOSIS — Z0001 Encounter for general adult medical examination with abnormal findings: Secondary | ICD-10-CM | POA: Diagnosis not present

## 2020-05-17 DIAGNOSIS — Z0000 Encounter for general adult medical examination without abnormal findings: Secondary | ICD-10-CM | POA: Diagnosis not present

## 2020-05-17 DIAGNOSIS — D509 Iron deficiency anemia, unspecified: Secondary | ICD-10-CM | POA: Diagnosis not present

## 2020-05-18 ENCOUNTER — Ambulatory Visit: Payer: Self-pay

## 2020-05-18 DIAGNOSIS — R293 Abnormal posture: Secondary | ICD-10-CM | POA: Diagnosis not present

## 2020-05-18 DIAGNOSIS — M2559 Pain in other specified joint: Secondary | ICD-10-CM | POA: Diagnosis not present

## 2020-05-18 DIAGNOSIS — M2569 Stiffness of other specified joint, not elsewhere classified: Secondary | ICD-10-CM | POA: Diagnosis not present

## 2020-05-18 DIAGNOSIS — M47892 Other spondylosis, cervical region: Secondary | ICD-10-CM | POA: Diagnosis not present

## 2020-05-20 ENCOUNTER — Ambulatory Visit
Admission: RE | Admit: 2020-05-20 | Discharge: 2020-05-20 | Disposition: A | Discharge status: Home / Self Care | Payer: Medicare HMO | Source: Ambulatory Visit | Attending: Orthopaedic Surgery | Admitting: Orthopaedic Surgery

## 2020-05-20 ENCOUNTER — Other Ambulatory Visit: Payer: Self-pay

## 2020-05-20 DIAGNOSIS — M4802 Spinal stenosis, cervical region: Secondary | ICD-10-CM | POA: Diagnosis not present

## 2020-05-20 DIAGNOSIS — M4312 Spondylolisthesis, cervical region: Secondary | ICD-10-CM

## 2020-05-23 DIAGNOSIS — M2559 Pain in other specified joint: Secondary | ICD-10-CM | POA: Diagnosis not present

## 2020-05-23 DIAGNOSIS — R293 Abnormal posture: Secondary | ICD-10-CM | POA: Diagnosis not present

## 2020-05-23 DIAGNOSIS — M47892 Other spondylosis, cervical region: Secondary | ICD-10-CM | POA: Diagnosis not present

## 2020-05-23 DIAGNOSIS — M2569 Stiffness of other specified joint, not elsewhere classified: Secondary | ICD-10-CM | POA: Diagnosis not present

## 2020-05-24 DIAGNOSIS — E119 Type 2 diabetes mellitus without complications: Secondary | ICD-10-CM | POA: Diagnosis not present

## 2020-05-24 DIAGNOSIS — E039 Hypothyroidism, unspecified: Secondary | ICD-10-CM | POA: Diagnosis not present

## 2020-05-24 DIAGNOSIS — D509 Iron deficiency anemia, unspecified: Secondary | ICD-10-CM | POA: Diagnosis not present

## 2020-05-24 DIAGNOSIS — Z0000 Encounter for general adult medical examination without abnormal findings: Secondary | ICD-10-CM | POA: Diagnosis not present

## 2020-05-24 DIAGNOSIS — Z0001 Encounter for general adult medical examination with abnormal findings: Secondary | ICD-10-CM | POA: Diagnosis not present

## 2020-05-24 DIAGNOSIS — B373 Candidiasis of vulva and vagina: Secondary | ICD-10-CM | POA: Diagnosis not present

## 2020-05-24 DIAGNOSIS — D72829 Elevated white blood cell count, unspecified: Secondary | ICD-10-CM | POA: Diagnosis not present

## 2020-05-24 DIAGNOSIS — E782 Mixed hyperlipidemia: Secondary | ICD-10-CM | POA: Diagnosis not present

## 2020-05-24 DIAGNOSIS — E1169 Type 2 diabetes mellitus with other specified complication: Secondary | ICD-10-CM | POA: Diagnosis not present

## 2020-05-25 DIAGNOSIS — Z6825 Body mass index (BMI) 25.0-25.9, adult: Secondary | ICD-10-CM | POA: Diagnosis not present

## 2020-05-25 DIAGNOSIS — M47892 Other spondylosis, cervical region: Secondary | ICD-10-CM | POA: Diagnosis not present

## 2020-05-25 DIAGNOSIS — M7918 Myalgia, other site: Secondary | ICD-10-CM | POA: Diagnosis not present

## 2020-05-25 DIAGNOSIS — M4312 Spondylolisthesis, cervical region: Secondary | ICD-10-CM | POA: Diagnosis not present

## 2020-05-26 DIAGNOSIS — M2569 Stiffness of other specified joint, not elsewhere classified: Secondary | ICD-10-CM | POA: Diagnosis not present

## 2020-05-26 DIAGNOSIS — M2559 Pain in other specified joint: Secondary | ICD-10-CM | POA: Diagnosis not present

## 2020-05-26 DIAGNOSIS — R293 Abnormal posture: Secondary | ICD-10-CM | POA: Diagnosis not present

## 2020-05-26 DIAGNOSIS — M47892 Other spondylosis, cervical region: Secondary | ICD-10-CM | POA: Diagnosis not present

## 2020-05-29 DIAGNOSIS — F411 Generalized anxiety disorder: Secondary | ICD-10-CM | POA: Diagnosis not present

## 2020-05-29 DIAGNOSIS — R7301 Impaired fasting glucose: Secondary | ICD-10-CM | POA: Diagnosis not present

## 2020-05-29 DIAGNOSIS — E876 Hypokalemia: Secondary | ICD-10-CM | POA: Diagnosis not present

## 2020-05-29 DIAGNOSIS — J441 Chronic obstructive pulmonary disease with (acute) exacerbation: Secondary | ICD-10-CM | POA: Diagnosis not present

## 2020-05-29 DIAGNOSIS — R1084 Generalized abdominal pain: Secondary | ICD-10-CM | POA: Diagnosis not present

## 2020-05-29 DIAGNOSIS — R110 Nausea: Secondary | ICD-10-CM | POA: Diagnosis not present

## 2020-05-29 DIAGNOSIS — L209 Atopic dermatitis, unspecified: Secondary | ICD-10-CM | POA: Diagnosis not present

## 2020-05-29 DIAGNOSIS — Z6829 Body mass index (BMI) 29.0-29.9, adult: Secondary | ICD-10-CM | POA: Diagnosis not present

## 2020-05-29 DIAGNOSIS — B373 Candidiasis of vulva and vagina: Secondary | ICD-10-CM | POA: Diagnosis not present

## 2020-05-29 DIAGNOSIS — R10813 Right lower quadrant abdominal tenderness: Secondary | ICD-10-CM | POA: Diagnosis not present

## 2020-05-29 DIAGNOSIS — M79606 Pain in leg, unspecified: Secondary | ICD-10-CM | POA: Diagnosis not present

## 2020-05-29 DIAGNOSIS — G629 Polyneuropathy, unspecified: Secondary | ICD-10-CM | POA: Diagnosis not present

## 2020-05-30 DIAGNOSIS — M2559 Pain in other specified joint: Secondary | ICD-10-CM | POA: Diagnosis not present

## 2020-05-30 DIAGNOSIS — R293 Abnormal posture: Secondary | ICD-10-CM | POA: Diagnosis not present

## 2020-05-30 DIAGNOSIS — M2569 Stiffness of other specified joint, not elsewhere classified: Secondary | ICD-10-CM | POA: Diagnosis not present

## 2020-05-30 DIAGNOSIS — M47892 Other spondylosis, cervical region: Secondary | ICD-10-CM | POA: Diagnosis not present

## 2020-06-01 ENCOUNTER — Encounter: Payer: Self-pay | Admitting: Internal Medicine

## 2020-06-02 DIAGNOSIS — M47892 Other spondylosis, cervical region: Secondary | ICD-10-CM | POA: Diagnosis not present

## 2020-06-02 DIAGNOSIS — M2559 Pain in other specified joint: Secondary | ICD-10-CM | POA: Diagnosis not present

## 2020-06-02 DIAGNOSIS — R293 Abnormal posture: Secondary | ICD-10-CM | POA: Diagnosis not present

## 2020-06-02 DIAGNOSIS — M2569 Stiffness of other specified joint, not elsewhere classified: Secondary | ICD-10-CM | POA: Diagnosis not present

## 2020-06-05 DIAGNOSIS — M2569 Stiffness of other specified joint, not elsewhere classified: Secondary | ICD-10-CM | POA: Diagnosis not present

## 2020-06-05 DIAGNOSIS — M2559 Pain in other specified joint: Secondary | ICD-10-CM | POA: Diagnosis not present

## 2020-06-05 DIAGNOSIS — R293 Abnormal posture: Secondary | ICD-10-CM | POA: Diagnosis not present

## 2020-06-05 DIAGNOSIS — M47892 Other spondylosis, cervical region: Secondary | ICD-10-CM | POA: Diagnosis not present

## 2020-06-07 ENCOUNTER — Telehealth: Payer: Self-pay | Admitting: Emergency Medicine

## 2020-06-07 NOTE — Telephone Encounter (Signed)
pts daughter called to verify pts appt for 7/29 and requested her mother be placed on our cancellation list. For an earlier appt

## 2020-06-08 DIAGNOSIS — M2569 Stiffness of other specified joint, not elsewhere classified: Secondary | ICD-10-CM | POA: Diagnosis not present

## 2020-06-08 DIAGNOSIS — M2559 Pain in other specified joint: Secondary | ICD-10-CM | POA: Diagnosis not present

## 2020-06-08 DIAGNOSIS — R293 Abnormal posture: Secondary | ICD-10-CM | POA: Diagnosis not present

## 2020-06-08 DIAGNOSIS — M47892 Other spondylosis, cervical region: Secondary | ICD-10-CM | POA: Diagnosis not present

## 2020-06-08 NOTE — Telephone Encounter (Signed)
Placed patient on cancellation list.

## 2020-06-14 ENCOUNTER — Encounter: Payer: Self-pay | Admitting: Gastroenterology

## 2020-06-14 NOTE — Progress Notes (Signed)
Referring Provider: Dr. Allyn Kenner Primary Care Physician:  [name], MD Primary Gastroenterologist:  Dr. Gala Romney  Chief Complaint  Patient presents with  . Nausea    no vomiting; took Cipro for ?colitis-got better but got sick again  . Constipation    better after adding Miralax past few weeks; sometimes bm is "stringy"    HPI:   Sylvia RODRIGES is a 79 y.o. female presenting today at the request of at the request of Dr. Nevada Crane for nausea and abdominal pain.  We last saw patient in 2017 to schedule screening colonoscopy.  Noted history of chronic constipation at that time managed on prune juice and stool softener.  No other significant GI symptoms.  Colonoscopy in October 2017 with pancolonic diverticulosis, melanosis coli, otherwise normal exam.  No recommendations to repeat colonoscopy.  Patient was recently seen in the ED on 05/08/2020 for nausea and colitis.  She presented with complaints of lower abdominal pain, nausea, and loose stool.  Reported taking Cipro for colitis.  CT A/P with contrast revealed diverticulosis without diverticulitis, no other acute intra-abdominal or intrapelvic process.  CBC with slight elevation of WBC at 13.0, CMP essentially normal, lipase within normal limits, UA essentially normal.  She was advised to finish antibiotics and follow-up with provider who is treating her for colitis.  Reviewed PCP note dated 05/29/2020.  Patient reported ongoing nausea without vomiting, fatigue, no appetite, loose stools.  She was started on Carafate and labs update.  CBC remarkable for WBC slightly elevated at 10.9.  Lipase within normal limits.  H. pylori breath test negative.  Today: Developed nausea in late May/early June. Had TTP on right side of her abdomen when she saw PCP. She was stated on Cipro for possible colitis.Cipro worsened nausea symptoms but completed the antibiotics. After antibiotics, she felt better for about 1 week then nausea returned. No vomiting. No  abdominal pain. Nausea starts after eating. Doesn't wake up with nausea. Taking Zofran. Carafate added by PCP on 6/28 and hasn't noticed any change. Following a bland diet.   Started taking Zofran in the morning the last 2 days at which has really helped.  She would take Zofran when she first gets up and again 6 hours later and does not develop any nausea during the day.  States now she is just nervous about eating because she does not want to get nauseated.  No nausea when laying down at night. Having some increased indigestion. Brought some acid up in her mouth yesterday. Changed Protonix to nightly on 6/28 when she started Carafate.  Has been on Protonix for a while.  No dysphagia. Had been taking ibuprofen for neck pain prior to all of this starting for about 1 month.  She was taking Two ibuprofen every 6-8 hours.  Stopped ibuprofen in June when nausea started. No early satiety.  Patient reports completing H. pylori breath test with PCP recently and this was negative. Did not hold Protonix.  Has lost about 8-10 lbs since all this started.  No fever or chills. No sick contacts.   Constipation: Chronically takes OTC stool softeners.  Added MiraLAX the last couple of weeks which has helped with mild breakthrough constipation.  Typically having 1 soft formed BM daily.  Stools may occasionally get loose/"stringy". No bood in the stool or black stool.   Has a lot of stress. Husband with Alzheimer and she is the primary care giver.  Taking Xanax.   Past Medical History:  Diagnosis Date  .  Anemia   . Anxiety   . Arthritis   . Asthma   . Constipation   . GERD (gastroesophageal reflux disease)   . High blood pressure   . Hypothyroidism   . Seasonal allergies   . Thyroid disease   . Varicose veins   . Vertigo 11/23/2013    Past Surgical History:  Procedure Laterality Date  . APPENDECTOMY    . CATARACT EXTRACTION W/PHACO Left 01/27/2017   Procedure: CATARACT EXTRACTION PHACO AND INTRAOCULAR  LENS PLACEMENT ([facility]);  Surgeon: Tonny Branch, MD;  Location: [facility];  Service: Ophthalmology;  Laterality: Left;  CDE: 29.90  . CATARACT EXTRACTION W/PHACO Right 02/24/2017   Procedure: CATARACT EXTRACTION PHACO AND INTRAOCULAR LENS PLACEMENT ([facility]);  Surgeon: Tonny Branch, MD;  Location: [facility];  Service: Ophthalmology;  Laterality: Right;  CDE: 8.57  . CHOLECYSTECTOMY    . COLONOSCOPY N/A 10/01/2016   Procedure: COLONOSCOPY;  Surgeon: Daneil Dolin, MD; pancolonic diverticulosis, melanosis coli, otherwise normal exam.  No recommendations to repeat colonoscopy.  Marland Kitchen FLEXOR TENDON REPAIR Right 11/06/2017   Procedure: RIGHT WRIST FLEXOR TENDON REPAIRAND STT [place];  Surgeon: Leanora Cover, MD;  Location: [place];  Service: Orthopedics;  Laterality: Right;  . TUBAL LIGATION      Current Outpatient Medications  Medication Sig Dispense Refill  . albuterol (VENTOLIN HFA) 108 (90 Base) MCG/ACT inhaler Inhale 2 puffs into the lungs every 6 (six) hours as needed for wheezing or shortness of breath.    . ALPRAZolam (XANAX) 0.5 MG tablet Take 0.5 mg by mouth 3 (three) times daily as needed for anxiety.     Marland Kitchen amLODipine (NORVASC) 2.5 MG tablet Take 2.5 mg by mouth every evening.     Marland Kitchen aspirin EC 81 MG tablet Take 81 mg by mouth daily.    Mariane Baumgarten Sodium (STOOL SOFTENER LAXATIVE PO) Take by mouth daily. Takes 2 daily    . EPINEPHrine 0.3 mg/0.3 mL IJ SOAJ injection Inject 0.3 mLs (0.3 mg total) into the muscle once as needed for anaphylaxis. 2 Device 1  . fluticasone (FLONASE) 50 MCG/ACT nasal spray Place 2 sprays into both nostrils as needed.     Marland Kitchen levothyroxine (SYNTHROID) 75 MCG tablet Take 75 mcg by mouth daily.     Marland Kitchen losartan (COZAAR) 50 MG tablet Take 25 mg by mouth daily.     Marland Kitchen MAGNESIUM PO Take 500 mg by mouth daily.    . Multiple Vitamin (MULTIVITAMIN) tablet Take 1 tablet by mouth daily.    . ondansetron (ZOFRAN) 4 MG tablet Take 4 mg by mouth every 6 (six) hours as needed.     . polyethylene glycol (MIRALAX / GLYCOLAX) 17 g packet Take 17 g by mouth daily.    . raloxifene (EVISTA) 60 MG tablet Take 1 tablet (60 mg total) by mouth every morning. (Patient taking differently: Take 60 mg by mouth every other day. ) 30 tablet 12  . sucralfate (CARAFATE) 1 g tablet Take 1 g by mouth 3 (three) times daily.    . SYMBICORT 80-4.5 MCG/ACT inhaler INHALE 2 PUFFS INTO THE LUNGS TWICE DAILY. (Patient taking differently: Inhale 2 puffs into the lungs daily. QD am) 10.2 g 0  . triamterene-hydrochlorothiazide (MAXZIDE-25) 37.5-25 MG tablet Take 1 tablet by mouth as needed.     . pantoprazole (PROTONIX) 40 MG tablet Take 1 tablet (40 mg total) by mouth 2 (two) times daily before a meal. 60 tablet 2   No current facility-administered medications for this visit.  Allergies as of 06/15/2020 - Review Complete 06/15/2020  Allergen Reaction Noted  . Amoxicillin-pot clavulanate Nausea And Vomiting, Other (See Comments), Itching, and Nausea Only 10/04/2013  . Ace inhibitors Cough   . Entex la Itching   . Levofloxacin Hives and Itching 01/21/2017  . Meloxicam  04/25/2020    Family History  Problem Relation Age of Onset  . Hypertension Mother   . Varicose Veins Father   . Heart attack Father   . Heart disease Father   . Hypertension Brother   . COPD Brother     Social History   Socioeconomic History  . Marital status: Married    Spouse name: Not on file  . Number of children: Not on file  . Years of education: Not on file  . Highest education level: Not on file  Occupational History  . Not on file  Tobacco Use  . Smoking status: Former Smoker    Packs/day: 0.25    Years: 5.00    Pack years: 1.25    Types: Cigarettes    Quit date: 12/02/1997    Years since quitting: 22.5  . Smokeless tobacco: Never Used  Vaping Use  . Vaping Use: Never used  Substance and Sexual Activity  . Alcohol use: Yes    Alcohol/week: 1.0 standard drink    Types: 1 Glasses of wine per week     Comment: occasional wine  . Drug use: No  . Sexual activity: Yes    Birth control/protection: Surgical    Comment: tubal  Other Topics Concern  . Not on file  Social History Narrative  . Not on file   Social Determinants of Health   Financial Resource Strain: Low Risk   . Difficulty of Paying Living Expenses: Not hard at all  Food Insecurity: No Food Insecurity  . Worried About Charity fundraiser in the Last Year: Never true  . Ran Out of Food in the Last Year: Never true  Transportation Needs: No Transportation Needs  . Lack of Transportation (Medical): No  . Lack of Transportation (Non-Medical): No  Physical Activity: Inactive  . Days of Exercise per Week: 0 days  . Minutes of Exercise per Session: 0 min  Stress: Stress Concern Present  . Feeling of Stress : To some extent  Social Connections: Socially Integrated  . Frequency of Communication with Friends and Family: More than three times a week  . Frequency of Social Gatherings with Friends and Family: More than three times a week  . Attends Religious Services: More than 4 times per year  . Active Member of Clubs or Organizations: Yes  . Attends Archivist Meetings: More than 4 times per year  . Marital Status: Married  Human resources officer Violence: Not At Risk  . Fear of Current or Ex-Partner: No  . Emotionally Abused: No  . Physically Abused: No  . Sexually Abused: No    Review of Systems: Gen: Denies any fever, chills, cold or flulike symptoms, lightheadedness, dizziness, presyncope, syncope. CV: Denies chest pain or heart palpitations. Resp: Denies shortness of breath or cough. GI: See HPI MS: Neck pain is improving. Derm: Denies rash Psych: Admits to stress/anxiety Heme: See HPI  Physical Exam: BP (!) 153/89   Pulse 67   Temp 97.6 F (36.4 C) (Oral)   Ht 5\' 4"  (1.626 m)   Wt 143 lb 12.8 oz (65.2 kg)   BMI 24.68 kg/m  General:   Alert and oriented. Pleasant and cooperative. Well-nourished  and  well-developed.  Head:  Normocephalic and atraumatic. Eyes:  Without icterus, sclera clear and conjunctiva pink.  Ears:  Normal auditory acuity. Lungs:  Clear to auscultation bilaterally. No wheezes, rales, or rhonchi. No distress.  Heart:  S1, S2 present without murmurs appreciated.  Abdomen:  +BS, soft, and non-distended. Minimal TTP in epigastric area. No HSM noted. No guarding or rebound. No masses appreciated.  Rectal:  Deferred  Msk:  Symmetrical without gross deformities. Normal posture. Extremities:  Without edema. Neurologic:  Alert and  oriented x4;  grossly normal neurologically. Skin:  Intact without significant lesions or rashes. Psych: Normal mood and affect.

## 2020-06-15 ENCOUNTER — Encounter: Payer: Self-pay | Admitting: Gastroenterology

## 2020-06-15 ENCOUNTER — Ambulatory Visit: Payer: Medicare HMO | Admitting: Gastroenterology

## 2020-06-15 ENCOUNTER — Other Ambulatory Visit: Payer: Self-pay

## 2020-06-15 VITALS — BP 153/89 | HR 67 | Temp 97.6°F | Ht 64.0 in | Wt 143.8 lb

## 2020-06-15 DIAGNOSIS — R110 Nausea: Secondary | ICD-10-CM | POA: Diagnosis not present

## 2020-06-15 DIAGNOSIS — K5900 Constipation, unspecified: Secondary | ICD-10-CM | POA: Diagnosis not present

## 2020-06-15 MED ORDER — PANTOPRAZOLE SODIUM 40 MG PO TBEC: 40.0000 mg | DELAYED_RELEASE_TABLET | Freq: Two times a day (BID) | ORAL | 2 refills | Status: DC

## 2020-06-15 NOTE — Assessment & Plan Note (Addendum)
New onset of daily nausea without vomiting around late May/ early June with associated weight loss.  Reports waking up feeling well but nausea starts after eating.  Also with increased indigestion chronically on Protonix 40 mg daily. Denies abdominal pain, early satiety, brbpr, or melena. Admits to taking ibuprofen 400 mg q6-8 hours x1 month prior to onset of symptoms. Prior evaluation/managemet has included empiric treatment of possible colitis with ciprofloxacin by PCP.  Short-lived improvement for about 1 week after antibiotics. Evaluation in the ED 6/7 with CT A/P with contrast with no acute findings.  Labs with slight elevation of WBC at 13, otherwise CBC, CMP, lipase, UA unrevealing.  Updated labs with PCP 6/28 with WBC improved to 10.9, H. pylori breath test negative (patient did not hold Protonix), lipase normal.  PCP started Carafate 05/30/2019 without any significant improvement.  Since taking Zofran in the mornings for the last 2 days, this has helped prevent nausea throughout the day.  Differentials include uncontrolled GERD, NSAID induced gastritis, possible PUD, H. pylori, and cannot rule out malignancy.    Plan:  As she has increased indigestion, will start with increasing Protonix to 40 mg twice daily 30 minutes before breakfast and dinner.   Stop Carafate for now.   Follow low-fat diet.  Avoid fried, fatty, greasy foods.  Avoid spicy foods.  Avoid caffeine. Drink enough water to keep urine pale yellow to clear.  Avoid all NSAIDs. If no significant improvement with increasing Protonix or if weight does not stabilize/improve, would need to pursue EGD.   We will plan to follow-up in 2 months.  Advised patient to call me if no significant improvement in the next 4 weeks.

## 2020-06-15 NOTE — Assessment & Plan Note (Addendum)
Well controlled with stool softener and MiraLAX daily. No brbpr, melena, or abdominal pain.  Colonoscopy up-to-date in October 2017 with pancolonic diverticulosis, melanosis coli, otherwise normal exam.  No recommendations to repeat colonoscopy. Advise she continue stool softeners and MiraLAX daily.  Follow-up in 2 months for nausea.

## 2020-06-15 NOTE — Patient Instructions (Addendum)
Please increase Protonix to 40 mg twice daily 30 minutes before breakfast and dinner. I have sent a new prescription to your pharmacy.  Ideally, levothyroxine should be separated from Protonix by 3-4 hours.   You may stop Carafate for now.  Continue Zofran as needed for nausea.  Take this in the morning when you wake up to prevent nausea and and as needed throughout the day every 6 hours.  Follow a low-fat diet.  Avoid fried, fatty, greasy foods. Avoid spicy foods. Continue to limit caffeine for now.  You may resume drinking your decaffeinated tea as tolerated. Continue drinking plenty of water to keep urine pale yellow to clear.   Avoid all NSAID products including ibuprofen, Aleve, Advil, Goody powders, other aspirin powders, and anything that says "NSAID" on the package.  Continue MiraLAX 1 capful in 8 oz of water daily for constipation.  We will plan to see back in 2 months.  Call questions or concerns prior.   Aliene Altes, PA-C [facility]

## 2020-06-21 DIAGNOSIS — L209 Atopic dermatitis, unspecified: Secondary | ICD-10-CM | POA: Diagnosis not present

## 2020-06-21 DIAGNOSIS — R21 Rash and other nonspecific skin eruption: Secondary | ICD-10-CM | POA: Diagnosis not present

## 2020-06-21 DIAGNOSIS — F411 Generalized anxiety disorder: Secondary | ICD-10-CM | POA: Diagnosis not present

## 2020-06-21 DIAGNOSIS — M545 Low back pain: Secondary | ICD-10-CM | POA: Diagnosis not present

## 2020-06-21 DIAGNOSIS — R110 Nausea: Secondary | ICD-10-CM | POA: Diagnosis not present

## 2020-06-21 DIAGNOSIS — R197 Diarrhea, unspecified: Secondary | ICD-10-CM | POA: Diagnosis not present

## 2020-06-21 DIAGNOSIS — Z0000 Encounter for general adult medical examination without abnormal findings: Secondary | ICD-10-CM | POA: Diagnosis not present

## 2020-06-21 DIAGNOSIS — M79674 Pain in right toe(s): Secondary | ICD-10-CM | POA: Diagnosis not present

## 2020-06-21 DIAGNOSIS — G501 Atypical facial pain: Secondary | ICD-10-CM | POA: Diagnosis not present

## 2020-06-21 DIAGNOSIS — R1084 Generalized abdominal pain: Secondary | ICD-10-CM | POA: Diagnosis not present

## 2020-06-21 DIAGNOSIS — E039 Hypothyroidism, unspecified: Secondary | ICD-10-CM | POA: Diagnosis not present

## 2020-06-21 DIAGNOSIS — Z0001 Encounter for general adult medical examination with abnormal findings: Secondary | ICD-10-CM | POA: Diagnosis not present

## 2020-06-22 DIAGNOSIS — R293 Abnormal posture: Secondary | ICD-10-CM | POA: Diagnosis not present

## 2020-06-22 DIAGNOSIS — M47892 Other spondylosis, cervical region: Secondary | ICD-10-CM | POA: Diagnosis not present

## 2020-06-22 DIAGNOSIS — M799 Soft tissue disorder, unspecified: Secondary | ICD-10-CM | POA: Diagnosis not present

## 2020-06-22 DIAGNOSIS — M545 Low back pain: Secondary | ICD-10-CM | POA: Diagnosis not present

## 2020-06-22 DIAGNOSIS — M2569 Stiffness of other specified joint, not elsewhere classified: Secondary | ICD-10-CM | POA: Diagnosis not present

## 2020-06-22 DIAGNOSIS — M6281 Muscle weakness (generalized): Secondary | ICD-10-CM | POA: Diagnosis not present

## 2020-06-22 DIAGNOSIS — M2559 Pain in other specified joint: Secondary | ICD-10-CM | POA: Diagnosis not present

## 2020-06-27 DIAGNOSIS — M799 Soft tissue disorder, unspecified: Secondary | ICD-10-CM | POA: Diagnosis not present

## 2020-06-27 DIAGNOSIS — M2569 Stiffness of other specified joint, not elsewhere classified: Secondary | ICD-10-CM | POA: Diagnosis not present

## 2020-06-27 DIAGNOSIS — M545 Low back pain: Secondary | ICD-10-CM | POA: Diagnosis not present

## 2020-06-27 DIAGNOSIS — M6281 Muscle weakness (generalized): Secondary | ICD-10-CM | POA: Diagnosis not present

## 2020-06-28 DIAGNOSIS — M6281 Muscle weakness (generalized): Secondary | ICD-10-CM | POA: Diagnosis not present

## 2020-06-28 DIAGNOSIS — M2569 Stiffness of other specified joint, not elsewhere classified: Secondary | ICD-10-CM | POA: Diagnosis not present

## 2020-06-28 DIAGNOSIS — M545 Low back pain: Secondary | ICD-10-CM | POA: Diagnosis not present

## 2020-06-28 DIAGNOSIS — M799 Soft tissue disorder, unspecified: Secondary | ICD-10-CM | POA: Diagnosis not present

## 2020-06-29 ENCOUNTER — Ambulatory Visit: Payer: Medicare HMO | Admitting: Gastroenterology

## 2020-06-30 DIAGNOSIS — H43813 Vitreous degeneration, bilateral: Secondary | ICD-10-CM | POA: Diagnosis not present

## 2020-07-04 DIAGNOSIS — M545 Low back pain: Secondary | ICD-10-CM | POA: Diagnosis not present

## 2020-07-04 DIAGNOSIS — M799 Soft tissue disorder, unspecified: Secondary | ICD-10-CM | POA: Diagnosis not present

## 2020-07-04 DIAGNOSIS — M2569 Stiffness of other specified joint, not elsewhere classified: Secondary | ICD-10-CM | POA: Diagnosis not present

## 2020-07-04 DIAGNOSIS — M6281 Muscle weakness (generalized): Secondary | ICD-10-CM | POA: Diagnosis not present

## 2020-07-05 ENCOUNTER — Other Ambulatory Visit: Payer: Self-pay

## 2020-07-05 NOTE — Patient Outreach (Signed)
[facility]) Care Management  07/05/2020  SAVANNAHA STONEROCK [birth year] [id number]   Telephone call to patient for disease management follow up.  Female answered stating patient not in.  Patient has CM contact information.    Plan: RN CM will wait return call, if no return call will outreach in the month of October.    Jone Baseman, RN, MSN [place] [facility] Care Management Coordinator Direct Line [phone] Cell [phone] Toll Free: [phone]  Fax: [phone]

## 2020-07-06 DIAGNOSIS — M2569 Stiffness of other specified joint, not elsewhere classified: Secondary | ICD-10-CM | POA: Diagnosis not present

## 2020-07-06 DIAGNOSIS — M6281 Muscle weakness (generalized): Secondary | ICD-10-CM | POA: Diagnosis not present

## 2020-07-06 DIAGNOSIS — M799 Soft tissue disorder, unspecified: Secondary | ICD-10-CM | POA: Diagnosis not present

## 2020-07-06 DIAGNOSIS — M545 Low back pain: Secondary | ICD-10-CM | POA: Diagnosis not present

## 2020-07-10 DIAGNOSIS — F411 Generalized anxiety disorder: Secondary | ICD-10-CM | POA: Diagnosis not present

## 2020-07-11 DIAGNOSIS — M2569 Stiffness of other specified joint, not elsewhere classified: Secondary | ICD-10-CM | POA: Diagnosis not present

## 2020-07-11 DIAGNOSIS — M799 Soft tissue disorder, unspecified: Secondary | ICD-10-CM | POA: Diagnosis not present

## 2020-07-11 DIAGNOSIS — M545 Low back pain: Secondary | ICD-10-CM | POA: Diagnosis not present

## 2020-07-11 DIAGNOSIS — M6281 Muscle weakness (generalized): Secondary | ICD-10-CM | POA: Diagnosis not present

## 2020-07-17 ENCOUNTER — Other Ambulatory Visit: Payer: Self-pay

## 2020-07-17 ENCOUNTER — Ambulatory Visit (INDEPENDENT_AMBULATORY_CARE_PROVIDER_SITE_OTHER): Payer: Medicare HMO

## 2020-07-17 ENCOUNTER — Encounter: Payer: Self-pay | Admitting: Podiatry

## 2020-07-17 ENCOUNTER — Ambulatory Visit: Payer: Medicare HMO | Admitting: Podiatry

## 2020-07-17 DIAGNOSIS — M779 Enthesopathy, unspecified: Secondary | ICD-10-CM | POA: Diagnosis not present

## 2020-07-17 DIAGNOSIS — M7751 Other enthesopathy of right foot: Secondary | ICD-10-CM | POA: Diagnosis not present

## 2020-07-18 DIAGNOSIS — M545 Low back pain: Secondary | ICD-10-CM | POA: Diagnosis not present

## 2020-07-18 DIAGNOSIS — M799 Soft tissue disorder, unspecified: Secondary | ICD-10-CM | POA: Diagnosis not present

## 2020-07-18 DIAGNOSIS — M2569 Stiffness of other specified joint, not elsewhere classified: Secondary | ICD-10-CM | POA: Diagnosis not present

## 2020-07-18 DIAGNOSIS — M6281 Muscle weakness (generalized): Secondary | ICD-10-CM | POA: Diagnosis not present

## 2020-07-20 DIAGNOSIS — M6281 Muscle weakness (generalized): Secondary | ICD-10-CM | POA: Diagnosis not present

## 2020-07-20 DIAGNOSIS — M799 Soft tissue disorder, unspecified: Secondary | ICD-10-CM | POA: Diagnosis not present

## 2020-07-20 DIAGNOSIS — M2569 Stiffness of other specified joint, not elsewhere classified: Secondary | ICD-10-CM | POA: Diagnosis not present

## 2020-07-20 DIAGNOSIS — M545 Low back pain: Secondary | ICD-10-CM | POA: Diagnosis not present

## 2020-07-21 DIAGNOSIS — Z01118 Encounter for examination of ears and hearing with other abnormal findings: Secondary | ICD-10-CM | POA: Diagnosis not present

## 2020-07-21 DIAGNOSIS — H903 Sensorineural hearing loss, bilateral: Secondary | ICD-10-CM | POA: Diagnosis not present

## 2020-07-24 NOTE — Progress Notes (Signed)
Subjective: 79 year old female presents the office today for concerns of calluses on her right foot.  She states that she keeps moisturizer on them but they keep coming back.  Denies any open sores or any redness or swelling or any drainage. Denies any systemic complaints such as fevers, chills, nausea, vomiting. No acute changes since last appointment, and no other complaints at this time.   Objective: AAO x3, NAD DP/PT pulses palpable bilaterally, CRT less than 3 seconds Bunions present with hallux abductus resulting hyperkeratotic lesion on the medial hallux IPJ as well as the first MPJ.  There is no underlying ulceration drainage or any signs of infection. No pain with calf compression, swelling, warmth, erythema  Assessment: Preulcerative calluses right foot  Plan: -All treatment options discussed with the patient including all alternatives, risks, complications.  -Debrided hyperkeratotic lesions without any complications or bleeding.  Continue moisturizer as well as offloading shoes.  Monitoring skin breakdown.  Discussed with her likely calluses are coming back due to the bunion and pressure. -Patient encouraged to call the office with any questions, concerns, change in symptoms.   Trula Slade DPM

## 2020-07-25 DIAGNOSIS — R600 Localized edema: Secondary | ICD-10-CM | POA: Diagnosis not present

## 2020-07-25 DIAGNOSIS — G501 Atypical facial pain: Secondary | ICD-10-CM | POA: Diagnosis not present

## 2020-07-25 DIAGNOSIS — Z0001 Encounter for general adult medical examination with abnormal findings: Secondary | ICD-10-CM | POA: Diagnosis not present

## 2020-07-25 DIAGNOSIS — L209 Atopic dermatitis, unspecified: Secondary | ICD-10-CM | POA: Diagnosis not present

## 2020-07-25 DIAGNOSIS — R21 Rash and other nonspecific skin eruption: Secondary | ICD-10-CM | POA: Diagnosis not present

## 2020-07-25 DIAGNOSIS — F411 Generalized anxiety disorder: Secondary | ICD-10-CM | POA: Diagnosis not present

## 2020-07-25 DIAGNOSIS — R197 Diarrhea, unspecified: Secondary | ICD-10-CM | POA: Diagnosis not present

## 2020-07-25 DIAGNOSIS — M545 Low back pain: Secondary | ICD-10-CM | POA: Diagnosis not present

## 2020-07-25 DIAGNOSIS — R110 Nausea: Secondary | ICD-10-CM | POA: Diagnosis not present

## 2020-07-25 DIAGNOSIS — M79674 Pain in right toe(s): Secondary | ICD-10-CM | POA: Diagnosis not present

## 2020-07-25 DIAGNOSIS — Z0000 Encounter for general adult medical examination without abnormal findings: Secondary | ICD-10-CM | POA: Diagnosis not present

## 2020-07-25 DIAGNOSIS — F5105 Insomnia due to other mental disorder: Secondary | ICD-10-CM | POA: Diagnosis not present

## 2020-07-25 LAB — TSH: TSH: 0.6 (ref <=5.90)

## 2020-07-27 DIAGNOSIS — M2569 Stiffness of other specified joint, not elsewhere classified: Secondary | ICD-10-CM | POA: Diagnosis not present

## 2020-07-27 DIAGNOSIS — M6281 Muscle weakness (generalized): Secondary | ICD-10-CM | POA: Diagnosis not present

## 2020-07-27 DIAGNOSIS — M799 Soft tissue disorder, unspecified: Secondary | ICD-10-CM | POA: Diagnosis not present

## 2020-07-27 DIAGNOSIS — M545 Low back pain: Secondary | ICD-10-CM | POA: Diagnosis not present

## 2020-08-01 DIAGNOSIS — M799 Soft tissue disorder, unspecified: Secondary | ICD-10-CM | POA: Diagnosis not present

## 2020-08-01 DIAGNOSIS — M6281 Muscle weakness (generalized): Secondary | ICD-10-CM | POA: Diagnosis not present

## 2020-08-01 DIAGNOSIS — M545 Low back pain: Secondary | ICD-10-CM | POA: Diagnosis not present

## 2020-08-01 DIAGNOSIS — M2569 Stiffness of other specified joint, not elsewhere classified: Secondary | ICD-10-CM | POA: Diagnosis not present

## 2020-08-02 ENCOUNTER — Other Ambulatory Visit: Payer: Self-pay

## 2020-08-02 ENCOUNTER — Encounter: Payer: Self-pay | Admitting: Nurse Practitioner

## 2020-08-02 ENCOUNTER — Ambulatory Visit: Payer: Medicare HMO | Admitting: Nurse Practitioner

## 2020-08-02 VITALS — BP 132/78 | HR 77 | Ht 64.0 in | Wt 139.8 lb

## 2020-08-02 DIAGNOSIS — E039 Hypothyroidism, unspecified: Secondary | ICD-10-CM

## 2020-08-02 MED ORDER — LEVOTHYROXINE SODIUM 50 MCG PO TABS
50.0000 ug | ORAL_TABLET | Freq: Every day | ORAL | 3 refills | Status: DC
Start: 2020-08-02 — End: 2021-05-21

## 2020-08-02 NOTE — Patient Instructions (Signed)
-   The correct intake of thyroid hormone (Levothyroxine, Synthroid), is on empty stomach first thing in the morning, with water, separated by at least 30 minutes from breakfast and other medications,  and separated by more than 4 hours from calcium, iron, multivitamins, acid reflux medications (PPIs). ° °- This medication is a life-long medication and will be needed to correct thyroid hormone imbalances for the rest of your life.  The dose may change from time to time, based on thyroid blood work. ° °- It is extremely important to be consistent taking this medication, near the same time each morning. °

## 2020-08-02 NOTE — Progress Notes (Signed)
Endocrinology Consult Note                                         08/02/2020, 11:09 AM   [name] is a 79 y.o.-year-old female patient being seen in consultation for hypothyroidism referred by [name], MD.   Past Medical History:  Diagnosis Date  . Anemia   . Anxiety   . Arthritis   . Asthma   . Constipation   . GERD (gastroesophageal reflux disease)   . High blood pressure   . Hypothyroidism   . Seasonal allergies   . Thyroid disease   . Varicose veins   . Vertigo 11/23/2013    Past Surgical History:  Procedure Laterality Date  . APPENDECTOMY    . CATARACT EXTRACTION W/PHACO Left 01/27/2017   Procedure: CATARACT EXTRACTION PHACO AND INTRAOCULAR LENS PLACEMENT (IOC);  Surgeon: Tonny Branch, MD;  Location: [facility];  Service: Ophthalmology;  Laterality: Left;  CDE: 29.90  . CATARACT EXTRACTION W/PHACO Right 02/24/2017   Procedure: CATARACT EXTRACTION PHACO AND INTRAOCULAR LENS PLACEMENT (IOC);  Surgeon: Tonny Branch, MD;  Location: [facility];  Service: Ophthalmology;  Laterality: Right;  CDE: 8.57  . CHOLECYSTECTOMY    . COLONOSCOPY N/A 10/01/2016   Procedure: COLONOSCOPY;  Surgeon: Daneil Dolin, MD; pancolonic diverticulosis, melanosis coli, otherwise normal exam.  No recommendations to repeat colonoscopy.  Marland Kitchen FLEXOR TENDON REPAIR Right 11/06/2017   Procedure: RIGHT WRIST FLEXOR TENDON REPAIRAND STT [place];  Surgeon: Leanora Cover, MD;  Location: [place];  Service: Orthopedics;  Laterality: Right;  . TUBAL LIGATION      Social History   Socioeconomic History  . Marital status: Married    Spouse name: Not on file  . Number of children: Not on file  . Years of education: Not on file  . Highest education level: Not on file  Occupational History  . Not on file  Tobacco Use  . Smoking status: Former Smoker    Packs/day: 0.25    Years: 5.00    Pack years: 1.25     Types: Cigarettes    Quit date: 12/02/1997    Years since quitting: 22.6  . Smokeless tobacco: Never Used  Vaping Use  . Vaping Use: Never used  Substance and Sexual Activity  . Alcohol use: Yes    Alcohol/week: 1.0 standard drink    Types: 1 Glasses of wine per week    Comment: occasional wine  . Drug use: No  . Sexual activity: Yes    Birth control/protection: Surgical    Comment: tubal  Other Topics Concern  . Not on file  Social History Narrative  . Not on file   Social Determinants of Health   Financial Resource Strain: Low Risk   . Difficulty of Paying Living Expenses: Not hard at all  Food Insecurity: No Food Insecurity  . Worried About Charity fundraiser in the Last Year: Never true  .  Ran Out of Food in the Last Year: Never true  Transportation Needs: No Transportation Needs  . Lack of Transportation (Medical): No  . Lack of Transportation (Non-Medical): No  Physical Activity: Inactive  . Days of Exercise per Week: 0 days  . Minutes of Exercise per Session: 0 min  Stress: Stress Concern Present  . Feeling of Stress : To some extent  Social Connections: Socially Integrated  . Frequency of Communication with Friends and Family: More than three times a week  . Frequency of Social Gatherings with Friends and Family: More than three times a week  . Attends Religious Services: More than 4 times per year  . Active Member of Clubs or Organizations: Yes  . Attends Archivist Meetings: More than 4 times per year  . Marital Status: Married    Family History  Problem Relation Age of Onset  . Hypertension Mother   . Varicose Veins Father   . Heart attack Father   . Heart disease Father   . Hypertension Brother   . COPD Brother     Outpatient Encounter Medications as of 08/02/2020  Medication Sig  . albuterol (VENTOLIN HFA) 108 (90 Base) MCG/ACT inhaler Inhale 2 puffs into the lungs every 6 (six) hours as needed for wheezing or shortness of breath.  .  ALPRAZolam (XANAX) 0.5 MG tablet Take 0.5 mg by mouth 3 (three) times daily as needed for anxiety.   Marland Kitchen amLODipine (NORVASC) 2.5 MG tablet Take 2.5 mg by mouth every evening.   Marland Kitchen aspirin EC 81 MG tablet Take 81 mg by mouth daily.  . chlorhexidine (PERIDEX) 0.12 % solution 2 (two) times daily.  Mariane Baumgarten Sodium (STOOL SOFTENER LAXATIVE PO) Take by mouth daily. Takes 2 daily  . fluticasone (FLONASE) 50 MCG/ACT nasal spray Place 2 sprays into both nostrils as needed.   Marland Kitchen losartan (COZAAR) 50 MG tablet Take 25 mg by mouth daily.   . Multiple Vitamin (MULTIVITAMIN) tablet Take 1 tablet by mouth daily.  . ondansetron (ZOFRAN) 4 MG tablet Take 4 mg by mouth every 6 (six) hours as needed.  . pantoprazole (PROTONIX) 40 MG tablet Take 1 tablet (40 mg total) by mouth 2 (two) times daily before a meal.  . polyethylene glycol (MIRALAX / GLYCOLAX) 17 g packet Take 17 g by mouth daily.  . raloxifene (EVISTA) 60 MG tablet Take 1 tablet (60 mg total) by mouth every morning. (Patient taking differently: Take 60 mg by mouth every other day. )  . SYMBICORT 80-4.5 MCG/ACT inhaler INHALE 2 PUFFS INTO THE LUNGS TWICE DAILY. (Patient taking differently: Inhale 2 puffs into the lungs daily. QD am)  . traZODone (DESYREL) 100 MG tablet Take 50-100 mg by mouth at bedtime.  . triamterene-hydrochlorothiazide (MAXZIDE-25) 37.5-25 MG tablet Take 1 tablet by mouth as needed.   . venlafaxine XR (EFFEXOR-XR) 37.5 MG 24 hr capsule Take 37.5 mg by mouth at bedtime.  . [DISCONTINUED] levothyroxine (SYNTHROID) 75 MCG tablet Take 75 mcg by mouth daily.   Marland Kitchen EPINEPHrine 0.3 mg/0.3 mL IJ SOAJ injection Inject 0.3 mLs (0.3 mg total) into the muscle once as needed for anaphylaxis. (Patient not taking: Reported on 08/02/2020)  . levothyroxine (SYNTHROID) 50 MCG tablet Take 1 tablet (50 mcg total) by mouth daily.  Marland Kitchen MAGNESIUM PO Take 500 mg by mouth daily. (Patient not taking: Reported on 08/02/2020)  . sucralfate (CARAFATE) 1 g tablet Take 1 g  by mouth 3 (three) times daily. (Patient not taking: Reported on 08/02/2020)  No facility-administered encounter medications on file as of 08/02/2020.    ALLERGIES: Allergies  Allergen Reactions  . Amoxicillin-Pot Clavulanate Nausea And Vomiting, Other (See Comments), Itching and Nausea Only    Has patient had a PCN reaction causing immediate rash, facial/tongue/throat swelling, SOB or lightheadedness with hypotension: No Has patient had a PCN reaction causing severe rash involving mucus membranes or skin necrosis: No Has patient had a PCN reaction that required hospitalization: No Has patient had a PCN reaction occurring within the last 10 years: No If all of the above answers are "NO", then may proceed with Cephalosporin use. Has patient had a PCN reaction causing immediate rash, facial/tongue/throat swelling, SOB or lightheadedness with hypotension: No Has patient had a PCN reaction causing severe rash involving mucus membranes or skin necrosis: No Has patient had a PCN reaction that required hospitalization: No Has patient had a PCN reaction occurring within the last 10 years: No If all of the above answers are "NO", then may proceed with Cephalosporin use.   . Ace Inhibitors Cough  . Entex La Itching  . Levofloxacin Hives and Itching  . Meloxicam    VACCINATION STATUS: Immunization History  Administered Date(s) Administered  . Influenza Whole 09/01/2016  . Influenza, High Dose Seasonal PF 09/11/2018  . Influenza-Unspecified 08/02/2013, 08/29/2015  . PFIZER SARS-COV-2 Vaccination 12/16/2019, 01/17/2020     HPI    [name]  is a patient with the above medical history. she was diagnosed with hypothyroidism at approximate age of 47 years by her PCP, which required subsequnt initiation of thyroid hormone. she was given various doses of Synthroid over the years (mostly 50 mcg), and is currently on 75 mcg. she reports compliance to this medication:  Taking it daily on empty  stomach  with water, separated by >30 minutes before breakfast and other medications.  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  She is accompanied by her daughter to today's visit.  I reviewed patient's thyroid tests:  Lab Results  Component Value Date   TSH 0.60 07/25/2020    No thyroid imaging to review.  Pt describes: - weight loss - fatigue/anxiety - palpitations - tremors - insomnia  Pt denies feeling nodules in neck, hoarseness, dysphagia/odynophagia, SOB with lying down.  she has a family history of thyroid disorders in her daughter (hypothyroidism with nodules).  No family history of thyroid cancer.  No history of radiation therapy to head or neck. No recent use of iodine supplements.  I reviewed her chart and she also has a history of GERD with ulcers, depression/anxiety, anemia, asthma, and high blood pressure.   ROS:  Constitutional: + unintentional weight loss, + fatigue, no subjective hyperthermia, no subjective hypothermia Eyes: no blurry vision, no xerophthalmia ENT: no sore throat, no nodules palpated in throat, no dysphagia/odynophagia, no hoarseness Cardiovascular: no Chest Pain, no Shortness of Breath, + palpitations, + leg swelling Respiratory: no cough, no SOB Gastrointestinal: no Nausea/Vomiting/Diarrhea Musculoskeletal: no muscle/joint aches Skin: no rashes Neurological: + tremors, no numbness, no tingling, no dizziness Psychiatric: no depression, + anxiety, + insomnia    Physical Exam: BP 132/78 (BP Location: Left Arm, Patient Position: Sitting)   Pulse 77   Ht 5\' 4"  (1.626 m)   Wt 139 lb 12.8 oz (63.4 kg)   BMI 24.00 kg/m  Wt Readings from Last 3 Encounters:  08/02/20 139 lb 12.8 oz (63.4 kg)  06/15/20 143 lb 12.8 oz (65.2 kg)  05/08/20 151 lb (68.5 kg)  BP Readings from Last 3 Encounters:  08/02/20 132/78  06/15/20 (!) 153/89  05/08/20 (!) 133/97    Constitutional:  Body mass index is 24 kg/m.,  appears anxious, normal state of mind Eyes: PERRLA, EOMI, no exophthalmos ENT: moist mucous membranes, no thyromegaly, no cervical lymphadenopathy Cardiovascular: normal precordial activity, Regular Rate and Rhythm, no Murmur/Rubs/Gallops Respiratory:  adequate breathing efforts, no gross chest deformity, Clear to auscultation bilaterally Gastrointestinal: abdomen soft, Non -tender, No distension, Bowel Sounds present Musculoskeletal: no gross deformities, strength intact in all four extremities Skin: moist, warm, no rashes Neurological: + tremor with outstretched hands, Deep tendon reflexes normal in all four extremities.   CMP ( most recent) CMP     Component Value Date/Time   NA 135 05/08/2020 1232   NA 141 09/24/2018 1204   K 3.8 05/08/2020 1232   CL 100 05/08/2020 1232   CO2 26 05/08/2020 1232   GLUCOSE 101 (H) 05/08/2020 1232   BUN 12 05/08/2020 1232   BUN 13 09/24/2018 1204   CREATININE 0.80 05/08/2020 1232   CALCIUM 9.0 05/08/2020 1232   PROT 7.4 05/08/2020 1232   PROT 6.5 09/24/2018 1204   ALBUMIN 4.4 05/08/2020 1232   ALBUMIN 4.3 09/24/2018 1204   AST 29 05/08/2020 1232   ALT 29 05/08/2020 1232   ALKPHOS 62 05/08/2020 1232   BILITOT 0.5 05/08/2020 1232   BILITOT 0.4 09/24/2018 1204   GFRNONAA >60 05/08/2020 1232   GFRAA >60 05/08/2020 1232     Diabetic Labs (most recent): No results found for: HGBA1C   Lipid Panel ( most recent) Lipid Panel  No results found for: CHOL, TRIG, HDL, CHOLHDL, VLDL, LDLCALC, LDLDIRECT, LABVLDL     Lab Results  Component Value Date   TSH 0.60 07/25/2020       ASSESSMENT: 1. Hypothyroidism- unspecified etiology  PLAN:    Patient with long-standing hypothyroidism, on levothyroxine therapy. On physical exam, patient  does not have gross goiter, thyroid nodules, or neck compression symptoms.  Will obtain thyroid ultrasound to assess baseline thyroid anatomy. -Her previsit thyroid function tests indicate over-replacement of  her thyroid hormone.  She is advised to reduce her dose of Levothyroxine to 50 mcg po daily before breakfast.  - We discussed about correct intake of levothyroxine, at fasting, with water, separated by at least 30 minutes from breakfast. -Patient is made aware of the fact that thyroid hormone replacement is needed for life, dose to be adjusted by periodic monitoring of thyroid function tests. - Will check more comprehensive thyroid tests before next visit in 9 weeks.  - Time spent with the patient: 45 minutes, of which >50% was spent in obtaining information about her symptoms, reviewing her previous labs, evaluations, and treatments, counseling her about her  hypothyroidism, and developing a plan to confirm the diagnosis and long term treatment as necessary. Please refer to " Patient Self Inventory" in the Media  tab for reviewed elements of pertinent patient history.  [name] participated in the discussions, expressed understanding, and voiced agreement with the above plans.  All questions were answered to her satisfaction. she is encouraged to contact clinic should she have any questions or concerns prior to her return visit.   Follow Up: Return in about 9 weeks (around 10/04/2020) for Thyroid follow up, Previsit labs, thyroid ultrasound.    Rayetta Pigg, FNP-BC [facility] Phone: [phone] Fax: [phone]  08/02/2020, 11:09 AM  This note was partially dictated with voice recognition software. Similar sounding words can be  transcribed inadequately or may not  be corrected upon review.

## 2020-08-04 DIAGNOSIS — R05 Cough: Secondary | ICD-10-CM | POA: Diagnosis not present

## 2020-08-08 ENCOUNTER — Telehealth: Payer: Self-pay | Admitting: "Endocrinology

## 2020-08-08 DIAGNOSIS — E876 Hypokalemia: Secondary | ICD-10-CM | POA: Diagnosis not present

## 2020-08-08 DIAGNOSIS — F411 Generalized anxiety disorder: Secondary | ICD-10-CM | POA: Diagnosis not present

## 2020-08-08 DIAGNOSIS — E039 Hypothyroidism, unspecified: Secondary | ICD-10-CM | POA: Diagnosis not present

## 2020-08-08 DIAGNOSIS — F331 Major depressive disorder, recurrent, moderate: Secondary | ICD-10-CM | POA: Diagnosis not present

## 2020-08-08 DIAGNOSIS — R600 Localized edema: Secondary | ICD-10-CM | POA: Diagnosis not present

## 2020-08-08 DIAGNOSIS — F5105 Insomnia due to other mental disorder: Secondary | ICD-10-CM | POA: Diagnosis not present

## 2020-08-08 NOTE — Telephone Encounter (Signed)
Patient said that the last 5 days she has been dizzy/swimmy headed and thought maybe it was because you decreased her thyroid medication. She states that her ultrasound is scheduled for 9/28 and she is still losing weight. She would like to know should she have that sooner?

## 2020-08-08 NOTE — Telephone Encounter (Signed)
It will take some time before the new dose of levothyroxine will take effect.  She is still experiencing the same symptoms she had when she came last week.  Moving up her [place] isn't necessary just yet.  Advise her to continue with the current dose for now and assure her it will take some time for things to level out.

## 2020-08-08 NOTE — Telephone Encounter (Signed)
I spoke with patient and advised her that it will take some time to level out, she is also taking Protonix BID, the spacing for the thyroid medication and when she takes Protonix is 2 hours between , is that okay or does it have to be longer as her other MD told her to take Protonix 30 minutes before breakfast and dinner.

## 2020-08-09 NOTE — Telephone Encounter (Signed)
As close to 2 hours that she can get is sufficient to allow optimal absorption of both medications.

## 2020-08-09 NOTE — Telephone Encounter (Signed)
RETURNED CALL TO PT AND ADVISED, VERBALIZED UNDERSTANDING

## 2020-08-10 DIAGNOSIS — M545 Low back pain: Secondary | ICD-10-CM | POA: Diagnosis not present

## 2020-08-10 DIAGNOSIS — M799 Soft tissue disorder, unspecified: Secondary | ICD-10-CM | POA: Diagnosis not present

## 2020-08-10 DIAGNOSIS — M2569 Stiffness of other specified joint, not elsewhere classified: Secondary | ICD-10-CM | POA: Diagnosis not present

## 2020-08-10 DIAGNOSIS — M6281 Muscle weakness (generalized): Secondary | ICD-10-CM | POA: Diagnosis not present

## 2020-08-15 DIAGNOSIS — M799 Soft tissue disorder, unspecified: Secondary | ICD-10-CM | POA: Diagnosis not present

## 2020-08-15 DIAGNOSIS — M6281 Muscle weakness (generalized): Secondary | ICD-10-CM | POA: Diagnosis not present

## 2020-08-15 DIAGNOSIS — M545 Low back pain: Secondary | ICD-10-CM | POA: Diagnosis not present

## 2020-08-15 DIAGNOSIS — M2569 Stiffness of other specified joint, not elsewhere classified: Secondary | ICD-10-CM | POA: Diagnosis not present

## 2020-08-16 DIAGNOSIS — R21 Rash and other nonspecific skin eruption: Secondary | ICD-10-CM | POA: Diagnosis not present

## 2020-08-16 DIAGNOSIS — R197 Diarrhea, unspecified: Secondary | ICD-10-CM | POA: Diagnosis not present

## 2020-08-16 DIAGNOSIS — L209 Atopic dermatitis, unspecified: Secondary | ICD-10-CM | POA: Diagnosis not present

## 2020-08-16 DIAGNOSIS — M545 Low back pain: Secondary | ICD-10-CM | POA: Diagnosis not present

## 2020-08-16 DIAGNOSIS — G501 Atypical facial pain: Secondary | ICD-10-CM | POA: Diagnosis not present

## 2020-08-16 DIAGNOSIS — Z0000 Encounter for general adult medical examination without abnormal findings: Secondary | ICD-10-CM | POA: Diagnosis not present

## 2020-08-16 DIAGNOSIS — M79674 Pain in right toe(s): Secondary | ICD-10-CM | POA: Diagnosis not present

## 2020-08-16 DIAGNOSIS — R110 Nausea: Secondary | ICD-10-CM | POA: Diagnosis not present

## 2020-08-16 DIAGNOSIS — Z0001 Encounter for general adult medical examination with abnormal findings: Secondary | ICD-10-CM | POA: Diagnosis not present

## 2020-08-17 DIAGNOSIS — M2569 Stiffness of other specified joint, not elsewhere classified: Secondary | ICD-10-CM | POA: Diagnosis not present

## 2020-08-17 DIAGNOSIS — M545 Low back pain: Secondary | ICD-10-CM | POA: Diagnosis not present

## 2020-08-17 DIAGNOSIS — M6281 Muscle weakness (generalized): Secondary | ICD-10-CM | POA: Diagnosis not present

## 2020-08-17 DIAGNOSIS — M799 Soft tissue disorder, unspecified: Secondary | ICD-10-CM | POA: Diagnosis not present

## 2020-08-21 ENCOUNTER — Encounter: Payer: Self-pay | Admitting: Adult Health

## 2020-08-21 ENCOUNTER — Ambulatory Visit: Payer: Medicare HMO | Admitting: Gastroenterology

## 2020-08-21 DIAGNOSIS — F331 Major depressive disorder, recurrent, moderate: Secondary | ICD-10-CM | POA: Diagnosis not present

## 2020-08-21 DIAGNOSIS — Z0001 Encounter for general adult medical examination with abnormal findings: Secondary | ICD-10-CM | POA: Diagnosis not present

## 2020-08-21 DIAGNOSIS — E1169 Type 2 diabetes mellitus with other specified complication: Secondary | ICD-10-CM | POA: Diagnosis not present

## 2020-08-21 DIAGNOSIS — I10 Essential (primary) hypertension: Secondary | ICD-10-CM | POA: Diagnosis not present

## 2020-08-21 DIAGNOSIS — M8580 Other specified disorders of bone density and structure, unspecified site: Secondary | ICD-10-CM | POA: Diagnosis not present

## 2020-08-21 DIAGNOSIS — E782 Mixed hyperlipidemia: Secondary | ICD-10-CM | POA: Diagnosis not present

## 2020-08-21 DIAGNOSIS — J45909 Unspecified asthma, uncomplicated: Secondary | ICD-10-CM | POA: Diagnosis not present

## 2020-08-21 DIAGNOSIS — Z1231 Encounter for screening mammogram for malignant neoplasm of breast: Secondary | ICD-10-CM | POA: Diagnosis not present

## 2020-08-21 DIAGNOSIS — M5430 Sciatica, unspecified side: Secondary | ICD-10-CM | POA: Diagnosis not present

## 2020-08-21 DIAGNOSIS — E039 Hypothyroidism, unspecified: Secondary | ICD-10-CM | POA: Diagnosis not present

## 2020-08-21 DIAGNOSIS — K219 Gastro-esophageal reflux disease without esophagitis: Secondary | ICD-10-CM | POA: Diagnosis not present

## 2020-08-22 DIAGNOSIS — M545 Low back pain: Secondary | ICD-10-CM | POA: Diagnosis not present

## 2020-08-22 DIAGNOSIS — M6281 Muscle weakness (generalized): Secondary | ICD-10-CM | POA: Diagnosis not present

## 2020-08-22 DIAGNOSIS — M799 Soft tissue disorder, unspecified: Secondary | ICD-10-CM | POA: Diagnosis not present

## 2020-08-22 DIAGNOSIS — M2569 Stiffness of other specified joint, not elsewhere classified: Secondary | ICD-10-CM | POA: Diagnosis not present

## 2020-08-22 NOTE — Progress Notes (Signed)
Referring Provider: [name], MD Primary Care Physician:  [name], MD Primary GI Physician: Dr. Gala Romney  Chief Complaint  Patient presents with  . Follow-up    no nausea, no constipation with medications   . Weight Loss    HPI:   [name] is a 79 y.o. female presenting today for follow-up of nausea without vomiting, weight loss, and constipation.  Colonoscopy in October 2017 for screening purposes with pancolonic diverticulosis, melanosis coli, otherwise normal exam.  No recommendations for repeat colonoscopy.  She was last seen in our office 06/15/2020 with request of Dr. Nevada Crane (PCP) for nausea abdominal pain.  Patient's PCP had treated her empirically for colitis with Cipro.  She presented to the emergency room 05/08/2020 with lower abdominal pain, nausea, and loose stool.  She was taking Cipro at that time.  She had CT A/P with contrast revealing diverticulosis without diverticulitis and no other acute findings.  Labs are unrevealing other than WBC 13.0.  At the time of her office visit, she reported new onset nausea in late May/early June.  She had TTP on right side when she saw PCP who started her on Cipro.  After antibiotics, she felt better for about 1 week but nausea returned and was postprandial.  No vomiting.  No abdominal pain.  PCP had started her on Carafate on 6/28 which was not helpful.  She had started taking Zofran in the mornings for the last 2 days which has significantly improved her symptoms.  Noted increased indigestion/acid reflux on Protonix nightly.  Also reported taking 2 ibuprofen every 6-8 hours for neck pain for about 1 month prior to all of her GI symptoms starting.  Stopped ibuprofen in June.  She had completed H. pylori breath test with PCP that was negative but she did not hold Protonix.  History of chronic constipation taking OTC stool softeners.  Had recently added MiraLAX as needed for mild breakthrough constipation which was controlling symptoms  well.  Occasionally stools may get loose/"stringy".  No BRBPR or melena.  She was advised to increase Protonix to twice daily, stop Carafate, low-fat diet, avoid all NSAIDs, continue stool softeners and MiraLAX as needed, follow-up in 2 months.  If nausea not improved, would need to consider EGD.  Requested she call if no improvement in 4 weeks.  No progress report received.  Today:  Weight loss: Down an additional 8 lbs in the last 2 months. Total of 24 lbs in the last year.   Nausea has resolved. Taking PPI BID. No longer taking Zofran. Nausea has been gone for over 1 month. Still following a bland diet. Not drinking milk or eating ice cream. No fried foods, dairy products, no chocolate. Eating vegetables, salmon, chicken. Feels she could eat more if her diet was not so restricted. States she thought she was not supposed to eat any of these items as I had previously advised her to follow a bland diet. No abdominal pain. Appetite has been down for the last few months. Eating 3 meals a day. No early satiety. No dysaphia. GERD is well controlled.   Has not taken any ibuprofen since June.  Recently started Effexor 2 months ago. Anorexia is a possible side effect occurring 8-22% of the time.   BMs every 2-3 days. Stools are solid. Notes having a lot of thin stools rather than normal caliber stools. Has some incomplete emptying. Taking 2 stool softeners at bed time. If she doesn't have a BM in  2-3 days, she will take MirALAX 1 capful at bedtime. Has been taking MiraLAX for the last 4-5 days and is wanting to know if it is okay to take this daily.  Sometimes she will have a BM daily. No blood in the stool. No black stools.   Past Medical History:  Diagnosis Date  . Anemia   . Anxiety   . Arthritis   . Asthma   . Constipation   . GERD (gastroesophageal reflux disease)   . High blood pressure   . Hypothyroidism   . Seasonal allergies   . Thyroid disease   . Varicose veins   . Vertigo 11/23/2013     Past Surgical History:  Procedure Laterality Date  . APPENDECTOMY    . CATARACT EXTRACTION W/PHACO Left 01/27/2017   Procedure: CATARACT EXTRACTION PHACO AND INTRAOCULAR LENS PLACEMENT (IOC);  Surgeon: Tonny Branch, MD;  Location: [facility];  Service: Ophthalmology;  Laterality: Left;  CDE: 29.90  . CATARACT EXTRACTION W/PHACO Right 02/24/2017   Procedure: CATARACT EXTRACTION PHACO AND INTRAOCULAR LENS PLACEMENT (IOC);  Surgeon: Tonny Branch, MD;  Location: [facility];  Service: Ophthalmology;  Laterality: Right;  CDE: 8.57  . CHOLECYSTECTOMY    . COLONOSCOPY N/A 10/01/2016   Procedure: COLONOSCOPY;  Surgeon: Daneil Dolin, MD; pancolonic diverticulosis, melanosis coli, otherwise normal exam.  No recommendations to repeat colonoscopy.  Marland Kitchen FLEXOR TENDON REPAIR Right 11/06/2017   Procedure: RIGHT WRIST FLEXOR TENDON REPAIRAND STT [place];  Surgeon: Leanora Cover, MD;  Location: [place];  Service: Orthopedics;  Laterality: Right;  . TUBAL LIGATION      Current Outpatient Medications  Medication Sig Dispense Refill  . albuterol (VENTOLIN HFA) 108 (90 Base) MCG/ACT inhaler Inhale 2 puffs into the lungs every 6 (six) hours as needed for wheezing or shortness of breath.    . ALPRAZolam (XANAX) 0.5 MG tablet Take 0.5 mg by mouth 3 (three) times daily as needed for anxiety.     Marland Kitchen amLODipine (NORVASC) 2.5 MG tablet Take 2.5 mg by mouth every evening.     Marland Kitchen aspirin EC 81 MG tablet Take 81 mg by mouth daily.    . chlorhexidine (PERIDEX) 0.12 % solution 2 (two) times daily.    Mariane Baumgarten Sodium (STOOL SOFTENER LAXATIVE PO) Take by mouth daily. Takes 2 daily    . EPINEPHrine 0.3 mg/0.3 mL IJ SOAJ injection Inject 0.3 mLs (0.3 mg total) into the muscle once as needed for anaphylaxis. 2 Device 1  . fluticasone (FLONASE) 50 MCG/ACT nasal spray Place 2 sprays into both nostrils as needed.     Marland Kitchen levothyroxine (SYNTHROID) 50 MCG tablet Take 1 tablet (50 mcg total) by mouth daily. 90 tablet 3  .  losartan (COZAAR) 50 MG tablet Take 25 mg by mouth daily.     . Multiple Vitamin (MULTIVITAMIN) tablet Take 1 tablet by mouth daily.    . ondansetron (ZOFRAN) 4 MG tablet Take 4 mg by mouth every 6 (six) hours as needed.    . pantoprazole (PROTONIX) 40 MG tablet Take 1 tablet (40 mg total) by mouth 2 (two) times daily before a meal. 60 tablet 2  . polyethylene glycol (MIRALAX / GLYCOLAX) 17 g packet Take 17 g by mouth daily.    . raloxifene (EVISTA) 60 MG tablet Take 1 tablet (60 mg total) by mouth every morning. (Patient taking differently: Take 60 mg by mouth every other day. ) 30 tablet 12  . SYMBICORT 80-4.5 MCG/ACT inhaler INHALE 2 PUFFS INTO THE  LUNGS TWICE DAILY. (Patient taking differently: Inhale 2 puffs into the lungs daily. QD am) 10.2 g 0  . traZODone (DESYREL) 100 MG tablet Take 50-100 mg by mouth at bedtime.    . triamterene-hydrochlorothiazide (MAXZIDE-25) 37.5-25 MG tablet Take 1 tablet by mouth as needed.     . venlafaxine XR (EFFEXOR-XR) 37.5 MG 24 hr capsule Take 37.5 mg by mouth at bedtime.    Marland Kitchen MAGNESIUM PO Take 500 mg by mouth daily. (Patient not taking: Reported on 08/02/2020)     No current facility-administered medications for this visit.    Allergies as of 08/23/2020 - Review Complete 08/23/2020  Allergen Reaction Noted  . Amoxicillin-pot clavulanate Nausea And Vomiting, Other (See Comments), Itching, and Nausea Only 10/04/2013  . Ace inhibitors Cough   . Entex la Itching   . Levofloxacin Hives and Itching 01/21/2017  . Meloxicam  04/25/2020    Family History  Problem Relation Age of Onset  . Hypertension Mother   . Varicose Veins Father   . Heart attack Father   . Heart disease Father   . Hypertension Brother   . COPD Brother     Social History   Socioeconomic History  . Marital status: Married    Spouse name: Not on file  . Number of children: Not on file  . Years of education: Not on file  . Highest education level: Not on file  Occupational  History  . Not on file  Tobacco Use  . Smoking status: Former Smoker    Packs/day: 0.25    Years: 5.00    Pack years: 1.25    Types: Cigarettes    Quit date: 12/02/1997    Years since quitting: 22.7  . Smokeless tobacco: Never Used  Vaping Use  . Vaping Use: Never used  Substance and Sexual Activity  . Alcohol use: Yes    Alcohol/week: 1.0 standard drink    Types: 1 Glasses of wine per week    Comment: occasional wine  . Drug use: No  . Sexual activity: Yes    Birth control/protection: Surgical    Comment: tubal  Other Topics Concern  . Not on file  Social History Narrative  . Not on file   Social Determinants of Health   Financial Resource Strain: Low Risk   . Difficulty of Paying Living Expenses: Not hard at all  Food Insecurity: No Food Insecurity  . Worried About Charity fundraiser in the Last Year: Never true  . Ran Out of Food in the Last Year: Never true  Transportation Needs: No Transportation Needs  . Lack of Transportation (Medical): No  . Lack of Transportation (Non-Medical): No  Physical Activity: Inactive  . Days of Exercise per Week: 0 days  . Minutes of Exercise per Session: 0 min  Stress: Stress Concern Present  . Feeling of Stress : To some extent  Social Connections: Socially Integrated  . Frequency of Communication with Friends and Family: More than three times a week  . Frequency of Social Gatherings with Friends and Family: More than three times a week  . Attends Religious Services: More than 4 times per year  . Active Member of Clubs or Organizations: Yes  . Attends Archivist Meetings: More than 4 times per year  . Marital Status: Married    Review of Systems: Gen: Denies fever, chills, cold or flulike symptoms, presyncope, syncope. CV: Denies chest pain or palpitations.. Resp: Denies dyspnea at rest or cough.Marland Kitchen GI: See HPI. Heme:  See HPI  Physical Exam: BP 135/78   Pulse 67   Temp (!) 97.2 F (36.2 C) (Temporal)   Ht 5\' 4"   (1.626 m)   Wt 135 lb (61.2 kg)   BMI 23.17 kg/m  General:   Alert and oriented. No distress noted. Pleasant and cooperative.  Head:  Normocephalic and atraumatic. Eyes:  Conjuctiva clear without scleral icterus. Heart:  S1, S2 present without murmurs appreciated. Lungs:  Clear to auscultation bilaterally. No wheezes, rales, or rhonchi. No distress.  Abdomen:  +BS, soft, non-tender and non-distended. No rebound or guarding. No HSM or masses noted. Msk:  Symmetrical without gross deformities. Normal posture. Extremities:  Without edema. Neurologic:  Alert and  oriented x4 Psych:  Normal mood and affect.

## 2020-08-23 ENCOUNTER — Other Ambulatory Visit: Payer: Self-pay

## 2020-08-23 ENCOUNTER — Encounter: Payer: Self-pay | Admitting: Gastroenterology

## 2020-08-23 ENCOUNTER — Telehealth: Payer: Self-pay | Admitting: *Deleted

## 2020-08-23 ENCOUNTER — Ambulatory Visit: Payer: Medicare HMO | Admitting: Gastroenterology

## 2020-08-23 VITALS — BP 135/78 | HR 67 | Temp 97.2°F | Ht 64.0 in | Wt 135.0 lb

## 2020-08-23 DIAGNOSIS — K5900 Constipation, unspecified: Secondary | ICD-10-CM

## 2020-08-23 DIAGNOSIS — R110 Nausea: Secondary | ICD-10-CM

## 2020-08-23 DIAGNOSIS — R634 Abnormal weight loss: Secondary | ICD-10-CM

## 2020-08-23 NOTE — Telephone Encounter (Signed)
PA for TCS approved via [employer]. Auth# [id number] dates 09/21/2020-10/21/2020

## 2020-08-23 NOTE — Telephone Encounter (Signed)
Called pt. She is scheduled for TCS/EGD with Dr. Gala Romney, propofol, asa 2 on 10/21 at 10:30am. Aware covid test scheduled for 10/20 at 10:00am. Instructions mailed.   PA for TCS/EGD requires clinical review. Records faxed.

## 2020-08-23 NOTE — Patient Instructions (Signed)
We will get you scheduled for colonoscopy and upper endoscopy in the future with Dr. Gala Romney.  Continue taking Protonix 40 mg twice daily 30 minutes before breakfast and dinner for now.  You may advance your diet as tolerated. You do not necessarily need to follow a bland diet.  If you eat anything and noticed this causes problems, just avoid those items.  You may try adding protein shakes twice daily to help maintain your weight.  You may try taking MiraLAX 1 capful (17 g) daily in 8 ounces of water to help with constipation. If this does not keep her constipation well controlled, please let me know and we can try a medication called Linzess.  You can discuss Effexor with your primary care as to whether this might be contributing to your loss of appetite.  We will plan to see back in the office after your procedures. Do not hesitate to call if you have questions or concerns prior.  Aliene Altes, PA-C [facility]

## 2020-08-24 ENCOUNTER — Telehealth: Payer: Self-pay | Admitting: Internal Medicine

## 2020-08-24 ENCOUNTER — Encounter: Payer: Self-pay | Admitting: Gastroenterology

## 2020-08-24 DIAGNOSIS — M6281 Muscle weakness (generalized): Secondary | ICD-10-CM | POA: Diagnosis not present

## 2020-08-24 DIAGNOSIS — M2569 Stiffness of other specified joint, not elsewhere classified: Secondary | ICD-10-CM | POA: Diagnosis not present

## 2020-08-24 DIAGNOSIS — M799 Soft tissue disorder, unspecified: Secondary | ICD-10-CM | POA: Diagnosis not present

## 2020-08-24 DIAGNOSIS — M545 Low back pain: Secondary | ICD-10-CM | POA: Diagnosis not present

## 2020-08-24 NOTE — Assessment & Plan Note (Addendum)
79 year old female with 24 pound weight loss over the last year, 8 pound weight loss in the last 2 months. Suspect weight loss is likely multifactorial. Previously with persistent nausea without vomiting that began in May/June of this year. This is now resolved after increasing Protonix to 40 mg twice daily. She does continue with a decrease in appetite and is following a very restrictive diet as she thought she needed to continue a bland diet per prior recommendations in the setting of nausea. She also notes change in bowel habits. History of mild constipation that has been controlled with stool softeners and MiraLAX as needed. She is requiring MiraLAX more frequently and notes change in stool caliber from "normal" to passing very thin stools. Denies abdominal pain, BRBPR, melena, or any other significant GI symptoms. Colonoscopy in 2017 with pancolonic diverticulosis, melanosis coli, otherwise normal exam. No prior EGD. She started on Effexor a few months ago and anorexia can occur 8-22% of the time with this medication. Her thyroid function has also been on the low side and she is following with Dr. Dorris Fetch for this.  Although weight loss is likely multifactorial, I feel we need to evaluate her upper and lower GI tract to rule out other occult etiologies including malignancy.   Plan: Proceed with EGD + TCS with propofol with Dr. Gala Romney in the near future. The risks, benefits, and alternatives have been discussed with the patient in detail. The patient states understanding and desires to proceed.  ASA II Continue Protonix 40 mg twice daily for now. Advance diet as tolerated. Try adding protein shakes twice daily to help maintain your weight. Take MiraLAX 1 capful (17 g) daily in 8 ounces of water rather than as needed. Requested to let me know if this does not keep constipation well controlled and we can try Linzess. Advised to discuss Effexor with her primary care as this could be contributing to loss of  appetite. Follow-up after procedures.

## 2020-08-24 NOTE — Telephone Encounter (Signed)
Called endo and lmovm to cancel procedure

## 2020-08-24 NOTE — Telephone Encounter (Signed)
PATIENT HAD A QUESTION ABOUT WAITING TO HAVE HER PROCEDURES DONE AFTER SHE SEES HER THYROID DOCTOR AND REGULATES HER MEDIATION   THINKS THE WEIGHT LOSS MAY BE FROM HER THYROID

## 2020-08-24 NOTE — Assessment & Plan Note (Addendum)
Constipation had previously been well controlled on stool softeners and MiraLAX as needed. Seems that she is requiring MiraLAX more frequently and is wanting to know if she can take this daily. Also reports change in stool caliber passing very thin stools rather than her "normal" caliber. Also with some incomplete emptying. Denies BRBPR. Notably, she has had 24 pound weight loss in the last year, 8 pound weight loss in the last 2 months which is discussed below. Last colonoscopy in October 2017 for screening purposes with pancolonic diverticulosis, melanosis coli, otherwise normal exam.  Considering weight loss and change in bowel habits/stool caliber, I have offered patient a colonoscopy.  Plan: 1. Start MiraLAX 1 capful (17 g) daily in 8 ounces of water rather than as needed. Requested she let me know if this does not control her constipation well and we can try a prescriptive medication. 2. Proceed with colonoscopy with propofol with Dr. Gala Romney in the near future.The risks, benefits, and alternatives have been discussed with the patient in detail. The patient states understanding and desires to proceed.  ASA II Follow-up after procedure.

## 2020-08-24 NOTE — Assessment & Plan Note (Signed)
Patient had previously noted daily nausea without vomiting that began in late May/early June. Also with increased indigestion at that time and reported taking 400 mg ibuprofen every 6-8 hours x 1 month prior to onset of symptoms. Nausea has now resolved s/p increasing Protonix to 40 mg twice daily. Suspect symptoms may have been secondary to uncontrolled GERD, NSAID induced gastritis, or possible PUD. Despite resolution of nausea, patient has continued with weight loss and decreased appetite which is discussed below.  I advised that she continue Protonix 40 mg twice daily for now. I have also offered her an upper endoscopy to further evaluate her weight loss and decreased appetite.  Plan: Continue Protonix 40 mg twice daily. Advance diet as tolerated. Proceed with EGD with propofol with Dr. Gala Romney in the near future. The risks, benefits, and alternatives have been discussed with the patient in detail. The patient states understanding and desires to proceed.  ASA II Follow-up after procedure.

## 2020-08-24 NOTE — Telephone Encounter (Signed)
Spoke with pt. Pt wants [name], [place] know that she's going to have to cancel her procedure with our office until she can get her thyroid under control. Pt saw her endocrinologist and was told her medication is too hight and causing pt not to sleep. They also have arranged blood work and follow up apt on 10/06/20. Pt would prefer to see what's going on with her thyroid since it could be causing weight loss .   Mindy-please cancel pts procedure.

## 2020-08-25 NOTE — Telephone Encounter (Signed)
Noted. I am not sure her thyroid is causing all of her weight loss. I do feel she needs procedure to evaluate her GI tract. She can call back when she is ready to reschedule. We do not have a follow-up appointment scheduled as we were going to follow-up after her procedures. If she would like, we can arrange a 3 month follow-up.

## 2020-08-25 NOTE — Telephone Encounter (Signed)
Noted. Spoke with pt. Pt does want to follow up. Pt scheduled for 11/02/2020 per pts request.

## 2020-08-29 ENCOUNTER — Other Ambulatory Visit: Payer: Self-pay

## 2020-08-29 ENCOUNTER — Ambulatory Visit (HOSPITAL_COMMUNITY)
Admission: RE | Admit: 2020-08-29 | Discharge: 2020-08-29 | Disposition: A | Discharge status: Home / Self Care | Payer: Medicare HMO | Source: Ambulatory Visit | Attending: Nurse Practitioner | Admitting: Nurse Practitioner

## 2020-08-29 DIAGNOSIS — E039 Hypothyroidism, unspecified: Secondary | ICD-10-CM | POA: Diagnosis not present

## 2020-09-11 ENCOUNTER — Emergency Department (HOSPITAL_COMMUNITY): Payer: Medicare HMO

## 2020-09-11 ENCOUNTER — Emergency Department (HOSPITAL_COMMUNITY)
Admission: EM | Admit: 2020-09-11 | Discharge: 2020-09-11 | Disposition: A | Discharge status: Home / Self Care | Payer: Medicare HMO | Attending: Emergency Medicine | Admitting: Emergency Medicine

## 2020-09-11 ENCOUNTER — Encounter (HOSPITAL_COMMUNITY): Payer: Self-pay | Admitting: *Deleted

## 2020-09-11 ENCOUNTER — Other Ambulatory Visit: Payer: Self-pay

## 2020-09-11 DIAGNOSIS — Z79899 Other long term (current) drug therapy: Secondary | ICD-10-CM | POA: Insufficient documentation

## 2020-09-11 DIAGNOSIS — E039 Hypothyroidism, unspecified: Secondary | ICD-10-CM | POA: Diagnosis not present

## 2020-09-11 DIAGNOSIS — I10 Essential (primary) hypertension: Secondary | ICD-10-CM | POA: Insufficient documentation

## 2020-09-11 DIAGNOSIS — R531 Weakness: Secondary | ICD-10-CM | POA: Diagnosis not present

## 2020-09-11 DIAGNOSIS — R0600 Dyspnea, unspecified: Secondary | ICD-10-CM | POA: Insufficient documentation

## 2020-09-11 DIAGNOSIS — Z7982 Long term (current) use of aspirin: Secondary | ICD-10-CM | POA: Insufficient documentation

## 2020-09-11 DIAGNOSIS — Z87891 Personal history of nicotine dependence: Secondary | ICD-10-CM | POA: Diagnosis not present

## 2020-09-11 DIAGNOSIS — J45909 Unspecified asthma, uncomplicated: Secondary | ICD-10-CM | POA: Diagnosis not present

## 2020-09-11 DIAGNOSIS — R0602 Shortness of breath: Secondary | ICD-10-CM | POA: Diagnosis not present

## 2020-09-11 LAB — CBC
HCT: 37 % (ref 36.0–46.0)
Hemoglobin: 12.2 g/dL (ref 12.0–15.0)
MCH: 31.9 pg (ref 26.0–34.0)
MCHC: 33 g/dL (ref 30.0–36.0)
MCV: 96.9 fL (ref 80.0–100.0)
Platelets: 220 10*3/uL (ref 150–400)
RBC: 3.82 MIL/uL — ABNORMAL LOW (ref 3.87–5.11)
RDW: 14.7 % (ref 11.5–15.5)
WBC: 9.8 10*3/uL (ref 4.0–10.5)
nRBC: 0 % (ref 0.0–0.2)

## 2020-09-11 LAB — TRIGLYCERIDES: Triglycerides: 92 mg/dL (ref <150)

## 2020-09-11 LAB — BASIC METABOLIC PANEL
Anion gap: 10 (ref 5–15)
BUN: 14 mg/dL (ref 8–23)
CO2: 26 mmol/L (ref 22–32)
Calcium: 9.1 mg/dL (ref 8.9–10.3)
Chloride: 103 mmol/L (ref 98–111)
Creatinine, Ser: 0.76 mg/dL (ref 0.44–1.00)
GFR, Estimated: >60 mL/min (ref >60)
Glucose, Bld: 99 mg/dL (ref 70–99)
Potassium: 3.8 mmol/L (ref 3.5–5.1)
Sodium: 139 mmol/L (ref 135–145)

## 2020-09-11 LAB — TROPONIN I (HIGH SENSITIVITY)
Troponin I (High Sensitivity): 4 ng/L (ref <18)
Troponin I (High Sensitivity): 4 ng/L (ref <18)

## 2020-09-11 LAB — BRAIN NATRIURETIC PEPTIDE: B Natriuretic Peptide: 64 pg/mL (ref 0.0–100.0)

## 2020-09-11 NOTE — Discharge Instructions (Addendum)
At this time there does not appear to be the presence of an emergent medical condition, however there is always the potential for conditions to change. Please read and follow the below instructions.  Please return to the Emergency Department immediately for any new or worsening symptoms. Please be sure to follow up with your Primary Care Provider within one week regarding your visit today; please call their office to schedule an appointment even if you are feeling better for a follow-up visit. Keep a logbook of your blood pressures and discuss them with your primary care doctor at your follow-up appointment this week.  Go to the nearest Emergency Department immediately if: You have fever or chills Get a very bad headache. Start to feel mixed up (confused). Feel weak or numb. Feel faint. Have very bad pain in your: Chest. Belly (abdomen). Throw up more than once. Have trouble breathing. You feel confused. Your vision is blurry. You feel faint or you pass out. You have a severe headache. You have severe pain in your abdomen, your back, or the area between your waist and hips (pelvis). You have chest pain, shortness of breath, or an irregular or fast heartbeat. You have any new/concerning or worsening of symptoms  Please read the additional information packets attached to your discharge summary.  Do not take your medicine if  develop an itchy rash, swelling in your mouth or lips, or difficulty breathing; call 911 and seek immediate emergency medical attention if this occurs.  You may review your lab tests and imaging results in their entirety on your MyChart account.  Please discuss all results of fully with your primary care provider and other specialist at your follow-up visit.  Note: Portions of this text may have been transcribed using voice recognition software. Every effort was made to ensure accuracy; however, inadvertent computerized transcription errors may still be present.

## 2020-09-11 NOTE — ED Notes (Signed)
Pt ambulated around desk. O2 97-99% pt states no Shortness of breath

## 2020-09-11 NOTE — ED Triage Notes (Signed)
States she became weak while providing care for her husband today

## 2020-09-11 NOTE — ED Provider Notes (Signed)
Medical screening examination/treatment/procedure(s) were conducted as a shared visit with non-physician practitioner(s) and myself.  I personally evaluated the patient during the encounter.  EKG Interpretation  Date/Time:  Monday September 11 2020 12:50:49 EDT Ventricular Rate:  72 PR Interval:  180 QRS Duration: 86 QT Interval:  406 QTC Calculation: 444 R Axis:   -36 Text Interpretation: Sinus rhythm with marked sinus arrhythmia Left axis deviation Anterior infarct , age undetermined Abnormal ECG Since last tracing Sinus arrhythmia NOW PRESENT Confirmed by Calvert Cantor [phone]) on 09/11/2020 1:06:25 PM   Patient seen by me along with physician assistant.  Patient brought to the emergency department by family member.  She arrived here at about 1:00 in the afternoon.  She had become weak while providing care for her husband today.  She also checked her blood pressure and it was elevated.  Which worried her.  She felt as if she was maybe having some wheezing she used albuterol and she also took a Xanax.  And then started to feel better.  Upon arrival here she was not doing any wheezing.  Labs troponins normal CBC normal basic metabolic panel without any acute abnormalities.  Troponins x2 were normal.  BNP was not elevated.  Chest x-ray doubt any acute findings.  EKG showed just some sinus arrhythmia but no other acute changes.  Currently ambulating fine without any shortness of breath.  Feels good.  Her blood pressure has improved some here she arrived at 165/100.  At is currently 146/78 oxygen saturation on room air 100%.  No tachypnea no tachycardia.  No fever.  Mended patient keeps a log of her blood pressure and follow back up with primary care doctor in case today was a new trend for the blood pressure being a little higher.  Past medical history significant for high blood pressure thyroid disease GERD asthma.  Patient currently in no acute distress.  Patient feeling comfortable.  Patient  stable for discharge home follow-up with primary care doctor returning for any new or worse symptoms.  Patient ambulating fine here.   Fredia Sorrow, MD 09/11/20 [id number]

## 2020-09-11 NOTE — ED Provider Notes (Signed)
[facility] EMERGENCY DEPARTMENT Provider Note   CSN: [id number] Arrival date & time: 09/11/20  1234     History Chief Complaint  Patient presents with  . Fatigue    [name] is a 79 y.o. female history of GERD, hypertension, arthritis, asthma.  Patient presents today for shortness of breath.  She reports around 9 AM this morning she was helping her husband at home.  Her husband has dementia and she is the primary caregiver.  She was helping him move around, changing the sheets and performing other tasks when she had some difficulty breathing.  She reports shortness of breath, she reports difficulty catching her breath this lasted about an hour, she took a Xanax and used her albuterol inhaler with relief of her symptoms.  She called her primary care doctor today and they advised her to come to the ER for evaluation.  Patient reports that she checked her blood pressure during this event of shortness of breath and noted her systolic blood pressure to be around 170.  She reports compliance with her daily meds.  She denies fall/injury, headache, chest pain, cough/hemoptysis, abdominal pain, diaphoresis, numbness, unilateral weakness, nausea/vomiting, diarrhea or any additional concerns.  HPI     Past Medical History:  Diagnosis Date  . Anemia   . Anxiety   . Arthritis   . Asthma   . Constipation   . GERD (gastroesophageal reflux disease)   . High blood pressure   . Hypothyroidism   . Seasonal allergies   . Thyroid disease   . Varicose veins   . Vertigo 11/23/2013    Patient Active Problem List   Diagnosis Date Noted  . Loss of weight 08/23/2020  . Nausea without vomiting 06/15/2020  . Tick bite 04/10/2020  . Medication refill 04/10/2020  . Acute maxillary sinusitis 08/20/2019  . Cellulitis, leg 08/10/2019  . Cellulitis 08/08/2019  . Fever 08/08/2019  . Cellulitis of foot 08/08/2019  . Hypokalemia 08/08/2019  . Hyponatremia 08/08/2019  . Essential  hypertension 08/08/2019  . 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  . Adverse food reaction 09/24/2018  . Mild persistent asthma without complication 20/25/4270  . Pain of left hip joint 07/17/2018  . Dyspnea on exertion 01/24/2017  . Cough variant asthma  vs UACS 01/24/2017  . High risk medication use 09/25/2016  . Varicose veins of lower extremities with other complications 62/37/6283  . Varicose veins 02/15/2014  . Vertigo 11/23/2013  . Pain in limb 10/04/2013  . Hip bursitis 02/11/2013  . Constipation 02/16/2009  . RECTAL BLEEDING 02/16/2009  . HIGH BLOOD PRESSURE 01/29/2008    Past Surgical History:  Procedure Laterality Date  . APPENDECTOMY    . CATARACT EXTRACTION W/PHACO Left 01/27/2017   Procedure: CATARACT EXTRACTION PHACO AND INTRAOCULAR LENS PLACEMENT (IOC);  Surgeon: Tonny Branch, MD;  Location: [facility];  Service: Ophthalmology;  Laterality: Left;  CDE: 29.90  . CATARACT EXTRACTION W/PHACO Right 02/24/2017   Procedure: CATARACT EXTRACTION PHACO AND INTRAOCULAR LENS PLACEMENT (IOC);  Surgeon: Tonny Branch, MD;  Location: [facility];  Service: Ophthalmology;  Laterality: Right;  CDE: 8.57  . CHOLECYSTECTOMY    . COLONOSCOPY N/A 10/01/2016   Procedure: COLONOSCOPY;  Surgeon: Daneil Dolin, MD; pancolonic diverticulosis, melanosis coli, otherwise normal exam.  No recommendations to repeat colonoscopy.  Marland Kitchen FLEXOR TENDON REPAIR Right 11/06/2017   Procedure: RIGHT WRIST FLEXOR TENDON REPAIRAND STT [place];  Surgeon: Leanora Cover, MD;  Location: [place];  Service: Orthopedics;  Laterality: Right;  . TUBAL  LIGATION       OB History    Gravida  2   Para  2   Term      Preterm      AB      Living  2     SAB      TAB      Ectopic      Multiple      Live Births  2           Family History  Problem Relation Age of Onset  . Hypertension Mother   . Varicose Veins Father   . Heart attack Father   . Heart disease Father   . Hypertension Brother     . COPD Brother     Social History   Tobacco Use  . Smoking status: Former Smoker    Packs/day: 0.25    Years: 5.00    Pack years: 1.25    Types: Cigarettes    Quit date: 12/02/1997    Years since quitting: 22.7  . Smokeless tobacco: Never Used  Vaping Use  . Vaping Use: Never used  Substance Use Topics  . Alcohol use: Yes    Alcohol/week: 1.0 standard drink    Types: 1 Glasses of wine per week    Comment: occasional wine  . Drug use: No    Home Medications Prior to Admission medications   Medication Sig Start Date End Date Taking? Authorizing Provider  albuterol (VENTOLIN HFA) 108 (90 Base) MCG/ACT inhaler Inhale 2 puffs into the lungs every 6 (six) hours as needed for wheezing or shortness of breath.    [provider]  ALPRAZolam Duanne Moron) 0.5 MG tablet Take 0.5 mg by mouth 3 (three) times daily as needed for anxiety.     [provider]  amLODipine (NORVASC) 2.5 MG tablet Take 2.5 mg by mouth every evening.  02/09/13   [provider]  aspirin EC 81 MG tablet Take 81 mg by mouth daily.    [provider]  chlorhexidine (PERIDEX) 0.12 % solution 2 (two) times daily. 07/24/20   [provider]  Docusate Sodium (STOOL SOFTENER LAXATIVE PO) Take by mouth daily. Takes 2 daily    [provider]  EPINEPHrine 0.3 mg/0.3 mL IJ SOAJ injection Inject 0.3 mLs (0.3 mg total) into the muscle once as needed for anaphylaxis. 12/30/18   Valentina Shaggy, MD  fluticasone [facility]) 50 MCG/ACT nasal spray Place 2 sprays into both nostrils as needed.     [name], MD  levothyroxine (SYNTHROID) 50 MCG tablet Take 1 tablet (50 mcg total) by mouth daily. 08/02/20   Brita Romp, NP  losartan (COZAAR) 50 MG tablet Take 25 mg by mouth daily.     [provider]  MAGNESIUM PO Take 500 mg by mouth daily. Patient not taking: Reported on 08/02/2020    [provider]  Multiple Vitamin (MULTIVITAMIN) tablet Take 1 tablet by  mouth daily.    [provider]  ondansetron (ZOFRAN) 4 MG tablet Take 4 mg by mouth every 6 (six) hours as needed. 06/12/20   [provider]  pantoprazole (PROTONIX) 40 MG tablet Take 1 tablet (40 mg total) by mouth 2 (two) times daily before a meal. 06/15/20   Jodi Mourning, Tivis Ringer, PA-C  polyethylene glycol (MIRALAX / GLYCOLAX) 17 g packet Take 17 g by mouth daily.    [provider]  raloxifene (EVISTA) 60 MG tablet Take 1 tablet (60 mg total) by mouth  every morning. Patient taking differently: Take 60 mg by mouth every other day.  04/10/20   Estill Dooms, NP  SYMBICORT 80-4.5 MCG/ACT inhaler INHALE 2 PUFFS INTO THE LUNGS TWICE DAILY. Patient taking differently: Inhale 2 puffs into the lungs daily. QD am 02/02/18   Tanda Rockers, MD  traZODone (DESYREL) 100 MG tablet Take 50-100 mg by mouth at bedtime. 07/25/20   [provider]  triamterene-hydrochlorothiazide (MAXZIDE-25) 37.5-25 MG tablet Take 1 tablet by mouth as needed.  08/02/19   [provider]  venlafaxine XR (EFFEXOR-XR) 37.5 MG 24 hr capsule Take 37.5 mg by mouth at bedtime. 07/10/20   [provider]    Allergies    Amoxicillin-pot clavulanate, Ace inhibitors, Entex la, Levofloxacin, and Meloxicam  Review of Systems   Review of Systems Ten systems are reviewed and are negative for acute change except as noted in the HPI  Physical Exam Updated Vital Signs BP (!) 146/78 (BP Location: Right Arm)   Pulse 72   Temp 97.7 F (36.5 C) (Oral)   Resp 20   SpO2 100%   Physical Exam Constitutional:      General: She is not in acute distress.    Appearance: Normal appearance. She is well-developed. She is not ill-appearing or diaphoretic.  HENT:     Head: Normocephalic and atraumatic.  Eyes:     General: Vision grossly intact. Gaze aligned appropriately.     Pupils: Pupils are equal, round, and reactive to light.  Neck:     Trachea: Trachea and phonation normal.    Cardiovascular:     Rate and Rhythm: Normal rate and regular rhythm.     Pulses: Normal pulses.  Pulmonary:     Effort: Pulmonary effort is normal. No respiratory distress.     Breath sounds: Normal breath sounds.  Abdominal:     General: There is no distension.     Palpations: Abdomen is soft.     Tenderness: There is no abdominal tenderness. There is no guarding or rebound.  Musculoskeletal:        General: Normal range of motion.     Cervical back: Normal range of motion.     Comments: Trace bilateral lower extremity edema which patient reports is baseline.  Skin:    General: Skin is warm and dry.  Neurological:     General: No focal deficit present.     Mental Status: She is alert.     GCS: GCS eye subscore is 4. GCS verbal subscore is 5. GCS motor subscore is 6.     Comments: Speech is clear and goal oriented, follows commands Major Cranial nerves without deficit, no facial droop Normal strength in upper and lower extremities bilaterally including dorsiflexion and plantar flexion, strong and equal grip strength Sensation normal to light and sharp touch Moves extremities without ataxia, coordination intact Normal finger to nose and rapid alternating movements No pronator drift  Psychiatric:        Behavior: Behavior normal.    ED Results / Procedures / Treatments   Labs (all labs ordered are listed, but only abnormal results are displayed) Labs Reviewed  CBC - Abnormal; Notable for the following components:      Result Value   RBC 3.82 (*)    All other components within normal limits  BASIC METABOLIC PANEL  TRIGLYCERIDES  BRAIN NATRIURETIC PEPTIDE  TROPONIN I (HIGH SENSITIVITY)  TROPONIN I (HIGH SENSITIVITY)    EKG EKG Interpretation  Date/Time:  Monday September 11 2020 12:50:49 EDT Ventricular Rate:  72 PR Interval:  180 QRS Duration: 86 QT Interval:  406 QTC Calculation: 444 R Axis:   -36 Text Interpretation: Sinus rhythm with marked sinus arrhythmia  Left axis deviation Anterior infarct , age undetermined Abnormal ECG Since last tracing Sinus arrhythmia NOW PRESENT Confirmed by Calvert Cantor [phone]) on 09/11/2020 1:06:25 PM   Radiology DG Chest 2 View  Result Date: 09/11/2020 CLINICAL DATA:  Weakness. EXAM: CHEST - 2 VIEW COMPARISON:  August 08, 2019. FINDINGS: The heart size and mediastinal contours are within normal limits. Both lungs are clear. The visualized skeletal structures are unremarkable. IMPRESSION: No active cardiopulmonary disease. Electronically Signed   By: Marijo Conception M.D.   On: 09/11/2020 13:23    Procedures Procedures (including critical care time)  Medications Ordered in ED Medications - No data to display  ED Course  I have reviewed the triage vital signs and the nursing notes.  Pertinent labs & imaging results that were available during my care of the patient were reviewed by me and considered in my medical decision making (see chart for details).    MDM Rules/Calculators/A&P                         Additional history obtained from: 1. Nursing notes from this visit. 2. Electronic medical record review. -------------------------- I ordered, reviewed and interpreted labs which include: CBC shows no anemia or leukocytosis. BMP within normal limits, no emergent electrolyte derangement, AKI or gap. BNP within normal limits, patient does not appear fluid overloaded. High-sensitivity troponin within normal limits x2, 4 -> 4 Triglycerides are within normal limits, unclear why this was ordered in triage.  CXR:  IMPRESSION:  No active cardiopulmonary disease.   EKG: Sinus rhythm with marked sinus arrhythmia Left axis deviation Anterior infarct , age undetermined Abnormal ECG Since last tracing Sinus arrhythmia NOW PRESENT Confirmed by Calvert Cantor [phone]) on 09/11/2020 1:06:25 PM - Patient observed in the ER for multiple hours, no recurrence of symptoms.  She was ambulated without hypoxia or  shortness of breath.  She is requesting discharge.  Possible patient's shortness of breath earlier was secondary to her asthma as it did resolve with albuterol, she has no evidence of acute asthma exacerbation at this time.  Additionally she did take Xanax earlier which improved her symptoms as well, possible component of anxiety.  She has no chest pain, tachycardia, hypoxia or pleurisy to suggest pulmonary embolism.  Low suspicion for ACS, dissection or other acute cardiopulmonary etiology of her symptoms today.  Patient seen and evaluated by Dr. Rogene Houston during this visit who agrees with discharge and PCP follow-up.  At this time there does not appear to be any evidence of an acute emergency medical condition and the patient appears stable for discharge with appropriate outpatient follow up. Diagnosis was discussed with patient who verbalizes understanding of care plan and is agreeable to discharge. I have discussed return precautions with patient who verbalizes understanding. Patient encouraged to follow-up with their PCP. All questions answered.   Note: Portions of this report may have been transcribed using voice recognition software. Every effort was made to ensure accuracy; however, inadvertent computerized transcription errors may still be present. Final Clinical Impression(s) / ED Diagnoses Final diagnoses:  Dyspnea, unspecified type    Rx / DC Orders ED Discharge Orders    None       Gari Crown 09/11/20 2328    Fredia Sorrow, MD  09/11/20 2354  

## 2020-09-12 ENCOUNTER — Other Ambulatory Visit: Payer: Self-pay

## 2020-09-12 NOTE — Patient Outreach (Signed)
[place] [facility]) Care Management  09/12/2020  DOLORIS SERVANTES 05/28/1941 [id number]   Telephone call to patient for disease management follow up. Patient states not a good time.  She asked that CM call another time.   Plan: RN CM will attempt patient again in the month of October.    Jone Baseman, RN, MSN [place] [facility] Care Management Coordinator Direct Line [phone] Cell [phone] Toll Free: [phone]  Fax: [phone]

## 2020-09-14 ENCOUNTER — Telehealth: Payer: Self-pay

## 2020-09-14 NOTE — Telephone Encounter (Signed)
Pt called to discuss the medication she was placed on. Pt is taking Pantoprazole 40 mg bid and Miralax qpm as directed. Pt feels her BMs aren't large enough. Pt use to have more productive BM before she started the Miralax. Pt states she will have a BM in the mornings but it's only the size of her 5th finger. Pt states after her first BM, she feels nauseous and then has a second BM. Pt read the bottle of Miralax and said you're not suppose to stay on this medication no more than 2 weeks. Pt wants to know if it's ok to continue to take these medications until she comes back in December? Pt wanted to let Aliene Altes, PA know that she is taking both medications. Pt isn't sure if she needs to have her upper endoscopy prior to coming back. Pt was holding off until she got her thyroid problems together.

## 2020-09-14 NOTE — Telephone Encounter (Signed)
If she does not feel MiraLAX is working well, we can try a prescriptive medication for constipation.  We have samples of Linzess.  Would like to try starting at the low-dose of Linzess 72 mcg daily.  We can provide her with samples to try and if this works well, we will send in a prescription.  When she picks up the samples, she should call in 1-2 weeks to let us know how it is working.  Please make sure patient is aware to take Linzess at least 30 minutes before her first meal of the day and that Linzess can cause diarrhea initially, but this should improve after a few days.    In regards to TCS/EGD, as I stated on prior telephone note 08/24/2020, I am not sure her thyroid is causing all of her weight loss, and I do feel she needs procedure to evaluate her GI tract.  If she would like to go ahead and reschedule these, we can definitely do that.  We can move her follow-up appointment to after her procedures.

## 2020-09-15 NOTE — Telephone Encounter (Signed)
Spoke with pt. Pt was notified of recommendations of starting Linzess 72 mcg if pt feels the Miralax isn't helping her. Pt is going to think about the Linzess and let Aliene Altes, PA know if she wants to try it. Pt states she feels the Elesa Hacker does help but wanted to mention how her stools were. Pt also is going to think a little longer on when she wants to have her procedures done and will call back.

## 2020-09-15 NOTE — Telephone Encounter (Signed)
Correction, Miralax.

## 2020-09-18 DIAGNOSIS — F419 Anxiety disorder, unspecified: Secondary | ICD-10-CM | POA: Diagnosis not present

## 2020-09-18 DIAGNOSIS — I10 Essential (primary) hypertension: Secondary | ICD-10-CM | POA: Diagnosis not present

## 2020-09-20 ENCOUNTER — Other Ambulatory Visit: Payer: Self-pay

## 2020-09-20 ENCOUNTER — Other Ambulatory Visit (HOSPITAL_COMMUNITY): Admission: RE | Admit: 2020-09-20 | Payer: Medicare HMO | Source: Ambulatory Visit

## 2020-09-20 DIAGNOSIS — L859 Epidermal thickening, unspecified: Secondary | ICD-10-CM | POA: Diagnosis not present

## 2020-09-20 DIAGNOSIS — D485 Neoplasm of uncertain behavior of skin: Secondary | ICD-10-CM | POA: Diagnosis not present

## 2020-09-20 NOTE — Patient Outreach (Signed)
[facility] [facility]) Care Management  09/20/2020  WAVERLEY KREMPASKY [birth year] [id number]   Telephone call to patient for disease management follow up.   No answer.  HIPAA compliant voice message left.    Plan: If no return call, RN CM will attempt patient again in the month of December and send letter.  Jone Baseman, RN, MSN [place] Management Care Management Coordinator Direct Line [phone] Cell [phone] Toll Free: [phone]  Fax: [phone]

## 2020-09-21 ENCOUNTER — Ambulatory Visit (HOSPITAL_COMMUNITY): Admit: 2020-09-21 | Payer: Medicare HMO | Admitting: Internal Medicine

## 2020-09-21 ENCOUNTER — Encounter (HOSPITAL_COMMUNITY): Payer: Self-pay

## 2020-09-21 SURGERY — COLONOSCOPY WITH PROPOFOL
Anesthesia: Monitor Anesthesia Care

## 2020-09-22 ENCOUNTER — Ambulatory Visit: Payer: Self-pay

## 2020-09-27 DIAGNOSIS — R69 Illness, unspecified: Secondary | ICD-10-CM | POA: Diagnosis not present

## 2020-09-28 ENCOUNTER — Other Ambulatory Visit: Payer: Self-pay | Admitting: Nurse Practitioner

## 2020-09-28 ENCOUNTER — Ambulatory Visit: Payer: Self-pay | Admitting: *Deleted

## 2020-09-28 ENCOUNTER — Ambulatory Visit: Payer: Medicare HMO | Attending: Internal Medicine

## 2020-09-28 DIAGNOSIS — E039 Hypothyroidism, unspecified: Secondary | ICD-10-CM | POA: Diagnosis not present

## 2020-09-28 DIAGNOSIS — Z23 Encounter for immunization: Secondary | ICD-10-CM

## 2020-09-28 NOTE — Telephone Encounter (Signed)
Caller asking questions regarding possible side effects of the 3rd Pfizer booster vaccine. Patient reports no reactions to her 1st 2 Pfizer vaccinations. Reviewed possible reaction symptoms. Patient aware she can also recieve flu vaccine as well. Care advise given. Patient verbalized understanding of care advise and to call back if needed.   Reason for Disposition . COVID-19 vaccine, Frequently Asked Questions (FAQs)  Answer Assessment - Initial Assessment Questions 1. MAIN CONCERN OR SYMPTOM:  "What is your main concern right now?" "What question do you have?" "What's the main symptom you're worried about?" (e.g., fever, pain, redness, swelling)     Can I get a reaction from the 3rd booster shot? 2. VACCINE: "What vaccination did you receive?" "Is this your first or second shot?" (e.g., none; AstraZeneca, J&J, [place], [employer], other)     [place]  6. PAST REACTIONS: "Have you reacted to immunizations before?" If Yes, ask: "What happened?"     No  Protocols used: CORONAVIRUS (COVID-19) VACCINE QUESTIONS AND REACTIONS-A-AH

## 2020-09-28 NOTE — Progress Notes (Signed)
   Covid-19 Vaccination Clinic  Name:  TWANISHA FOULK    MRN: [id number] DOB: [birth year]  09/28/2020  Ms. Gamarra was observed post Covid-19 immunization for   Covid-19 Vaccination Clinic  Name:  HAILEE HOLLICK    MRN: [id number] DOB: 09-02-1941  09/28/2020  Ms. Maund was observed post Covid-19 immunization for 15 minutes without incident. She was provided with Vaccine Information Sheet and instruction to access the V-Safe system.   Ms. Perfetti was instructed to call 911 with any severe reactions post vaccine: Marland Kitchen Difficulty breathing  . Swelling of face and throat  . A fast heartbeat  . A bad rash all over body  . Dizziness and weakness       Covid-19 Vaccination Clinic  Name:  CORINA STACY    MRN: [id number] DOB: 1941/02/05  09/28/2020  Ms. Degracia was observed post Covid-19 immunization for 15 minutes without incident. She was provided with Vaccine Information Sheet and instruction to access the V-Safe system.   Ms. Rhein was instructed to call 911 with any severe reactions post vaccine: Marland Kitchen Difficulty breathing  . Swelling of face and throat  . A fast heartbeat  . A bad rash all over body  . Dizziness and weakness     without incident. She was provided with Vaccine Information Sheet and instruction to access the V-Safe system.   Ms. Streicher was instructed to call 911 with any severe reactions post vaccine: Marland Kitchen Difficulty breathing  . Swelling of face and throat  . A fast heartbeat  . A bad rash all over body  . Dizziness and weakness

## 2020-09-29 LAB — TSH: TSH: 2.39 mIU/L (ref 0.40–4.50)

## 2020-09-29 LAB — THYROGLOBULIN ANTIBODY: Thyroglobulin Ab: <1 IU/mL (ref <=1)

## 2020-09-29 LAB — THYROID PEROXIDASE ANTIBODY: Thyroperoxidase Ab SerPl-aCnc: 2 IU/mL (ref <9)

## 2020-09-29 LAB — T4, FREE: Free T4: 1.4 ng/dL (ref 0.8–1.8)

## 2020-09-29 LAB — T3, FREE: T3, Free: 2.6 pg/mL (ref 2.3–4.2)

## 2020-10-03 DIAGNOSIS — R69 Illness, unspecified: Secondary | ICD-10-CM | POA: Diagnosis not present

## 2020-10-04 ENCOUNTER — Ambulatory Visit: Payer: Medicare HMO | Admitting: Nurse Practitioner

## 2020-10-04 DIAGNOSIS — K006 Disturbances in tooth eruption: Secondary | ICD-10-CM | POA: Diagnosis not present

## 2020-10-04 DIAGNOSIS — R69 Illness, unspecified: Secondary | ICD-10-CM | POA: Diagnosis not present

## 2020-10-06 ENCOUNTER — Telehealth (INDEPENDENT_AMBULATORY_CARE_PROVIDER_SITE_OTHER): Payer: Medicare HMO | Admitting: Nurse Practitioner

## 2020-10-06 ENCOUNTER — Encounter: Payer: Self-pay | Admitting: Nurse Practitioner

## 2020-10-06 VITALS — Wt 137.0 lb

## 2020-10-06 DIAGNOSIS — E039 Hypothyroidism, unspecified: Secondary | ICD-10-CM

## 2020-10-06 NOTE — Patient Instructions (Signed)

## 2020-10-06 NOTE — Progress Notes (Signed)
Endocrinology Follow Up Visit                                        10/06/2020, 11:02 AM    TELEHEALTH VISIT: The patient is being engaged in telehealth visit due to COVID-19.  This type of visit limits physical examination significantly, and thus is not preferable over face-to-face encounters.  I connected with  [name] on 10/06/20 by a video enabled telemedicine application and verified that I am speaking with the correct person using two identifiers.   I discussed the limitations of evaluation and management by telemedicine. The patient expressed understanding and agreed to proceed.    The participants involved in this visit include: Brita Romp, NP located at [facility] and [name]  located at their personal residence listed.  SUBJECTIVE:  [name] is a 79 y.o.-year-old female patient being seen in follow up after being seen in consultation for hypothyroidism referred by [name], MD.   Past Medical History:  Diagnosis Date  . Anemia   . Anxiety   . Arthritis   . Asthma   . Constipation   . GERD (gastroesophageal reflux disease)   . High blood pressure   . Hypothyroidism   . Seasonal allergies   . Thyroid disease   . Varicose veins   . Vertigo 11/23/2013    Past Surgical History:  Procedure Laterality Date  . APPENDECTOMY    . CATARACT EXTRACTION W/PHACO Left 01/27/2017   Procedure: CATARACT EXTRACTION PHACO AND INTRAOCULAR LENS PLACEMENT (IOC);  Surgeon: Tonny Branch, MD;  Location: [facility];  Service: Ophthalmology;  Laterality: Left;  CDE: 29.90  . CATARACT EXTRACTION W/PHACO Right 02/24/2017   Procedure: CATARACT EXTRACTION PHACO AND INTRAOCULAR LENS PLACEMENT (IOC);  Surgeon: Tonny Branch, MD;  Location: [facility];  Service: Ophthalmology;  Laterality: Right;  CDE: 8.57  . CHOLECYSTECTOMY    . COLONOSCOPY N/A 10/01/2016   Procedure: COLONOSCOPY;   Surgeon: Daneil Dolin, MD; pancolonic diverticulosis, melanosis coli, otherwise normal exam.  No recommendations to repeat colonoscopy.  Marland Kitchen FLEXOR TENDON REPAIR Right 11/06/2017   Procedure: RIGHT WRIST FLEXOR TENDON REPAIRAND STT [place];  Surgeon: Leanora Cover, MD;  Location: [place];  Service: Orthopedics;  Laterality: Right;  . TUBAL LIGATION      Social History   Socioeconomic History  . Marital status: Married    Spouse name: Not on file  . Number of children: Not on file  . Years of education: Not on file  . Highest education level: Not on file  Occupational History  . Not on file  Tobacco Use  . Smoking status: Former Smoker    Packs/day: 0.25    Years: 5.00    Pack years: 1.25    Types: Cigarettes    Quit date: 12/02/1997    Years since quitting: 22.8  . Smokeless tobacco: Never Used  Vaping Use  . Vaping Use: Never used  Substance and Sexual Activity  . Alcohol use: Yes    Alcohol/week: 1.0  standard drink    Types: 1 Glasses of wine per week    Comment: occasional wine  . Drug use: No  . Sexual activity: Yes    Birth control/protection: Surgical    Comment: tubal  Other Topics Concern  . Not on file  Social History Narrative  . Not on file   Social Determinants of Health   Financial Resource Strain: Low Risk   . Difficulty of Paying Living Expenses: Not hard at all  Food Insecurity: No Food Insecurity  . Worried About Charity fundraiser in the Last Year: Never true  . Ran Out of Food in the Last Year: Never true  Transportation Needs: No Transportation Needs  . Lack of Transportation (Medical): No  . Lack of Transportation (Non-Medical): No  Physical Activity: Inactive  . Days of Exercise per Week: 0 days  . Minutes of Exercise per Session: 0 min  Stress: Stress Concern Present  . Feeling of Stress : To some extent  Social Connections: Socially Integrated  . Frequency of Communication with Friends and Family: More than three  times a week  . Frequency of Social Gatherings with Friends and Family: More than three times a week  . Attends Religious Services: More than 4 times per year  . Active Member of Clubs or Organizations: Yes  . Attends Archivist Meetings: More than 4 times per year  . Marital Status: Married    Family History  Problem Relation Age of Onset  . Hypertension Mother   . Varicose Veins Father   . Heart attack Father   . Heart disease Father   . Hypertension Brother   . COPD Brother     Outpatient Encounter Medications as of 10/06/2020  Medication Sig  . albuterol (VENTOLIN HFA) 108 (90 Base) MCG/ACT inhaler Inhale 2 puffs into the lungs every 6 (six) hours as needed for wheezing or shortness of breath.  . ALPRAZolam (XANAX) 0.5 MG tablet Take 0.5 mg by mouth 3 (three) times daily as needed for anxiety.   Marland Kitchen amLODipine (NORVASC) 2.5 MG tablet Take 2.5 mg by mouth every evening.   Marland Kitchen amoxicillin (AMOXIL) 500 MG capsule Take 500 mg by mouth 3 (three) times daily.  Marland Kitchen aspirin EC 81 MG tablet Take 81 mg by mouth daily.  . chlorhexidine (PERIDEX) 0.12 % solution 2 (two) times daily.  . fluocinonide gel (LIDEX) 0.05 % Apply topically 3 (three) times daily.  . fluticasone (FLONASE) 50 MCG/ACT nasal spray Place 2 sprays into both nostrils as needed.   . gabapentin (NEURONTIN) 100 MG capsule Take 100-300 mg by mouth at bedtime.  Marland Kitchen HYDROcodone-acetaminophen (NORCO/VICODIN) 5-325 MG tablet Take 1 tablet by mouth every 4 (four) hours as needed.  Marland Kitchen levothyroxine (SYNTHROID) 50 MCG tablet Take 1 tablet (50 mcg total) by mouth daily.  Marland Kitchen losartan (COZAAR) 25 MG tablet Take 25 mg by mouth daily.  . Multiple Vitamin (MULTIVITAMIN) tablet Take 1 tablet by mouth daily.  . mupirocin ointment (BACTROBAN) 2 % Apply topically 3 (three) times daily.  . pantoprazole (PROTONIX) 40 MG tablet Take 1 tablet (40 mg total) by mouth 2 (two) times daily before a meal.  . polyethylene glycol (MIRALAX / GLYCOLAX) 17  g packet Take 17 g by mouth daily.  Marland Kitchen PREVIDENT 5000 BOOSTER PLUS 1.1 % PSTE   . raloxifene (EVISTA) 60 MG tablet Take 1 tablet (60 mg total) by mouth every morning. (Patient taking differently: Take 60 mg by mouth every other day. )  .  SYMBICORT 80-4.5 MCG/ACT inhaler INHALE 2 PUFFS INTO THE LUNGS TWICE DAILY. (Patient taking differently: Inhale 2 puffs into the lungs daily. QD am)  . venlafaxine XR (EFFEXOR-XR) 37.5 MG 24 hr capsule Take 37.5 mg by mouth at bedtime.  . [DISCONTINUED] Docusate Sodium (STOOL SOFTENER LAXATIVE PO) Take by mouth daily. Takes 2 daily  . [DISCONTINUED] losartan (COZAAR) 50 MG tablet Take 25 mg by mouth daily.   . [DISCONTINUED] EPINEPHrine 0.3 mg/0.3 mL IJ SOAJ injection Inject 0.3 mLs (0.3 mg total) into the muscle once as needed for anaphylaxis.  . [DISCONTINUED] MAGNESIUM PO Take 500 mg by mouth daily. (Patient not taking: Reported on 08/02/2020)  . [DISCONTINUED] ondansetron (ZOFRAN) 4 MG tablet Take 4 mg by mouth every 6 (six) hours as needed.  . [DISCONTINUED] traZODone (DESYREL) 100 MG tablet Take 50-100 mg by mouth at bedtime.  . [DISCONTINUED] triamterene-hydrochlorothiazide (MAXZIDE-25) 37.5-25 MG tablet Take 1 tablet by mouth as needed.    No facility-administered encounter medications on file as of 10/06/2020.    ALLERGIES: Allergies  Allergen Reactions  . Amoxicillin-Pot Clavulanate Nausea And Vomiting, Other (See Comments), Itching and Nausea Only    Has patient had a PCN reaction causing immediate rash, facial/tongue/throat swelling, SOB or lightheadedness with hypotension: No Has patient had a PCN reaction causing severe rash involving mucus membranes or skin necrosis: No Has patient had a PCN reaction that required hospitalization: No Has patient had a PCN reaction occurring within the last 10 years: No If all of the above answers are "NO", then may proceed with Cephalosporin use. Has patient had a PCN reaction causing immediate rash,  facial/tongue/throat swelling, SOB or lightheadedness with hypotension: No Has patient had a PCN reaction causing severe rash involving mucus membranes or skin necrosis: No Has patient had a PCN reaction that required hospitalization: No Has patient had a PCN reaction occurring within the last 10 years: No If all of the above answers are "NO", then may proceed with Cephalosporin use.   . Ace Inhibitors Cough  . Entex La Itching  . Levofloxacin Hives and Itching  . Meloxicam    VACCINATION STATUS: Immunization History  Administered Date(s) Administered  . Influenza Whole 09/01/2016  . Influenza, High Dose Seasonal PF 09/11/2018  . Influenza-Unspecified 08/02/2013, 08/29/2015  . PFIZER SARS-COV-2 Vaccination 12/16/2019, 01/17/2020, 09/28/2020     HPI  Thyroid Problem Presents for follow-up (diagnosed with hypothyroidism at approximate age of 23 years by her PCP.  family history of thyroid disorders in her daughter (hypothyroidism with nodules).  No family history of thyroid cancer.) visit. Symptoms include anxiety, fatigue, palpitations and tremors. Patient reports no cold intolerance, constipation, depressed mood, diarrhea, heat intolerance, leg swelling, weight gain or weight loss. The symptoms have been improving.    CERIAH KOHLER  is a patient with the above medical history.  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    I reviewed patient's thyroid tests:  Lab Results  Component Value Date   TSH 2.39 09/28/2020   TSH 0.60 07/25/2020   FREET4 1.4 09/28/2020     Pt describes: - weight loss - fatigue/anxiety - palpitations - tremors - insomnia which has resolved since lowering her Levothyroxine dose  Pt denies feeling nodules in neck, hoarseness, dysphagia/odynophagia, SOB with lying down.  she has No history of radiation therapy to head or neck.  No recent use of iodine supplements.  I reviewed her chart and she also has a history of  GERD with  ulcers, depression/anxiety, anemia, asthma, and high blood pressure.   ROS:  Constitutional: + unintentional weight loss (has recently gained some back since lowering dose of levothyroxine), + fatigue, no subjective hyperthermia, no subjective hypothermia Eyes: no blurry vision, no xerophthalmia ENT: no sore throat, no nodules palpated in throat, no dysphagia/odynophagia, no hoarseness Cardiovascular: no Chest Pain, no Shortness of Breath, + palpitations, + leg swelling Respiratory: no cough, no SOB Gastrointestinal: no Nausea/Vomiting/Diarrhea Musculoskeletal: no muscle/joint aches Skin: no rashes Neurological: + tremors, no numbness, no tingling, no dizziness Psychiatric: no depression, + anxiety, insomnia has resolved   OBJECTIVE:  Wt 137 lb (62.1 kg)   BMI 23.52 kg/m  Wt Readings from Last 3 Encounters:  10/06/20 137 lb (62.1 kg)  08/23/20 135 lb (61.2 kg)  08/02/20 139 lb 12.8 oz (63.4 kg)   BP Readings from Last 3 Encounters:  09/11/20 (!) 147/82  08/23/20 135/78  08/02/20 132/78    Physical Exam- Telehealth- significantly limited due to nature of visit  Constitutional: Body mass index is 23.52 kg/m. , not in acute distress, normal state of mind Respiratory: Adequate breathing efforts   CMP ( most recent) CMP     Component Value Date/Time   NA 139 09/11/2020 1546   NA 141 09/24/2018 1204   K 3.8 09/11/2020 1546   CL 103 09/11/2020 1546   CO2 26 09/11/2020 1546   GLUCOSE 99 09/11/2020 1546   BUN 14 09/11/2020 1546   BUN 13 09/24/2018 1204   CREATININE 0.76 09/11/2020 1546   CALCIUM 9.1 09/11/2020 1546   PROT 7.4 05/08/2020 1232   PROT 6.5 09/24/2018 1204   ALBUMIN 4.4 05/08/2020 1232   ALBUMIN 4.3 09/24/2018 1204   AST 29 05/08/2020 1232   ALT 29 05/08/2020 1232   ALKPHOS 62 05/08/2020 1232   BILITOT 0.5 05/08/2020 1232   BILITOT 0.4 09/24/2018 1204   GFRNONAA >60 09/11/2020 1546   GFRAA >60 05/08/2020 1232     Diabetic Labs (most  recent): No results found for: HGBA1C   Lipid Panel ( most recent) Lipid Panel     Component Value Date/Time   TRIG 92 09/11/2020 1547       Lab Results  Component Value Date   TSH 2.39 09/28/2020   TSH 0.60 07/25/2020   FREET4 1.4 09/28/2020       ASSESSMENT / PLAN: 1. Hypothyroidism- unspecified etiology   Patient with long-standing hypothyroidism, on levothyroxine therapy. On physical exam, patient  does not have gross goiter, thyroid nodules, or neck compression symptoms.  Thyroid US WNL for age.  -Her previsit thyroid function tests are consistent with appropriate hormone replacement.  She is advised to continue her dose of Levothyroxine to 50 mcg po daily before breakfast.  - We discussed about correct intake of levothyroxine, at fasting, with water, separated by at least 30 minutes from breakfast. -Patient is made aware of the fact that thyroid hormone replacement is needed for life, dose to be adjusted by periodic monitoring of thyroid function tests. - Will check more comprehensive thyroid tests before next visit in 9 weeks.    I spent 20 minutes dedicated to the care of this patient on the date of this encounter to include pre-visit review of records, face-to-face time with the patient, and post visit ordering of  testing.   Follow Up Plan: Return in about 4 months (around 02/03/2021) for Thyroid follow up, Previsit labs.    Rayetta Pigg, [facility] [address] [place], [place] [address]  Phone: [phone] Fax: [phone]  10/06/2020, 11:02 AM

## 2020-10-10 ENCOUNTER — Ambulatory Visit (INDEPENDENT_AMBULATORY_CARE_PROVIDER_SITE_OTHER): Payer: Medicare HMO | Admitting: Podiatry

## 2020-10-10 ENCOUNTER — Other Ambulatory Visit: Payer: Self-pay

## 2020-10-10 DIAGNOSIS — L84 Corns and callosities: Secondary | ICD-10-CM | POA: Diagnosis not present

## 2020-10-11 ENCOUNTER — Telehealth: Payer: Self-pay | Admitting: Internal Medicine

## 2020-10-11 ENCOUNTER — Telehealth: Payer: Self-pay | Admitting: Nurse Practitioner

## 2020-10-11 NOTE — Telephone Encounter (Signed)
Returned call to patient, she stated that over the past 3 nights she has been waking up at 3-4 am and is unable to fall back asleep. Stated the only medication change was amoxicillin for a tooth that was pulled, took last dose at noon today, no other changes recently, wondering if it could be the amox.? I advised patient that I would ask you, asked her if maybe the time change has ever affected her, she denies. Please advise on your thoughts.

## 2020-10-11 NOTE — Telephone Encounter (Signed)
Pt is requesting a call back from the nurse in regards to her recent phone visit.

## 2020-10-11 NOTE — Telephone Encounter (Signed)
Patient advised and verbalized understanding 

## 2020-10-11 NOTE — Telephone Encounter (Signed)
Please call patient regarding her protonix. [phone]

## 2020-10-11 NOTE — Telephone Encounter (Signed)
As she has been taking Protonix BID, I recommend she decrease Protonix to once daily for now to see how she does. If she continues to do well for 2-4 weeks, we can taper off Protonix completely. She can call back in 2-4 weeks with a progress report.

## 2020-10-11 NOTE — Progress Notes (Signed)
Subjective: 79 year old female presents the office today for follow up evaluation of calluses on her right foot.  She states that she keeps moisturizer on them and they are doing better.  Denies any open sores or any redness or swelling or any drainage. No redness, swelling or red streaks. Denies any systemic complaints such as fevers, chills, nausea, vomiting. No acute changes since last appointment, and no other complaints at this time.   Objective: AAO x3, NAD DP/PT pulses palpable bilaterally, CRT less than 3 seconds Bunions present with hallux abductus resulting hyperkeratotic lesion on the medial hallux IPJ as well as the first MPJ.  There is no underlying ulceration drainage or any signs of infection. No pain with calf compression, swelling, warmth, erythema  Assessment: Preulcerative calluses right foot  Plan: -All treatment options discussed with the patient including all alternatives, risks, complications.  -Debrided hyperkeratotic lesions x2 without any complications or bleeding.  Continue moisturizer as well as offloading shoes.  Monitoring skin breakdown.  Discussed with her likely calluses are coming back due to the bunion and pressure. -Patient encouraged to call the office with any questions, concerns, change in symptoms.   Trula Slade DPM

## 2020-10-11 NOTE — Telephone Encounter (Signed)
She has some serious anxiety as well (on meds).  When I spoke with her, her sleeping was better.  I think it probably does have something to do with the time change.  Her thyroid tests were normal since we lowered her dose of levothyroxine, so I don't think it is her thyroid causing her insomnia at this point.

## 2020-10-11 NOTE — Telephone Encounter (Signed)
Pt called and wants to know if she can d/c the Pantoprazole. Pt state that she isn't having any chest, abdominal pain or Gerd symptoms.  Pt doesn't like to take medications and not need them per pt. Please advise.

## 2020-10-12 NOTE — Telephone Encounter (Signed)
Spoke with pt. Pt was notified of recommendations of Aliene Al[place], [place]. Pt is going to decrease Protonix to once daily and call back with a progress report in 2-4 weeks.

## 2020-10-21 ENCOUNTER — Other Ambulatory Visit: Payer: Self-pay | Admitting: Gastroenterology

## 2020-10-21 DIAGNOSIS — R110 Nausea: Secondary | ICD-10-CM

## 2020-11-01 NOTE — Progress Notes (Signed)
Referring Provider: [name], MD Primary Care Physician:  [name], MD Primary GI Physician: Dr. Gala Romney  Chief Complaint  Patient presents with  . Gastroesophageal Reflux    ok with decreasing Protonix once a day  . Constipation    taking Miralax every night; thinks stool looks like bait worms    HPI:   [name] is a 79 y.o. female presenting today for follow-up on nausea without vomiting, weight loss, and chronic constipation.  Colonoscopy in October 2017 for screening purposes with pancolonic diverticulosis, melanosis coli, otherwise normal exam with no recommendations to repeat.  Nausea began in late May/early June.  She had some associated right sided pain initially and also reported taking 2 ibuprofen every 6-8 hours for neck pain about 1 month prior to onset.  She was treated empirically for colitis with Cipro by PCP in June.  CT A/P with contrast in June 2021 for lower abdominal pain, nausea, and loose stools revealing diverticulosis without diverticulitis and no other acute findings.   She was last seen in our office 08/23/2020.  She had lost an additional 8 pounds over the last 2 months, total of 24 pound weight loss over the last year.  Nausea resolved with PPI twice daily and was no longer requiring Zofran.  She is still following a bland diet.  Felt she could eat more if her diet was not so restricted.  Later stated appetite has been down for the last few months.  Had recently started Effexor 2 months prior with anorexia as a possible side effect occurring 8-20% of the time. Denied early satiety or dysphagia.  GERD well controlled.  No abdominal pain.  Continue to avoid ibuprofen.  BMs every 2 to 3 days with thin stools rather than normal caliber stools.  She is taking 2 stool softeners at bedtime and MiraLAX if she went 2-3 days without a BM.  Considering weight loss, change in bowel habits, and decreased appetite, plan for colonoscopy and EGD. Also advised to start  MiraLAX daily, continue Protonix 40 mg twice daily for now, advance diet as tolerated, add protein shakes to help maintain weight, discuss Effexor with PCP.  Patient called 08/24/2020 stating she went to cancel her procedures until her thyroid abnormalities were addressed.  Telephone call 09/14/2020 stating MiraLAX did not seem to be working very well.  Recommended trial of Linzess 72 mcg daily.  Patient stated she would think about Linzess and would let us know if she wanted to try the medication.  Telephone call 10/11/2020 with patient requesting to discontinue Protonix as she was not having any chest pain, abdominal pain, or GERD symptoms.  I recommended she decrease to Protonix once a day for now and call with a progress report in 2 to 4 weeks.  No progress report received.  Today:  Weight loss: Down 1 lb in the last 3 months, 17 lbs over the last 6 months, and total of 25 lb weight loss over the last year.   Continues to have a lot of anxiety. 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 Still taking Effexor. This helps somewhat with anxiety, but other days it doesn't.   Nausea has not returned. Taking Protonix 40 mg daily. No GERD or indigestion symptoms. Wanting to discontinue Protonix. Changed the way she was taking Effexor and blood pressure medications (taking them together) and wonders if this has also helped her nausea.   Continues to have a decreased  appetite. Eating breakfast, something small at lunch, and eats dinner. No early satiety. No dysphagia. Hasn't discussed Effexor with PCP yet. No abdominal pain. Drinking Ensure 1-2 times daily.   Constipation: Taking MiraLAX nightly. BMs daily. Has 2-3 very small, thin BMs every morning. Occasional straining. BMs are incomplete. No blood in the stools or black stool.    Past Medical History:  Diagnosis Date  . Anemia   . Anxiety   . Arthritis   . Asthma   . Constipation   . GERD (gastroesophageal reflux  disease)   . High blood pressure   . Hypothyroidism   . Seasonal allergies   . Thyroid disease   . Varicose veins   . Vertigo 11/23/2013    Past Surgical History:  Procedure Laterality Date  . APPENDECTOMY    . CATARACT EXTRACTION W/PHACO Left 01/27/2017   Procedure: CATARACT EXTRACTION PHACO AND INTRAOCULAR LENS PLACEMENT (IOC);  Surgeon: Tonny Branch, MD;  Location: [facility];  Service: Ophthalmology;  Laterality: Left;  CDE: 29.90  . CATARACT EXTRACTION W/PHACO Right 02/24/2017   Procedure: CATARACT EXTRACTION PHACO AND INTRAOCULAR LENS PLACEMENT (IOC);  Surgeon: Tonny Branch, MD;  Location: [facility];  Service: Ophthalmology;  Laterality: Right;  CDE: 8.57  . CHOLECYSTECTOMY    . COLONOSCOPY N/A 10/01/2016   Procedure: COLONOSCOPY;  Surgeon: Daneil Dolin, MD; pancolonic diverticulosis, melanosis coli, otherwise normal exam.  No recommendations to repeat colonoscopy.  Marland Kitchen FLEXOR TENDON REPAIR Right 11/06/2017   Procedure: RIGHT WRIST FLEXOR TENDON REPAIRAND STT [place];  Surgeon: Leanora Cover, MD;  Location: [facility];  Service: Orthopedics;  Laterality: Right;  . TUBAL LIGATION      Current Outpatient Medications  Medication Sig Dispense Refill  . albuterol (VENTOLIN HFA) 108 (90 Base) MCG/ACT inhaler Inhale 2 puffs into the lungs every 6 (six) hours as needed for wheezing or shortness of breath.    . ALPRAZolam (XANAX) 0.5 MG tablet Take 0.5 mg by mouth 3 (three) times daily as needed for anxiety.     Marland Kitchen amLODipine (NORVASC) 2.5 MG tablet Take 2.5 mg by mouth every evening.     Marland Kitchen aspirin EC 81 MG tablet Take 81 mg by mouth daily.    . fluocinonide gel (LIDEX) 0.05 % Apply topically 3 (three) times daily.    . fluticasone (FLONASE) 50 MCG/ACT nasal spray Place 2 sprays into both nostrils as needed.     Marland Kitchen levothyroxine (SYNTHROID) 50 MCG tablet Take 1 tablet (50 mcg total) by mouth daily. 90 tablet 3  . losartan (COZAAR) 25 MG tablet Take 25 mg by mouth daily.    . Multiple  Vitamin (MULTIVITAMIN) tablet Take 1 tablet by mouth daily.    . pantoprazole (PROTONIX) 40 MG tablet Take 1 tablet (40 mg total) by mouth daily. 30 tablet 3  . polyethylene glycol (MIRALAX / GLYCOLAX) 17 g packet Take 17 g by mouth daily.    Marland Kitchen PREVIDENT 5000 BOOSTER PLUS 1.1 % PSTE     . raloxifene (EVISTA) 60 MG tablet Take 1 tablet (60 mg total) by mouth every morning. (Patient taking differently: Take 60 mg by mouth every other day. ) 30 tablet 12  . SYMBICORT 80-4.5 MCG/ACT inhaler INHALE 2 PUFFS INTO THE LUNGS TWICE DAILY. (Patient taking differently: Inhale 2 puffs into the lungs daily. QD am) 10.2 g 0  . venlafaxine XR (EFFEXOR-XR) 37.5 MG 24 hr capsule Take 37.5 mg by mouth daily with breakfast.      No current facility-administered medications  for this visit.    Allergies as of 11/02/2020 - Review Complete 11/02/2020  Allergen Reaction Noted  . Amoxicillin-pot clavulanate Nausea And Vomiting, Other (See Comments), Itching, and Nausea Only 10/04/2013  . Ace inhibitors Cough   . Entex la Itching   . Levofloxacin Hives and Itching 01/21/2017  . Meloxicam  04/25/2020    Family History  Problem Relation Age of Onset  . Hypertension Mother   . Varicose Veins Father   . Heart attack Father   . Heart disease Father   . Hypertension Brother   . COPD Brother     Social History   Socioeconomic History  . Marital status: Married    Spouse name: Not on file  . Number of children: Not on file  . Years of education: Not on file  . Highest education level: Not on file  Occupational History  . Not on file  Tobacco Use  . Smoking status: Former Smoker    Packs/day: 0.25    Years: 5.00    Pack years: 1.25    Types: Cigarettes    Quit date: 12/02/1997    Years since quitting: 22.9  . Smokeless tobacco: Never Used  Vaping Use  . Vaping Use: Never used  Substance and Sexual Activity  . Alcohol use: Not Currently    Alcohol/week: 1.0 standard drink    Types: 1 Glasses of wine  per week    Comment: occasional wine  . Drug use: No  . Sexual activity: Yes    Birth control/protection: Surgical    Comment: tubal  Other Topics Concern  . Not on file  Social History Narrative  . Not on file   Social Determinants of Health   Financial Resource Strain: Low Risk   . Difficulty of Paying Living Expenses: Not hard at all  Food Insecurity: No Food Insecurity  . Worried About Charity fundraiser in the Last Year: Never true  . Ran Out of Food in the Last Year: Never true  Transportation Needs: No Transportation Needs  . Lack of Transportation (Medical): No  . Lack of Transportation (Non-Medical): No  Physical Activity: Inactive  . Days of Exercise per Week: 0 days  . Minutes of Exercise per Session: 0 min  Stress: Stress Concern Present  . Feeling of Stress : To some extent  Social Connections: Socially Integrated  . Frequency of Communication with Friends and Family: More than three times a week  . Frequency of Social Gatherings with Friends and Family: More than three times a week  . Attends Religious Services: More than 4 times per year  . Active Member of Clubs or Organizations: Yes  . Attends Archivist Meetings: More than 4 times per year  . Marital Status: Married    Review of Systems: Gen: Denies fever, chills, cold or flulike symptoms, presyncope, syncope. CV: Denies chest pain or palpitations. Resp: Denies dyspnea or cough.Marland Kitchen GI: See HPI.  Heme: See HPI  Physical Exam: BP 125/82   Pulse 90   Temp (!) 96.9 F (36.1 C) (Temporal)   Ht 5\' 4"  (1.626 m)   Wt 134 lb 12.8 oz (61.1 kg)   BMI 23.14 kg/m  General:   Alert and oriented. No distress noted. Pleasant and cooperative.  Head:  Normocephalic and atraumatic. Eyes:  Conjuctiva clear without scleral icterus. Heart:  S1, S2 present without murmurs appreciated. Lungs:  Clear to auscultation bilaterally. No wheezes, rales, or rhonchi. No distress.  Abdomen:  +BS, soft, non-tender  and  non-distended. No rebound or guarding. No HSM or masses noted. Msk:  Symmetrical without gross deformities. Normal posture. Extremities:  Without edema. Neurologic:  Alert and  oriented x4 Psych: Normal mood and affect.

## 2020-11-02 ENCOUNTER — Encounter: Payer: Self-pay | Admitting: Gastroenterology

## 2020-11-02 ENCOUNTER — Other Ambulatory Visit: Payer: Self-pay

## 2020-11-02 ENCOUNTER — Telehealth: Payer: Self-pay

## 2020-11-02 ENCOUNTER — Ambulatory Visit: Payer: Medicare HMO | Admitting: Gastroenterology

## 2020-11-02 VITALS — BP 125/82 | HR 90 | Temp 96.9°F | Ht 64.0 in | Wt 134.8 lb

## 2020-11-02 DIAGNOSIS — K5900 Constipation, unspecified: Secondary | ICD-10-CM

## 2020-11-02 DIAGNOSIS — R110 Nausea: Secondary | ICD-10-CM

## 2020-11-02 DIAGNOSIS — R195 Other fecal abnormalities: Secondary | ICD-10-CM

## 2020-11-02 DIAGNOSIS — R634 Abnormal weight loss: Secondary | ICD-10-CM | POA: Diagnosis not present

## 2020-11-02 DIAGNOSIS — K30 Functional dyspepsia: Secondary | ICD-10-CM | POA: Diagnosis not present

## 2020-11-02 NOTE — Patient Instructions (Addendum)
We will get you scheduled for colonoscopy and upper endoscopy in the near future with Dr. Gala Romney.  As you requested, we will try weaning you off of Protonix.  Take Protonix once daily every other day x1 week, then take every third day x1 week, then stop.  Monitor for any return of nausea, acid reflux symptoms, or indigestion and let me know if this occurs.  Stop MiraLAX and try Linzess 72 mcg daily 30 minutes before your first meal.  We are providing you with samples today. Please call in 1-2 weeks with a progress report.  If this works well, we will send in a prescription.  Please discuss Effexor with your primary care provider to determine if this may be contributing to your lack of appetite.  We will see you back after your procedures.  Do not hesitate to call if you have questions or concerns prior.  Aliene Altes, PA-C [facility]

## 2020-11-02 NOTE — Telephone Encounter (Signed)
PA for TCS and EGD submitted via [employer] website. TCS is going to clinical review. [place] tracking# [id number]. Will submit clinicals when OV note is complete.  EGD approved. [employer]# [id number], valid 12/21/20-01/20/21.

## 2020-11-03 ENCOUNTER — Other Ambulatory Visit: Payer: Self-pay

## 2020-11-03 NOTE — Telephone Encounter (Signed)
Received fax from [facility], TCS approved. [employer]# [id number], valid 12/21/20-01/20/21.

## 2020-11-04 ENCOUNTER — Encounter: Payer: Self-pay | Admitting: Gastroenterology

## 2020-11-04 NOTE — Assessment & Plan Note (Signed)
79 year old female with history of indigestion. Currently on Protonix 40 mg daily without breakthrough symptoms and requesting to discontinue Protonix. Notably, Protonix had been increased to BID dosing in July 2021 due to ongoing nausea. Nausea has resolved and Protonix was decreased to once daily dosing around 10/11/2020.   Advised that we can try to wean her off Protonix and monitor for return of indigestion or nausea. Recommended Protonix every other day x1 week, then every third day x1 week, and then discontinue. She will let us know if she has return of indigestion and nausea.

## 2020-11-04 NOTE — Assessment & Plan Note (Signed)
Addressed under change in stool caliber.

## 2020-11-04 NOTE — Assessment & Plan Note (Addendum)
79 year old female with 25 pound unintentional weight loss over the last year. Weight loss is likely multifactorial. Previously with persistent nausea without vomiting that began in May/June of this year. Nausea resolved after increasing Protonix to 40 mg twice daily. Currently on Protonix daily and continues to do well. She does continue to have decreased appetite but denies early satiety. Also with worsening constipation and change in stool caliber. Denies abdominal pain, BRBPR, melena, or other significant GI symptoms. CT A/P with contrast June 2021 with no acute findings. Last colonoscopy October 2017 for screening purposes with pancolonic diverticulosis, melanosis coli, otherwise normal exam. No prior EGD. 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 She was also started on Effexor several months ago which has possible side effect of anorexia occurring 8-22% of the time. Thyroid function has also been on the low side. She follows with Dr. Dorris Fetch and has had medication adjustments with TSH improved to 2.39 in October 2021. Reviewed labs completed over the last month with CBC and CMP unrevealing. Lipase wnl.   Although weight loss is likely multifactorial, I feel we need to go ahead and evaluate her upper and lower GI tract to rule out occult etiologies including malignancy.   Plan: Proceed with colonoscopy and EGD with propofol with Dr. Gala Romney in the near future. The risks, benefits, and alternatives have been discussed with the patient in detail. The patient states understanding and desires to proceed.  ASA II Continue Ensure 1-2 times daily. Trial Linzess 72 mcg daily. Samples provided. Requested 1-2-week progress report. Advised to discuss Effexor with her PCP to determine if this may be contributing to her lack of appetite. Follow-up after procedures.

## 2020-11-04 NOTE — Assessment & Plan Note (Addendum)
79 year old female with history of chronic constipation that had previously been well controlled on stool softeners now currently dealing with worsening constipation and change in stool caliber x3 months. She is taking MiraLAX nightly which is producing 2-3 very small, thin caliber BMs every morning. Continues to feel BMs are incomplete and require straining at times. Denies BRBPR or melena. Notably, she has had ongoing unintentional weight loss over the last year with total weight loss of 25 pounds. CT A/P with contrast June 2021 with no acute findings. Last colonoscopy in October 2017 for screening purposes with pancolonic diverticulosis, melanosis coli, otherwise normal exam.  Due to weight loss and change in stool caliber, patient needs colonoscopy for further evaluation.  Plan: Trial Linzess 72 mcg daily. Samples provided. Requested progress report in 1-2 weeks. Proceed with colonoscopy with propofol with Dr. Gala Romney in the near future. The risks, benefits, and alternatives have been discussed with the patient in detail. The patient states understanding and desires to proceed.  ASA II Follow-up after procedure.

## 2020-11-04 NOTE — Assessment & Plan Note (Addendum)
Resolved after increasing Protonix to 40 mg BID. Protonix was reduced to once daily in November 2021. She continues to do well and is asking to discontinue Protonix. Notably, she does have history of indigestion but is currently without symptoms. Query whether she may have had gastritis or PUD contributing to nausea without vomiting.   Advised that we can try to wean her off Protonix and monitor for return of indigestion or nausea. Recommended Protonix every other day x1 week, then every third day x1 week, and then discontinue. She will let us know if she has return of indigestion and nausea.

## 2020-11-06 ENCOUNTER — Other Ambulatory Visit: Payer: Self-pay

## 2020-11-06 ENCOUNTER — Ambulatory Visit: Payer: Medicare HMO | Admitting: Podiatry

## 2020-11-06 ENCOUNTER — Telehealth: Payer: Self-pay | Admitting: Podiatry

## 2020-11-06 ENCOUNTER — Telehealth: Payer: Self-pay | Admitting: Internal Medicine

## 2020-11-06 DIAGNOSIS — L84 Corns and callosities: Secondary | ICD-10-CM | POA: Diagnosis not present

## 2020-11-06 DIAGNOSIS — R609 Edema, unspecified: Secondary | ICD-10-CM | POA: Diagnosis not present

## 2020-11-06 DIAGNOSIS — M21619 Bunion of unspecified foot: Secondary | ICD-10-CM

## 2020-11-06 MED ORDER — MUPIROCIN 2 % EX OINT: 1.0000 "application " | TOPICAL_OINTMENT | Freq: Every day | CUTANEOUS | 2 refills | Status: DC

## 2020-11-06 NOTE — Telephone Encounter (Signed)
Pt called and stated that the black sore has come back under the callus. She states its painful to walk. She wanted to know what to do for this issue

## 2020-11-06 NOTE — Telephone Encounter (Signed)
She should come in to be seen. It is likely dried blood but I would like for her to come in be seen.

## 2020-11-06 NOTE — Telephone Encounter (Signed)
Spoke with pt. Pt talked with her PCP and was told Effexor can cause loss of appetite. Pt states that she had the same symptoms with colitis prior to starting the Effexor. Pt is also taking Linzess 72 mcg samples. Pt felt it was working well at first and then pt had to strain the last two days to have a BM. Pt d/c Miralax well when starting Linsess 72 mcg.

## 2020-11-06 NOTE — Telephone Encounter (Signed)
Lmom, waiting on a return call.  

## 2020-11-06 NOTE — Telephone Encounter (Signed)
Pt wants to speak to nurse about what her PCP told her about her loss of appetite. Please call [phone]

## 2020-11-06 NOTE — Telephone Encounter (Signed)
Yes, we had discussed that loss of appetite may be related to Effexor. I had advised she discuss the medication with PCP to determine if another medication may be more appropriate.   Regarding Linzess, if she would like, we can try her on Linzess 145 mcg daily if the Linzess 72 mcg isn't quite hitting the mark. We can provide samples.

## 2020-11-07 ENCOUNTER — Other Ambulatory Visit: Payer: Self-pay | Admitting: Gastroenterology

## 2020-11-07 NOTE — Telephone Encounter (Signed)
Spoke with pt. Pt wants to stay at the Linzess 72 mcg. It worked well today and pt feels she needed to give it time to work the right way. Pt will see her PCP next week and will discuss changing the Effexor due to the loss of appetite.

## 2020-11-07 NOTE — Progress Notes (Signed)
error 

## 2020-11-07 NOTE — Telephone Encounter (Signed)
Noted. I can send Rx of Linzess 72 mcg to her pharmacy. Where would she like me to send this? Also, please let her know if medication is too expensive, she should reach out to our office.

## 2020-11-08 ENCOUNTER — Other Ambulatory Visit: Payer: Self-pay | Admitting: Gastroenterology

## 2020-11-08 DIAGNOSIS — K5900 Constipation, unspecified: Secondary | ICD-10-CM

## 2020-11-08 MED ORDER — LINACLOTIDE 72 MCG PO CAPS: 72.0000 ug | ORAL_CAPSULE | Freq: Every day | ORAL | 5 refills | Status: DC

## 2020-11-08 NOTE — Telephone Encounter (Signed)
Rx sent 

## 2020-11-08 NOTE — Telephone Encounter (Signed)
Spoke with pt. Linzess cost is $45.00. pt is ok with paying that price and wants to continue her samples to make sure it works the way it should before purchasing it.

## 2020-11-08 NOTE — Telephone Encounter (Signed)
Noted  

## 2020-11-08 NOTE — Telephone Encounter (Signed)
Spoke with pt. Pt would like medication sent to [facility] on [address]. Pt will call back if too expensive.

## 2020-11-09 DIAGNOSIS — F411 Generalized anxiety disorder: Secondary | ICD-10-CM | POA: Diagnosis not present

## 2020-11-09 DIAGNOSIS — Z6822 Body mass index (BMI) 22.0-22.9, adult: Secondary | ICD-10-CM | POA: Diagnosis not present

## 2020-11-09 DIAGNOSIS — F5105 Insomnia due to other mental disorder: Secondary | ICD-10-CM | POA: Diagnosis not present

## 2020-11-09 DIAGNOSIS — K5904 Chronic idiopathic constipation: Secondary | ICD-10-CM | POA: Diagnosis not present

## 2020-11-09 DIAGNOSIS — F331 Major depressive disorder, recurrent, moderate: Secondary | ICD-10-CM | POA: Diagnosis not present

## 2020-11-10 NOTE — Progress Notes (Signed)
Subjective: 79 year old female presents the office today for an acute appointment as she was concerned that there was a dark spot underneath the callus on the right side.  She denies any open sores and she has not seen any drainage no increase in swelling or redness.  She is concerned that she is previously had cellulitis of the area. Denies any systemic complaints such as fevers, chills, nausea, vomiting. No acute changes since last appointment, and no other complaints at this time.   Objective: AAO x3, NAD DP/PT pulses palpable bilaterally, CRT less than 3 seconds Hyperkeratotic tissue present medial hallux IPJ.  Small mount of dried blood present. Upon debridement there is no open lesion identified there is no drainage or pus was ascending cellulitis.  There is no fluctuation or crepitation.  There is no malodor. Bunion present. No pain with calf compression, swelling, warmth, erythema  Assessment: Preulcerative callus right foot  Plan: -All treatment options discussed with the patient including all alternatives, risks, complications.  -Calluses gotten thicker since I last saw her couple weeks ago.  I sharply debrided this without any complications down to healthy tissue.  She keeps a pad on the area with a compression socks I think this is causing irritation.  For now and try just a Band-Aid with a compression sock but she also has an open toed compression sock that she can use with the offloading pads.  Monitor for any skin breakdown or any signs or symptoms of infection. -Patient encouraged to call the office with any questions, concerns, change in symptoms.   Trula Slade DPM

## 2020-11-14 ENCOUNTER — Telehealth: Payer: Self-pay | Admitting: Podiatry

## 2020-11-14 NOTE — Telephone Encounter (Signed)
[name]- can you please call her and have her use the mupirocin ointment on the toe. Please make sure there is no drainage, redness, red streaks. When I saw her last there was a callus with dried blood and that is likely what she is seeing but want to make sure it is not open. If it is then she should come in tomorrow to see someone.

## 2020-11-14 NOTE — Telephone Encounter (Signed)
Patient stated she is still experiencing pain, small spot looks like skin is broken and still sore with small visible cut. Patient wants to know if she should continue antibiotic or start on the creme that was prescribed, Please advise

## 2020-11-15 ENCOUNTER — Other Ambulatory Visit: Payer: Self-pay | Admitting: Podiatry

## 2020-11-15 ENCOUNTER — Telehealth: Payer: Self-pay | Admitting: Podiatry

## 2020-11-15 MED ORDER — DOXYCYCLINE HYCLATE 100 MG PO TABS: 100.0000 mg | ORAL_TABLET | Freq: Two times a day (BID) | ORAL | 0 refills | Status: DC

## 2020-11-15 NOTE — Telephone Encounter (Signed)
I called patient. There is no opening. No drainage. There is some slight redness but no red streaks. No fevers/chills or other systemic concerns. Recommended to use the antibiotic cream to the areas. Sent doxycyline. Recommended to wear surgical shoe. She didn't want to come in but told her to update me with any changes. She is aware I am in the office tomorrow if she needs anything.

## 2020-11-15 NOTE — Telephone Encounter (Signed)
Pt states the callus below her toe is slightly red. She isn't experiencing any pain or drainage. Please advise.

## 2020-11-20 ENCOUNTER — Telehealth: Payer: Self-pay | Admitting: Internal Medicine

## 2020-11-20 NOTE — Telephone Encounter (Signed)
PLEASE CALL PATIENT, SHE WAS PUT ON LINZESS AND YESTERDAY SHE STRAINED AND NOW HER SIDE IS HURTING, WANTS TO KNOW IF SHE MAY

## 2020-11-20 NOTE — Telephone Encounter (Signed)
This communication should be routed to staff member standing in for me in my absence.

## 2020-11-21 ENCOUNTER — Other Ambulatory Visit: Payer: Self-pay

## 2020-11-21 NOTE — Telephone Encounter (Signed)
Noted. Agree with providing samples of Linzess 145 mcg x3 boxes. Have her call with a progress report next week. If this works well, we will send in Rx.

## 2020-11-21 NOTE — Telephone Encounter (Signed)
[name] please call the patient and triage her

## 2020-11-21 NOTE — Telephone Encounter (Signed)
Noted. Pt will call with a progress report of Linzess 145 mcg is.

## 2020-11-21 NOTE — Patient Outreach (Signed)
[facility]) Care Management  [facility] Care Manager  11/21/2020   [name] [birth year] [id number]  Subjective: Telephone call to patient for disease management follow up. Patient reports she is doing ok.  Scheduled for a colonoscopy as she has had some problems with constipation and some weight loss.  She continues to care for spouse who has alzheimer's. She admits some anxiety at times but she controls with medication.  Patient reports that her blood pressure has been good with last reading 128/89.  Encouraged patient to continue medication management, diet and controlling anxiety.  She verbalized understanding and voices no concerns.    Objective:   Encounter Medications:  Outpatient Encounter Medications as of 11/21/2020  Medication Sig Note  . albuterol (VENTOLIN HFA) 108 (90 Base) MCG/ACT inhaler Inhale 2 puffs into the lungs every 6 (six) hours as needed for wheezing or shortness of breath. 08/26/2019: Reports has not needed recently  . ALPRAZolam (XANAX) 0.5 MG tablet Take 0.5 mg by mouth 3 (three) times daily as needed for anxiety.    Marland Kitchen amLODipine (NORVASC) 2.5 MG tablet Take 2.5 mg by mouth every evening.    Marland Kitchen aspirin EC 81 MG tablet Take 81 mg by mouth daily.   Marland Kitchen doxycycline (VIBRA-TABS) 100 MG tablet Take 1 tablet (100 mg total) by mouth 2 (two) times daily.   . fluocinonide gel (LIDEX) 0.05 % Apply topically 3 (three) times daily.   . fluticasone (FLONASE) 50 MCG/ACT nasal spray Place 2 sprays into both nostrils as needed.    Marland Kitchen levothyroxine (SYNTHROID) 50 MCG tablet Take 1 tablet (50 mcg total) by mouth daily.   Marland Kitchen linaclotide (LINZESS) 72 MCG capsule Take 1 capsule (72 mcg total) by mouth daily before breakfast.   . losartan (COZAAR) 25 MG tablet Take 25 mg by mouth daily.   . Multiple Vitamin (MULTIVITAMIN) tablet Take 1 tablet by mouth daily.   . mupirocin ointment (BACTROBAN) 2 % Apply 1 application topically daily.   . pantoprazole (PROTONIX) 40 MG  tablet Take 1 tablet (40 mg total) by mouth daily.   . polyethylene glycol (MIRALAX / GLYCOLAX) 17 g packet Take 17 g by mouth daily.   Marland Kitchen PREVIDENT 5000 BOOSTER PLUS 1.1 % PSTE    . raloxifene (EVISTA) 60 MG tablet Take 1 tablet (60 mg total) by mouth every morning. (Patient taking differently: Take 60 mg by mouth every other day.)   . SYMBICORT 80-4.5 MCG/ACT inhaler INHALE 2 PUFFS INTO THE LUNGS TWICE DAILY. (Patient taking differently: Inhale 2 puffs into the lungs daily. QD am) 08/26/2019: Reports using QD in am  . venlafaxine XR (EFFEXOR-XR) 37.5 MG 24 hr capsule Take 37.5 mg by mouth daily with breakfast.     No facility-administered encounter medications on file as of 11/21/2020.    Functional Status:  In your present state of health, do you have any difficulty performing the following activities: 11/21/2020  Hearing? Y  Comment wears hearing aids  Vision? N  Difficulty concentrating or making decisions? N  Walking or climbing stairs? N  Dressing or bathing? N  Doing errands, shopping? N  Preparing Food and eating ? N  Using the Toilet? N  In the past six months, have you accidently leaked urine? N  Do you have problems with loss of bowel control? N  Managing your Medications? N  Managing your Finances? N  Housekeeping or managing your Housekeeping? N  Some recent data might be hidden    Fall/Depression Screening: Fall Risk  11/21/2020 05/03/2020 01/18/2020  Falls in the past year? 0 0 0  Comment - - -  Number falls in past yr: - - -  Comment - - -  Injury with Fall? - - -  Comment - - -  Risk for fall due to : - - -  Follow up - - -   PHQ 2/9 Scores 11/21/2020 05/03/2020 04/10/2020 08/13/2019  PHQ - 2 Score 0 0 0 0  PHQ- 9 Score - - 3 -    Assessment: Patient continues to  Management chronic conditions and also caring for spouse with assistance of family.   Goals Addressed            This Visit's Progress   . Make and Keep All Appointments       Timeframe:   Long-Range Goal Priority:  High Start Date:  11/21/20                           Expected End Date:   03/01/21                    Follow Up Date 03/01/21    - ask family or friend for a ride - keep a calendar with appointment dates    Why is this important?    Part of staying healthy is seeing the doctor for follow-up care.   If you forget your appointments, there are some things you can do to stay on track.    Notes:     Marland Kitchen Manage My Emotions       Timeframe:  Long-Range Goal Priority:  High Start Date: 11/21/20                            Expected End Date: 03/01/21                      Follow Up Date 03/01/21   - talk about feelings with a friend, family or spiritual advisor - practice positive thinking and self-talk    Why is this important?    When you are stressed, down or upset, your body reacts too.   For example, your blood pressure may get higher; you may have a headache or stomachache.   When your emotions get the best of you, your body's ability to fight off cold and flu gets weak.   These steps will help you manage your emotions.     Notes: History of anxiety    . Track and Manage My Blood Pressure-Hypertension       Timeframe:  Long-Range Goal Priority:  High Start Date:  11/21/20                           Expected End Date:   03/01/21                    Follow Up Date 03/01/21   - check blood pressure weekly - write blood pressure results in a log or diary    Why is this important?    You won't feel high blood pressure, but it can still hurt your blood vessels.   High blood pressure can cause heart or kidney problems. It can also cause a stroke.   Making lifestyle changes like losing a little weight or eating less salt will help.   Checking  your blood pressure at home and at different times of the day can help to control blood pressure.   If the doctor prescribes medicine remember to take it the way the doctor ordered.   Call the office if  you cannot afford the medicine or if there are questions about it.     Notes:        Plan: RN CM will contact in the month of March. Follow-up:  Patient agrees to Care Plan and Follow-up.   Jone Baseman, RN, MSN [facility] Care Management Coordinator Direct Line [phone] Cell [phone] Toll Free: [phone]  Fax: [phone]

## 2020-11-21 NOTE — Patient Instructions (Signed)
Goals Addressed            This Visit's Progress   . Make and Keep All Appointments       Timeframe:  Long-Range Goal Priority:  High Start Date:  11/21/20                           Expected End Date:   03/01/21                    Follow Up Date 03/01/21    - ask family or friend for a ride - keep a calendar with appointment dates    Why is this important?    Part of staying healthy is seeing the doctor for follow-up care.   If you forget your appointments, there are some things you can do to stay on track.    Notes:     Marland Kitchen Manage My Emotions       Timeframe:  Long-Range Goal Priority:  High Start Date: 11/21/20                            Expected End Date: 03/01/21                      Follow Up Date 03/01/21   - talk about feelings with a friend, family or spiritual advisor - practice positive thinking and self-talk    Why is this important?    When you are stressed, down or upset, your body reacts too.   For example, your blood pressure may get higher; you may have a headache or stomachache.   When your emotions get the best of you, your body's ability to fight off cold and flu gets weak.   These steps will help you manage your emotions.     Notes: History of anxiety    . Track and Manage My Blood Pressure-Hypertension       Timeframe:  Long-Range Goal Priority:  High Start Date:  11/21/20                           Expected End Date:   03/01/21                    Follow Up Date 03/01/21   - check blood pressure weekly - write blood pressure results in a log or diary    Why is this important?    You won't feel high blood pressure, but it can still hurt your blood vessels.   High blood pressure can cause heart or kidney problems. It can also cause a stroke.   Making lifestyle changes like losing a little weight or eating less salt will help.   Checking your blood pressure at home and at different times of the day can help to control blood pressure.    If the doctor prescribes medicine remember to take it the way the doctor ordered.   Call the office if you cannot afford the medicine or if there are questions about it.     Notes:

## 2020-11-21 NOTE — Telephone Encounter (Signed)
Spoke with pt. Pt is taking Linzess 72 mcg daily and is having a BM q 1-3 days. Pt strained yesterday 11/20/20 trying to have a bowel movement and felt some pain in her side when pt strained. Pt's pain in her side has stopped since pt had her bowel movement. Discussed with pt increasing Linzess 72 to Linzess 145 mcg if the 72 mcg weren't working per [employer] when pt called previously. Pt would like to try samples of Linzess 145 mcg. If samples work, pt would like to call back for a prescription.

## 2020-11-27 DIAGNOSIS — R21 Rash and other nonspecific skin eruption: Secondary | ICD-10-CM | POA: Diagnosis not present

## 2020-11-27 DIAGNOSIS — L209 Atopic dermatitis, unspecified: Secondary | ICD-10-CM | POA: Diagnosis not present

## 2020-11-27 DIAGNOSIS — R197 Diarrhea, unspecified: Secondary | ICD-10-CM | POA: Diagnosis not present

## 2020-11-27 DIAGNOSIS — R110 Nausea: Secondary | ICD-10-CM | POA: Diagnosis not present

## 2020-11-27 DIAGNOSIS — Z0001 Encounter for general adult medical examination with abnormal findings: Secondary | ICD-10-CM | POA: Diagnosis not present

## 2020-11-27 DIAGNOSIS — G501 Atypical facial pain: Secondary | ICD-10-CM | POA: Diagnosis not present

## 2020-11-27 DIAGNOSIS — L03115 Cellulitis of right lower limb: Secondary | ICD-10-CM | POA: Diagnosis not present

## 2020-11-27 DIAGNOSIS — J029 Acute pharyngitis, unspecified: Secondary | ICD-10-CM | POA: Diagnosis not present

## 2020-11-27 DIAGNOSIS — Z0000 Encounter for general adult medical examination without abnormal findings: Secondary | ICD-10-CM | POA: Diagnosis not present

## 2020-11-27 DIAGNOSIS — M79674 Pain in right toe(s): Secondary | ICD-10-CM | POA: Diagnosis not present

## 2020-12-05 ENCOUNTER — Ambulatory Visit (INDEPENDENT_AMBULATORY_CARE_PROVIDER_SITE_OTHER): Payer: Medicare HMO | Admitting: Podiatry

## 2020-12-05 ENCOUNTER — Other Ambulatory Visit: Payer: Self-pay

## 2020-12-05 DIAGNOSIS — M21619 Bunion of unspecified foot: Secondary | ICD-10-CM | POA: Diagnosis not present

## 2020-12-05 DIAGNOSIS — L84 Corns and callosities: Secondary | ICD-10-CM

## 2020-12-05 NOTE — Patient Instructions (Signed)
At this time there is no open wound. I would use the "miracle foot cream". Continue to wear the supportive shoes. If it were to get sore you can use the black surgical shoe. If there is an openings then use the antibiotic cream.

## 2020-12-06 DIAGNOSIS — Z1283 Encounter for screening for malignant neoplasm of skin: Secondary | ICD-10-CM | POA: Diagnosis not present

## 2020-12-06 DIAGNOSIS — L728 Other follicular cysts of the skin and subcutaneous tissue: Secondary | ICD-10-CM | POA: Diagnosis not present

## 2020-12-06 DIAGNOSIS — D225 Melanocytic nevi of trunk: Secondary | ICD-10-CM | POA: Diagnosis not present

## 2020-12-07 NOTE — Progress Notes (Signed)
Subjective: 80 year old female presents the office today for follow-up evaluation of calluses on her right foot.  She states that there was a day that she walk around [facility] for about 1.5 hours and she points on the bunion where she had increased swelling and inflammation.  She called the office we started her on antibiotics she went to a surgical shoe and symptoms resolved.  Currently states that she has been doing well.  She does file the calluses herself.  Denies any drainage or pus or any swelling or redness at this time. Denies any systemic complaints such as fevers, chills, nausea, vomiting. No acute changes since last appointment, and no other complaints at this time.   Objective: AAO x3, NAD DP/PT pulses palpable bilaterally, CRT less than 3 seconds Hyperkeratotic tissue present medial hallux IPJ and MPJ.  There is no ulcerations identified there is no edema, erythema or signs of infection.  Bunion is present.  There is no fluctuation or crepitation.  There is no malodor. No pain with calf compression, swelling, warmth, erythema  Assessment: Preulcerative callus right foot-primary  Plan: -All treatment options discussed with the patient including all alternatives, risks, complications.  -Calluses of minimal today.  I want her to continue with moisturizer and offloading.  Continue with supportive shoes.  If there is any skin breakdown would recommend going back to mupirocin ointment at this point she appears to be stable without any signs of infection and there is minimal hyperkeratotic tissue.  Return in about 3 months (around 03/05/2021).  Vivi Barrack DPM

## 2020-12-09 DIAGNOSIS — J069 Acute upper respiratory infection, unspecified: Secondary | ICD-10-CM | POA: Diagnosis not present

## 2020-12-19 ENCOUNTER — Encounter (HOSPITAL_COMMUNITY): Payer: Self-pay | Admitting: Internal Medicine

## 2020-12-19 ENCOUNTER — Ambulatory Visit
Admission: EM | Admit: 2020-12-19 | Discharge: 2020-12-19 | Disposition: A | Discharge status: Home / Self Care | Payer: Medicare HMO | Attending: Family Medicine | Admitting: Family Medicine

## 2020-12-19 ENCOUNTER — Ambulatory Visit (INDEPENDENT_AMBULATORY_CARE_PROVIDER_SITE_OTHER): Payer: Medicare HMO

## 2020-12-19 ENCOUNTER — Other Ambulatory Visit: Payer: Self-pay

## 2020-12-19 ENCOUNTER — Other Ambulatory Visit (HOSPITAL_COMMUNITY)
Admission: RE | Admit: 2020-12-19 | Discharge: 2020-12-19 | Disposition: A | Discharge status: Home / Self Care | Payer: Medicare HMO | Source: Ambulatory Visit | Attending: Internal Medicine | Admitting: Internal Medicine

## 2020-12-19 ENCOUNTER — Encounter: Payer: Self-pay | Admitting: Emergency Medicine

## 2020-12-19 DIAGNOSIS — Z20822 Contact with and (suspected) exposure to covid-19: Secondary | ICD-10-CM | POA: Diagnosis not present

## 2020-12-19 DIAGNOSIS — W19XXXA Unspecified fall, initial encounter: Secondary | ICD-10-CM | POA: Diagnosis not present

## 2020-12-19 DIAGNOSIS — Z043 Encounter for examination and observation following other accident: Secondary | ICD-10-CM | POA: Diagnosis not present

## 2020-12-19 DIAGNOSIS — M25551 Pain in right hip: Secondary | ICD-10-CM

## 2020-12-19 DIAGNOSIS — Z01812 Encounter for preprocedural laboratory examination: Secondary | ICD-10-CM | POA: Insufficient documentation

## 2020-12-19 LAB — SARS CORONAVIRUS 2 (TAT 6-24 HRS): SARS Coronavirus 2: NEGATIVE

## 2020-12-19 NOTE — Discharge Instructions (Signed)
Xray is negative today  May take ibuprofen and tylenol as needed for pain  Follow up with PCP or orthopedics if symptoms are persisting  Follow up in the ER for high fever, trouble swallowing, trouble breathing, other concerning symptoms.

## 2020-12-19 NOTE — ED Triage Notes (Signed)
Fell on right side yesterday, pain in rt groin area.

## 2020-12-19 NOTE — ED Provider Notes (Signed)
[facility]   [id number] 12/19/20 Arrival Time: 1143  ZD:GLOVF PAIN  SUBJECTIVE: History from: patient. [name] is a 80 y.o. female complains of right hip pain that began yesterday. Reports that she fell in the ice. Reports that there is also pain to the right upper thigh that is worse with walking. Has taken tylenol with relief last night. Describes the pain as constant and achy in character. Has tried OTC medications with relief. Denies similar symptoms in the past. Denies fever, chills, erythema, ecchymosis, effusion, weakness, numbness and tingling, saddle paresthesias, loss of bowel or bladder function.      ROS: As per HPI.  All other pertinent ROS negative.     Past Medical History:  Diagnosis Date  . Anemia   . Anxiety   . Arthritis   . Asthma   . Constipation   . GERD (gastroesophageal reflux disease)   . High blood pressure   . Hypothyroidism   . Seasonal allergies   . Thyroid disease   . Varicose veins   . Vertigo 11/23/2013   Past Surgical History:  Procedure Laterality Date  . APPENDECTOMY    . CATARACT EXTRACTION W/PHACO Left 01/27/2017   Procedure: CATARACT EXTRACTION PHACO AND INTRAOCULAR LENS PLACEMENT (IOC);  Surgeon: Tonny Branch, MD;  Location: [facility];  Service: Ophthalmology;  Laterality: Left;  CDE: 29.90  . CATARACT EXTRACTION W/PHACO Right 02/24/2017   Procedure: CATARACT EXTRACTION PHACO AND INTRAOCULAR LENS PLACEMENT (IOC);  Surgeon: Tonny Branch, MD;  Location: [facility];  Service: Ophthalmology;  Laterality: Right;  CDE: 8.57  . CHOLECYSTECTOMY    . COLONOSCOPY N/A 10/01/2016   Procedure: COLONOSCOPY;  Surgeon: Daneil Dolin, MD; pancolonic diverticulosis, melanosis coli, otherwise normal exam.  No recommendations to repeat colonoscopy.  Marland Kitchen FLEXOR TENDON REPAIR Right 11/06/2017   Procedure: RIGHT WRIST FLEXOR TENDON REPAIRAND STT [place];  Surgeon: Leanora Cover, MD;  Location: [facility];  Service: Orthopedics;   Laterality: Right;  . TUBAL LIGATION     Allergies  Allergen Reactions  . Amoxicillin-Pot Clavulanate Nausea And Vomiting, Other (See Comments), Itching and Nausea Only    Has patient had a PCN reaction causing immediate rash, facial/tongue/throat swelling, SOB or lightheadedness with hypotension: No Has patient had a PCN reaction causing severe rash involving mucus membranes or skin necrosis: No Has patient had a PCN reaction that required hospitalization: No Has patient had a PCN reaction occurring within the last 10 years: No If all of the above answers are "NO", then may proceed with Cephalosporin use. Has patient had a PCN reaction causing immediate rash, facial/tongue/throat swelling, SOB or lightheadedness with hypotension: No Has patient had a PCN reaction causing severe rash involving mucus membranes or skin necrosis: No Has patient had a PCN reaction that required hospitalization: No Has patient had a PCN reaction occurring within the last 10 years: No If all of the above answers are "NO", then may proceed with Cephalosporin use.   . Ace Inhibitors Cough  . Entex La Itching  . Levofloxacin Hives and Itching  . Meloxicam Other (See Comments)    (MOBIC) Unknown reaction type (maybe nausea?)   No current facility-administered medications on file prior to encounter.   Current Outpatient Medications on File Prior to Encounter  Medication Sig Dispense Refill  . acetaminophen (TYLENOL) 500 MG tablet Take 500 mg by mouth every 6 (six) hours as needed (pain.).    Marland Kitchen albuterol (VENTOLIN HFA) 108 (90 Base) MCG/ACT inhaler Inhale 2 puffs into  the lungs every 6 (six) hours as needed for wheezing or shortness of breath.    . ALPRAZolam (XANAX) 0.5 MG tablet Take 0.5 mg by mouth in the morning, at noon, and at bedtime. (1000, 1500, AT BEDTIME)    . amLODipine (NORVASC) 2.5 MG tablet Take 2.5 mg by mouth every evening.     Marland Kitchen aspirin EC 81 MG tablet Take 81 mg by mouth every evening.    .  chlorhexidine (PERIDEX) 0.12 % solution Use as directed 15 mLs in the mouth or throat 2 (two) times daily as needed (mouth ulcers).    . doxycycline (VIBRA-TABS) 100 MG tablet Take 1 tablet (100 mg total) by mouth 2 (two) times daily. (Patient not taking: Reported on 12/15/2020) 20 tablet 0  . fluocinonide gel (LIDEX) [id number] % Apply 1 application topically 3 (three) times daily as needed (ulcers.).    Marland Kitchen fluticasone (FLONASE) 50 MCG/ACT nasal spray Place 2 sprays into both nostrils daily as needed for allergies.    Marland Kitchen levothyroxine (SYNTHROID) 50 MCG tablet Take 1 tablet (50 mcg total) by mouth daily. (Patient taking differently: Take 50 mcg by mouth daily at 6 (six) AM.) 90 tablet 3  . linaclotide (LINZESS) 72 MCG capsule Take 1 capsule (72 mcg total) by mouth daily before breakfast. 30 capsule 5  . losartan (COZAAR) 25 MG tablet Take 25 mg by mouth daily. IN THE MORNING.    . Multiple Vitamin (MULTIVITAMIN WITH MINERALS) TABS tablet Take 1 tablet by mouth daily in the afternoon.    . mupirocin ointment (BACTROBAN) 2 % Apply 1 application topically daily. (Patient taking differently: Apply 1 application topically daily as needed (wound care).) 30 g 2  . NONFORMULARY OR COMPOUNDED ITEM Apply 1 application topically 3 (three) times daily as needed (foot neuropathy). neuropathy cream dic 3%/bac 2%/gab %/lid 5%/meth 1%    . ondansetron (ZOFRAN) 4 MG tablet Take 4 mg by mouth every 8 (eight) hours as needed for nausea or vomiting.    . pantoprazole (PROTONIX) 40 MG tablet Take 1 tablet (40 mg total) by mouth daily. (Patient not taking: Reported on 12/15/2020) 30 tablet 3  . PREVIDENT 5000 BOOSTER PLUS 1.1 % PSTE Place 1 application onto teeth at bedtime.    . raloxifene (EVISTA) 60 MG tablet Take 1 tablet (60 mg total) by mouth every morning. (Patient taking differently: Take 60 mg by mouth every other day. IN THE MORNING) 30 tablet 12  . SYMBICORT 80-4.5 MCG/ACT inhaler INHALE 2 PUFFS INTO THE LUNGS TWICE  DAILY. (Patient taking differently: Inhale 2 puffs into the lungs daily.) 10.2 g 0  . Turmeric 500 MG TABS Take 500 mg by mouth daily with lunch.    . venlafaxine XR (EFFEXOR-XR) 37.5 MG 24 hr capsule Take 37.5 mg by mouth daily with breakfast.      Social History   Socioeconomic History  . Marital status: Married    Spouse name: Not on file  . Number of children: Not on file  . Years of education: Not on file  . Highest education level: Not on file  Occupational History  . Not on file  Tobacco Use  . Smoking status: Former Smoker    Packs/day: 0.25    Years: 5.00    Pack years: 1.25    Types: Cigarettes    Quit date: 12/02/1997    Years since quitting: 23.0  . Smokeless tobacco: Never Used  Vaping Use  . Vaping Use: Never used  Substance and Sexual Activity  .  Alcohol use: Not Currently    Alcohol/week: 1.0 standard drink    Types: 1 Glasses of wine per week    Comment: occasional wine  . Drug use: No  . Sexual activity: Yes    Birth control/protection: Surgical    Comment: tubal  Other Topics Concern  . Not on file  Social History Narrative  . Not on file   Social Determinants of Health   Financial Resource Strain: Low Risk   . Difficulty of Paying Living Expenses: Not hard at all  Food Insecurity: No Food Insecurity  . Worried About Charity fundraiser in the Last Year: Never true  . Ran Out of Food in the Last Year: Never true  Transportation Needs: No Transportation Needs  . Lack of Transportation (Medical): No  . Lack of Transportation (Non-Medical): No  Physical Activity: Inactive  . Days of Exercise per Week: 0 days  . Minutes of Exercise per Session: 0 min  Stress: Stress Concern Present  . Feeling of Stress : To some extent  Social Connections: Socially Integrated  . Frequency of Communication with Friends and Family: More than three times a week  . Frequency of Social Gatherings with Friends and Family: More than three times a week  . Attends  Religious Services: More than 4 times per year  . Active Member of Clubs or Organizations: Yes  . Attends Archivist Meetings: More than 4 times per year  . Marital Status: Married  Human resources officer Violence: Not At Risk  . Fear of Current or Ex-Partner: No  . Emotionally Abused: No  . Physically Abused: No  . Sexually Abused: No   Family History  Problem Relation Age of Onset  . Hypertension Mother   . Varicose Veins Father   . Heart attack Father   . Heart disease Father   . Hypertension Brother   . COPD Brother     OBJECTIVE:  Vitals:   12/19/20 1207 12/19/20 1208  BP: 118/73   Pulse: 75   Resp: 18   Temp: 98.4 F (36.9 C)   TempSrc: Oral   SpO2: 97%   Weight:  130 lb (59 kg)  Height:  5\' 4"  (1.626 m)    General appearance: ALERT; in no acute distress.  Head: NCAT Lungs: Normal respiratory effort CV: pulses 2+ bilaterally. Cap refill < 2 seconds Musculoskeletal:  Inspection: Skin warm, dry, clear and intact No erythema, effusion to right hip Palpation: R hip and groin tender to palpation ROM: Limited ROM active and passive to R hip Skin: warm and dry Neurologic: Ambulates without difficulty; Sensation intact about the upper/ lower extremities Psychological: alert and cooperative; normal mood and affect  DIAGNOSTIC STUDIES:  DG Hip Unilat W or Wo Pelvis 2-3 Views Right  Result Date: 12/19/2020 CLINICAL DATA:  Fall yesterday EXAM: DG HIP (WITH OR WITHOUT PELVIS) 2-3V RIGHT COMPARISON:  None. FINDINGS: There is no evidence of hip fracture or dislocation. There is no evidence of arthropathy or other focal bone abnormality. IMPRESSION: Negative. Electronically Signed   By: Franchot Gallo M.D.   On: 12/19/2020 12:28     ASSESSMENT & PLAN:  1. Right hip pain    Xray shows no fracture or dislocation Continue conservative management of rest, ice, and gentle stretches Take tylenol as needed for pain relief  Follow up with PCP if symptoms persist Return  or go to the ER if you have any new or worsening symptoms (fever, chills, chest pain, abdominal pain, changes  in bowel or bladder habits, pain radiating into lower legs)   Reviewed expectations re: course of current medical issues. Questions answered. Outlined signs and symptoms indicating need for more acute intervention. Patient verbalized understanding. After Visit Summary given.       Faustino Congress, NP 12/20/20 [phone]

## 2020-12-20 ENCOUNTER — Telehealth: Payer: Self-pay | Admitting: Internal Medicine

## 2020-12-20 NOTE — Telephone Encounter (Signed)
Called pt, questions answered. °

## 2020-12-20 NOTE — Telephone Encounter (Signed)
[place] PATIENT ABOUT HER PREP, SHE HAS A FEW QUESTIONS

## 2020-12-21 ENCOUNTER — Other Ambulatory Visit: Payer: Self-pay

## 2020-12-21 ENCOUNTER — Ambulatory Visit (HOSPITAL_COMMUNITY): Payer: Medicare HMO | Admitting: Anesthesiology

## 2020-12-21 ENCOUNTER — Encounter (HOSPITAL_COMMUNITY): Payer: Self-pay | Admitting: Internal Medicine

## 2020-12-21 ENCOUNTER — Encounter (HOSPITAL_COMMUNITY)
Admission: RE | Disposition: A | Discharge status: Home / Self Care | Payer: Self-pay | Source: Home / Self Care | Attending: Internal Medicine

## 2020-12-21 ENCOUNTER — Ambulatory Visit (HOSPITAL_COMMUNITY)
Admission: RE | Admit: 2020-12-21 | Discharge: 2020-12-21 | Disposition: A | Discharge status: Home / Self Care | Payer: Medicare HMO | Attending: Internal Medicine | Admitting: Internal Medicine

## 2020-12-21 DIAGNOSIS — Z7951 Long term (current) use of inhaled steroids: Secondary | ICD-10-CM | POA: Insufficient documentation

## 2020-12-21 DIAGNOSIS — Q438 Other specified congenital malformations of intestine: Secondary | ICD-10-CM | POA: Diagnosis not present

## 2020-12-21 DIAGNOSIS — Z79899 Other long term (current) drug therapy: Secondary | ICD-10-CM | POA: Insufficient documentation

## 2020-12-21 DIAGNOSIS — K259 Gastric ulcer, unspecified as acute or chronic, without hemorrhage or perforation: Secondary | ICD-10-CM | POA: Diagnosis not present

## 2020-12-21 DIAGNOSIS — K3189 Other diseases of stomach and duodenum: Secondary | ICD-10-CM | POA: Diagnosis not present

## 2020-12-21 DIAGNOSIS — R194 Change in bowel habit: Secondary | ICD-10-CM | POA: Diagnosis not present

## 2020-12-21 DIAGNOSIS — R110 Nausea: Secondary | ICD-10-CM | POA: Insufficient documentation

## 2020-12-21 DIAGNOSIS — K319 Disease of stomach and duodenum, unspecified: Secondary | ICD-10-CM | POA: Insufficient documentation

## 2020-12-21 DIAGNOSIS — K2100 Gastro-esophageal reflux disease with esophagitis, without bleeding: Secondary | ICD-10-CM | POA: Diagnosis not present

## 2020-12-21 DIAGNOSIS — K209 Esophagitis, unspecified without bleeding: Secondary | ICD-10-CM

## 2020-12-21 DIAGNOSIS — K5730 Diverticulosis of large intestine without perforation or abscess without bleeding: Secondary | ICD-10-CM | POA: Diagnosis not present

## 2020-12-21 DIAGNOSIS — Z87891 Personal history of nicotine dependence: Secondary | ICD-10-CM | POA: Diagnosis not present

## 2020-12-21 DIAGNOSIS — R630 Anorexia: Secondary | ICD-10-CM | POA: Diagnosis not present

## 2020-12-21 DIAGNOSIS — Z7982 Long term (current) use of aspirin: Secondary | ICD-10-CM | POA: Insufficient documentation

## 2020-12-21 DIAGNOSIS — R1011 Right upper quadrant pain: Secondary | ICD-10-CM | POA: Insufficient documentation

## 2020-12-21 DIAGNOSIS — Z79810 Long term (current) use of selective estrogen receptor modulators (SERMs): Secondary | ICD-10-CM | POA: Diagnosis not present

## 2020-12-21 DIAGNOSIS — Z79890 Hormone replacement therapy (postmenopausal): Secondary | ICD-10-CM | POA: Diagnosis not present

## 2020-12-21 DIAGNOSIS — Z6822 Body mass index (BMI) 22.0-22.9, adult: Secondary | ICD-10-CM | POA: Diagnosis not present

## 2020-12-21 DIAGNOSIS — R634 Abnormal weight loss: Secondary | ICD-10-CM | POA: Insufficient documentation

## 2020-12-21 HISTORY — PX: COLONOSCOPY WITH PROPOFOL: SHX5780

## 2020-12-21 HISTORY — PX: ESOPHAGOGASTRODUODENOSCOPY (EGD) WITH PROPOFOL: SHX5813

## 2020-12-21 HISTORY — PX: BIOPSY: SHX5522

## 2020-12-21 SURGERY — COLONOSCOPY WITH PROPOFOL
Anesthesia: General

## 2020-12-21 MED ORDER — LIDOCAINE VISCOUS HCL 2 % MT SOLN: 15.0000 mL | Freq: Once | OROMUCOSAL | Status: DC

## 2020-12-21 MED ORDER — PROPOFOL 500 MG/50ML IV EMUL
INTRAVENOUS | Status: DC | PRN
  Administered 2020-12-21: 150 ug/kg/min via INTRAVENOUS

## 2020-12-21 MED ORDER — PROPOFOL 10 MG/ML IV BOLUS
INTRAVENOUS | Status: DC | PRN
  Administered 2020-12-21 (×2): 20 mg via INTRAVENOUS
  Administered 2020-12-21: 40 mg via INTRAVENOUS
  Administered 2020-12-21: 20 mg via INTRAVENOUS
  Administered 2020-12-21 (×3): 40 mg via INTRAVENOUS

## 2020-12-21 MED ORDER — LACTATED RINGERS IV SOLN: INTRAVENOUS | Status: DC

## 2020-12-21 MED ORDER — STERILE WATER FOR IRRIGATION IR SOLN
Status: DC | PRN
  Administered 2020-12-21: 200 mL

## 2020-12-21 NOTE — Op Note (Signed)
[facility] Patient Name: [name] Procedure Date: 12/21/2020 10:31 AM MRN: [id number] Date of Birth: Apr 15, 1941 Attending MD: Norvel Richards , MD CSN: [id number] Age: 80 Admit Type: Outpatient Procedure:                Colonoscopy Indications:              Change in bowel habits Providers:                Norvel Richards, MD, Lambert Mody,                            Randa Spike, Technician Referring MD:              Medicines:                Propofol per Anesthesia Complications:            No immediate complications. Estimated Blood Loss:     Estimated blood loss: none. Procedure:                Pre-Anesthesia Assessment:                           - Prior to the procedure, a History and Physical                            was performed, and patient medications and                            allergies were reviewed. The patient's tolerance of                            previous anesthesia was also reviewed. The risks                            and benefits of the procedure and the sedation                            options and risks were discussed with the patient.                            All questions were answered, and informed consent                            was obtained. Prior Anticoagulants: The patient has                            taken no previous anticoagulant or antiplatelet                            agents. ASA Grade Assessment: III - A patient with                            severe systemic disease. After reviewing the risks  and benefits, the patient was deemed in                            satisfactory condition to undergo the procedure.                           After obtaining informed consent, the colonoscope                            was passed under direct vision. Throughout the                            procedure, the patient's blood pressure, pulse, and                            oxygen  saturations were monitored continuously. The                            [id number]-[id number] ([id number]) scope was introduced through                            the anus and advanced to the the cecum, identified                            by appendiceal orifice and ileocecal valve. The                            colonoscopy was performed without difficulty. The                            patient tolerated the procedure well. The quality                            of the bowel preparation was adequate. Scope In: 10:36:22 AM Scope Out: 10:51:48 AM Scope Withdrawal Time: 0 hours 6 minutes 14 seconds  Total Procedure Duration: 0 hours 15 minutes 26 seconds  Findings:      Scattered medium-mouthed diverticula were found in the entire colon.       Redundant and elongated colon requiring external abdominal pressure to       reach the cecum.      The exam was otherwise without abnormality on direct and retroflexion       views. Impression:               - Diverticulosis in the entire examined colon.                            Redundant colon                           - The examination was otherwise normal on direct                            and retroflexion views.                           -  No specimens collected. Moderate Sedation:      Moderate (conscious) sedation was personally administered by an       anesthesia professional. The following parameters were monitored: oxygen       saturation, heart rate, blood pressure, respiratory rate, EKG, adequacy       of pulmonary ventilation, and response to care. Recommendation:           - Patient has a contact number available for                            emergencies. The signs and symptoms of potential                            delayed complications were discussed with the                            patient. Return to normal activities tomorrow.                            Written discharge instructions were provided to the                             patient.                           - Resume previous diet.                           - Continue present medications. However, increase                            Linzess to 145 daily. 72 has helped bowel                            function?"having a bowel movement every other day.                            Patient feels he could be a bit better.                           - No repeat colonoscopy due to age.                           - Return to GI clinic in 6 weeks. See EGD report. Procedure Code(s):        --- Professional ---                           [phone], Colonoscopy, flexible; diagnostic, including                            collection of specimen(s) by brushing or washing,                            when performed (separate procedure) Diagnosis Code(s):        --- Professional ---  R19.4, Change in bowel habit                           K57.30, Diverticulosis of large intestine without                            perforation or abscess without bleeding CPT copyright 2019 [employer]. All rights reserved. The codes documented in this report are preliminary and upon coder review may  be revised to meet current compliance requirements. Cristopher Estimable. [name], MD Norvel Richards, MD 12/21/2020 11:28:02 AM This report has been signed electronically. Number of Addenda: 0

## 2020-12-21 NOTE — Op Note (Signed)
[facility] Patient Name: [name] Procedure Date: 12/21/2020 9:36 AM MRN: [id number] Date of Birth: Oct 04, 1941 Attending MD: Norvel Richards , MD CSN: [id number] Age: 80 Admit Type: Outpatient Procedure:                Upper GI endoscopy Indications:              Abdominal pain in the right upper quadrant, Anorexia Providers:                Norvel Richards, MD, Lambert Mody,                            Randa Spike, Technician Referring MD:              Medicines:                Propofol per Anesthesia Complications:            No immediate complications. Estimated Blood Loss:     Estimated blood loss was minimal. Procedure:                Pre-Anesthesia Assessment:                           - Prior to the procedure, a History and Physical                            was performed, and patient medications and                            allergies were reviewed. The patient's tolerance of                            previous anesthesia was also reviewed. The risks                            and benefits of the procedure and the sedation                            options and risks were discussed with the patient.                            All questions were answered, and informed consent                            was obtained. Prior Anticoagulants: The patient has                            taken no previous anticoagulant or antiplatelet                            agents. ASA Grade Assessment: III - A patient with                            severe systemic disease. After reviewing the risks  and benefits, the patient was deemed in                            satisfactory condition to undergo the procedure.                           After obtaining informed consent, the endoscope was                            passed under direct vision. Throughout the                            procedure, the patient's blood pressure, pulse, and                             oxygen saturations were monitored continuously. The                            [id number]) was introduced through the mouth,                            and advanced to the second part of duodenum. The                            upper GI endoscopy was accomplished without                            difficulty. The patient tolerated the procedure                            well. Scope In: 10:23:20 AM Scope Out: 10:28:16 AM Total Procedure Duration: 0 hours 4 minutes 56 seconds  Findings:      Erosive reflux esophagitis (circumferential distal esophageal erosions       within 5 mm of the GE junction. No Barrett's epithelium seen). (1) 4 mm       cratered antral ulcer; stomach otherwise appeared normal. Patent pylorus.      The duodenal bulb and second portion of the duodenum were normal.       Biopsies of the antral ulcer taken for histologic study Impression:               - Erosive reflux esophagitis                           - Non-bleeding gastric ulcers with no stigmata of                            bleeding. Biopsied.                           - Normal duodenal bulb and second portion of the                            duodenum. Moderate Sedation:      Moderate (conscious) sedation was personally administered by an  anesthesia professional. The following parameters were monitored: oxygen       saturation, heart rate, blood pressure, respiratory rate, EKG, adequacy       of pulmonary ventilation, and response to care. Recommendation:           - Await pathology results.                           - Repeat upper endoscopy in 3 months to check                            healing. Begin Protonix 40 mg twice daily. Avoid                            NSAIDs. See colonoscopy report. Procedure Code(s):        --- Professional ---                           [phone], Esophagogastroduodenoscopy, flexible,                            transoral; with biopsy, single or  multiple Diagnosis Code(s):        --- Professional ---                           K20.90, Esophagitis, unspecified without bleeding                           K25.9, Gastric ulcer, unspecified as acute or                            chronic, without hemorrhage or perforation                           R10.11, Right upper quadrant pain                           R63.0, Anorexia CPT copyright 2019 [employer]. All rights reserved. The codes documented in this report are preliminary and upon coder review may  be revised to meet current compliance requirements. Cristopher Estimable. Barbette Mcglaun, MD Norvel Richards, MD 12/21/2020 11:24:22 AM This report has been signed electronically. Number of Addenda: 0

## 2020-12-21 NOTE — Anesthesia Postprocedure Evaluation (Signed)
Anesthesia Post Note  Patient: [name]  Procedure(s) Performed: COLONOSCOPY WITH PROPOFOL (N/A ) ESOPHAGOGASTRODUODENOSCOPY (EGD) WITH PROPOFOL (N/A ) BIOPSY  Patient location during evaluation: Endoscopy Anesthesia Type: General Level of consciousness: awake and oriented Pain management: pain level controlled Vital Signs Assessment: post-procedure vital signs reviewed and stable Respiratory status: spontaneous breathing Cardiovascular status: blood pressure returned to baseline and stable Postop Assessment: no apparent nausea or vomiting Anesthetic complications: no   No complications documented.   Last Vitals:  Vitals:   12/21/20 0846 12/21/20 1059  BP: (!) 143/84 (!) 97/59  Pulse: 88 73  Resp: 20 13  Temp: 36.7 C 36.8 C  SpO2: 92% 98%    Last Pain:  Vitals:   12/21/20 1059  TempSrc: Oral  PainSc: 0-No pain                 DANIEL,KAREN

## 2020-12-21 NOTE — Discharge Instructions (Signed)
EGD Discharge instructions Please read the instructions outlined below and refer to this sheet in the next few weeks. These discharge instructions provide you with general information on caring for yourself after you leave the hospital. Your doctor may also give you specific instructions. While your treatment has been planned according to the most current medical practices available, unavoidable complications occasionally occur. If you have any problems or questions after discharge, please call your doctor. ACTIVITY  You may resume your regular activity but move at a slower pace for the next 24 hours.   Take frequent rest periods for the next 24 hours.   Walking will help expel (get rid of) the air and reduce the bloated feeling in your abdomen.   No driving for 24 hours (because of the anesthesia (medicine) used during the test).   You may shower.   Do not sign any important legal documents or operate any machinery for 24 hours (because of the anesthesia used during the test).  NUTRITION  Drink plenty of fluids.   You may resume your normal diet.   Begin with a light meal and progress to your normal diet.   Avoid alcoholic beverages for 24 hours or as instructed by your caregiver.  MEDICATIONS  You may resume your normal medications unless your caregiver tells you otherwise.  WHAT YOU CAN EXPECT TODAY  You may experience abdominal discomfort such as a feeling of fullness or "gas" pains.  FOLLOW-UP  Your doctor will discuss the results of your test with you.  SEEK IMMEDIATE MEDICAL ATTENTION IF ANY OF THE FOLLOWING OCCUR:  Excessive nausea (feeling sick to your stomach) and/or vomiting.   Severe abdominal pain and distention (swelling).   Trouble swallowing.   Temperature over 101 F (37.8 C).   Rectal bleeding or vomiting of blood.    Colonoscopy Discharge Instructions  Read the instructions outlined below and refer to this sheet in the next few weeks. These  discharge instructions provide you with general information on caring for yourself after you leave the hospital. Your doctor may also give you specific instructions. While your treatment has been planned according to the most current medical practices available, unavoidable complications occasionally occur. If you have any problems or questions after discharge, call Dr. Gala Romney at [phone]. ACTIVITY  You may resume your regular activity, but move at a slower pace for the next 24 hours.   Take frequent rest periods for the next 24 hours.   Walking will help get rid of the air and reduce the bloated feeling in your belly (abdomen).   No driving for 24 hours (because of the medicine (anesthesia) used during the test).    Do not sign any important legal documents or operate any machinery for 24 hours (because of the anesthesia used during the test).  NUTRITION  Drink plenty of fluids.   You may resume your normal diet as instructed by your doctor.   Begin with a light meal and progress to your normal diet. Heavy or fried foods are harder to digest and may make you feel sick to your stomach (nauseated).   Avoid alcoholic beverages for 24 hours or as instructed.  MEDICATIONS  You may resume your normal medications unless your doctor tells you otherwise.  WHAT YOU CAN EXPECT TODAY  Some feelings of bloating in the abdomen.   Passage of more gas than usual.   Spotting of blood in your stool or on the toilet paper.  IF YOU HAD POLYPS REMOVED DURING THE  COLONOSCOPY:  No aspirin products for 7 days or as instructed.   No alcohol for 7 days or as instructed.   Eat a soft diet for the next 24 hours.  FINDING OUT THE RESULTS OF YOUR TEST Not all test results are available during your visit. If your test results are not back during the visit, make an appointment with your caregiver to find out the results. Do not assume everything is normal if you have not heard from your caregiver or the  medical facility. It is important for you to follow up on all of your test results.  SEEK IMMEDIATE MEDICAL ATTENTION IF:  You have more than a spotting of blood in your stool.   Your belly is swollen (abdominal distention).   You are nauseated or vomiting.   You have a temperature over 101.   You have abdominal pain or discomfort that is severe or gets worse throughout the day.   You have acid reflux burns in your esophagus.  You have a small stomach ulcer - it was biopsied  Resume Protonix 40 mg twice daily prescription provided   Your colonoscopy revealed diverticulosis.  No tumor or polyps.  A future colonoscopy is not recommended  Begin Linzess 145 daily.  Prescription provided  Office visit with [place] in 6 weeks  Further recommendations to follow once I get ulcer biopsy back for review  At patient request, I called Jason Coop at [phone] and reviewed results   Gastroesophageal Reflux Disease, Adult  Gastroesophageal reflux (GER) happens when acid from the stomach flows up into the tube that connects the mouth and the stomach (esophagus). Normally, food travels down the esophagus and stays in the stomach to be digested. With GER, food and stomach acid sometimes move back up into the esophagus. You may have a disease called gastroesophageal reflux disease (GERD) if the reflux:  Happens often.  Causes frequent or very bad symptoms.  Causes problems such as damage to the esophagus. When this happens, the esophagus becomes sore and swollen. Over time, GERD can make small holes (ulcers) in the lining of the esophagus. What are the causes? This condition is caused by a problem with the muscle between the esophagus and the stomach. When this muscle is weak or not normal, it does not close properly to keep food and acid from coming back up from the stomach. The muscle can be weak because of:  Tobacco use.  Pregnancy.  Having a certain type of hernia (hiatal  hernia).  Alcohol use.  Certain foods and drinks, such as coffee, chocolate, onions, and peppermint. What increases the risk?  Being overweight.  Having a disease that affects your connective tissue.  Taking NSAIDs, such a ibuprofen. What are the signs or symptoms?  Heartburn.  Difficult or painful swallowing.  The feeling of having a lump in the throat.  A bitter taste in the mouth.  Bad breath.  Having a lot of saliva.  Having an upset or bloated stomach.  Burping.  Chest pain. Different conditions can cause chest pain. Make sure you see your doctor if you have chest pain.  Shortness of breath or wheezing.  A long-term cough or a cough at night.  Wearing away of the surface of teeth (tooth enamel).  Weight loss. How is this treated?  Making changes to your diet.  Taking medicine.  Having surgery. Treatment will depend on how bad your symptoms are. Follow these instructions at home: Eating and drinking  Follow a diet as  told by your doctor. You may need to avoid foods and drinks such as: ? Coffee and tea, with or without caffeine. ? Drinks that contain alcohol. ? Energy drinks and sports drinks. ? Bubbly (carbonated) drinks or sodas. ? Chocolate and cocoa. ? Peppermint and mint flavorings. ? Garlic and onions. ? Horseradish. ? Spicy and acidic foods. These include peppers, chili powder, curry powder, vinegar, hot sauces, and BBQ sauce. ? Citrus fruit juices and citrus fruits, such as oranges, lemons, and limes. ? Tomato-based foods. These include red sauce, chili, salsa, and pizza with red sauce. ? Fried and fatty foods. These include donuts, french fries, potato chips, and high-fat dressings. ? High-fat meats. These include hot dogs, rib eye steak, sausage, ham, and bacon. ? High-fat dairy items, such as whole milk, butter, and cream cheese.  Eat small meals often. Avoid eating large meals.  Avoid drinking large amounts of liquid with your  meals.  Avoid eating meals during the 2-3 hours before bedtime.  Avoid lying down right after you eat.  Do not exercise right after you eat.   Lifestyle  Do not smoke or use any products that contain nicotine or tobacco. If you need help quitting, ask your doctor.  Try to lower your stress. If you need help doing this, ask your doctor.  If you are overweight, lose an amount of weight that is healthy for you. Ask your doctor about a safe weight loss goal.   General instructions  Pay attention to any changes in your symptoms.  Take over-the-counter and prescription medicines only as told by your doctor.  Do not take aspirin, ibuprofen, or other NSAIDs unless your doctor says it is okay.  Wear loose clothes. Do not wear anything tight around your waist.  Raise (elevate) the head of your bed about 6 inches (15 cm). You may need to use a wedge to do this.  Avoid bending over if this makes your symptoms worse.  Keep all follow-up visits. Contact a doctor if:  You have new symptoms.  You lose weight and you do not know why.  You have trouble swallowing or it hurts to swallow.  You have wheezing or a cough that keeps happening.  You have a hoarse voice.  Your symptoms do not get better with treatment. Get help right away if:  You have sudden pain in your arms, neck, jaw, teeth, or back.  You suddenly feel sweaty, dizzy, or light-headed.  You have chest pain or shortness of breath.  You vomit and the vomit is green, yellow, or black, or it looks like blood or coffee grounds.  You faint.  Your poop (stool) is red, bloody, or black.  You cannot swallow, drink, or eat. These symptoms may represent a serious problem that is an emergency. Do not wait to see if the symptoms will go away. Get medical help right away. Call your local emergency services (911 in the [place] Do not drive yourself to the hospital. Summary  If a person has gastroesophageal reflux disease (GERD),  food and stomach acid move back up into the esophagus and cause symptoms or problems such as damage to the esophagus.  Treatment will depend on how bad your symptoms are.  Follow a diet as told by your doctor.  Take all medicines only as told by your doctor. This information is not intended to replace advice given to you by your health care provider. Make sure you discuss any questions you have with your health care provider.  Document Revised: 05/29/2020 Document Reviewed: 05/29/2020 Elsevier Patient Education  2021 [place].    Diverticulosis  Diverticulosis is a condition that develops when small pouches (diverticula) form in the wall of the large intestine (colon). The colon is where water is absorbed and stool (feces) is formed. The pouches form when the inside layer of the colon pushes through weak spots in the outer layers of the colon. You may have a few pouches or many of them. The pouches usually do not cause problems unless they become inflamed or infected. When this happens, the condition is called diverticulitis. What are the causes? The cause of this condition is not known. What increases the risk? The following factors may make you more likely to develop this condition:  Being older than age 55. Your risk for this condition increases with age. Diverticulosis is rare among people younger than age 74. By age 39, many people have it.  Eating a low-fiber diet.  Having frequent constipation.  Being overweight.  Not getting enough exercise.  Smoking.  Taking over-the-counter pain medicines, like aspirin and ibuprofen.  Having a family history of diverticulosis. What are the signs or symptoms? In most people, there are no symptoms of this condition. If you do have symptoms, they may include:  Bloating.  Cramps in the abdomen.  Constipation or diarrhea.  Pain in the lower left side of the abdomen. How is this diagnosed? Because diverticulosis usually has no  symptoms, it is most often diagnosed during an exam for other colon problems. The condition may be diagnosed by:  Using a flexible scope to examine the colon (colonoscopy).  Taking an X-ray of the colon after dye has been put into the colon (barium enema).  Having a CT scan. How is this treated? You may not need treatment for this condition. Your health care provider may recommend treatment to prevent problems. You may need treatment if you have symptoms or if you previously had diverticulitis. Treatment may include:  Eating a high-fiber diet.  Taking a fiber supplement.  Taking a live bacteria supplement (probiotic).  Taking medicine to relax your colon.   Follow these instructions at home: Medicines  Take over-the-counter and prescription medicines only as told by your health care provider.  If told by your health care provider, take a fiber supplement or probiotic. Constipation prevention Your condition may cause constipation. To prevent or treat constipation, you may need to:  Drink enough fluid to keep your urine pale yellow.  Take over-the-counter or prescription medicines.  Eat foods that are high in fiber, such as beans, whole grains, and fresh fruits and vegetables.  Limit foods that are high in fat and processed sugars, such as fried or sweet foods.   General instructions  Try not to strain when you have a bowel movement.  Keep all follow-up visits as told by your health care provider. This is important. Contact a health care provider if you:  Have pain in your abdomen.  Have bloating.  Have cramps.  Have not had a bowel movement in 3 days. Get help right away if:  Your pain gets worse.  Your bloating becomes very bad.  You have a fever or chills, and your symptoms suddenly get worse.  You vomit.  You have bowel movements that are bloody or black.  You have bleeding from your rectum. Summary  Diverticulosis is a condition that develops when  small pouches (diverticula) form in the wall of the large intestine (colon).  You may have  a few pouches or many of them.  This condition is most often diagnosed during an exam for other colon problems.  Treatment may include increasing the fiber in your diet, taking supplements, or taking medicines. This information is not intended to replace advice given to you by your health care provider. Make sure you discuss any questions you have with your health care provider. Document Revised: 06/17/2019 Document Reviewed: 06/17/2019 Elsevier Patient Education  [place].

## 2020-12-21 NOTE — Anesthesia Preprocedure Evaluation (Signed)
Anesthesia Evaluation  Patient identified by MRN, date of birth, ID band Patient awake    Reviewed: Allergy & Precautions, NPO status , Patient's Chart, lab work & pertinent test results  History of Anesthesia Complications Negative for: history of anesthetic complications  Airway        Dental  (+) Dental Advisory Given   Pulmonary asthma , former smoker,    Pulmonary exam normal breath sounds clear to auscultation       Cardiovascular hypertension, Pt. on medications Normal cardiovascular exam Rhythm:Regular Rate:Normal     Neuro/Psych Anxiety    GI/Hepatic GERD  ,  Endo/Other  Hypothyroidism   Renal/GU      Musculoskeletal  (+) Arthritis ,   Abdominal   Peds  Hematology  (+) anemia ,   Anesthesia Other Findings   Reproductive/Obstetrics                             Anesthesia Physical Anesthesia Plan  ASA: II  Anesthesia Plan: General   Post-op Pain Management:    Induction: Intravenous  PONV Risk Score and Plan: TIVA and Propofol infusion  Airway Management Planned: Nasal Cannula and Natural Airway  Additional Equipment:   Intra-op Plan:   Post-operative Plan:   Informed Consent: I have reviewed the patients History and Physical, chart, labs and discussed the procedure including the risks, benefits and alternatives for the proposed anesthesia with the patient or authorized representative who has indicated his/her understanding and acceptance.     Dental advisory given  Plan Discussed with: CRNA and Surgeon  Anesthesia Plan Comments:         Anesthesia Quick Evaluation

## 2020-12-21 NOTE — H&P (Signed)
@LOGO @   Primary Care Physician:  [name], MD Primary Gastroenterologist:  Dr. Gala Romney  Pre-Procedure History & Physical: HPI:  [name] is a 80 y.o. female here for for further evaluation of weight loss, nausea, GERD change in bowel habits via EGD and colonoscopy.  Patient denies dysphagia.  Wean herself off her PPI therapy entirely.  Linzess 72 daily associated with improvement in bowel function but still only 1 bowel movement every other day which patient feels is suboptimal.  Past Medical History:  Diagnosis Date  . Anemia   . Anxiety   . Arthritis   . Asthma   . Constipation   . GERD (gastroesophageal reflux disease)   . High blood pressure   . Hypothyroidism   . Seasonal allergies   . Thyroid disease   . Varicose veins   . Vertigo 11/23/2013    Past Surgical History:  Procedure Laterality Date  . APPENDECTOMY    . CATARACT EXTRACTION W/PHACO Left 01/27/2017   Procedure: CATARACT EXTRACTION PHACO AND INTRAOCULAR LENS PLACEMENT (IOC);  Surgeon: Tonny Branch, MD;  Location: [facility];  Service: Ophthalmology;  Laterality: Left;  CDE: 29.90  . CATARACT EXTRACTION W/PHACO Right 02/24/2017   Procedure: CATARACT EXTRACTION PHACO AND INTRAOCULAR LENS PLACEMENT (I[facility]);  Surgeon: Tonny Branch, MD;  Location: [facility];  Service: Ophthalmology;  Laterality: Right;  CDE: 8.57  . CHOLECYSTECTOMY    . COLONOSCOPY N/A 10/01/2016   Procedure: COLONOSCOPY;  Surgeon: Daneil Dolin, MD; pancolonic diverticulosis, melanosis coli, otherwise normal exam.  No recommendations to repeat colonoscopy.  Marland Kitchen FLEXOR TENDON REPAIR Right 11/06/2017   Procedure: RIGHT WRIST FLEXOR TENDON REPAIRAND [facility]T [place];  Surgeon: Leanora Cover, MD;  Location: [place];  Service: Orthopedics;  Laterality: Right;  . TUBAL LIGATION      Prior to Admission medications   Medication Sig Start Date End Date Taking? Authorizing Provider  acetaminophen (TYLENOL) 500 MG tablet Take 500 mg by mouth every  6 (six) hours as needed (pain.).   Yes [provider]  albuterol (VENTOLIN HFA) 108 (90 Base) MCG/ACT inhaler Inhale 2 puffs into the lungs every 6 (six) hours as needed for wheezing or shortness of breath.   Yes [provider]  ALPRAZolam (XANAX) 0.5 MG tablet Take 0.5 mg by mouth in the morning, at noon, and at bedtime. (1000, 1500, AT BEDTIME)   Yes [provider]  amLODipine (NORVASC) 2.5 MG tablet Take 2.5 mg by mouth every evening.  02/09/13  Yes [provider]  aspirin EC 81 MG tablet Take 81 mg by mouth every evening.   Yes [provider]  chlorhexidine (PERIDEX) 0.12 % solution Use as directed 15 mLs in the mouth or throat 2 (two) times daily as needed (mouth ulcers).   Yes [provider]  fluocinonide gel (LIDEX) 5.73 % Apply 1 application topically 3 (three) times daily as needed (ulcers.). 09/28/20  Yes [provider]  fluticasone (FLONASE) 50 MCG/ACT nasal spray Place 2 sprays into both nostrils daily as needed for allergies.   Yes [name], MD  levothyroxine (SYNTHROID) 50 MCG tablet Take 1 tablet (50 mcg total) by mouth daily. Patient taking differently: Take 50 mcg by mouth daily at 6 (six) AM. 08/02/20  Yes Brita Romp, NP  linaclotide [facility]) 72 MCG capsule Take 1 capsule (72 mcg total) by mouth daily before breakfast. 11/08/20  Yes Aliene Altes S, PA-C  losartan (COZAAR) 25 MG tablet Take 25 mg by mouth  daily. IN THE MORNING. 07/14/20  Yes [provider]  Multiple Vitamin (MULTIVITAMIN WITH MINERALS) TABS tablet Take 1 tablet by mouth daily in the afternoon.   Yes [provider]  mupirocin ointment (BACTROBAN) 2 % Apply 1 application topically daily. Patient taking differently: Apply 1 application topically daily as needed (wound care). 11/06/20  Yes Trula Slade, DPM  NONFORMULARY OR COMPOUNDED ITEM Apply 1 application topically 3 (three) times daily as needed (foot  neuropathy). neuropathy cream dic 3%/bac 2%/gab %/lid 5%/meth 1%   Yes [provider]  ondansetron (ZOFRAN) 4 MG tablet Take 4 mg by mouth every 8 (eight) hours as needed for nausea or vomiting.   Yes [provider]  PREVIDENT 5000 BOOSTER PLUS 1.1 % PSTE Place 1 application onto teeth at bedtime. 09/28/20  Yes [provider]  raloxifene (EVISTA) 60 MG tablet Take 1 tablet (60 mg total) by mouth every morning. Patient taking differently: Take 60 mg by mouth every other day. IN THE MORNING 04/10/20  Yes Estill Dooms, NP  SYMBICORT 80-4.5 MCG/ACT inhaler INHALE 2 PUFFS INTO THE LUNGS TWICE DAILY. Patient taking differently: Inhale 2 puffs into the lungs daily. 02/02/18  Yes Tanda Rockers, MD  Turmeric 500 MG TABS Take 500 mg by mouth daily with lunch.   Yes [provider]  venlafaxine XR (EFFEXOR-XR) 37.5 MG 24 hr capsule Take 37.5 mg by mouth daily with breakfast.  07/10/20  Yes [provider]  doxycycline (VIBRA-TABS) 100 MG tablet Take 1 tablet (100 mg total) by mouth 2 (two) times daily. Patient not taking: Reported on 12/15/2020 11/15/20   Trula Slade, DPM  pantoprazole (PROTONIX) 40 MG tablet Take 1 tablet (40 mg total) by mouth daily. Patient not taking: Reported on 12/15/2020 10/23/20   Mahala Menghini, PA-C    Allergies as of 11/02/2020 - Review Complete 11/02/2020  Allergen Reaction Noted  . Amoxicillin-pot clavulanate Nausea And Vomiting, Other (See Comments), Itching, and Nausea Only 10/04/2013  . Ace inhibitors Cough   . Entex la Itching   . Levofloxacin Hives and Itching 01/21/2017  . Meloxicam  04/25/2020    Family History  Problem Relation Age of Onset  . Hypertension Mother   . Varicose Veins Father   . Heart attack Father   . Heart disease Father   . Hypertension Brother   . COPD Brother     Social History   Socioeconomic History  . Marital status: Married    Spouse name: Not on file  . Number of  children: Not on file  . Years of education: Not on file  . Highest education level: Not on file  Occupational History  . Not on file  Tobacco Use  . Smoking status: Former Smoker    Packs/day: 0.25    Years: 5.00    Pack years: 1.25    Types: Cigarettes    Quit date: 12/02/1997    Years since quitting: 23.0  . Smokeless tobacco: Never Used  Vaping Use  . Vaping Use: Never used  Substance and Sexual Activity  . Alcohol use: Not Currently    Alcohol/week: 1.0 standard drink    Types: 1 Glasses of wine per week    Comment: occasional wine  . Drug use: No  . Sexual activity: Yes    Birth control/protection: Surgical    Comment: tubal  Other Topics Concern  . Not on file  Social History Narrative  . Not on file   Social Determinants  of Health   Financial Resource Strain: Low Risk   . Difficulty of Paying Living Expenses: Not hard at all  Food Insecurity: No Food Insecurity  . Worried About Charity fundraiser in the Last Year: Never true  . Ran Out of Food in the Last Year: Never true  Transportation Needs: No Transportation Needs  . Lack of Transportation (Medical): No  . Lack of Transportation (Non-Medical): No  Physical Activity: Inactive  . Days of Exercise per Week: 0 days  . Minutes of Exercise per Session: 0 min  Stress: Stress Concern Present  . Feeling of Stress : To some extent  Social Connections: Socially Integrated  . Frequency of Communication with Friends and Family: More than three times a week  . Frequency of Social Gatherings with Friends and Family: More than three times a week  . Attends Religious Services: More than 4 times per year  . Active Member of Clubs or Organizations: Yes  . Attends Archivist Meetings: More than 4 times per year  . Marital Status: Married  Human resources officer Violence: Not At Risk  . Fear of Current or Ex-Partner: No  . Emotionally Abused: No  . Physically Abused: No  . Sexually Abused: No    Review of  Systems: See HPI, otherwise negative ROS  Physical Exam: BP (!) 143/84   Pulse 88   Temp 98 F (36.7 C) (Oral)   Resp 20   SpO2 92%  General:   Alert,  Well-developed, well-nourished, pleasant and cooperative in NAD Neck:  Supple; no masses or thyromegaly. No significant cervical adenopathy. Lungs:  Clear throughout to auscultation.   No wheezes, crackles, or rhonchi. No acute distress. Heart:  Regular rate and rhythm; no murmurs, clicks, rubs,  or gallops. Abdomen: Non-distended, normal bowel sounds.  Soft and nontender without appreciable mass or hepatosplenomegaly.  Pulses:  Normal pulses noted. Extremities:  Without clubbing or edema.  Impression/Plan: 80 year old lady with anorexia, weight loss history of GERD.  Change in bowel habits as described above. She is here for diagnostic EGD and colonoscopy per plan. The risks, benefits, limitations, imponderables and alternatives regarding both EGD and colonoscopy have been reviewed with the patient. Questions have been answered. All parties agreeable.      Notice: This dictation was prepared with Dragon dictation along with smaller phrase technology. Any transcriptional errors that result from this process are unintentional and may not be corrected upon review.

## 2020-12-21 NOTE — Transfer of Care (Signed)
Immediate Anesthesia Transfer of Care Note  Patient: MITSUYE SCHRODT  Procedure(s) Performed: COLONOSCOPY WITH PROPOFOL (N/A ) ESOPHAGOGASTRODUODENOSCOPY (EGD) WITH PROPOFOL (N/A ) BIOPSY  Patient Location: PACU and Endoscopy Unit  Anesthesia Type:General  Level of Consciousness: awake  Airway & Oxygen Therapy: Patient Spontanous Breathing  Post-op Assessment: Report given to RN  Post vital signs: Reviewed and stable  Last Vitals:  Vitals Value Taken Time  BP    Temp    Pulse    Resp    SpO2      Last Pain:  Vitals:   12/21/20 1019  TempSrc:   PainSc: 0-No pain      Patients Stated Pain Goal: 1 (46/27/03 5009)  Complications: No complications documented.

## 2020-12-22 ENCOUNTER — Other Ambulatory Visit: Payer: Self-pay

## 2020-12-22 LAB — SURGICAL PATHOLOGY

## 2020-12-24 ENCOUNTER — Other Ambulatory Visit: Payer: Self-pay

## 2020-12-24 ENCOUNTER — Encounter: Payer: Self-pay | Admitting: Emergency Medicine

## 2020-12-24 ENCOUNTER — Ambulatory Visit
Admission: EM | Admit: 2020-12-24 | Discharge: 2020-12-24 | Disposition: A | Discharge status: Home / Self Care | Payer: Medicare HMO

## 2020-12-24 DIAGNOSIS — R58 Hemorrhage, not elsewhere classified: Secondary | ICD-10-CM

## 2020-12-24 DIAGNOSIS — W19XXXD Unspecified fall, subsequent encounter: Secondary | ICD-10-CM | POA: Diagnosis not present

## 2020-12-24 NOTE — ED Triage Notes (Signed)
States she was seen for a fall on Wednesday, now has bruising to right ankle and lower leg.

## 2020-12-25 ENCOUNTER — Encounter (HOSPITAL_COMMUNITY): Payer: Self-pay | Admitting: Internal Medicine

## 2020-12-25 ENCOUNTER — Encounter: Payer: Self-pay | Admitting: Internal Medicine

## 2020-12-25 NOTE — Discharge Instructions (Addendum)
May use Tylenol and elevate your leg  May use ice to the area  Follow up with this office or with primary care if symptoms are persisting.  Follow up in the ER for high fever, trouble swallowing, trouble breathing, other concerning symptoms.

## 2020-12-25 NOTE — ED Provider Notes (Signed)
[facility]    CSN: [id number] Arrival date & time: 12/24/20  6440      History   Chief Complaint No chief complaint on file.   HPI [name] is a 80 y.o. female.   Reports that she has been having right ankle pain and bruising since she fell 2 days ago.  Was seen in this office 2 days ago, did not have the significant bruising that she does now.  Had an x-ray of the right hip and that was negative.  Reports that she is very concerned for cellulitis.  Denies any redness, warmth, tenderness to the right ankle, just bruising and soreness.  Denies fever, headache, cough, nausea, vomiting, diarrhea  The history is provided by the patient.    Past Medical History:  Diagnosis Date  . Anemia   . Anxiety   . Arthritis   . Asthma   . Constipation   . GERD (gastroesophageal reflux disease)   . High blood pressure   . Hypothyroidism   . Seasonal allergies   . Thyroid disease   . Varicose veins   . Vertigo 11/23/2013    Patient Active Problem List   Diagnosis Date Noted  . Indigestion 11/02/2020  . Change in stool caliber 11/02/2020  . Loss of weight 08/23/2020  . Nausea without vomiting 06/15/2020  . Tick bite 04/10/2020  . Medication refill 04/10/2020  . Acute maxillary sinusitis 08/20/2019  . Cellulitis, leg 08/10/2019  . Cellulitis 08/08/2019  . Fever 08/08/2019  . Cellulitis of foot 08/08/2019  . Hypokalemia 08/08/2019  . Hyponatremia 08/08/2019  . Essential hypertension 08/08/2019  . 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  . Adverse food reaction 09/24/2018  . Mild persistent asthma without complication 34/74/2595  . Pain of left hip joint 07/17/2018  . Dyspnea on exertion 01/24/2017  . Cough variant asthma  vs UACS 01/24/2017  . High risk medication use 09/25/2016  . Varicose veins of lower extremities with other complications 63/87/5643  . Varicose veins 02/15/2014  . Vertigo 11/23/2013  . Pain in limb 10/04/2013  . Hip bursitis  02/11/2013  . Constipation 02/16/2009  . RECTAL BLEEDING 02/16/2009  . HIGH BLOOD PRESSURE 01/29/2008    Past Surgical History:  Procedure Laterality Date  . APPENDECTOMY    . BIOPSY  12/21/2020   Procedure: BIOPSY;  Surgeon: Daneil Dolin, MD;  [facility];  Service: Endoscopy;;  . CATARACT EXTRACTION W/PHACO Left 01/27/2017   Procedure: CATARACT EXTRACTION PHACO AND INTRAOCULAR LENS PLACEMENT ([place]);  Surgeon: Tonny Branch, MD;  Location: [facility];  Service: Ophthalmology;  Laterality: Left;  CDE: 29.90  . CATARACT EXTRACTION W/PHACO Right 02/24/2017   Procedure: CATARACT EXTRACTION PHACO AND INTRAOCULAR LENS PLACEMENT (IOC);  Surgeon: Tonny Branch, MD;  Location: [facility];  Service: Ophthalmology;  Laterality: Right;  CDE: 8.57  . CHOLECYSTECTOMY    . COLONOSCOPY N/A 10/01/2016   Procedure: COLONOSCOPY;  Surgeon: Daneil Dolin, MD; pancolonic diverticulosis, melanosis coli, otherwise normal exam.  No recommendations to repeat colonoscopy.  . COLONOSCOPY WITH PROPOFOL N/A 12/21/2020   Procedure: COLONOSCOPY WITH PROPOFOL;  Surgeon: Daneil Dolin, MD;  [facility];  Service: Endoscopy;  Laterality: N/A;  10:30am  . ESOPHAGOGASTRODUODENOSCOPY (EGD) WITH PROPOFOL N/A 12/21/2020   Procedure: ESOPHAGOGASTRODUODENOSCOPY (EGD) WITH PROPOFOL;  Surgeon: Daneil Dolin, MD;  [facility];  Service: Endoscopy;  Laterality: N/A;  . FLEXOR TENDON REPAIR Right 11/06/2017   Procedure: RIGHT WRIST FLEXOR TENDON REPAIRAND STT [place];  Surgeon: Leanora Cover, MD;  Location: [place];  Service: Orthopedics;  Laterality: Right;  . TUBAL LIGATION      OB History    Gravida  2   Para  2   Term      Preterm      AB      Living  2     SAB      IAB      Ectopic      Multiple      Live Births  2            Home Medications    Prior to Admission medications   Medication Sig Start Date End Date Taking? Authorizing Provider   acetaminophen (TYLENOL) 500 MG tablet Take 500 mg by mouth every 6 (six) hours as needed (pain.).    [provider]  albuterol (VENTOLIN HFA) 108 (90 Base) MCG/ACT inhaler Inhale 2 puffs into the lungs every 6 (six) hours as needed for wheezing or shortness of breath.    [provider]  ALPRAZolam Duanne Moron) 0.5 MG tablet Take 0.5 mg by mouth in the morning, at noon, and at bedtime. (1000, 1500, AT BEDTIME)    [provider]  amLODipine (NORVASC) 2.5 MG tablet Take 2.5 mg by mouth every evening.  02/09/13   [provider]  aspirin EC 81 MG tablet Take 81 mg by mouth every evening.    [provider]  chlorhexidine (PERIDEX) 0.12 % solution Use as directed 15 mLs in the mouth or throat 2 (two) times daily as needed (mouth ulcers).    [provider]  fluocinonide gel (LIDEX) [id number] % Apply 1 application topically 3 (three) times daily as needed (ulcers.). 09/28/20   [provider]  fluticasone (FLONASE) 50 MCG/ACT nasal spray Place 2 sprays into both nostrils daily as needed for allergies.    [name], MD  levothyroxine (SYNTHROID) 50 MCG tablet Take 1 tablet (50 mcg total) by mouth daily. Patient taking differently: Take 50 mcg by mouth daily at 6 (six) AM. 08/02/20   Brita Romp, NP  linaclotide [facility]) 72 MCG capsule Take 1 capsule (72 mcg total) by mouth daily before breakfast. 11/08/20   Erenest Rasher, PA-C  losartan (COZAAR) 25 MG tablet Take 25 mg by mouth daily. IN THE MORNING. 07/14/20   [provider]  Multiple Vitamin (MULTIVITAMIN WITH MINERALS) TABS tablet Take 1 tablet by mouth daily in the afternoon.    [provider]  mupirocin ointment (BACTROBAN) 2 % Apply 1 application topically daily. Patient taking differently: Apply 1 application topically daily as needed (wound care). 11/06/20   Trula Slade, DPM  NONFORMULARY OR COMPOUNDED ITEM Apply 1 application topically 3 (three) times daily  as needed (foot neuropathy). neuropathy cream dic 3%/bac 2%/gab %/lid 5%/meth 1%    [provider]  ondansetron (ZOFRAN) 4 MG tablet Take 4 mg by mouth every 8 (eight) hours as needed for nausea or vomiting.    [provider]  PREVIDENT 5000 BOOSTER PLUS 1.1 % PSTE Place 1 application onto teeth at bedtime. 09/28/20   [provider]  raloxifene (EVISTA) 60 MG tablet Take 1 tablet (60 mg total) by mouth every morning. Patient taking differently: Take 60 mg by mouth every other day. IN THE MORNING 04/10/20   Estill Dooms, NP  SYMBICORT 80-4.5 MCG/ACT inhaler INHALE 2 PUFFS INTO THE LUNGS TWICE DAILY. Patient taking differently: Inhale 2 puffs into the lungs  daily. 02/02/18   Tanda Rockers, MD  Turmeric 500 MG TABS Take 500 mg by mouth daily with lunch.    [provider]  venlafaxine XR (EFFEXOR-XR) 37.5 MG 24 hr capsule Take 37.5 mg by mouth daily with breakfast.  07/10/20   [provider]    Family History Family History  Problem Relation Age of Onset  . Hypertension Mother   . Varicose Veins Father   . Heart attack Father   . Heart disease Father   . Hypertension Brother   . COPD Brother     Social History Social History   Tobacco Use  . Smoking status: Former Smoker    Packs/day: 0.25    Years: 5.00    Pack years: 1.25    Types: Cigarettes    Quit date: 12/02/1997    Years since quitting: 23.0  . Smokeless tobacco: Never Used  Vaping Use  . Vaping Use: Never used  Substance Use Topics  . Alcohol use: Not Currently    Alcohol/week: 1.0 standard drink    Types: 1 Glasses of wine per week    Comment: occasional wine  . Drug use: No     Allergies   Amoxicillin-pot clavulanate, Ace inhibitors, Entex la, Levofloxacin, and Meloxicam   Review of Systems Review of Systems   Physical Exam Triage Vital Signs ED Triage Vitals [12/24/20 0835]  Enc Vitals Group     BP (!) 161/87     Pulse Rate 70     Resp 18     Temp  (!) 97.4 F (36.3 C)     Temp Source Oral     SpO2 92 %     Weight 129 lb (58.5 kg)     Height 5\' 4"  (1.626 m)     Head Circumference      Peak Flow      Pain Score 6     Pain Loc      Pain Edu?      Excl. in [place]?    No data found.  Updated Vital Signs BP (!) 161/87 (BP Location: Right Arm)   Pulse 70   Temp (!) 97.4 F (36.3 C) (Oral)   Resp 18   Ht 5\' 4"  (1.626 m)   Wt 129 lb (58.5 kg)   SpO2 92%   BMI 22.14 kg/m   Visual Acuity Right Eye Distance:   Left Eye Distance:   Bilateral Distance:    Right Eye Near:   Left Eye Near:    Bilateral Near:     Physical Exam Vitals and nursing note reviewed.  Constitutional:      General: She is not in acute distress.    Appearance: Normal appearance. She is well-developed and well-nourished.  HENT:     Head: Normocephalic and atraumatic.     Mouth/Throat:     Mouth: Mucous membranes are moist.     Pharynx: Oropharynx is clear.  Eyes:     Extraocular Movements: Extraocular movements intact.     Conjunctiva/sclera: Conjunctivae normal.     Pupils: Pupils are equal, round, and reactive to light.  Cardiovascular:     Rate and Rhythm: Normal rate and regular rhythm.     Heart sounds: No murmur heard.   Pulmonary:     Effort: Pulmonary effort is normal. No respiratory distress.     Breath sounds: Normal breath sounds.  Abdominal:     Palpations: Abdomen is soft.     Tenderness: There is no abdominal  tenderness.  Musculoskeletal:        General: No edema. Normal range of motion.     Cervical back: Normal range of motion and neck supple.  Skin:    General: Skin is warm and dry.     Capillary Refill: Capillary refill takes less than 2 seconds.     Findings: Bruising (Right lower leg) present.  Neurological:     General: No focal deficit present.     Mental Status: She is alert and oriented to person, place, and time.  Psychiatric:        Mood and Affect: Mood and affect and mood normal.        Behavior: Behavior  normal.        Thought Content: Thought content normal.      UC Treatments / Results  Labs (all labs ordered are listed, but only abnormal results are displayed) Labs Reviewed - No data to display  EKG   Radiology No results found.  Procedures Procedures (including critical care time)  Medications Ordered in UC Medications - No data to display  Initial Impression / Assessment and Plan / UC Course  I have reviewed the triage vital signs and the nursing notes.  Pertinent labs & imaging results that were available during my care of the patient were reviewed by me and considered in my medical decision making (see chart for details).     Ecchymosis Fall  Presents today with right ankle bruising No concern for cellulitis at this time No breaks in the skin, no redness, swelling, warmth, tenderness to the area, no drainage noted Instructed to be elevated, use ice and Tylenol for pain as needed Follow-up with any signs of infection including redness, warmth, tenderness, drainage from the area, fever, malaise, other concerning symptoms If you notice any red streaking up the leg, follow-up in the ER   Final Clinical Impressions(s) / UC Diagnoses   Final diagnoses:  Ecchymosis  Fall, subsequent encounter     Discharge Instructions     May use Tylenol and elevate your leg  May use ice to the area  Follow up with this office or with primary care if symptoms are persisting.  Follow up in the ER for high fever, trouble swallowing, trouble breathing, other concerning symptoms.     ED Prescriptions    None     PDMP not reviewed this encounter.   Faustino Congress, NP 12/25/20 1723

## 2020-12-26 DIAGNOSIS — G629 Polyneuropathy, unspecified: Secondary | ICD-10-CM | POA: Diagnosis not present

## 2020-12-26 DIAGNOSIS — J309 Allergic rhinitis, unspecified: Secondary | ICD-10-CM | POA: Diagnosis not present

## 2020-12-28 ENCOUNTER — Other Ambulatory Visit: Payer: Medicare HMO

## 2020-12-28 ENCOUNTER — Other Ambulatory Visit: Payer: Self-pay

## 2020-12-28 DIAGNOSIS — Z8616 Personal history of COVID-19: Secondary | ICD-10-CM

## 2020-12-28 DIAGNOSIS — Z20822 Contact with and (suspected) exposure to covid-19: Secondary | ICD-10-CM | POA: Diagnosis not present

## 2020-12-28 HISTORY — DX: Personal history of COVID-19: Z86.16

## 2020-12-29 ENCOUNTER — Telehealth: Payer: Self-pay

## 2020-12-29 LAB — NOVEL CORONAVIRUS, NAA: SARS-CoV-2, NAA: DETECTED — AB

## 2020-12-29 LAB — SARS-COV-2, NAA 2 DAY TAT

## 2020-12-29 NOTE — Telephone Encounter (Signed)
Spoke with pt. Pt was notified that it's ok to take the Asprin 81 mg and nothing beyond that.

## 2020-12-29 NOTE — Telephone Encounter (Signed)
Aspirin 81 mg once daily is okay.  But none beyond this.

## 2020-12-29 NOTE — Telephone Encounter (Signed)
VM received. Pt received letter with results and was asked to d/c NSAIDS. Pt takes Asprin 81 mg nightly. Pt wants to know if she needs to d/c Asprin 81 mg. Pt has PAD and thinks her PCP put her on 81 mg of Asprin years ago. She is going to discuss this with her PCP as well.

## 2020-12-30 ENCOUNTER — Telehealth (HOSPITAL_COMMUNITY): Payer: Self-pay | Admitting: Family

## 2020-12-30 NOTE — Telephone Encounter (Signed)
Called to discuss with patient about COVID-19 symptoms and the use of one of the available treatments for those with mild to moderate Covid symptoms and at a high risk of hospitalization.  Pt appears to qualify for outpatient treatment due to co-morbid conditions and/or a member of an at-risk group in accordance with the FDA Emergency Use Authorization.   Unable to reach pt and unable to leave a VM as VM box has not been set up.  [employer]

## 2021-01-04 ENCOUNTER — Encounter: Payer: Self-pay | Admitting: Emergency Medicine

## 2021-01-04 ENCOUNTER — Ambulatory Visit
Admission: EM | Admit: 2021-01-04 | Discharge: 2021-01-04 | Disposition: A | Discharge status: Home / Self Care | Payer: Medicare HMO | Attending: Emergency Medicine | Admitting: Emergency Medicine

## 2021-01-04 ENCOUNTER — Other Ambulatory Visit: Payer: Self-pay

## 2021-01-04 DIAGNOSIS — J028 Acute pharyngitis due to other specified organisms: Secondary | ICD-10-CM | POA: Diagnosis not present

## 2021-01-04 DIAGNOSIS — B9789 Other viral agents as the cause of diseases classified elsewhere: Secondary | ICD-10-CM | POA: Diagnosis not present

## 2021-01-04 DIAGNOSIS — U071 COVID-19: Secondary | ICD-10-CM | POA: Diagnosis not present

## 2021-01-04 LAB — POCT RAPID STREP A (OFFICE): Rapid Strep A Screen: NEGATIVE

## 2021-01-04 MED ORDER — BENZONATATE 100 MG PO CAPS: 100.0000 mg | ORAL_CAPSULE | Freq: Three times a day (TID) | ORAL | 0 refills | Status: DC | PRN

## 2021-01-04 MED ORDER — LIDOCAINE VISCOUS HCL 2 % MT SOLN: 15.0000 mL | OROMUCOSAL | 1 refills | Status: DC | PRN

## 2021-01-04 MED ORDER — DEXAMETHASONE 4 MG PO TABS: 4.0000 mg | ORAL_TABLET | Freq: Every day | ORAL | 0 refills | Status: AC

## 2021-01-04 NOTE — ED Triage Notes (Signed)
Pt is COVID positive

## 2021-01-04 NOTE — ED Triage Notes (Signed)
Sore throat started yesterday. No fevers.

## 2021-01-04 NOTE — Discharge Instructions (Addendum)
°  Strep test is negative.  Sample will be sent for culture and someone will call if your result is abnormal.  Get plenty of rest and push fluids Tessalon Perles prescribed for cough Continue to gargle with salty warm water daily and use throat lozenges such as Halls, Vicks or Cepacol Lidocaine mouthwash was prescribed.This is an oral solution you can swish, and gargle as needed for symptomatic relief of sore throat.  Do not exceed 8 doses in a 24 hour period.  Do not use prior to eating, as this will numb your entire mouth.   Decadron was prescribed Use medications daily for symptom relief Use OTC medications like ibuprofen or tylenol as needed fever or pain Call or go to the ED if you have any new or worsening symptoms such as fever, worsening cough, shortness of breath, chest tightness, chest pain, turning blue, changes in mental status, etc..Marland Kitchen

## 2021-01-04 NOTE — ED Provider Notes (Signed)
[place]   [id number] 01/04/21 Arrival Time: 1962   CC: COVID Infections  SUBJECTIVE: History from: patient.  [name] is a 80 y.o. female presented to the urgent care with a complaint of being Covid positive x7 days.  Denies sick exposure to COVID, flu or strep.  Denies recent travel.  Has tried the medication without relief.  Denies alleviating or aggravating factors.  Denies previous symptoms in the past.   Denies fever, chills, fatigue, sinus pain, rhinorrhea, sore throat, SOB, wheezing, chest pain, nausea, changes in bowel or bladder habits.     ROS: As per HPI.  All other pertinent ROS negative.      Past Medical History:  Diagnosis Date  . Anemia   . Anxiety   . Arthritis   . Asthma   . Constipation   . GERD (gastroesophageal reflux disease)   . High blood pressure   . Hypothyroidism   . Seasonal allergies   . Thyroid disease   . Varicose veins   . Vertigo 11/23/2013   Past Surgical History:  Procedure Laterality Date  . APPENDECTOMY    . BIOPSY  12/21/2020   Procedure: BIOPSY;  Surgeon: Daneil Dolin, MD;  [facility]: [facility];  Service: Endoscopy;;  . CATARACT EXTRACTION W/PHACO Left 01/27/2017   Procedure: CATARACT EXTRACTION PHACO AND INTRAOCULAR LENS PLACEMENT ([place]);  Surgeon: Tonny Branch, MD;  Location: [facility];  Service: Ophthalmology;  Laterality: Left;  CDE: 29.90  . CATARACT EXTRACTION W/PHACO Right 02/24/2017   Procedure: CATARACT EXTRACTION PHACO AND INTRAOCULAR LENS PLACEMENT ([facility]);  Surgeon: Tonny Branch, MD;  Location: [facility];  Service: Ophthalmology;  Laterality: Right;  CDE: 8.57  . CHOLECYSTECTOMY    . COLONOSCOPY N/A 10/01/2016   Procedure: COLONOSCOPY;  Surgeon: Daneil Dolin, MD; pancolonic diverticulosis, melanosis coli, otherwise normal exam.  No recommendations to repeat colonoscopy.  . COLONOSCOPY WITH PROPOFOL N/A 12/21/2020   Procedure: COLONOSCOPY WITH PROPOFOL;  Surgeon: Daneil Dolin, MD;  [facility];   Service: Endoscopy;  Laterality: N/A;  10:30am  . ESOPHAGOGASTRODUODENOSCOPY (EGD) WITH PROPOFOL N/A 12/21/2020   Procedure: ESOPHAGOGASTRODUODENOSCOPY (EGD) WITH PROPOFOL;  Surgeon: Daneil Dolin, MD;  [facility];  Service: Endoscopy;  Laterality: N/A;  . FLEXOR TENDON REPAIR Right 11/06/2017   Procedure: RIGHT WRIST FLEXOR TENDON REPAIRAND STT [place];  Surgeon: Leanora Cover, MD;  Location: [place];  Service: Orthopedics;  Laterality: Right;  . TUBAL LIGATION     Allergies  Allergen Reactions  . Amoxicillin-Pot Clavulanate Nausea And Vomiting, Other (See Comments), Itching and Nausea Only    Has patient had a PCN reaction causing immediate rash, facial/tongue/throat swelling, SOB or lightheadedness with hypotension: No Has patient had a PCN reaction causing severe rash involving mucus membranes or skin necrosis: No Has patient had a PCN reaction that required hospitalization: No Has patient had a PCN reaction occurring within the last 10 years: No If all of the above answers are "NO", then may proceed with Cephalosporin use. Has patient had a PCN reaction causing immediate rash, facial/tongue/throat swelling, SOB or lightheadedness with hypotension: No Has patient had a PCN reaction causing severe rash involving mucus membranes or skin necrosis: No Has patient had a PCN reaction that required hospitalization: No Has patient had a PCN reaction occurring within the last 10 years: No If all of the above answers are "NO", then may proceed with Cephalosporin use.   . Ace Inhibitors Cough  . Entex La Itching  .  Levofloxacin Hives and Itching  . Meloxicam Other (See Comments)    (MOBIC) Unknown reaction type (maybe nausea?)   No current facility-administered medications on file prior to encounter.   Current Outpatient Medications on File Prior to Encounter  Medication Sig Dispense Refill  . acetaminophen (TYLENOL) 500 MG tablet Take 500 mg by mouth every  6 (six) hours as needed (pain.).    Marland Kitchen albuterol (VENTOLIN HFA) 108 (90 Base) MCG/ACT inhaler Inhale 2 puffs into the lungs every 6 (six) hours as needed for wheezing or shortness of breath.    . ALPRAZolam (XANAX) 0.5 MG tablet Take 0.5 mg by mouth in the morning, at noon, and at bedtime. (1000, 1500, AT BEDTIME)    . amLODipine (NORVASC) 2.5 MG tablet Take 2.5 mg by mouth every evening.     Marland Kitchen aspirin EC 81 MG tablet Take 81 mg by mouth every evening.    . chlorhexidine (PERIDEX) 0.12 % solution Use as directed 15 mLs in the mouth or throat 2 (two) times daily as needed (mouth ulcers).    . fluocinonide gel (LIDEX) 5.28 % Apply 1 application topically 3 (three) times daily as needed (ulcers.).    Marland Kitchen fluticasone (FLONASE) 50 MCG/ACT nasal spray Place 2 sprays into both nostrils daily as needed for allergies.    Marland Kitchen levothyroxine (SYNTHROID) 50 MCG tablet Take 1 tablet (50 mcg total) by mouth daily. (Patient taking differently: Take 50 mcg by mouth daily at 6 (six) AM.) 90 tablet 3  . linaclotide (LINZESS) 72 MCG capsule Take 1 capsule (72 mcg total) by mouth daily before breakfast. 30 capsule 5  . losartan (COZAAR) 25 MG tablet Take 25 mg by mouth daily. IN THE MORNING.    . Multiple Vitamin (MULTIVITAMIN WITH MINERALS) TABS tablet Take 1 tablet by mouth daily in the afternoon.    . mupirocin ointment (BACTROBAN) 2 % Apply 1 application topically daily. (Patient taking differently: Apply 1 application topically daily as needed (wound care).) 30 g 2  . NONFORMULARY OR COMPOUNDED ITEM Apply 1 application topically 3 (three) times daily as needed (foot neuropathy). neuropathy cream dic 3%/bac 2%/gab %/lid 5%/meth 1%    . ondansetron (ZOFRAN) 4 MG tablet Take 4 mg by mouth every 8 (eight) hours as needed for nausea or vomiting.    Marland Kitchen PREVIDENT 5000 BOOSTER PLUS 1.1 % PSTE Place 1 application onto teeth at bedtime.    . raloxifene (EVISTA) 60 MG tablet Take 1 tablet (60 mg total) by mouth every morning.  (Patient taking differently: Take 60 mg by mouth every other day. IN THE MORNING) 30 tablet 12  . SYMBICORT 80-4.5 MCG/ACT inhaler INHALE 2 PUFFS INTO THE LUNGS TWICE DAILY. (Patient taking differently: Inhale 2 puffs into the lungs daily.) 10.2 g 0  . Turmeric 500 MG TABS Take 500 mg by mouth daily with lunch.    . venlafaxine XR (EFFEXOR-XR) 37.5 MG 24 hr capsule Take 37.5 mg by mouth daily with breakfast.      Social History   Socioeconomic History  . Marital status: Married    Spouse name: Not on file  . Number of children: Not on file  . Years of education: Not on file  . Highest education level: Not on file  Occupational History  . Not on file  Tobacco Use  . Smoking status: Former Smoker    Packs/day: 0.25    Years: 5.00    Pack years: 1.25    Types: Cigarettes    Quit date: 12/02/1997  Years since quitting: 23.1  . Smokeless tobacco: Never Used  Vaping Use  . Vaping Use: Never used  Substance and Sexual Activity  . Alcohol use: Not Currently    Alcohol/week: 1.0 standard drink    Types: 1 Glasses of wine per week    Comment: occasional wine  . Drug use: No  . Sexual activity: Yes    Birth control/protection: Surgical    Comment: tubal  Other Topics Concern  . Not on file  Social History Narrative  . Not on file   Social Determinants of Health   Financial Resource Strain: Low Risk   . Difficulty of Paying Living Expenses: Not hard at all  Food Insecurity: No Food Insecurity  . Worried About Charity fundraiser in the Last Year: Never true  . Ran Out of Food in the Last Year: Never true  Transportation Needs: No Transportation Needs  . Lack of Transportation (Medical): No  . Lack of Transportation (Non-Medical): No  Physical Activity: Inactive  . Days of Exercise per Week: 0 days  . Minutes of Exercise per Session: 0 min  Stress: Stress Concern Present  . Feeling of Stress : To some extent  Social Connections: Socially Integrated  . Frequency of  Communication with Friends and Family: More than three times a week  . Frequency of Social Gatherings with Friends and Family: More than three times a week  . Attends Religious Services: More than 4 times per year  . Active Member of Clubs or Organizations: Yes  . Attends Archivist Meetings: More than 4 times per year  . Marital Status: Married  Human resources officer Violence: Not At Risk  . Fear of Current or Ex-Partner: No  . Emotionally Abused: No  . Physically Abused: No  . Sexually Abused: No   Family History  Problem Relation Age of Onset  . Hypertension Mother   . Varicose Veins Father   . Heart attack Father   . Heart disease Father   . Hypertension Brother   . COPD Brother     OBJECTIVE:  Vitals:   01/04/21 1000 01/04/21 1005  BP: 137/83   Pulse: 85   Resp: 18   Temp:  (!) 97.1 F (36.2 C)  TempSrc:  Temporal  SpO2: 98%   Weight: 135 lb (61.2 kg)      General appearance: alert; appears fatigued, but nontoxic; speaking in full sentences and tolerating own secretions HEENT: NCAT; Ears: EACs clear, TMs pearly gray; Eyes: PERRL.  EOM grossly intact. Sinuses: nontender; Nose: nares patent without rhinorrhea, Throat: oropharynx clear, tonsils non erythematous or enlarged, uvula midline  Neck: supple without LAD Lungs: unlabored respirations, symmetrical air entry; cough: mild; no respiratory distress; CTAB Heart: regular rate and rhythm.  Radial pulses 2+ symmetrical bilaterally Skin: warm and dry Psychological: alert and cooperative; normal mood and affect  LABS:  Results for orders placed or performed during the hospital encounter of 01/04/21 (from the past 24 hour(s))  POCT rapid strep A     Status: None   Collection Time: 01/04/21 10:09 AM  Result Value Ref Range   Rapid Strep A Screen Negative Negative     ASSESSMENT & PLAN:  1. COVID-19 virus infection   2. Sore throat (viral)     No orders of the defined types were placed in this  encounter.   Discharge instructions  Strep test is negative.  Sample will be sent for culture and someone will call if your result is abnormal.  Get plenty of rest and push fluids Tessalon Perles prescribed for cough Continue to gargle with salty warm water daily and use throat lozenges such as Halls, Vicks or Cepacol Lidocaine mouthwash was prescribed.This is an oral solution you can swish, and gargle as needed for symptomatic relief of sore throat.  Do not exceed 8 doses in a 24 hour period.  Do not use prior to eating, as this will numb your entire mouth.   Decadron was prescribed Use medications daily for symptom relief Use OTC medications like ibuprofen or tylenol as needed fever or pain Call or go to the ED if you have any new or worsening symptoms such as fever, worsening cough, shortness of breath, chest tightness, chest pain, turning blue, changes in mental status, etc...   Reviewed expectations re: course of current medical issues. Questions answered. Outlined signs and symptoms indicating need for more acute intervention. Patient verbalized understanding. After Visit Summary given.         Emerson Monte, FNP 01/04/21 [phone]

## 2021-01-07 LAB — CULTURE, GROUP A STREP (THRC)

## 2021-01-09 ENCOUNTER — Ambulatory Visit: Payer: Medicare HMO | Admitting: Podiatry

## 2021-01-16 DIAGNOSIS — U099 Post covid-19 condition, unspecified: Secondary | ICD-10-CM | POA: Diagnosis not present

## 2021-01-16 DIAGNOSIS — J029 Acute pharyngitis, unspecified: Secondary | ICD-10-CM | POA: Diagnosis not present

## 2021-01-18 ENCOUNTER — Telehealth: Payer: Self-pay | Admitting: Internal Medicine

## 2021-01-18 ENCOUNTER — Ambulatory Visit: Payer: Medicare HMO | Admitting: Podiatry

## 2021-01-18 DIAGNOSIS — R1031 Right lower quadrant pain: Secondary | ICD-10-CM

## 2021-01-18 NOTE — Telephone Encounter (Signed)
Pt is having lower right side pain and wants to speak with nurse. [phone]

## 2021-01-18 NOTE — Telephone Encounter (Signed)
Lmom, waiting on a return call.  

## 2021-01-19 ENCOUNTER — Telehealth: Payer: Self-pay | Admitting: Internal Medicine

## 2021-01-19 NOTE — Telephone Encounter (Signed)
Patient returning call. Please call [phone]

## 2021-01-19 NOTE — Telephone Encounter (Signed)
Spoke with pt. Pt is having some rt lower quadrant pain qam that has been present for a couple months. The pain doesn't last all day and is felt in the morning. Pt has completed her EGD/TCS and is aware that a small ulcer was found. Pt is taking Pantoprazole 40 mg and is taking Linzess 72 mcg. Pt did mention that Dr. Gala Romney changed her to Linzess 145 after her procedures and she just realized that she was still taking 72 mcg. Pt is having a soft/loose DM once daily after breakfast. Pt isn't sure if the ulcer is causing the rt lower quadrant pain near her navel. Please advise in the absence of Aliene Altes, [place].

## 2021-01-19 NOTE — Telephone Encounter (Signed)
See other phone note

## 2021-01-22 ENCOUNTER — Telehealth: Payer: Self-pay | Admitting: Internal Medicine

## 2021-01-22 DIAGNOSIS — R1031 Right lower quadrant pain: Secondary | ICD-10-CM | POA: Diagnosis not present

## 2021-01-22 LAB — CREATININE, SERUM: Creat: 0.94 mg/dL — ABNORMAL HIGH (ref 0.60–0.93)

## 2021-01-22 LAB — BUN: BUN: 16 mg/dL (ref 7–25)

## 2021-01-22 NOTE — Telephone Encounter (Signed)
PA for CT A/P approved via [employer]. Auth# [id number] DOS: 01/22/2021-02/21/2021  CT scheduled for 2/24 at 10:00am, arrival 9:45am, npo 4 hrs, p/u oral prep, needs lab work prior  [employer] pt and made aware of appt details. She voiced understanding and will have lab work done today

## 2021-01-22 NOTE — Telephone Encounter (Signed)
Routing message to [employer], [place].   Spoke with pt this morning. Pt wanted to mention that when Aliene Altes, PA first put her on Linzess, pt strained and felt something pull in her rt side when pt first started taking the medication. Pt isn't sure if she could've strained a muscle or something that could be csusing the pain she is having in her rt side. That pain comes and goes. Pt continued to go on with pain and didn't mention it since she was going to have a TCS. Pt had the TCS and it's continued to hurt on her rt lower side. Pt can't pinpoint exactly where that pain is located but stated it's on her rt lower side. Pain is about a 4-5 this morning.

## 2021-01-22 NOTE — Addendum Note (Signed)
Addended by: Cheron Every on: 01/22/2021 02:02 PM   Modules accepted: Orders

## 2021-01-22 NOTE — Telephone Encounter (Signed)
Message sent

## 2021-01-22 NOTE — Telephone Encounter (Signed)
Patient returned call, please call back  

## 2021-01-22 NOTE — Telephone Encounter (Signed)
Spoke with patient.  She has continued to have right lower quadrant abdominal pain daily but feels the pain is worsening.  Pain used to be intermittent, but now pain has been persisting all day since late last week.  Currently about 6/10 in severity.  Taking tylenol for the pain. Worsened by pressing on the right lower quadrant and if getting up and moving around.  Less severe when sitting still. No fever, chills, nausea, or vomiting.  Bowels are moving well with Linzess.  States she is taking 145 mcg daily.  Previously, pain improved somewhat with a bowel movement, but bowel movements are no longer improving her abdominal pain. No brbpr or melena.   No significant findings on recent colonoscopy.  Would not expect antral gastric ulcer to be contributing to her pain.  Patient is very concerned about this abdominal pain.  Appendectomy is listed on her surgical history; however, patient cannot remember if she had her appendix removed or not.  Remembers having her gallbladder removed years ago.  I have offered CT A/P with contrast ASAP for further evaluation.  [facility] Clinical Pool: Please arrange CT A/P with contrast ASAP. Dx: RLQ abdominal pain.  She will need Cr updated.  *Patient stated this Thursday would work best for her schedule.

## 2021-01-25 ENCOUNTER — Ambulatory Visit (HOSPITAL_COMMUNITY)
Admission: RE | Admit: 2021-01-25 | Discharge: 2021-01-25 | Disposition: A | Discharge status: Home / Self Care | Payer: Medicare HMO | Source: Ambulatory Visit | Attending: Gastroenterology | Admitting: Gastroenterology

## 2021-01-25 ENCOUNTER — Other Ambulatory Visit: Payer: Self-pay

## 2021-01-25 DIAGNOSIS — R1031 Right lower quadrant pain: Secondary | ICD-10-CM | POA: Diagnosis not present

## 2021-01-25 DIAGNOSIS — R109 Unspecified abdominal pain: Secondary | ICD-10-CM | POA: Diagnosis not present

## 2021-01-25 MED ORDER — IOHEXOL 300 MG/ML  SOLN
100.0000 mL | Freq: Once | INTRAMUSCULAR | Status: AC | PRN
  Administered 2021-01-25: 100 mL via INTRAVENOUS

## 2021-01-26 ENCOUNTER — Telehealth: Payer: Self-pay

## 2021-01-26 NOTE — Telephone Encounter (Signed)
Spoke with pt. Pt would like to know her CT results. Pt states she is having some rt side pain. Pain doesn't hurt when she is still and not moving. Pt states when she moves around, it starts.

## 2021-01-26 NOTE — Telephone Encounter (Signed)
Please let patient know her CT results have not come back yet. Once they result to me, I will review them and we will be in contact with her regarding results.

## 2021-01-26 NOTE — Telephone Encounter (Signed)
Noted. Spoke with pt. Pt stated please let her know as soon as possible when results come in. We will notify pt when results come in.

## 2021-01-29 ENCOUNTER — Ambulatory Visit: Payer: Self-pay | Admitting: Student

## 2021-01-29 DIAGNOSIS — S72024A Nondisplaced fracture of epiphysis (separation) (upper) of right femur, initial encounter for closed fracture: Secondary | ICD-10-CM | POA: Diagnosis not present

## 2021-01-30 ENCOUNTER — Encounter (HOSPITAL_COMMUNITY): Payer: Self-pay | Admitting: Orthopedic Surgery

## 2021-01-30 ENCOUNTER — Other Ambulatory Visit: Payer: Self-pay

## 2021-01-30 NOTE — Progress Notes (Signed)
COVID Vaccine Completed: Yes Date COVID Vaccine completed: 12/16/19, 01/17/20, 09/28/20 Has received booster: Yes COVID vaccine manufacturer: [place]     Date of COVID positive in last 90 days: 12/28/2020 in epic  PCP - [name], MD Cardiologist - N/A  Chest x-ray - 09/11/20 in epic EKG - 09/12/20 in epic Stress Test - N/A ECHO - N/A Cardiac Cath - greater than 2 year Pacemaker/ICD device last checked: N/A  Sleep Study - N/A CPAP - N/A  Fasting Blood Sugar - N/A Checks Blood Sugar __N/A___ times a day  Blood Thinner Instructions: N/A Aspirin Instructions: Yes Last Dose: 01/29/21  Activity level:  Unable to go up a flight of stairs without symptoms, performs own ADL's      Anesthesia review: N/A  Patient denies shortness of breath, fever, cough and chest pain at PAT appointment   Patient verbalized understanding of instructions that were given to them at the PAT appointment. Patient was also instructed that they will need to review over the PAT instructions again at home before surgery.

## 2021-01-31 ENCOUNTER — Ambulatory Visit (HOSPITAL_COMMUNITY): Payer: Medicare HMO

## 2021-01-31 ENCOUNTER — Ambulatory Visit: Payer: Medicare HMO | Admitting: Gastroenterology

## 2021-01-31 ENCOUNTER — Ambulatory Visit (HOSPITAL_COMMUNITY)
Admission: RE | Admit: 2021-01-31 | Discharge: 2021-02-02 | Disposition: A | Discharge status: Home / Self Care | Payer: Medicare HMO | Attending: Orthopedic Surgery | Admitting: Orthopedic Surgery

## 2021-01-31 ENCOUNTER — Ambulatory Visit (HOSPITAL_COMMUNITY): Payer: Medicare HMO | Admitting: Registered Nurse

## 2021-01-31 ENCOUNTER — Encounter (HOSPITAL_COMMUNITY): Payer: Self-pay | Admitting: Orthopedic Surgery

## 2021-01-31 ENCOUNTER — Encounter (HOSPITAL_COMMUNITY)
Admission: RE | Disposition: A | Discharge status: Home / Self Care | Payer: Self-pay | Source: Home / Self Care | Attending: Orthopedic Surgery

## 2021-01-31 DIAGNOSIS — Z8616 Personal history of COVID-19: Secondary | ICD-10-CM | POA: Diagnosis not present

## 2021-01-31 DIAGNOSIS — Z880 Allergy status to penicillin: Secondary | ICD-10-CM | POA: Insufficient documentation

## 2021-01-31 DIAGNOSIS — Z7951 Long term (current) use of inhaled steroids: Secondary | ICD-10-CM | POA: Diagnosis not present

## 2021-01-31 DIAGNOSIS — S72001A Fracture of unspecified part of neck of right femur, initial encounter for closed fracture: Secondary | ICD-10-CM | POA: Diagnosis not present

## 2021-01-31 DIAGNOSIS — Z886 Allergy status to analgesic agent status: Secondary | ICD-10-CM | POA: Diagnosis not present

## 2021-01-31 DIAGNOSIS — R262 Difficulty in walking, not elsewhere classified: Secondary | ICD-10-CM | POA: Insufficient documentation

## 2021-01-31 DIAGNOSIS — Z85828 Personal history of other malignant neoplasm of skin: Secondary | ICD-10-CM | POA: Diagnosis not present

## 2021-01-31 DIAGNOSIS — Z79890 Hormone replacement therapy (postmenopausal): Secondary | ICD-10-CM | POA: Diagnosis not present

## 2021-01-31 DIAGNOSIS — D62 Acute posthemorrhagic anemia: Secondary | ICD-10-CM | POA: Insufficient documentation

## 2021-01-31 DIAGNOSIS — X58XXXA Exposure to other specified factors, initial encounter: Secondary | ICD-10-CM | POA: Insufficient documentation

## 2021-01-31 DIAGNOSIS — Z79810 Long term (current) use of selective estrogen receptor modulators (SERMs): Secondary | ICD-10-CM | POA: Diagnosis not present

## 2021-01-31 DIAGNOSIS — Z7982 Long term (current) use of aspirin: Secondary | ICD-10-CM | POA: Diagnosis not present

## 2021-01-31 DIAGNOSIS — Z888 Allergy status to other drugs, medicaments and biological substances status: Secondary | ICD-10-CM | POA: Insufficient documentation

## 2021-01-31 DIAGNOSIS — Z79899 Other long term (current) drug therapy: Secondary | ICD-10-CM | POA: Diagnosis not present

## 2021-01-31 DIAGNOSIS — S72041A Displaced fracture of base of neck of right femur, initial encounter for closed fracture: Secondary | ICD-10-CM

## 2021-01-31 DIAGNOSIS — S72011A Unspecified intracapsular fracture of right femur, initial encounter for closed fracture: Secondary | ICD-10-CM | POA: Insufficient documentation

## 2021-01-31 DIAGNOSIS — Z881 Allergy status to other antibiotic agents status: Secondary | ICD-10-CM | POA: Diagnosis not present

## 2021-01-31 DIAGNOSIS — F419 Anxiety disorder, unspecified: Secondary | ICD-10-CM | POA: Diagnosis not present

## 2021-01-31 DIAGNOSIS — E871 Hypo-osmolality and hyponatremia: Secondary | ICD-10-CM | POA: Diagnosis not present

## 2021-01-31 DIAGNOSIS — Z87891 Personal history of nicotine dependence: Secondary | ICD-10-CM | POA: Insufficient documentation

## 2021-01-31 DIAGNOSIS — Z09 Encounter for follow-up examination after completed treatment for conditions other than malignant neoplasm: Secondary | ICD-10-CM

## 2021-01-31 DIAGNOSIS — Z96641 Presence of right artificial hip joint: Secondary | ICD-10-CM | POA: Diagnosis not present

## 2021-01-31 DIAGNOSIS — Z471 Aftercare following joint replacement surgery: Secondary | ICD-10-CM | POA: Diagnosis not present

## 2021-01-31 DIAGNOSIS — E876 Hypokalemia: Secondary | ICD-10-CM | POA: Diagnosis not present

## 2021-01-31 HISTORY — DX: Localized edema: R60.0

## 2021-01-31 HISTORY — PX: TOTAL HIP ARTHROPLASTY: SHX124

## 2021-01-31 HISTORY — DX: Peripheral vascular disease, unspecified: I73.9

## 2021-01-31 HISTORY — DX: Unspecified malignant neoplasm of skin, unspecified: C44.90

## 2021-01-31 HISTORY — DX: Edema, unspecified: R60.9

## 2021-01-31 HISTORY — DX: Psoriasis, unspecified: L40.9

## 2021-01-31 HISTORY — DX: Polyneuropathy, unspecified: G62.9

## 2021-01-31 LAB — COMPREHENSIVE METABOLIC PANEL
ALT: 16 U/L (ref 0–44)
AST: 21 U/L (ref 15–41)
Albumin: 4.1 g/dL (ref 3.5–5.0)
Alkaline Phosphatase: 64 U/L (ref 38–126)
Anion gap: 9 (ref 5–15)
BUN: 19 mg/dL (ref 8–23)
CO2: 25 mmol/L (ref 22–32)
Calcium: 9.1 mg/dL (ref 8.9–10.3)
Chloride: 104 mmol/L (ref 98–111)
Creatinine, Ser: 0.85 mg/dL (ref 0.44–1.00)
GFR, Estimated: >60 mL/min (ref >60)
Glucose, Bld: 106 mg/dL — ABNORMAL HIGH (ref 70–99)
Potassium: 3.4 mmol/L — ABNORMAL LOW (ref 3.5–5.1)
Sodium: 138 mmol/L (ref 135–145)
Total Bilirubin: 0.9 mg/dL (ref 0.3–1.2)
Total Protein: 6.8 g/dL (ref 6.5–8.1)

## 2021-01-31 LAB — CBC
HCT: 36.3 % (ref 36.0–46.0)
Hemoglobin: 11.7 g/dL — ABNORMAL LOW (ref 12.0–15.0)
MCH: 30.5 pg (ref 26.0–34.0)
MCHC: 32.2 g/dL (ref 30.0–36.0)
MCV: 94.8 fL (ref 80.0–100.0)
Platelets: 191 10*3/uL (ref 150–400)
RBC: 3.83 MIL/uL — ABNORMAL LOW (ref 3.87–5.11)
RDW: 15.3 % (ref 11.5–15.5)
WBC: 8 10*3/uL (ref 4.0–10.5)
nRBC: 0 % (ref 0.0–0.2)

## 2021-01-31 LAB — URINALYSIS, ROUTINE W REFLEX MICROSCOPIC
Bilirubin Urine: NEGATIVE
Glucose, UA: NEGATIVE mg/dL
Hgb urine dipstick: NEGATIVE
Ketones, ur: NEGATIVE mg/dL
Leukocytes,Ua: NEGATIVE
Nitrite: NEGATIVE
Protein, ur: NEGATIVE mg/dL
Specific Gravity, Urine: 1.02 (ref 1.005–1.030)
pH: 6 (ref 5.0–8.0)

## 2021-01-31 LAB — TYPE AND SCREEN
ABO/RH(D): O POS
Antibody Screen: NEGATIVE

## 2021-01-31 LAB — PROTIME-INR
INR: 1.1 (ref 0.8–1.2)
Prothrombin Time: 13.8 seconds (ref 11.4–15.2)

## 2021-01-31 LAB — ABO/RH: ABO/RH(D): O POS

## 2021-01-31 SURGERY — ARTHROPLASTY, HIP, TOTAL, ANTERIOR APPROACH
Anesthesia: Spinal | Site: Hip | Laterality: Right

## 2021-01-31 MED ORDER — PHENYLEPHRINE HCL-NACL 20-0.9 MG/250ML-% IV SOLN
INTRAVENOUS | Status: DC | PRN
  Administered 2021-01-31: 25 ug/min via INTRAVENOUS

## 2021-01-31 MED ORDER — HYDROCODONE-ACETAMINOPHEN 7.5-325 MG PO TABS
1.0000 | ORAL_TABLET | ORAL | Status: DC | PRN
Start: 2021-01-31 — End: 2021-02-02
  Administered 2021-01-31: 2 via ORAL
  Administered 2021-01-31 – 2021-02-01 (×3): 1 via ORAL
  Filled 2021-01-31: qty 2
  Filled 2021-01-31 (×2): qty 1

## 2021-01-31 MED ORDER — ORAL CARE MOUTH RINSE: 15.0000 mL | Freq: Once | OROMUCOSAL | Status: AC

## 2021-01-31 MED ORDER — DEXAMETHASONE SODIUM PHOSPHATE 10 MG/ML IJ SOLN
10.0000 mg | Freq: Once | INTRAMUSCULAR | Status: AC
  Administered 2021-02-01: 10 mg via INTRAVENOUS
  Filled 2021-01-31: qty 1

## 2021-01-31 MED ORDER — ACETAMINOPHEN 10 MG/ML IV SOLN
1000.0000 mg | Freq: Once | INTRAVENOUS | Status: AC
  Administered 2021-01-31: 1000 mg via INTRAVENOUS
  Filled 2021-01-31: qty 100

## 2021-01-31 MED ORDER — ONDANSETRON HCL 4 MG/2ML IJ SOLN
INTRAMUSCULAR | Status: DC | PRN
  Administered 2021-01-31: 4 mg via INTRAVENOUS

## 2021-01-31 MED ORDER — MIDAZOLAM HCL 5 MG/5ML IJ SOLN
INTRAMUSCULAR | Status: DC | PRN
  Administered 2021-01-31 (×2): 1 mg via INTRAVENOUS

## 2021-01-31 MED ORDER — BUPIVACAINE-EPINEPHRINE (PF) 0.25% -1:200000 IJ SOLN
INTRAMUSCULAR | Status: AC
  Filled 2021-01-31: qty 30

## 2021-01-31 MED ORDER — CEFAZOLIN SODIUM-DEXTROSE 2-4 GM/100ML-% IV SOLN
2.0000 g | INTRAVENOUS | Status: AC
  Administered 2021-01-31: 2 g via INTRAVENOUS
  Filled 2021-01-31: qty 100

## 2021-01-31 MED ORDER — DEXAMETHASONE SODIUM PHOSPHATE 10 MG/ML IJ SOLN
INTRAMUSCULAR | Status: DC | PRN
  Administered 2021-01-31: 8 mg via INTRAVENOUS

## 2021-01-31 MED ORDER — LINACLOTIDE 145 MCG PO CAPS
145.0000 ug | ORAL_CAPSULE | Freq: Every day | ORAL | Status: DC
  Administered 2021-02-01 – 2021-02-02 (×2): 145 ug via ORAL
  Filled 2021-01-31 (×2): qty 1

## 2021-01-31 MED ORDER — LACTATED RINGERS IV SOLN: INTRAVENOUS | Status: DC

## 2021-01-31 MED ORDER — METHOCARBAMOL 500 MG PO TABS
500.0000 mg | ORAL_TABLET | Freq: Four times a day (QID) | ORAL | Status: DC | PRN
  Administered 2021-02-01: 500 mg via ORAL
  Filled 2021-01-31: qty 1

## 2021-01-31 MED ORDER — ISOPROPYL ALCOHOL 70 % SOLN
Status: DC | PRN
  Administered 2021-01-31: 1 via TOPICAL

## 2021-01-31 MED ORDER — ACETAMINOPHEN 325 MG PO TABS: 325.0000 mg | ORAL_TABLET | Freq: Four times a day (QID) | ORAL | Status: DC | PRN

## 2021-01-31 MED ORDER — FENTANYL CITRATE (PF) 100 MCG/2ML IJ SOLN: 25.0000 ug | INTRAMUSCULAR | Status: DC | PRN

## 2021-01-31 MED ORDER — ONDANSETRON HCL 4 MG PO TABS: 4.0000 mg | ORAL_TABLET | Freq: Four times a day (QID) | ORAL | Status: DC | PRN

## 2021-01-31 MED ORDER — VENLAFAXINE HCL ER 37.5 MG PO CP24
37.5000 mg | ORAL_CAPSULE | Freq: Every day | ORAL | Status: DC
  Administered 2021-02-01 – 2021-02-02 (×2): 37.5 mg via ORAL
  Filled 2021-01-31 (×3): qty 1

## 2021-01-31 MED ORDER — BUPIVACAINE IN DEXTROSE 0.75-8.25 % IT SOLN
INTRATHECAL | Status: DC | PRN
  Administered 2021-01-31: 12 mL via INTRATHECAL

## 2021-01-31 MED ORDER — ALBUTEROL SULFATE HFA 108 (90 BASE) MCG/ACT IN AERS: 2.0000 | INHALATION_SPRAY | Freq: Four times a day (QID) | RESPIRATORY_TRACT | Status: DC | PRN

## 2021-01-31 MED ORDER — POVIDONE-IODINE 10 % EX SWAB: 2.0000 "application " | Freq: Once | CUTANEOUS | Status: DC

## 2021-01-31 MED ORDER — ACETAMINOPHEN 500 MG PO TABS: 1000.0000 mg | ORAL_TABLET | Freq: Once | ORAL | Status: DC

## 2021-01-31 MED ORDER — POVIDONE-IODINE 10 % EX SWAB
2.0000 "application " | Freq: Once | CUTANEOUS | Status: AC
  Administered 2021-01-31: 2 via TOPICAL

## 2021-01-31 MED ORDER — ONDANSETRON HCL 4 MG/2ML IJ SOLN
INTRAMUSCULAR | Status: AC
  Filled 2021-01-31: qty 2

## 2021-01-31 MED ORDER — SODIUM CHLORIDE (PF) 0.9 % IJ SOLN
INTRAMUSCULAR | Status: AC
  Filled 2021-01-31: qty 30

## 2021-01-31 MED ORDER — TRANEXAMIC ACID-NACL 1000-0.7 MG/100ML-% IV SOLN
1000.0000 mg | INTRAVENOUS | Status: DC
  Filled 2021-01-31 (×3): qty 100

## 2021-01-31 MED ORDER — SODIUM CHLORIDE 0.9 % IR SOLN
Status: DC | PRN
  Administered 2021-01-31: 3000 mL

## 2021-01-31 MED ORDER — CELECOXIB 200 MG PO CAPS
200.0000 mg | ORAL_CAPSULE | Freq: Two times a day (BID) | ORAL | Status: DC
  Administered 2021-01-31 – 2021-02-02 (×4): 200 mg via ORAL
  Filled 2021-01-31 (×4): qty 1

## 2021-01-31 MED ORDER — PROPOFOL 10 MG/ML IV BOLUS
INTRAVENOUS | Status: AC
  Filled 2021-01-31: qty 20

## 2021-01-31 MED ORDER — ONDANSETRON HCL 4 MG/2ML IJ SOLN: 4.0000 mg | Freq: Four times a day (QID) | INTRAMUSCULAR | Status: DC | PRN

## 2021-01-31 MED ORDER — PROPOFOL 500 MG/50ML IV EMUL
INTRAVENOUS | Status: DC | PRN
  Administered 2021-01-31: 40 ug/kg/min via INTRAVENOUS

## 2021-01-31 MED ORDER — ALUM & MAG HYDROXIDE-SIMETH 200-200-20 MG/5ML PO SUSP: 30.0000 mL | ORAL | Status: DC | PRN

## 2021-01-31 MED ORDER — CHLORHEXIDINE GLUCONATE CLOTH 2 % EX PADS
6.0000 | MEDICATED_PAD | Freq: Every day | CUTANEOUS | Status: DC
  Administered 2021-02-01 – 2021-02-02 (×2): 6 via TOPICAL

## 2021-01-31 MED ORDER — DEXAMETHASONE SODIUM PHOSPHATE 10 MG/ML IJ SOLN
INTRAMUSCULAR | Status: AC
  Filled 2021-01-31: qty 1

## 2021-01-31 MED ORDER — SODIUM CHLORIDE 0.9 % IV SOLN: INTRAVENOUS | Status: DC

## 2021-01-31 MED ORDER — KETOROLAC TROMETHAMINE 30 MG/ML IJ SOLN
INTRAMUSCULAR | Status: AC
  Filled 2021-01-31: qty 1

## 2021-01-31 MED ORDER — METOCLOPRAMIDE HCL 5 MG/ML IJ SOLN: 5.0000 mg | Freq: Three times a day (TID) | INTRAMUSCULAR | Status: DC | PRN

## 2021-01-31 MED ORDER — DIPHENHYDRAMINE HCL 12.5 MG/5ML PO ELIX: 12.5000 mg | ORAL_SOLUTION | ORAL | Status: DC | PRN

## 2021-01-31 MED ORDER — TRANEXAMIC ACID-NACL 1000-0.7 MG/100ML-% IV SOLN
INTRAVENOUS | Status: DC | PRN
  Administered 2021-01-31: 1000 mg via INTRAVENOUS

## 2021-01-31 MED ORDER — FLUTICASONE PROPIONATE 50 MCG/ACT NA SUSP: 2.0000 | Freq: Every day | NASAL | Status: DC | PRN

## 2021-01-31 MED ORDER — KETOROLAC TROMETHAMINE 30 MG/ML IJ SOLN
INTRAMUSCULAR | Status: DC | PRN
  Administered 2021-01-31: 30 mg via INTRAMUSCULAR

## 2021-01-31 MED ORDER — MIDAZOLAM HCL 2 MG/2ML IJ SOLN
INTRAMUSCULAR | Status: AC
  Filled 2021-01-31: qty 2

## 2021-01-31 MED ORDER — FENTANYL CITRATE (PF) 100 MCG/2ML IJ SOLN
INTRAMUSCULAR | Status: AC
  Filled 2021-01-31: qty 2

## 2021-01-31 MED ORDER — BUPIVACAINE-EPINEPHRINE 0.25% -1:200000 IJ SOLN
INTRAMUSCULAR | Status: DC | PRN
  Administered 2021-01-31: 30 mL

## 2021-01-31 MED ORDER — DOCUSATE SODIUM 100 MG PO CAPS
100.0000 mg | ORAL_CAPSULE | Freq: Two times a day (BID) | ORAL | Status: DC
  Administered 2021-01-31 – 2021-02-02 (×4): 100 mg via ORAL
  Filled 2021-01-31 (×4): qty 1

## 2021-01-31 MED ORDER — MORPHINE SULFATE (PF) 2 MG/ML IV SOLN
0.5000 mg | INTRAVENOUS | Status: DC | PRN
Start: 2021-01-31 — End: 2021-02-02

## 2021-01-31 MED ORDER — PANTOPRAZOLE SODIUM 40 MG PO TBEC
40.0000 mg | DELAYED_RELEASE_TABLET | Freq: Two times a day (BID) | ORAL | Status: DC
  Administered 2021-01-31 – 2021-02-02 (×4): 40 mg via ORAL
  Filled 2021-01-31 (×4): qty 1

## 2021-01-31 MED ORDER — ALPRAZOLAM 0.5 MG PO TABS
0.5000 mg | ORAL_TABLET | Freq: Three times a day (TID) | ORAL | Status: DC
  Administered 2021-01-31 – 2021-02-02 (×5): 0.5 mg via ORAL
  Filled 2021-01-31 (×5): qty 1

## 2021-01-31 MED ORDER — LEVOTHYROXINE SODIUM 50 MCG PO TABS
50.0000 ug | ORAL_TABLET | Freq: Every day | ORAL | Status: DC
  Administered 2021-02-01 – 2021-02-02 (×2): 50 ug via ORAL
  Filled 2021-01-31 (×2): qty 1

## 2021-01-31 MED ORDER — MOMETASONE FURO-FORMOTEROL FUM 100-5 MCG/ACT IN AERO
2.0000 | INHALATION_SPRAY | Freq: Two times a day (BID) | RESPIRATORY_TRACT | Status: DC
  Administered 2021-01-31 – 2021-02-02 (×3): 2 via RESPIRATORY_TRACT
  Filled 2021-01-31: qty 8.8

## 2021-01-31 MED ORDER — FENTANYL CITRATE (PF) 100 MCG/2ML IJ SOLN
INTRAMUSCULAR | Status: DC | PRN
  Administered 2021-01-31 (×2): 25 ug via INTRAVENOUS

## 2021-01-31 MED ORDER — ASPIRIN 81 MG PO CHEW
81.0000 mg | CHEWABLE_TABLET | Freq: Two times a day (BID) | ORAL | Status: DC
  Administered 2021-01-31 – 2021-02-02 (×4): 81 mg via ORAL
  Filled 2021-01-31 (×4): qty 1

## 2021-01-31 MED ORDER — SODIUM CHLORIDE (PF) 0.9 % IJ SOLN
INTRAMUSCULAR | Status: DC | PRN
  Administered 2021-01-31: 30 mL

## 2021-01-31 MED ORDER — HYDROCODONE-ACETAMINOPHEN 5-325 MG PO TABS
1.0000 | ORAL_TABLET | ORAL | Status: DC | PRN
  Administered 2021-02-01 (×2): 1 via ORAL
  Administered 2021-02-02: 2 via ORAL
  Filled 2021-01-31: qty 1
  Filled 2021-01-31: qty 2
  Filled 2021-01-31: qty 1

## 2021-01-31 MED ORDER — METOCLOPRAMIDE HCL 5 MG PO TABS: 5.0000 mg | ORAL_TABLET | Freq: Three times a day (TID) | ORAL | Status: DC | PRN

## 2021-01-31 MED ORDER — METHOCARBAMOL 500 MG IVPB - SIMPLE MED
500.0000 mg | Freq: Four times a day (QID) | INTRAVENOUS | Status: DC | PRN
  Filled 2021-01-31: qty 50

## 2021-01-31 MED ORDER — PHENOL 1.4 % MT LIQD: 1.0000 | OROMUCOSAL | Status: DC | PRN

## 2021-01-31 MED ORDER — PHENYLEPHRINE HCL (PRESSORS) 10 MG/ML IV SOLN
INTRAVENOUS | Status: AC
  Filled 2021-01-31: qty 2

## 2021-01-31 MED ORDER — WATER FOR IRRIGATION, STERILE IR SOLN
Status: DC | PRN
  Administered 2021-01-31: 2000 mL

## 2021-01-31 MED ORDER — POLYETHYLENE GLYCOL 3350 17 G PO PACK: 17.0000 g | PACK | Freq: Every day | ORAL | Status: DC | PRN

## 2021-01-31 MED ORDER — CEFAZOLIN SODIUM-DEXTROSE 2-4 GM/100ML-% IV SOLN
2.0000 g | Freq: Four times a day (QID) | INTRAVENOUS | Status: AC
  Administered 2021-01-31 – 2021-02-01 (×2): 2 g via INTRAVENOUS
  Filled 2021-01-31 (×2): qty 100

## 2021-01-31 MED ORDER — CHLORHEXIDINE GLUCONATE 0.12 % MT SOLN
15.0000 mL | Freq: Once | OROMUCOSAL | Status: AC
  Administered 2021-01-31: 15 mL via OROMUCOSAL

## 2021-01-31 MED ORDER — PROPOFOL 1000 MG/100ML IV EMUL
INTRAVENOUS | Status: AC
  Filled 2021-01-31: qty 100

## 2021-01-31 MED ORDER — MENTHOL 3 MG MT LOZG: 1.0000 | LOZENGE | OROMUCOSAL | Status: DC | PRN

## 2021-01-31 MED ORDER — AMLODIPINE BESYLATE 5 MG PO TABS
2.5000 mg | ORAL_TABLET | Freq: Every evening | ORAL | Status: DC
  Administered 2021-01-31: 2.5 mg via ORAL
  Filled 2021-01-31 (×2): qty 1

## 2021-01-31 SURGICAL SUPPLY — 63 items
ADH SKN CLS APL DERMABOND .7 (GAUZE/BANDAGES/DRESSINGS) ×1
APL PRP STRL LF DISP 70% ISPRP (MISCELLANEOUS) ×1
BAG DECANTER FOR FLEXI CONT (MISCELLANEOUS) IMPLANT
BAG SPEC THK2 15X12 ZIP CLS (MISCELLANEOUS)
BAG ZIPLOCK 12X15 (MISCELLANEOUS) IMPLANT
BLADE SURG SZ10 CARB STEEL (BLADE) IMPLANT
CHLORAPREP W/TINT 26 (MISCELLANEOUS) ×2 IMPLANT
COVER PERINEAL POST (MISCELLANEOUS) ×2 IMPLANT
COVER SURGICAL LIGHT HANDLE (MISCELLANEOUS) ×2 IMPLANT
COVER WAND RF STERILE (DRAPES) IMPLANT
CUP SECTOR GRIPTON 50MM (Cup) ×1 IMPLANT
DECANTER SPIKE VIAL GLASS SM (MISCELLANEOUS) ×2 IMPLANT
DERMABOND ADVANCED (GAUZE/BANDAGES/DRESSINGS) ×1
DERMABOND ADVANCED .7 DNX12 (GAUZE/BANDAGES/DRESSINGS) ×2 IMPLANT
DRAPE IMP U-DRAPE 54X76 (DRAPES) ×2 IMPLANT
DRAPE SHEET LG 3/4 BI-LAMINATE (DRAPES) ×6 IMPLANT
DRAPE STERI IOBAN 125X83 (DRAPES) IMPLANT
DRAPE U-SHAPE 47X51 STRL (DRAPES) ×4 IMPLANT
DRSG AQUACEL AG ADV 3.5X10 (GAUZE/BANDAGES/DRESSINGS) ×2 IMPLANT
ELECT REM PT RETURN 15FT ADLT (MISCELLANEOUS) ×2 IMPLANT
GAUZE SPONGE 4X4 12PLY STRL (GAUZE/BANDAGES/DRESSINGS) ×2 IMPLANT
GLOVE SRG 8 PF TXTR STRL LF DI (GLOVE) ×1 IMPLANT
GLOVE SURG ENC MOIS LTX SZ8.5 (GLOVE) ×4 IMPLANT
GLOVE SURG ENC TEXT LTX SZ7.5 (GLOVE) ×4 IMPLANT
GLOVE SURG UNDER POLY LF SZ8 (GLOVE) ×2
GLOVE SURG UNDER POLY LF SZ8.5 (GLOVE) ×2 IMPLANT
GOWN SPEC L3 XXLG W/TWL (GOWN DISPOSABLE) ×2 IMPLANT
GOWN STRL REUS W/ TWL LRG LVL3 (GOWN DISPOSABLE) ×1 IMPLANT
GOWN STRL REUS W/TWL LRG LVL3 (GOWN DISPOSABLE) ×2
HANDPIECE INTERPULSE COAX TIP (DISPOSABLE) ×2
HEAD FEM STD 32X+9 STRL (Hips) ×1 IMPLANT
HOLDER FOLEY CATH W/STRAP (MISCELLANEOUS) ×2 IMPLANT
HOOD PEEL AWAY FLYTE STAYCOOL (MISCELLANEOUS) ×8 IMPLANT
JET LAVAGE IRRISEPT WOUND (IRRIGATION / IRRIGATOR) ×2
KIT TURNOVER KIT A (KITS) ×2 IMPLANT
LAVAGE JET IRRISEPT WOUND (IRRIGATION / IRRIGATOR) ×1 IMPLANT
LINER ACETABULAR 32X50 (Liner) ×1 IMPLANT
MANIFOLD NEPTUNE II (INSTRUMENTS) ×2 IMPLANT
MARKER SKIN DUAL TIP RULER LAB (MISCELLANEOUS) ×2 IMPLANT
NDL SAFETY ECLIPSE 18X1.5 (NEEDLE) ×1 IMPLANT
NDL SPNL 18GX3.5 QUINCKE PK (NEEDLE) ×1 IMPLANT
NEEDLE HYPO 18GX1.5 SHARP (NEEDLE) ×2
NEEDLE SPNL 18GX3.5 QUINCKE PK (NEEDLE) ×2 IMPLANT
PACK ANTERIOR HIP CUSTOM (KITS) ×2 IMPLANT
PENCIL SMOKE EVACUATOR (MISCELLANEOUS) IMPLANT
SAW OSC TIP CART 19.5X105X1.3 (SAW) ×2 IMPLANT
SEALER BIPOLAR AQUA 6.0 (INSTRUMENTS) ×2 IMPLANT
SET HNDPC FAN SPRY TIP SCT (DISPOSABLE) ×1 IMPLANT
STEM TRI LOC BPS GRIPTON SZ 2 IMPLANT
SUT ETHIBOND NAB CT1 #1 30IN (SUTURE) ×4 IMPLANT
SUT MNCRL AB 3-0 PS2 18 (SUTURE) ×2 IMPLANT
SUT MNCRL AB 4-0 PS2 18 (SUTURE) ×2 IMPLANT
SUT MON AB 2-0 CT1 36 (SUTURE) ×4 IMPLANT
SUT STRATAFIX PDO 1 14 VIOLET (SUTURE) ×2
SUT STRATFX PDO 1 14 VIOLET (SUTURE) ×1
SUT VIC AB 2-0 CT1 27 (SUTURE) ×2
SUT VIC AB 2-0 CT1 TAPERPNT 27 (SUTURE) ×1 IMPLANT
SUTURE STRATFX PDO 1 14 VIOLET (SUTURE) ×1 IMPLANT
SYR 3ML LL SCALE MARK (SYRINGE) ×2 IMPLANT
TRAY FOLEY MTR SLVR 16FR STAT (SET/KITS/TRAYS/PACK) IMPLANT
TRI LOC BPS W GRIPTON SZ 2 ×2 IMPLANT
TUBE SUCTION HIGH CAP CLEAR NV (SUCTIONS) ×2 IMPLANT
WATER STERILE IRR 1000ML POUR (IV SOLUTION) ×2 IMPLANT

## 2021-01-31 NOTE — H&P (Signed)
PREOPERATIVE H&P  Chief Complaint: Right femoral neck fracture  HPI: [name] is a 80 y.o. female who presents for preoperative history and physical with a diagnosis of Right femoral neck fracture.  Past Medical History:  Diagnosis Date  . Anemia   . Anxiety   . Arthritis   . Asthma   . Constipation   . GERD (gastroesophageal reflux disease)   . High blood pressure   . History of COVID-19 12/28/2020  . Hypothyroidism   . PAD (peripheral artery disease) (HCC)    bilateral legs  . Peripheral edema   . Peripheral neuropathy    bilateral feet at night occ  . Psoriasis   . Seasonal allergies   . Skin cancer    Left eye brow  . Varicose veins   . Vertigo 11/23/2013   Past Surgical History:  Procedure Laterality Date  . APPENDECTOMY     patient unsure  . BIOPSY  12/21/2020   Procedure: BIOPSY;  Surgeon: Daneil Dolin, MD;  [facility];  Service: Endoscopy;;  . CATARACT EXTRACTION W/PHACO Left 01/27/2017   Procedure: CATARACT EXTRACTION PHACO AND INTRAOCULAR LENS PLACEMENT ([place]);  Surgeon: Tonny Branch, MD;  Location: [facility];  Service: Ophthalmology;  Laterality: Left;  CDE: 29.90  . CATARACT EXTRACTION W/PHACO Right 02/24/2017   Procedure: CATARACT EXTRACTION PHACO AND INTRAOCULAR LENS PLACEMENT ([facility]);  Surgeon: Tonny Branch, MD;  Location: [facility];  Service: Ophthalmology;  Laterality: Right;  CDE: 8.57  . CHOLECYSTECTOMY    . COLONOSCOPY N/A 10/01/2016   Procedure: COLONOSCOPY;  Surgeon: Daneil Dolin, MD; pancolonic diverticulosis, melanosis coli, otherwise normal exam.  No recommendations to repeat colonoscopy.  . COLONOSCOPY WITH PROPOFOL N/A 12/21/2020   Procedure: COLONOSCOPY WITH PROPOFOL;  Surgeon: Daneil Dolin, MD;  [facility];  Service: Endoscopy;  Laterality: N/A;  10:30am  . ESOPHAGOGASTRODUODENOSCOPY (EGD) WITH PROPOFOL N/A 12/21/2020   Procedure: ESOPHAGOGASTRODUODENOSCOPY (EGD) WITH PROPOFOL;  Surgeon: Daneil Dolin, MD;   [facility];  Service: Endoscopy;  Laterality: N/A;  . FLEXOR TENDON REPAIR Right 11/06/2017   Procedure: RIGHT WRIST FLEXOR TENDON REPAIRAND STT [place];  Surgeon: Leanora Cover, MD;  Location: [facility];  Service: Orthopedics;  Laterality: Right;  . MOUTH SURGERY     artery bleed  . TUBAL LIGATION     Social History   Socioeconomic History  . Marital status: Married    Spouse name: Not on file  . Number of children: Not on file  . Years of education: Not on file  . Highest education level: Not on file  Occupational History  . Not on file  Tobacco Use  . Smoking status: Former Smoker    Packs/day: 0.25    Years: 5.00    Pack years: 1.25    Types: Cigarettes    Quit date: 12/02/1997    Years since quitting: 23.1  . Smokeless tobacco: Never Used  Vaping Use  . Vaping Use: Never used  Substance and Sexual Activity  . Alcohol use: Yes    Alcohol/week: 1.0 standard drink    Types: 1 Glasses of wine per week    Comment: occasional wine  . Drug use: No  . Sexual activity: Yes    Birth control/protection: Surgical    Comment: tubal  Other Topics Concern  . Not on file  Social History Narrative  . Not on file   Social Determinants of Health   Financial Resource Strain: Low Risk   .  Difficulty of Paying Living Expenses: Not hard at all  Food Insecurity: No Food Insecurity  . Worried About Charity fundraiser in the Last Year: Never true  . Ran Out of Food in the Last Year: Never true  Transportation Needs: No Transportation Needs  . Lack of Transportation (Medical): No  . Lack of Transportation (Non-Medical): No  Physical Activity: Inactive  . Days of Exercise per Week: 0 days  . Minutes of Exercise per Session: 0 min  Stress: Stress Concern Present  . Feeling of Stress : To some extent  Social Connections: Socially Integrated  . Frequency of Communication with Friends and Family: More than three times a week  . Frequency of Social  Gatherings with Friends and Family: More than three times a week  . Attends Religious Services: More than 4 times per year  . Active Member of Clubs or Organizations: Yes  . Attends Archivist Meetings: More than 4 times per year  . Marital Status: Married   Family History  Problem Relation Age of Onset  . Hypertension Mother   . Varicose Veins Father   . Heart attack Father   . Heart disease Father   . Hypertension Brother   . COPD Brother    Allergies  Allergen Reactions  . Amoxicillin-Pot Clavulanate Nausea And Vomiting, Other (See Comments), Itching and Nausea Only    Has patient had a PCN reaction causing immediate rash, facial/tongue/throat swelling, SOB or lightheadedness with hypotension: No Has patient had a PCN reaction causing severe rash involving mucus membranes or skin necrosis: No Has patient had a PCN reaction that required hospitalization: No Has patient had a PCN reaction occurring within the last 10 years: No If all of the above answers are "NO", then may proceed with Cephalosporin use. Has patient had a PCN reaction causing immediate rash, facial/tongue/throat swelling, SOB or lightheadedness with hypotension: No Has patient had a PCN reaction causing severe rash involving mucus membranes or skin necrosis: No Has patient had a PCN reaction that required hospitalization: No Has patient had a PCN reaction occurring within the last 10 years: No If all of the above answers are "NO", then may proceed with Cephalosporin use.   . Ace Inhibitors Cough  . Entex La Itching  . Levofloxacin Hives and Itching  . Meloxicam Other (See Comments)    (MOBIC) Unknown reaction type (maybe nausea?)   Prior to Admission medications   Medication Sig Start Date End Date Taking? Authorizing Provider  acetaminophen (TYLENOL) 500 MG tablet Take 500 mg by mouth every 6 (six) hours as needed for mild pain or moderate pain.   Yes [provider]  albuterol (VENTOLIN  HFA) 108 (90 Base) MCG/ACT inhaler Inhale 2 puffs into the lungs every 6 (six) hours as needed for wheezing or shortness of breath.   Yes [provider]  ALPRAZolam (XANAX) 0.5 MG tablet Take 0.5 mg by mouth in the morning, at noon, and at bedtime. (1000, 1500, AT BEDTIME)   Yes [provider]  amLODipine (NORVASC) 2.5 MG tablet Take 2.5 mg by mouth every evening.  02/09/13  Yes [provider]  aspirin EC 81 MG tablet Take 81 mg by mouth every evening.   Yes [provider]  chlorhexidine (PERIDEX) 0.12 % solution Use as directed 15 mLs in the mouth or throat 2 (two) times daily as needed (mouth ulcers).   Yes [provider]  Cholecalciferol (VITAMIN D3) 50 MCG (2000 UT) capsule Take 2,000 Units  by mouth daily.   Yes [provider]  fluocinonide gel (LIDEX) 2.50 % Apply 1 application topically 3 (three) times daily as needed (ulcers.). 09/28/20  Yes [provider]  levothyroxine (SYNTHROID) 50 MCG tablet Take 1 tablet (50 mcg total) by mouth daily. Patient taking differently: Take 50 mcg by mouth daily at 6 (six) AM. 08/02/20  Yes Reardon, Juanetta Beets, NP  lidocaine (XYLOCAINE) 2 % solution Use as directed 15 mLs in the mouth or throat as needed for mouth pain. Patient taking differently: Use as directed 15 mLs in the mouth or throat daily as needed for mouth pain. 01/04/21  Yes Avegno, Darrelyn Hillock, FNP  linaclotide (LINZESS) 72 MCG capsule Take 1 capsule (72 mcg total) by mouth daily before breakfast. Patient taking differently: Take 145 mcg by mouth daily before breakfast. 11/08/20  Yes Aliene Altes S, PA-C  losartan (COZAAR) 25 MG tablet Take 25 mg by mouth daily. IN THE MORNING. 07/14/20  Yes [provider]  Multiple Vitamin (MULTIVITAMIN WITH MINERALS) TABS tablet Take 1 tablet by mouth daily in the afternoon.   Yes [provider]  NONFORMULARY OR COMPOUNDED ITEM Apply 1 application topically 3 (three) times daily  as needed (foot neuropathy). neuropathy cream dic 3%/bac 2%/gab %/lid 5%/meth 1%   Yes [provider]  pantoprazole (PROTONIX) 40 MG tablet Take 40 mg by mouth 2 (two) times daily. 01/17/21  Yes [provider]  PREVIDENT 5000 BOOSTER PLUS 1.1 % PSTE Place 1 application onto teeth at bedtime. 09/28/20  Yes [provider]  raloxifene (EVISTA) 60 MG tablet Take 1 tablet (60 mg total) by mouth every morning. Patient taking differently: Take 60 mg by mouth every other day. IN THE MORNING 04/10/20  Yes Estill Dooms, NP  SYMBICORT 80-4.5 MCG/ACT inhaler INHALE 2 PUFFS INTO THE LUNGS TWICE DAILY. Patient taking differently: Inhale 2 puffs into the lungs daily. 02/02/18  Yes Tanda Rockers, MD  Turmeric Curcumin 500 MG CAPS Take 500 mg by mouth daily.   Yes [provider]  venlafaxine XR (EFFEXOR-XR) 37.5 MG 24 hr capsule Take 37.5 mg by mouth daily with breakfast.  07/10/20  Yes [provider]  benzonatate (TESSALON) 100 MG capsule Take 1 capsule (100 mg total) by mouth 3 (three) times daily as needed for cough. Patient not taking: Reported on 01/30/2021 01/04/21   Emerson Monte, FNP  fluticasone (FLONASE) 50 MCG/ACT nasal spray Place 2 sprays into both nostrils daily as needed for allergies.    [name], MD  mupirocin ointment (BACTROBAN) 2 % Apply 1 application topically daily. Patient taking differently: Apply 1 application topically daily as needed (wound care). 11/06/20   Trula Slade, DPM     Positive ROS: All other systems have been reviewed and were otherwise negative with the exception of those mentioned in the HPI and as above.  Physical Exam: General: Alert, no acute distress Cardiovascular: No pedal edema Respiratory: No cyanosis, no use of accessory musculature GI: No organomegaly, abdomen is soft and non-tender Skin: No lesions in the area of chief complaint Neurologic: Sensation intact distally Psychiatric: Patient is  competent for consent with normal mood and affect Lymphatic: No axillary or cervical lymphadenopathy  MUSCULOSKELETAL: Skin intact. Pain with SLR. NVI.  Assessment: Right femoral neck fracture  Plan: Plan for Procedure(s): TOTAL HIP ARTHROPLASTY ANTERIOR APPROACH  The risks, benefits, and alternatives were discussed with the patient. There are risks associated with the surgery including, but not limited to,  problems with anesthesia (death), infection, instability (giving out of the joint), dislocation, differences in leg length/angulation/rotation, fracture of bones, loosening or failure of implants, hematoma (blood accumulation) which may require surgical drainage, blood clots, pulmonary embolism, nerve injury (foot drop and lateral thigh numbness), and blood vessel injury. The patient understands these risks and elects to proceed.  Bertram Savin, MD [phone]   01/31/2021 10:38 AM

## 2021-01-31 NOTE — Op Note (Signed)
OPERATIVE REPORT  SURGEON: Rod Can, MD   ASSISTANT: Cherlynn June, PA-C  PREOPERATIVE DIAGNOSIS: Displaced Right femoral neck fracture.   POSTOPERATIVE DIAGNOSIS: Displaced Right femoral neck fracture.   PROCEDURE: Right total hip arthroplasty, anterior approach.   IMPLANTS: DePuy Tri Lock stem, size 2, hi offset. DePuy Pinnacle Cup, size 50 mm. DePuy Altrx liner, size 32 by 50 mm, neutral. DePuy metal head ball, size 32 + 9 mm.  ANESTHESIA:  MAC and Spinal  ANTIBIOTICS: 2g ancef.  ESTIMATED BLOOD LOSS:-350 mL    DRAINS: None.  COMPLICATIONS: None   CONDITION: PACU - hemodynamically stable.   BRIEF CLINICAL NOTE: [name] is a 80 y.o. female with a displaced Right femoral neck fracture. The patient was admitted to the hospitalist service and underwent perioperative risk stratification and medical optimization. The risks, benefits, and alternatives to total hip arthroplasty were explained, and the patient elected to proceed.  PROCEDURE IN DETAIL: The patient was taken to the operating room and general anesthesia was induced on the hospital bed.  The patient was then positioned on the Hana table.  All bony prominences were well padded.  The hip was prepped and draped in the normal sterile surgical fashion.  A time-out was called verifying side and site of surgery. Antibiotics were given within 60 minutes of beginning the procedure.  The direct anterior approach to the hip was performed through the Hueter interval.  Lateral femoral circumflex vessels were treated with the Auqumantys. The anterior capsule was exposed and an inverted T capsulotomy was made.  Fracture hematoma was encountered and evacuated. The patient was found to have a comminuted Right subcapital femoral neck fracture.  I freshened the femoral neck cut with a saw.  I removed the femoral neck fragment.  A corkscrew was placed into the head and the head was removed.  This was passed to the back table  and was measured.   Acetabular exposure was achieved, and the pulvinar and labrum were excised. Sequential reaming of the acetabulum was then performed up to a size 49 mm reamer. A 50 mm cup was then opened and impacted into place at approximately 40 degrees of abduction and 20 degrees of anteversion. The final polyethylene liner was impacted into place.    I then gained femoral exposure taking care to protect the abductors and greater trochanter.  This was performed using standard external rotation, extension, and adduction.  The capsule was peeled off the inner aspect of the greater trochanter, taking care to preserve the short external rotators. A cookie cutter was used to enter the femoral canal, and then the femoral canal finder was used to confirm location.  I then sequentially broached up to a size 2.  Calcar planer was used on the femoral neck remnant.  I paced a hi neck and a trial head ball. The hip was reduced.  Leg lengths were checked fluoroscopically.  The hip was dislocated and trial components were removed.  I placed the real stem followed by the real spacer and head ball.  The hip was reduced.  Fluoroscopy was used to confirm component position and leg lengths.  At 90 degrees of external rotation and extension, the hip was stable to an anterior directed force.   The wound was copiously irrigated with Irrisept solution and normal saline using pule lavage.  Marcaine solution was injected into the periarticular soft tissue.  The wound was closed in layers using #1 Vicryl and V-Loc for the fascia, 2-0 Vicryl for the subcutaneous  fat, 2-0 Monocryl for the deep dermal layer, 3-0 running Monocryl subcuticular stitch and glue for the skin.  Once the glue was fully dried, an Aquacell Ag dressing was applied.  The patient was then awakened from anesthesia and transported to the recovery room in stable condition.  Sponge, needle, and instrument counts were correct at the end of the case x2.  The patient  tolerated the procedure well and there were no known complications.  Please note that a surgical assistant was a medical necessity for this procedure to perform it in a safe and expeditious manner. Assistant was necessary to provide appropriate retraction of vital neurovascular structures, to prevent femoral fracture, and to allow for anatomic placement of the prosthesis.

## 2021-01-31 NOTE — Anesthesia Postprocedure Evaluation (Signed)
Anesthesia Post Note  Patient: ROSEMARIE GALVIS  Procedure(s) Performed: TOTAL HIP ARTHROPLASTY ANTERIOR APPROACH (Right Hip)     Patient location during evaluation: PACU Anesthesia Type: Spinal Level of consciousness: awake and alert, patient cooperative and oriented Pain management: pain level controlled Vital Signs Assessment: post-procedure vital signs reviewed and stable Respiratory status: spontaneous breathing, nonlabored ventilation and respiratory function stable Cardiovascular status: blood pressure returned to baseline and stable Postop Assessment: no apparent nausea or vomiting, patient able to bend at knees, spinal receding and adequate PO intake Anesthetic complications: no   No complications documented.  Last Vitals:  Vitals:   01/31/21 1515 01/31/21 1525  BP: 122/88 133/83  Pulse: (!) 56 78  Resp: (!) 21 18  Temp:  36.6 C  SpO2: 100% 100%    Last Pain:  Vitals:   01/31/21 1525  TempSrc: Oral  PainSc:                  JACKSON,E. CARSWELL

## 2021-01-31 NOTE — Anesthesia Procedure Notes (Signed)
Spinal  Patient location during procedure: OR End time: 01/31/2021 12:21 PM Staffing Performed: anesthesiologist  Anesthesiologist: Annye Asa, MD Resident/CRNA: Victoriano Lain, CRNA Preanesthetic Checklist Completed: patient identified, IV checked, site marked, risks and benefits discussed, surgical consent, monitors and equipment checked, pre-op evaluation and timeout performed Spinal Block Patient position: sitting Prep: DuraPrep and site prepped and draped Patient monitoring: blood pressure, continuous pulse ox, cardiac monitor and heart rate Approach: midline Location: L3-4 Injection technique: single-shot Needle Needle type: Quincke  Needle gauge: 22 G Needle length: 9 cm Additional Notes Pt identified in Operating room.  Monitors applied. Working IV access confirmed. Sterile prep, drape lumbar spine.  1% lido local L 3,4.  CRNA and MD unsuccessful with #24ga Pencan, changed to #22ga Quincke and clear CSF L 3,4.  12mg  0.75% Bupivacaine with dextrose injected with asp CSF beginning and end of injection.  Patient asymptomatic, VSS, no heme aspirated, tolerated well.  Jenita Seashore, MD

## 2021-01-31 NOTE — Discharge Instructions (Signed)
°Dr. Carlen Fils °Joint Replacement Specialist °[facility] °[address] °[place], [place] [address] °[phone] ° ° °TOTAL HIP REPLACEMENT POSTOPERATIVE DIRECTIONS ° ° ° °Hip Rehabilitation, Guidelines Following Surgery  ° °WEIGHT BEARING °Weight bearing as tolerated with assist device (walker, cane, etc) as directed, use it as long as suggested by your surgeon or therapist, typically at least 4-6 weeks. ° °The results of a hip operation are greatly improved after range of motion and muscle strengthening exercises. Follow all safety measures which are given to protect your hip. If any of these exercises cause increased pain or swelling in your joint, decrease the amount until you are comfortable again. Then slowly increase the exercises. Call your caregiver if you have problems or questions.  ° °HOME CARE INSTRUCTIONS  °Most of the following instructions are designed to prevent the dislocation of your new hip.  °Remove items at home which could result in a fall. This includes throw rugs or furniture in walking pathways.  °Continue medications as instructed at time of discharge. °· You may have some home medications which will be placed on hold until you complete the course of blood thinner medication. °· You may start showering once you are discharged home. Do not remove your dressing. °Do not put on socks or shoes without following the instructions of your caregivers.   °Sit on chairs with arms. Use the chair arms to help push yourself up when arising.  °Arrange for the use of a toilet seat elevator so you are not sitting low.  °· Walk with walker as instructed.  °You may resume a sexual relationship in one month or when given the OK by your caregiver.  °Use walker as long as suggested by your caregivers.  °You may put full weight on your legs and walk as much as is comfortable. °Avoid periods of inactivity such as sitting longer than an hour when not asleep. This helps prevent  blood clots.  °You may return to work once you are cleared by your surgeon.  °Do not drive a car for 6 weeks or until released by your surgeon.  °Do not drive while taking narcotics.  °Wear elastic stockings for two weeks following surgery during the day but you may remove then at night.  °Make sure you keep all of your appointments after your operation with all of your doctors and caregivers. You should call the office at the above phone number and make an appointment for approximately two weeks after the date of your surgery. °Please pick up a stool softener and laxative for home use as long as you are requiring pain medications. °· ICE to the affected hip every three hours for 30 minutes at a time and then as needed for pain and swelling. Continue to use ice on the hip for pain and swelling from surgery. You may notice swelling that will progress down to the foot and ankle.  This is normal after surgery.  Elevate the leg when you are not up walking on it.   °It is important for you to complete the blood thinner medication as prescribed by your doctor. °· Continue to use the breathing machine which will help keep your temperature down.  It is common for your temperature to cycle up and down following surgery, especially at night when you are not up moving around and exerting yourself.  The breathing machine keeps your lungs expanded and your temperature down. ° °RANGE OF MOTION AND STRENGTHENING EXERCISES  °These exercises are   designed to help you keep full movement of your hip joint. Follow your caregiver's or physical therapist's instructions. Perform all exercises about fifteen times, three times per day or as directed. Exercise both hips, even if you have had only one joint replacement. These exercises can be done on a training (exercise) mat, on the floor, on a table or on a bed. Use whatever works the best and is most comfortable for you. Use music or television while you are exercising so that the exercises  are a pleasant break in your day. This will make your life better with the exercises acting as a break in routine you can look forward to.  °Lying on your back, slowly slide your foot toward your buttocks, raising your knee up off the floor. Then slowly slide your foot back down until your leg is straight again.  °Lying on your back spread your legs as far apart as you can without causing discomfort.  °Lying on your side, raise your upper leg and foot straight up from the floor as far as is comfortable. Slowly lower the leg and repeat.  °Lying on your back, tighten up the muscle in the front of your thigh (quadriceps muscles). You can do this by keeping your leg straight and trying to raise your heel off the floor. This helps strengthen the largest muscle supporting your knee.  °Lying on your back, tighten up the muscles of your buttocks both with the legs straight and with the knee bent at a comfortable angle while keeping your heel on the floor.  ° °SKILLED REHAB INSTRUCTIONS: °If the patient is transferred to a skilled rehab facility following release from the hospital, a list of the current medications will be sent to the facility for the patient to continue.  When discharged from the skilled rehab facility, please have the facility set up the patient's Home Health Physical Therapy prior to being released. Also, the skilled facility will be responsible for providing the patient with their medications at time of release from the facility to include their pain medication and their blood thinner medication. If the patient is still at the rehab facility at time of the two week follow up appointment, the skilled rehab facility will also need to assist the patient in arranging follow up appointment in our office and any transportation needs. ° °MAKE SURE YOU:  °Understand these instructions.  °Will watch your condition.  °Will get help right away if you are not doing well or get worse. ° °Pick up stool softner and  laxative for home use following surgery while on pain medications. °Do not remove your dressing. °The dressing is waterproof--it is OK to take showers. °Continue to use ice for pain and swelling after surgery. °Do not use any lotions or creams on the incision until instructed by your surgeon. °Total Hip Protocol. ° ° °

## 2021-01-31 NOTE — Anesthesia Preprocedure Evaluation (Addendum)
Anesthesia Evaluation  Patient identified by MRN, date of birth, ID band Patient awake    Reviewed: Allergy & Precautions, NPO status , Patient's Chart, lab work & pertinent test results  History of Anesthesia Complications Negative for: history of anesthetic complications  Airway Mallampati: II  TM Distance: >3 FB Neck ROM: Full    Dental  (+) Dental Advisory Given   Pulmonary COPD,  COPD inhaler, former smoker,  12/28/2020 SARS coronavirus POS   breath sounds clear to auscultation       Cardiovascular hypertension, Pt. on medications (-) angina+ Peripheral Vascular Disease   Rhythm:Regular Rate:Normal     Neuro/Psych Anxiety negative neurological ROS     GI/Hepatic Neg liver ROS, GERD  Medicated and Controlled,  Endo/Other  Hypothyroidism   Renal/GU negative Renal ROS     Musculoskeletal  (+) Arthritis ,   Abdominal   Peds  Hematology negative hematology ROS (+)   Anesthesia Other Findings   Reproductive/Obstetrics                            Anesthesia Physical Anesthesia Plan  ASA: III  Anesthesia Plan: Spinal   Post-op Pain Management:    Induction:   PONV Risk Score and Plan: 2 and Ondansetron and Treatment may vary due to age or medical condition  Airway Management Planned: Natural Airway and Simple Face Mask  Additional Equipment: None  Intra-op Plan:   Post-operative Plan:   Informed Consent: I have reviewed the patients History and Physical, chart, labs and discussed the procedure including the risks, benefits and alternatives for the proposed anesthesia with the patient or authorized representative who has indicated his/her understanding and acceptance.     Dental advisory given  Plan Discussed with: CRNA and Surgeon  Anesthesia Plan Comments:        Anesthesia Quick Evaluation

## 2021-01-31 NOTE — Transfer of Care (Signed)
Immediate Anesthesia Transfer of Care Note  Patient: [name]  Procedure(s) Performed: TOTAL HIP ARTHROPLASTY ANTERIOR APPROACH (Right Hip)  Patient Location: PACU  Anesthesia Type:MAC and Spinal  Level of Consciousness: awake, alert , oriented and patient cooperative  Airway & Oxygen Therapy: Patient Spontanous Breathing and Patient connected to face mask oxygen  Post-op Assessment: Report given to RN and Post -op Vital signs reviewed and stable  Post vital signs: Reviewed and stable  Last Vitals:  Vitals Value Taken Time  BP 127/75 01/31/21 1412  Temp    Pulse 80 01/31/21 1415  Resp 13 01/31/21 1415  SpO2 100 % 01/31/21 1415  Vitals shown include unvalidated device data.  Last Pain:  Vitals:   01/31/21 0927  TempSrc:   PainSc: 0-No pain         Complications: No complications documented.

## 2021-02-01 ENCOUNTER — Encounter (HOSPITAL_COMMUNITY): Payer: Self-pay | Admitting: Orthopedic Surgery

## 2021-02-01 DIAGNOSIS — R262 Difficulty in walking, not elsewhere classified: Secondary | ICD-10-CM | POA: Diagnosis not present

## 2021-02-01 DIAGNOSIS — D62 Acute posthemorrhagic anemia: Secondary | ICD-10-CM | POA: Diagnosis not present

## 2021-02-01 DIAGNOSIS — Z85828 Personal history of other malignant neoplasm of skin: Secondary | ICD-10-CM | POA: Diagnosis not present

## 2021-02-01 DIAGNOSIS — Z8616 Personal history of COVID-19: Secondary | ICD-10-CM | POA: Diagnosis not present

## 2021-02-01 DIAGNOSIS — Z881 Allergy status to other antibiotic agents status: Secondary | ICD-10-CM | POA: Diagnosis not present

## 2021-02-01 DIAGNOSIS — S72011A Unspecified intracapsular fracture of right femur, initial encounter for closed fracture: Secondary | ICD-10-CM | POA: Diagnosis not present

## 2021-02-01 DIAGNOSIS — Z87891 Personal history of nicotine dependence: Secondary | ICD-10-CM | POA: Diagnosis not present

## 2021-02-01 DIAGNOSIS — Z888 Allergy status to other drugs, medicaments and biological substances status: Secondary | ICD-10-CM | POA: Diagnosis not present

## 2021-02-01 DIAGNOSIS — Z880 Allergy status to penicillin: Secondary | ICD-10-CM | POA: Diagnosis not present

## 2021-02-01 LAB — CBC
HCT: 26.6 % — ABNORMAL LOW (ref 36.0–46.0)
Hemoglobin: 8.7 g/dL — ABNORMAL LOW (ref 12.0–15.0)
MCH: 31.1 pg (ref 26.0–34.0)
MCHC: 32.7 g/dL (ref 30.0–36.0)
MCV: 95 fL (ref 80.0–100.0)
Platelets: 167 10*3/uL (ref 150–400)
RBC: 2.8 MIL/uL — ABNORMAL LOW (ref 3.87–5.11)
RDW: 14.9 % (ref 11.5–15.5)
WBC: 12.9 10*3/uL — ABNORMAL HIGH (ref 4.0–10.5)
nRBC: 0 % (ref 0.0–0.2)

## 2021-02-01 LAB — BASIC METABOLIC PANEL
Anion gap: 8 (ref 5–15)
BUN: 16 mg/dL (ref 8–23)
CO2: 23 mmol/L (ref 22–32)
Calcium: 7.8 mg/dL — ABNORMAL LOW (ref 8.9–10.3)
Chloride: 106 mmol/L (ref 98–111)
Creatinine, Ser: 0.78 mg/dL (ref 0.44–1.00)
GFR, Estimated: >60 mL/min (ref >60)
Glucose, Bld: 189 mg/dL — ABNORMAL HIGH (ref 70–99)
Potassium: 3.6 mmol/L (ref 3.5–5.1)
Sodium: 137 mmol/L (ref 135–145)

## 2021-02-01 MED ORDER — DOCUSATE SODIUM 100 MG PO CAPS: 100.0000 mg | ORAL_CAPSULE | Freq: Two times a day (BID) | ORAL | 0 refills | Status: DC

## 2021-02-01 MED ORDER — SENNA 8.6 MG PO TABS: 1.0000 | ORAL_TABLET | Freq: Two times a day (BID) | ORAL | 0 refills | Status: DC

## 2021-02-01 MED ORDER — ONDANSETRON HCL 4 MG PO TABS: 4.0000 mg | ORAL_TABLET | Freq: Four times a day (QID) | ORAL | 0 refills | Status: DC | PRN

## 2021-02-01 MED ORDER — ASPIRIN 81 MG PO CHEW: 81.0000 mg | CHEWABLE_TABLET | Freq: Two times a day (BID) | ORAL | 0 refills | Status: DC

## 2021-02-01 MED ORDER — HYDROCODONE-ACETAMINOPHEN 5-325 MG PO TABS: 1.0000 | ORAL_TABLET | ORAL | 0 refills | Status: DC | PRN

## 2021-02-01 NOTE — Progress Notes (Signed)
Physical Therapy Treatment Patient Details Name: [name] MRN: [id number] DOB: 05/06/1941 Today's Date: 02/01/2021    History of Present Illness Pt s/p R hip fx and now s/p R THR by anterior direct approach.  Pt with hx of PAD, peripheral neuropathy and elevated anxiety    PT Comments    Pt in good spirits and up to bathroom for toileting and hygiene at sink.  Pt reviewed car transfers and negotiated stairs but c/o "feeling hot and tired" on return to bedside - BP 94/66 - RN aware.   Follow Up Recommendations  Home health PT;Follow surgeon's recommendation for DC plan and follow-up therapies     Equipment Recommendations  None recommended by PT    Recommendations for Other Services OT consult     Precautions / Restrictions Precautions Precautions: Fall Restrictions Weight Bearing Restrictions: No RLE Weight Bearing: Weight bearing as tolerated    Mobility  Bed Mobility Overal bed mobility: Needs Assistance Bed Mobility: Sit to Supine       Sit to supine: Min guard   General bed mobility comments: cues for sequence and use of L LE to self assist    Transfers Overall transfer level: Needs assistance Equipment used: Rolling walker (2 wheeled) Transfers: Sit to/from Stand Sit to Stand: Min guard         General transfer comment: cues for LE management and use of UEs to self assist  Ambulation/Gait Ambulation/Gait assistance: Min guard Gait Distance (Feet): 55 Feet (and 15' into bathroom) Assistive device: Rolling walker (2 wheeled) Gait Pattern/deviations: Step-to pattern;Decreased step length - right;Decreased step length - left;Shuffle;Trunk flexed Gait velocity: decr   General Gait Details: cues for sequence, posture and position from RW   Stairs Stairs: Yes Stairs assistance: Min assist Stair Management: Step to pattern;Forwards;Two rails Number of Stairs: 4 General stair comments: 2 steps twice using rails at top; cues for  sequence   Wheelchair Mobility    Modified Rankin (Stroke Patients Only)       Balance Overall balance assessment: Needs assistance Sitting-balance support: No upper extremity supported;Feet supported Sitting balance-Leahy Scale: Good     Standing balance support: No upper extremity supported;During functional activity Standing balance-Leahy Scale: Fair                              Cognition Arousal/Alertness: Awake/alert Behavior During Therapy: WFL for tasks assessed/performed Overall Cognitive Status: Within Functional Limits for tasks assessed                                        Exercises      General Comments        Pertinent Vitals/Pain Pain Assessment: 0-10 Pain Score: 5  Pain Location: R hip Pain Descriptors / Indicators: Aching;Sore Pain Intervention(s): Limited activity within patient's tolerance;Monitored during session;Premedicated before session;Ice applied    Home Living                      Prior Function            PT Goals (current goals can now be found in the care plan section) Acute Rehab PT Goals Patient Stated Goal: Regain IND PT Goal Formulation: With patient Time For Goal Achievement: 02/08/21 Potential to Achieve Goals: Good Progress towards PT goals: Progressing toward goals    Frequency  7X/week      PT Plan Current plan remains appropriate    Co-evaluation              AM-PAC PT "6 Clicks" Mobility   Outcome Measure  Help needed turning from your back to your side while in a flat bed without using bedrails?: A Little Help needed moving from lying on your back to sitting on the side of a flat bed without using bedrails?: A Little Help needed moving to and from a bed to a chair (including a wheelchair)?: A Little Help needed standing up from a chair using your arms (e.g., wheelchair or bedside chair)?: A Little Help needed to walk in hospital room?: A Little Help needed  climbing 3-5 steps with a railing? : A Little 6 Click Score: 18    End of Session Equipment Utilized During Treatment: Gait belt Activity Tolerance: Other (comment) (BP dropped) Patient left: in bed;with call bell/phone within reach;with family/visitor present Nurse Communication: Mobility status PT Visit Diagnosis: Difficulty in walking, not elsewhere classified (R26.2)     Time: 0092-3300 PT Time Calculation (min) (ACUTE ONLY): 35 min  Charges:  $Gait Training: 8-22 mins $Therapeutic Activity: 8-22 mins                     [place] Pager [phone] Office [phone]    BRADSHAW,HUNTER 02/01/2021, 4:31 PM

## 2021-02-01 NOTE — TOC Transition Note (Signed)
Transition of Care [facility]) - CM/SW Discharge Note   Patient Details  Name: [name] MRN: [id number] Date of Birth: [birth year]  Transition of Care [facility]) CM/SW Contact:  Lia Hopping, [place] Phone Number: 02/01/2021, 3:00 PM   Clinical Narrative:    CSW discussed home health PT options and provided a list. Daughter chose [facility] (now [facility]). CSW made a referral to the hospital liaison Ronalee Belts.  No other needs identified.    Final next level of care: [place] Barriers to Discharge: No Barriers Identified   Patient Goals and CMS Choice Patient states their goals for this hospitalization and ongoing recovery are:: return home CMS Medicare.gov Compare Post Acute Care list provided to:: Patient Choice offered to / list presented to : Adult Children  Discharge Placement                       Discharge Plan and Services In-house Referral: Clinical Social Work Discharge Planning Services: CM Consult Post Acute Care Choice: Home Health                    HH Arranged: PT [place]: [facility] ([facility] [employer]) Date [place]: 02/01/21 Time [place]: 1500 Representative spoke with at [place]: [place] ([place]) Interventions     Readmission Risk Interventions No flowsheet data found.

## 2021-02-01 NOTE — Progress Notes (Signed)
    Subjective:  Patient reports pain as mild to moderate.  Denies N/V/CP/SOB.   Objective:   VITALS:   Vitals:   02/01/21 0205 02/01/21 0600 02/01/21 0842 02/01/21 1000  BP: 138/89 132/69 130/72 115/73  Pulse: 84 88 85 88  Resp: 17 17  16   Temp: 97.7 F (36.5 C) 98 F (36.7 C) 98.6 F (37 C) 98.4 F (36.9 C)  TempSrc:   Oral Oral  SpO2: 100% 100% 99% 98%  Weight:      Height:        NAD ABD soft Neurovascular intact Sensation intact distally Intact pulses distally Dorsiflexion/Plantar flexion intact Incision: dressing C/D/I   Lab Results  Component Value Date   WBC 12.9 (H) 02/01/2021   HGB 8.7 (L) 02/01/2021   HCT 26.6 (L) 02/01/2021   MCV 95.0 02/01/2021   PLT 167 02/01/2021   BMET    Component Value Date/Time   NA 137 02/01/2021 0319   NA 141 09/24/2018 1204   K 3.6 02/01/2021 0319   CL 106 02/01/2021 0319   CO2 23 02/01/2021 0319   GLUCOSE 189 (H) 02/01/2021 0319   BUN 16 02/01/2021 0319   BUN 13 09/24/2018 1204   CREATININE 0.78 02/01/2021 0319   CREATININE 0.94 (H) 01/22/2021 1419   CALCIUM 7.8 (L) 02/01/2021 0319   GFRNONAA >60 02/01/2021 0319   GFRAA >60 05/08/2020 1232     Assessment/Plan: 1 Day Post-Op   Principal Problem:   Fracture of femoral neck, right (HCC) Active Problems:   Closed displaced fracture of right femoral neck (HCC)   WBAT with walker DVT ppx: Aspirin, SCDs, TEDS ABLA:  HgB 8.7 this AM.  Asymptomatic PO pain control PT/OT Dispo: D/C home once cleared by therapy.  Discussed with patient and daughter about need for someone in home 24/7 for a week.  Daughter voiced understanding and said they would reach out to the company that provides night time home care for the patient's husband and would arrange coverage     Dorothyann Peng 02/01/2021, 11:20 AM [place] is now [employer] [address], [place], [place] [address] Phone:  [phone] [email or web address] Facebook  [employer]

## 2021-02-01 NOTE — TOC Initial Note (Signed)
Transition of Care [facility]) - Initial/Assessment Note    Patient Details  Name: [name] MRN: [id number] Date of Birth: 1940-12-04  Transition of Care [facility]) CM/SW Contact:    Lia Hopping, [place] Phone Number: 02/01/2021, 10:16 AM  Clinical Narrative:                 CSW met with the patient and her daughter at bedside to discuss home care/DME needs. Patient A&OX4. Patient daughter express concerns about the patient not being able to care for her spouse with dementia since having surgery. Daughter reports the patient is her spouse's primary caretaker. Daughter reports they have private personal care services in the evenings however the patient and her spouse will need additional support during the day. CSW explain to the daughter personal care is not covered by [facility] and suggested adding more hours for private care during the day. Daughter report understanding. CSW also explain skilled home health for PT/OT/ nurse aide services and that it is covered by [facility]. Daughter wants to discuss the patient's therapy plan with the physician after the patient has her first physical therapy session. Daughter confirm the patient has RW and 3 in1.  Physician will need to write orders for home health.     Barriers to Discharge: No Barriers Identified   Patient Goals and CMS Choice        Expected Discharge Plan and Services   In-house Referral: Clinical Social Work Discharge Planning Services: CM Consult Post Acute Care Choice: [place] arrangements for the past 2 months: Single Family Home                                      Prior Living Arrangements/Services Living arrangements for the past 2 months: Single Family Home Lives with:: Spouse Patient language and need for interpreter reviewed:: No Do you feel safe going back to the place where you live?: Yes      Need for Family Participation in Patient Care: Yes (Comment) Care giver support system  in place?: Yes (comment) Current home services: DME Criminal Activity/Legal Involvement Pertinent to Current Situation/Hospitalization: No - Comment as needed  Activities of Daily Living Home Assistive Devices/Equipment: Hearing aid,Blood pressure cuff ADL Screening (condition at time of admission) Patient's cognitive ability adequate to safely complete daily activities?: Yes Is the patient deaf or have difficulty hearing?: Yes Does the patient have difficulty seeing, even when wearing glasses/contacts?: No Does the patient have difficulty concentrating, remembering, or making decisions?: No Patient able to express need for assistance with ADLs?: Yes Does the patient have difficulty dressing or bathing?: No Independently performs ADLs?: Yes (appropriate for developmental age) Does the patient have difficulty walking or climbing stairs?: No Weakness of Legs: None Weakness of Arms/Hands: None  Permission Sought/Granted   Permission granted to share information with : Yes, Verbal Permission Granted  Share Information with NAME: Bowen,Carol Daughter   [phone]     Permission granted to share info w Relationship: Daughter     Emotional Assessment Appearance:: Appears stated age Attitude/Demeanor/Rapport: Engaged Affect (typically observed): Accepting,Calm Orientation: : Oriented to Place,Oriented to Self,Oriented to  Time,Oriented to Situation Alcohol / Substance Use: Not Applicable Psych Involvement: No (comment)  Admission diagnosis:  Closed displaced fracture of right femoral neck ([place]) [S72.001A] Patient Active Problem List   Diagnosis Date Noted  . Fracture of femoral neck, right ([place]) 01/31/2021  .  Closed displaced fracture of right femoral neck ([place]) 01/31/2021  . Indigestion 11/02/2020  . Change in stool caliber 11/02/2020  . Loss of weight 08/23/2020  . Nausea without vomiting 06/15/2020  . Tick bite 04/10/2020  . Medication refill 04/10/2020  . Acute maxillary  sinusitis 08/20/2019  . Cellulitis, leg 08/10/2019  . Cellulitis 08/08/2019  . Fever 08/08/2019  . Cellulitis of foot 08/08/2019  . Hypokalemia 08/08/2019  . Hyponatremia 08/08/2019  . Essential hypertension 08/08/2019  . 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  . Adverse food reaction 09/24/2018  . Mild persistent asthma without complication 20/35/5974  . Pain of left hip joint 07/17/2018  . Dyspnea on exertion 01/24/2017  . Cough variant asthma  vs UACS 01/24/2017  . High risk medication use 09/25/2016  . Varicose veins of lower extremities with other complications 16/38/4536  . Varicose veins 02/15/2014  . Vertigo 11/23/2013  . Pain in limb 10/04/2013  . Hip bursitis 02/11/2013  . Constipation 02/16/2009  . RECTAL BLEEDING 02/16/2009  . HIGH BLOOD PRESSURE 01/29/2008   PCP:  [name], MD Pharmacy:   [place], [address] [place] [address] Phone: [phone] Fax: [phone]  [facility] [place], [address] [place] [address] Phone: [phone] Fax: [phone]     Social Determinants of Health (SDOH) Interventions    Readmission Risk Interventions No flowsheet data found.

## 2021-02-01 NOTE — Evaluation (Signed)
Physical Therapy Evaluation Patient Details Name: [name] MRN: [id number] DOB: Nov 17, 1941 Today's Date: 02/01/2021   History of Present Illness  Pt s/p R hip fx and now s/p R THR by anterior direct approach.  Pt with hx of PAD, peripheral neuropathy and elevated anxiety  Clinical Impression  Pt s/p R THR and present with functional mobility limitations 2* decreased R LE strength/ROM, post op pain, and elevated anxiety level limiting functional mobility.  Pt should progress to dc home - dtr is working on arranging 24/7 assist.  This session limited to bed level with OOB deferred 2* pt's elevated anxiety and increasing nausea.    Follow Up Recommendations Home health PT;Follow surgeons recommendation for DC plan and follow-up therapies    Equipment Recommendations  None recommended by PT    Recommendations for Other Services OT consult     Precautions / Restrictions Precautions Precautions: Fall Restrictions Weight Bearing Restrictions: No RLE Weight Bearing: Weight bearing as tolerated      Mobility  Bed Mobility                    Transfers                    Ambulation/Gait                Stairs            Wheelchair Mobility    Modified Rankin (Stroke Patients Only)       Balance                                             Pertinent Vitals/Pain Pain Assessment: 0-10 Pain Score: 5  Pain Location: R hip Pain Descriptors / Indicators: Aching;Sore Pain Intervention(s): Limited activity within patient's tolerance;Monitored during session;Premedicated before session;Ice applied    Home Living Family/patient expects to be discharged to:: Private residence Living Arrangements: Spouse/significant other Available Help at Discharge: Available 24 hours/day;Family;Personal care attendant Type of Home: House Home Access: Stairs to enter Entrance Stairs-Rails: None Entrance Stairs-Number of Steps: 2 Home  Layout: One level Home Equipment: Environmental consultant - 2 wheels;Shower seat;Hand held shower head;Bedside commode Additional Comments: Dtr working on Catering manager    Prior Function Level of Independence: Independent               Journalist, newspaper        Extremity/Trunk Assessment   Upper Extremity Assessment Upper Extremity Assessment: Overall WFL for tasks assessed    Lower Extremity Assessment Lower Extremity Assessment: RLE deficits/detail RLE Deficits / Details: AAROM at hip to 90 flex and 20 abd; strength at hip 2+/5       Communication   Communication: HOH  Cognition Arousal/Alertness: Awake/alert Behavior During Therapy: WFL for tasks assessed/performed Overall Cognitive Status: Within Functional Limits for tasks assessed                                        General Comments      Exercises Total Joint Exercises Ankle Circles/Pumps: AROM;Both;20 reps;Supine Quad Sets: AROM;Both;10 reps;Supine Heel Slides: AAROM;Right;20 reps;Supine Hip ABduction/ADduction: AAROM;Right;15 reps;Supine   Assessment/Plan    PT Assessment Patient needs continued PT services  PT Problem List Decreased strength;Decreased range of motion;Decreased activity tolerance;Decreased balance;Decreased mobility;Decreased knowledge of  use of DME;Pain       PT Treatment Interventions DME instruction;Gait training;Stair training;Functional mobility training;Therapeutic activities;Therapeutic exercise;Patient/family education    PT Goals (Current goals can be found in the Care Plan section)  Acute Rehab PT Goals Patient Stated Goal: Regain IND PT Goal Formulation: With patient Time For Goal Achievement: 02/08/21 Potential to Achieve Goals: Good    Frequency 7X/week   Barriers to discharge        Co-evaluation               AM-PAC PT "6 Clicks" Mobility  Outcome Measure Help needed turning from your back to your side while in a flat bed without using bedrails?: A  Little Help needed moving from lying on your back to sitting on the side of a flat bed without using bedrails?: A Little Help needed moving to and from a bed to a chair (including a wheelchair)?: A Little Help needed standing up from a chair using your arms (e.g., wheelchair or bedside chair)?: A Little Help needed to walk in hospital room?: A Little Help needed climbing 3-5 steps with a railing? : A Lot 6 Click Score: 17    End of Session Equipment Utilized During Treatment: Gait belt Activity Tolerance: Other (comment) (anxiety, nausea) Patient left: in bed;with call bell/phone within reach;with family/visitor present Nurse Communication: Mobility status PT Visit Diagnosis: Difficulty in walking, not elsewhere classified (R26.2)    Time: 1047-1110 PT Time Calculation (min) (ACUTE ONLY): 23 min   Charges:   PT Evaluation $PT Eval Low Complexity: 1 Low          [place] Pager [phone] Office [phone]   Tennyson Kallen 02/01/2021, 1:19 PM

## 2021-02-01 NOTE — Progress Notes (Signed)
Physical Therapy Treatment Patient Details Name: [name] MRN: [id number] DOB: 1941-07-24 Today's Date: 02/01/2021    History of Present Illness Pt s/p R hip fx and now s/p R THR by anterior direct approach.  Pt with hx of PAD, peripheral neuropathy and elevated anxiety    PT Comments    Pt with continued anxiety but very cooperative and up to [facility] for toileting and ambulated limited distance in hallway.   Follow Up Recommendations  Home health PT;Follow surgeon's recommendation for DC plan and follow-up therapies     Equipment Recommendations  None recommended by PT    Recommendations for Other Services OT consult     Precautions / Restrictions Precautions Precautions: Fall Restrictions Weight Bearing Restrictions: No RLE Weight Bearing: Weight bearing as tolerated    Mobility  Bed Mobility Overal bed mobility: Needs Assistance Bed Mobility: Supine to Sit     Supine to sit: Min assist     General bed mobility comments: cues for sequence and use of L LE to self assist    Transfers Overall transfer level: Needs assistance Equipment used: Rolling walker (2 wheeled) Transfers: Sit to/from [facility] Sit to Stand: Min assist Stand pivot transfers: Min assist       General transfer comment: cues for LE management and use of UEs to self assist; stand/pvt with RW bed to [facility]  Ambulation/Gait Ambulation/Gait assistance: Min assist Gait Distance (Feet): 60 Feet Assistive device: Rolling walker (2 wheeled) Gait Pattern/deviations: Step-to pattern;Decreased step length - right;Decreased step length - left;Shuffle;Trunk flexed Gait velocity: decr   General Gait Details: cues for sequence, posture and position from [employer]             Wheelchair Mobility    Modified Rankin (Stroke Patients Only)       Balance Overall balance assessment: Needs assistance Sitting-balance support: No upper extremity supported;Feet  supported Sitting balance-Leahy Scale: Good     Standing balance support: Bilateral upper extremity supported Standing balance-Leahy Scale: Poor                              Cognition Arousal/Alertness: Awake/alert Behavior During Therapy: WFL for tasks assessed/performed Overall Cognitive Status: Within Functional Limits for tasks assessed                                        Exercises Total Joint Exercises Ankle Circles/Pumps: AROM;Both;20 reps;Supine Quad Sets: AROM;Both;10 reps;Supine Heel Slides: AAROM;Right;20 reps;Supine Hip ABduction/ADduction: AAROM;Right;15 reps;Supine    General Comments        Pertinent Vitals/Pain Pain Assessment: 0-10 Pain Score: 5  Pain Location: R hip Pain Descriptors / Indicators: Aching;Sore Pain Intervention(s): Limited activity within patient's tolerance;Monitored during session;Premedicated before session;Ice applied    Home Living Family/patient expects to be discharged to:: Private residence Living Arrangements: Spouse/significant other Available Help at Discharge: Available 24 hours/day;Family;Personal care attendant Type of Home: House Home Access: Stairs to enter Entrance Stairs-Rails: None Home Layout: One level Home Equipment: Environmental consultant - 2 wheels;Shower seat;Hand held shower head;Bedside commode Additional Comments: Dtr working on arranging 24/7    Prior Function Level of Independence: Independent          PT Goals (current goals can now be found in the care plan section) Acute Rehab PT Goals Patient Stated Goal: Regain IND PT Goal Formulation: With patient  Time For Goal Achievement: 02/08/21 Potential to Achieve Goals: Good Progress towards PT goals: Progressing toward goals    Frequency    7X/week      PT Plan Current plan remains appropriate    Co-evaluation              AM-PAC PT "6 Clicks" Mobility   Outcome Measure  Help needed turning from your back to your side  while in a flat bed without using bedrails?: A Little Help needed moving from lying on your back to sitting on the side of a flat bed without using bedrails?: A Little Help needed moving to and from a bed to a chair (including a wheelchair)?: A Little Help needed standing up from a chair using your arms (e.g., wheelchair or bedside chair)?: A Little Help needed to walk in hospital room?: A Little Help needed climbing 3-5 steps with a railing? : A Lot 6 Click Score: 17    End of Session Equipment Utilized During Treatment: Gait belt Activity Tolerance: Patient tolerated treatment well Patient left: in bed;with call bell/phone within reach;with family/visitor present Nurse Communication: Mobility status PT Visit Diagnosis: Difficulty in walking, not elsewhere classified (R26.2)     Time: 8916-9450 PT Time Calculation (min) (ACUTE ONLY): 24 min  Charges:  $Gait Training: 8-22 mins $Therapeutic Activity: 8-22 mins                     [place] Pager [phone] Office [phone]    [name] 02/01/2021, 1:27 PM

## 2021-02-02 DIAGNOSIS — Z87891 Personal history of nicotine dependence: Secondary | ICD-10-CM | POA: Diagnosis not present

## 2021-02-02 DIAGNOSIS — Z881 Allergy status to other antibiotic agents status: Secondary | ICD-10-CM | POA: Diagnosis not present

## 2021-02-02 DIAGNOSIS — R262 Difficulty in walking, not elsewhere classified: Secondary | ICD-10-CM | POA: Diagnosis not present

## 2021-02-02 DIAGNOSIS — Z8616 Personal history of COVID-19: Secondary | ICD-10-CM | POA: Diagnosis not present

## 2021-02-02 DIAGNOSIS — S72011A Unspecified intracapsular fracture of right femur, initial encounter for closed fracture: Secondary | ICD-10-CM | POA: Diagnosis not present

## 2021-02-02 DIAGNOSIS — Z85828 Personal history of other malignant neoplasm of skin: Secondary | ICD-10-CM | POA: Diagnosis not present

## 2021-02-02 DIAGNOSIS — Z888 Allergy status to other drugs, medicaments and biological substances status: Secondary | ICD-10-CM | POA: Diagnosis not present

## 2021-02-02 DIAGNOSIS — D62 Acute posthemorrhagic anemia: Secondary | ICD-10-CM | POA: Diagnosis not present

## 2021-02-02 DIAGNOSIS — Z880 Allergy status to penicillin: Secondary | ICD-10-CM | POA: Diagnosis not present

## 2021-02-02 LAB — CBC
HCT: 23 % — ABNORMAL LOW (ref 36.0–46.0)
Hemoglobin: 7.6 g/dL — ABNORMAL LOW (ref 12.0–15.0)
MCH: 31.3 pg (ref 26.0–34.0)
MCHC: 33 g/dL (ref 30.0–36.0)
MCV: 94.7 fL (ref 80.0–100.0)
Platelets: 142 10*3/uL — ABNORMAL LOW (ref 150–400)
RBC: 2.43 MIL/uL — ABNORMAL LOW (ref 3.87–5.11)
RDW: 15.2 % (ref 11.5–15.5)
WBC: 11.2 10*3/uL — ABNORMAL HIGH (ref 4.0–10.5)
nRBC: 0 % (ref 0.0–0.2)

## 2021-02-02 NOTE — Plan of Care (Signed)
  Problem: Education: Goal: Knowledge of General Education information will improve Description: Including pain rating scale, medication(s)/side effects and non-pharmacologic comfort measures Outcome: Progressing   Problem: Health Behavior/Discharge Planning: Goal: Ability to manage health-related needs will improve Outcome: Progressing   Problem: Activity: Goal: Risk for activity intolerance will decrease Outcome: Progressing   

## 2021-02-02 NOTE — Progress Notes (Signed)
Physical Therapy Treatment Patient Details Name: [name] MRN: [id number] DOB: 1941-08-06 Today's Date: 02/02/2021    History of Present Illness Pt s/p R hip fx and now s/p R THR by anterior direct approach.  Pt with hx of PAD, peripheral neuropathy and elevated anxiety    PT Comments    Pt in good spirits and progressing well with mobility.  Pt performed HEP with assist, up to bathroom for toileting and hygiene at sink, ambulated limited distance in hall, and negotiated stairs.  Pt's dtr present for session.  Follow Up Recommendations  Home health PT;Follow surgeon's recommendation for DC plan and follow-up therapies     Equipment Recommendations  None recommended by PT    Recommendations for Other Services OT consult     Precautions / Restrictions Precautions Precautions: Fall Restrictions Weight Bearing Restrictions: No RLE Weight Bearing: Weight bearing as tolerated    Mobility  Bed Mobility Overal bed mobility: Needs Assistance Bed Mobility: Supine to Sit     Supine to sit: Min guard     General bed mobility comments: cues for sequence and use of L LE to self assist    Transfers Overall transfer level: Needs assistance Equipment used: Rolling walker (2 wheeled) Transfers: Sit to/from Stand Sit to Stand: Min guard;Supervision         General transfer comment: cues for LE management and use of UEs to self assist  Ambulation/Gait Ambulation/Gait assistance: Min guard;Supervision Gait Distance (Feet): 60 Feet (and 15' into office) Assistive device: Rolling walker (2 wheeled) Gait Pattern/deviations: Step-to pattern;Decreased step length - right;Decreased step length - left;Shuffle;Trunk flexed Gait velocity: decr   General Gait Details: min cues for sequence, posture and position from RW   Stairs Stairs: Yes Stairs assistance: Min assist Stair Management: Step to pattern;Forwards;Two rails Number of Stairs: 2 General stair comments: 2 steps  using rails at top; cues for sequence   Wheelchair Mobility    Modified Rankin (Stroke Patients Only)       Balance Overall balance assessment: Needs assistance Sitting-balance support: No upper extremity supported;Feet supported Sitting balance-Leahy Scale: Good     Standing balance support: No upper extremity supported;During functional activity Standing balance-Leahy Scale: Fair                              Cognition Arousal/Alertness: Awake/alert Behavior During Therapy: WFL for tasks assessed/performed Overall Cognitive Status: Within Functional Limits for tasks assessed                                        Exercises Total Joint Exercises Ankle Circles/Pumps: AROM;Both;20 reps;Supine Quad Sets: AROM;Both;10 reps;Supine Heel Slides: AAROM;Right;20 reps;Supine Hip ABduction/ADduction: AAROM;Right;15 reps;Supine    General Comments        Pertinent Vitals/Pain Pain Assessment: 0-10 Pain Score: 4  Pain Location: R hip Pain Descriptors / Indicators: Aching;Sore Pain Intervention(s): Limited activity within patient's tolerance;Monitored during session;Premedicated before session    Home Living                      Prior Function            PT Goals (current goals can now be found in the care plan section) Acute Rehab PT Goals Patient Stated Goal: Regain IND PT Goal Formulation: With patient Time For Goal Achievement: 02/08/21 Potential to Achieve  Goals: Good Progress towards PT goals: Progressing toward goals    Frequency    7X/week      PT Plan Current plan remains appropriate    Co-evaluation              AM-PAC PT "6 Clicks" Mobility   Outcome Measure  Help needed turning from your back to your side while in a flat bed without using bedrails?: A Little Help needed moving from lying on your back to sitting on the side of a flat bed without using bedrails?: A Little Help needed moving to and from  a bed to a chair (including a wheelchair)?: A Little Help needed standing up from a chair using your arms (e.g., wheelchair or bedside chair)?: A Little Help needed to walk in hospital room?: A Little Help needed climbing 3-5 steps with a railing? : A Little 6 Click Score: 18    End of Session Equipment Utilized During Treatment: Gait belt Activity Tolerance: Other (comment) Patient left: with call bell/phone within reach;with family/visitor present;in chair Nurse Communication: Mobility status PT Visit Diagnosis: Difficulty in walking, not elsewhere classified (R26.2)     Time: 1655-3748 PT Time Calculation (min) (ACUTE ONLY): 30 min  Charges:  $Gait Training: 8-22 mins $Therapeutic Exercise: 8-22 mins                     [place] Pager [phone] Office [phone]    Kaisy Severino 02/02/2021, 3:31 PM

## 2021-02-05 ENCOUNTER — Other Ambulatory Visit: Payer: Self-pay

## 2021-02-05 NOTE — Patient Outreach (Signed)
[facility]) Care Management  [facility] Care Manager  02/05/2021   [name] November 26, 1941 [id number]  Subjective: Patient with recent hospitalization for right hip replacement after hip fracture.  Patient lives in the home with spouse who has dementia.  Daughters are currently assisting them in the home along with hired help at night per patient. She has all her medications and only using pain medication as needed.  Patient waiting for home health to call for a visit but she is up moving every hour while awake. Encouraged patient to continue to be active and complete exercises she has been taught.  She verbalized understanding.   Objective:   Encounter Medications:  Outpatient Encounter Medications as of 02/05/2021  Medication Sig Note  . acetaminophen (TYLENOL) 500 MG tablet Take 500 mg by mouth every 6 (six) hours as needed for mild pain or moderate pain.   Marland Kitchen albuterol (VENTOLIN HFA) 108 (90 Base) MCG/ACT inhaler Inhale 2 puffs into the lungs every 6 (six) hours as needed for wheezing or shortness of breath.   . ALPRAZolam (XANAX) 0.5 MG tablet Take 0.5 mg by mouth in the morning, at noon, and at bedtime. (1000, 1500, AT BEDTIME)   . amLODipine (NORVASC) 2.5 MG tablet Take 2.5 mg by mouth every evening.    Marland Kitchen aspirin 81 MG chewable tablet Chew 1 tablet (81 mg total) by mouth 2 (two) times daily.   . chlorhexidine (PERIDEX) 0.12 % solution Use as directed 15 mLs in the mouth or throat 2 (two) times daily as needed (mouth ulcers).   . Cholecalciferol (VITAMIN D3) 50 MCG (2000 UT) capsule Take 2,000 Units by mouth daily.   Marland Kitchen docusate sodium (COLACE) 100 MG capsule Take 1 capsule (100 mg total) by mouth 2 (two) times daily.   . fluocinonide gel (LIDEX) 1.01 % Apply 1 application topically 3 (three) times daily as needed (ulcers.).   Marland Kitchen fluticasone (FLONASE) 50 MCG/ACT nasal spray Place 2 sprays into both nostrils daily as needed for allergies.   Marland Kitchen HYDROcodone-acetaminophen  (NORCO/VICODIN) 5-325 MG tablet Take 1-2 tablets by mouth every 4 (four) hours as needed for moderate pain (pain score 4-6).   Marland Kitchen levothyroxine (SYNTHROID) 50 MCG tablet Take 1 tablet (50 mcg total) by mouth daily. (Patient taking differently: Take 50 mcg by mouth daily at 6 (six) AM.)   . lidocaine (XYLOCAINE) 2 % solution Use as directed 15 mLs in the mouth or throat as needed for mouth pain. (Patient taking differently: Use as directed 15 mLs in the mouth or throat daily as needed for mouth pain.)   . linaclotide (LINZESS) 72 MCG capsule Take 1 capsule (72 mcg total) by mouth daily before breakfast. (Patient taking differently: Take 145 mcg by mouth daily before breakfast.)   . losartan (COZAAR) 25 MG tablet Take 25 mg by mouth daily. IN THE MORNING.   . Multiple Vitamin (MULTIVITAMIN WITH MINERALS) TABS tablet Take 1 tablet by mouth daily in the afternoon.   . mupirocin ointment (BACTROBAN) 2 % Apply 1 application topically daily. (Patient taking differently: Apply 1 application topically daily as needed (wound care).)   . NONFORMULARY OR COMPOUNDED ITEM Apply 1 application topically 3 (three) times daily as needed (foot neuropathy). neuropathy cream dic 3%/bac 2%/gab %/lid 5%/meth 1% 12/15/2020: Compounded from [place]  . ondansetron (ZOFRAN) 4 MG tablet Take 1 tablet (4 mg total) by mouth every 6 (six) hours as needed for nausea.   . pantoprazole (PROTONIX) 40 MG tablet Take 40 mg  by mouth 2 (two) times daily.   Marland Kitchen PREVIDENT 5000 BOOSTER PLUS 1.1 % PSTE Place 1 application onto teeth at bedtime.   . raloxifene (EVISTA) 60 MG tablet Take 1 tablet (60 mg total) by mouth every morning. (Patient taking differently: Take 60 mg by mouth every other day. IN THE MORNING)   . senna (SENOKOT) 8.6 MG TABS tablet Take 1 tablet (8.6 mg total) by mouth in the morning and at bedtime.   . SYMBICORT 80-4.5 MCG/ACT inhaler INHALE 2 PUFFS INTO THE LUNGS TWICE DAILY. (Patient taking differently: Inhale 2  puffs into the lungs daily.)   . Turmeric Curcumin 500 MG CAPS Take 500 mg by mouth daily.   Marland Kitchen venlafaxine XR (EFFEXOR-XR) 37.5 MG 24 hr capsule Take 37.5 mg by mouth daily with breakfast.    . benzonatate (TESSALON) 100 MG capsule Take 1 capsule (100 mg total) by mouth 3 (three) times daily as needed for cough. (Patient not taking: No sig reported)    No facility-administered encounter medications on file as of 02/05/2021.    Functional Status:  In your present state of health, do you have any difficulty performing the following activities: 02/05/2021 01/30/2021  Hearing? Y -  Comment wears hearing aids -  Vision? N -  Difficulty concentrating or making decisions? N -  Walking or climbing stairs? Y -  Comment right hip replacement -  Dressing or bathing? N -  Doing errands, shopping? Y N  Comment family helps -  Conservation officer, nature and eating ? Y -  Comment family helps -  Using the Toilet? N -  In the past six months, have you accidently leaked urine? N -  Do you have problems with loss of bowel control? N -  Managing your Medications? N -  Managing your Finances? N -  Housekeeping or managing your Housekeeping? Y -  Comment family helps -  Some recent data might be hidden    Fall/Depression Screening: Fall Risk  02/05/2021 11/21/2020 05/03/2020  Falls in the past year? 1 0 0  Comment - - -  Number falls in past yr: 0 - -  Comment - - -  Injury with Fall? 1 - -  Comment - - -  Risk for fall due to : History of fall(s) - -  Follow up Falls prevention discussed;Education provided - -   PHQ 2/9 Scores 02/05/2021 11/21/2020 05/03/2020 04/10/2020 08/13/2019  PHQ - 2 Score 0 0 0 0 0  PHQ- 9 Score - - - 3 -    Assessment: Patient with recent hip surgery.  Patient adjusting well and utilizing walker for ambulation.   Goals Addressed            This Visit's Progress   . Make and Keep All Appointments   On track    Timeframe:  Long-Range Goal Priority:  High Start Date:  11/21/20                            Expected End Date:   08/01/21            Follow Up Date 03/01/21    - ask family or friend for a ride - keep a calendar with appointment dates    Why is this important?    Part of staying healthy is seeing the doctor for follow-up care.   If you forget your appointments, there are some things you can do to stay on track.  Notes: Patient to make follow up appointment with surgeon.    . COMPLETED: Manage My Emotions   On track    Timeframe:  Long-Range Goal Priority:  High Start Date: 11/21/20                            Expected End Date: 03/01/21                      Follow Up Date 03/01/21   - talk about feelings with a friend, family or spiritual advisor - practice positive thinking and self-talk    Why is this important?    When you are stressed, down or upset, your body reacts too.   For example, your blood pressure may get higher; you may have a headache or stomachache.   When your emotions get the best of you, your body's ability to fight off cold and flu gets weak.   These steps will help you manage your emotions.     Notes: History of anxiety    . Matintain My Quality of Life-recent hip replacement       Timeframe:  Short-Term Goal Priority:  High Start Date:     02/05/21                        Expected End Date:    03/31/21                   Follow Up Date 03/01/21   - discuss my treatment options with the doctor or nurse    Why is this important?    Having a long-term illness can be scary.   It can also be stressful for you and your caregiver.   These steps may help.    Notes: Patient ambulating around the house with walker.Keep up the great work.     . Track and Manage My Blood Pressure-Hypertension   On track    Timeframe:  Long-Range Goal Priority:  High Start Date:  11/21/20                           Expected End Date:   08/01/21 Follow Up Date 03/01/21   - check blood pressure weekly    Why is this important?    You  won't feel high blood pressure, but it can still hurt your blood vessels.   High blood pressure can cause heart or kidney problems. It can also cause a stroke.   Making lifestyle changes like losing a little weight or eating less salt will help.   Checking your blood pressure at home and at different times of the day can help to control blood pressure.   If the doctor prescribes medicine remember to take it the way the doctor ordered.   Call the office if you cannot afford the medicine or if there are questions about it.     Notes:        Plan: RN CM will send up date to physician and new welcome letter to patient.  RN CM will contact patient again this month. Follow-up:  Patient agrees to Care Plan and Follow-up.   Jone Baseman, RN, MSN [facility] Care Management Coordinator Direct Line [phone] Cell [phone] Toll Free: [phone]  Fax: [phone]

## 2021-02-05 NOTE — Patient Instructions (Signed)
Goals Addressed            This Visit's Progress   . Make and Keep All Appointments   On track    Timeframe:  Long-Range Goal Priority:  High Start Date:  11/21/20                           Expected End Date:   08/01/21            Follow Up Date 03/01/21    - ask family or friend for a ride - keep a calendar with appointment dates    Why is this important?    Part of staying healthy is seeing the doctor for follow-up care.   If you forget your appointments, there are some things you can do to stay on track.    Notes: Patient to make follow up appointment with surgeon.    . COMPLETED: Manage My Emotions   On track    Timeframe:  Long-Range Goal Priority:  High Start Date: 11/21/20                            Expected End Date: 03/01/21                      Follow Up Date 03/01/21   - talk about feelings with a friend, family or spiritual advisor - practice positive thinking and self-talk    Why is this important?    When you are stressed, down or upset, your body reacts too.   For example, your blood pressure may get higher; you may have a headache or stomachache.   When your emotions get the best of you, your body's ability to fight off cold and flu gets weak.   These steps will help you manage your emotions.     Notes: History of anxiety    . Matintain My Quality of Life-recent hip replacement       Timeframe:  Short-Term Goal Priority:  High Start Date:     02/05/21                        Expected End Date:    03/31/21                   Follow Up Date 03/01/21   - discuss my treatment options with the doctor or nurse    Why is this important?    Having a long-term illness can be scary.   It can also be stressful for you and your caregiver.   These steps may help.    Notes: Patient ambulating around the house with walker.Keep up the great work.     . Track and Manage My Blood Pressure-Hypertension   On track    Timeframe:  Long-Range Goal Priority:   High Start Date:  11/21/20                           Expected End Date:   08/01/21 Follow Up Date 03/01/21   - check blood pressure weekly    Why is this important?    You won't feel high blood pressure, but it can still hurt your blood vessels.   High blood pressure can cause heart or kidney problems. It can also cause a stroke.   Making lifestyle changes like  losing a little weight or eating less salt will help.   Checking your blood pressure at home and at different times of the day can help to control blood pressure.   If the doctor prescribes medicine remember to take it the way the doctor ordered.   Call the office if you cannot afford the medicine or if there are questions about it.     Notes:

## 2021-02-07 ENCOUNTER — Ambulatory Visit: Payer: Self-pay

## 2021-02-08 ENCOUNTER — Ambulatory Visit: Payer: Medicare HMO | Admitting: Nurse Practitioner

## 2021-02-08 DIAGNOSIS — G629 Polyneuropathy, unspecified: Secondary | ICD-10-CM | POA: Diagnosis not present

## 2021-02-08 DIAGNOSIS — K219 Gastro-esophageal reflux disease without esophagitis: Secondary | ICD-10-CM | POA: Diagnosis not present

## 2021-02-08 DIAGNOSIS — S72001D Fracture of unspecified part of neck of right femur, subsequent encounter for closed fracture with routine healing: Secondary | ICD-10-CM | POA: Diagnosis not present

## 2021-02-08 DIAGNOSIS — J45909 Unspecified asthma, uncomplicated: Secondary | ICD-10-CM | POA: Diagnosis not present

## 2021-02-08 DIAGNOSIS — I10 Essential (primary) hypertension: Secondary | ICD-10-CM | POA: Diagnosis not present

## 2021-02-08 DIAGNOSIS — M1990 Unspecified osteoarthritis, unspecified site: Secondary | ICD-10-CM | POA: Diagnosis not present

## 2021-02-08 DIAGNOSIS — E039 Hypothyroidism, unspecified: Secondary | ICD-10-CM | POA: Diagnosis not present

## 2021-02-08 DIAGNOSIS — D649 Anemia, unspecified: Secondary | ICD-10-CM | POA: Diagnosis not present

## 2021-02-08 DIAGNOSIS — I739 Peripheral vascular disease, unspecified: Secondary | ICD-10-CM | POA: Diagnosis not present

## 2021-02-09 LAB — T4, FREE: Free T4: 1.21 ng/dL (ref 0.82–1.77)

## 2021-02-09 LAB — TSH: TSH: 6.35 u[IU]/mL — ABNORMAL HIGH (ref 0.450–4.500)

## 2021-02-13 ENCOUNTER — Other Ambulatory Visit: Payer: Self-pay

## 2021-02-13 ENCOUNTER — Encounter: Payer: Self-pay | Admitting: Nurse Practitioner

## 2021-02-13 ENCOUNTER — Ambulatory Visit: Payer: Medicare HMO | Admitting: Nurse Practitioner

## 2021-02-13 VITALS — BP 146/88 | HR 70 | Ht 64.0 in | Wt 139.0 lb

## 2021-02-13 DIAGNOSIS — M1990 Unspecified osteoarthritis, unspecified site: Secondary | ICD-10-CM | POA: Diagnosis not present

## 2021-02-13 DIAGNOSIS — E039 Hypothyroidism, unspecified: Secondary | ICD-10-CM

## 2021-02-13 DIAGNOSIS — I10 Essential (primary) hypertension: Secondary | ICD-10-CM | POA: Diagnosis not present

## 2021-02-13 DIAGNOSIS — J45909 Unspecified asthma, uncomplicated: Secondary | ICD-10-CM | POA: Diagnosis not present

## 2021-02-13 DIAGNOSIS — K219 Gastro-esophageal reflux disease without esophagitis: Secondary | ICD-10-CM | POA: Diagnosis not present

## 2021-02-13 DIAGNOSIS — S72001D Fracture of unspecified part of neck of right femur, subsequent encounter for closed fracture with routine healing: Secondary | ICD-10-CM | POA: Diagnosis not present

## 2021-02-13 DIAGNOSIS — G629 Polyneuropathy, unspecified: Secondary | ICD-10-CM | POA: Diagnosis not present

## 2021-02-13 DIAGNOSIS — D649 Anemia, unspecified: Secondary | ICD-10-CM | POA: Diagnosis not present

## 2021-02-13 DIAGNOSIS — I739 Peripheral vascular disease, unspecified: Secondary | ICD-10-CM | POA: Diagnosis not present

## 2021-02-13 NOTE — Patient Instructions (Signed)

## 2021-02-13 NOTE — Progress Notes (Signed)
Endocrinology Follow Up Visit                                        02/13/2021, 11:40 AM    TELEHEALTH VISIT: The patient is being engaged in telehealth visit due to COVID-19.  This type of visit limits physical examination significantly, and thus is not preferable over face-to-face encounters.  I connected with  [name] on 02/13/21 by a video enabled telemedicine application and verified that I am speaking with the correct person using two identifiers.   I discussed the limitations of evaluation and management by telemedicine. The patient expressed understanding and agreed to proceed.    The participants involved in this visit include: Brita Romp, NP located at [facility] and [name]  located at their personal residence listed.  SUBJECTIVE:  [name] is a 80 y.o.-year-old female patient being seen in follow up after being seen in consultation for hypothyroidism referred by [name], MD.   Past Medical History:  Diagnosis Date  . Anemia   . Anxiety   . Arthritis   . Asthma   . Constipation   . GERD (gastroesophageal reflux disease)   . High blood pressure   . History of COVID-19 12/28/2020  . Hypothyroidism   . PAD (peripheral artery disease) (HCC)    bilateral legs  . Peripheral edema   . Peripheral neuropathy    bilateral feet at night occ  . Psoriasis   . Seasonal allergies   . Skin cancer    Left eye brow  . Varicose veins   . Vertigo 11/23/2013    Past Surgical History:  Procedure Laterality Date  . APPENDECTOMY     patient unsure  . BIOPSY  12/21/2020   Procedure: BIOPSY;  Surgeon: Daneil Dolin, MD;  [facility];  Service: Endoscopy;;  . CATARACT EXTRACTION W/PHACO Left 01/27/2017   Procedure: CATARACT EXTRACTION PHACO AND INTRAOCULAR LENS PLACEMENT ([place]);  Surgeon: Tonny Branch, MD;  Location: [facility];  Service:  Ophthalmology;  Laterality: Left;  CDE: 29.90  . CATARACT EXTRACTION W/PHACO Right 02/24/2017   Procedure: CATARACT EXTRACTION PHACO AND INTRAOCULAR LENS PLACEMENT ([facility]);  Surgeon: Tonny Branch, MD;  Location: [facility];  Service: Ophthalmology;  Laterality: Right;  CDE: 8.57  . CHOLECYSTECTOMY    . COLONOSCOPY N/A 10/01/2016   Procedure: COLONOSCOPY;  Surgeon: Daneil Dolin, MD; pancolonic diverticulosis, melanosis coli, otherwise normal exam.  No recommendations to repeat colonoscopy.  . COLONOSCOPY WITH PROPOFOL N/A 12/21/2020   Procedure: COLONOSCOPY WITH PROPOFOL;  Surgeon: Daneil Dolin, MD;  [facility];  Service: Endoscopy;  Laterality: N/A;  10:30am  . ESOPHAGOGASTRODUODENOSCOPY (EGD) WITH PROPOFOL N/A 12/21/2020   Procedure: ESOPHAGOGASTRODUODENOSCOPY (EGD) WITH PROPOFOL;  Surgeon: Daneil Dolin, MD;  [facility];  Service: Endoscopy;  Laterality: N/A;  . FLEXOR TENDON REPAIR Right 11/06/2017   Procedure: RIGHT WRIST FLEXOR TENDON REPAIRAND STT [place];  Surgeon: Leanora Cover, MD;  Location: [place];  Service: Orthopedics;  Laterality: Right;  . MOUTH SURGERY     artery bleed  . TOTAL HIP ARTHROPLASTY Right 01/31/2021   Procedure: TOTAL HIP ARTHROPLASTY ANTERIOR APPROACH;  Surgeon: Rod Can, MD;  Location: [facility];  Service: Orthopedics;  Laterality: Right;  . TUBAL LIGATION      Social History   Socioeconomic History  . Marital status: Married    Spouse name: Not on file  . Number of children: Not on file  . Years of education: Not on file  . Highest education level: Not on file  Occupational History  . Not on file  Tobacco Use  . Smoking status: Former Smoker    Packs/day: 0.25    Years: 5.00    Pack years: 1.25    Types: Cigarettes    Quit date: 12/02/1997    Years since quitting: 23.2  . Smokeless tobacco: Never Used  Vaping Use  . Vaping Use: Never used  Substance and Sexual Activity  . Alcohol use: Yes     Alcohol/week: 1.0 standard drink    Types: 1 Glasses of wine per week    Comment: occasional wine  . Drug use: No  . Sexual activity: Yes    Birth control/protection: Surgical    Comment: tubal  Other Topics Concern  . Not on file  Social History Narrative  . Not on file   Social Determinants of Health   Financial Resource Strain: Low Risk   . Difficulty of Paying Living Expenses: Not hard at all  Food Insecurity: No Food Insecurity  . Worried About Charity fundraiser in the Last Year: Never true  . Ran Out of Food in the Last Year: Never true  Transportation Needs: No Transportation Needs  . Lack of Transportation (Medical): No  . Lack of Transportation (Non-Medical): No  Physical Activity: Inactive  . Days of Exercise per Week: 0 days  . Minutes of Exercise per Session: 0 min  Stress: Stress Concern Present  . Feeling of Stress : To some extent  Social Connections: Socially Integrated  . Frequency of Communication with Friends and Family: More than three times a week  . Frequency of Social Gatherings with Friends and Family: More than three times a week  . Attends Religious Services: More than 4 times per year  . Active Member of Clubs or Organizations: Yes  . Attends Archivist Meetings: More than 4 times per year  . Marital Status: Married    Family History  Problem Relation Age of Onset  . Hypertension Mother   . Varicose Veins Father   . Heart attack Father   . Heart disease Father   . Hypertension Brother   . COPD Brother     Outpatient Encounter Medications as of 02/13/2021  Medication Sig  . acetaminophen (TYLENOL) 500 MG tablet Take 500 mg by mouth every 6 (six) hours as needed for mild pain or moderate pain.  Marland Kitchen albuterol (VENTOLIN HFA) 108 (90 Base) MCG/ACT inhaler Inhale 2 puffs into the lungs every 6 (six) hours as needed for wheezing or shortness of breath.  . ALPRAZolam (XANAX) 0.5 MG tablet Take 0.5 mg by mouth in the morning, at noon, and  at bedtime. (1000, 1500, AT BEDTIME)  . amLODipine (NORVASC) 2.5 MG tablet Take 2.5 mg by mouth every evening.   Marland Kitchen aspirin 81 MG chewable tablet Chew 1 tablet (81 mg total) by mouth 2 (two) times daily.  . benzonatate (TESSALON) 100 MG capsule Take 1 capsule (100 mg total)  by mouth 3 (three) times daily as needed for cough.  . chlorhexidine (PERIDEX) 0.12 % solution Use as directed 15 mLs in the mouth or throat 2 (two) times daily as needed (mouth ulcers).  . Cholecalciferol (VITAMIN D3) 50 MCG (2000 UT) capsule Take 2,000 Units by mouth daily.  Marland Kitchen docusate sodium (COLACE) 100 MG capsule Take 1 capsule (100 mg total) by mouth 2 (two) times daily.  . fluocinonide gel (LIDEX) 9.62 % Apply 1 application topically 3 (three) times daily as needed (ulcers.).  Marland Kitchen fluticasone (FLONASE) 50 MCG/ACT nasal spray Place 2 sprays into both nostrils daily as needed for allergies.  Marland Kitchen HYDROcodone-acetaminophen (NORCO/VICODIN) 5-325 MG tablet Take 1-2 tablets by mouth every 4 (four) hours as needed for moderate pain (pain score 4-6).  Marland Kitchen levothyroxine (SYNTHROID) 50 MCG tablet Take 1 tablet (50 mcg total) by mouth daily. (Patient taking differently: Take 50 mcg by mouth daily at 6 (six) AM.)  . lidocaine (XYLOCAINE) 2 % solution Use as directed 15 mLs in the mouth or throat as needed for mouth pain. (Patient taking differently: Use as directed 15 mLs in the mouth or throat daily as needed for mouth pain.)  . LINZESS 145 MCG CAPS capsule TAKE 1 CAPSULE BEFOREOBREAKFAST  . losartan (COZAAR) 25 MG tablet Take 25 mg by mouth daily. IN THE MORNING.  . Multiple Vitamin (MULTIVITAMIN WITH MINERALS) TABS tablet Take 1 tablet by mouth daily in the afternoon.  . mupirocin ointment (BACTROBAN) 2 % Apply 1 application topically daily. (Patient taking differently: Apply 1 application topically daily as needed (wound care).)  . NONFORMULARY OR COMPOUNDED ITEM Apply 1 application topically 3 (three) times daily as needed (foot  neuropathy). neuropathy cream dic 3%/bac 2%/gab %/lid 5%/meth 1%  . ondansetron (ZOFRAN) 4 MG tablet Take 1 tablet (4 mg total) by mouth every 6 (six) hours as needed for nausea.  . pantoprazole (PROTONIX) 40 MG tablet Take 40 mg by mouth 2 (two) times daily.  Marland Kitchen PREVIDENT 5000 BOOSTER PLUS 1.1 % PSTE Place 1 application onto teeth at bedtime.  . raloxifene (EVISTA) 60 MG tablet Take 1 tablet (60 mg total) by mouth every morning. (Patient taking differently: Take 60 mg by mouth every other day. IN THE MORNING)  . senna (SENOKOT) 8.6 MG TABS tablet Take 1 tablet (8.6 mg total) by mouth in the morning and at bedtime.  . SYMBICORT 80-4.5 MCG/ACT inhaler INHALE 2 PUFFS INTO THE LUNGS TWICE DAILY. (Patient taking differently: Inhale 2 puffs into the lungs daily.)  . Turmeric Curcumin 500 MG CAPS Take 500 mg by mouth daily.  Marland Kitchen venlafaxine XR (EFFEXOR-XR) 37.5 MG 24 hr capsule Take 37.5 mg by mouth daily with breakfast.   . [DISCONTINUED] linaclotide (LINZESS) 72 MCG capsule Take 1 capsule (72 mcg total) by mouth daily before breakfast. (Patient taking differently: Take 145 mcg by mouth daily before breakfast.)   No facility-administered encounter medications on file as of 02/13/2021.    ALLERGIES: Allergies  Allergen Reactions  . Amoxicillin-Pot Clavulanate Nausea And Vomiting, Other (See Comments), Itching and Nausea Only    Has patient had a PCN reaction causing immediate rash, facial/tongue/throat swelling, SOB or lightheadedness with hypotension: No Has patient had a PCN reaction causing severe rash involving mucus membranes or skin necrosis: No Has patient had a PCN reaction that required hospitalization: No Has patient had a PCN reaction occurring within the last 10 years: No If all of the above answers are "NO", then may proceed with Cephalosporin use. Has patient  had a PCN reaction causing immediate rash, facial/tongue/throat swelling, SOB or lightheadedness with hypotension: No Has patient  had a PCN reaction causing severe rash involving mucus membranes or skin necrosis: No Has patient had a PCN reaction that required hospitalization: No Has patient had a PCN reaction occurring within the last 10 years: No If all of the above answers are "NO", then may proceed with Cephalosporin use.   . Ace Inhibitors Cough  . Entex La Itching  . Levofloxacin Hives and Itching  . Meloxicam Other (See Comments)    (MOBIC) Unknown reaction type (maybe nausea?)   VACCINATION STATUS: Immunization History  Administered Date(s) Administered  . Influenza Whole 09/01/2016  . Influenza, High Dose Seasonal PF 09/11/2018  . Influenza-Unspecified 08/02/2013, 08/29/2015  . PFIZER(Purple Top)SARS-COV-2 Vaccination 12/16/2019, 01/17/2020, 09/28/2020     HPI  Thyroid Problem Presents for follow-up (diagnosed with hypothyroidism at approximate age of 60 years by her PCP.  family history of thyroid disorders in her daughter (hypothyroidism with nodules).  No family history of thyroid cancer.) visit. Symptoms include anxiety, fatigue, palpitations and tremors. Patient reports no cold intolerance, constipation, depressed mood, diarrhea, heat intolerance, leg swelling, weight gain or weight loss. The symptoms have been improving.    [name]  is a patient with the above medical history.  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    I reviewed patient's thyroid tests:  Lab Results  Component Value Date   TSH 6.350 (H) 02/08/2021   TSH 2.39 09/28/2020   TSH 0.60 07/25/2020   FREET4 1.21 02/08/2021   FREET4 1.4 09/28/2020     Pt describes: - weight loss - fatigue/anxiety - palpitations - tremors - insomnia which has resolved since lowering her Levothyroxine dose  Pt denies feeling nodules in neck, hoarseness, dysphagia/odynophagia, SOB with lying down.  she has No history of radiation therapy to head or neck.  No recent use of iodine supplements.  I  reviewed her chart and she also has a history of GERD with ulcers, depression/anxiety, anemia, asthma, and high blood pressure.   Review of systems  Constitutional: + Minimally fluctuating body weight,  current Body mass index is 23.86 kg/m. , no fatigue, no subjective hyperthermia, no subjective hypothermia Eyes: no blurry vision, no xerophthalmia ENT: no sore throat, no nodules palpated in throat, no dysphagia/odynophagia, no hoarseness Cardiovascular: no chest pain, no shortness of breath, no palpitations, no leg swelling Respiratory: no cough, no shortness of breath Gastrointestinal: no nausea/vomiting/diarrhea Musculoskeletal: + right hip pain (recent hip replacement) Skin: no rashes, no hyperemia Neurological: no tremors, no numbness, no tingling, no dizziness Psychiatric: no depression, + anxiety (controlled on meds)   OBJECTIVE:  BP (!) 146/88 (BP Location: Left Arm, Patient Position: Sitting)   Pulse 70   Ht 5\' 4"  (1.626 m)   Wt 139 lb (63 kg)   BMI 23.86 kg/m  Wt Readings from Last 3 Encounters:  02/13/21 139 lb (63 kg)  01/31/21 134 lb (60.8 kg)  01/04/21 135 lb (61.2 kg)   BP Readings from Last 3 Encounters:  02/13/21 (!) 146/88  02/02/21 133/85  01/04/21 137/83     Physical Exam- Limited  Constitutional:  Body mass index is 23.86 kg/m. , not in acute distress, normal state of mind Eyes:  EOMI, no exophthalmos Neck: Supple Cardiovascular: RRR, no murmers, rubs, or gallops, no edema Respiratory: Adequate breathing efforts, no crackles, rales, rhonchi, or wheezing Musculoskeletal: no gross deformities, strength intact in all four  extremities, no gross restriction of joint movements Skin:  no rashes, no hyperemia Neurological: no tremor with outstretched hands   CMP ( most recent) CMP     Component Value Date/Time   NA 137 02/01/2021 0319   NA 141 09/24/2018 1204   K 3.6 02/01/2021 0319   CL 106 02/01/2021 0319   CO2 23 02/01/2021 0319   GLUCOSE  189 (H) 02/01/2021 0319   BUN 16 02/01/2021 0319   BUN 13 09/24/2018 1204   CREATININE 0.78 02/01/2021 0319   CREATININE 0.94 (H) 01/22/2021 1419   CALCIUM 7.8 (L) 02/01/2021 0319   PROT 6.8 01/31/2021 0928   PROT 6.5 09/24/2018 1204   ALBUMIN 4.1 01/31/2021 0928   ALBUMIN 4.3 09/24/2018 1204   AST 21 01/31/2021 0928   ALT 16 01/31/2021 0928   ALKPHOS 64 01/31/2021 0928   BILITOT 0.9 01/31/2021 0928   BILITOT 0.4 09/24/2018 1204   GFRNONAA >60 02/01/2021 0319   GFRAA >60 05/08/2020 1232     Diabetic Labs (most recent): No results found for: HGBA1C   Lipid Panel ( most recent) Lipid Panel     Component Value Date/Time   TRIG 92 09/11/2020 1547       Lab Results  Component Value Date   TSH 6.350 (H) 02/08/2021   TSH 2.39 09/28/2020   TSH 0.60 07/25/2020   FREET4 1.21 02/08/2021   FREET4 1.4 09/28/2020       ASSESSMENT / PLAN: 1. Hypothyroidism- unspecified etiology   Patient with long-standing hypothyroidism, on levothyroxine therapy. On physical exam, patient  does not have gross goiter, thyroid nodules, or neck compression symptoms.  Thyroid US WNL for age.  -Her previsit thyroid function tests are consistent with slight under-replacement.  However, she did go a few days where she could not take her medication appropriately when she was in the hospital for her hip replacement.  She is advised to continue on current dose of Levothyroxine at 50 mcg po daily before breakfast.   - We discussed about correct intake of levothyroxine, at fasting, with water, separated by at least 30 minutes from breakfast. -Patient is made aware of the fact that thyroid hormone replacement is needed for life, dose to be adjusted by periodic monitoring of thyroid function tests.        - Time spent on this patient care encounter:  30 minutes of which 50% was spent in  counseling and the rest reviewing  her current and  previous labs / studies and medications  doses and developing a  plan for long term care, and documenting this care. [name]  participated in the discussions, expressed understanding, and voiced agreement with the above plans.  All questions were answered to her satisfaction. she is encouraged to contact clinic should she have any questions or concerns prior to her return visit.   Follow Up Plan: Return in about 3 months (around 05/16/2021) for Thyroid follow up, Previsit labs.    Rayetta Pigg, [facility] [address] [place], [place] [address] Phone: [phone] Fax: [phone]  02/13/2021, 11:40 AM

## 2021-02-14 ENCOUNTER — Telehealth: Payer: Self-pay

## 2021-02-14 NOTE — Telephone Encounter (Signed)
2 hours is fine.

## 2021-02-14 NOTE — Telephone Encounter (Signed)
Whitney, This patient is asking if she should be waiting 4-6 hours to take her Protonix after taking her synthroid medication. She seen on the patient portal this information. She said you advised her that 2 hours after synthroid would be fine but she is thinking she should not be. Please advise.   Please leave a VM if she does not answer. Thank you.

## 2021-02-14 NOTE — Telephone Encounter (Signed)
Returned call to patient and advised, verbalized understanding 

## 2021-02-15 DIAGNOSIS — E039 Hypothyroidism, unspecified: Secondary | ICD-10-CM | POA: Diagnosis not present

## 2021-02-15 DIAGNOSIS — D649 Anemia, unspecified: Secondary | ICD-10-CM | POA: Diagnosis not present

## 2021-02-15 DIAGNOSIS — M1990 Unspecified osteoarthritis, unspecified site: Secondary | ICD-10-CM | POA: Diagnosis not present

## 2021-02-15 DIAGNOSIS — I739 Peripheral vascular disease, unspecified: Secondary | ICD-10-CM | POA: Diagnosis not present

## 2021-02-15 DIAGNOSIS — I10 Essential (primary) hypertension: Secondary | ICD-10-CM | POA: Diagnosis not present

## 2021-02-15 DIAGNOSIS — S72001D Fracture of unspecified part of neck of right femur, subsequent encounter for closed fracture with routine healing: Secondary | ICD-10-CM | POA: Diagnosis not present

## 2021-02-15 DIAGNOSIS — K219 Gastro-esophageal reflux disease without esophagitis: Secondary | ICD-10-CM | POA: Diagnosis not present

## 2021-02-15 DIAGNOSIS — J45909 Unspecified asthma, uncomplicated: Secondary | ICD-10-CM | POA: Diagnosis not present

## 2021-02-15 DIAGNOSIS — G629 Polyneuropathy, unspecified: Secondary | ICD-10-CM | POA: Diagnosis not present

## 2021-02-16 ENCOUNTER — Telehealth: Payer: Self-pay | Admitting: Internal Medicine

## 2021-02-16 NOTE — Telephone Encounter (Signed)
That is ok. I suspect worsening constipation is likely related to surgery, decreased mobility, and pain medications. She can actually try taking 2 Linzess together in the morning and hold the stool softener and MiraLAX if she would like. I suspect this acute worsening is likely temporary, and hopefully we can go back to Linzess 145 mcg daily. However, if she continues to feel she needs a higher dose, we can send in a Rx for Linzess 290 mcg.

## 2021-02-16 NOTE — Telephone Encounter (Signed)
Pt asking to speak with nurse. [phone]

## 2021-02-16 NOTE — Telephone Encounter (Signed)
Pt had surgery 2 weeks ago. Pt is taking Linzess 145 mcg 30 mins before her breakfast. Pt also takes a stool softener twice daily. Pt has been off of her pain medication for 1 week. Pt would like to know if it's ok to take Linzess 145 mcg once daily, stool softener bid and Miralax as needed. After taking all three, pt had a BM that was somewhat loose at first and then was formed. Please advise.

## 2021-02-19 DIAGNOSIS — S72024A Nondisplaced fracture of epiphysis (separation) (upper) of right femur, initial encounter for closed fracture: Secondary | ICD-10-CM | POA: Diagnosis not present

## 2021-02-19 NOTE — Telephone Encounter (Signed)
Pt returned call. Pt was notified of Aliene Altes, [place] recommendations of trying 2 Linzess 145 mcg pills to see if it will help with pts constipation. Pt will hold the stool softener and Miralax. Pt is hoping that when she heals from her procedure, she will go back to Liness 145 mcg daily.

## 2021-02-19 NOTE — Telephone Encounter (Signed)
Spoke with pts daughter and she will have pt call back.

## 2021-02-20 DIAGNOSIS — I10 Essential (primary) hypertension: Secondary | ICD-10-CM | POA: Diagnosis not present

## 2021-02-20 DIAGNOSIS — D649 Anemia, unspecified: Secondary | ICD-10-CM | POA: Diagnosis not present

## 2021-02-20 DIAGNOSIS — K219 Gastro-esophageal reflux disease without esophagitis: Secondary | ICD-10-CM | POA: Diagnosis not present

## 2021-02-20 DIAGNOSIS — G629 Polyneuropathy, unspecified: Secondary | ICD-10-CM | POA: Diagnosis not present

## 2021-02-20 DIAGNOSIS — S72001D Fracture of unspecified part of neck of right femur, subsequent encounter for closed fracture with routine healing: Secondary | ICD-10-CM | POA: Diagnosis not present

## 2021-02-20 DIAGNOSIS — J45909 Unspecified asthma, uncomplicated: Secondary | ICD-10-CM | POA: Diagnosis not present

## 2021-02-20 DIAGNOSIS — E039 Hypothyroidism, unspecified: Secondary | ICD-10-CM | POA: Diagnosis not present

## 2021-02-20 DIAGNOSIS — M1990 Unspecified osteoarthritis, unspecified site: Secondary | ICD-10-CM | POA: Diagnosis not present

## 2021-02-20 DIAGNOSIS — I739 Peripheral vascular disease, unspecified: Secondary | ICD-10-CM | POA: Diagnosis not present

## 2021-02-21 ENCOUNTER — Other Ambulatory Visit: Payer: Self-pay

## 2021-02-21 NOTE — Patient Outreach (Signed)
[facility]) Care Management  [place]  02/21/2021   [name] 1941-06-25 [id number]  Subjective: Telephone call to patient. She reports she is doing ok. She states her therapist states she is doing good and possible last visit on this week and possible outpatient therapy. Patient still has caregiver almost around the clock due to her and her husband needing help.  She reports she gets down due to being slowed down.  Encouraged patient to continue with therapy in order to regain independence. She verbalized understanding.    Objective:   Encounter Medications:  Outpatient Encounter Medications as of 02/21/2021  Medication Sig Note  . acetaminophen (TYLENOL) 500 MG tablet Take 500 mg by mouth every 6 (six) hours as needed for mild pain or moderate pain.   Marland Kitchen albuterol (VENTOLIN HFA) 108 (90 Base) MCG/ACT inhaler Inhale 2 puffs into the lungs every 6 (six) hours as needed for wheezing or shortness of breath.   . ALPRAZolam (XANAX) 0.5 MG tablet Take 0.5 mg by mouth in the morning, at noon, and at bedtime. (1000, 1500, AT BEDTIME)   . amLODipine (NORVASC) 2.5 MG tablet Take 2.5 mg by mouth every evening.    Marland Kitchen aspirin 81 MG chewable tablet Chew 1 tablet (81 mg total) by mouth 2 (two) times daily.   . benzonatate (TESSALON) 100 MG capsule Take 1 capsule (100 mg total) by mouth 3 (three) times daily as needed for cough.   . chlorhexidine (PERIDEX) 0.12 % solution Use as directed 15 mLs in the mouth or throat 2 (two) times daily as needed (mouth ulcers).   . Cholecalciferol (VITAMIN D3) 50 MCG (2000 UT) capsule Take 2,000 Units by mouth daily.   Marland Kitchen docusate sodium (COLACE) 100 MG capsule Take 1 capsule (100 mg total) by mouth 2 (two) times daily.   . fluocinonide gel (LIDEX) 7.20 % Apply 1 application topically 3 (three) times daily as needed (ulcers.).   Marland Kitchen fluticasone (FLONASE) 50 MCG/ACT nasal spray Place 2 sprays into both nostrils daily as needed for allergies.    Marland Kitchen HYDROcodone-acetaminophen (NORCO/VICODIN) 5-325 MG tablet Take 1-2 tablets by mouth every 4 (four) hours as needed for moderate pain (pain score 4-6).   Marland Kitchen levothyroxine (SYNTHROID) 50 MCG tablet Take 1 tablet (50 mcg total) by mouth daily. (Patient taking differently: Take 50 mcg by mouth daily at 6 (six) AM.)   . lidocaine (XYLOCAINE) 2 % solution Use as directed 15 mLs in the mouth or throat as needed for mouth pain. (Patient taking differently: Use as directed 15 mLs in the mouth or throat daily as needed for mouth pain.)   . LINZESS 145 MCG CAPS capsule TAKE 1 CAPSULE BEFOREOBREAKFAST   . losartan (COZAAR) 25 MG tablet Take 25 mg by mouth daily. IN THE MORNING.   . Multiple Vitamin (MULTIVITAMIN WITH MINERALS) TABS tablet Take 1 tablet by mouth daily in the afternoon.   . mupirocin ointment (BACTROBAN) 2 % Apply 1 application topically daily. (Patient taking differently: Apply 1 application topically daily as needed (wound care).)   . NONFORMULARY OR COMPOUNDED ITEM Apply 1 application topically 3 (three) times daily as needed (foot neuropathy). neuropathy cream dic 3%/bac 2%/gab %/lid 5%/meth 1% 12/15/2020: Compounded from [place]  . ondansetron (ZOFRAN) 4 MG tablet Take 1 tablet (4 mg total) by mouth every 6 (six) hours as needed for nausea.   . pantoprazole (PROTONIX) 40 MG tablet Take 40 mg by mouth 2 (two) times daily.   Marland Kitchen [place]  5000 BOOSTER PLUS 1.1 % PSTE Place 1 application onto teeth at bedtime.   . raloxifene (EVISTA) 60 MG tablet Take 1 tablet (60 mg total) by mouth every morning. (Patient taking differently: Take 60 mg by mouth every other day. IN THE MORNING)   . senna (SENOKOT) 8.6 MG TABS tablet Take 1 tablet (8.6 mg total) by mouth in the morning and at bedtime.   . SYMBICORT 80-4.5 MCG/ACT inhaler INHALE 2 PUFFS INTO THE LUNGS TWICE DAILY. (Patient taking differently: Inhale 2 puffs into the lungs daily.)   . Turmeric Curcumin 500 MG CAPS Take 500 mg by mouth  daily.   Marland Kitchen venlafaxine XR (EFFEXOR-XR) 37.5 MG 24 hr capsule Take 37.5 mg by mouth daily with breakfast.     No facility-administered encounter medications on file as of 02/21/2021.    Functional Status:  In your present state of health, do you have any difficulty performing the following activities: 02/05/2021 01/30/2021  Hearing? Y -  Comment wears hearing aids -  Vision? N -  Difficulty concentrating or making decisions? N -  Walking or climbing stairs? Y -  Comment right hip replacement -  Dressing or bathing? N -  Doing errands, shopping? Y N  Comment family helps -  Conservation officer, nature and eating ? Y -  Comment family helps -  Using the Toilet? N -  In the past six months, have you accidently leaked urine? N -  Do you have problems with loss of bowel control? N -  Managing your Medications? N -  Managing your Finances? N -  Housekeeping or managing your Housekeeping? Y -  Comment family helps -  Some recent data might be hidden    Fall/Depression Screening: Fall Risk  02/05/2021 11/21/2020 05/03/2020  Falls in the past year? 1 0 0  Comment - - -  Number falls in past yr: 0 - -  Comment - - -  Injury with Fall? 1 - -  Comment - - -  Risk for fall due to : History of fall(s) - -  Follow up Falls prevention discussed;Education provided - -   PHQ 2/9 Scores 02/05/2021 11/21/2020 05/03/2020 04/10/2020 08/13/2019  PHQ - 2 Score 0 0 0 0 0  PHQ- 9 Score - - - 3 -    Assessment:  Goals Addressed            This Visit's Progress   . Make and Keep All Appointments   On track    Timeframe:  Long-Range Goal Priority:  High Start Date:  11/21/20                           Expected End Date:   08/01/21            Follow Up Date 03/31/21    - call to cancel if needed    Why is this important?    Part of staying healthy is seeing the doctor for follow-up care.   If you forget your appointments, there are some things you can do to stay on track.    Notes: Patient to make follow up  appointment with surgeon.  02/21/21 Patient saw surgeon on 02/19/21. Visit went well.      . [place] My Quality of Life-recent hip replacement   On track    Timeframe:  Short-Term Goal Priority:  High Start Date:     02/05/21  Expected End Date:    03/31/21                   Follow Up Date 03/31/21   - discuss my treatment options with the doctor or nurse - do one enjoyable thing every day    Why is this important?    Having a long-term illness can be scary.   It can also be stressful for you and your caregiver.   These steps may help.    Notes: Patient ambulating around the house with walker.Keep up the great work.  02/21/21 Patient working with PT.      . Track and Manage My Blood Pressure-Hypertension   On track    Timeframe:  Long-Range Goal Priority:  High Start Date:  11/21/20                           Expected End Date:   08/01/21 Follow Up Date 03/31/21   - check blood pressure weekly - write blood pressure results in a log or diary    Why is this important?    You won't feel high blood pressure, but it can still hurt your blood vessels.   High blood pressure can cause heart or kidney problems. It can also cause a stroke.   Making lifestyle changes like losing a little weight or eating less salt will help.   Checking your blood pressure at home and at different times of the day can help to control blood pressure.   If the doctor prescribes medicine remember to take it the way the doctor ordered.   Call the office if you cannot afford the medicine or if there are questions about it.     Notes: 02/21/21 Patient blood pressure less than 140/80.       Plan: RN CM will outreach patient again in April.   Follow-up:  Patient agrees to Care Plan and Follow-up.   Jone Baseman, RN, MSN [facility] Care Management Coordinator Direct Line [phone] Cell [phone] Toll Free: [phone]  Fax: [phone]

## 2021-02-22 DIAGNOSIS — E039 Hypothyroidism, unspecified: Secondary | ICD-10-CM | POA: Diagnosis not present

## 2021-02-22 DIAGNOSIS — G629 Polyneuropathy, unspecified: Secondary | ICD-10-CM | POA: Diagnosis not present

## 2021-02-22 DIAGNOSIS — I10 Essential (primary) hypertension: Secondary | ICD-10-CM | POA: Diagnosis not present

## 2021-02-22 DIAGNOSIS — M1990 Unspecified osteoarthritis, unspecified site: Secondary | ICD-10-CM | POA: Diagnosis not present

## 2021-02-22 DIAGNOSIS — J45909 Unspecified asthma, uncomplicated: Secondary | ICD-10-CM | POA: Diagnosis not present

## 2021-02-22 DIAGNOSIS — K219 Gastro-esophageal reflux disease without esophagitis: Secondary | ICD-10-CM | POA: Diagnosis not present

## 2021-02-22 DIAGNOSIS — S72001D Fracture of unspecified part of neck of right femur, subsequent encounter for closed fracture with routine healing: Secondary | ICD-10-CM | POA: Diagnosis not present

## 2021-02-22 DIAGNOSIS — D649 Anemia, unspecified: Secondary | ICD-10-CM | POA: Diagnosis not present

## 2021-02-22 DIAGNOSIS — I739 Peripheral vascular disease, unspecified: Secondary | ICD-10-CM | POA: Diagnosis not present

## 2021-02-27 ENCOUNTER — Encounter: Payer: Self-pay | Admitting: Podiatry

## 2021-02-27 ENCOUNTER — Ambulatory Visit (INDEPENDENT_AMBULATORY_CARE_PROVIDER_SITE_OTHER): Payer: Medicare HMO | Admitting: Podiatry

## 2021-02-27 ENCOUNTER — Other Ambulatory Visit: Payer: Self-pay

## 2021-02-27 DIAGNOSIS — M21619 Bunion of unspecified foot: Secondary | ICD-10-CM

## 2021-02-27 DIAGNOSIS — L84 Corns and callosities: Secondary | ICD-10-CM | POA: Diagnosis not present

## 2021-03-01 NOTE — Progress Notes (Signed)
Subjective: 80 year old female presents the office today for follow-up evaluation of calluses on her right foot.  She states that she is doing well she feels that the areas have healed.  She denies any open sores any redness or swelling or any drainage.  She is asking if that she can be released. Denies any systemic complaints such as fevers, chills, nausea, vomiting. No acute changes since last appointment, and no other complaints at this time.   Objective: AAO x3, NAD DP/PT pulses palpable bilaterally, CRT less than 3 seconds There is minimal hyperkeratotic tissue present medial hallux IPJ and MPJ.  There is no ulcerations identified there is no edema, erythema or signs of infection.  Bunion is present.  There is no fluctuation or crepitation.  There is no malodor. No pain with calf compression, swelling, warmth, erythema  Assessment: Preulcerative callus right foot-primary  Plan: -All treatment options discussed with the patient including all alternatives, risks, complications.  -Calluses was minimal today.  I did lightly debride some of the tissue with any complications or bleeding.  I want her to continue with moisturizer and offloading.  Continue with supportive/offloading shoes.  If there is any skin breakdown would recommend going back to mupirocin ointment at this point she appears to be stable without any signs of infection and there is minimal hyperkeratotic tissue.  Follow-up as needed.  Trula Slade DPM

## 2021-03-05 ENCOUNTER — Telehealth: Payer: Self-pay | Admitting: Internal Medicine

## 2021-03-05 DIAGNOSIS — L03115 Cellulitis of right lower limb: Secondary | ICD-10-CM | POA: Diagnosis not present

## 2021-03-05 DIAGNOSIS — G501 Atypical facial pain: Secondary | ICD-10-CM | POA: Diagnosis not present

## 2021-03-05 DIAGNOSIS — M79674 Pain in right toe(s): Secondary | ICD-10-CM | POA: Diagnosis not present

## 2021-03-05 DIAGNOSIS — R21 Rash and other nonspecific skin eruption: Secondary | ICD-10-CM | POA: Diagnosis not present

## 2021-03-05 DIAGNOSIS — L209 Atopic dermatitis, unspecified: Secondary | ICD-10-CM | POA: Diagnosis not present

## 2021-03-05 DIAGNOSIS — Z0001 Encounter for general adult medical examination with abnormal findings: Secondary | ICD-10-CM | POA: Diagnosis not present

## 2021-03-05 DIAGNOSIS — Z0000 Encounter for general adult medical examination without abnormal findings: Secondary | ICD-10-CM | POA: Diagnosis not present

## 2021-03-05 DIAGNOSIS — R197 Diarrhea, unspecified: Secondary | ICD-10-CM | POA: Diagnosis not present

## 2021-03-05 DIAGNOSIS — R110 Nausea: Secondary | ICD-10-CM | POA: Diagnosis not present

## 2021-03-05 DIAGNOSIS — E782 Mixed hyperlipidemia: Secondary | ICD-10-CM | POA: Diagnosis not present

## 2021-03-05 NOTE — Telephone Encounter (Signed)
Spoke with pt. Pt states she wasn't expecting a call back but keeps having the rt sided pain near her navel. Pt states pain is a dull ache and can sometimes get sharp. Pain is about a level 5 with 10 being the highest. Pain is most felt when pt is moving around. Pt had a BM yesterday and didn't have one yet this morning. Pt took 2 of the Linzess 145 mcg tabs this morning and may add a stool softener this evening. Pt ate a sausage biscuit for breakfast, chicken salad sandwich with peaches for lunch. Pt advised to drink fluids and make sure she eats veggies and fruit daily. Pt Pt is scheduled for the end of April and wants a call if we get a cancellation.

## 2021-03-05 NOTE — Telephone Encounter (Signed)
Pt is aware of her OV with [facility] on 03/20/2021. She is now having right sided pain and was asking to be seen today. I told her our schedule is in late May and I have nothing available sooner than what she is scheduled for. She would like for the nurse to call her at [phone]. She is also aware that she's on a cancellation list.

## 2021-03-06 NOTE — Telephone Encounter (Signed)
Spoke with patient.  She had resolution of right-sided abdominal pain after hip surgery initially.  However, about 2 weeks ago, she stopped using her walker and has noticed return of right-sided abdominal pain.  Same location as prior to hip surgery.  Some days are worse than others.  Can get up to 5-6 out of 10 in severity. Prior CT in February for the same symptoms did not reveal any acute abdominal etiologies; only with impaction of the right subcapital femoral neck region with sclerosis. Patient reports pain is only associated with movement.  No association with meals or bowel movements.  She does continue with constipation.  She has been taking Linzess 145 mcg daily.  The last 2 days, she did take 2 Linzess 145 mcg capsules and had a bowel movement this morning.  She was advised to continue taking 2 Linzess 145 mcg capsules every morning and call back on Monday with a progress report on constipation.  I will send in a prescription for 290 mcg if this is working well. I also encouraged her to reach out to her surgeon to let them know she has had return of abdominal pain in the same location that she had prior to her hip surgery, and this seemed to correlate closely to when she stopped using her walker.

## 2021-03-12 ENCOUNTER — Telehealth: Payer: Self-pay | Admitting: Internal Medicine

## 2021-03-12 DIAGNOSIS — F411 Generalized anxiety disorder: Secondary | ICD-10-CM | POA: Diagnosis not present

## 2021-03-12 DIAGNOSIS — E039 Hypothyroidism, unspecified: Secondary | ICD-10-CM | POA: Diagnosis not present

## 2021-03-12 DIAGNOSIS — S72024D Nondisplaced fracture of epiphysis (separation) (upper) of right femur, subsequent encounter for closed fracture with routine healing: Secondary | ICD-10-CM | POA: Diagnosis not present

## 2021-03-12 DIAGNOSIS — E782 Mixed hyperlipidemia: Secondary | ICD-10-CM | POA: Diagnosis not present

## 2021-03-12 DIAGNOSIS — K219 Gastro-esophageal reflux disease without esophagitis: Secondary | ICD-10-CM | POA: Diagnosis not present

## 2021-03-12 DIAGNOSIS — E1169 Type 2 diabetes mellitus with other specified complication: Secondary | ICD-10-CM | POA: Diagnosis not present

## 2021-03-12 DIAGNOSIS — S72024A Nondisplaced fracture of epiphysis (separation) (upper) of right femur, initial encounter for closed fracture: Secondary | ICD-10-CM | POA: Diagnosis not present

## 2021-03-12 DIAGNOSIS — J45909 Unspecified asthma, uncomplicated: Secondary | ICD-10-CM | POA: Diagnosis not present

## 2021-03-12 DIAGNOSIS — M8580 Other specified disorders of bone density and structure, unspecified site: Secondary | ICD-10-CM | POA: Diagnosis not present

## 2021-03-12 DIAGNOSIS — F331 Major depressive disorder, recurrent, moderate: Secondary | ICD-10-CM | POA: Diagnosis not present

## 2021-03-12 DIAGNOSIS — I10 Essential (primary) hypertension: Secondary | ICD-10-CM | POA: Diagnosis not present

## 2021-03-12 NOTE — Telephone Encounter (Signed)
Called and spoke with a family member. Pt is at the doctors office at this time. I will call back.

## 2021-03-12 NOTE — Telephone Encounter (Signed)
[phone]  PATIENT WAS SUPPOSED TO CALL WITH AN UPDATE ON HOW THE LINZESS WAS WORKING SINCE SHE INCREASED TO 2 A DAY   PLEASE CALL HER

## 2021-03-12 NOTE — Telephone Encounter (Signed)
Pt called with a progress report. Pt went to see her orthopedic doctor today and was told pts symptoms aren't coming from her surgery per pt. Pt is asking if we can please help her because the abdominal pain now gets up to a 7. Pt had a BM on April 7, 8, 9, 10 and 11 when taking two Linzess 145 mcg in the morning 30 mins before breakfast. Pt states that on April 9, 20222, her BM was very productive and feels like it did before she needed to take medication to have a BM. On the other days listed, pt felt the urgency to have a BM, but had to strain to get it out.

## 2021-03-13 ENCOUNTER — Telehealth: Payer: Self-pay | Admitting: Internal Medicine

## 2021-03-13 ENCOUNTER — Other Ambulatory Visit: Payer: Self-pay | Admitting: Gastroenterology

## 2021-03-13 DIAGNOSIS — K5900 Constipation, unspecified: Secondary | ICD-10-CM

## 2021-03-13 MED ORDER — TRULANCE 3 MG PO TABS: 3.0000 mg | ORAL_TABLET | Freq: Every day | ORAL | 3 refills | Status: DC

## 2021-03-13 NOTE — Telephone Encounter (Signed)
PATIENT CALLED AND SAID KRISTEN WANTED HER TO TAKE TRULANCE,  HER PHARMACY NEEDS PRIOR AUTH

## 2021-03-13 NOTE — Telephone Encounter (Signed)
Spoke with pt. Pt is aware that Aliene Altes, PA is reviewing the previous message sent and we'll call pt back.

## 2021-03-13 NOTE — Telephone Encounter (Signed)
Noted  

## 2021-03-13 NOTE — Telephone Encounter (Signed)
Spoke with patient.  She feels her RLQ abdominal pain is worsening.  Feels that it is worse than prior to having hip surgery. Can get up to 7/10 at times.  It is now constant rather than only associated with movement.  Bowels are moving fairly well with Linzess twice daily.  She is wondering if Linzess is contributing to some of her abdominal pain and would like to try a different medication.  Denies nausea or vomiting.  Symptoms are not associated with meals.  She is scheduled for an appointment next Tuesday, but is requesting to be seen sooner.  States something needs to be done.  We ordered a CT in February for similar symptoms with no significant findings aside from hip fracture. Notably, this was ordered after a discussion over the phone as well. I am hesitant to order another CT without seeing patient in person.  Recommended stopping Linzess.  I have sent Trulance 3 mg daily to her pharmacy.  I suspect she will need a prior authorization for this.  In the meantime, while waiting to get Trulance, she will start MiraLAX twice daily.  I also looked at our office schedule this week, and we have 1 opening between all APPs which is with Walden Field, NP on Thursday at 9:30 AM.  Patient would like to go ahead and come in on Thursday rather than waiting for her appointment on Tuesday.  Stacey: Please schedule patient with Walden Field on Thursday at 3:22 am.   Elmo Putt, patient may be calling about Trulance. She was going to call her pharmacy to see how much it will cost. Suspect she may need a PA.

## 2021-03-13 NOTE — Telephone Encounter (Signed)
Patient returned call, please call back  

## 2021-03-15 ENCOUNTER — Encounter: Payer: Self-pay | Admitting: Nurse Practitioner

## 2021-03-15 ENCOUNTER — Ambulatory Visit: Payer: Medicare HMO | Admitting: Nurse Practitioner

## 2021-03-15 ENCOUNTER — Encounter (HOSPITAL_COMMUNITY): Payer: Self-pay | Admitting: *Deleted

## 2021-03-15 ENCOUNTER — Emergency Department (HOSPITAL_COMMUNITY): Payer: Medicare HMO

## 2021-03-15 ENCOUNTER — Emergency Department (HOSPITAL_COMMUNITY)
Admission: EM | Admit: 2021-03-15 | Discharge: 2021-03-15 | Disposition: A | Discharge status: Home / Self Care | Payer: Medicare HMO | Attending: Emergency Medicine | Admitting: Emergency Medicine

## 2021-03-15 ENCOUNTER — Other Ambulatory Visit: Payer: Self-pay

## 2021-03-15 VITALS — BP 150/91 | HR 99 | Temp 97.1°F | Ht 64.0 in | Wt 132.6 lb

## 2021-03-15 DIAGNOSIS — R634 Abnormal weight loss: Secondary | ICD-10-CM | POA: Diagnosis not present

## 2021-03-15 DIAGNOSIS — R109 Unspecified abdominal pain: Secondary | ICD-10-CM | POA: Insufficient documentation

## 2021-03-15 DIAGNOSIS — Z85828 Personal history of other malignant neoplasm of skin: Secondary | ICD-10-CM | POA: Diagnosis not present

## 2021-03-15 DIAGNOSIS — J45909 Unspecified asthma, uncomplicated: Secondary | ICD-10-CM | POA: Insufficient documentation

## 2021-03-15 DIAGNOSIS — R9431 Abnormal electrocardiogram [ECG] [EKG]: Secondary | ICD-10-CM | POA: Diagnosis not present

## 2021-03-15 DIAGNOSIS — K5900 Constipation, unspecified: Secondary | ICD-10-CM | POA: Diagnosis not present

## 2021-03-15 DIAGNOSIS — E039 Hypothyroidism, unspecified: Secondary | ICD-10-CM | POA: Diagnosis not present

## 2021-03-15 DIAGNOSIS — Z8616 Personal history of COVID-19: Secondary | ICD-10-CM | POA: Diagnosis not present

## 2021-03-15 DIAGNOSIS — K219 Gastro-esophageal reflux disease without esophagitis: Secondary | ICD-10-CM | POA: Diagnosis not present

## 2021-03-15 DIAGNOSIS — I10 Essential (primary) hypertension: Secondary | ICD-10-CM | POA: Diagnosis not present

## 2021-03-15 DIAGNOSIS — R531 Weakness: Secondary | ICD-10-CM | POA: Diagnosis not present

## 2021-03-15 DIAGNOSIS — Z79899 Other long term (current) drug therapy: Secondary | ICD-10-CM | POA: Insufficient documentation

## 2021-03-15 DIAGNOSIS — Z7982 Long term (current) use of aspirin: Secondary | ICD-10-CM | POA: Diagnosis not present

## 2021-03-15 DIAGNOSIS — Z87891 Personal history of nicotine dependence: Secondary | ICD-10-CM | POA: Insufficient documentation

## 2021-03-15 DIAGNOSIS — Z96641 Presence of right artificial hip joint: Secondary | ICD-10-CM | POA: Diagnosis not present

## 2021-03-15 DIAGNOSIS — R1031 Right lower quadrant pain: Secondary | ICD-10-CM

## 2021-03-15 DIAGNOSIS — Z7951 Long term (current) use of inhaled steroids: Secondary | ICD-10-CM | POA: Insufficient documentation

## 2021-03-15 LAB — COMPREHENSIVE METABOLIC PANEL
ALT: 15 U/L (ref 0–44)
AST: 24 U/L (ref 15–41)
Albumin: 3.8 g/dL (ref 3.5–5.0)
Alkaline Phosphatase: 69 U/L (ref 38–126)
Anion gap: 12 (ref 5–15)
BUN: 10 mg/dL (ref 8–23)
CO2: 25 mmol/L (ref 22–32)
Calcium: 8.8 mg/dL — ABNORMAL LOW (ref 8.9–10.3)
Chloride: 102 mmol/L (ref 98–111)
Creatinine, Ser: 0.66 mg/dL (ref 0.44–1.00)
GFR, Estimated: >60 mL/min (ref >60)
Glucose, Bld: 88 mg/dL (ref 70–99)
Potassium: 3.3 mmol/L — ABNORMAL LOW (ref 3.5–5.1)
Sodium: 139 mmol/L (ref 135–145)
Total Bilirubin: 0.4 mg/dL (ref 0.3–1.2)
Total Protein: 6.6 g/dL (ref 6.5–8.1)

## 2021-03-15 LAB — CBC
HCT: 36.5 % (ref 36.0–46.0)
Hemoglobin: 11.5 g/dL — ABNORMAL LOW (ref 12.0–15.0)
MCH: 30.8 pg (ref 26.0–34.0)
MCHC: 31.5 g/dL (ref 30.0–36.0)
MCV: 97.9 fL (ref 80.0–100.0)
Platelets: 199 10*3/uL (ref 150–400)
RBC: 3.73 MIL/uL — ABNORMAL LOW (ref 3.87–5.11)
RDW: 15.1 % (ref 11.5–15.5)
WBC: 9.3 10*3/uL (ref 4.0–10.5)
nRBC: 0 % (ref 0.0–0.2)

## 2021-03-15 LAB — LIPASE, BLOOD: Lipase: 23 U/L (ref 11–51)

## 2021-03-15 MED ORDER — IOHEXOL 300 MG/ML  SOLN
100.0000 mL | Freq: Once | INTRAMUSCULAR | Status: AC | PRN
  Administered 2021-03-15: 75 mL via INTRAVENOUS

## 2021-03-15 MED ORDER — HYOSCYAMINE SULFATE SL 0.125 MG SL SUBL: 1.0000 | SUBLINGUAL_TABLET | Freq: Three times a day (TID) | SUBLINGUAL | 0 refills | Status: DC | PRN

## 2021-03-15 NOTE — Progress Notes (Signed)
Referring Provider: [name], MD Primary Care Physician:  [name], MD Primary GI:  Dr. Gala Romney  Chief Complaint  Patient presents with  . Abdominal Pain    RLQ pain, terrible pain. No nausea/vomiting  . Constipation    Started taking miralax 2 days ago and so far is having BM daily. Trulance not covered    HPI:   [name] is a 80 y.o. female who presents for right upper quadrant pain, constipation, and nausea/vomiting.  Also needing follow-up EGD.  Patient was last seen in our office 11/02/2020 for weight loss, constipation, nausea/vomiting, change in stool caliber.  Colonoscopy in 2017 with pancolonic diverticulosis, otherwise normal and the recommendations repeat.  Began having nausea in May/June with associated right side pain taking ibuprofen and status post empiric treatment for colitis with Cipro by primary care.  CT of the abdomen pelvis in June 2021 for abdominal pain, nausea, loose stools found diverticulosis without diverticulitis no other acute findings.  Has had associated weight loss of 24 pounds over the last year.  Nausea resolved with PPI at some point.  Had decreased appetite when she started Effexor (known possible adverse effect).  Given weight loss, change in appetite, change in bowel habits (more constipation) recommended colonoscopy and EGD.  However, she called in September to cancel her procedures until thyroid abnormalities addressed.  Constipation she was trialed on Linzess 72 mcg daily.  She followed up by phone requesting to discontinue Protonix is not having any chest pain, abdominal pain, GERD symptoms and it was recommended that she decrease to once a day for now and call with a progress report.  At her last visit noted continued weight loss of 17 pounds over 6 months, continue anxiety, continued resolution of nausea on Protonix 40 mg daily.  Continued decreased appetite, has not addressed Effexor with primary care yet.  Taking MiraLAX nightly  for constipation which she has daily bowel movements.  Recommended reschedule colonoscopy and EGD, continue Ensure supplementation, trial Linzess samples provided, discuss Effexor with primary care, follow-up after procedures.  She followed up with our office indicating Linzess not as effective, has stopped MiraLAX while taking Linzess.  Recommended increase to 145 mcg dosing.  However, the patient wanted to stay on 72.  She again called our office 11/20/2020 indicating she was started on Linzess and with that she has strained and is now her side is hurting.  Recommended increasing to 145 mcg dosing.  The patient continued to call with abdominal pain and on 01/25/2021 she underwent CT of the abdomen and pelvis.  Findings included subacute impaction of the right subcapital femoral neck, diverticulosis without diverticulitis, absent gallbladder.  The patient was informed right lower quadrant pain likely due to femoral neck fracture.  She was referred to orthopedic.  Colonoscopy and upper endoscopy were completed 12/21/2020.  Colonoscopy found diverticulosis, otherwise normal.  Recommended no repeat colonoscopy due to age.  EGD the same day found erosive reflux esophagitis, nonbleeding gastric ulcers with no stigmata of bleeding status post biopsy, normal duodenum.  Surgical pathology found the biopsies to be gastric antral mucosa with evidence of erosion/ulceration, negative for H. pylori.  Recommended repeat EGD in 3 months, start Protonix 40 mg twice daily, avoid NSAIDs.  Today she states she is having continued abdominal pain.  She began taking MiraLAX bid a couple days ago and since then she has been having a daily bowel movement. States her pain started up again 2-3 weeks ago, waxes and  wanes in severity. Has seemed to get worse over the past week. Pain is RLQ, described as dull and sharp. Denies N/V. Denies hematochezia, melena. Had 3 stools yesterday. Has complete emptying, no straining/hard stools in  the last week; previous she was having straining. Denies fever, chills, unintentional weight loss. Denies URI or flu-like symptoms. Denies loss of sense of taste or smell. Denies chest pain, dyspnea, dizziness, lightheadedness, syncope, near syncope. Denies any other upper or lower GI symptoms.  She felt when she had a good bowel movement yesterday that her pain may have improved. This morning it was hurting worse again; had a small stool this morning, but didn't get a lot. Hasn't had MiraLAX yet this morning.  She did have hip replacement and saw ortho a couple days ago and was told her pain has "nothing to do with the hip replacement."  Past Medical History:  Diagnosis Date  . Anemia   . Anxiety   . Arthritis   . Asthma   . Constipation   . GERD (gastroesophageal reflux disease)   . High blood pressure   . History of COVID-19 12/28/2020  . Hypothyroidism   . PAD (peripheral artery disease) (HCC)    bilateral legs  . Peripheral edema   . Peripheral neuropathy    bilateral feet at night occ  . Psoriasis   . Seasonal allergies   . Skin cancer    Left eye brow  . Varicose veins   . Vertigo 11/23/2013    Past Surgical History:  Procedure Laterality Date  . APPENDECTOMY     patient unsure  . BIOPSY  12/21/2020   Procedure: BIOPSY;  Surgeon: Daneil Dolin, MD;  [facility]: [facility];  Service: Endoscopy;;  . CATARACT EXTRACTION W/PHACO Left 01/27/2017   Procedure: CATARACT EXTRACTION PHACO AND INTRAOCULAR LENS PLACEMENT ([place]);  Surgeon: Tonny Branch, MD;  Location: [facility];  Service: Ophthalmology;  Laterality: Left;  CDE: 29.90  . CATARACT EXTRACTION W/PHACO Right 02/24/2017   Procedure: CATARACT EXTRACTION PHACO AND INTRAOCULAR LENS PLACEMENT ([facility]);  Surgeon: Tonny Branch, MD;  Location: [facility];  Service: Ophthalmology;  Laterality: Right;  CDE: 8.57  . CHOLECYSTECTOMY    . COLONOSCOPY N/A 10/01/2016   Procedure: COLONOSCOPY;  Surgeon: Daneil Dolin, MD; pancolonic diverticulosis,  melanosis coli, otherwise normal exam.  No recommendations to repeat colonoscopy.  . COLONOSCOPY WITH PROPOFOL N/A 12/21/2020   Procedure: COLONOSCOPY WITH PROPOFOL;  Surgeon: Daneil Dolin, MD;  [facility];  Service: Endoscopy;  Laterality: N/A;  10:30am  . ESOPHAGOGASTRODUODENOSCOPY (EGD) WITH PROPOFOL N/A 12/21/2020   Procedure: ESOPHAGOGASTRODUODENOSCOPY (EGD) WITH PROPOFOL;  Surgeon: Daneil Dolin, MD;  [facility];  Service: Endoscopy;  Laterality: N/A;  . FLEXOR TENDON REPAIR Right 11/06/2017   Procedure: RIGHT WRIST FLEXOR TENDON REPAIRAND STT [place];  Surgeon: Leanora Cover, MD;  Location: [facility];  Service: Orthopedics;  Laterality: Right;  . MOUTH SURGERY     artery bleed  . TOTAL HIP ARTHROPLASTY Right 01/31/2021   Procedure: TOTAL HIP ARTHROPLASTY ANTERIOR APPROACH;  Surgeon: Rod Can, MD;  Location: [facility];  Service: Orthopedics;  Laterality: Right;  . TUBAL LIGATION      Current Outpatient Medications  Medication Sig Dispense Refill  . acetaminophen (TYLENOL) 500 MG tablet Take 500 mg by mouth every 6 (six) hours as needed for mild pain or moderate pain.    Marland Kitchen albuterol (VENTOLIN HFA) 108 (90 Base) MCG/ACT inhaler Inhale 2 puffs into the lungs  every 6 (six) hours as needed for wheezing or shortness of breath.    . ALPRAZolam (XANAX) 0.5 MG tablet Take 0.5 mg by mouth in the morning, at noon, and at bedtime. (1000, 1500, AT BEDTIME)    . amLODipine (NORVASC) 2.5 MG tablet Take 2.5 mg by mouth every evening.     Marland Kitchen aspirin EC 81 MG tablet Take 81 mg by mouth daily. Swallow whole.    . chlorhexidine (PERIDEX) 0.12 % solution Use as directed 15 mLs in the mouth or throat 2 (two) times daily as needed (mouth ulcers).    . Cholecalciferol (VITAMIN D3) 50 MCG (2000 UT) capsule Take 2,000 Units by mouth daily.    . fluticasone (FLONASE) 50 MCG/ACT nasal spray Place 2 sprays into both nostrils daily as needed for allergies.    Marland Kitchen  levothyroxine (SYNTHROID) 50 MCG tablet Take 1 tablet (50 mcg total) by mouth daily. (Patient taking differently: Take 50 mcg by mouth daily at 6 (six) AM.) 90 tablet 3  . losartan (COZAAR) 25 MG tablet Take 25 mg by mouth daily. IN THE MORNING.    . Multiple Vitamin (MULTIVITAMIN WITH MINERALS) TABS tablet Take 1 tablet by mouth daily in the afternoon.    . NONFORMULARY OR COMPOUNDED ITEM Apply 1 application topically 3 (three) times daily as needed (foot neuropathy). neuropathy cream dic 3%/bac 2%/gab %/lid 5%/meth 1%    . pantoprazole (PROTONIX) 40 MG tablet Take 40 mg by mouth 2 (two) times daily.    . polyethylene glycol (MIRALAX / GLYCOLAX) 17 g packet Take 17 g by mouth 2 (two) times daily.    Marland Kitchen PREVIDENT 5000 BOOSTER PLUS 1.1 % PSTE Place 1 application onto teeth at bedtime.    . raloxifene (EVISTA) 60 MG tablet Take 1 tablet (60 mg total) by mouth every morning. (Patient taking differently: Take 60 mg by mouth daily. IN THE MORNING) 30 tablet 12  . senna (SENOKOT) 8.6 MG TABS tablet Take 1 tablet (8.6 mg total) by mouth in the morning and at bedtime. (Patient taking differently: Take 1 tablet by mouth in the morning and at bedtime. As needed) 120 tablet 0  . SYMBICORT 80-4.5 MCG/ACT inhaler INHALE 2 PUFFS INTO THE LUNGS TWICE DAILY. (Patient taking differently: Inhale 2 puffs into the lungs daily.) 10.2 g 0  . Turmeric Curcumin 500 MG CAPS Take 500 mg by mouth daily.    Marland Kitchen venlafaxine XR (EFFEXOR-XR) 37.5 MG 24 hr capsule Take 37.5 mg by mouth daily with breakfast.      No current facility-administered medications for this visit.    Allergies as of 03/15/2021 - Review Complete 03/15/2021  Allergen Reaction Noted  . Amoxicillin-pot clavulanate Nausea And Vomiting, Other (See Comments), Itching, and Nausea Only 10/04/2013  . Ace inhibitors Cough   . Entex la Itching   . Levofloxacin Hives and Itching 01/21/2017  . Meloxicam Other (See Comments) 04/25/2020    Family History  Problem  Relation Age of Onset  . Hypertension Mother   . Varicose Veins Father   . Heart attack Father   . Heart disease Father   . Hypertension Brother   . COPD Brother     Social History   Socioeconomic History  . Marital status: Married    Spouse name: Not on file  . Number of children: Not on file  . Years of education: Not on file  . Highest education level: Not on file  Occupational History  . Not on file  Tobacco Use  .  Smoking status: Former Smoker    Packs/day: 0.25    Years: 5.00    Pack years: 1.25    Types: Cigarettes    Quit date: 12/02/1997    Years since quitting: 23.2  . Smokeless tobacco: Never Used  Vaping Use  . Vaping Use: Never used  Substance and Sexual Activity  . Alcohol use: Yes    Alcohol/week: 1.0 standard drink    Types: 1 Glasses of wine per week    Comment: occasional wine  . Drug use: No  . Sexual activity: Yes    Birth control/protection: Surgical    Comment: tubal  Other Topics Concern  . Not on file  Social History Narrative  . Not on file   Social Determinants of Health   Financial Resource Strain: Low Risk   . Difficulty of Paying Living Expenses: Not hard at all  Food Insecurity: No Food Insecurity  . Worried About Charity fundraiser in the Last Year: Never true  . Ran Out of Food in the Last Year: Never true  Transportation Needs: No Transportation Needs  . Lack of Transportation (Medical): No  . Lack of Transportation (Non-Medical): No  Physical Activity: Inactive  . Days of Exercise per Week: 0 days  . Minutes of Exercise per Session: 0 min  Stress: Stress Concern Present  . Feeling of Stress : To some extent  Social Connections: Socially Integrated  . Frequency of Communication with Friends and Family: More than three times a week  . Frequency of Social Gatherings with Friends and Family: More than three times a week  . Attends Religious Services: More than 4 times per year  . Active Member of Clubs or Organizations: Yes   . Attends Archivist Meetings: More than 4 times per year  . Marital Status: Married    Subjective: Review of Systems  Constitutional: Negative for chills, fever, malaise/fatigue and weight loss.  HENT: Negative for congestion and sore throat.   Respiratory: Negative for cough and shortness of breath.   Cardiovascular: Negative for chest pain and palpitations.  Gastrointestinal: Positive for abdominal pain and constipation. Negative for blood in stool, diarrhea, heartburn, melena, nausea and vomiting.  Musculoskeletal: Negative for joint pain and myalgias.  Skin: Negative for rash.  Neurological: Negative for dizziness and weakness.  Endo/Heme/Allergies: Does not bruise/bleed easily.  Psychiatric/Behavioral: Negative for depression. The patient is not nervous/anxious.   All other systems reviewed and are negative.    Objective: BP (!) 150/91   Pulse 99   Temp (!) 97.1 F (36.2 C)   Ht 5\' 4"  (1.626 m)   Wt 132 lb 9.6 oz (60.1 kg)   BMI 22.76 kg/m  Physical Exam Vitals and nursing note reviewed.  Constitutional:      General: She is not in acute distress.    Appearance: Normal appearance. She is well-developed and normal weight. She is not ill-appearing, toxic-appearing or diaphoretic.  HENT:     Head: Normocephalic and atraumatic.     Nose: No congestion or rhinorrhea.  Eyes:     General: No scleral icterus. Cardiovascular:     Rate and Rhythm: Normal rate and regular rhythm.     Heart sounds: Normal heart sounds.  Pulmonary:     Effort: Pulmonary effort is normal. No respiratory distress.     Breath sounds: Normal breath sounds.  Abdominal:     General: Bowel sounds are normal.     Palpations: Abdomen is soft. There is no hepatomegaly, splenomegaly  or mass.     Tenderness: There is no abdominal tenderness. There is no guarding or rebound.     Hernia: No hernia is present.  Skin:    General: Skin is warm and dry.     Coloration: Skin is not jaundiced.      Findings: No rash.  Neurological:     General: No focal deficit present.     Mental Status: She is alert and oriented to person, place, and time.  Psychiatric:        Attention and Perception: Attention normal.        Mood and Affect: Mood normal.        Speech: Speech normal.        Behavior: Behavior normal.        Thought Content: Thought content normal.        Cognition and Memory: Cognition and memory normal.      Assessment:  80 year old female presents to follow-up on abdominal pain.  She does have a baseline history of constipation tried multiple agents.  She has been taking MiraLAX twice a day for the past couple days and occasionally has a complete bowel movement, sometimes she has a small incomplete bowel movement.  She has had on and off abdominal pain waxing and severity for the past 2 to 3 weeks.  Over the past week it seems to be getting worse.  Today she states her pain is 9 out of 10.  Described as dull, occasionally sharp.  She seems to be quite uncomfortable today on exam.  However, no severe/worsening pain on palpation.  There are multiple etiologies at this point.  Given her history of diverticular disease she could have diverticulitis, colitis, less likely appendicitis (unsure of any history of appendectomy, although it could not be identified on the CT previously).  Also less likely complication from recent hip arthroplasty.  Constipation could also be playing a role as well.  Given the severity of her pain and her concerns I recommended she proceed to the emergency department.  She seems a bit unsure at this time.  At the very least she should have basic labs including CBC, CMP, lipase, CT scan of the abdomen and pelvis.   After further discussion the patient is decided to proceed the emergency room based on our recommendations.   Plan: 1. Proceed to the emergency room for further evaluation 2. Continue MiraLAX as an outpatient 3. Further recommendations to follow  ED visit 4. Follow-up in 4 weeks.    Thank you for allowing [place] to participate in the care of [place], DNP, AGNP-C Adult & Gerontological Nurse Practitioner [facility]   03/15/2021 10:15 AM   Disclaimer: This note was dictated with voice recognition software. Similar sounding words can inadvertently be transcribed and may not be corrected upon review.

## 2021-03-15 NOTE — Telephone Encounter (Signed)
Can we find out what would be covered?

## 2021-03-15 NOTE — Patient Instructions (Addendum)
Your health issues we discussed today were:   Abdominal pain with no constipation: 1. Continue using MiraLAX as you have been 2. Proceed to the emergency room based on our discussions 3. Further recommendations will come from the emergency room   Need for repeat endoscopy: 1. We will need to schedule you for repeat endoscopy 2. We will complete this at your follow-up visit 3. Call us for any worsening heartburn/reflux symptoms.  Overall I recommend:  1. Continue other current medications 2. Return for follow-up in 4 weeks 3. Call us if you have any questions or concerns   ---------------------------------------------------------------  At [facility] we value your feedback. You may receive a survey about your visit today. Please share your experience as we strive to create trusting relationships with our patients to provide genuine, compassionate, quality care.  We appreciate your understanding and patience as we review any laboratory studies, imaging, and other diagnostic tests that are ordered as we care for you. Our office policy is 5 business days for review of these results, and any emergent or urgent results are addressed in a timely manner for your best interest. If you do not hear from our office in 1 week, please contact us.   We also encourage the use of MyChart, which contains your medical information for your review as well. If you are not enrolled in this feature, an access code is on this after visit summary for your convenience. Thank you for allowing [place] to be involved in your care.  It was great to see you today!  I hope you have a great spring!!    ---------------------------------------------------------------

## 2021-03-15 NOTE — ED Notes (Signed)
Patient IV swollen, not flushing, this RN called SWOT nurse to start IV for patient.

## 2021-03-15 NOTE — ED Triage Notes (Signed)
Right lower quadrant pain for 3 weeks, referred by GI today

## 2021-03-15 NOTE — Telephone Encounter (Signed)
Received denial letter for Trulance. An appeal can be done. Pt saw Walden Field, NP today. Pt was sent to the ED. Do we need to hold off on appeal or complete it?

## 2021-03-15 NOTE — Telephone Encounter (Signed)
PA has been submitted to [email or web address]. waiting on an approval or denial.

## 2021-03-15 NOTE — Discharge Instructions (Addendum)
Your lab tests and CT imaging tonight are negative for the source of your symptoms. Your potassium level is borderline low but should not be the source of your symptoms (although severe low potassium can cause constipation).  I recommend adding increased potassium to your diet (bananas, orange juice).  You have been prescribed a medicine to help with intestinal spasm which you may try - only take this if you have an episode of pain - it works quickly to help reduce intestinal colic which may be the source of your symptoms. Followup if your symptoms are not improving with this treatment plan.

## 2021-03-15 NOTE — ED Provider Notes (Signed)
abd pain X 2 weeks - GI offices sent her over for labs and CT scan -  VS normal Labs and CT normal  On exam has mild ttp - no guarding   Stable for d/c.  Medical screening examination/treatment/procedure(s) were conducted as a shared visit with non-physician practitioner(s) and myself.  I personally evaluated the patient during the encounter.  Clinical Impression:   Final diagnoses:  Right lower quadrant abdominal pain         Noemi Chapel, MD 03/22/21 1420

## 2021-03-15 NOTE — ED Provider Notes (Signed)
[facility] EMERGENCY DEPARTMENT Provider Note   CSN: [id number] Arrival date & time: 03/15/21  1100     History Chief Complaint  Patient presents with  . Abdominal Pain    [name] is a 80 y.o. female with a history including asthma, GERD, hypertension and history of constipation improved with daily Miralax presenting with right lower quadrant pain, sharp, stabbing, waxing and waning for the past 2 weeks.  When severe, she describes 10/10 sharp pain which is worsened with movement and palpation.  She was seen by her GI specialist today and sent here for further evaluation.  She denies nausea, vomiting and fevers, also denies dysuria, hematuria or flank pain.  She has found no alleviators for her symptoms. HPI     Past Medical History:  Diagnosis Date  . Anemia   . Anxiety   . Arthritis   . Asthma   . Constipation   . GERD (gastroesophageal reflux disease)   . High blood pressure   . History of COVID-19 12/28/2020  . Hypothyroidism   . PAD (peripheral artery disease) (HCC)    bilateral legs  . Peripheral edema   . Peripheral neuropathy    bilateral feet at night occ  . Psoriasis   . Seasonal allergies   . Skin cancer    Left eye brow  . Varicose veins   . Vertigo 11/23/2013    Patient Active Problem List   Diagnosis Date Noted  . Abdominal pain 03/15/2021  . Fracture of femoral neck, right ([place]) 01/31/2021  . Closed displaced fracture of right femoral neck ([place]) 01/31/2021  . Indigestion 11/02/2020  . Change in stool caliber 11/02/2020  . Loss of weight 08/23/2020  . Nausea without vomiting 06/15/2020  . Tick bite 04/10/2020  . Medication refill 04/10/2020  . Acquired trigger finger 03/28/2020  . Pain in right knee 10/08/2019  . Acute maxillary sinusitis 08/20/2019  . Cellulitis, leg 08/10/2019  . Cellulitis 08/08/2019  . Fever 08/08/2019  . Cellulitis of foot 08/08/2019  . Hypokalemia 08/08/2019  . Hyponatremia 08/08/2019  . Essential  hypertension 08/08/2019  . 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  . Adverse food reaction 09/24/2018  . Mild persistent asthma without complication 72/53/6644  . Pain of left hip joint 07/17/2018  . Dyspnea on exertion 01/24/2017  . Cough variant asthma  vs UACS 01/24/2017  . High risk medication use 09/25/2016  . Varicose veins of lower extremities with other complications 03/47/4259  . Varicose veins 02/15/2014  . Vertigo 11/23/2013  . Pain in limb 10/04/2013  . Hip bursitis 02/11/2013  . Constipation 02/16/2009  . RECTAL BLEEDING 02/16/2009  . HIGH BLOOD PRESSURE 01/29/2008    Past Surgical History:  Procedure Laterality Date  . APPENDECTOMY     patient unsure  . BIOPSY  12/21/2020   Procedure: BIOPSY;  Surgeon: Daneil Dolin, MD;  Location: [facility] ENDO SU[facility];  Service: Endoscopy;;  . CATARACT EXTRACTION W/PHACO Left 01/27/2017   Procedure: CATARACT EXTRACTION PHACO AND INTRAOCULAR LENS PLACEMENT ([place]);  Surgeon: Tonny Branch, MD;  Location: [facility] ORS;  Service: Ophthalmology;  Laterality: Left;  CDE: 29.90  . CATARACT EXTRACTION W/PHACO Right 02/24/2017   Procedure: CATARACT EXTRACTION PHACO AND INTRAOCULAR LENS PLACEMENT (IOC);  Surgeon: Tonny Branch, MD;  Location: [facility] ORS;  Service: Ophthalmology;  Laterality: Right;  CDE: 8.57  . CHOLECYSTECTOMY    . COLONOSCOPY N/A 10/01/2016   Procedure: COLONOSCOPY;  Surgeon: Daneil Dolin, MD; pancolonic diverticulosis, melanosis coli, otherwise normal exam.  No recommendations  to repeat colonoscopy.  . COLONOSCOPY WITH PROPOFOL N/A 12/21/2020   Procedure: COLONOSCOPY WITH PROPOFOL;  Surgeon: Daneil Dolin, MD;  Location: [facility];  Service: Endoscopy;  Laterality: N/A;  10:30am  . ESOPHAGOGASTRODUODENOSCOPY (EGD) WITH PROPOFOL N/A 12/21/2020   Procedure: ESOPHAGOGASTRODUODENOSCOPY (EGD) WITH PROPOFOL;  Surgeon: Daneil Dolin, MD;  Location: [facility];  Service: Endoscopy;  Laterality: N/A;  . FLEXOR TENDON REPAIR Right 11/06/2017    Procedure: RIGHT WRIST FLEXOR TENDON REPAIRAND STT [place];  Surgeon: Leanora Cover, MD;  Location: [facility];  Service: Orthopedics;  Laterality: Right;  . MOUTH SURGERY     artery bleed  . TOTAL HIP ARTHROPLASTY Right 01/31/2021   Procedure: TOTAL HIP ARTHROPLASTY ANTERIOR APPROACH;  Surgeon: Rod Can, MD;  Location: [facility] ORS;  Service: Orthopedics;  Laterality: Right;  . TUBAL LIGATION       OB History    Gravida  2   Para  2   Term      Preterm      AB      Living  2     SAB      IAB      Ectopic      Multiple      Live Births  2           Family History  Problem Relation Age of Onset  . Hypertension Mother   . Varicose Veins Father   . Heart attack Father   . Heart disease Father   . Hypertension Brother   . COPD Brother     Social History   Tobacco Use  . Smoking status: Former Smoker    Packs/day: 0.25    Years: 5.00    Pack years: 1.25    Types: Cigarettes    Quit date: 12/02/1997    Years since quitting: 23.2  . Smokeless tobacco: Never Used  Vaping Use  . Vaping Use: Never used  Substance Use Topics  . Alcohol use: Yes    Alcohol/week: 1.0 standard drink    Types: 1 Glasses of wine per week    Comment: occasional wine  . Drug use: No    Home Medications Prior to Admission medications   Medication Sig Start Date End Date Taking? Authorizing Provider  Hyoscyamine Sulfate SL (LEVSIN/SL) 0.125 MG SUBL Place 1 tablet under the tongue 3 (three) times daily as needed (severe abdominal pain). 03/15/21  Yes Dereon Williamsen, Almyra Free, PA-C  acetaminophen (TYLENOL) 500 MG tablet Take 500 mg by mouth every 6 (six) hours as needed for mild pain or moderate pain.    [provider]  albuterol (VENTOLIN HFA) 108 (90 Base) MCG/ACT inhaler Inhale 2 puffs into the lungs every 6 (six) hours as needed for wheezing or shortness of breath.    [provider]  ALPRAZolam Duanne Moron) 0.5 MG tablet Take 0.5 mg by mouth in the morning, at  noon, and at bedtime. (1000, 1500, AT BEDTIME)    [provider]  amLODipine (NORVASC) 2.5 MG tablet Take 2.5 mg by mouth every evening.  02/09/13   [provider]  aspirin EC 81 MG tablet Take 81 mg by mouth daily. Swallow whole.    [provider]  chlorhexidine (PERIDEX) 0.12 % solution Use as directed 15 mLs in the mouth or throat 2 (two) times daily as needed (mouth ulcers).    [provider]  Cholecalciferol (VITAMIN D3) 50 MCG (2000 UT) capsule Take 2,000 Units by mouth daily.  [provider]  fluticasone (FLONASE) 50 MCG/ACT nasal spray Place 2 sprays into both nostrils daily as needed for allergies.    [name], MD  levothyroxine (SYNTHROID) 50 MCG tablet Take 1 tablet (50 mcg total) by mouth daily. Patient taking differently: Take 50 mcg by mouth daily at 6 (six) AM. 08/02/20   Brita Romp, NP  losartan (COZAAR) 25 MG tablet Take 25 mg by mouth daily. IN THE MORNING. 07/14/20   [provider]  Multiple Vitamin (MULTIVITAMIN WITH MINERALS) TABS tablet Take 1 tablet by mouth daily in the afternoon.    [provider]  NONFORMULARY OR COMPOUNDED ITEM Apply 1 application topically 3 (three) times daily as needed (foot neuropathy). neuropathy cream dic 3%/bac 2%/gab %/lid 5%/meth 1%    [provider]  pantoprazole (PROTONIX) 40 MG tablet Take 40 mg by mouth 2 (two) times daily. 01/17/21   [provider]  polyethylene glycol (MIRALAX / GLYCOLAX) 17 g packet Take 17 g by mouth 2 (two) times daily.    [provider]  PREVIDENT 5000 BOOSTER PLUS 1.1 % PSTE Place 1 application onto teeth at bedtime. 09/28/20   [provider]  raloxifene (EVISTA) 60 MG tablet Take 1 tablet (60 mg total) by mouth every morning. Patient taking differently: Take 60 mg by mouth daily. IN THE MORNING 04/10/20   Estill Dooms, NP  senna (SENOKOT) 8.6 MG TABS tablet Take 1 tablet (8.6 mg total) by mouth in  the morning and at bedtime. Patient taking differently: Take 1 tablet by mouth in the morning and at bedtime. As needed 02/01/21   Cherlynn June B, PA  SYMBICORT 80-4.5 MCG/ACT inhaler INHALE 2 PUFFS INTO THE LUNGS TWICE DAILY. Patient taking differently: Inhale 2 puffs into the lungs daily. 02/02/18   Tanda Rockers, MD  Turmeric Curcumin 500 MG CAPS Take 500 mg by mouth daily.    [provider]  venlafaxine XR (EFFEXOR-XR) 37.5 MG 24 hr capsule Take 37.5 mg by mouth daily with breakfast.  07/10/20   [provider]    Allergies    Amoxicillin-pot clavulanate, Ace inhibitors, Entex la, Levofloxacin, and Meloxicam  Review of Systems   Review of Systems  Constitutional: Negative for chills and fever.  HENT: Negative.   Eyes: Negative.   Respiratory: Negative for chest tightness and shortness of breath.   Cardiovascular: Negative for chest pain.  Gastrointestinal: Positive for abdominal pain and constipation. Negative for diarrhea, nausea and vomiting.  Genitourinary: Negative.  Negative for dysuria, flank pain and hematuria.  Musculoskeletal: Negative for arthralgias, joint swelling and neck pain.  Skin: Negative.  Negative for rash and wound.  Neurological: Negative for dizziness, weakness, light-headedness, numbness and headaches.  Psychiatric/Behavioral: Negative.   All other systems reviewed and are negative.   Physical Exam Updated Vital Signs BP (!) 144/68 (BP Location: Left Arm)   Pulse 70   Temp 97.9 F (36.6 C) (Oral)   Resp 18   SpO2 100%   Physical Exam Vitals and nursing note reviewed.  Constitutional:      Appearance: She is well-developed.  HENT:     Head: Normocephalic and atraumatic.  Eyes:     Conjunctiva/sclera: Conjunctivae normal.  Cardiovascular:     Rate and Rhythm: Normal rate and regular rhythm.     Heart sounds: Normal heart sounds.  Pulmonary:     Effort: Pulmonary effort is normal.     Breath sounds: Normal breath sounds. No  wheezing.  Abdominal:  General: Bowel sounds are normal.     Palpations: Abdomen is soft.     Tenderness: There is abdominal tenderness in the right lower quadrant. There is no right CVA tenderness, left CVA tenderness, guarding or rebound. Negative signs include McBurney's sign.     Hernia: No hernia is present.     Comments: Reports mild soreness during exam currently.   Musculoskeletal:        General: Normal range of motion.     Cervical back: Normal range of motion.  Skin:    General: Skin is warm and dry.  Neurological:     Mental Status: She is alert.     ED Results / Procedures / Treatments   Labs (all labs ordered are listed, but only abnormal results are displayed) Labs Reviewed  COMPREHENSIVE METABOLIC PANEL - Abnormal; Notable for the following components:      Result Value   Potassium 3.3 (*)    Calcium 8.8 (*)    All other components within normal limits  CBC - Abnormal; Notable for the following components:   RBC 3.73 (*)    Hemoglobin 11.5 (*)    All other components within normal limits  LIPASE, BLOOD  URINALYSIS, ROUTINE W REFLEX MICROSCOPIC    EKG None  ED ECG REPORT    Radiology CT ABDOMEN PELVIS W CONTRAST  Result Date: 03/15/2021 CLINICAL DATA:  Right lower quadrant abdominal pain for 3 weeks EXAM: CT ABDOMEN AND PELVIS WITH CONTRAST TECHNIQUE: Multidetector CT imaging of the abdomen and pelvis was performed using the standard protocol following bolus administration of intravenous contrast. CONTRAST:  73mL OMNIPAQUE IOHEXOL 300 MG/ML  SOLN COMPARISON:  01/25/2021 FINDINGS: Lower chest: No acute pleural or parenchymal lung disease. Hepatobiliary: No focal liver abnormality is seen. Status post cholecystectomy. Stable intra and extrahepatic biliary duct dilation is likely related to previous cholecystectomy. Pancreas: Unremarkable. No pancreatic ductal dilatation or surrounding inflammatory changes. Spleen: Normal in size without focal abnormality.  Adrenals/Urinary Tract: Adrenal glands are unremarkable. Kidneys are normal, without renal calculi, focal lesion, or hydronephrosis. Bladder is unremarkable. Evaluation of the lower pelvis is limited by streak artifact from right hip arthroplasty. Stomach/Bowel: No bowel obstruction or ileus. Scattered diverticulosis of the distal colon without diverticulitis. The appendix is not well visualized and may be surgically absent. No inflammatory changes in the right lower quadrant. No bowel wall thickening. Vascular/Lymphatic: Aortic atherosclerosis. No enlarged abdominal or pelvic lymph nodes. Reproductive: Uterus and bilateral adnexa are unremarkable. Other: No free fluid or free gas.  No abdominal wall hernia. Musculoskeletal: There are no acute or destructive bony lesions. Postsurgical changes are seen from interval right hip arthroplasty. Postoperative seroma anterior to the right hip prosthesis measuring 5.1 x 2.8 x 6.6 cm. Reconstructed images demonstrate no additional findings. IMPRESSION: 1. No acute intra-abdominal or intrapelvic process. 2. Distal colonic diverticulosis without diverticulitis. 3. Postsurgical changes from interval right hip arthroplasty, with postoperative seroma anterior to the right hip. 4.  Aortic Atherosclerosis (ICD10-I70.0). Electronically Signed   By: Randa Ngo M.D.   On: 03/15/2021 18:00    Procedures Procedures   Medications Ordered in ED Medications  iohexol (OMNIPAQUE) 300 MG/ML solution 100 mL (75 mLs Intravenous Contrast Given 03/15/21 1737)    ED Course  I have reviewed the triage vital signs and the nursing notes.  Pertinent labs & imaging results that were available during my care of the patient were reviewed by me and considered in my medical decision making (see chart for details).  MDM Rules/Calculators/A&P                          Pt with a 2 week history of sharp intermittent RLQ pain, labs and Ct imaging unremarkable.  Possibly intestinal  spasm/colic, short course of levsin suggested. Plan f/u with GI if sx persist.  Mild hypokalemia, recommended dietary additions.  No appy, diverticulitis, obstruction.    The patient appears reasonably screened and/or stabilized for discharge and I doubt any other medical condition or other [facility] requiring further screening, evaluation, or treatment in the ED at this time prior to discharge.    Final Clinical Impression(s) / ED Diagnoses Final diagnoses:  Right lower quadrant abdominal pain    Rx / DC Orders ED Discharge Orders         Ordered    Hyoscyamine Sulfate SL (LEVSIN/SL) 0.125 MG SUBL  3 times daily PRN        03/15/21 1941           Landis Martins 03/15/21 1946    Noemi Chapel, MD 03/22/21 1421

## 2021-03-15 NOTE — ED Provider Notes (Signed)
Patient placed in Quick Look pathway, seen and evaluated   Chief Complaint: right lower abdominal pain  HPI:   Pt reports pain on and off right lower abdomen  Pt reports getting worse  ROS: no fever no chills  Physical Exam:   Gen: No distress  Neuro: Awake and Alert  Skin: Warm    Focused Exam: tender right lower quadrant   Initiation of care has begun. The patient has been counseled on the process, plan, and necessity for staying for the completion/evaluation, and the remainder of the medical screening examination   Sidney Ace 03/15/21 1217    Fredia Sorrow, MD 03/16/21 1718

## 2021-03-20 ENCOUNTER — Ambulatory Visit: Payer: Medicare HMO | Admitting: Gastroenterology

## 2021-03-20 ENCOUNTER — Ambulatory Visit: Payer: Self-pay

## 2021-03-21 ENCOUNTER — Other Ambulatory Visit: Payer: Self-pay

## 2021-03-21 NOTE — Patient Outreach (Signed)
[facility]) Care Management  [place]  03/21/2021   [name] 09/30/1941 [id number]  Subjective: Telephone call to patient for follow up. She reports she has been having some right sided pain.  She states nothing has been found but she will be going to her gyn for possible ovarian cyst.  She is up walking and has been released from the surgeon for her hip.  Blood pressure is doing ok. Patient a little upset as she has not been able to care for her spouse. She states that she has someone who comes in to help them.  Encouraged patient to focus on her wellness in order to return to her previous state. She verbalized understanding.   Objective:   Encounter Medications:  Outpatient Encounter Medications as of 03/21/2021  Medication Sig  . acetaminophen (TYLENOL) 500 MG tablet Take 500 mg by mouth every 6 (six) hours as needed for mild pain or moderate pain.  Marland Kitchen albuterol (VENTOLIN HFA) 108 (90 Base) MCG/ACT inhaler Inhale 2 puffs into the lungs every 6 (six) hours as needed for wheezing or shortness of breath.  . ALPRAZolam (XANAX) 0.5 MG tablet Take 0.5 mg by mouth in the morning, at noon, and at bedtime. (1000, 1500, AT BEDTIME)  . amLODipine (NORVASC) 2.5 MG tablet Take 2.5 mg by mouth every evening.   Marland Kitchen aspirin EC 81 MG tablet Take 81 mg by mouth daily. Swallow whole.  . chlorhexidine (PERIDEX) 0.12 % solution Use as directed 15 mLs in the mouth or throat 2 (two) times daily as needed (mouth ulcers).  . Cholecalciferol (VITAMIN D3) 50 MCG (2000 UT) capsule Take 2,000 Units by mouth daily.  . fluticasone (FLONASE) 50 MCG/ACT nasal spray Place 2 sprays into both nostrils daily as needed for allergies.  . Hyoscyamine Sulfate SL (LEVSIN/SL) 0.125 MG SUBL Place 1 tablet under the tongue 3 (three) times daily as needed (severe abdominal pain).  Marland Kitchen levothyroxine (SYNTHROID) 50 MCG tablet Take 1 tablet (50 mcg total) by mouth daily. (Patient taking differently: Take 50  mcg by mouth daily at 6 (six) AM.)  . losartan (COZAAR) 25 MG tablet Take 25 mg by mouth daily. IN THE MORNING.  . Multiple Vitamin (MULTIVITAMIN WITH MINERALS) TABS tablet Take 1 tablet by mouth daily in the afternoon.  . NONFORMULARY OR COMPOUNDED ITEM Apply 1 application topically 3 (three) times daily as needed (foot neuropathy). neuropathy cream dic 3%/bac 2%/gab %/lid 5%/meth 1%  . pantoprazole (PROTONIX) 40 MG tablet Take 40 mg by mouth 2 (two) times daily.  . polyethylene glycol (MIRALAX / GLYCOLAX) 17 g packet Take 17 g by mouth 2 (two) times daily.  Marland Kitchen PREVIDENT 5000 BOOSTER PLUS 1.1 % PSTE Place 1 application onto teeth at bedtime.  . raloxifene (EVISTA) 60 MG tablet Take 1 tablet (60 mg total) by mouth every morning. (Patient taking differently: Take 60 mg by mouth daily. IN THE MORNING)  . senna (SENOKOT) 8.6 MG TABS tablet Take 1 tablet (8.6 mg total) by mouth in the morning and at bedtime. (Patient taking differently: Take 1 tablet by mouth in the morning and at bedtime. As needed)  . SYMBICORT 80-4.5 MCG/ACT inhaler INHALE 2 PUFFS INTO THE LUNGS TWICE DAILY. (Patient taking differently: Inhale 2 puffs into the lungs daily.)  . Turmeric Curcumin 500 MG CAPS Take 500 mg by mouth daily.  Marland Kitchen venlafaxine XR (EFFEXOR-XR) 37.5 MG 24 hr capsule Take 37.5 mg by mouth daily with breakfast.    No facility-administered encounter  medications on file as of 03/21/2021.    Functional Status:  In your present state of health, do you have any difficulty performing the following activities: 02/05/2021 01/30/2021  Hearing? Y -  Comment wears hearing aids -  Vision? N -  Difficulty concentrating or making decisions? N -  Walking or climbing stairs? Y -  Comment right hip replacement -  Dressing or bathing? N -  Doing errands, shopping? Y N  Comment family helps -  Conservation officer, nature and eating ? Y -  Comment family helps -  Using the Toilet? N -  In the past six months, have you accidently leaked  urine? N -  Do you have problems with loss of bowel control? N -  Managing your Medications? N -  Managing your Finances? N -  Housekeeping or managing your Housekeeping? Y -  Comment family helps -  Some recent data might be hidden    Fall/Depression Screening: Fall Risk  02/05/2021 11/21/2020 05/03/2020  Falls in the past year? 1 0 0  Comment - - -  Number falls in past yr: 0 - -  Comment - - -  Injury with Fall? 1 - -  Comment - - -  Risk for fall due to : History of fall(s) - -  Follow up Falls prevention discussed;Education provided - -   PHQ 2/9 Scores 02/05/2021 11/21/2020 05/03/2020 04/10/2020 08/13/2019  PHQ - 2 Score 0 0 0 0 0  PHQ- 9 Score - - - 3 -    Assessment:  Goals Addressed            This Visit's Progress   . Make and Keep All Appointments   On track    Timeframe:  Long-Range Goal Priority:  High Start Date:  11/21/20                           Expected End Date:   08/01/21            Follow Up Date 05/01/21   - call to cancel if needed    Why is this important?    Part of staying healthy is seeing the doctor for follow-up care.   If you forget your appointments, there are some things you can do to stay on track.    Notes: Patient to make follow up appointment with surgeon.  02/21/21 Patient saw surgeon on 02/19/21. Visit went well.   03/21/21 Patient seeing physician as scheduled    . COMPLETED: [facility] My Quality of Life-recent hip replacement   On track    Timeframe:  Short-Term Goal Priority:  High Start Date:     02/05/21                        Expected End Date:    03/31/21                   Follow Up Date 03/31/21   - discuss my treatment options with the doctor or nurse - do one enjoyable thing every day    Why is this important?    Having a long-term illness can be scary.   It can also be stressful for you and your caregiver.   These steps may help.    Notes: Patient ambulating around the house with walker.Keep up the great work.   02/21/21 Patient working with PT.   03/21/21 Patient up and walking.Doing well with  right hip replacement.      . Track and Manage My Blood Pressure-Hypertension   On track    Timeframe:  Long-Range Goal Priority:  High Start Date:  11/21/20                           Expected End Date:   08/01/21 Follow Up Date 05/01/21   - check blood pressure weekly - write blood pressure results in a log or diary    Why is this important?    You won't feel high blood pressure, but it can still hurt your blood vessels.   High blood pressure can cause heart or kidney problems. It can also cause a stroke.   Making lifestyle changes like losing a little weight or eating less salt will help.   Checking your blood pressure at home and at different times of the day can help to control blood pressure.   If the doctor prescribes medicine remember to take it the way the doctor ordered.   Call the office if you cannot afford the medicine or if there are questions about it.     Notes: 02/21/21 Patient blood pressure less than 140/80. 03/21/21 Most recent blood pressure 144/68       Plan: RN CM will follow up in the month of May. Follow-up:  Patient agrees to Care Plan and Follow-up.   Jone Baseman, RN, MSN [place] Management Care Management Coordinator Direct Line [phone] Cell [phone] Toll Free: [phone]  Fax: [phone]

## 2021-03-26 NOTE — Telephone Encounter (Signed)
Spoke with pt. Pt asked the pharmacy to not fill the Trulance. Pt isn't taking Linzess at this time either at this time. Pt doesn't want to take any prescription pills that make her bowels move at this time.  Pt is taking 1 capful of Miralax and having a BM daily. Pt is going to see her GYN doctor to discuss the pain she feels on her side. Pt mentioned she may have a cyst on her ovaries and will ask if the pain she is feeling is coming from that. Pt states she just needs some answers as to why she is in pain.

## 2021-03-26 NOTE — Telephone Encounter (Signed)
Noted  

## 2021-03-27 ENCOUNTER — Ambulatory Visit: Payer: Medicare HMO | Admitting: Obstetrics & Gynecology

## 2021-03-27 ENCOUNTER — Encounter: Payer: Self-pay | Admitting: Obstetrics & Gynecology

## 2021-03-27 ENCOUNTER — Other Ambulatory Visit: Payer: Self-pay

## 2021-03-27 VITALS — BP 123/79 | HR 46 | Ht 64.0 in | Wt 133.0 lb

## 2021-03-27 DIAGNOSIS — R1031 Right lower quadrant pain: Secondary | ICD-10-CM

## 2021-03-30 ENCOUNTER — Ambulatory Visit: Payer: Medicare HMO | Admitting: Obstetrics & Gynecology

## 2021-03-30 ENCOUNTER — Other Ambulatory Visit: Payer: Self-pay

## 2021-03-30 VITALS — BP 141/81 | HR 94

## 2021-03-30 DIAGNOSIS — G5780 Other specified mononeuropathies of unspecified lower limb: Secondary | ICD-10-CM

## 2021-04-03 ENCOUNTER — Ambulatory Visit: Payer: Medicare HMO | Admitting: Obstetrics & Gynecology

## 2021-04-03 ENCOUNTER — Other Ambulatory Visit: Payer: Self-pay

## 2021-04-03 ENCOUNTER — Encounter: Payer: Self-pay | Admitting: Obstetrics & Gynecology

## 2021-04-03 ENCOUNTER — Encounter: Payer: Self-pay | Admitting: Internal Medicine

## 2021-04-04 ENCOUNTER — Telehealth: Payer: Self-pay | Admitting: Obstetrics & Gynecology

## 2021-04-04 NOTE — Telephone Encounter (Signed)
Pt now recalls that she had been working in the yard and maybe that's why.

## 2021-04-04 NOTE — Telephone Encounter (Signed)
Pt called stating that she had to use her inhaler 3-4 hours after receiving her injection on 04/03/2021, wanted to know what could've caused this

## 2021-04-10 ENCOUNTER — Other Ambulatory Visit: Payer: Self-pay

## 2021-04-10 ENCOUNTER — Ambulatory Visit: Payer: Medicare HMO | Admitting: Obstetrics & Gynecology

## 2021-04-10 ENCOUNTER — Encounter: Payer: Self-pay | Admitting: Obstetrics & Gynecology

## 2021-04-13 ENCOUNTER — Telehealth: Payer: Self-pay

## 2021-04-13 ENCOUNTER — Other Ambulatory Visit: Payer: Self-pay

## 2021-04-13 ENCOUNTER — Encounter: Payer: Self-pay | Admitting: Gastroenterology

## 2021-04-13 ENCOUNTER — Ambulatory Visit: Payer: Medicare HMO | Admitting: Gastroenterology

## 2021-04-13 VITALS — BP 140/90 | HR 81 | Temp 96.6°F | Ht 64.0 in | Wt 135.2 lb

## 2021-04-13 DIAGNOSIS — K259 Gastric ulcer, unspecified as acute or chronic, without hemorrhage or perforation: Secondary | ICD-10-CM

## 2021-04-13 DIAGNOSIS — R1031 Right lower quadrant pain: Secondary | ICD-10-CM | POA: Diagnosis not present

## 2021-04-13 NOTE — Progress Notes (Signed)
Primary Care Physician: [name], MD  Primary Gastroenterologist:  Garfield Cornea, MD   Chief Complaint  Patient presents with  . Abdominal Pain    RLQ, ongoing constant. No n/v. No diarrhea, no constipation. She stopped miralax. Feels bowels are back on track    HPI: [name] is a 80 y.o. female here for follow-up.  Seen back last month with abdominal pain, constipation, vomiting, weight loss.  Colonoscopy and upper endoscopy were completed 12/21/2020. Colonoscopy found diverticulosis, otherwise normal.  Recommended no repeat colonoscopy due to age.  EGD the same day found erosive reflux esophagitis, nonbleeding gastric ulcers with no stigmata of bleeding status post biopsy, normal duodenum.  Surgical pathology found the biopsies to be gastric antral mucosa with evidence of erosion/ulceration, negative for H. pylori.  Recommended repeat EGD in 3 months, start Protonix 40 mg twice daily, avoid NSAIDs.  CT abdomen pelvis February 2022 with subacute impaction of the right subcapital femoral neck, diverticulosis without diverticulitis, absent gallbladder.  Patient was referred to orthopedic.  She had a hip replacement and was told by Ortho that her pain had nothing to do with her hip replacement.  CT abdomen pelvis March 15, 2021 after being referred to the ED from our office for abdominal pain, stable intra and extrahepatic biliary duct dilation likely related to prior cholecystectomy, postsurgical changes seen from interval right hip arthroplasty.  Postoperative seroma anterior to the right hip prosthesis measuring 5.1 x 2.8 x 6.6 cm.  Patient continues to have pain in the RLQ. We have been seeing her for close to one year for RLQ pain. Her symptoms have evolved somewhat. In 01/2021, she called with report of constant pain in RLQ worse with walking. She had fallen 12/18/20 and plain films were normal. CT obtained to evaluate RLQ, done 01/25/21 showed impaction in the right  subcapital femoral neck region, likely subacute. she had hip surgery 02/01/21. She states the current pain she has is different from the pain prior to her hip surgery. This pain came about two weeks after her hip surgery and after starting PT. She states she follow up with ortho who felt her pain was unrelated to her hip. She followed up here in 03/2021. And eventually saw gyn and reports getting injections in the rlq abdominal wall (contains steroids and numbing medication). She states her gyn, Dr. Elonda Husky, is treating her for abdominal wall pain. Shots helped for only few hours. She state gyn exam benign. Pain is daily. Does not hurt if she is still. Worse with walking.  Sleeps well at night. Denies NSAIDs, ASA powders at this time. She admits to ASA powders for one month after surgery but not now.   BMs better this week. Had been on miralax bid, before that would strain and manipulate to get relief. No melena, brbpr. She states gyn reviewed CT with radiology on the speaker phone while she was in his office and advised that she had significant stool load in right lower abdomin. Drank two bottles mag citrate and took miralax at bedtime. Did this for 3 days. Did not help pain.   Current Outpatient Medications  Medication Sig Dispense Refill  . acetaminophen (TYLENOL) 500 MG tablet Take 500 mg by mouth every 6 (six) hours as needed for mild pain or moderate pain.    Marland Kitchen albuterol (VENTOLIN HFA) 108 (90 Base) MCG/ACT inhaler Inhale 2 puffs into the lungs every 6 (six) hours as needed for wheezing or shortness of breath.    Marland Kitchen  ALPRAZolam (XANAX) 0.5 MG tablet Take 0.5 mg by mouth in the morning, at noon, and at bedtime. (1000, 1500, AT BEDTIME)    . amLODipine (NORVASC) 2.5 MG tablet Take 2.5 mg by mouth every evening.     Marland Kitchen aspirin EC 81 MG tablet Take 81 mg by mouth daily. Swallow whole.    . chlorhexidine (PERIDEX) 0.12 % solution Use as directed 15 mLs in the mouth or throat 2 (two) times daily as needed (mouth  ulcers).    . Cholecalciferol (VITAMIN D3) 50 MCG (2000 UT) capsule Take 2,000 Units by mouth daily.    . fluticasone (FLONASE) 50 MCG/ACT nasal spray Place 2 sprays into both nostrils daily as needed for allergies.    Marland Kitchen levothyroxine (SYNTHROID) 50 MCG tablet Take 1 tablet (50 mcg total) by mouth daily. (Patient taking differently: Take 50 mcg by mouth daily at 6 (six) AM.) 90 tablet 3  . losartan (COZAAR) 25 MG tablet Take 25 mg by mouth daily. IN THE MORNING.    . Multiple Vitamin (MULTIVITAMIN WITH MINERALS) TABS tablet Take 1 tablet by mouth daily in the afternoon.    . NONFORMULARY OR COMPOUNDED ITEM Apply 1 application topically 3 (three) times daily as needed (foot neuropathy). neuropathy cream dic 3%/bac 2%/gab %/lid 5%/meth 1%    . pantoprazole (PROTONIX) 40 MG tablet Take 40 mg by mouth 2 (two) times daily.    Marland Kitchen PREVIDENT 5000 BOOSTER PLUS 1.1 % PSTE Place 1 application onto teeth at bedtime.    . raloxifene (EVISTA) 60 MG tablet Take 1 tablet (60 mg total) by mouth every morning. (Patient taking differently: Take 60 mg by mouth daily. IN THE MORNING) 30 tablet 12  . SYMBICORT 80-4.5 MCG/ACT inhaler INHALE 2 PUFFS INTO THE LUNGS TWICE DAILY. 10.2 g 0  . Turmeric Curcumin 500 MG CAPS Take 500 mg by mouth daily.    Marland Kitchen venlafaxine XR (EFFEXOR-XR) 37.5 MG 24 hr capsule Take 37.5 mg by mouth daily with breakfast.      No current facility-administered medications for this visit.    Allergies as of 04/13/2021 - Review Complete 04/13/2021  Allergen Reaction Noted  . Amoxicillin-pot clavulanate Nausea And Vomiting, Other (See Comments), Itching, and Nausea Only 10/04/2013  . Ace inhibitors Cough   . Entex la Itching   . Levofloxacin Hives and Itching 01/21/2017  . Meloxicam Other (See Comments) 04/25/2020    ROS:  General: Negative for anorexia, weight loss, fever, chills, fatigue, weakness. ENT: Negative for hoarseness, difficulty swallowing , nasal congestion. CV: Negative for chest  pain, angina, palpitations, dyspnea on exertion, peripheral edema.  Respiratory: Negative for dyspnea at rest, dyspnea on exertion, cough, sputum, wheezing.  GI: See history of present illness. GU:  Negative for dysuria, hematuria, urinary incontinence, urinary frequency, nocturnal urination.  Endo: Negative for unusual weight change.    Physical Examination:   BP 140/90   Pulse 81   Temp (!) 96.6 F (35.9 C)   Ht 5\' 4"  (1.626 m)   Wt 135 lb 3.2 oz (61.3 kg)   BMI 23.21 kg/m   General: Well-nourished, well-developed in no acute distress.  Eyes: No icterus. Mouth: masked. Lungs: Clear to auscultation bilaterally.  Heart: Regular rate and rhythm, no murmurs rubs or gallops.  Abdomen: Bowel sounds are normal,   nondistended, no hepatosplenomegaly or masses, no abdominal bruits or hernia , no rebound or guarding.  Right mid abdominal tenderness only with leg raise (right) and going from laying to sitting. None with palpation.  Extremities: No lower extremity edema. No clubbing or deformities. Neuro: Alert and oriented x 4   Skin: Warm and dry, no jaundice.   Psych: Alert and cooperative, normal mood and affect.  Labs:  Lab Results  Component Value Date   WBC 9.3 03/15/2021   HGB 11.5 (L) 03/15/2021   HCT 36.5 03/15/2021   MCV 97.9 03/15/2021   PLT 199 03/15/2021   Lab Results  Component Value Date   CREATININE 0.66 03/15/2021   BUN 10 03/15/2021   NA 139 03/15/2021   K 3.3 (L) 03/15/2021   CL 102 03/15/2021   CO2 25 03/15/2021   Lab Results  Component Value Date   ALT 15 03/15/2021   AST 24 03/15/2021   ALKPHOS 69 03/15/2021   BILITOT 0.4 03/15/2021   Lab Results  Component Value Date   LIPASE 23 03/15/2021     Imaging Studies: CT ABDOMEN PELVIS W CONTRAST  Result Date: 03/15/2021 CLINICAL DATA:  Right lower quadrant abdominal pain for 3 weeks EXAM: CT ABDOMEN AND PELVIS WITH CONTRAST TECHNIQUE: Multidetector CT imaging of the abdomen and pelvis was performed  using the standard protocol following bolus administration of intravenous contrast. CONTRAST:  66mL OMNIPAQUE IOHEXOL 300 MG/ML  SOLN COMPARISON:  01/25/2021 FINDINGS: Lower chest: No acute pleural or parenchymal lung disease. Hepatobiliary: No focal liver abnormality is seen. Status post cholecystectomy. Stable intra and extrahepatic biliary duct dilation is likely related to previous cholecystectomy. Pancreas: Unremarkable. No pancreatic ductal dilatation or surrounding inflammatory changes. Spleen: Normal in size without focal abnormality. Adrenals/Urinary Tract: Adrenal glands are unremarkable. Kidneys are normal, without renal calculi, focal lesion, or hydronephrosis. Bladder is unremarkable. Evaluation of the lower pelvis is limited by streak artifact from right hip arthroplasty. Stomach/Bowel: No bowel obstruction or ileus. Scattered diverticulosis of the distal colon without diverticulitis. The appendix is not well visualized and may be surgically absent. No inflammatory changes in the right lower quadrant. No bowel wall thickening. Vascular/Lymphatic: Aortic atherosclerosis. No enlarged abdominal or pelvic lymph nodes. Reproductive: Uterus and bilateral adnexa are unremarkable. Other: No free fluid or free gas.  No abdominal wall hernia. Musculoskeletal: There are no acute or destructive bony lesions. Postsurgical changes are seen from interval right hip arthroplasty. Postoperative seroma anterior to the right hip prosthesis measuring 5.1 x 2.8 x 6.6 cm. Reconstructed images demonstrate no additional findings. IMPRESSION: 1. No acute intra-abdominal or intrapelvic process. 2. Distal colonic diverticulosis without diverticulitis. 3. Postsurgical changes from interval right hip arthroplasty, with postoperative seroma anterior to the right hip. 4.  Aortic Atherosclerosis (ICD10-I70.0). Electronically Signed   By: Randa Ngo M.D.   On: 03/15/2021 18:00     Assessment/Plan:  80 y/o female with h/o  constipation, right sided abdominal pain, vomiting, weight loss discovered to have erosive reflux esophagitis and gastric ulcers back in 12/2020 presenting for follow up.   Gastric ulcers: nonbleeding. Possibly NSAID/ASA related.  No h.pylori. due for surveillance exam.   Vomiting: resolved.  Weight loss: weight has been stable past six months.   Right sided abdominal pain: patient reports this pain began 2 weeks after hip surgery. Unrelated to meals or BMs. Worse with walking. States she discussed with her ortho who felt her symptoms were unrelated to her hip. CTs as outlined. No improvement with better management of constipation. She reports Dr. Elonda Husky is treating her for abdominal wall pain with injections, but relief has only been for a few hours. Most recent CT with ?seroma anterior to the right hip, ?clincal significant.  Will reach out to Dr. Lyla Glassing her orthopedic. She will continue to follow up with Dr. Elonda Husky.    Plan: EGD to follow up on gastric ulcers/verify healing with propofol. ASA II.  I have discussed the risks, alternatives, benefits with regards to but not limited to the risk of reaction to medication, bleeding, infection, perforation and the patient is agreeable to proceed. Written consent to be obtained.  To discuss CT findings with ortho.   Continue to follow with Dr. Elonda Husky.

## 2021-04-13 NOTE — Telephone Encounter (Signed)
Called and informed pt of pre-op/covid test appt 04/20/21 at 1:30pm. Appt letter mailed with procedure instructions.

## 2021-04-13 NOTE — Telephone Encounter (Signed)
Tried to call pt to schedule EGD, no answer.

## 2021-04-13 NOTE — Telephone Encounter (Signed)
Spoke to pt, she is agreeable for Dr. Abbey Chatters to perform ASAP EGD. EGD scheduled for 04/24/21 at 3:00pm. Gave her verbal instructions and will mail instructions after pre-op/covid test is scheduled.

## 2021-04-13 NOTE — Patient Instructions (Addendum)
1. Upper endoscopy as scheduled.  See separate instructions. 2. I will be in touch with you after discussing CT findings with Dr. Lyla Glassing. 3. Continue to follow-up with Dr. Elonda Husky.

## 2021-04-16 NOTE — Telephone Encounter (Signed)
Noted  

## 2021-04-17 ENCOUNTER — Other Ambulatory Visit: Payer: Self-pay

## 2021-04-17 NOTE — Telephone Encounter (Signed)
PA for EGD submitted via [employer] website. [employer]# [id number], valid 04/24/21-05/24/21.

## 2021-04-18 ENCOUNTER — Emergency Department (HOSPITAL_COMMUNITY): Payer: Medicare HMO

## 2021-04-18 ENCOUNTER — Encounter (HOSPITAL_COMMUNITY): Payer: Self-pay | Admitting: Emergency Medicine

## 2021-04-18 ENCOUNTER — Emergency Department (HOSPITAL_COMMUNITY)
Admission: EM | Admit: 2021-04-18 | Discharge: 2021-04-18 | Disposition: A | Discharge status: Hospice | Payer: Medicare HMO | Attending: Emergency Medicine | Admitting: Emergency Medicine

## 2021-04-18 ENCOUNTER — Other Ambulatory Visit: Payer: Self-pay

## 2021-04-18 DIAGNOSIS — Z85828 Personal history of other malignant neoplasm of skin: Secondary | ICD-10-CM | POA: Diagnosis not present

## 2021-04-18 DIAGNOSIS — Z7982 Long term (current) use of aspirin: Secondary | ICD-10-CM | POA: Diagnosis not present

## 2021-04-18 DIAGNOSIS — M25572 Pain in left ankle and joints of left foot: Secondary | ICD-10-CM | POA: Diagnosis not present

## 2021-04-18 DIAGNOSIS — Z96641 Presence of right artificial hip joint: Secondary | ICD-10-CM | POA: Insufficient documentation

## 2021-04-18 DIAGNOSIS — R109 Unspecified abdominal pain: Secondary | ICD-10-CM | POA: Insufficient documentation

## 2021-04-18 DIAGNOSIS — W1839XA Other fall on same level, initial encounter: Secondary | ICD-10-CM | POA: Insufficient documentation

## 2021-04-18 DIAGNOSIS — M25551 Pain in right hip: Secondary | ICD-10-CM | POA: Diagnosis not present

## 2021-04-18 DIAGNOSIS — J45909 Unspecified asthma, uncomplicated: Secondary | ICD-10-CM | POA: Insufficient documentation

## 2021-04-18 DIAGNOSIS — E039 Hypothyroidism, unspecified: Secondary | ICD-10-CM | POA: Diagnosis not present

## 2021-04-18 DIAGNOSIS — Z79899 Other long term (current) drug therapy: Secondary | ICD-10-CM | POA: Diagnosis not present

## 2021-04-18 DIAGNOSIS — W19XXXA Unspecified fall, initial encounter: Secondary | ICD-10-CM | POA: Diagnosis not present

## 2021-04-18 DIAGNOSIS — T84021A Dislocation of internal left hip prosthesis, initial encounter: Secondary | ICD-10-CM | POA: Diagnosis not present

## 2021-04-18 DIAGNOSIS — S73004A Unspecified dislocation of right hip, initial encounter: Secondary | ICD-10-CM

## 2021-04-18 DIAGNOSIS — T84020A Dislocation of internal right hip prosthesis, initial encounter: Secondary | ICD-10-CM | POA: Diagnosis not present

## 2021-04-18 DIAGNOSIS — Z87891 Personal history of nicotine dependence: Secondary | ICD-10-CM | POA: Diagnosis not present

## 2021-04-18 DIAGNOSIS — Z8616 Personal history of COVID-19: Secondary | ICD-10-CM | POA: Diagnosis not present

## 2021-04-18 DIAGNOSIS — Z471 Aftercare following joint replacement surgery: Secondary | ICD-10-CM | POA: Diagnosis not present

## 2021-04-18 MED ORDER — HYDROMORPHONE HCL 1 MG/ML IJ SOLN
1.0000 mg | Freq: Once | INTRAMUSCULAR | Status: AC
  Administered 2021-04-18: 1 mg via INTRAVENOUS
  Filled 2021-04-18: qty 1

## 2021-04-18 MED ORDER — PROPOFOL 10 MG/ML IV BOLUS
INTRAVENOUS | Status: AC | PRN
  Administered 2021-04-18 (×2): 30 mg via INTRAVENOUS

## 2021-04-18 MED ORDER — FENTANYL CITRATE (PF) 100 MCG/2ML IJ SOLN
50.0000 ug | Freq: Once | INTRAMUSCULAR | Status: AC
  Administered 2021-04-18: 50 ug via INTRAVENOUS
  Filled 2021-04-18: qty 2

## 2021-04-18 MED ORDER — PROPOFOL 10 MG/ML IV BOLUS
0.5000 mg/kg | Freq: Once | INTRAVENOUS | Status: AC
  Administered 2021-04-18: 30.5 mg via INTRAVENOUS
  Filled 2021-04-18: qty 20

## 2021-04-18 NOTE — Sedation Documentation (Signed)
Pt responsive at this time. PPV stopped. Spontaneous respirations returned. Pt maintaining O2 at 100%.

## 2021-04-18 NOTE — ED Notes (Signed)
Pt talking with nurse about her daughters job at [employer] and how her husband has dementia.

## 2021-04-18 NOTE — Sedation Documentation (Signed)
Knee immobilizer applied to right knee by Dr Karle Starch.

## 2021-04-18 NOTE — Discharge Instructions (Signed)
Please minimize weight bearing on your R hip until cleared by Dr. Delfino Lovett, use your walker for toe touch weight bearing only. Avoided bending at the hip for the next few days to prevent another dislocation.

## 2021-04-18 NOTE — ED Triage Notes (Signed)
Pt fell in her yard this morning on to her right hip. Pt had a hip replacement x17months ago. Obvious  inward rotation of the right leg. Md at bedside for MSE.

## 2021-04-18 NOTE — ED Provider Notes (Signed)
[facility] EMERGENCY DEPARTMENT Provider Note  CSN: [id number] Arrival date & time: 04/18/21 1120    History Chief Complaint  Patient presents with  . Hip Pain    HPI  [name] is a 80 y.o. female with recent right hip replacement surgery about 2.5 months ago reports she was in her yard picking up sticks today when she got her feet tangled and fell onto her R hip. She is complaining of severe R hip pain, worse with movement. She has also had some abdominal pains in recent weeks managed by Dr. Elonda Husky, she is getting injection but unsure what is being treated.    Past Medical History:  Diagnosis Date  . Anemia   . Anxiety   . Arthritis   . Asthma   . Constipation   . GERD (gastroesophageal reflux disease)   . High blood pressure   . History of COVID-19 12/28/2020  . Hypothyroidism   . PAD (peripheral artery disease) (HCC)    bilateral legs  . Peripheral edema   . Peripheral neuropathy    bilateral feet at night occ  . Psoriasis   . Seasonal allergies   . Skin cancer    Left eye brow  . Varicose veins   . Vertigo 11/23/2013    Past Surgical History:  Procedure Laterality Date  . APPENDECTOMY     patient unsure  . BIOPSY  12/21/2020   Procedure: BIOPSY;  Surgeon: Daneil Dolin, MD;  Location: [facility];  Service: Endoscopy;;  . CATARACT EXTRACTION W/PHACO Left 01/27/2017   Procedure: CATARACT EXTRACTION PHACO AND INTRAOCULAR LENS PLACEMENT ([place]);  Surgeon: Tonny Branch, MD;  Location: [facility] ORS;  Service: Ophthalmology;  Laterality: Left;  CDE: 29.90  . CATARACT EXTRACTION W/PHACO Right 02/24/2017   Procedure: CATARACT EXTRACTION PHACO AND INTRAOCULAR LENS PLACEMENT ([facility]);  Surgeon: Tonny Branch, MD;  Location: [facility];  Service: Ophthalmology;  Laterality: Right;  CDE: 8.57  . CHOLECYSTECTOMY    . COLONOSCOPY N/A 10/01/2016   Procedure: COLONOSCOPY;  Surgeon: Daneil Dolin, MD; pancolonic diverticulosis, melanosis coli, otherwise normal exam.  No  recommendations to repeat colonoscopy.  . COLONOSCOPY WITH PROPOFOL N/A 12/21/2020   Procedure: COLONOSCOPY WITH PROPOFOL;  Surgeon: Daneil Dolin, MD;  [facility]: [facility];  Service: Endoscopy;  Laterality: N/A;  10:30am  . ESOPHAGOGASTRODUODENOSCOPY (EGD) WITH PROPOFOL N/A 12/21/2020   Procedure: ESOPHAGOGASTRODUODENOSCOPY (EGD) WITH PROPOFOL;  Surgeon: Daneil Dolin, MD;  Location[facility];  Service: Endoscopy;  Laterality: N/A;  . FLEXOR TENDON REPAIR Right 11/06/2017   Procedure: RIGHT WRIST FLEXOR TENDON REPAIRAND STT [place];  Surgeon: Leanora Cover, MD;  Location: [place];  Service: Orthopedics;  Laterality: Right;  . MOUTH SURGERY     artery bleed  . TOTAL HIP ARTHROPLASTY Right 01/31/2021   Procedure: TOTAL HIP ARTHROPLASTY ANTERIOR APPROACH;  Surgeon: Rod Can, MD;  Location: [facility];  Service: Orthopedics;  Laterality: Right;  . TUBAL LIGATION      Family History  Problem Relation Age of Onset  . Hypertension Mother   . Varicose Veins Father   . Heart attack Father   . Heart disease Father   . Hypertension Brother   . COPD Brother     Social History   Tobacco Use  . Smoking status: Former Smoker    Packs/day: 0.25    Years: 5.00    Pack years: 1.25    Types: Cigarettes    Quit date: 12/02/1997  Years since quitting: 23.3  . Smokeless tobacco: Never Used  Vaping Use  . Vaping Use: Never used  Substance Use Topics  . Alcohol use: Yes    Alcohol/week: 1.0 standard drink    Types: 1 Glasses of wine per week    Comment: occasional wine  . Drug use: No     Home Medications Prior to Admission medications   Medication Sig Start Date End Date Taking? Authorizing Provider  acetaminophen (TYLENOL) 500 MG tablet Take 1,000 mg by mouth every 6 (six) hours as needed for mild pain or moderate pain.    [provider]  albuterol (VENTOLIN HFA) 108 (90 Base) MCG/ACT inhaler Inhale 2 puffs into the lungs every 6 (six) hours as  needed for wheezing or shortness of breath.    [provider]  ALPRAZolam Prudy Feeler) 0.5 MG tablet Take 0.5 mg by mouth in the morning, at noon, and at bedtime. (1000, 1500, AT BEDTIME)    [provider]  amLODipine (NORVASC) 2.5 MG tablet Take 2.5 mg by mouth every evening.  02/09/13   [provider]  aspirin EC 81 MG tablet Take 81 mg by mouth daily. Swallow whole.    [provider]  chlorhexidine (PERIDEX) 0.12 % solution Use as directed 15 mLs in the mouth or throat 2 (two) times daily as needed (mouth ulcers).    [provider]  Cholecalciferol (VITAMIN D3) 50 MCG (2000 UT) capsule Take 2,000 Units by mouth daily.    [provider]  diclofenac Sodium (VOLTAREN) 1 % GEL Apply 1 application topically 4 (four) times daily as needed (back pain).    [provider]  fluticasone (FLONASE) 50 MCG/ACT nasal spray Place 2 sprays into both nostrils daily as needed for allergies.    Benita Stabile, MD  levothyroxine (SYNTHROID) 50 MCG tablet Take 1 tablet (50 mcg total) by mouth daily. Patient taking differently: Take 50 mcg by mouth daily before breakfast. 08/02/20   Dani Gobble, NP  losartan (COZAAR) 25 MG tablet Take 25 mg by mouth daily. IN THE MORNING. 07/14/20   [provider]  Multiple Vitamin (MULTIVITAMIN WITH MINERALS) TABS tablet Take 1 tablet by mouth daily in the afternoon.    [provider]  NONFORMULARY OR COMPOUNDED ITEM Apply 1 application topically 3 (three) times daily as needed (foot neuropathy). neuropathy cream dic 3%/bac 2%/gab %/lid 5%/meth 1%    [provider]  pantoprazole (PROTONIX) 40 MG tablet Take 40 mg by mouth 2 (two) times daily. 01/17/21   [provider]  PREVIDENT 5000 BOOSTER PLUS 1.1 % PSTE Place 1 application onto teeth every other day. 09/28/20   [provider]  raloxifene (EVISTA) 60 MG tablet Take 1 tablet (60 mg total) by mouth every morning. Patient  taking differently: Take 60 mg by mouth every other day. IN THE MORNING 04/10/20   Adline Potter, NP  SYMBICORT 80-4.5 MCG/ACT inhaler INHALE 2 PUFFS INTO THE LUNGS TWICE DAILY. Patient taking differently: Inhale 2 puffs into the lungs daily. 02/02/18   Nyoka Cowden, MD  Turmeric Curcumin 500 MG CAPS Take 500 mg by mouth daily.    [provider]  venlafaxine XR (EFFEXOR-XR) 37.5 MG 24 hr capsule Take 37.5 mg by mouth daily with breakfast.  07/10/20   [provider]     Allergies    Amoxicillin-pot clavulanate, Ace inhibitors, Entex la, Levofloxacin, and Meloxicam   Review of Systems   Review of Systems A comprehensive  review of systems was completed and negative except as noted in HPI.    Physical Exam BP (!) 189/79   Pulse 76   Temp 98 F (36.7 C) (Oral)   Resp (!) 22   Ht 5\' 4"  (1.626 m)   Wt 61 kg   SpO2 100%   BMI 23.08 kg/m   Physical Exam Vitals and nursing note reviewed.  Constitutional:      Appearance: Normal appearance.  HENT:     Head: Normocephalic and atraumatic.     Nose: Nose normal.     Mouth/Throat:     Mouth: Mucous membranes are moist.  Eyes:     Extraocular Movements: Extraocular movements intact.     Conjunctiva/sclera: Conjunctivae normal.  Cardiovascular:     Rate and Rhythm: Normal rate.  Pulmonary:     Effort: Pulmonary effort is normal.     Breath sounds: Normal breath sounds.  Abdominal:     General: Abdomen is flat.     Palpations: Abdomen is soft.     Tenderness: There is no abdominal tenderness.  Musculoskeletal:        General: No swelling.     Cervical back: Neck supple.     Comments: R leg is internally rotated, distally NVI, unable to ROM due to pain  Skin:    General: Skin is warm and dry.  Neurological:     General: No focal deficit present.     Mental Status: She is alert.  Psychiatric:        Mood and Affect: Mood normal.      ED Results / Procedures / Treatments   Labs (all labs ordered  are listed, but only abnormal results are displayed) Labs Reviewed - No data to display  EKG None  Radiology DG Hip Unilat With Pelvis 2-3 Views Right  Result Date: 04/18/2021 CLINICAL DATA:  Post reduction EXAM: DG HIP (WITH OR WITHOUT PELVIS) 2-3V RIGHT COMPARISON:  Same day radiograph FINDINGS: There has been interval reduction of the right hip. The femoral component is not well seated within the acetabular cup. There is no evidence of fracture. IMPRESSION: Interval reduction of the right hip arthroplasty with normal alignment and no evidence of fracture Electronically Signed   By: Maurine Simmering   On: 04/18/2021 15:38   DG Hip Unilat With Pelvis 2-3 Views Right  Result Date: 04/18/2021 CLINICAL DATA:  Fall EXAM: DG HIP (WITH OR WITHOUT PELVIS) 2-3V RIGHT COMPARISON:  January 31, 2021 FINDINGS: Frontal pelvis as well as frontal and lateral right hip images were obtained. There is a total hip replacement on the right. The femoral component of the prosthesis is displaced laterally and posteriorly with respect to the acetabular component. No fracture or dislocation. There is moderate narrowing of the left hip joint. 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 No acute fracture. Narrowing left hip joint noted. Electronically Signed   By: Lowella Grip III M.D.   On: 04/18/2021 13:17    Procedures .Sedation  Date/Time: 04/18/2021 2:35 PM Performed by: Truddie Hidden, MD Authorized by: Truddie Hidden, MD   Consent:    Consent obtained:  Verbal and written   Consent given by:  Patient and guardian   Risks discussed:  Allergic reaction, dysrhythmia, prolonged hypoxia resulting in organ damage, prolonged sedation necessitating reversal, respiratory compromise necessitating ventilatory assistance and intubation and vomiting   Alternatives discussed:  Analgesia without sedation Universal protocol:  Immediately prior to procedure, a time out was called: yes   Pre-sedation assessment:    Time since last food or drink:  4   ASA classification: class 2 - patient with mild systemic disease     Mallampati score:  I - soft palate, uvula, fauces, pillars visible   Pre-sedation assessments completed and reviewed: airway patency and mental status     Pre-sedation assessment completed:  04/18/2021 2:00 PM Immediate pre-procedure details:    Reassessment: Patient reassessed immediately prior to procedure     Reviewed: vital signs and NPO status     Verified: bag valve mask available, emergency equipment available, intubation equipment available, IV patency confirmed and oxygen available   Procedure details (see MAR for exact dosages):    Preoxygenation:  Nasal cannula   Sedation:  Propofol   Intended level of sedation: deep   Analgesia:  Hydromorphone   Intra-procedure monitoring:  Blood pressure monitoring, cardiac monitor, continuous capnometry, continuous pulse oximetry, frequent LOC assessments and frequent vital sign checks   Intra-procedure events: hypoxia     Intra-procedure management:  Airway repositioning, BVM ventilation and supplemental oxygen   Total Provider sedation time (minutes):  15 Post-procedure details:    Post-sedation assessment completed:  04/18/2021 2:37 PM   Attendance: Constant attendance by certified staff until patient recovered     Post-sedation assessments completed and reviewed: mental status     Procedure completion:  Tolerated well, no immediate complications .Ortho Injury Treatment  Date/Time: 04/18/2021 2:37 PM Performed by: Truddie Hidden, MD Authorized by: Truddie Hidden, MD   Consent:    Consent obtained:  Written and verbal   Consent given by:  Patient and guardian   Risks discussed:  Irreducible dislocationInjury location: hip Location details: right hip Injury type: dislocation Dislocation type: posterior Spontaneous dislocation:  no Prosthesis: yes Pre-procedure neurovascular assessment: neurovascularly intact Manipulation performed: yes Reduction method: traction and counter traction and external rotation Reduction successful: yes X-ray confirmed reduction: yes Immobilization: knee immobilizer. Splint Applied by: ED Provider Post-procedure neurovascular assessment: post-procedure neurovascularly intact     Medications Ordered in the ED Medications  fentaNYL (SUBLIMAZE) injection 50 mcg (50 mcg Intravenous Given 04/18/21 1148)  HYDROmorphone (DILAUDID) injection 1 mg (1 mg Intravenous Given 04/18/21 1315)  propofol (DIPRIVAN) 10 mg/mL bolus/IV push 30.5 mg (30.5 mg Intravenous Given 04/18/21 1425)  propofol (DIPRIVAN) 10 mg/mL bolus/IV push (30 mg Intravenous Given 04/18/21 1425)     MDM Rules/Calculators/A&P MDM Concern for prosthetic hip dislocation. Will give pain medications and check xray. ED Course  I have reviewed the triage vital signs and the nursing notes.  Pertinent labs & imaging results that were available during my care of the patient were reviewed by me and considered in my medical decision making (see chart for details).  Clinical Course as of 04/18/21 1543  Wed Apr 18, 2021  1325 Spoke with Dr. Delfino Lovett, Ortho, who did the patient's surgery. He has requested we attempt reduction in the ED. She last drank a coffee around 10am, last ate breakfast at 8am.  [CS]  40 Daughter who is POA is at bedside. Discussed risks and benefits of procedural sedation and reduction. Patient and daughter are amenable.  [CS]  0865 Post reduction films look successful, awaiting formal rads read. Patient reports pain resolved. She is feeling better. Recommend she use her walker at home for minimal weight bearing until she can follow up with her Ortho doctor. RTED for any other concerns.  [CS]    Clinical  Course User Index [CS] Truddie Hidden, MD    Final Clinical Impression(s) / ED Diagnoses Final  diagnoses:  Closed dislocation of right hip, initial encounter [facility])    Rx / DC Orders ED Discharge Orders    None       Truddie Hidden, MD 04/18/21 [phone]

## 2021-04-18 NOTE — ED Notes (Signed)
ED Provider at bedside. 

## 2021-04-18 NOTE — Sedation Documentation (Signed)
Pt apneic at this time. PPV initiated by respiratory. O2 sats dropped to 72%.

## 2021-04-19 ENCOUNTER — Telehealth: Payer: Self-pay

## 2021-04-19 ENCOUNTER — Ambulatory Visit: Payer: Medicare HMO | Admitting: Obstetrics & Gynecology

## 2021-04-19 NOTE — Patient Instructions (Signed)
[name]  04/19/2021     @PREFPERIOPPHARMACY @   Your procedure is scheduled on  04/24/2021   Report to [facility] at  1300 (1:00)  P.M.   Call this number if you have problems the morning of surgery:  [phone]   Remember:  Follow the diet instructions given to you by the office.                     Take these medicines the morning of surgery with A SIP OF WATER  Xanax(if needed), levothyroxine, protonix, effexor.     Please brush your teeth.  Do not wear jewelry, make-up or nail polish.  Do not wear lotions, powders, or perfumes, or deodorant.  Do not shave 48 hours prior to surgery.  Men may shave face and neck.  Do not bring valuables to the hospital.  [facility] is not responsible for any belongings or valuables.  Contacts, dentures or bridgework may not be worn into surgery.  Leave your suitcase in the car.  After surgery it may be brought to your room.  For patients admitted to the hospital, discharge time will be determined by your treatment team.  Patients discharged the day of surgery will not be allowed to drive home and must have someone with them for 24 hours.    Special instructions:  DO NOT smoke tobacco or vape for 24 hours before your procedure.  Please read over the following fact sheets that you were given. Anesthesia Post-op Instructions and Care and Recovery After Surgery      Monitored Anesthesia Care, Care After This sheet gives you information about how to care for yourself after your procedure. Your health care provider may also give you more specific instructions. If you have problems or questions, contact your health care provider. What can I expect after the procedure? After the procedure, it is common to have:  Tiredness.  Forgetfulness about what happened after the procedure.  Impaired judgment for important decisions.  Nausea or vomiting.  Some difficulty with balance. Follow these instructions at home: For the  time period you were told by your health care provider:  Rest as needed.  Do not participate in activities where you could fall or become injured.  Do not drive or use machinery.  Do not drink alcohol.  Do not take sleeping pills or medicines that cause drowsiness.  Do not make important decisions or sign legal documents.  Do not take care of children on your own.      Eating and drinking  Follow the diet that is recommended by your health care provider.  Drink enough fluid to keep your urine pale yellow.  If you vomit: ? Drink water, juice, or soup when you can drink without vomiting. ? Make sure you have little or no nausea before eating solid foods. General instructions  Have a responsible adult stay with you for the time you are told. It is important to have someone help care for you until you are awake and alert.  Take over-the-counter and prescription medicines only as told by your health care provider.  If you have sleep apnea, surgery and certain medicines can increase your risk for breathing problems. Follow instructions from your health care provider about wearing your sleep device: ? Anytime you are sleeping, including during daytime naps. ? While taking prescription pain medicines, sleeping medicines, or medicines that make you drowsy.  Avoid smoking.  Keep all follow-up visits as  told by your health care provider. This is important. Contact a health care provider if:  You keep feeling nauseous or you keep vomiting.  You feel light-headed.  You are still sleepy or having trouble with balance after 24 hours.  You develop a rash.  You have a fever.  You have redness or swelling around the IV site. Get help right away if:  You have trouble breathing.  You have new-onset confusion at home. Summary  For several hours after your procedure, you may feel tired. You may also be forgetful and have poor judgment.  Have a responsible adult stay with you  for the time you are told. It is important to have someone help care for you until you are awake and alert.  Rest as told. Do not drive or operate machinery. Do not drink alcohol or take sleeping pills.  Get help right away if you have trouble breathing, or if you suddenly become confused. This information is not intended to replace advice given to you by your health care provider. Make sure you discuss any questions you have with your health care provider. Document Revised: 08/03/2020 Document Reviewed: 10/21/2019 Elsevier Patient Education  2021 [employer].

## 2021-04-19 NOTE — Telephone Encounter (Signed)
FYI:  Pt phoned and advised yesterday she fell and broke her hip and therefore has to cancel her EGD. She has already cancelled her covid & preop test. She will not see her surgeon until June 6th. She states that you were so concerned about her that she wanted you know and that the EGD will have to be put off awhile later.

## 2021-04-20 ENCOUNTER — Encounter (HOSPITAL_COMMUNITY): Payer: Self-pay

## 2021-04-20 ENCOUNTER — Encounter (HOSPITAL_COMMUNITY)
Admission: RE | Admit: 2021-04-20 | Discharge: 2021-04-20 | Disposition: A | Discharge status: Home / Self Care | Payer: Medicare HMO | Source: Ambulatory Visit | Attending: Internal Medicine | Admitting: Internal Medicine

## 2021-04-20 ENCOUNTER — Other Ambulatory Visit (HOSPITAL_COMMUNITY): Payer: Medicare HMO

## 2021-04-23 ENCOUNTER — Telehealth: Payer: Self-pay | Admitting: *Deleted

## 2021-04-23 NOTE — Telephone Encounter (Signed)
noted 

## 2021-04-23 NOTE — Telephone Encounter (Signed)
Spoke to patient. Currently non-weight bearing on right hip pending follow up with her surgeon on 6/6. EGD cancelled for now.   Currently very little right hip pain since hip reduced in ED under anesthesia last week. Also notes improvement in her right mid abd pain. Some days no abdominal pain, some days very little.   Advised patient she can continue on pantoprazole BID until we get her rescheduled for EGD. She will call once she has seen ortho and has been cleared. If it's going to be awhile, she could consider reducing her PPI to daily.   She will call when ready to schedule EGD and if she has any questions/concerns.

## 2021-04-23 NOTE — Telephone Encounter (Signed)
Received call from pt.  She took at fall on Wed last week.  Had hip replacement back in March.  Pt says that she had recent surgery again on Wed afternoon (04/18/2021).  Pt said "they put the ball back in socket".  Pt can not put any weight on her leg and is using a walker.  Wants to put EGD off for now.  Pt says she goes back to see surgeon on 05/07/2021.  Pt called hospital and cancelled Covid test and Pre-op.  She is going to get clearance from surgeon before proceeding with EGD.    Routing to Neil Crouch, PA and [facility] Clinical Pool.

## 2021-04-23 NOTE — Telephone Encounter (Signed)
Noted  

## 2021-04-24 ENCOUNTER — Ambulatory Visit (HOSPITAL_COMMUNITY): Admission: RE | Admit: 2021-04-24 | Payer: Medicare HMO | Source: Home / Self Care

## 2021-04-24 ENCOUNTER — Encounter (HOSPITAL_COMMUNITY): Admission: RE | Payer: Self-pay | Source: Home / Self Care

## 2021-04-24 SURGERY — ESOPHAGOGASTRODUODENOSCOPY (EGD) WITH PROPOFOL
Anesthesia: Monitor Anesthesia Care

## 2021-04-26 ENCOUNTER — Other Ambulatory Visit: Payer: Self-pay

## 2021-04-26 NOTE — Patient Instructions (Addendum)
Goals Addressed            This Visit's Progress   . Make and Keep All Appointments   On track    Timeframe:  Long-Range Goal Priority:  High Start Date:  11/21/20                           Expected End Date:   08/01/21            Follow Up Date 05/31/21  - ask family or friend for a ride    Why is this important?    Part of staying healthy is seeing the doctor for follow-up care.   If you forget your appointments, there are some things you can do to stay on track.    Notes: Patient to make follow up appointment with surgeon.  02/21/21 Patient saw surgeon on 02/19/21. Visit went well.   03/21/21 Patient seeing physician as scheduled 04/26/21 No transportation problems.    . Prevent Falls and Injury       Timeframe:  Short-Term Goal Priority:  High Start Date:     04/26/21                        Expected End Date: 05/31/21                      Follow Up Date 05/31/21   - learn how to get back up if I fall - make an emergency alert plan in case I fall - use a nonslip pad with throw rugs, or remove them completely - use a cane or walker    Why is this important?    Most falls happen when it is hard for you to walk safely. Your balance may be off because of an illness. You may have pain in your knees, hip or other joints.   You may be overly tired or taking medicines that make you sleepy. You may not be able to see or hear clearly.   Falls can lead to broken bones, bruises or other injuries.   There are things you can do to help prevent falling.     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  Patient limited weight bearing to see surgeon 05/07/21.     . Track and Manage My Blood Pressure-Hypertension   On track    Timeframe:  Long-Range Goal Priority:  High Start Date:  11/21/20                           Expected End Date:   08/01/21 Follow Up Date 05/31/21   - check blood pressure weekly - write blood pressure results in a log or diary    Why is this  important?    You won't feel high blood pressure, but it can still hurt your blood vessels.   High blood pressure can cause heart or kidney problems. It can also cause a stroke.   Making lifestyle changes like losing a little weight or eating less salt will help.   Checking your blood pressure at home and at different times of the day can help to control blood pressure.   If the doctor prescribes medicine remember to take it the way the doctor ordered.   Call the office if you cannot afford the medicine or if there are questions about it.  Notes: 02/21/21 Patient blood pressure less than 140/80. 03/21/21 Most recent blood pressure 144/68 04/26/21 Continue to monitor blood pressure.       Understanding Your Risk for Falls Each year, millions of people have serious injuries from falls. It is important to understand your risk for falling. Talk with your health care provider about your risk and what you can do to lower it. There are actions you can take at home to lower your risk and prevent falls. If you do have a serious fall, make sure to tell your health care provider. Falling once raises your risk of falling again. How can falls affect me? Serious injuries from falls are common. These include:  Broken bones, such as hip fractures.  Head injuries, such as traumatic brain injuries (TBI) or concussion. A fear of falling can cause you to avoid activities and stay at home. This can make your muscles weaker and actually raise your risk for a fall. What can increase my risk? There are a number of risk factors that increase your risk for falling. The more risk factors you have, the higher your risk of falling. Serious injuries from a fall happen most often to people older than age 76. Children and young adults ages 56-29 are also at higher risk. Common risk factors include:  Weakness in the lower body.  Lack (deficiency) of vitamin D.  Being generally weak or confused due to long-term  (chronic) illness.  Dizziness or balance problems.  Poor vision.  Medicines that cause dizziness or drowsiness. These can include medicines for your blood pressure, heart, anxiety, insomnia, or edema, as well as pain medicines and muscle relaxants. Other risk factors include:  Drinking alcohol.  Having had a fall in the past.  Having depression.  Having foot pain or wearing improper footwear.  Working at a dangerous job.  Having any of the following in your home: ? Tripping hazards, such as floor clutter or loose rugs. ? Poor lighting. ? Pets.  Having dementia or memory loss. What actions can I take to lower my risk of falling? Physical activity Maintain physical fitness. Do strength and balance exercises. Consider taking a regular class to build strength and balance. Yoga and tai chi are good options. Vision Have your eyes checked every year and your vision prescription updated as needed. Walking aids and footwear  Wear nonskid shoes. Do not wear high heels.  Do not walk around the house in socks or slippers.  Use a cane or walker as told by your health care provider. Home safety  Attach secure railings on both sides of your stairs.  Install grab bars for your tub, shower, and toilet. Use a bath mat in your tub or shower.  Use good lighting in all rooms. Keep a flashlight near your bed.  Make sure there is a clear path from your bed to the bathroom. Use night-lights.  Do not use throw rugs. Make sure all carpeting is taped or tacked down securely.  Remove all clutter from walkways and stairways, including extension cords.  Repair uneven or broken steps.  Avoid walking on icy or slippery surfaces. Walk on the grass instead of on icy or slick sidewalks. Use ice melt to get rid of ice on walkways.  Use a cordless phone.      Questions to ask your health care provider  Can you help me check my risk for a fall?  Do any of my medicines make me more likely to  fall?  Should I  take a vitamin D supplement?  What exercises can I do to improve my strength and balance?  Should I make an appointment to have my vision checked?  Do I need a bone density test to check for weak bones or osteoporosis?  Would it help to use a cane or a walker? Where to find more information  Centers for Disease Control and Prevention, STEADI: http://www.wolf.info/  Community-Based Fall Prevention Programs: http://www.wolf.info/  National Institute on Aging: http://kim-miller.com/ Contact a health care provider if:  You fall at home.  You are afraid of falling at home.  You feel weak, drowsy, or dizzy. Summary  Serious injuries from a fall happen most often to people older than age 83. Children and young adults ages 76-29 are also at higher risk.  Talk with your health care provider about your risks for falling and how to lower those risks.  Taking certain precautions at home can lower your risk for falling.  If you fall, always tell your health care provider. This information is not intended to replace advice given to you by your health care provider. Make sure you discuss any questions you have with your health care provider. Document Revised: 06/21/2020 Document Reviewed: 06/21/2020 Elsevier Patient Education  [place].

## 2021-04-26 NOTE — Patient Outreach (Addendum)
[facility]) Care Management  [place]  04/26/2021   [name] September 17, 1941 [id number]  Subjective: Telephone call to patient for follow up. She reports having a fall and dislocated her right hip.  She reports that she is limited weightbearing and using her walker for ambulation.  Discussed fall precautions and not walking in the yard as surface is uneven.  She verbalized understanding. She states they still have help in the home and that she is basically not doing a lot right now.  Encouraged her to accept the help.  She verbalized understanding.    Objective:   Encounter Medications:  Outpatient Encounter Medications as of 04/26/2021  Medication Sig  . acetaminophen (TYLENOL) 500 MG tablet Take 1,000 mg by mouth every 6 (six) hours as needed for mild pain or moderate pain.  Marland Kitchen albuterol (VENTOLIN HFA) 108 (90 Base) MCG/ACT inhaler Inhale 2 puffs into the lungs every 6 (six) hours as needed for wheezing or shortness of breath.  . ALPRAZolam (XANAX) 0.5 MG tablet Take 0.5 mg by mouth in the morning, at noon, and at bedtime. (1000, 1500, AT BEDTIME)  . amLODipine (NORVASC) 2.5 MG tablet Take 2.5 mg by mouth every evening.   Marland Kitchen aspirin EC 81 MG tablet Take 81 mg by mouth daily. Swallow whole.  . chlorhexidine (PERIDEX) 0.12 % solution Use as directed 15 mLs in the mouth or throat 2 (two) times daily as needed (mouth ulcers).  . Cholecalciferol (VITAMIN D3) 50 MCG (2000 UT) capsule Take 2,000 Units by mouth daily.  . diclofenac Sodium (VOLTAREN) 1 % GEL Apply 1 application topically 4 (four) times daily as needed (back pain).  . fluticasone (FLONASE) 50 MCG/ACT nasal spray Place 2 sprays into both nostrils daily as needed for allergies.  Marland Kitchen levothyroxine (SYNTHROID) 50 MCG tablet Take 1 tablet (50 mcg total) by mouth daily. (Patient taking differently: Take 50 mcg by mouth daily before breakfast.)  . losartan (COZAAR) 25 MG tablet Take 25 mg by mouth daily. IN THE  MORNING.  . Multiple Vitamin (MULTIVITAMIN WITH MINERALS) TABS tablet Take 1 tablet by mouth daily in the afternoon.  . NONFORMULARY OR COMPOUNDED ITEM Apply 1 application topically 3 (three) times daily as needed (foot neuropathy). neuropathy cream dic 3%/bac 2%/gab %/lid 5%/meth 1%  . pantoprazole (PROTONIX) 40 MG tablet Take 40 mg by mouth 2 (two) times daily.  Marland Kitchen PREVIDENT 5000 BOOSTER PLUS 1.1 % PSTE Place 1 application onto teeth every other day.  . raloxifene (EVISTA) 60 MG tablet Take 1 tablet (60 mg total) by mouth every morning. (Patient taking differently: Take 60 mg by mouth every other day. IN THE MORNING)  . SYMBICORT 80-4.5 MCG/ACT inhaler INHALE 2 PUFFS INTO THE LUNGS TWICE DAILY. (Patient taking differently: Inhale 2 puffs into the lungs daily.)  . Turmeric Curcumin 500 MG CAPS Take 500 mg by mouth daily.  Marland Kitchen venlafaxine XR (EFFEXOR-XR) 37.5 MG 24 hr capsule Take 37.5 mg by mouth daily with breakfast.    No facility-administered encounter medications on file as of 04/26/2021.    Functional Status:  In your present state of health, do you have any difficulty performing the following activities: 02/05/2021 01/30/2021  Hearing? Y -  Comment wears hearing aids -  Vision? N -  Difficulty concentrating or making decisions? N -  Walking or climbing stairs? Y -  Comment right hip replacement -  Dressing or bathing? N -  Doing errands, shopping? Y N  Comment family helps -  Preparing Food and eating ? Y -  Comment family helps -  Using the Toilet? N -  In the past six months, have you accidently leaked urine? N -  Do you have problems with loss of bowel control? N -  Managing your Medications? N -  Managing your Finances? N -  Housekeeping or managing your Housekeeping? Y -  Comment family helps -  Some recent data might be hidden    Fall/Depression Screening: Fall Risk  02/05/2021 11/21/2020 05/03/2020  Falls in the past year? 1 0 0  Comment - - -  Number falls in past yr: 0 - -   Comment - - -  Injury with Fall? 1 - -  Comment - - -  Risk for fall due to : History of fall(s) - -  Follow up Falls prevention discussed;Education provided - -   PHQ 2/9 Scores 02/05/2021 11/21/2020 05/03/2020 04/10/2020 08/13/2019  PHQ - 2 Score 0 0 0 0 0  PHQ- 9 Score - - - 3 -    Assessment:  Goals Addressed            This Visit's Progress   . Make and Keep All Appointments   On track    Barriers: None Timeframe:  Long-Range Goal Priority:  High Start Date:  11/21/20                           Expected End Date:   08/01/21            Follow Up Date 05/31/21  - ask family or friend for a ride    Why is this important?    Part of staying healthy is seeing the doctor for follow-up care.   If you forget your appointments, there are some things you can do to stay on track.    Notes: Patient to make follow up appointment with surgeon.  02/21/21 Patient saw surgeon on 02/19/21. Visit went well.   03/21/21 Patient seeing physician as scheduled 04/26/21 No transportation problems.    . Prevent Falls and Injury       Barriers: Knowledge Timeframe:  Short-Term Goal Priority:  High Start Date:     04/26/21                        Expected End Date: 05/31/21                      Follow Up Date 05/31/21   - learn how to get back up if I fall - make an emergency alert plan in case I fall - use a nonslip pad with throw rugs, or remove them completely - use a cane or walker    Why is this important?    Most falls happen when it is hard for you to walk safely. Your balance may be off because of an illness. You may have pain in your knees, hip or other joints.   You may be overly tired or taking medicines that make you sleepy. You may not be able to see or hear clearly.   Falls can lead to broken bones, bruises or other injuries.   There are things you can do to help prevent falling.     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  Patient limited weight bearing to  see surgeon 05/07/21.     . Track and Manage My Blood Pressure-Hypertension  On track    Barriers: Knowledge Timeframe:  Long-Range Goal Priority:  High Start Date:  11/21/20                           Expected End Date:   08/01/21 Follow Up Date 05/31/21   - check blood pressure weekly - write blood pressure results in a log or diary    Why is this important?    You won't feel high blood pressure, but it can still hurt your blood vessels.   High blood pressure can cause heart or kidney problems. It can also cause a stroke.   Making lifestyle changes like losing a little weight or eating less salt will help.   Checking your blood pressure at home and at different times of the day can help to control blood pressure.   If the doctor prescribes medicine remember to take it the way the doctor ordered.   Call the office if you cannot afford the medicine or if there are questions about it.     Notes: 02/21/21 Patient blood pressure less than 140/80. 03/21/21 Most recent blood pressure 144/68 04/26/21 Continue to monitor blood pressure.        Plan: RN CM will outreach next month.   Follow-up:  Patient agrees to Care Plan and Follow-up.   Jone Baseman, RN, MSN [facility] Care Management Coordinator Direct Line [phone] Cell [phone] Toll Free: [phone]  Fax: [phone]

## 2021-05-07 DIAGNOSIS — S72024A Nondisplaced fracture of epiphysis (separation) (upper) of right femur, initial encounter for closed fracture: Secondary | ICD-10-CM | POA: Diagnosis not present

## 2021-05-14 ENCOUNTER — Ambulatory Visit: Payer: Medicare HMO | Admitting: Obstetrics & Gynecology

## 2021-05-14 ENCOUNTER — Telehealth: Payer: Self-pay | Admitting: Internal Medicine

## 2021-05-14 ENCOUNTER — Encounter: Payer: Self-pay | Admitting: Obstetrics & Gynecology

## 2021-05-14 ENCOUNTER — Other Ambulatory Visit: Payer: Self-pay

## 2021-05-14 DIAGNOSIS — E039 Hypothyroidism, unspecified: Secondary | ICD-10-CM | POA: Diagnosis not present

## 2021-05-14 NOTE — Telephone Encounter (Signed)
[phone]   patient called to schedule her endo, said her doc released her from her hip surgery

## 2021-05-14 NOTE — Telephone Encounter (Signed)
[name], please advise if we can proceed with rescheduling patient's EGD? thanks

## 2021-05-15 LAB — TSH: TSH: 2.04 u[IU]/mL (ref 0.450–4.500)

## 2021-05-15 LAB — T4, FREE: Free T4: 1.36 ng/dL (ref 0.82–1.77)

## 2021-05-16 ENCOUNTER — Ambulatory Visit: Payer: Medicare HMO | Admitting: Nurse Practitioner

## 2021-05-16 NOTE — Telephone Encounter (Addendum)
Called pt. She is not on any blood thinners. Patient scheduled for EGD with Dr. Abbey Chatters, ASA 2 asap 6/27 at 12:30pm. Aware will mail prep instructions to her and also discussed in detail.   PA submitted via [employer]. Pending status. Tracking # [id number]

## 2021-05-17 ENCOUNTER — Other Ambulatory Visit: Payer: Self-pay

## 2021-05-17 NOTE — Telephone Encounter (Signed)
James at pre-service center [facility], he spoke to [place] at [place]. She closed out PA that was started yesterday and reopened previus PA and updated valid dates. PA# [id number], valid 05/28/21-06/27/21.Marland Kitchen

## 2021-05-21 ENCOUNTER — Encounter: Payer: Self-pay | Admitting: Nurse Practitioner

## 2021-05-21 ENCOUNTER — Other Ambulatory Visit: Payer: Self-pay

## 2021-05-21 ENCOUNTER — Ambulatory Visit: Payer: Medicare HMO | Admitting: Nurse Practitioner

## 2021-05-21 VITALS — BP 115/77 | HR 72 | Ht 64.0 in | Wt 133.0 lb

## 2021-05-21 DIAGNOSIS — E039 Hypothyroidism, unspecified: Secondary | ICD-10-CM

## 2021-05-21 MED ORDER — LEVOTHYROXINE SODIUM 50 MCG PO TABS: 50.0000 ug | ORAL_TABLET | Freq: Every day | ORAL | 3 refills | Status: DC

## 2021-05-21 NOTE — Patient Instructions (Signed)

## 2021-05-21 NOTE — Progress Notes (Signed)
Endocrinology Follow Up Visit                                        05/21/2021, 11:05 AM     SUBJECTIVE:  [name] is a 80 y.o.-year-old female patient being seen in follow up after being seen in consultation for hypothyroidism referred by [name], MD.   Past Medical History:  Diagnosis Date   Anemia    Anxiety    Arthritis    Asthma    Constipation    GERD (gastroesophageal reflux disease)    High blood pressure    History of COVID-19 12/28/2020   Hypothyroidism    PAD (peripheral artery disease) (HCC)    bilateral legs   Peripheral edema    Peripheral neuropathy    bilateral feet at night occ   Psoriasis    Seasonal allergies    Skin cancer    Left eye brow   Varicose veins    Vertigo 11/23/2013    Past Surgical History:  Procedure Laterality Date   APPENDECTOMY     patient unsure   BIOPSY  12/21/2020   Procedure: BIOPSY;  Surgeon: Daneil Dolin, MD;  [facility];  Service: Endoscopy;;   CATARACT EXTRACTION W/PHACO Left 01/27/2017   Procedure: CATARACT EXTRACTION PHACO AND INTRAOCULAR LENS PLACEMENT ([place]);  Surgeon: Tonny Branch, MD;  Location: [facility];  Service: Ophthalmology;  Laterality: Left;  CDE: 29.90   CATARACT EXTRACTION W/PHACO Right 02/24/2017   Procedure: CATARACT EXTRACTION PHACO AND INTRAOCULAR LENS PLACEMENT ([facility]);  Surgeon: Tonny Branch, MD;  Location: [facility];  Service: Ophthalmology;  Laterality: Right;  CDE: 8.57   CHOLECYSTECTOMY     COLONOSCOPY N/A 10/01/2016   Procedure: COLONOSCOPY;  Surgeon: Daneil Dolin, MD; pancolonic diverticulosis, melanosis coli, otherwise normal exam.  No recommendations to repeat colonoscopy.   COLONOSCOPY WITH PROPOFOL N/A 12/21/2020   Procedure: COLONOSCOPY WITH PROPOFOL;  Surgeon: Daneil Dolin, MD;  [facility];  Service: Endoscopy;  Laterality: N/A;  10:30am   ESOPHAGOGASTRODUODENOSCOPY (EGD) WITH PROPOFOL N/A  12/21/2020   Procedure: ESOPHAGOGASTRODUODENOSCOPY (EGD) WITH PROPOFOL;  Surgeon: Daneil Dolin, MD;  [facility];  Service: Endoscopy;  Laterality: N/A;   FLEXOR TENDON REPAIR Right 11/06/2017   Procedure: RIGHT WRIST FLEXOR TENDON REPAIRAND STT [place];  Surgeon: Leanora Cover, MD;  Location: [facility];  Service: Orthopedics;  Laterality: Right;   MOUTH SURGERY     artery bleed   TOTAL HIP ARTHROPLASTY Right 01/31/2021   Procedure: TOTAL HIP ARTHROPLASTY ANTERIOR APPROACH;  Surgeon: Rod Can, MD;  Location: [facility];  Service: Orthopedics;  Laterality: Right;   TUBAL LIGATION      Social History   Socioeconomic History   Marital status: Married    Spouse name: Not on file   Number of children: Not on file   Years of education: Not on file   Highest education level: Not on file  Occupational History   Not on file  Tobacco Use   Smoking status:  Former    Packs/day: 0.25    Years: 5.00    Pack years: 1.25    Types: Cigarettes    Quit date: 12/02/1997    Years since quitting: 23.4   Smokeless tobacco: Never  Vaping Use   Vaping Use: Never used  Substance and Sexual Activity   Alcohol use: Yes    Alcohol/week: 1.0 standard drink    Types: 1 Glasses of wine per week    Comment: occasional wine   Drug use: No   Sexual activity: Not Currently    Birth control/protection: Surgical    Comment: tubal  Other Topics Concern   Not on file  Social History Narrative   Not on file   Social Determinants of Health   Financial Resource Strain: Not on file  Food Insecurity: No Food Insecurity   Worried About Running Out of Food in the Last Year: Never true   Ran Out of Food in the Last Year: Never true  Transportation Needs: No Transportation Needs   Lack of Transportation (Medical): No   Lack of Transportation (Non-Medical): No  Physical Activity: Not on file  Stress: Not on file  Social Connections: Not on file    Family History  Problem  Relation Age of Onset   Hypertension Mother    Varicose Veins Father    Heart attack Father    Heart disease Father    Hypertension Brother    COPD Brother     Outpatient Encounter Medications as of 05/21/2021  Medication Sig   acetaminophen (TYLENOL) 500 MG tablet Take 1,000 mg by mouth every 6 (six) hours as needed for mild pain or moderate pain.   albuterol (VENTOLIN HFA) 108 (90 Base) MCG/ACT inhaler Inhale 2 puffs into the lungs every 6 (six) hours as needed for wheezing or shortness of breath.   ALPRAZolam (XANAX) 0.5 MG tablet Take 0.5 mg by mouth in the morning, at noon, and at bedtime. (1000, 1500, AT BEDTIME)   amLODipine (NORVASC) 2.5 MG tablet Take 2.5 mg by mouth every evening.    aspirin EC 81 MG tablet Take 81 mg by mouth daily. Swallow whole.   chlorhexidine (PERIDEX) 0.12 % solution Use as directed 15 mLs in the mouth or throat 2 (two) times daily as needed (mouth ulcers).   Cholecalciferol (VITAMIN D3) 50 MCG (2000 UT) capsule Take 2,000 Units by mouth daily.   diclofenac Sodium (VOLTAREN) 1 % GEL Apply 1 application topically 4 (four) times daily as needed (back pain).   fluticasone (FLONASE) 50 MCG/ACT nasal spray Place 2 sprays into both nostrils daily as needed for allergies.   levothyroxine (SYNTHROID) 50 MCG tablet Take 1 tablet (50 mcg total) by mouth daily before breakfast.   losartan (COZAAR) 25 MG tablet Take 25 mg by mouth daily. IN THE MORNING.   Multiple Vitamin (MULTIVITAMIN WITH MINERALS) TABS tablet Take 1 tablet by mouth daily in the afternoon.   NONFORMULARY OR COMPOUNDED ITEM Apply 1 application topically 3 (three) times daily as needed (foot neuropathy). neuropathy cream dic 3%/bac 2%/gab %/lid 5%/meth 1%   pantoprazole (PROTONIX) 40 MG tablet Take 40 mg by mouth 2 (two) times daily.   PREVIDENT 5000 BOOSTER PLUS 1.1 % PSTE Place 1 application onto teeth every other day.   raloxifene (EVISTA) 60 MG tablet Take 1 tablet (60 mg total) by mouth every  morning. (Patient taking differently: Take 60 mg by mouth every other day. IN THE MORNING)   SYMBICORT 80-4.5 MCG/ACT inhaler INHALE 2 PUFFS  INTO THE LUNGS TWICE DAILY. (Patient taking differently: Inhale 2 puffs into the lungs daily.)   Turmeric Curcumin 500 MG CAPS Take 500 mg by mouth daily.   venlafaxine XR (EFFEXOR-XR) 37.5 MG 24 hr capsule Take 37.5 mg by mouth daily with breakfast.    [DISCONTINUED] levothyroxine (SYNTHROID) 50 MCG tablet Take 1 tablet (50 mcg total) by mouth daily. (Patient taking differently: Take 50 mcg by mouth daily before breakfast.)   No facility-administered encounter medications on file as of 05/21/2021.    ALLERGIES: Allergies  Allergen Reactions   Amoxicillin-Pot Clavulanate Nausea And Vomiting, Other (See Comments), Itching and Nausea Only    Has patient had a PCN reaction causing immediate rash, facial/tongue/throat swelling, SOB or lightheadedness with hypotension: No Has patient had a PCN reaction causing severe rash involving mucus membranes or skin necrosis: No Has patient had a PCN reaction that required hospitalization: No Has patient had a PCN reaction occurring within the last 10 years: No If all of the above answers are "NO", then may proceed with Cephalosporin use. Has patient had a PCN reaction causing immediate rash, facial/tongue/throat swelling, SOB or lightheadedness with hypotension: No Has patient had a PCN reaction causing severe rash involving mucus membranes or skin necrosis: No Has patient had a PCN reaction that required hospitalization: No Has patient had a PCN reaction occurring within the last 10 years: No If all of the above answers are "NO", then may proceed with Cephalosporin use.    Ace Inhibitors Cough   Entex La Itching   Levofloxacin Hives and Itching   Meloxicam Other (See Comments)    (MOBIC) Unknown reaction type (maybe nausea?)   VACCINATION STATUS: Immunization History  Administered Date(s) Administered    Influenza Whole 09/01/2016   Influenza, High Dose Seasonal PF 09/11/2018   Influenza-Unspecified 08/02/2013, 08/29/2015   PFIZER(Purple Top)SARS-COV-2 Vaccination 12/16/2019, 01/17/2020, 09/28/2020     HPI  Thyroid Problem Presents for follow-up visit. Symptoms include anxiety and constipation. Patient reports no cold intolerance, depressed mood, fatigue, heat intolerance, palpitations, tremors, weight gain or weight loss. The symptoms have been stable.   [name]  is a patient with the above medical history.  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    I reviewed patient's thyroid tests:  Lab Results  Component Value Date   TSH 2.040 05/14/2021   TSH 6.350 (H) 02/08/2021   TSH 2.39 09/28/2020   TSH 0.60 07/25/2020   FREET4 1.36 05/14/2021   FREET4 1.21 02/08/2021   FREET4 1.4 09/28/2020     Pt denies feeling nodules in neck, hoarseness, dysphagia/odynophagia, SOB with lying down.  she has No history of radiation therapy to head or neck.  No recent use of iodine supplements.  I reviewed her chart and she also has a history of GERD with ulcers, depression/anxiety, anemia, asthma, and high blood pressure.   Review of systems  Constitutional: + Minimally fluctuating body weight,  current Body mass index is 22.83 kg/m. , no fatigue, no subjective hyperthermia, no subjective hypothermia, + insomnia Eyes: no blurry vision, no xerophthalmia ENT: no sore throat, no nodules palpated in throat, no dysphagia/odynophagia, no hoarseness Cardiovascular: no chest pain, no shortness of breath, no palpitations, no leg swelling Respiratory: no cough, no shortness of breath Gastrointestinal: no nausea/vomiting/diarrhea, + stomach pain and constipation (has ulcer- having follow up endoscopy in next few weeks) Musculoskeletal: no muscle/joint aches Skin: no rashes, no hyperemia Neurological: no tremors, no numbness, no tingling, no  dizziness Psychiatric: no depression, + anxiety   OBJECTIVE:  BP 115/77   Pulse 72   Ht 5\' 4"  (1.626 m)   Wt 133 lb (60.3 kg)   BMI 22.83 kg/m  Wt Readings from Last 3 Encounters:  05/21/21 133 lb (60.3 kg)  05/14/21 133 lb (60.3 kg)  04/18/21 134 lb 7.7 oz (61 kg)   BP Readings from Last 3 Encounters:  05/21/21 115/77  05/14/21 117/85  04/18/21 (!) 152/95      Physical Exam- Limited  Constitutional:  Body mass index is 22.83 kg/m. , not in acute distress, normal state of mind Eyes:  EOMI, no exophthalmos Neck: Supple Cardiovascular: RRR, no murmurs, rubs, or gallops, no edema Respiratory: Adequate breathing efforts, no crackles, rales, rhonchi, or wheezing Musculoskeletal: no gross deformities, strength intact in all four extremities, no gross restriction of joint movements Skin:  no rashes, no hyperemia Neurological: no tremor with outstretched hands   CMP ( most recent) CMP     Component Value Date/Time   NA 139 03/15/2021 1220   NA 141 09/24/2018 1204   K 3.3 (L) 03/15/2021 1220   CL 102 03/15/2021 1220   CO2 25 03/15/2021 1220   GLUCOSE 88 03/15/2021 1220   BUN 10 03/15/2021 1220   BUN 13 09/24/2018 1204   CREATININE 0.66 03/15/2021 1220   CREATININE 0.94 (H) 01/22/2021 1419   CALCIUM 8.8 (L) 03/15/2021 1220   PROT 6.6 03/15/2021 1220   PROT 6.5 09/24/2018 1204   ALBUMIN 3.8 03/15/2021 1220   ALBUMIN 4.3 09/24/2018 1204   AST 24 03/15/2021 1220   ALT 15 03/15/2021 1220   ALKPHOS 69 03/15/2021 1220   BILITOT 0.4 03/15/2021 1220   BILITOT 0.4 09/24/2018 1204   GFRNONAA >60 03/15/2021 1220   GFRAA >60 05/08/2020 1232     Diabetic Labs (most recent): No results found for: HGBA1C   Lipid Panel ( most recent) Lipid Panel     Component Value Date/Time   TRIG 92 09/11/2020 1547       Lab Results  Component Value Date   TSH 2.040 05/14/2021   TSH 6.350 (H) 02/08/2021   TSH 2.39 09/28/2020   TSH 0.60 07/25/2020   FREET4 1.36 05/14/2021    FREET4 1.21 02/08/2021   FREET4 1.4 09/28/2020       ASSESSMENT / PLAN: 1. Hypothyroidism- unspecified etiology   Patient with long-standing hypothyroidism, on levothyroxine therapy. On physical exam, patient  does not have gross goiter, thyroid nodules, or neck compression symptoms.  Thyroid US WNL for age.  -Her previsit thyroid function tests are consistent with appropriate hormone replacement.  She is advised to continue Levothyroxine 50 mcg po daily before breakfast.   - We discussed about correct intake of levothyroxine, at fasting, with water, separated by at least 30 minutes from breakfast. -Patient is made aware of the fact that thyroid hormone replacement is needed for life, dose to be adjusted by periodic monitoring of thyroid function tests.      I spent 30 minutes in the care of the patient today including review of labs from Thyroid Function, CMP, and other relevant labs ; imaging/biopsy records (current and previous including abstractions from other facilities); face-to-face time discussing  her lab results and symptoms, medications doses, her options of short and long term treatment based on the latest standards of care / guidelines;   and documenting the encounter.  [name]  participated in the discussions, expressed understanding, and voiced agreement with the above  plans.  All questions were answered to her satisfaction. she is encouraged to contact clinic should she have any questions or concerns prior to her return visit.   Follow Up Plan: Return in about 4 months (around 09/20/2021) for Thyroid follow up, Previsit labs.    Rayetta Pigg, [facility] [address] [place], [place] [address] Phone: [phone] Fax: [phone]  05/21/2021, 11:05 AM

## 2021-05-25 ENCOUNTER — Other Ambulatory Visit: Payer: Self-pay

## 2021-05-25 NOTE — Patient Outreach (Signed)
[place] [facility]) Care Management  05/25/2021  [name] [birth year] [id number]   Telephone call to patient for follow up. Female answered stating patient not available. CM contact information provided.   Plan: RN CM will attempt patient again in 4 business days.   Jone Baseman, RN, MSN [facility] Care Management Coordinator Direct Line [phone] Cell [phone] Toll Free: [phone]  Fax: [phone]

## 2021-05-28 ENCOUNTER — Other Ambulatory Visit: Payer: Self-pay

## 2021-05-28 ENCOUNTER — Ambulatory Visit: Payer: Medicare HMO | Admitting: Gastroenterology

## 2021-05-28 ENCOUNTER — Ambulatory Visit (HOSPITAL_COMMUNITY): Payer: Medicare HMO | Admitting: Anesthesiology

## 2021-05-28 ENCOUNTER — Ambulatory Visit (HOSPITAL_COMMUNITY)
Admission: RE | Admit: 2021-05-28 | Discharge: 2021-05-28 | Disposition: A | Discharge status: Home / Self Care | Payer: Medicare HMO | Attending: Internal Medicine | Admitting: Internal Medicine

## 2021-05-28 ENCOUNTER — Encounter (HOSPITAL_COMMUNITY)
Admission: RE | Disposition: A | Discharge status: Home / Self Care | Payer: Self-pay | Source: Home / Self Care | Attending: Internal Medicine

## 2021-05-28 ENCOUNTER — Encounter (HOSPITAL_COMMUNITY): Payer: Self-pay

## 2021-05-28 DIAGNOSIS — K2970 Gastritis, unspecified, without bleeding: Secondary | ICD-10-CM | POA: Diagnosis not present

## 2021-05-28 DIAGNOSIS — Z8249 Family history of ischemic heart disease and other diseases of the circulatory system: Secondary | ICD-10-CM | POA: Diagnosis not present

## 2021-05-28 DIAGNOSIS — Z881 Allergy status to other antibiotic agents status: Secondary | ICD-10-CM | POA: Diagnosis not present

## 2021-05-28 DIAGNOSIS — K219 Gastro-esophageal reflux disease without esophagitis: Secondary | ICD-10-CM | POA: Diagnosis not present

## 2021-05-28 DIAGNOSIS — Z8616 Personal history of COVID-19: Secondary | ICD-10-CM | POA: Insufficient documentation

## 2021-05-28 DIAGNOSIS — Z87891 Personal history of nicotine dependence: Secondary | ICD-10-CM | POA: Diagnosis not present

## 2021-05-28 DIAGNOSIS — Z888 Allergy status to other drugs, medicaments and biological substances status: Secondary | ICD-10-CM | POA: Diagnosis not present

## 2021-05-28 DIAGNOSIS — Z791 Long term (current) use of non-steroidal anti-inflammatories (NSAID): Secondary | ICD-10-CM | POA: Insufficient documentation

## 2021-05-28 DIAGNOSIS — E039 Hypothyroidism, unspecified: Secondary | ICD-10-CM | POA: Diagnosis not present

## 2021-05-28 DIAGNOSIS — K279 Peptic ulcer, site unspecified, unspecified as acute or chronic, without hemorrhage or perforation: Secondary | ICD-10-CM

## 2021-05-28 DIAGNOSIS — Z7951 Long term (current) use of inhaled steroids: Secondary | ICD-10-CM | POA: Insufficient documentation

## 2021-05-28 DIAGNOSIS — Z79890 Hormone replacement therapy (postmenopausal): Secondary | ICD-10-CM | POA: Insufficient documentation

## 2021-05-28 DIAGNOSIS — Z79899 Other long term (current) drug therapy: Secondary | ICD-10-CM | POA: Insufficient documentation

## 2021-05-28 DIAGNOSIS — Z8711 Personal history of peptic ulcer disease: Secondary | ICD-10-CM | POA: Diagnosis not present

## 2021-05-28 DIAGNOSIS — K3189 Other diseases of stomach and duodenum: Secondary | ICD-10-CM | POA: Diagnosis not present

## 2021-05-28 DIAGNOSIS — R1033 Periumbilical pain: Secondary | ICD-10-CM | POA: Diagnosis not present

## 2021-05-28 HISTORY — PX: BIOPSY: SHX5522

## 2021-05-28 HISTORY — PX: ESOPHAGOGASTRODUODENOSCOPY (EGD) WITH PROPOFOL: SHX5813

## 2021-05-28 SURGERY — ESOPHAGOGASTRODUODENOSCOPY (EGD) WITH PROPOFOL
Anesthesia: General

## 2021-05-28 MED ORDER — LACTATED RINGERS IV SOLN: INTRAVENOUS | Status: DC

## 2021-05-28 MED ORDER — PROPOFOL 10 MG/ML IV BOLUS
INTRAVENOUS | Status: DC | PRN
  Administered 2021-05-28: 100 mg via INTRAVENOUS

## 2021-05-28 MED ORDER — LIDOCAINE HCL (CARDIAC) PF 100 MG/5ML IV SOSY
PREFILLED_SYRINGE | INTRAVENOUS | Status: DC | PRN
  Administered 2021-05-28: 50 mg via INTRAVENOUS

## 2021-05-28 MED ORDER — PANTOPRAZOLE SODIUM 40 MG PO TBEC: 40.0000 mg | DELAYED_RELEASE_TABLET | Freq: Every day | ORAL | 5 refills | Status: DC

## 2021-05-28 NOTE — Discharge Instructions (Signed)
EGD Discharge instructions Please read the instructions outlined below and refer to this sheet in the next few weeks. These discharge instructions provide you with general information on caring for yourself after you leave the hospital. Your doctor may also give you specific instructions. While your treatment has been planned according to the most current medical practices available, unavoidable complications occasionally occur. If you have any problems or questions after discharge, please call your doctor. ACTIVITY You may resume your regular activity but move at a slower pace for the next 24 hours.  Take frequent rest periods for the next 24 hours.  Walking will help expel (get rid of) the air and reduce the bloated feeling in your abdomen.  No driving for 24 hours (because of the anesthesia (medicine) used during the test).  You may shower.  Do not sign any important legal documents or operate any machinery for 24 hours (because of the anesthesia used during the test).  NUTRITION Drink plenty of fluids.  You may resume your normal diet.  Begin with a light meal and progress to your normal diet.  Avoid alcoholic beverages for 24 hours or as instructed by your caregiver.  MEDICATIONS You may resume your normal medications unless your caregiver tells you otherwise.  WHAT YOU CAN EXPECT TODAY You may experience abdominal discomfort such as a feeling of fullness or "gas" pains.  FOLLOW-UP Your doctor will discuss the results of your test with you.  SEEK IMMEDIATE MEDICAL ATTENTION IF ANY OF THE FOLLOWING OCCUR: Excessive nausea (feeling sick to your stomach) and/or vomiting.  Severe abdominal pain and distention (swelling).  Trouble swallowing.  Temperature over 101 F (37.8 C).  Rectal bleeding or vomiting of blood.   Your EGD revealed a mild amount inflammation in your stomach.  The previous noted ulcer has healed.  Your esophagitis has also healed.  I took biopsies of your stomach to  rule infection a bacteria called H. pylori.  I want you to decrease your pantoprazole to once daily.  Avoid NSAIDs as best as you can.  Await pathology results, my office will contact you.  Follow-up with GI in 2 to 3 months.  I hope you have a great rest of your week!  Elon Alas. Abbey Chatters, D.O. Gastroenterology and Hepatology [facility]

## 2021-05-28 NOTE — Op Note (Signed)
[facility] Patient Name: [name] Procedure Date: 05/28/2021 11:39 AM MRN: [id number] Date of Birth: 1941/04/30 Attending MD: Elon Alas. Abbey Chatters DO CSN: [id number] Age: 80 Admit Type: Outpatient Procedure:                Upper GI endoscopy Indications:              Periumbilical abdominal pain, Follow-up of                            esophageal reflux, Follow-up of peptic ulcer Providers:                Elon Alas. Daniya Aramburo, DO, [place]is [place] B. Sharon Seller, RN,                            Nelma Rothman, Technician Referring MD:              Medicines:                See the Anesthesia note for documentation of the                            administered medications Complications:            No immediate complications. Estimated Blood Loss:     Estimated blood loss was minimal. Procedure:                Pre-Anesthesia Assessment:                           - The anesthesia plan was to use monitored                            anesthesia care (MAC).                           After obtaining informed consent, the endoscope was                            passed under direct vision. Throughout the                            procedure, the patient's blood pressure, pulse, and                            oxygen saturations were monitored continuously. The                            [id number]-H190 ([id number]) scope was introduced through the                            mouth, and advanced to the second part of duodenum.                            The upper GI endoscopy was accomplished without                            difficulty.  The patient tolerated the procedure                            well. Scope In: 12:06:31 PM Scope Out: 12:09:22 PM Total Procedure Duration: 0 hours 2 minutes 51 seconds  Findings:      There is no endoscopic evidence of bleeding, areas of erosion,       esophagitis, hiatal hernia, ulcerations or varices in the entire       esophagus.      Localized mild inflammation  characterized by erythema and linear       erosions was found in the gastric antrum. Biopsies were taken with a       cold forceps for Helicobacter pylori testing.      The duodenal bulb, first portion of the duodenum and second portion of       the duodenum were normal. Impression:               - Gastritis. Biopsied.                           - Normal duodenal bulb, first portion of the                            duodenum and second portion of the duodenum. Moderate Sedation:      Per Anesthesia Care Recommendation:           - Patient has a contact number available for                            emergencies. The signs and symptoms of potential                            delayed complications were discussed with the                            patient. Return to normal activities tomorrow.                            Written discharge instructions were provided to the                            patient.                           - Resume previous diet.                           - Continue present medications.                           - Await pathology results.                           - Return to GI clinic in 3 months.                           - Use a proton pump inhibitor PO daily.                           -  Try to avoid ibuprofen, naproxen, or other                            non-steroidal anti-inflammatory drugs. Procedure Code(s):        --- Professional ---                           [phone], Esophagogastroduodenoscopy, flexible,                            transoral; with biopsy, single or multiple Diagnosis Code(s):        --- Professional ---                           K29.70, Gastritis, unspecified, without bleeding                           M78.67, Periumbilical pain                           K21.9, Gastro-esophageal reflux disease without                            esophagitis                           K27.9, Peptic ulcer, site unspecified, unspecified                             as acute or chronic, without hemorrhage or                            perforation CPT copyright 2019 American Medical Association. All rights reserved. The codes documented in this report are preliminary and upon coder review may  be revised to meet current compliance requirements. Elon Alas. Abbey Chatters, DO [place] Abbey Chatters, DO 05/28/2021 12:12:44 PM This report has been signed electronically. Number of Addenda: 0

## 2021-05-28 NOTE — Anesthesia Postprocedure Evaluation (Signed)
Anesthesia Post Note  Patient: [name]  Procedure(s) Performed: ESOPHAGOGASTRODUODENOSCOPY (EGD) WITH PROPOFOL BIOPSY  Patient location during evaluation: Endoscopy Anesthesia Type: General Level of consciousness: awake and alert and oriented Pain management: pain level controlled Vital Signs Assessment: post-procedure vital signs reviewed and stable Respiratory status: spontaneous breathing and respiratory function stable Cardiovascular status: blood pressure returned to baseline and stable Postop Assessment: no apparent nausea or vomiting Anesthetic complications: no   No notable events documented.   Last Vitals:  Vitals:   05/28/21 1104 05/28/21 1212  BP:  130/84  Pulse: 91 75  Resp: 17 20  Temp: 36.6 C 36.7 C  SpO2: 97% 99%    Last Pain:  Vitals:   05/28/21 1212  TempSrc: Oral  PainSc: 0-No pain                 Diezel Mazur C Kween Bacorn

## 2021-05-28 NOTE — Anesthesia Preprocedure Evaluation (Signed)
Anesthesia Evaluation  Patient identified by MRN, date of birth, ID band Patient awake    Reviewed: Allergy & Precautions, NPO status , Patient's Chart, lab work & pertinent test results  History of Anesthesia Complications Negative for: history of anesthetic complications  Airway Mallampati: II  TM Distance: >3 FB Neck ROM: Full    Dental  (+) Dental Advisory Given   Pulmonary shortness of breath and with exertion, asthma , former smoker,    Pulmonary exam normal breath sounds clear to auscultation       Cardiovascular Exercise Tolerance: Good hypertension, Pt. on medications + Peripheral Vascular Disease  Normal cardiovascular exam Rhythm:Regular Rate:Normal     Neuro/Psych Anxiety  Neuromuscular disease    GI/Hepatic Neg liver ROS, PUD, GERD  Medicated,  Endo/Other  Hypothyroidism   Renal/GU      Musculoskeletal  (+) Arthritis ,   Abdominal   Peds  Hematology  (+) anemia ,   Anesthesia Other Findings Vertigo    Reproductive/Obstetrics                             Anesthesia Physical  Anesthesia Plan  ASA: 3  Anesthesia Plan: General   Post-op Pain Management:    Induction: Intravenous  PONV Risk Score and Plan: TIVA and Propofol infusion  Airway Management Planned: Nasal Cannula and Natural Airway  Additional Equipment:   Intra-op Plan:   Post-operative Plan:   Informed Consent: I have reviewed the patients History and Physical, chart, labs and discussed the procedure including the risks, benefits and alternatives for the proposed anesthesia with the patient or authorized representative who has indicated his/her understanding and acceptance.     Dental advisory given  Plan Discussed with: CRNA and Surgeon  Anesthesia Plan Comments:         Anesthesia Quick Evaluation

## 2021-05-28 NOTE — H&P (Signed)
Primary Care Physician:  [name], MD Primary Gastroenterologist:  Dr. Abbey Chatters  Pre-Procedure History & Physical: HPI:  [name] is a 80 y.o. female is here for an EGD for erosive esophagitis and gastric ulcers, follow up with Barrett's screening and check healing of ulcers.   Past Medical History:  Diagnosis Date   Anemia    Anxiety    Arthritis    Asthma    Constipation    GERD (gastroesophageal reflux disease)    High blood pressure    History of COVID-19 12/28/2020   Hypothyroidism    PAD (peripheral artery disease) (HCC)    bilateral legs   Peripheral edema    Peripheral neuropathy    bilateral feet at night occ   Psoriasis    Seasonal allergies    Skin cancer    Left eye brow   Varicose veins    Vertigo 11/23/2013    Past Surgical History:  Procedure Laterality Date   APPENDECTOMY     patient unsure   BIOPSY  12/21/2020   Procedure: BIOPSY;  Surgeon: Daneil Dolin, MD;  Location: [facility];  Service: Endoscopy;;   CATARACT EXTRACTION W/PHACO Left 01/27/2017   Procedure: CATARACT EXTRACTION PHACO AND INTRAOCULAR LENS PLACEMENT ([place]);  Surgeon: Tonny Branch, MD;  Location: [facility];  Service: Ophthalmology;  Laterality: Left;  CDE: 29.90   CATARACT EXTRACTION W/PHACO Right 02/24/2017   Procedure: CATARACT EXTRACTION PHACO AND INTRAOCULAR LENS PLACEMENT ([facility]);  Surgeon: Tonny Branch, MD;  Location: [facility];  Service: Ophthalmology;  Laterality: Right;  CDE: 8.57   CHOLECYSTECTOMY     COLONOSCOPY N/A 10/01/2016   Procedure: COLONOSCOPY;  Surgeon: Daneil Dolin, MD; pancolonic diverticulosis, melanosis coli, otherwise normal exam.  No recommendations to repeat colonoscopy.   COLONOSCOPY WITH PROPOFOL N/A 12/21/2020   Procedure: COLONOSCOPY WITH PROPOFOL;  Surgeon: Daneil Dolin, MD;  [facility];  Service: Endoscopy;  Laterality: N/A;  10:30am   ESOPHAGOGASTRODUODENOSCOPY (EGD) WITH PROPOFOL N/A 12/21/2020   Procedure: ESOPHAGOGASTRODUODENOSCOPY  (EGD) WITH PROPOFOL;  Surgeon: Daneil Dolin, MD;  [facility];  Service: Endoscopy;  Laterality: N/A;   FLEXOR TENDON REPAIR Right 11/06/2017   Procedure: RIGHT WRIST FLEXOR TENDON REPAIRAND STT [place];  Surgeon: Leanora Cover, MD;  Location: [place];  Service: Orthopedics;  Laterality: Right;   MOUTH SURGERY     artery bleed   TOTAL HIP ARTHROPLASTY Right 01/31/2021   Procedure: TOTAL HIP ARTHROPLASTY ANTERIOR APPROACH;  Surgeon: Rod Can, MD;  Location: [facility];  Service: Orthopedics;  Laterality: Right;   TUBAL LIGATION      Prior to Admission medications   Medication Sig Start Date End Date Taking? Authorizing Provider  acetaminophen (TYLENOL) 500 MG tablet Take 500-1,000 mg by mouth every 6 (six) hours as needed for mild pain or moderate pain.   Yes [provider]  albuterol (VENTOLIN HFA) 108 (90 Base) MCG/ACT inhaler Inhale 2 puffs into the lungs every 6 (six) hours as needed for wheezing or shortness of breath.   Yes [provider]  ALPRAZolam (XANAX) 0.5 MG tablet Take 0.5 mg by mouth in the morning, at noon, and at bedtime. (1000, 1500, AT BEDTIME)   Yes [provider]  amLODipine (NORVASC) 2.5 MG tablet Take 2.5 mg by mouth every evening.  02/09/13  Yes [provider]  aspirin EC 81 MG tablet Take 81 mg by mouth daily. Swallow whole.   Yes [provider]  chlorhexidine (  PERIDEX) 0.12 % solution Use as directed 15 mLs in the mouth or throat 2 (two) times daily as needed (mouth ulcers).   Yes [provider]  Cholecalciferol (VITAMIN D3) 50 MCG (2000 UT) capsule Take 2,000 Units by mouth daily.   Yes [provider]  diclofenac Sodium (VOLTAREN) 1 % GEL Apply 1 application topically 4 (four) times daily as needed (back pain).   Yes [provider]  fluticasone (FLONASE) 50 MCG/ACT nasal spray Place 2 sprays into both nostrils daily as needed for allergies.   Yes [name], MD  levothyroxine (SYNTHROID) 50 MCG tablet Take 1 tablet (50 mcg total) by mouth daily before breakfast. 05/21/21  Yes Brita Romp, NP  losartan (COZAAR) 25 MG tablet Take 25 mg by mouth daily. 07/14/20  Yes [provider]  Multiple Vitamin (MULTIVITAMIN WITH MINERALS) TABS tablet Take 1 tablet by mouth daily in the afternoon.   Yes [provider]  NONFORMULARY OR COMPOUNDED ITEM Apply 1 application topically 3 (three) times daily as needed (foot neuropathy). neuropathy cream dic 3%/bac 2%/gab %/lid 5%/meth 1%   Yes [provider]  pantoprazole (PROTONIX) 40 MG tablet Take 40 mg by mouth 2 (two) times daily. 01/17/21  Yes [provider]  polyethylene glycol (MIRALAX / GLYCOLAX) 17 g packet Take 17 g by mouth 2 (two) times daily.   Yes [provider]  PREVIDENT 5000 BOOSTER PLUS 1.1 % PSTE Place 1 application onto teeth every other day. 09/28/20  Yes [provider]  raloxifene (EVISTA) 60 MG tablet Take 1 tablet (60 mg total) by mouth every morning. Patient taking differently: Take 60 mg by mouth every other day. IN THE MORNING 04/10/20  Yes Estill Dooms, NP  SYMBICORT 80-4.5 MCG/ACT inhaler INHALE 2 PUFFS INTO THE LUNGS TWICE DAILY. Patient taking differently: Inhale 2 puffs into the lungs daily. 02/02/18  Yes Tanda Rockers, MD  Turmeric Curcumin 500 MG CAPS Take 500 mg by mouth daily.   Yes [provider]  venlafaxine XR (EFFEXOR-XR) 37.5 MG 24 hr capsule Take 37.5 mg by mouth daily with breakfast.  07/10/20  Yes [provider]    Allergies as of 05/16/2021 - Review Complete 05/14/2021  Allergen Reaction Noted   Amoxicillin-pot clavulanate Nausea And Vomiting, Other (See Comments), Itching, and Nausea Only 10/04/2013   Ace inhibitors Cough    Entex la Itching    Levofloxacin Hives and Itching 01/21/2017   Meloxicam Other (See Comments) 04/25/2020    Family History  Problem Relation Age of Onset    Hypertension Mother    Varicose Veins Father    Heart attack Father    Heart disease Father    Hypertension Brother    COPD Brother     Social History   Socioeconomic History   Marital status: Married    Spouse name: Not on file   Number of children: Not on file   Years of education: Not on file   Highest education level: Not on file  Occupational History   Not on file  Tobacco Use   Smoking status: Former    Packs/day: 0.25    Years: 5.00    Pack years: 1.25    Types: Cigarettes    Quit date: 12/02/1997    Years since quitting: 23.5   Smokeless tobacco: Never  Vaping Use   Vaping Use: Never used  Substance and Sexual Activity   Alcohol use: Yes    Alcohol/week: 1.0 standard drink  Types: 1 Glasses of wine per week    Comment: occasional wine   Drug use: No   Sexual activity: Not Currently    Birth control/protection: Surgical    Comment: tubal  Other Topics Concern   Not on file  Social History Narrative   Not on file   Social Determinants of Health   Financial Resource Strain: Not on file  Food Insecurity: No Food Insecurity   Worried About Running Out of Food in the Last Year: Never true   Ran Out of Food in the Last Year: Never true  Transportation Needs: No Transportation Needs   Lack of Transportation (Medical): No   Lack of Transportation (Non-Medical): No  Physical Activity: Not on file  Stress: Not on file  Social Connections: Not on file  Intimate Partner Violence: Not on file    Review of Systems: See HPI, otherwise negative ROS  Physical Exam: Vital signs in last 24 hours: Temp:  [97.8 F (36.6 C)] 97.8 F (36.6 C) (06/27 1104) Pulse Rate:  [91] 91 (06/27 1104) Resp:  [17] 17 (06/27 1104) SpO2:  [97 %] 97 % (06/27 1104)   General:   Alert,  Well-developed, well-nourished, pleasant and cooperative in NAD Head:  Normocephalic and atraumatic. Eyes:  Sclera clear, no icterus.   Conjunctiva pink. Ears:  Normal auditory acuity. Nose:  No  deformity, discharge,  or lesions. Mouth:  No deformity or lesions, dentition normal. Neck:  Supple; no masses or thyromegaly. Lungs:  Clear throughout to auscultation.   No wheezes, crackles, or rhonchi. No acute distress. Heart:  Regular rate and rhythm; no murmurs, clicks, rubs,  or gallops. Abdomen:  Soft, nontender and nondistended. No masses, hepatosplenomegaly or hernias noted. Normal bowel sounds, without guarding, and without rebound.   Msk:  Symmetrical without gross deformities. Normal posture. Extremities:  Without clubbing or edema. Neurologic:  Alert and  oriented x4;  grossly normal neurologically. Skin:  Intact without significant lesions or rashes. Cervical Nodes:  No significant cervical adenopathy. Psych:  Alert and cooperative. Normal mood and affect.  Impression/Plan: RONETTE HANK is here for an EGD for erosive esophagitis and gastric ulcers, follow up with Barrett's screening and check healing of ulcers.   The risks of the procedure including infection, bleed, or perforation as well as benefits, limitations, alternatives and imponderables have been reviewed with the patient. Questions have been answered. All parties agreeable.

## 2021-05-28 NOTE — Transfer of Care (Signed)
Immediate Anesthesia Transfer of Care Note  Patient: [name]  Procedure(s) Performed: ESOPHAGOGASTRODUODENOSCOPY (EGD) WITH PROPOFOL BIOPSY  Patient Location: Endoscopy Unit  Anesthesia Type:General  Level of Consciousness: awake and alert   Airway & Oxygen Therapy: Patient Spontanous Breathing  Post-op Assessment: Report given to RN and Post -op Vital signs reviewed and stable  Post vital signs: Reviewed and stable  Last Vitals:  Vitals Value Taken Time  BP    Temp    Pulse    Resp    SpO2      Last Pain:  Vitals:   05/28/21 1202  TempSrc:   PainSc: 0-No pain         Complications: No notable events documented.

## 2021-05-29 LAB — SURGICAL PATHOLOGY

## 2021-05-31 ENCOUNTER — Telehealth: Payer: Self-pay | Admitting: Internal Medicine

## 2021-05-31 ENCOUNTER — Other Ambulatory Visit: Payer: Self-pay

## 2021-05-31 NOTE — Patient Outreach (Signed)
[facility]) Care Management  [place]  05/31/2021   [name] 08/08/1941 [id number]  Subjective: Telephone call to patient for follow up. She reports having upper endoscopy. She reports their was a biopsy for some inflammation. She is waiting for results. No more falls. She reports blood pressure less than 140/80.  Discussed hypertension management.  She verbalized understanding.   Objective:   Encounter Medications:  Outpatient Encounter Medications as of 05/31/2021  Medication Sig   acetaminophen (TYLENOL) 500 MG tablet Take 500-1,000 mg by mouth every 6 (six) hours as needed for mild pain or moderate pain.   albuterol (VENTOLIN HFA) 108 (90 Base) MCG/ACT inhaler Inhale 2 puffs into the lungs every 6 (six) hours as needed for wheezing or shortness of breath.   ALPRAZolam (XANAX) 0.5 MG tablet Take 0.5 mg by mouth in the morning, at noon, and at bedtime. (1000, 1500, AT BEDTIME)   amLODipine (NORVASC) 2.5 MG tablet Take 2.5 mg by mouth every evening.    aspirin EC 81 MG tablet Take 81 mg by mouth daily. Swallow whole.   chlorhexidine (PERIDEX) 0.12 % solution Use as directed 15 mLs in the mouth or throat 2 (two) times daily as needed (mouth ulcers).   Cholecalciferol (VITAMIN D3) 50 MCG (2000 UT) capsule Take 2,000 Units by mouth daily.   diclofenac Sodium (VOLTAREN) 1 % GEL Apply 1 application topically 4 (four) times daily as needed (back pain).   fluticasone (FLONASE) 50 MCG/ACT nasal spray Place 2 sprays into both nostrils daily as needed for allergies.   levothyroxine (SYNTHROID) 50 MCG tablet Take 1 tablet (50 mcg total) by mouth daily before breakfast.   losartan (COZAAR) 25 MG tablet Take 25 mg by mouth daily.   Multiple Vitamin (MULTIVITAMIN WITH MINERALS) TABS tablet Take 1 tablet by mouth daily in the afternoon.   NONFORMULARY OR COMPOUNDED ITEM Apply 1 application topically 3 (three) times daily as needed (foot neuropathy). neuropathy cream  dic 3%/bac 2%/gab %/lid 5%/meth 1%   pantoprazole (PROTONIX) 40 MG tablet Take 1 tablet (40 mg total) by mouth daily.   polyethylene glycol (MIRALAX / GLYCOLAX) 17 g packet Take 17 g by mouth 2 (two) times daily.   PREVIDENT 5000 BOOSTER PLUS 1.1 % PSTE Place 1 application onto teeth every other day.   raloxifene (EVISTA) 60 MG tablet Take 1 tablet (60 mg total) by mouth every morning. (Patient taking differently: Take 60 mg by mouth every other day. IN THE MORNING)   SYMBICORT 80-4.5 MCG/ACT inhaler INHALE 2 PUFFS INTO THE LUNGS TWICE DAILY. (Patient taking differently: Inhale 2 puffs into the lungs daily.)   Turmeric Curcumin 500 MG CAPS Take 500 mg by mouth daily.   venlafaxine XR (EFFEXOR-XR) 37.5 MG 24 hr capsule Take 37.5 mg by mouth daily with breakfast.    No facility-administered encounter medications on file as of 05/31/2021.    Functional Status:  In your present state of health, do you have any difficulty performing the following activities: 05/31/2021 02/05/2021  Hearing? Tempie Donning  Comment wears hearing aids wears hearing aids  Vision? N N  Difficulty concentrating or making decisions? N N  Walking or climbing stairs? Y Y  Comment right hip replacement right hip replacement  Dressing or bathing? N N  Doing errands, shopping? Tempie Donning  Comment family helps family helps  Preparing Food and eating ? Tempie Donning  Comment family helps family helps  Using the Toilet? N N  In the past six months,  have you accidently leaked urine? N N  Do you have problems with loss of bowel control? N N  Managing your Medications? N N  Managing your Finances? N N  Housekeeping or managing your Housekeeping? Tempie Donning  Comment family helps family helps  Some recent data might be hidden    Fall/Depression Screening: Fall Risk  05/31/2021 04/26/2021 02/05/2021  Falls in the past year? 1 1 1   Comment - - -  Number falls in past yr: 1 1 0  Comment - - -  Injury with Fall? 1 1 1   Comment - - -  Risk for fall due to :  Impaired balance/gait;History of fall(s) Impaired balance/gait;History of fall(s) History of fall(s)  Follow up Falls prevention discussed Falls prevention discussed;Education provided Falls prevention discussed;Education provided   [facility] 2/9 Scores 05/31/2021 02/05/2021 11/21/2020 05/03/2020 04/10/2020 08/13/2019  PHQ - 2 Score 0 0 0 0 0 0  PHQ- 9 Score - - - - 3 -    Assessment:   Care Plan Care Plan : Hypertension (Adult)  Updates made by Jon Billings, RN since 05/31/2021 12:00 AM     Problem: Hypertension (Hypertension)   Priority: High     Long-Range Goal: Hypertension Monitored as evidenced by blood pressure less than 140/80.   Start Date: 11/21/2020  Expected End Date: 12/01/2021  This Visit's Progress: On track  Recent Progress: On track  Priority: High  Note:   Evidence-based guidance:  Review blood pressure measurements taken inside and outside of the provider  office; establish baseline and monitor trends; compare to target ranges or patient  goal   Notes:     Task: Identify and Monitor Blood Pressure Elevation   Due Date: 12/01/2021  Priority: Routine  Responsible User: Jon Billings, RN  Note:   Care Management Activities:    - blood pressure trends reviewed - home or ambulatory blood pressure monitoring encouraged    Notes: Recent blood pressure 144/68. 04/26/21 Patient reports blood pressure has been good.  Discussed activity.   05/31/21 Patient reports blood pressure less than 140/80.   Hypertension Management Discussed Take medications as ordered Limit salt intake. Eat plenty of fruits and vegetables Remain as active as possible Take blood pressure at least weekly at home and record in a notebook to take to doctors visits.       Goals Addressed             This Visit's Progress    COMPLETED: Make and Keep All Appointments   On track    Barriers: None Timeframe:  Long-Range Goal Priority:  High Start Date:  11/21/20                            Expected End Date:   08/01/21            Follow Up Date 05/31/21  - ask family or friend for a ride    Why is this important?   Part of staying healthy is seeing the doctor for follow-up care.  If you forget your appointments, there are some things you can do to stay on track.    Notes: Patient to make follow up appointment with surgeon.  02/21/21 Patient saw surgeon on 02/19/21. Visit went well.   03/21/21 Patient seeing physician as scheduled 04/26/21 No transportation problems.      Prevent Falls and Injury   On track    Barriers: Knowledge Timeframe:  Short-Term Goal Priority:  High Start Date:     04/26/21                        Expected End Date: 08/01/21               Follow Up Date 06/30/21   - learn how to get back up if I fall - make an emergency alert plan in case I fall - use a nonslip pad with throw rugs, or remove them completely - use a cane or walker    Why is this important?   Most falls happen when it is hard for you to walk safely. Your balance may be off because of an illness. You may have pain in your knees, hip or other joints.  You may be overly tired or taking medicines that make you sleepy. You may not be able to see or hear clearly.  Falls can lead to broken bones, bruises or other injuries.  There are things you can do to help prevent falling.     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  Patient limited weight bearing to see surgeon 05/07/21.  05/31/21 No more falls since last fall on 04-18-21. Falls Prevention Discussed Keeping moving and stay active Keep your bones strong Go for regular eye checkups Always stand up slowly Wear proper non-slip footwear Light up your living space Install assistive devices.       Track and Manage My Blood Pressure-Hypertension   On track    Barriers: Knowledge Timeframe:  Long-Range Goal Priority:  High Start Date:  11/21/20                           Expected End Date:   12/01/21  Follow Up Date  07/01/21   - check blood pressure weekly - write blood pressure results in a log or diary    Why is this important?   You won't feel high blood pressure, but it can still hurt your blood vessels.  High blood pressure can cause heart or kidney problems. It can also cause a stroke.  Making lifestyle changes like losing a little weight or eating less salt will help.  Checking your blood pressure at home and at different times of the day can help to control blood pressure.  If the doctor prescribes medicine remember to take it the way the doctor ordered.  Call the office if you cannot afford the medicine or if there are questions about it.     Notes: 02/21/21 Patient blood pressure less than 140/80. 03/21/21 Most recent blood pressure 144/68 04/26/21 Continue to monitor blood pressure. 05/31/21  Hypertension Management Discussed Take medications as ordered Limit salt intake. Eat plenty of fruits and vegetables Remain as active as possible Take blood pressure at least weekly at home and record in a notebook to take to doctor's visits.          Plan:  Follow-up: Patient agrees to Care Plan and Follow-up. Follow-up in 4-6 week(s) RN CM will send update to PCP.

## 2021-05-31 NOTE — Telephone Encounter (Signed)
Pt called to say she had procedure on 6/27 and was checking to see if her results were available. Then she continued to tell me about her abd pain and her bowel habits. She said that something needed to be done and she thinks its a blockage. She said she couldn't wait until Oct to see Neil Crouch, PA. I moved her OV up with LSL to 7/13 and she is aware. Please call [phone]

## 2021-05-31 NOTE — Patient Instructions (Signed)
Goals Addressed             This Visit's Progress    COMPLETED: Make and Keep All Appointments   On track    Barriers: None Timeframe:  Long-Range Goal Priority:  High Start Date:  11/21/20                           Expected End Date:   08/01/21            Follow Up Date 05/31/21  - ask family or friend for a ride    Why is this important?   Part of staying healthy is seeing the doctor for follow-up care.  If you forget your appointments, there are some things you can do to stay on track.    Notes: Patient to make follow up appointment with surgeon.  02/21/21 Patient saw surgeon on 02/19/21. Visit went well.   03/21/21 Patient seeing physician as scheduled 04/26/21 No transportation problems.      Prevent Falls and Injury   On track    Barriers: Knowledge Timeframe:  Short-Term Goal Priority:  High Start Date:     04/26/21                        Expected End Date: 08/01/21               Follow Up Date 06/30/21   - learn how to get back up if I fall - make an emergency alert plan in case I fall - use a nonslip pad with throw rugs, or remove them completely - use a cane or walker    Why is this important?   Most falls happen when it is hard for you to walk safely. Your balance may be off because of an illness. You may have pain in your knees, hip or other joints.  You may be overly tired or taking medicines that make you sleepy. You may not be able to see or hear clearly.  Falls can lead to broken bones, bruises or other injuries.  There are things you can do to help prevent falling.     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  Patient limited weight bearing to see surgeon 05/07/21.  05/31/21 No more falls since last fall on 04-18-21. Falls Prevention Discussed Keeping moving and stay active Keep your bones strong Go for regular eye checkups Always stand up slowly Wear proper non-slip footwear Light up your living space Install assistive devices.        Track and Manage My Blood Pressure-Hypertension   On track    Barriers: Knowledge Timeframe:  Long-Range Goal Priority:  High Start Date:  11/21/20                           Expected End Date:   12/01/21  Follow Up Date 07/01/21   - check blood pressure weekly - write blood pressure results in a log or diary    Why is this important?   You won't feel high blood pressure, but it can still hurt your blood vessels.  High blood pressure can cause heart or kidney problems. It can also cause a stroke.  Making lifestyle changes like losing a little weight or eating less salt will help.  Checking your blood pressure at home and at different times of the day can help to control blood pressure.  If the doctor prescribes medicine remember to take it the way the doctor ordered.  Call the office if you cannot afford the medicine or if there are questions about it.     Notes: 02/21/21 Patient blood pressure less than 140/80. 03/21/21 Most recent blood pressure 144/68 04/26/21 Continue to monitor blood pressure. 05/31/21  Hypertension Management Discussed Take medications as ordered Limit salt intake. Eat plenty of fruits and vegetables Remain as active as possible Take blood pressure at least weekly at home and record in a notebook to take to doctor's visits.

## 2021-06-01 DIAGNOSIS — F418 Other specified anxiety disorders: Secondary | ICD-10-CM | POA: Insufficient documentation

## 2021-06-01 NOTE — Telephone Encounter (Signed)
Good Morning, Phoned the pt and she wants the results of her procedure. I advised her that the Dr's have 10 days to respond back to [place]. (Pt has already seen the report). She didn't complain to me about her abd pain like with [name]. She said it hurt a little but she was more worried about results and what she can take for her pain. I advised to continue with her PPI for now and I would pass message to the Dr. She states she prefers you call the house phone but if you cant get her, call her cell at [phone]. That's if you don't get to me before we close today.

## 2021-06-05 ENCOUNTER — Encounter (HOSPITAL_COMMUNITY): Payer: Self-pay | Admitting: Internal Medicine

## 2021-06-13 ENCOUNTER — Encounter: Payer: Self-pay | Admitting: Gastroenterology

## 2021-06-13 ENCOUNTER — Other Ambulatory Visit: Payer: Self-pay

## 2021-06-13 ENCOUNTER — Ambulatory Visit: Payer: Medicare HMO | Admitting: Gastroenterology

## 2021-06-13 VITALS — BP 133/82 | HR 67 | Temp 97.3°F | Ht 64.0 in | Wt 134.4 lb

## 2021-06-13 DIAGNOSIS — K2970 Gastritis, unspecified, without bleeding: Secondary | ICD-10-CM

## 2021-06-13 DIAGNOSIS — K5900 Constipation, unspecified: Secondary | ICD-10-CM | POA: Diagnosis not present

## 2021-06-13 DIAGNOSIS — K2990 Gastroduodenitis, unspecified, without bleeding: Secondary | ICD-10-CM

## 2021-06-13 DIAGNOSIS — R109 Unspecified abdominal pain: Secondary | ICD-10-CM

## 2021-06-13 NOTE — Progress Notes (Signed)
Primary Care Physician: [name], MD  Primary Gastroenterologist:  Garfield Cornea, MD   Chief Complaint  Patient presents with   Follow-up    FU from EGD, rt sided abd pain    HPI: [name] is a 80 y.o. female here for follow-up.  She was seen in the office back in May.  She has a history of right-sided abdominal pain.  Since patient was seen last she fell over a root in her yard.  Right prosthetic hip dislocated, EMS took her to the ED.  She underwent reduction in the ED under anesthesia.  She followed up with Dr. Lyla Glassing who felt like hip looked good and advised to come back in September.  Patient continues to have right-sided mid abdominal pain.  If she leans over to the left, her pain starts up again.  She notes that if she stays still and does not move a lot, pain will go away.  After she dislocated her hip and was advised to be nonweight bearing, she felt like her abdominal pain was minimal to none.  She notes that she was receiving weekly injections for abdominal wall pain by Dr. Elonda Husky.  The first 3 injections provided her with relief of her pain but would only last for minutes to hours at a time.  She states her fourth injection included a steroid and she believes that it did help.  Patient states when she followed up the last time with Dr. Elonda Husky she had no pain but the following week it came back.  She typically notices the pain mostly at night.  She notes that if she is preoccupied such as playing cards with her friends, she really does not notice the pain.  She is under a lot of stress at home, husband has Alzheimer's and requires 24-hour care and she has financial stress from pain for this care as well as having someone in her home at all times.  Tries to get out of the house at times to give herself a break.  Completed EGD back in June to follow-up gastric ulcers.  Ulcers had resolved but she did have some gastritis.  She states after her EGD she was instructed to  drop pantoprazole down to once daily.  She has not noticed any change in her abdominal pain.  No reflux.  Abdominal pain not worse with meals.  She is taking ClearLax for her constipation.  Currently having 2 stools daily, very soft.  No melena or rectal bleeding.    EGD 12/2020:  -erosive reflux esophagitis -nonbleeding gastric ulcers, biopsy showed erosion/ulceration, negative for H. Pylori  Colonoscopy January 2022: -Diverticulosis  CT abdomen pelvis February 2022 with subacute impaction of the right subcapital femoral neck, diverticulosis without diverticulitis, absent gallbladder.  Patient was referred to orthopedic.  She had a hip replacement and was told by Ortho that her pain had nothing to do with her hip replacement.  CT abdomen pelvis March 15, 2021 after being referred to the ED from our office for abdominal pain, stable intra and extrahepatic biliary duct dilation likely related to prior cholecystectomy, postsurgical changes seen from interval right hip arthroplasty.  Postoperative seroma anterior to the right hip prosthesis measuring 5.1 x 2.8 x 6.6 cm.   EGD 05/2021: -Gastritis. Biopsied. Reactive gastropathy, Neg for H.pylori -Normal duodenal bulb, first portion of the duodenum and second portion of the duodenum.  Current Outpatient Medications  Medication Sig Dispense Refill   acetaminophen (TYLENOL) 500 MG  tablet Take 500-1,000 mg by mouth as needed for mild pain or moderate pain.     albuterol (VENTOLIN HFA) 108 (90 Base) MCG/ACT inhaler Inhale 2 puffs into the lungs every 6 (six) hours as needed for wheezing or shortness of breath.     ALPRAZolam (XANAX) 0.5 MG tablet Take 0.5 mg by mouth in the morning, at noon, and at bedtime. (1000, 1500, AT BEDTIME)     amLODipine (NORVASC) 2.5 MG tablet Take 2.5 mg by mouth every evening.      aspirin EC 81 MG tablet Take 81 mg by mouth daily. Swallow whole.     chlorhexidine (PERIDEX) 0.12 % solution Use as directed 15 mLs in the  mouth or throat as needed (mouth ulcers).     Cholecalciferol (VITAMIN D3) 50 MCG (2000 UT) capsule Take 2,000 Units by mouth daily.     diclofenac Sodium (VOLTAREN) 1 % GEL Apply 1 application topically as needed (back pain).     fluticasone (FLONASE) 50 MCG/ACT nasal spray Place 2 sprays into both nostrils daily as needed for allergies.     levothyroxine (SYNTHROID) 50 MCG tablet Take 1 tablet (50 mcg total) by mouth daily before breakfast. 90 tablet 3   losartan (COZAAR) 25 MG tablet Take 25 mg by mouth daily.     Multiple Vitamin (MULTIVITAMIN WITH MINERALS) TABS tablet Take 1 tablet by mouth daily in the afternoon.     NONFORMULARY OR COMPOUNDED ITEM Apply 1 application topically as needed (foot neuropathy). neuropathy cream dic 3%/bac 2%/gab %/lid 5%/meth 1%     pantoprazole (PROTONIX) 40 MG tablet Take 1 tablet (40 mg total) by mouth daily. 30 tablet 5   polyethylene glycol (MIRALAX / GLYCOLAX) 17 g packet Take 17 g by mouth daily.     PREVIDENT 5000 BOOSTER PLUS 1.1 % PSTE Place 1 application onto teeth every other day.     raloxifene (EVISTA) 60 MG tablet Take 1 tablet (60 mg total) by mouth every morning. (Patient taking differently: Take 60 mg by mouth every other day. IN THE MORNING) 30 tablet 12   SYMBICORT 80-4.5 MCG/ACT inhaler INHALE 2 PUFFS INTO THE LUNGS TWICE DAILY. (Patient taking differently: Inhale 2 puffs into the lungs daily.) 10.2 g 0   Turmeric Curcumin 500 MG CAPS Take 500 mg by mouth daily.     venlafaxine XR (EFFEXOR-XR) 37.5 MG 24 hr capsule Take 37.5 mg by mouth daily with breakfast.      No current facility-administered medications for this visit.    Allergies as of 06/13/2021 - Review Complete 06/13/2021  Allergen Reaction Noted   Amoxicillin-pot clavulanate Nausea And Vomiting, Other (See Comments), Itching, and Nausea Only 10/04/2013   Ace inhibitors Cough    Entex la Itching    Levofloxacin Hives and Itching 01/21/2017   Meloxicam Other (See Comments)  04/25/2020    ROS:  General: Negative for anorexia, weight loss, fever, chills, fatigue, weakness. ENT: Negative for hoarseness, difficulty swallowing , nasal congestion. CV: Negative for chest pain, angina, palpitations, dyspnea on exertion, peripheral edema.  Respiratory: Negative for dyspnea at rest, dyspnea on exertion, cough, sputum, wheezing.  GI: See history of present illness. GU:  Negative for dysuria, hematuria, urinary incontinence, urinary frequency, nocturnal urination.  Endo: Negative for unusual weight change.    Physical Examination:   BP 133/82   Pulse 67   Temp (!) 97.3 F (36.3 C) (Temporal)   Ht 5\' 4"  (1.626 m)   Wt 134 lb 6.4 oz (61 kg)  BMI 23.07 kg/m   General: Well-nourished, well-developed in no acute distress.  Eyes: No icterus. Mouth: masked Lungs: Clear to auscultation bilaterally.  Heart: Regular rate and rhythm, no murmurs rubs or gallops.  Abdomen: Bowel sounds are normal, nontender, nondistended, no hepatosplenomegaly or masses, no abdominal bruits or hernia , no rebound or guarding.  Patient moved into several bent over positions before she was able to reproduce her right sided abdominal pain, I could not reproduce with palpation.  Extremities: No lower extremity edema. No clubbing or deformities. Neuro: Alert and oriented x 4   Skin: Warm and dry, no jaundice.   Psych: Alert and cooperative, normal mood and affect.  Labs:  Lab Results  Component Value Date   CREATININE 0.66 03/15/2021   BUN 10 03/15/2021   NA 139 03/15/2021   K 3.3 (L) 03/15/2021   CL 102 03/15/2021   CO2 25 03/15/2021   Lab Results  Component Value Date   ALT 15 03/15/2021   AST 24 03/15/2021   ALKPHOS 69 03/15/2021   BILITOT 0.4 03/15/2021   Lab Results  Component Value Date   WBC 9.3 03/15/2021   HGB 11.5 (L) 03/15/2021   HCT 36.5 03/15/2021   MCV 97.9 03/15/2021   PLT 199 03/15/2021     Imaging Studies: No results found.   Assessment: 80 year old  female with history of constipation, right-sided abdominal pain, previous 20 pound weight loss in 2021 but has stabilized over the past 8 months, discovered to have erosive reflux esophagitis and gastric ulcers back in January 2022, presenting for follow-up.    Gastric ulcers: Recent EGD in June with documented healing.  Persisting gastritis.  Currently on PPI daily.  Advised to avoid NSAIDs.  Constipation: Adequate control on MiraLAX equivalent.  Right-sided abdominal pain: Patient reports that this pain began 2 weeks after her hip replacement surgery March 2022.  She states her current pain is different than the pain she developed related to right hip fracture.  Pain started after physical therapy which began 2 weeks postop.  Has been seeing Dr. Elonda Husky and undergoing abdominal wall injections with short-lived relief of her pain.  Since her last visit she fell and dislocated her prosthetic right hip, reduced in the ED under anesthesia.  Per patient, Dr. Lyla Glassing was pleased with hip reduction results, she follows up with him in 2 months.  Patient is quite frustrated today.  She states that Dr. Lyla Glassing does not feel her abdominal pain is related to her hip.  Her pain is definitely aggravated by movement, not by meals or BMs.  Seems to have short-lived relief with abdominal wall injections.  Suspect nerve or musculoskeletal pain.  Fully aggravated by surgery/physical therapy.  At this time I am not sure that repeat imaging would be helpful.  Suggest patient try lidocaine patches but she did not find these beneficial in the past for her back pain.  We did discuss possibility of using Neurontin for amitriptyline but given Xanax use 3 times daily, Effexor daily, I would be more reluctant in using amitriptyline in this nice elderly lady.  Also offered referral to tertiary care center/GI for second opinion.  Patient not interested.  To discuss further with Dr. Gala Romney.    Plan: F/U with Dr. Elonda Husky to discuss if any  additional abdominal wall injections would be advised.  Continue pantoprazole one daily before breakfast.  Continue miralax equivalent one capful daily. Can add a second dose every other day if needed.  To discuss further with  Dr. Gala Romney. Further recommendations to follow.

## 2021-06-13 NOTE — Patient Instructions (Signed)
Please follow up with Dr. Elonda Husky to discuss abdominal wall pain and possible further injections.  Continue pantoprazole once daily for stomach inflammation. Continue miralax once daily. Can take twice daily every other day if needed to keep bowel movements adequate.  I will discuss your case with Dr. Gala Romney and be in touch with any additional recommendations.

## 2021-06-14 ENCOUNTER — Telehealth: Payer: Self-pay | Admitting: Obstetrics & Gynecology

## 2021-06-14 DIAGNOSIS — K2970 Gastritis, unspecified, without bleeding: Secondary | ICD-10-CM | POA: Insufficient documentation

## 2021-06-14 NOTE — Telephone Encounter (Signed)
Pt states GI office was going to call to speak with Dr. Elonda Husky to recommend that the patient get more shots in her stomach  Please advise & call pt - OK to [facility]

## 2021-06-14 NOTE — Telephone Encounter (Signed)
If she is having issues she can call and make an appt to see me

## 2021-06-15 DIAGNOSIS — E782 Mixed hyperlipidemia: Secondary | ICD-10-CM | POA: Insufficient documentation

## 2021-06-15 DIAGNOSIS — E119 Type 2 diabetes mellitus without complications: Secondary | ICD-10-CM | POA: Insufficient documentation

## 2021-06-15 NOTE — Telephone Encounter (Signed)
Pt wants to see Dr. Elonda Husky. Call transferred to [facility] for appt. [place]

## 2021-06-18 DIAGNOSIS — E119 Type 2 diabetes mellitus without complications: Secondary | ICD-10-CM | POA: Diagnosis not present

## 2021-06-18 DIAGNOSIS — E782 Mixed hyperlipidemia: Secondary | ICD-10-CM | POA: Diagnosis not present

## 2021-06-20 ENCOUNTER — Other Ambulatory Visit (HOSPITAL_COMMUNITY): Payer: Self-pay | Admitting: *Deleted

## 2021-06-20 ENCOUNTER — Other Ambulatory Visit (HOSPITAL_COMMUNITY): Payer: Self-pay | Admitting: Student

## 2021-06-20 DIAGNOSIS — M8580 Other specified disorders of bone density and structure, unspecified site: Secondary | ICD-10-CM | POA: Insufficient documentation

## 2021-06-20 DIAGNOSIS — E782 Mixed hyperlipidemia: Secondary | ICD-10-CM | POA: Diagnosis not present

## 2021-06-20 DIAGNOSIS — F5104 Psychophysiologic insomnia: Secondary | ICD-10-CM | POA: Insufficient documentation

## 2021-06-20 DIAGNOSIS — M545 Low back pain, unspecified: Secondary | ICD-10-CM | POA: Insufficient documentation

## 2021-06-20 DIAGNOSIS — E876 Hypokalemia: Secondary | ICD-10-CM | POA: Diagnosis not present

## 2021-06-20 DIAGNOSIS — R600 Localized edema: Secondary | ICD-10-CM | POA: Diagnosis not present

## 2021-06-20 DIAGNOSIS — E039 Hypothyroidism, unspecified: Secondary | ICD-10-CM | POA: Insufficient documentation

## 2021-06-20 DIAGNOSIS — E119 Type 2 diabetes mellitus without complications: Secondary | ICD-10-CM | POA: Diagnosis not present

## 2021-06-20 DIAGNOSIS — K219 Gastro-esophageal reflux disease without esophagitis: Secondary | ICD-10-CM | POA: Diagnosis not present

## 2021-06-20 DIAGNOSIS — M79605 Pain in left leg: Secondary | ICD-10-CM

## 2021-06-20 DIAGNOSIS — Z0001 Encounter for general adult medical examination with abnormal findings: Secondary | ICD-10-CM | POA: Diagnosis not present

## 2021-06-20 DIAGNOSIS — F418 Other specified anxiety disorders: Secondary | ICD-10-CM | POA: Diagnosis not present

## 2021-06-20 DIAGNOSIS — I10 Essential (primary) hypertension: Secondary | ICD-10-CM | POA: Diagnosis not present

## 2021-06-20 DIAGNOSIS — F329 Major depressive disorder, single episode, unspecified: Secondary | ICD-10-CM | POA: Insufficient documentation

## 2021-06-27 ENCOUNTER — Other Ambulatory Visit: Payer: Self-pay

## 2021-06-27 ENCOUNTER — Ambulatory Visit (HOSPITAL_COMMUNITY)
Admission: RE | Admit: 2021-06-27 | Discharge: 2021-06-27 | Disposition: A | Discharge status: Home / Self Care | Payer: Medicare HMO | Source: Ambulatory Visit | Attending: Student | Admitting: Student

## 2021-06-27 DIAGNOSIS — M79605 Pain in left leg: Secondary | ICD-10-CM | POA: Diagnosis not present

## 2021-06-27 DIAGNOSIS — M79662 Pain in left lower leg: Secondary | ICD-10-CM | POA: Diagnosis not present

## 2021-06-27 DIAGNOSIS — R600 Localized edema: Secondary | ICD-10-CM | POA: Diagnosis not present

## 2021-06-27 DIAGNOSIS — Z87891 Personal history of nicotine dependence: Secondary | ICD-10-CM | POA: Diagnosis not present

## 2021-06-27 NOTE — Patient Outreach (Signed)
[facility]) Care Management  06/27/2021  [name] 1941-11-21 [id number]   Telephone call to patient for disease management follow up.   No answer.  Unable to leave a message.    Plan: If no return call, RN CM will attempt patient again in September.  Jone Baseman, RN, MSN [facility] Care Management Coordinator Direct Line [phone] Cell [phone] Toll Free: [phone]  Fax: [phone]

## 2021-07-03 ENCOUNTER — Other Ambulatory Visit: Payer: Self-pay

## 2021-07-03 ENCOUNTER — Encounter: Payer: Self-pay | Admitting: Obstetrics & Gynecology

## 2021-07-03 ENCOUNTER — Ambulatory Visit (INDEPENDENT_AMBULATORY_CARE_PROVIDER_SITE_OTHER): Payer: Medicare HMO | Admitting: Obstetrics & Gynecology

## 2021-07-03 VITALS — BP 138/84 | HR 71 | Ht 64.0 in | Wt 134.5 lb

## 2021-07-03 DIAGNOSIS — G5781 Other specified mononeuropathies of right lower limb: Secondary | ICD-10-CM

## 2021-07-03 NOTE — Progress Notes (Signed)
Trigger Point Injection   Running 4-5 on the analog pain scale  Pre-operative diagnosis:    ICD-10-CM   1. Neuropathy, iliohypogastric nerve, right, chronic  G57.81        Post-operative diagnosis: same  After risks and benefits were explained including bleeding, infection, worsening of the pain, damage to the area being injected, weakness, allergic reaction to medications, vascular injection, and nerve damage, signed consent was obtained.  All questions were answered.    The area of the trigger point was identified and the skin prepped three times with alcohol and the alcohol allowed to dry.  Next, a 25 gauge 1.5 inch needle was placed in the area of the trigger point.  Once reproduction of the pain was elicited and negative aspiration confirmed, the trigger point was injected and the needle removed.    The patient did tolerate the procedure well and there were not complications.    Medication used: 10 cc 0.75% marcaine 12 mg betamethasone  Trigger points injected: 1    Trigger point(s) location(s):  right T10 at junction transverse and rectus abdominus

## 2021-07-16 ENCOUNTER — Ambulatory Visit: Payer: Medicare HMO | Admitting: Gastroenterology

## 2021-07-19 DIAGNOSIS — F411 Generalized anxiety disorder: Secondary | ICD-10-CM | POA: Diagnosis not present

## 2021-07-19 DIAGNOSIS — K5900 Constipation, unspecified: Secondary | ICD-10-CM | POA: Diagnosis not present

## 2021-07-19 DIAGNOSIS — F418 Other specified anxiety disorders: Secondary | ICD-10-CM | POA: Diagnosis not present

## 2021-07-19 DIAGNOSIS — R109 Unspecified abdominal pain: Secondary | ICD-10-CM | POA: Diagnosis not present

## 2021-07-31 ENCOUNTER — Encounter: Payer: Self-pay | Admitting: Internal Medicine

## 2021-07-31 ENCOUNTER — Other Ambulatory Visit: Payer: Self-pay

## 2021-07-31 ENCOUNTER — Ambulatory Visit: Payer: Medicare HMO | Admitting: Internal Medicine

## 2021-07-31 VITALS — BP 147/93 | HR 84 | Temp 97.0°F | Ht 64.0 in | Wt 132.4 lb

## 2021-07-31 DIAGNOSIS — R1031 Right lower quadrant pain: Secondary | ICD-10-CM | POA: Diagnosis not present

## 2021-07-31 DIAGNOSIS — K219 Gastro-esophageal reflux disease without esophagitis: Secondary | ICD-10-CM

## 2021-07-31 DIAGNOSIS — K5900 Constipation, unspecified: Secondary | ICD-10-CM

## 2021-07-31 NOTE — Patient Instructions (Addendum)
It was good seeing you again today  Continue using MiraLAX and stool softeners every day as this is promoting good bowel function  Continue pantoprazole or Protonix 40 mg every day for acid reflux disease  As discussed, your abdominal pain is arising from your abdominal wall.  I think it has nothing to do with your recent hip surgery  He would be best served by seeing a pain management specialist.  We will refer you to Dr. Merlene Laughter for further management and evaluation.  Office visit back with [name] in 6 months for further evaluation of right lower quadrant abdominal pain which she states she has had for about 6 months.  Underwent surgery for hip fracture earlier this year and was treated for a dislocated prosthetic hip in the ED subsequent to that after a fall.  States that around this time she started having right lower quadrant abdominal pain.

## 2021-07-31 NOTE — Progress Notes (Signed)
Primary Care Physician:  [name], MD Primary Gastroenterologist:  Dr. Gala Romney  Pre-Procedure History & Physical: HPI:  [name] is a 80 y.o. female here for further evaluation of right lower quadrant abdominal pain which she states she has had for about 6 months.  Underwent surgery for hip fracture earlier this year and was treated for a dislocated prosthetic hip in the ED subsequent to that after a fall.  States that around this time, she started having right lower quadrant abdominal pain.Marland Kitchen  Positional component.  Not associate with bowel function.  No associated nausea orvomiting.  Reflux symptoms well controlled on pantoprazole.  Constipation managed well with MiraLAX 17 g orally taken every day.  Has not had any melena or rectal bleeding.  Work-up thus far has included CT scans in February and April of this year.  EGD reactive gastropathy negative for H. pylori on histology. It is notable that the patient has received abdominal wall lidocaine/beclomethasone injections by Dr. Elonda Husky  She has had 3.  She gets complete relief of the symptoms  - but only lasts about 24 hours and then discomfort returns.   She is also tried Lidoderm patches without much improvement in her discomfort. Colonoscopy in January of this year demonstrated only diverticulosis. She has been under a lot of stress caring for her husband who has a later stage Alzheimer's disease.   Past Medical History:  Diagnosis Date   Anemia    Anxiety    Arthritis    Asthma    Constipation    GERD (gastroesophageal reflux disease)    High blood pressure    History of COVID-19 12/28/2020   Hypothyroidism    PAD (peripheral artery disease) (HCC)    bilateral legs   Peripheral edema    Peripheral neuropathy    bilateral feet at night occ   Psoriasis    Seasonal allergies    Skin cancer    Left eye brow   Varicose veins    Vertigo 11/23/2013    Past Surgical History:  Procedure Laterality Date   APPENDECTOMY      patient unsure   BIOPSY  12/21/2020   Procedure: BIOPSY;  Surgeon: Daneil Dolin, MD;  Location: [facility];  Service: Endoscopy;;   BIOPSY  05/28/2021   Procedure: BIOPSY;  Surgeon: Eloise Harman, DO;  Location: [facility];  Service: Endoscopy;;   CATARACT EXTRACTION W/PHACO Left 01/27/2017   Procedure: CATARACT EXTRACTION PHACO AND INTRAOCULAR LENS PLACEMENT ([place]);  Surgeon: Tonny Branch, MD;  Location: [facility];  Service: Ophthalmology;  Laterality: Left;  CDE: 29.90   CATARACT EXTRACTION W/PHACO Right 02/24/2017   Procedure: CATARACT EXTRACTION PHACO AND INTRAOCULAR LENS PLACEMENT ([facility]);  Surgeon: Tonny Branch, MD;  Location: [facility];  Service: Ophthalmology;  Laterality: Right;  CDE: 8.57   CHOLECYSTECTOMY     COLONOSCOPY N/A 10/01/2016   Procedure: COLONOSCOPY;  Surgeon: Daneil Dolin, MD; pancolonic diverticulosis, melanosis coli, otherwise normal exam.  No recommendations to repeat colonoscopy.   COLONOSCOPY WITH PROPOFOL N/A 12/21/2020   Kyarah Enamorado: Diverticulosis   ESOPHAGOGASTRODUODENOSCOPY (EGD) WITH PROPOFOL N/A 12/21/2020   Chereese Cilento: Erosive reflux esophagitis, nonbleeding gastric ulcers with biopsy showing erosion/ulceration, negative for H. pylori.   ESOPHAGOGASTRODUODENOSCOPY (EGD) WITH PROPOFOL N/A 05/28/2021   Carver: gastritis (reactive gastropathy), no H.pylori   FLEXOR TENDON REPAIR Right 11/06/2017   Procedure: RIGHT WRIST FLEXOR TENDON REPAIRAND STT DEBRIEDMENT;  Surgeon: Leanora Cover, MD;  Location: [place];  Service:  Orthopedics;  Laterality: Right;   MOUTH SURGERY     artery bleed   TOTAL HIP ARTHROPLASTY Right 01/31/2021   Procedure: TOTAL HIP ARTHROPLASTY ANTERIOR APPROACH;  Surgeon: Rod Can, MD;  Location: [facility];  Service: Orthopedics;  Laterality: Right;   TUBAL LIGATION      Prior to Admission medications   Medication Sig Start Date End Date Taking? Authorizing Provider  acetaminophen (TYLENOL) 500 MG tablet Take 500-1,000 mg by  mouth as needed for mild pain or moderate pain.   Yes [provider]  albuterol (VENTOLIN HFA) 108 (90 Base) MCG/ACT inhaler Inhale 2 puffs into the lungs every 6 (six) hours as needed for wheezing or shortness of breath.   Yes [provider]  ALPRAZolam (XANAX) 0.5 MG tablet Take 0.5 mg by mouth in the morning, at noon, and at bedtime. (1000, 1500, AT BEDTIME)   Yes [provider]  amLODipine (NORVASC) 2.5 MG tablet Take 2.5 mg by mouth every evening.  02/09/13  Yes [provider]  aspirin EC 81 MG tablet Take 81 mg by mouth daily. Swallow whole.   Yes [provider]  chlorhexidine (PERIDEX) 0.12 % solution Use as directed 15 mLs in the mouth or throat as needed (mouth ulcers).   Yes [provider]  Cholecalciferol (VITAMIN D3) 50 MCG (2000 UT) capsule Take 2,000 Units by mouth daily.   Yes [provider]  diclofenac Sodium (VOLTAREN) 1 % GEL Apply 1 application topically as needed (back pain).   Yes [provider]  fluticasone (FLONASE) 50 MCG/ACT nasal spray Place 2 sprays into both nostrils daily as needed for allergies.   Yes [name], MD  levothyroxine (SYNTHROID) 50 MCG tablet Take 1 tablet (50 mcg total) by mouth daily before breakfast. 05/21/21  Yes Brita Romp, NP  losartan (COZAAR) 25 MG tablet Take 25 mg by mouth daily. 07/14/20  Yes [provider]  Multiple Vitamin (MULTIVITAMIN WITH MINERALS) TABS tablet Take 1 tablet by mouth daily in the afternoon.   Yes [provider]  NONFORMULARY OR COMPOUNDED ITEM Apply 1 application topically as needed (foot neuropathy). neuropathy cream dic 3%/bac 2%/gab %/lid 5%/meth 1%   Yes [provider]  pantoprazole (PROTONIX) 40 MG tablet Take 1 tablet (40 mg total) by mouth daily. 05/28/21 11/24/21 Yes Carver, Elon Alas, DO  polyethylene glycol (MIRALAX / GLYCOLAX) 17 g packet Take 17 g by mouth daily.   Yes [provider]   PREVIDENT 5000 BOOSTER PLUS 1.1 % PSTE Place 1 application onto teeth every other day. 09/28/20  Yes [provider]  raloxifene (EVISTA) 60 MG tablet Take 1 tablet (60 mg total) by mouth every morning. Patient taking differently: Take 60 mg by mouth every other day. IN THE MORNING 04/10/20  Yes Estill Dooms, NP  SYMBICORT 80-4.5 MCG/ACT inhaler INHALE 2 PUFFS INTO THE LUNGS TWICE DAILY. Patient taking differently: Inhale 2 puffs into the lungs daily. 02/02/18  Yes Tanda Rockers, MD  Turmeric Curcumin 500 MG CAPS Take 500 mg by mouth daily.   Yes [provider]  venlafaxine XR (EFFEXOR-XR) 75 MG 24 hr capsule venlafaxine ER 75 mg capsule,extended release 24 hr   Yes [provider]    Allergies as of 07/31/2021 - Review Complete 07/31/2021  Allergen Reaction Noted   Amoxicillin-pot clavulanate Nausea And Vomiting, Other (See Comments), Itching, and Nausea Only 10/04/2013   Ace inhibitors Cough    Entex la Itching    Levofloxacin  Hives and Itching 01/21/2017   Meloxicam Other (See Comments) 04/25/2020    Family History  Problem Relation Age of Onset   Hypertension Mother    Varicose Veins Father    Heart attack Father    Heart disease Father    Hypertension Brother    COPD Brother     Social History   Socioeconomic History   Marital status: Married    Spouse name: Not on file   Number of children: Not on file   Years of education: Not on file   Highest education level: Not on file  Occupational History   Not on file  Tobacco Use   Smoking status: Former    Packs/day: 0.25    Years: 5.00    Pack years: 1.25    Types: Cigarettes    Quit date: 12/02/1997    Years since quitting: 23.6   Smokeless tobacco: Never  Vaping Use   Vaping Use: Never used  Substance and Sexual Activity   Alcohol use: Not Currently    Alcohol/week: 1.0 standard drink    Types: 1 Glasses of wine per week   Drug use: No   Sexual activity: Not Currently    Birth  control/protection: Surgical    Comment: tubal  Other Topics Concern   Not on file  Social History Narrative   Not on file   Social Determinants of Health   Financial Resource Strain: Not on file  Food Insecurity: No Food Insecurity   Worried About Charity fundraiser in the Last Year: Never true   Ran Out of Food in the Last Year: Never true  Transportation Needs: No Transportation Needs   Lack of Transportation (Medical): No   Lack of Transportation (Non-Medical): No  Physical Activity: Not on file  Stress: Not on file  Social Connections: Not on file  Intimate Partner Violence: Not on file    Review of Systems: See HPI, otherwise negative ROS  Physical Exam: BP (!) 147/93   Pulse 84   Temp (!) 97 F (36.1 C)   Ht '5\' 4"'$  (1.626 m)   Wt 132 lb 6.4 oz (60.1 kg)   BMI 22.73 kg/m  General:   Alert,   pleasant and cooperative in NAD Lungs:  Clear throughout to auscultation.   No wheezes, crackles, or rhonchi. No acute distress. Heart:  Regular rate and rhythm; no murmurs, clicks, rubs,  or gallops. Abdomen: Non-distended, normal bowel sounds.  Vague localized right lower quadrant tenderness to palpation.  No appreciable mass or fascial defect appreciated.  Positive Carnett's sign. Pulses:  Normal pulses noted. Extremities:  Without clubbing or edema. Rectal: No external abnormalities.  Scant brown stool in the rectal vault.  No masses.  Stool is Hemoccult negative.  Impression/Plan: 79 year old lady with right lower quadrant abdominal pain.  It has a positional component.  Pain likely emanating from her abdominal wall given physical exam (positive Carnett's sign), lack of findings on CT and very positive response to local steroid/anesthetic injections. These findings make a radicular origin or some other occult pathology such as a spigelian hernia much less likely.  An anterior cutaneous nerve entrapment may well be playing a role here.  Onset of symptoms in relation to her  recent hip problems is most likely coincidental. I do not feel further diagnostic studies are warranted. She should see a pain management specialist.  GERD well-controlled on Protonix without any alarm features.  A future colonoscopy is not recommended unless new symptoms arise.  Recommendations:  Continue using MiraLAX and stool softeners every day as this is promoting good bowel function  Continue pantoprazole or Protonix 40 mg every day for acid reflux disease  As discussed,  abdominal pain is arising from  abdominal wall.  It is felt not to have anything to do with your recent hip surgery  You would be best served by seeing a pain management specialist.  At patient's request, we will refer to Dr. Merlene Laughter for further management and evaluation.       Notice: This dictation was prepared with Dragon dictation along with smaller phrase technology. Any transcriptional errors that result from this process are unintentional and may not be corrected upon review.

## 2021-08-02 ENCOUNTER — Other Ambulatory Visit: Payer: Self-pay

## 2021-08-02 DIAGNOSIS — R1031 Right lower quadrant pain: Secondary | ICD-10-CM

## 2021-08-08 ENCOUNTER — Other Ambulatory Visit: Payer: Self-pay

## 2021-08-08 NOTE — Patient Outreach (Signed)
[facility]) Care Management  08/08/2021  [name] Jul 26, 1941 [id number]   Telephone call to patient for disease management follow up. Spoke with patient she states this is not a good time.   Plan: RN CM will attempt again in the month of November, if no return call.   Jone Baseman, RN, MSN [place] Management Care Management Coordinator Direct Line [phone] Cell [phone] Toll Free: [phone]  Fax: [phone]

## 2021-08-17 ENCOUNTER — Ambulatory Visit: Payer: Self-pay

## 2021-09-07 DIAGNOSIS — Z1231 Encounter for screening mammogram for malignant neoplasm of breast: Secondary | ICD-10-CM | POA: Diagnosis not present

## 2021-09-07 DIAGNOSIS — Z780 Asymptomatic menopausal state: Secondary | ICD-10-CM | POA: Diagnosis not present

## 2021-09-11 ENCOUNTER — Ambulatory Visit: Payer: Medicare HMO | Admitting: Gastroenterology

## 2021-09-12 ENCOUNTER — Encounter: Payer: Self-pay | Admitting: Adult Health

## 2021-09-13 ENCOUNTER — Encounter: Payer: Self-pay | Admitting: Adult Health

## 2021-09-18 DIAGNOSIS — I10 Essential (primary) hypertension: Secondary | ICD-10-CM | POA: Diagnosis not present

## 2021-09-18 DIAGNOSIS — E119 Type 2 diabetes mellitus without complications: Secondary | ICD-10-CM | POA: Diagnosis not present

## 2021-09-19 DIAGNOSIS — E039 Hypothyroidism, unspecified: Secondary | ICD-10-CM | POA: Diagnosis not present

## 2021-09-20 ENCOUNTER — Ambulatory Visit: Payer: Medicare HMO | Admitting: Nurse Practitioner

## 2021-09-20 DIAGNOSIS — R2689 Other abnormalities of gait and mobility: Secondary | ICD-10-CM | POA: Insufficient documentation

## 2021-09-20 DIAGNOSIS — Z23 Encounter for immunization: Secondary | ICD-10-CM | POA: Diagnosis not present

## 2021-09-20 DIAGNOSIS — K219 Gastro-esophageal reflux disease without esophagitis: Secondary | ICD-10-CM | POA: Diagnosis not present

## 2021-09-20 DIAGNOSIS — E876 Hypokalemia: Secondary | ICD-10-CM | POA: Diagnosis not present

## 2021-09-20 DIAGNOSIS — R600 Localized edema: Secondary | ICD-10-CM | POA: Diagnosis not present

## 2021-09-20 DIAGNOSIS — E119 Type 2 diabetes mellitus without complications: Secondary | ICD-10-CM | POA: Diagnosis not present

## 2021-09-20 DIAGNOSIS — Z0001 Encounter for general adult medical examination with abnormal findings: Secondary | ICD-10-CM | POA: Diagnosis not present

## 2021-09-20 DIAGNOSIS — F418 Other specified anxiety disorders: Secondary | ICD-10-CM | POA: Diagnosis not present

## 2021-09-20 DIAGNOSIS — E782 Mixed hyperlipidemia: Secondary | ICD-10-CM | POA: Diagnosis not present

## 2021-09-20 DIAGNOSIS — I10 Essential (primary) hypertension: Secondary | ICD-10-CM | POA: Diagnosis not present

## 2021-09-20 LAB — T4, FREE: Free T4: 1.09 ng/dL (ref 0.82–1.77)

## 2021-09-20 LAB — TSH: TSH: 2.67 u[IU]/mL (ref 0.450–4.500)

## 2021-09-21 ENCOUNTER — Encounter: Payer: Self-pay | Admitting: Adult Health

## 2021-09-26 ENCOUNTER — Other Ambulatory Visit: Payer: Self-pay

## 2021-09-26 ENCOUNTER — Ambulatory Visit: Payer: Medicare HMO | Admitting: Nurse Practitioner

## 2021-09-26 ENCOUNTER — Encounter: Payer: Self-pay | Admitting: Nurse Practitioner

## 2021-09-26 VITALS — BP 126/80 | HR 74 | Ht 64.0 in | Wt 136.6 lb

## 2021-09-26 DIAGNOSIS — E039 Hypothyroidism, unspecified: Secondary | ICD-10-CM | POA: Diagnosis not present

## 2021-09-26 NOTE — Progress Notes (Signed)
Endocrinology Follow Up Visit                                        09/26/2021, 11:28 AM     SUBJECTIVE:  [name] is a 80 y.o.-year-old female patient being seen in follow up after being seen in consultation for hypothyroidism referred by [name], MD.   Past Medical History:  Diagnosis Date   Anemia    Anxiety    Arthritis    Asthma    Constipation    GERD (gastroesophageal reflux disease)    High blood pressure    History of COVID-19 12/28/2020   Hypothyroidism    PAD (peripheral artery disease) (HCC)    bilateral legs   Peripheral edema    Peripheral neuropathy    bilateral feet at night occ   Psoriasis    Seasonal allergies    Skin cancer    Left eye brow   Varicose veins    Vertigo 11/23/2013    Past Surgical History:  Procedure Laterality Date   APPENDECTOMY     patient unsure   BIOPSY  12/21/2020   Procedure: BIOPSY;  Surgeon: Daneil Dolin, MD;  [facility]: [facility];  Service: Endoscopy;;   BIOPSY  05/28/2021   Procedure: BIOPSY;  Surgeon: Eloise Harman, DO;  [facility];  Service: Endoscopy;;   CATARACT EXTRACTION W/PHACO Left 01/27/2017   Procedure: CATARACT EXTRACTION PHACO AND INTRAOCULAR LENS PLACEMENT ([place]);  Surgeon: Tonny Branch, MD;  Location: [facility];  Service: Ophthalmology;  Laterality: Left;  CDE: 29.90   CATARACT EXTRACTION W/PHACO Right 02/24/2017   Procedure: CATARACT EXTRACTION PHACO AND INTRAOCULAR LENS PLACEMENT ([facility]);  Surgeon: Tonny Branch, MD;  Location: [facility];  Service: Ophthalmology;  Laterality: Right;  CDE: 8.57   CHOLECYSTECTOMY     COLONOSCOPY N/A 10/01/2016   Procedure: COLONOSCOPY;  Surgeon: Daneil Dolin, MD; pancolonic diverticulosis, melanosis coli, otherwise normal exam.  No recommendations to repeat colonoscopy.   COLONOSCOPY WITH PROPOFOL N/A 12/21/2020   Rourk: Diverticulosis   ESOPHAGOGASTRODUODENOSCOPY (EGD) WITH PROPOFOL  N/A 12/21/2020   Rourk: Erosive reflux esophagitis, nonbleeding gastric ulcers with biopsy showing erosion/ulceration, negative for H. pylori.   ESOPHAGOGASTRODUODENOSCOPY (EGD) WITH PROPOFOL N/A 05/28/2021   Carver: gastritis (reactive gastropathy), no H.pylori   FLEXOR TENDON REPAIR Right 11/06/2017   Procedure: RIGHT WRIST FLEXOR TENDON REPAIRAND STT DEBRIEDMENT;  Surgeon: Leanora Cover, MD;  Location: [place];  Service: Orthopedics;  Laterality: Right;   MOUTH SURGERY     artery bleed   TOTAL HIP ARTHROPLASTY Right 01/31/2021   Procedure: TOTAL HIP ARTHROPLASTY ANTERIOR APPROACH;  Surgeon: Rod Can, MD;  Location: [facility];  Service: Orthopedics;  Laterality: Right;   TUBAL LIGATION      Social History   Socioeconomic History   Marital status: Married    Spouse name: Not on file   Number of children: Not on file   Years of education: Not on file   Highest education level: Not on file  Occupational History  Not on file  Tobacco Use   Smoking status: Former    Packs/day: 0.25    Years: 5.00    Pack years: 1.25    Types: Cigarettes    Quit date: 12/02/1997    Years since quitting: 23.8   Smokeless tobacco: Never  Vaping Use   Vaping Use: Never used  Substance and Sexual Activity   Alcohol use: Not Currently    Alcohol/week: 1.0 standard drink    Types: 1 Glasses of wine per week   Drug use: No   Sexual activity: Not Currently    Birth control/protection: Surgical    Comment: tubal  Other Topics Concern   Not on file  Social History Narrative   Not on file   Social Determinants of Health   Financial Resource Strain: Not on file  Food Insecurity: No Food Insecurity   Worried About Charity fundraiser in the Last Year: Never true   Ran Out of Food in the Last Year: Never true  Transportation Needs: No Transportation Needs   Lack of Transportation (Medical): No   Lack of Transportation (Non-Medical): No  Physical Activity: Not on file   Stress: Not on file  Social Connections: Not on file    Family History  Problem Relation Age of Onset   Hypertension Mother    Varicose Veins Father    Heart attack Father    Heart disease Father    Hypertension Brother    COPD Brother     Outpatient Encounter Medications as of 09/26/2021  Medication Sig   acetaminophen (TYLENOL) 500 MG tablet Take 500-1,000 mg by mouth as needed for mild pain or moderate pain.   albuterol (VENTOLIN HFA) 108 (90 Base) MCG/ACT inhaler Inhale 2 puffs into the lungs every 6 (six) hours as needed for wheezing or shortness of breath.   ALPRAZolam (XANAX) 0.5 MG tablet Take 0.5 mg by mouth in the morning, at noon, and at bedtime. (1000, 1500, AT BEDTIME)   amLODipine (NORVASC) 2.5 MG tablet Take 2.5 mg by mouth every evening.    aspirin EC 81 MG tablet Take 81 mg by mouth daily. Swallow whole.   chlorhexidine (PERIDEX) 0.12 % solution Use as directed 15 mLs in the mouth or throat as needed (mouth ulcers).   Cholecalciferol (VITAMIN D3) 50 MCG (2000 UT) capsule Take 2,000 Units by mouth daily.   diclofenac Sodium (VOLTAREN) 1 % GEL Apply 1 application topically as needed (back pain).   fluticasone (FLONASE) 50 MCG/ACT nasal spray Place 2 sprays into both nostrils daily as needed for allergies.   levothyroxine (SYNTHROID) 50 MCG tablet Take 1 tablet (50 mcg total) by mouth daily before breakfast.   losartan (COZAAR) 25 MG tablet Take 25 mg by mouth daily.   Multiple Vitamin (MULTIVITAMIN WITH MINERALS) TABS tablet Take 1 tablet by mouth daily in the afternoon.   mupirocin ointment (BACTROBAN) 2 % Apply topically as needed.   NONFORMULARY OR COMPOUNDED ITEM Apply 1 application topically as needed (foot neuropathy). neuropathy cream dic 3%/bac 2%/gab %/lid 5%/meth 1%   pantoprazole (PROTONIX) 40 MG tablet Take 1 tablet (40 mg total) by mouth daily.   polyethylene glycol (MIRALAX / GLYCOLAX) 17 g packet Take 17 g by mouth daily.   PREVIDENT 5000 BOOSTER PLUS  1.1 % PSTE Place 1 application onto teeth every other day.   raloxifene (EVISTA) 60 MG tablet Take 1 tablet (60 mg total) by mouth every morning. (Patient taking differently: Take 60 mg by mouth every other  day. IN THE MORNING)   SYMBICORT 80-4.5 MCG/ACT inhaler INHALE 2 PUFFS INTO THE LUNGS TWICE DAILY. (Patient taking differently: Inhale 2 puffs into the lungs daily.)   Turmeric Curcumin 500 MG CAPS Take 500 mg by mouth daily.   venlafaxine XR (EFFEXOR-XR) 75 MG 24 hr capsule venlafaxine ER 75 mg capsule,extended release 24 hr   No facility-administered encounter medications on file as of 09/26/2021.    ALLERGIES: Allergies  Allergen Reactions   Amoxicillin-Pot Clavulanate Nausea And Vomiting, Other (See Comments), Itching and Nausea Only    Has patient had a PCN reaction causing immediate rash, facial/tongue/throat swelling, SOB or lightheadedness with hypotension: No Has patient had a PCN reaction causing severe rash involving mucus membranes or skin necrosis: No Has patient had a PCN reaction that required hospitalization: No Has patient had a PCN reaction occurring within the last 10 years: No If all of the above answers are "NO", then may proceed with Cephalosporin use. Has patient had a PCN reaction causing immediate rash, facial/tongue/throat swelling, SOB or lightheadedness with hypotension: No Has patient had a PCN reaction causing severe rash involving mucus membranes or skin necrosis: No Has patient had a PCN reaction that required hospitalization: No Has patient had a PCN reaction occurring within the last 10 years: No If all of the above answers are "NO", then may proceed with Cephalosporin use.    Ace Inhibitors Cough   Entex La Itching   Levofloxacin Hives and Itching   Meloxicam Other (See Comments)    (MOBIC) Unknown reaction type (maybe nausea?)   VACCINATION STATUS: Immunization History  Administered Date(s) Administered   Influenza Whole 09/01/2016   Influenza,  High Dose Seasonal PF 09/11/2018   Influenza-Unspecified 08/02/2013, 08/29/2015   PFIZER(Purple Top)SARS-COV-2 Vaccination 12/16/2019, 01/17/2020, 09/28/2020     HPI  Thyroid Problem Presents for follow-up visit. Symptoms include anxiety. Patient reports no cold intolerance, constipation, depressed mood, fatigue, heat intolerance, palpitations, tremors, weight gain or weight loss. The symptoms have been improving.   [name]  is a patient with the above medical history.  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    I reviewed patient's thyroid tests:  Lab Results  Component Value Date   TSH 2.670 09/19/2021   TSH 2.040 05/14/2021   TSH 6.350 (H) 02/08/2021   TSH 2.39 09/28/2020   TSH 0.60 07/25/2020   FREET4 1.09 09/19/2021   FREET4 1.36 05/14/2021   FREET4 1.21 02/08/2021   FREET4 1.4 09/28/2020     Pt denies feeling nodules in neck, hoarseness, dysphagia/odynophagia, SOB with lying down.  she has No history of radiation therapy to head or neck.  No recent use of iodine supplements.  I reviewed her chart and she also has a history of GERD with ulcers, depression/anxiety, anemia, asthma, and high blood pressure.   Review of systems  Constitutional: + Minimally fluctuating body weight,  current Body mass index is 23.45 kg/m. , no fatigue, no subjective hyperthermia, no subjective hypothermia, + insomnia Eyes: no blurry vision, no xerophthalmia ENT: no sore throat, no nodules palpated in throat, no dysphagia/odynophagia, no hoarseness Cardiovascular: no chest pain, no shortness of breath, no palpitations, no leg swelling Respiratory: no cough, no shortness of breath Gastrointestinal: no nausea/vomiting/diarrhea, Musculoskeletal: no muscle/joint aches, recent balance issues since her hip surgery Skin: no rashes, no hyperemia Neurological: no tremors, no numbness, no tingling, + dizziness Psychiatric: no depression, + anxiety-  controlled   OBJECTIVE:  BP 126/80  Pulse 74   Ht 5\' 4"  (1.626 m)   Wt 136 lb 9.6 oz (62 kg)   BMI 23.45 kg/m  Wt Readings from Last 3 Encounters:  09/26/21 136 lb 9.6 oz (62 kg)  07/31/21 132 lb 6.4 oz (60.1 kg)  07/03/21 134 lb 8 oz (61 kg)   BP Readings from Last 3 Encounters:  09/26/21 126/80  07/31/21 (!) 147/93  07/03/21 138/84      Physical Exam- Limited  Constitutional:  Body mass index is 23.45 kg/m. , not in acute distress, normal state of mind Eyes:  EOMI, no exophthalmos Neck: Supple Cardiovascular: RRR, no murmurs, rubs, or gallops, no edema Respiratory: Adequate breathing efforts, no crackles, rales, rhonchi, or wheezing Musculoskeletal: no gross deformities, strength intact in all four extremities, no gross restriction of joint movements Skin:  no rashes, no hyperemia Neurological: no tremor with outstretched hands   CMP ( most recent) CMP     Component Value Date/Time   NA 139 03/15/2021 1220   NA 141 09/24/2018 1204   K 3.3 (L) 03/15/2021 1220   CL 102 03/15/2021 1220   CO2 25 03/15/2021 1220   GLUCOSE 88 03/15/2021 1220   BUN 10 03/15/2021 1220   BUN 13 09/24/2018 1204   CREATININE 0.66 03/15/2021 1220   CREATININE 0.94 (H) 01/22/2021 1419   CALCIUM 8.8 (L) 03/15/2021 1220   PROT 6.6 03/15/2021 1220   PROT 6.5 09/24/2018 1204   ALBUMIN 3.8 03/15/2021 1220   ALBUMIN 4.3 09/24/2018 1204   AST 24 03/15/2021 1220   ALT 15 03/15/2021 1220   ALKPHOS 69 03/15/2021 1220   BILITOT 0.4 03/15/2021 1220   BILITOT 0.4 09/24/2018 1204   GFRNONAA >60 03/15/2021 1220   GFRAA >60 05/08/2020 1232     Diabetic Labs (most recent): No results found for: HGBA1C   Lipid Panel ( most recent) Lipid Panel     Component Value Date/Time   TRIG 92 09/11/2020 1547       Lab Results  Component Value Date   TSH 2.670 09/19/2021   TSH 2.040 05/14/2021   TSH 6.350 (H) 02/08/2021   TSH 2.39 09/28/2020   TSH 0.60 07/25/2020   FREET4 1.09 09/19/2021    FREET4 1.36 05/14/2021   FREET4 1.21 02/08/2021   FREET4 1.4 09/28/2020       ASSESSMENT / PLAN: 1. Hypothyroidism- unspecified etiology   Patient with long-standing hypothyroidism, on levothyroxine therapy. On physical exam, patient  does not have gross goiter, thyroid nodules, or neck compression symptoms.  Thyroid US WNL for age.  -Her previsit thyroid function tests are consistent with appropriate hormone replacement.  She is advised to continue Levothyroxine 50 mcg po daily before breakfast.   - We discussed about correct intake of levothyroxine, at fasting, with water, separated by at least 30 minutes from breakfast. -Patient is made aware of the fact that thyroid hormone replacement is needed for life, dose to be adjusted by periodic monitoring of thyroid function tests.    I spent 25 minutes in the care of the patient today including review of labs from Thyroid Function, CMP, and other relevant labs ; imaging/biopsy records (current and previous including abstractions from other facilities); face-to-face time discussing  her lab results and symptoms, medications doses, her options of short and long term treatment based on the latest standards of care / guidelines;   and documenting the encounter.  [name]  participated in the discussions, expressed understanding, and voiced agreement with the above  plans.  All questions were answered to her satisfaction. she is encouraged to contact clinic should she have any questions or concerns prior to her return visit.   Follow Up Plan: Return in about 6 months (around 03/27/2022) for Thyroid follow up, Previsit labs.    Rayetta Pigg, [facility] [address] [place], [place] [address] Phone: [phone] Fax: [phone]  09/26/2021, 11:28 AM

## 2021-09-26 NOTE — Patient Instructions (Signed)

## 2021-09-28 DIAGNOSIS — Z96641 Presence of right artificial hip joint: Secondary | ICD-10-CM | POA: Diagnosis not present

## 2021-09-28 DIAGNOSIS — M545 Low back pain, unspecified: Secondary | ICD-10-CM | POA: Diagnosis not present

## 2021-09-28 DIAGNOSIS — M25651 Stiffness of right hip, not elsewhere classified: Secondary | ICD-10-CM | POA: Diagnosis not present

## 2021-09-28 DIAGNOSIS — R531 Weakness: Secondary | ICD-10-CM | POA: Diagnosis not present

## 2021-10-05 DIAGNOSIS — R531 Weakness: Secondary | ICD-10-CM | POA: Diagnosis not present

## 2021-10-05 DIAGNOSIS — M545 Low back pain, unspecified: Secondary | ICD-10-CM | POA: Diagnosis not present

## 2021-10-05 DIAGNOSIS — M25651 Stiffness of right hip, not elsewhere classified: Secondary | ICD-10-CM | POA: Diagnosis not present

## 2021-10-05 DIAGNOSIS — Z96641 Presence of right artificial hip joint: Secondary | ICD-10-CM | POA: Diagnosis not present

## 2021-10-08 ENCOUNTER — Other Ambulatory Visit: Payer: Self-pay

## 2021-10-08 NOTE — Patient Instructions (Signed)
Goals Addressed             This Visit's Progress    COMPLETED: Prevent Falls and Injury   On track    Barriers: Knowledge Timeframe:  Short-Term Goal Priority:  High Start Date:     04/26/21                        Expected End Date: 08/01/21               Follow Up Date 06/30/21   - learn how to get back up if I fall - make an emergency alert plan in case I fall - use a nonslip pad with throw rugs, or remove them completely - use a cane or walker    Why is this important?   Most falls happen when it is hard for you to walk safely. Your balance may be off because of an illness. You may have pain in your knees, hip or other joints.  You may be overly tired or taking medicines that make you sleepy. You may not be able to see or hear clearly.  Falls can lead to broken bones, bruises or other injuries.  There are things you can do to help prevent falling.     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  Patient limited weight bearing to see surgeon 05/07/21.  05/31/21 No more falls since last fall on 04-18-21. Falls Prevention Discussed Keeping moving and stay active Keep your bones strong Go for regular eye checkups Always stand up slowly Wear proper non-slip footwear Light up your living space Install assistive devices.      Track and Manage My Blood Pressure-Hypertension   On track    Barriers: Knowledge Timeframe:  Long-Range Goal Priority:  High Start Date:  11/21/20                           Expected End Date:   05/31/22  Follow Up Date 01/29/22   - check blood pressure weekly - write blood pressure results in a log or diary    Why is this important?   You won't feel high blood pressure, but it can still hurt your blood vessels.  High blood pressure can cause heart or kidney problems. It can also cause a stroke.  Making lifestyle changes like losing a little weight or eating less salt will help.  Checking your blood pressure at home and at different times  of the day can help to control blood pressure.  If the doctor prescribes medicine remember to take it the way the doctor ordered.  Call the office if you cannot afford the medicine or if there are questions about it.     Notes: 02/21/21 Patient blood pressure less than 140/80. 03/21/21 Most recent blood pressure 144/68 04/26/21 Continue to monitor blood pressure. 05/31/21  Hypertension Management Discussed Take medications as ordered Limit salt intake. Eat plenty of fruits and vegetables Remain as active as possible Take blood pressure at least weekly at home and record in a notebook to take to doctor's visits.  10/08/21 Patient has not been checking her blood pressure.  Encouraged to check weekly.  Hypertension management reiterated.

## 2021-10-08 NOTE — Patient Outreach (Signed)
[facility]) Care Management  [place]  10/08/2021   OLAR SANTINI 1941-03-12 [id number]  Subjective: Telephone call to patient for follow up disease management. Patient reports she is doing ok no change.  Hypertension management discussed.   Objective:   Encounter Medications:  Outpatient Encounter Medications as of 10/08/2021  Medication Sig   acetaminophen (TYLENOL) 500 MG tablet Take 500-1,000 mg by mouth as needed for mild pain or moderate pain.   albuterol (VENTOLIN HFA) 108 (90 Base) MCG/ACT inhaler Inhale 2 puffs into the lungs every 6 (six) hours as needed for wheezing or shortness of breath.   ALPRAZolam (XANAX) 0.5 MG tablet Take 0.5 mg by mouth in the morning, at noon, and at bedtime. (1000, 1500, AT BEDTIME)   amLODipine (NORVASC) 2.5 MG tablet Take 2.5 mg by mouth every evening.    aspirin EC 81 MG tablet Take 81 mg by mouth daily. Swallow whole.   chlorhexidine (PERIDEX) 0.12 % solution Use as directed 15 mLs in the mouth or throat as needed (mouth ulcers).   Cholecalciferol (VITAMIN D3) 50 MCG (2000 UT) capsule Take 2,000 Units by mouth daily.   diclofenac Sodium (VOLTAREN) 1 % GEL Apply 1 application topically as needed (back pain).   fluticasone (FLONASE) 50 MCG/ACT nasal spray Place 2 sprays into both nostrils daily as needed for allergies.   levothyroxine (SYNTHROID) 50 MCG tablet Take 1 tablet (50 mcg total) by mouth daily before breakfast.   losartan (COZAAR) 25 MG tablet Take 25 mg by mouth daily.   Multiple Vitamin (MULTIVITAMIN WITH MINERALS) TABS tablet Take 1 tablet by mouth daily in the afternoon.   mupirocin ointment (BACTROBAN) 2 % Apply topically as needed.   NONFORMULARY OR COMPOUNDED ITEM Apply 1 application topically as needed (foot neuropathy). neuropathy cream dic 3%/bac 2%/gab %/lid 5%/meth 1%   pantoprazole (PROTONIX) 40 MG tablet Take 1 tablet (40 mg total) by mouth daily.   polyethylene glycol (MIRALAX / GLYCOLAX)  17 g packet Take 17 g by mouth daily.   PREVIDENT 5000 BOOSTER PLUS 1.1 % PSTE Place 1 application onto teeth every other day.   raloxifene (EVISTA) 60 MG tablet Take 1 tablet (60 mg total) by mouth every morning. (Patient taking differently: Take 60 mg by mouth every other day. IN THE MORNING)   SYMBICORT 80-4.5 MCG/ACT inhaler INHALE 2 PUFFS INTO THE LUNGS TWICE DAILY. (Patient taking differently: Inhale 2 puffs into the lungs daily.)   Turmeric Curcumin 500 MG CAPS Take 500 mg by mouth daily.   venlafaxine XR (EFFEXOR-XR) 75 MG 24 hr capsule venlafaxine ER 75 mg capsule,extended release 24 hr   No facility-administered encounter medications on file as of 10/08/2021.    Functional Status:  In your present state of health, do you have any difficulty performing the following activities: 05/31/2021 02/05/2021  Hearing? Tempie Donning  Comment wears hearing aids wears hearing aids  Vision? N N  Difficulty concentrating or making decisions? N N  Walking or climbing stairs? Y Y  Comment right hip replacement right hip replacement  Dressing or bathing? N N  Doing errands, shopping? Tempie Donning  Comment family helps family helps  Preparing Food and eating ? Tempie Donning  Comment family helps family helps  Using the Toilet? N N  In the past six months, have you accidently leaked urine? N N  Do you have problems with loss of bowel control? N N  Managing your Medications? N N  Managing your Finances? N N  Housekeeping or managing your Housekeeping? Tempie Donning  Comment family helps family helps  Some recent data might be hidden    Fall/Depression Screening: Fall Risk  07/03/2021 05/31/2021 04/26/2021  Falls in the past year? 1 1 1   Comment - - -  Number falls in past yr: 0 1 1  Comment - - -  Injury with Fall? 1 1 1   Comment - - -  Risk for fall due to : - Impaired balance/gait;History of fall(s) Impaired balance/gait;History of fall(s)  Follow up - Falls prevention discussed Falls prevention discussed;Education provided    [facility] 2/9 Scores 05/31/2021 02/05/2021 11/21/2020 05/03/2020 04/10/2020 08/13/2019  PHQ - 2 Score 0 0 0 0 0 0  PHQ- 9 Score - - - - 3 -    Assessment:   Care Plan Care Plan : General Plan of Care (Adult)  Updates made by Jon Billings, RN since 10/08/2021 12:00 AM     Problem: Health Promotion or Disease Self-Management (General Plan of Care) recent hip replacement Resolved 10/08/2021  Priority: High  Onset Date: 02/05/2021       Goals Addressed             This Visit's Progress    COMPLETED: Prevent Falls and Injury   On track    Barriers: Knowledge Timeframe:  Short-Term Goal Priority:  High Start Date:     04/26/21                        Expected End Date: 08/01/21               Follow Up Date 06/30/21   - learn how to get back up if I fall - make an emergency alert plan in case I fall - use a nonslip pad with throw rugs, or remove them completely - use a cane or walker    Why is this important?   Most falls happen when it is hard for you to walk safely. Your balance may be off because of an illness. You may have pain in your knees, hip or other joints.  You may be overly tired or taking medicines that make you sleepy. You may not be able to see or hear clearly.  Falls can lead to broken bones, bruises or other injuries.  There are things you can do to help prevent falling.     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  Patient limited weight bearing to see surgeon 05/07/21.  05/31/21 No more falls since last fall on 04-18-21. Falls Prevention Discussed Keeping moving and stay active Keep your bones strong Go for regular eye checkups Always stand up slowly Wear proper non-slip footwear Light up your living space Install assistive devices.      Track and Manage My Blood Pressure-Hypertension   On track    Barriers: Knowledge Timeframe:  Long-Range Goal Priority:  High Start Date:  11/21/20                           Expected End Date:   05/31/22  Follow  Up Date 01/29/22   - check blood pressure weekly - write blood pressure results in a log or diary    Why is this important?   You won't feel high blood pressure, but it can still hurt your blood vessels.  High blood pressure can cause heart or kidney problems. It can also cause a stroke.  Making lifestyle  changes like losing a little weight or eating less salt will help.  Checking your blood pressure at home and at different times of the day can help to control blood pressure.  If the doctor prescribes medicine remember to take it the way the doctor ordered.  Call the office if you cannot afford the medicine or if there are questions about it.     Notes: 02/21/21 Patient blood pressure less than 140/80. 03/21/21 Most recent blood pressure 144/68 04/26/21 Continue to monitor blood pressure. 05/31/21  Hypertension Management Discussed Take medications as ordered Limit salt intake. Eat plenty of fruits and vegetables Remain as active as possible Take blood pressure at least weekly at home and record in a notebook to take to doctor's visits.  10/08/21 Patient has not been checking her blood pressure.  Encouraged to check weekly.  Hypertension management reiterated.          Plan:  Follow-up: Patient agrees to Care Plan and Follow-up. Follow-up in 3 month(s)  Jone Baseman, RN, MSN [place] Management Care Management Coordinator Direct Line [phone] Cell [phone] Toll Free: [phone]  Fax: [phone]

## 2021-10-10 ENCOUNTER — Ambulatory Visit: Payer: Self-pay

## 2021-10-11 ENCOUNTER — Telehealth: Payer: Self-pay | Admitting: *Deleted

## 2021-10-11 DIAGNOSIS — M25651 Stiffness of right hip, not elsewhere classified: Secondary | ICD-10-CM | POA: Diagnosis not present

## 2021-10-11 DIAGNOSIS — Z96641 Presence of right artificial hip joint: Secondary | ICD-10-CM | POA: Diagnosis not present

## 2021-10-11 DIAGNOSIS — R531 Weakness: Secondary | ICD-10-CM | POA: Diagnosis not present

## 2021-10-11 DIAGNOSIS — M545 Low back pain, unspecified: Secondary | ICD-10-CM | POA: Diagnosis not present

## 2021-10-11 NOTE — Telephone Encounter (Signed)
Patient called in. She is wanting to transfer her care to [place] with susan and she is too come by the office and sign a release to have records sent to them.she will come by today.

## 2021-10-15 DIAGNOSIS — R2689 Other abnormalities of gait and mobility: Secondary | ICD-10-CM | POA: Diagnosis not present

## 2021-10-15 DIAGNOSIS — K5900 Constipation, unspecified: Secondary | ICD-10-CM | POA: Diagnosis not present

## 2021-10-15 DIAGNOSIS — R1031 Right lower quadrant pain: Secondary | ICD-10-CM | POA: Diagnosis not present

## 2021-11-05 DIAGNOSIS — K5900 Constipation, unspecified: Secondary | ICD-10-CM | POA: Diagnosis not present

## 2021-11-05 DIAGNOSIS — R0600 Dyspnea, unspecified: Secondary | ICD-10-CM | POA: Diagnosis not present

## 2021-11-05 DIAGNOSIS — R1031 Right lower quadrant pain: Secondary | ICD-10-CM | POA: Diagnosis not present

## 2021-11-05 DIAGNOSIS — F411 Generalized anxiety disorder: Secondary | ICD-10-CM | POA: Diagnosis not present

## 2021-11-05 DIAGNOSIS — R194 Change in bowel habit: Secondary | ICD-10-CM | POA: Diagnosis not present

## 2021-11-16 ENCOUNTER — Other Ambulatory Visit: Payer: Self-pay | Admitting: Internal Medicine

## 2021-11-19 DIAGNOSIS — Z658 Other specified problems related to psychosocial circumstances: Secondary | ICD-10-CM | POA: Diagnosis not present

## 2021-11-19 DIAGNOSIS — F330 Major depressive disorder, recurrent, mild: Secondary | ICD-10-CM | POA: Diagnosis not present

## 2021-11-19 NOTE — Telephone Encounter (Signed)
Patient called in November stating she was transferring her care to [facility].  As far as I know, this is primary care and not a GI specialty clinic.  Does she plan to continue to follow with our office for her GI care?  If so, I can refill her pantoprazole.  If not, she will need to obtain her pantoprazole from provider at [facility].

## 2021-11-20 NOTE — Telephone Encounter (Signed)
Spoke to pt, she informed me that she went to [facility] for a consultation (second opinion).  Pt stated it was to far, so she decided not to go back. She would like to continue care here. She does need a refill on Pantoprazole as she is almost out.

## 2021-11-26 ENCOUNTER — Other Ambulatory Visit: Payer: Self-pay | Admitting: Podiatry

## 2021-11-28 DIAGNOSIS — Z1283 Encounter for screening for malignant neoplasm of skin: Secondary | ICD-10-CM | POA: Diagnosis not present

## 2021-11-28 DIAGNOSIS — X32XXXD Exposure to sunlight, subsequent encounter: Secondary | ICD-10-CM | POA: Diagnosis not present

## 2021-11-28 DIAGNOSIS — L821 Other seborrheic keratosis: Secondary | ICD-10-CM | POA: Diagnosis not present

## 2021-11-28 DIAGNOSIS — L570 Actinic keratosis: Secondary | ICD-10-CM | POA: Diagnosis not present

## 2021-11-28 DIAGNOSIS — D225 Melanocytic nevi of trunk: Secondary | ICD-10-CM | POA: Diagnosis not present

## 2021-12-11 DIAGNOSIS — H04123 Dry eye syndrome of bilateral lacrimal glands: Secondary | ICD-10-CM | POA: Diagnosis not present

## 2021-12-24 DIAGNOSIS — R5383 Other fatigue: Secondary | ICD-10-CM | POA: Diagnosis not present

## 2021-12-24 DIAGNOSIS — F411 Generalized anxiety disorder: Secondary | ICD-10-CM | POA: Diagnosis not present

## 2021-12-24 DIAGNOSIS — Z79899 Other long term (current) drug therapy: Secondary | ICD-10-CM | POA: Diagnosis not present

## 2022-01-04 DIAGNOSIS — J029 Acute pharyngitis, unspecified: Secondary | ICD-10-CM | POA: Diagnosis not present

## 2022-01-04 DIAGNOSIS — R059 Cough, unspecified: Secondary | ICD-10-CM | POA: Diagnosis not present

## 2022-01-04 DIAGNOSIS — R0982 Postnasal drip: Secondary | ICD-10-CM | POA: Diagnosis not present

## 2022-01-04 DIAGNOSIS — J3489 Other specified disorders of nose and nasal sinuses: Secondary | ICD-10-CM | POA: Insufficient documentation

## 2022-01-07 ENCOUNTER — Other Ambulatory Visit: Payer: Self-pay

## 2022-01-07 NOTE — Patient Outreach (Signed)
[facility]) Care Management  01/07/2022  [name] May 10, 1941 [id number]  CMA made telephone outreach call as patient has upcoming appointment with Care Coordinator Jon Billings, RN. Patient aware RN appointment is cancelled and will be rescheduled once she returns.  [facility] Management Assistant [phone]

## 2022-01-09 ENCOUNTER — Ambulatory Visit: Payer: Self-pay

## 2022-01-14 DIAGNOSIS — F330 Major depressive disorder, recurrent, mild: Secondary | ICD-10-CM | POA: Diagnosis not present

## 2022-01-17 DIAGNOSIS — M545 Low back pain, unspecified: Secondary | ICD-10-CM | POA: Diagnosis not present

## 2022-01-20 ENCOUNTER — Encounter: Payer: Self-pay | Admitting: Emergency Medicine

## 2022-01-20 ENCOUNTER — Other Ambulatory Visit: Payer: Self-pay

## 2022-01-20 ENCOUNTER — Ambulatory Visit
Admission: EM | Admit: 2022-01-20 | Discharge: 2022-01-20 | Disposition: A | Discharge status: Home / Self Care | Payer: Medicare HMO | Attending: Urgent Care | Admitting: Urgent Care

## 2022-01-20 DIAGNOSIS — J019 Acute sinusitis, unspecified: Secondary | ICD-10-CM | POA: Diagnosis not present

## 2022-01-20 MED ORDER — CETIRIZINE HCL 5 MG PO TABS: 5.0000 mg | ORAL_TABLET | Freq: Every day | ORAL | 0 refills | Status: DC

## 2022-01-20 MED ORDER — DOXYCYCLINE HYCLATE 100 MG PO CAPS: 100.0000 mg | ORAL_CAPSULE | Freq: Two times a day (BID) | ORAL | 0 refills | Status: DC

## 2022-01-20 NOTE — ED Triage Notes (Signed)
Cough and nasal congestion with scratchy throat x 1 week.  Yellow and greenish thick nasal congestion.  Facial pain.

## 2022-01-20 NOTE — ED Provider Notes (Signed)
[place]   MRN: [id number] DOB: 11/20/1941  Subjective:   [name] is a 81 y.o. female presenting for 2-week history of persistent and worsening sinus congestion, sinus pressure now having sinus pain, facial pain.  She continues to have a productive cough.  Has a history of sinus infections, asthma.  Does not feel any chest pain, shortness of breath or wheezing.  She did go to her doctor and was advised to use supportive care which she has done.  No current facility-administered medications for this encounter.  Current Outpatient Medications:    acetaminophen (TYLENOL) 500 MG tablet, Take 500-1,000 mg by mouth as needed for mild pain or moderate pain., Disp: , Rfl:    albuterol (VENTOLIN HFA) 108 (90 Base) MCG/ACT inhaler, Inhale 2 puffs into the lungs every 6 (six) hours as needed for wheezing or shortness of breath., Disp: , Rfl:    ALPRAZolam (XANAX) 0.5 MG tablet, Take 0.5 mg by mouth in the morning, at noon, and at bedtime. (1000, 1500, AT BEDTIME), Disp: , Rfl:    amLODipine (NORVASC) 2.5 MG tablet, Take 2.5 mg by mouth every evening. , Disp: , Rfl:    aspirin EC 81 MG tablet, Take 81 mg by mouth daily. Swallow whole., Disp: , Rfl:    chlorhexidine (PERIDEX) 0.12 % solution, Use as directed 15 mLs in the mouth or throat as needed (mouth ulcers)., Disp: , Rfl:    Cholecalciferol (VITAMIN D3) 50 MCG (2000 UT) capsule, Take 2,000 Units by mouth daily., Disp: , Rfl:    diclofenac Sodium (VOLTAREN) 1 % GEL, Apply 1 application topically as needed (back pain)., Disp: , Rfl:    fluticasone (FLONASE) 50 MCG/ACT nasal spray, Place 2 sprays into both nostrils daily as needed for allergies., Disp: , Rfl:    levothyroxine (SYNTHROID) 50 MCG tablet, Take 1 tablet (50 mcg total) by mouth daily before breakfast., Disp: 90 tablet, Rfl: 3   losartan (COZAAR) 25 MG tablet, Take 25 mg by mouth daily., Disp: , Rfl:    Multiple Vitamin (MULTIVITAMIN WITH MINERALS) TABS tablet,  Take 1 tablet by mouth daily in the afternoon., Disp: , Rfl:    mupirocin ointment (BACTROBAN) 2 %, APPLY TO AFFECTED AREA DAILY, Disp: 22 g, Rfl: 0   NONFORMULARY OR COMPOUNDED ITEM, Apply 1 application topically as needed (foot neuropathy). neuropathy cream dic 3%/bac 2%/gab %/lid 5%/meth 1%, Disp: , Rfl:    pantoprazole (PROTONIX) 40 MG tablet, TAKE 1 TABLET BY MOUTH ONCE A DAY., Disp: 30 tablet, Rfl: 5   polyethylene glycol (MIRALAX / GLYCOLAX) 17 g packet, Take 17 g by mouth daily., Disp: , Rfl:    PREVIDENT 5000 BOOSTER PLUS 1.1 % PSTE, Place 1 application onto teeth every other day., Disp: , Rfl:    raloxifene (EVISTA) 60 MG tablet, Take 1 tablet (60 mg total) by mouth every morning. (Patient taking differently: Take 60 mg by mouth every other day. IN THE MORNING), Disp: 30 tablet, Rfl: 12   SYMBICORT 80-4.5 MCG/ACT inhaler, INHALE 2 PUFFS INTO THE LUNGS TWICE DAILY. (Patient taking differently: Inhale 2 puffs into the lungs daily.), Disp: 10.2 g, Rfl: 0   Turmeric Curcumin 500 MG CAPS, Take 500 mg by mouth daily., Disp: , Rfl:    venlafaxine XR (EFFEXOR-XR) 75 MG 24 hr capsule, venlafaxine ER 75 mg capsule,extended release 24 hr, Disp: , Rfl:    Allergies  Allergen Reactions   Amoxicillin-Pot Clavulanate Nausea And Vomiting, Other (See Comments), Itching and Nausea Only  Has patient had a PCN reaction causing immediate rash, facial/tongue/throat swelling, SOB or lightheadedness with hypotension: No Has patient had a PCN reaction causing severe rash involving mucus membranes or skin necrosis: No Has patient had a PCN reaction that required hospitalization: No Has patient had a PCN reaction occurring within the last 10 years: No If all of the above answers are "NO", then may proceed with Cephalosporin use. Has patient had a PCN reaction causing immediate rash, facial/tongue/throat swelling, SOB or lightheadedness with hypotension: No Has patient had a PCN reaction causing severe rash  involving mucus membranes or skin necrosis: No Has patient had a PCN reaction that required hospitalization: No Has patient had a PCN reaction occurring within the last 10 years: No If all of the above answers are "NO", then may proceed with Cephalosporin use.    Ace Inhibitors Cough   Entex La Itching   Levofloxacin Hives and Itching   Meloxicam Other (See Comments)    (MOBIC) Unknown reaction type (maybe nausea?)    Past Medical History:  Diagnosis Date   Anemia    Anxiety    Arthritis    Asthma    Constipation    GERD (gastroesophageal reflux disease)    High blood pressure    History of COVID-19 12/28/2020   Hypothyroidism    PAD (peripheral artery disease) (HCC)    bilateral legs   Peripheral edema    Peripheral neuropathy    bilateral feet at night occ   Psoriasis    Seasonal allergies    Skin cancer    Left eye brow   Varicose veins    Vertigo 11/23/2013     Past Surgical History:  Procedure Laterality Date   APPENDECTOMY     patient unsure   BIOPSY  12/21/2020   Procedure: BIOPSY;  Surgeon: Daneil Dolin, MD;  [facility]: [facility];  Service: Endoscopy;;   BIOPSY  05/28/2021   Procedure: BIOPSY;  Surgeon: Eloise Harman, DO;  Location: [facility];  Service: Endoscopy;;   CATARACT EXTRACTION W/PHACO Left 01/27/2017   Procedure: CATARACT EXTRACTION PHACO AND INTRAOCULAR LENS PLACEMENT ([place]);  Surgeon: Tonny Branch, MD;  Location: [facility];  Service: Ophthalmology;  Laterality: Left;  CDE: 29.90   CATARACT EXTRACTION W/PHACO Right 02/24/2017   Procedure: CATARACT EXTRACTION PHACO AND INTRAOCULAR LENS PLACEMENT ([facility]);  Surgeon: Tonny Branch, MD;  Location: [facility];  Service: Ophthalmology;  Laterality: Right;  CDE: 8.57   CHOLECYSTECTOMY     COLONOSCOPY N/A 10/01/2016   Procedure: COLONOSCOPY;  Surgeon: Daneil Dolin, MD; pancolonic diverticulosis, melanosis coli, otherwise normal exam.  No recommendations to repeat colonoscopy.   COLONOSCOPY WITH PROPOFOL  N/A 12/21/2020   Rourk: Diverticulosis   ESOPHAGOGASTRODUODENOSCOPY (EGD) WITH PROPOFOL N/A 12/21/2020   Rourk: Erosive reflux esophagitis, nonbleeding gastric ulcers with biopsy showing erosion/ulceration, negative for H. pylori.   ESOPHAGOGASTRODUODENOSCOPY (EGD) WITH PROPOFOL N/A 05/28/2021   Carver: gastritis (reactive gastropathy), no H.pylori   FLEXOR TENDON REPAIR Right 11/06/2017   Procedure: RIGHT WRIST FLEXOR TENDON REPAIRAND STT DEBRIEDMENT;  Surgeon: Leanora Cover, MD;  Location: [facility];  Service: Orthopedics;  Laterality: Right;   MOUTH SURGERY     artery bleed   TOTAL HIP ARTHROPLASTY Right 01/31/2021   Procedure: TOTAL HIP ARTHROPLASTY ANTERIOR APPROACH;  Surgeon: Rod Can, MD;  Location: [facility];  Service: Orthopedics;  Laterality: Right;   TUBAL LIGATION      Family History  Problem Relation Age of Onset   Hypertension  Mother    Varicose Veins Father    Heart attack Father    Heart disease Father    Hypertension Brother    COPD Brother     Social History   Tobacco Use   Smoking status: Former    Packs/day: 0.25    Years: 5.00    Pack years: 1.25    Types: Cigarettes    Quit date: 12/02/1997    Years since quitting: 24.1   Smokeless tobacco: Never  Vaping Use   Vaping Use: Never used  Substance Use Topics   Alcohol use: Not Currently    Alcohol/week: 1.0 standard drink    Types: 1 Glasses of wine per week   Drug use: No    ROS   Objective:   Vitals: BP (!) 146/82 (BP Location: Right Arm)    Pulse 88    Temp 97.7 F (36.5 C) (Oral)    Resp 18    SpO2 98%   Physical Exam Constitutional:      General: She is not in acute distress.    Appearance: Normal appearance. She is well-developed and normal weight. She is not ill-appearing, toxic-appearing or diaphoretic.  HENT:     Head: Normocephalic and atraumatic.     Right Ear: Ear canal and external ear normal. No drainage or tenderness. No middle ear effusion. There is no  impacted cerumen. Tympanic membrane is not erythematous.     Left Ear: Ear canal and external ear normal. No drainage or tenderness.  No middle ear effusion. There is no impacted cerumen. Tympanic membrane is not erythematous.     Ears:     Comments: Mild air-fluid level bilaterally over the TMs.    Nose: Congestion present. No rhinorrhea.     Mouth/Throat:     Mouth: Mucous membranes are moist. No oral lesions.     Pharynx: No pharyngeal swelling, oropharyngeal exudate, posterior oropharyngeal erythema or uvula swelling.     Tonsils: No tonsillar exudate or tonsillar abscesses.     Comments: Thick postnasal drainage overlying pharynx. Eyes:     General: No scleral icterus.       Right eye: No discharge.        Left eye: No discharge.     Extraocular Movements: Extraocular movements intact.     Right eye: Normal extraocular motion.     Left eye: Normal extraocular motion.     Conjunctiva/sclera: Conjunctivae normal.  Cardiovascular:     Rate and Rhythm: Normal rate.     Heart sounds: No murmur heard.   No friction rub. No gallop.  Pulmonary:     Effort: Pulmonary effort is normal. No respiratory distress.     Breath sounds: No stridor. No wheezing, rhonchi or rales.  Chest:     Chest wall: No tenderness.  Musculoskeletal:     Cervical back: Normal range of motion and neck supple.  Lymphadenopathy:     Cervical: No cervical adenopathy.  Skin:    General: Skin is warm and dry.  Neurological:     General: No focal deficit present.     Mental Status: She is alert and oriented to person, place, and time.  Psychiatric:        Mood and Affect: Mood normal.        Behavior: Behavior normal.     Assessment and Plan :   PDMP not reviewed this encounter.  1. Acute non-recurrent sinusitis, unspecified location    Deferred imaging given clear cardiopulmonary exam, hemodynamically stable  vital signs.  Given timeline of illness, deferred testing. Will start empiric treatment for  sinusitis with doxycycline given her adverse reactions and possible allergy to amoxicillin.  Recommended supportive care otherwise including the use of oral antihistamine.  Follow-up with PCP.  Counseled patient on potential for adverse effects with medications prescribed/recommended today, ER and return-to-clinic precautions discussed, patient verbalized understanding.    Jaynee Eagles, PA-C 01/20/22 1027

## 2022-01-20 NOTE — ED Triage Notes (Signed)
Has been taking corocedin and tylelnol to help with symptoms

## 2022-02-04 ENCOUNTER — Encounter: Payer: Self-pay | Admitting: Internal Medicine

## 2022-02-11 DIAGNOSIS — F330 Major depressive disorder, recurrent, mild: Secondary | ICD-10-CM | POA: Diagnosis not present

## 2022-02-18 ENCOUNTER — Other Ambulatory Visit: Payer: Self-pay | Admitting: Podiatry

## 2022-02-19 ENCOUNTER — Other Ambulatory Visit: Payer: Self-pay

## 2022-02-19 NOTE — Patient Outreach (Signed)
[name]) Care Management ? ?02/19/2022 ? ?[name] ?1941/01/10 ?[id number] ? ? ?Telephone call to patient for follow up.  Patient reports she is having problems with her blood pressure being up. She has an appointment in the morning for follow up with PCP.  Losartan was increased last week.  However, blood pressure continues to fluctuate.  Blood pressure today was 189/110.  Patient reports some confusion and increased urination.  She is taking AZO presently.  Encouraged patient to go to follow up appointment and discuss with physician.  She verbalized understanding.   ? ?Care Plan : Hypertension (Adult)  ?Updates made by Jon Billings, RN since 02/19/2022 12:00 AM  ?Completed 02/19/2022  ? ?Problem: Hypertension (Hypertension) Resolved 02/19/2022  ?Priority: High  ?  ? ?Long-Range Goal: Hypertension Monitored as evidenced by blood pressure less than 140/80. Completed 02/19/2022  ?Start Date: 11/21/2020  ?Expected End Date: 12/01/2021  ?Recent Progress: On track  ?Priority: High  ?Note:   ?Evidence-based guidance:  ?Review blood pressure measurements taken inside and outside of the provider  ?office; establish baseline and monitor trends; compare to target ranges or patient  ?goal   ?Notes 8/50/27 duplicate goal ?  ? ?Task: Identify and Monitor Blood Pressure Elevation Completed 02/19/2022  ?Due Date: 12/01/2021  ?Priority: Routine  ?Responsible User: Jon Billings, RN  ?Note:   ?Care Management Activities:  ?  ?- blood pressure trends reviewed ?- home or ambulatory blood pressure monitoring encouraged  ?  ?Notes: Recent blood pressure 144/68. ?04/26/21 Patient reports blood pressure has been good.  Discussed activity.   ?05/31/21 ?Patient reports blood pressure less than 140/80.   ?Hypertension Management Discussed ?Take medications as ordered ?Limit salt intake. ?Eat plenty of fruits and vegetables ?Remain as active as possible ?Take blood pressure at least weekly at home and record in a  notebook to take to doctors visits.  ?  ? ?Care Plan : General Plan of Care (Adult)  ?Updates made by Jon Billings, RN since 02/19/2022 12:00 AM  ?Completed 02/19/2022  ? ?Care Plan : RN CM Plan of Care  ?Updates made by Jon Billings, RN since 02/19/2022 12:00 AM  ?  ? ?Problem: Chronic disease management and care coordination of HTN.   ?Priority: High  ?  ? ?Long-Range Goal: Development of plan of care for management of HTN   ?Start Date: 02/19/2022  ?Expected End Date: 12/01/2022  ?This Visit's Progress: On track  ?Priority: High  ?Note:   ?Current Barriers:  ?Chronic Disease Management support and education needs related to HTN  ? ?RNCM Clinical Goal(s):  ?Patient will verbalize basic understanding of  HTN disease process and self health management plan as evidenced by Blood pressure less than 140/80 ?continue to work with RN Care Manager to address care management and care coordination needs related to  HTN as evidenced by adherence to CM Team Scheduled appointments through collaboration with RN Care manager, provider, and care team.  ? ?Interventions: ?Education and support related to HTN ?Inter-disciplinary care team collaboration (see longitudinal plan of care) ?Evaluation of current treatment plan related to  self management and patient's adherence to plan as established by provider ? ? ?Hypertension Interventions:  (Status:  Goal on track:  Yes.) Long Term Goal ?Last practice recorded BP readings:  ?BP Readings from Last 3 Encounters:  ?01/20/22 (!) 146/82  ?09/26/21 126/80  ?07/31/21 (!) 147/93  ?Most recent eGFR/CrCl: No results found for: EGFR  No components found for: CRCL ? ?Evaluation of current treatment  plan related to hypertension self management and patient's adherence to plan as established by provider ?Reviewed medications with patient and discussed importance of compliance ?Discussed plans with patient for ongoing care management follow up and provided patient with direct contact information for  care management team ? ?Patient Goals/Self-Care Activities: Hypertension ?Take all medications as prescribed ?Attend all scheduled provider appointments ?check blood pressure daily ?keep a blood pressure log ?take blood pressure log to all doctor appointments ?call doctor for signs and symptoms of high blood pressure ?Limit salt intake ? ?Follow Up Plan:  Telephone follow up appointment with care management team member scheduled for:  April ?We have been unable to make contact with the patient for follow up. The care management team is available to follow up with the patient after provider conversation with the patient regarding recommendation for care management engagement and subsequent re-referral to the care management team. ? ?  ? ?Plan: Follow-up: Patient agrees to Care Plan and Follow-up. ?Follow-up in April. ? ?Jone Baseman, RN, MSN ?[facility] Care Management ?Care Management Coordinator ?Direct Line [phone] ?Toll Free: [phone]  ?Fax: [phone] ? ?

## 2022-02-19 NOTE — Patient Instructions (Signed)
Patient Goals/Self-Care Activities: Hypertension Take all medications as prescribed Attend all scheduled provider appointments check blood pressure daily keep a blood pressure log take blood pressure log to all doctor appointments call doctor for signs and symptoms of high blood pressure Limit salt intake 

## 2022-02-20 ENCOUNTER — Ambulatory Visit: Payer: Self-pay

## 2022-02-20 DIAGNOSIS — R410 Disorientation, unspecified: Secondary | ICD-10-CM | POA: Insufficient documentation

## 2022-02-20 DIAGNOSIS — R32 Unspecified urinary incontinence: Secondary | ICD-10-CM | POA: Insufficient documentation

## 2022-02-20 DIAGNOSIS — I10 Essential (primary) hypertension: Secondary | ICD-10-CM | POA: Diagnosis not present

## 2022-02-20 DIAGNOSIS — R350 Frequency of micturition: Secondary | ICD-10-CM | POA: Insufficient documentation

## 2022-02-21 DIAGNOSIS — M1711 Unilateral primary osteoarthritis, right knee: Secondary | ICD-10-CM | POA: Insufficient documentation

## 2022-02-26 DIAGNOSIS — M1711 Unilateral primary osteoarthritis, right knee: Secondary | ICD-10-CM | POA: Diagnosis not present

## 2022-02-26 DIAGNOSIS — Z96641 Presence of right artificial hip joint: Secondary | ICD-10-CM | POA: Diagnosis not present

## 2022-02-26 DIAGNOSIS — M25552 Pain in left hip: Secondary | ICD-10-CM | POA: Diagnosis not present

## 2022-02-27 DIAGNOSIS — I10 Essential (primary) hypertension: Secondary | ICD-10-CM | POA: Diagnosis not present

## 2022-02-27 DIAGNOSIS — M25561 Pain in right knee: Secondary | ICD-10-CM | POA: Diagnosis not present

## 2022-03-01 DIAGNOSIS — M25552 Pain in left hip: Secondary | ICD-10-CM | POA: Diagnosis not present

## 2022-03-01 DIAGNOSIS — M1612 Unilateral primary osteoarthritis, left hip: Secondary | ICD-10-CM | POA: Diagnosis not present

## 2022-03-01 DIAGNOSIS — I10 Essential (primary) hypertension: Secondary | ICD-10-CM | POA: Diagnosis not present

## 2022-03-01 DIAGNOSIS — E785 Hyperlipidemia, unspecified: Secondary | ICD-10-CM | POA: Diagnosis not present

## 2022-03-07 ENCOUNTER — Other Ambulatory Visit: Payer: Self-pay | Admitting: Podiatry

## 2022-03-07 NOTE — Telephone Encounter (Signed)
Please advise 

## 2022-03-11 DIAGNOSIS — F330 Major depressive disorder, recurrent, mild: Secondary | ICD-10-CM | POA: Diagnosis not present

## 2022-03-18 DIAGNOSIS — E039 Hypothyroidism, unspecified: Secondary | ICD-10-CM | POA: Diagnosis not present

## 2022-03-18 DIAGNOSIS — I10 Essential (primary) hypertension: Secondary | ICD-10-CM | POA: Diagnosis not present

## 2022-03-18 DIAGNOSIS — Z136 Encounter for screening for cardiovascular disorders: Secondary | ICD-10-CM | POA: Diagnosis not present

## 2022-03-19 LAB — T4, FREE: Free T4: 1.38 ng/dL (ref 0.82–1.77)

## 2022-03-19 LAB — TSH: TSH: 1.74 u[IU]/mL (ref 0.450–4.500)

## 2022-03-20 ENCOUNTER — Other Ambulatory Visit: Payer: Self-pay

## 2022-03-20 NOTE — Patient Outreach (Signed)
[facility]) Care Management ? ?03/20/2022 ? ?[name] ?17-Apr-1941 ?[id number] ? ? ?Telephone call to patient for disease management follow up. Patient doing good today.  Blood pressure management better.  PCP office visit on 03/21/22.  Hypertension management reviewed.   ? ?Care Plan : RN CM Plan of Care  ?Updates made by Jon Billings, RN since 03/20/2022 12:00 AM  ?  ? ?Problem: Chronic disease management and care coordination of HTN.   ?Priority: High  ?  ? ?Long-Range Goal: Development of plan of care for management of HTN   ?Start Date: 02/19/2022  ?Expected End Date: 12/01/2022  ?This Visit's Progress: On track  ?Recent Progress: On track  ?Priority: High  ?Note:   ?Current Barriers:  ?Chronic Disease Management support and education needs related to HTN  ? ?RNCM Clinical Goal(s):  ?Patient will verbalize basic understanding of  HTN disease process and self health management plan as evidenced by Blood pressure less than 140/80 ?continue to work with RN Care Manager to address care management and care coordination needs related to  HTN as evidenced by adherence to CM Team Scheduled appointments through collaboration with RN Care manager, provider, and care team.  ? ?Interventions: ?Education and support related to HTN ?Inter-disciplinary care team collaboration (see longitudinal plan of care) ?Evaluation of current treatment plan related to  self management and patient's adherence to plan as established by provider ? ? ?Hypertension Interventions:  (Status:  Goal on track:  Yes.) Long Term Goal ?Last practice recorded BP readings:  ?BP Readings from Last 3 Encounters:  ?01/20/22 (!) 146/82  ?09/26/21 126/80  ?07/31/21 (!) 147/93  ?Most recent eGFR/CrCl: No results found for: EGFR  No components found for: CRCL ? ?Evaluation of current treatment plan related to hypertension self management and patient's adherence to plan as established by provider ?Reviewed medications with patient and  discussed importance of compliance ?Discussed plans with patient for ongoing care management follow up and provided patient with direct contact information for care management team ? ?03/20/22 Patient reports blood pressure better but still fluctuates.  She sees the physician on tomorrow for follow up.  Last report BP 139/86. Discussed hypertension management and importance to reduce risk for heart attack and stroke.   ? ? ?Patient Goals/Self-Care Activities: Hypertension ?Take all medications as prescribed ?Attend all scheduled provider appointments ?check blood pressure daily ?keep a blood pressure log ?take blood pressure log to all doctor appointments ?call doctor for signs and symptoms of high blood pressure ?Limit salt intake ? ?Follow Up Plan:  Telephone follow up appointment with care management team member scheduled for:  May ?The patient has been provided with contact information for the care management team and has been advised to call with any health related questions or concerns.  ? ?  ? ?Plan: Follow-up: Patient agrees to Care Plan and Follow-up. ?Follow-up in May.  ? ?Jone Baseman, RN, MSN ?[facility] Care Management ?Care Management Coordinator ?Direct Line [phone] ?Toll Free: [phone]  ?Fax: [phone] ? ?

## 2022-03-20 NOTE — Patient Instructions (Signed)
Patient Goals/Self-Care Activities: Hypertension Take all medications as prescribed Attend all scheduled provider appointments check blood pressure daily keep a blood pressure log take blood pressure log to all doctor appointments call doctor for signs and symptoms of high blood pressure Limit salt intake 

## 2022-03-21 DIAGNOSIS — F418 Other specified anxiety disorders: Secondary | ICD-10-CM | POA: Diagnosis not present

## 2022-03-21 DIAGNOSIS — E876 Hypokalemia: Secondary | ICD-10-CM | POA: Diagnosis not present

## 2022-03-21 DIAGNOSIS — E782 Mixed hyperlipidemia: Secondary | ICD-10-CM | POA: Diagnosis not present

## 2022-03-21 DIAGNOSIS — E039 Hypothyroidism, unspecified: Secondary | ICD-10-CM | POA: Diagnosis not present

## 2022-03-21 DIAGNOSIS — R600 Localized edema: Secondary | ICD-10-CM | POA: Diagnosis not present

## 2022-03-21 DIAGNOSIS — Z0001 Encounter for general adult medical examination with abnormal findings: Secondary | ICD-10-CM | POA: Diagnosis not present

## 2022-03-21 DIAGNOSIS — E119 Type 2 diabetes mellitus without complications: Secondary | ICD-10-CM | POA: Diagnosis not present

## 2022-03-21 DIAGNOSIS — I10 Essential (primary) hypertension: Secondary | ICD-10-CM | POA: Diagnosis not present

## 2022-03-21 DIAGNOSIS — K219 Gastro-esophageal reflux disease without esophagitis: Secondary | ICD-10-CM | POA: Diagnosis not present

## 2022-03-26 ENCOUNTER — Encounter: Payer: Self-pay | Admitting: Nurse Practitioner

## 2022-03-26 ENCOUNTER — Ambulatory Visit: Payer: Medicare HMO | Admitting: Nurse Practitioner

## 2022-03-26 VITALS — BP 147/84 | HR 72 | Ht 64.0 in | Wt 145.4 lb

## 2022-03-26 DIAGNOSIS — E039 Hypothyroidism, unspecified: Secondary | ICD-10-CM | POA: Diagnosis not present

## 2022-03-26 MED ORDER — LEVOTHYROXINE SODIUM 50 MCG PO TABS: 50.0000 ug | ORAL_TABLET | Freq: Every day | ORAL | 3 refills | Status: DC

## 2022-03-26 NOTE — Patient Instructions (Signed)

## 2022-03-26 NOTE — Progress Notes (Signed)
?                                   ?                         Endocrinology Follow Up Visit ?                                       03/26/2022, 11:35 AM ? ? ? ? ?SUBJECTIVE: ? ?[name] is a 81 y.o.-year-old female patient being seen in follow up after being seen in consultation for hypothyroidism referred by [name], MD. ? ? ?Past Medical History:  ?Diagnosis Date  ? Anemia   ? Anxiety   ? Arthritis   ? Asthma   ? Constipation   ? GERD (gastroesophageal reflux disease)   ? High blood pressure   ? History of COVID-19 12/28/2020  ? Hypothyroidism   ? PAD (peripheral artery disease) ([place])   ? bilateral legs  ? Peripheral edema   ? Peripheral neuropathy   ? bilateral feet at night occ  ? Psoriasis   ? Seasonal allergies   ? Skin cancer   ? Left eye brow  ? Varicose veins   ? Vertigo 11/23/2013  ? ? ?Past Surgical History:  ?Procedure Laterality Date  ? APPENDECTOMY    ? patient unsure  ? BIOPSY  12/21/2020  ? Procedure: BIOPSY;  Surgeon: Daneil Dolin, MD;  [facility];  Service: Endoscopy;;  ? BIOPSY  05/28/2021  ? Procedure: BIOPSY;  Surgeon: Eloise Harman, DO;  [facility];  Service: Endoscopy;;  ? CATARACT EXTRACTION W/PHACO Left 01/27/2017  ? Procedure: CATARACT EXTRACTION PHACO AND INTRAOCULAR LENS PLACEMENT (IOC);  Surgeon: Tonny Branch, MD;  [facility]: [facility];  Service: Ophthalmology;  Laterality: Left;  CDE: 29.90  ? CATARACT EXTRACTION W/PHACO Right 02/24/2017  ? Procedure: CATARACT EXTRACTION PHACO AND INTRAOCULAR LENS PLACEMENT (IOC);  Surgeon: Tonny Branch, MD;  [facility]: [facility];  Service: Ophthalmology;  Laterality: Right;  CDE: 8.57  ? CHOLECYSTECTOMY    ? COLONOSCOPY N/A 10/01/2016  ? Procedure: COLONOSCOPY;  Surgeon: Daneil Dolin, MD; pancolonic diverticulosis, melanosis coli, otherwise normal exam.  No recommendations to repeat colonoscopy.  ? COLONOSCOPY WITH PROPOFOL N/A 12/21/2020  ? Rourk: Diverticulosis  ? ESOPHAGOGASTRODUODENOSCOPY (EGD) WITH PROPOFOL N/A  12/21/2020  ? Rourk: Erosive reflux esophagitis, nonbleeding gastric ulcers with biopsy showing erosion/ulceration, negative for H. pylori.  ? ESOPHAGOGASTRODUODENOSCOPY (EGD) WITH PROPOFOL N/A 05/28/2021  ? Carver: gastritis (reactive gastropathy), no H.pylori  ? FLEXOR TENDON REPAIR Right 11/06/2017  ? Procedure: RIGHT WRIST FLEXOR TENDON REPAIRAND STT [place];  Surgeon: Leanora Cover, MD;  Location: [place];  Service: Orthopedics;  Laterality: Right;  ? MOUTH SURGERY    ? artery bleed  ? TOTAL HIP ARTHROPLASTY Right 01/31/2021  ? Procedure: TOTAL HIP ARTHROPLASTY ANTERIOR APPROACH;  Surgeon: Rod Can, MD;  [facility];  Service: Orthopedics;  Laterality: Right;  ? TUBAL LIGATION    ? ? ?Social History  ? ?Socioeconomic History  ? Marital status: Married  ?  Spouse name: Not on file  ? Number of children: Not on file  ? Years of education: Not on file  ? Highest education level: Not on file  ?Occupational History  ?  Not on file  ?Tobacco Use  ? Smoking status: Former  ?  Packs/day: 0.25  ?  Years: 5.00  ?  Pack years: 1.25  ?  Types: Cigarettes  ?  Quit date: 12/02/1997  ?  Years since quitting: 24.3  ? Smokeless tobacco: Never  ?Vaping Use  ? Vaping Use: Never used  ?Substance and Sexual Activity  ? Alcohol use: Not Currently  ?  Alcohol/week: 1.0 standard drink  ?  Types: 1 Glasses of wine per week  ? Drug use: No  ? Sexual activity: Not Currently  ?  Birth control/protection: Surgical  ?  Comment: tubal  ?Other Topics Concern  ? Not on file  ?Social History Narrative  ? Not on file  ? ?Social Determinants of Health  ? ?Financial Resource Strain: Not on file  ?Food Insecurity: No Food Insecurity  ? Worried About Charity fundraiser in the Last Year: Never true  ? Ran Out of Food in the Last Year: Never true  ?Transportation Needs: No Transportation Needs  ? Lack of Transportation (Medical): No  ? Lack of Transportation (Non-Medical): No  ?Physical Activity: Not on file  ?Stress:  Not on file  ?Social Connections: Not on file  ? ? ?Family History  ?Problem Relation Age of Onset  ? Hypertension Mother   ? Varicose Veins Father   ? Heart attack Father   ? Heart disease Father   ? Hypertension Brother   ? COPD Brother   ? ? ?Outpatient Encounter Medications as of 03/26/2022  ?Medication Sig  ? acetaminophen (TYLENOL) 500 MG tablet Take 500-1,000 mg by mouth as needed for mild pain or moderate pain.  ? albuterol (VENTOLIN HFA) 108 (90 Base) MCG/ACT inhaler Inhale 2 puffs into the lungs every 6 (French) hours as needed for wheezing or shortness of breath.  ? ALPRAZolam (XANAX) 0.5 MG tablet Take 0.5 mg by mouth in the morning, at noon, and at bedtime. (1000, 1500, AT BEDTIME)  ? amLODipine (NORVASC) 2.5 MG tablet Take 5 mg by mouth every evening.  ? aspirin EC 81 MG tablet Take 81 mg by mouth daily. Swallow whole.  ? azelastine (ASTELIN) 0.1 % nasal spray Place into both nostrils.  ? cetirizine (ZYRTEC) 5 MG tablet Take 1 tablet (5 mg total) by mouth daily.  ? chlorhexidine (PERIDEX) 0.12 % solution Use as directed 15 mLs in the mouth or throat as needed (mouth ulcers).  ? Cholecalciferol (VITAMIN D3) 50 MCG (2000 UT) capsule Take 2,000 Units by mouth daily.  ? diclofenac Sodium (VOLTAREN) 1 % GEL Apply 1 application topically as needed (back pain).  ? fluocinonide gel (LIDEX) 0.05 % Apply topically.  ? fluticasone (FLONASE) 50 MCG/ACT nasal spray Place 2 sprays into both nostrils daily as needed for allergies.  ? losartan (COZAAR) 25 MG tablet Take 100 mg by mouth daily.  ? losartan (COZAAR) 50 MG tablet Take 50 mg by mouth daily.  ? Multiple Vitamin (MULTIVITAMIN WITH MINERALS) TABS tablet Take 1 tablet by mouth daily in the afternoon.  ? mupirocin ointment (BACTROBAN) 2 % APPLY TO AFFECTED AREA DAILY  ? NONFORMULARY OR COMPOUNDED ITEM Apply 1 application topically as needed (foot neuropathy). neuropathy cream dic 3%/bac 2%/gab %/lid 5%/meth 1%  ? pantoprazole (PROTONIX) 40 MG tablet TAKE 1 TABLET  BY MOUTH ONCE A DAY.  ? polyethylene glycol (MIRALAX / GLYCOLAX) 17 g packet Take 17 g by mouth daily.  ? PREVIDENT 5000 BOOSTER PLUS 1.1 % PSTE Place 1 application onto  teeth every other day.  ? raloxifene (EVISTA) 60 MG tablet Take 1 tablet (60 mg total) by mouth every morning. (Patient taking differently: Take 60 mg by mouth every other day. IN THE MORNING)  ? sodium fluoride (FLUORISHIELD) 1.1 % GEL dental gel PreviDent 5000 Booster Plus 1.1 % dental paste ? Uses every other night at bedtime (prescribed by dentist)  ? SYMBICORT 80-4.5 MCG/ACT inhaler INHALE 2 PUFFS INTO THE LUNGS TWICE DAILY. (Patient taking differently: Inhale 2 puffs into the lungs daily.)  ? Turmeric Curcumin 500 MG CAPS Take 500 mg by mouth daily.  ? venlafaxine XR (EFFEXOR-XR) 75 MG 24 hr capsule venlafaxine ER 75 mg capsule,extended release 24 hr  ? [DISCONTINUED] levothyroxine (SYNTHROID) 50 MCG tablet Take 1 tablet (50 mcg total) by mouth daily before breakfast.  ? levothyroxine (SYNTHROID) 50 MCG tablet Take 1 tablet (50 mcg total) by mouth daily before breakfast.  ? [DISCONTINUED] doxycycline (VIBRAMYCIN) 100 MG capsule Take 1 capsule (100 mg total) by mouth 2 (two) times daily. (Patient not taking: Reported on 03/26/2022)  ? ?No facility-administered encounter medications on file as of 03/26/2022.  ? ? ?ALLERGIES: ?Allergies  ?Allergen Reactions  ? Amoxicillin-Pot Clavulanate Nausea And Vomiting, Other (See Comments), Itching and Nausea Only  ?  Has patient had a PCN reaction causing immediate rash, facial/tongue/throat swelling, SOB or lightheadedness with hypotension: No ?Has patient had a PCN reaction causing severe rash involving mucus membranes or skin necrosis: No ?Has patient had a PCN reaction that required hospitalization: No ?Has patient had a PCN reaction occurring within the last 10 years: No ?If all of the above answers are "NO", then may proceed with Cephalosporin use. ?Has patient had a PCN reaction causing immediate  rash, facial/tongue/throat swelling, SOB or lightheadedness with hypotension: No ?Has patient had a PCN reaction causing severe rash involving mucus membranes or skin necrosis: No ?Has patient had a PCN rea

## 2022-03-27 ENCOUNTER — Ambulatory Visit: Payer: Medicare HMO | Admitting: Nurse Practitioner

## 2022-04-04 ENCOUNTER — Encounter (HOSPITAL_COMMUNITY): Payer: Self-pay | Admitting: Physical Therapy

## 2022-04-04 ENCOUNTER — Ambulatory Visit (HOSPITAL_COMMUNITY): Payer: Medicare HMO | Attending: Orthopaedic Surgery | Admitting: Physical Therapy

## 2022-04-04 DIAGNOSIS — R2689 Other abnormalities of gait and mobility: Secondary | ICD-10-CM | POA: Diagnosis not present

## 2022-04-04 DIAGNOSIS — M25561 Pain in right knee: Secondary | ICD-10-CM | POA: Insufficient documentation

## 2022-04-04 NOTE — Therapy (Signed)
?OUTPATIENT PHYSICAL THERAPY LOWER EXTREMITY EVALUATION ? ? ?Patient Name: [name] ?MRN: [id number] ?DOB:15-Sep-1941, 81 y.o., female ?Today's Date: 04/04/2022 ? ? PT End of Session - 04/04/22 1112   ? ? Visit Number 1   ? Number of Visits 12   ? Date for PT Re-Evaluation 05/16/22   ? Authorization Type [employer]   ? Authorization Time Period check auth   ? Progress Note Due on Visit 10   ? PT Start Time 1032   ? PT Stop Time 1113   ? PT Time Calculation (min) 41 min   ? Activity Tolerance Patient tolerated treatment well   ? Behavior During Therapy [facility] for tasks assessed/performed   ? ?  ?  ? ?  ? ? ?Past Medical History:  ?Diagnosis Date  ? Anemia   ? Anxiety   ? Arthritis   ? Asthma   ? Constipation   ? GERD (gastroesophageal reflux disease)   ? High blood pressure   ? History of COVID-19 12/28/2020  ? Hypothyroidism   ? PAD (peripheral artery disease) ([place])   ? bilateral legs  ? Peripheral edema   ? Peripheral neuropathy   ? bilateral feet at night occ  ? Psoriasis   ? Seasonal allergies   ? Skin cancer   ? Left eye brow  ? Varicose veins   ? Vertigo 11/23/2013  ? ?Past Surgical History:  ?Procedure Laterality Date  ? APPENDECTOMY    ? patient unsure  ? BIOPSY  12/21/2020  ? Procedure: BIOPSY;  Surgeon: Daneil Dolin, MD;  [facility];  Service: Endoscopy;;  ? BIOPSY  05/28/2021  ? Procedure: BIOPSY;  Surgeon: Eloise Harman, DO;  [facility];  Service: Endoscopy;;  ? CATARACT EXTRACTION W/PHACO Left 01/27/2017  ? Procedure: CATARACT EXTRACTION PHACO AND INTRAOCULAR LENS PLACEMENT (IOC);  Surgeon: Tonny Branch, MD;  [facility];  Service: Ophthalmology;  Laterality: Left;  CDE: 29.90  ? CATARACT EXTRACTION W/PHACO Right 02/24/2017  ? Procedure: CATARACT EXTRACTION PHACO AND INTRAOCULAR LENS PLACEMENT (IOC);  Surgeon: Tonny Branch, MD;  [facility];  Service: Ophthalmology;  Laterality: Right;  CDE: 8.57  ? CHOLECYSTECTOMY    ? COLONOSCOPY N/A 10/01/2016  ?  Procedure: COLONOSCOPY;  Surgeon: Daneil Dolin, MD; pancolonic diverticulosis, melanosis coli, otherwise normal exam.  No recommendations to repeat colonoscopy.  ? COLONOSCOPY WITH PROPOFOL N/A 12/21/2020  ? Rourk: Diverticulosis  ? ESOPHAGOGASTRODUODENOSCOPY (EGD) WITH PROPOFOL N/A 12/21/2020  ? Rourk: Erosive reflux esophagitis, nonbleeding gastric ulcers with biopsy showing erosion/ulceration, negative for H. pylori.  ? ESOPHAGOGASTRODUODENOSCOPY (EGD) WITH PROPOFOL N/A 05/28/2021  ? Carver: gastritis (reactive gastropathy), no H.pylori  ? FLEXOR TENDON REPAIR Right 11/06/2017  ? Procedure: RIGHT WRIST FLEXOR TENDON REPAIRAND STT [place];  Surgeon: Leanora Cover, MD;  Location: [place];  Service: Orthopedics;  Laterality: Right;  ? MOUTH SURGERY    ? artery bleed  ? TOTAL HIP ARTHROPLASTY Right 01/31/2021  ? Procedure: TOTAL HIP ARTHROPLASTY ANTERIOR APPROACH;  Surgeon: Rod Can, MD;  [facility];  Service: Orthopedics;  Laterality: Right;  ? TUBAL LIGATION    ? ?Patient Active Problem List  ? Diagnosis Date Noted  ? Gastritis and gastroduodenitis 06/14/2021  ? Gastric ulcer 04/13/2021  ? Abdominal pain 03/15/2021  ? Fracture of femoral neck, right ([place]) 01/31/2021  ? Closed displaced fracture of right femoral neck ([place]) 01/31/2021  ? Indigestion 11/02/2020  ? Change in stool caliber 11/02/2020  ? Loss  of weight 08/23/2020  ? Nausea without vomiting 06/15/2020  ? Tick bite 04/10/2020  ? Medication refill 04/10/2020  ? Acquired trigger finger 03/28/2020  ? Pain in right knee 10/08/2019  ? Acute maxillary sinusitis 08/20/2019  ? Cellulitis, leg 08/10/2019  ? Cellulitis 08/08/2019  ? Fever 08/08/2019  ? Cellulitis of foot 08/08/2019  ? Hypokalemia 08/08/2019  ? Hyponatremia 08/08/2019  ? Essential hypertension 08/08/2019  ? 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  ? Adverse food reaction 09/24/2018  ? Mild persistent asthma without complication 25/36/6440  ? Pain of left hip joint  07/17/2018  ? Dyspnea on exertion 01/24/2017  ? Cough variant asthma  vs UACS 01/24/2017  ? High risk medication use 09/25/2016  ? Varicose veins of lower extremities with other complications 34/74/2595  ? Varicose veins 02/15/2014  ? Vertigo 11/23/2013  ? Pain in limb 10/04/2013  ? Hip bursitis 02/11/2013  ? Constipation 02/16/2009  ? RECTAL BLEEDING 02/16/2009  ? HIGH BLOOD PRESSURE 01/29/2008  ? ? ?PCP: Allyn Kenner MD  ? ?REFERRING PROVIDER: Rod Can, MD  ? 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 ?THERAPY DIAG:  ?Right knee pain, unspecified chronicity ? ?Other abnormalities of gait and mobility ? ?ONSET DATE: 3/ 2023  ? ?SUBJECTIVE:  ? ?SUBJECTIVE STATEMENT: ?Patient presents to therapy with complaint of  RT knee pain. She had recent injection which was somewhat helpful. She also notes start of LT hip pain this morning. She thinks she may have moved funny.  ? ?PERTINENT HISTORY: ?RT THA  ? ?PAIN:  ?Are you having pain? Yes: NPRS scale: 4/10 ?Pain location: RT knee  ?Pain description: dull, ache ?Aggravating factors: Stairs, squatting, bending  ?Relieving factors: OTC meds, rest, sitting ? ?PRECAUTIONS: None ? ?WEIGHT BEARING RESTRICTIONS No ? ?FALLS:  ?Has patient fallen in last 6 months? No ? ?LIVING ENVIRONMENT: ?Lives with: lives with their spouse ?Lives in: House/apartment ?Stairs: Yes: External: 2 steps; none ?Has following equipment at home: Single point cane and Walker - 2 wheeled ? ?OCCUPATION: Retired  ? ?PLOF: Independent ? ?PATIENT GOALS For knee to quit hurting, work in garden/ flowers  ? ? ?OBJECTIVE:  ? ?DIAGNOSTIC FINDINGS: NA ? ?PATIENT SURVEYS:  ?FOTO 50% function  ? ?COGNITION: ? Overall cognitive status: Within functional limits for tasks assessed   ?  ? ?PALPATION: ?Mod TTP about medial and lateral RT knee joint lines ? ?LE ROM: ? ?Active ROM Right ?04/04/2022 Left ?04/04/2022  ?Hip flexion    ?Hip extension    ?Hip abduction    ?Hip adduction    ?Hip internal rotation     ?Hip external rotation    ?Knee flexion 125 125  ?Knee extension 0 0  ?Ankle dorsiflexion    ?Ankle plantarflexion    ?Ankle inversion    ?Ankle eversion    ? (Blank rows = not tested) ? ?LE MMT: ? ?MMT Right ?04/04/2022 Left ?04/04/2022  ?Hip flexion 4 4  ?Hip extension 3- 3-  ?Hip abduction 4 4  ?Hip adduction    ?Hip internal rotation    ?Hip external rotation    ?Knee flexion    ?Knee extension 4 4  ?Ankle dorsiflexion 4 4  ?Ankle plantarflexion    ?Ankle inversion    ?Ankle eversion    ? (Blank rows = not tested) ? ? ?FUNCTIONAL TESTS:  ?5 times sit to stand: 14.3 sec with no hands  ? ?GAIT: ?Decreased stride, antalgic, decreased hip and knee flexion bilat, no AD  ? ? ?TODAY'S TREATMENT: ?04/04/22 ?  Quad set ?SLR ?Heel slide  ? ? ?PATIENT EDUCATION:  ?Education details: on evaluation findings, POC and HEP  ?Person educated: Patient ?Education method: Explanation ?Education comprehension: verbalized understanding and returned demonstration ? ? ?HOME EXERCISE PROGRAM: ?04/04/22 ?Quad set ? SLR ?Heel slide  ? ?ASSESSMENT: ? ?CLINICAL IMPRESSION: ?Patient is a 81 y.o. female who presents to physical therapy with complaint of RT knee pain. Patient demonstrates muscle weakness, reduced ROM, and fascial restrictions which are likely contributing to symptoms of pain and are negatively impacting patient ability to perform ADLs and functional mobility tasks. Patient will benefit from skilled physical therapy services to address these deficits to reduce pain and improve level of function with ADLs and functional mobility tasks. ? ? ?OBJECTIVE IMPAIRMENTS Abnormal gait, decreased activity tolerance, decreased balance, decreased mobility, difficulty walking, decreased ROM, decreased strength, hypomobility, increased fascial restrictions, impaired flexibility, improper body mechanics, and pain.  ? ?ACTIVITY LIMITATIONS cleaning, community activity, driving, meal prep, laundry, yard work, shopping, and yard work.  ? ?PERSONAL  FACTORS Age are also affecting patient's functional outcome.  ? ? ?REHAB POTENTIAL: Good ? ?CLINICAL DECISION MAKING: Stable/uncomplicated ? ?EVALUATION COMPLEXITY: Low ? ? ?GOALS: ?SHORT TERM GOALS: Target date: 5/2

## 2022-04-11 ENCOUNTER — Ambulatory Visit (HOSPITAL_COMMUNITY): Payer: Medicare HMO | Admitting: Physical Therapy

## 2022-04-11 ENCOUNTER — Encounter (HOSPITAL_COMMUNITY): Payer: Self-pay | Admitting: Physical Therapy

## 2022-04-11 DIAGNOSIS — M25561 Pain in right knee: Secondary | ICD-10-CM

## 2022-04-11 DIAGNOSIS — R2689 Other abnormalities of gait and mobility: Secondary | ICD-10-CM | POA: Diagnosis not present

## 2022-04-11 NOTE — Therapy (Signed)
?OUTPATIENT PHYSICAL THERAPY TREATMENT NOTE ? ? ?Patient Name: [name] ?MRN: [id number] ?DOB:21-May-1941, 81 y.o., female ?Today's Date: 04/11/2022 ? ?PCP: Allyn Kenner MD  ?REFERRING PROVIDER: Rod Can, MD  ? ?END OF SESSION:  ? PT End of Session - 04/11/22 0948   ? ? Visit Number 2   ? Number of Visits 12   ? Date for PT Re-Evaluation 05/16/22   ? Authorization Type [employer]   ? Authorization Time Period Approved 12 5/4-6/15/23   ? Progress Note Due on Visit 10   ? PT Start Time [phone]   ? PT Stop Time 1028   ? PT Time Calculation (min) 38 min   ? Activity Tolerance Patient tolerated treatment well   ? Behavior During Therapy [facility] for tasks assessed/performed   ? ?  ?  ? ?  ? ? ?Past Medical History:  ?Diagnosis Date  ? Anemia   ? Anxiety   ? Arthritis   ? Asthma   ? Constipation   ? GERD (gastroesophageal reflux disease)   ? High blood pressure   ? History of COVID-19 12/28/2020  ? Hypothyroidism   ? PAD (peripheral artery disease) ([place])   ? bilateral legs  ? Peripheral edema   ? Peripheral neuropathy   ? bilateral feet at night occ  ? Psoriasis   ? Seasonal allergies   ? Skin cancer   ? Left eye brow  ? Varicose veins   ? Vertigo 11/23/2013  ? ?Past Surgical History:  ?Procedure Laterality Date  ? APPENDECTOMY    ? patient unsure  ? BIOPSY  12/21/2020  ? Procedure: BIOPSY;  Surgeon: Daneil Dolin, MD;  [facility];  Service: Endoscopy;;  ? BIOPSY  05/28/2021  ? Procedure: BIOPSY;  Surgeon: Eloise Harman, DO;  Location: [facility];  Service: Endoscopy;;  ? CATARACT EXTRACTION W/PHACO Left 01/27/2017  ? Procedure: CATARACT EXTRACTION PHACO AND INTRAOCULAR LENS PLACEMENT (IOC);  Surgeon: Tonny Branch, MD;  Location: [facility];  Service: Ophthalmology;  Laterality: Left;  CDE: 29.90  ? CATARACT EXTRACTION W/PHACO Right 02/24/2017  ? Procedure: CATARACT EXTRACTION PHACO AND INTRAOCULAR LENS PLACEMENT (IOC);  Surgeon: Tonny Branch, MD;  Location: [facility];  Service: Ophthalmology;   Laterality: Right;  CDE: 8.57  ? CHOLECYSTECTOMY    ? COLONOSCOPY N/A 10/01/2016  ? Procedure: COLONOSCOPY;  Surgeon: Daneil Dolin, MD; pancolonic diverticulosis, melanosis coli, otherwise normal exam.  No recommendations to repeat colonoscopy.  ? COLONOSCOPY WITH PROPOFOL N/A 12/21/2020  ? Rourk: Diverticulosis  ? ESOPHAGOGASTRODUODENOSCOPY (EGD) WITH PROPOFOL N/A 12/21/2020  ? Rourk: Erosive reflux esophagitis, nonbleeding gastric ulcers with biopsy showing erosion/ulceration, negative for H. pylori.  ? ESOPHAGOGASTRODUODENOSCOPY (EGD) WITH PROPOFOL N/A 05/28/2021  ? Carver: gastritis (reactive gastropathy), no H.pylori  ? FLEXOR TENDON REPAIR Right 11/06/2017  ? Procedure: RIGHT WRIST FLEXOR TENDON REPAIRAND [facility] [place];  Surgeon: Leanora Cover, MD;  Location: [place] [facility];  Service: Orthopedics;  Laterality: Right;  ? MOUTH SURGERY    ? artery bleed  ? TOTAL HIP ARTHROPLASTY Right 01/31/2021  ? Procedure: TOTAL HIP ARTHROPLASTY ANTERIOR APPROACH;  Surgeon: Rod Can, MD;  Location: [facility];  Service: Orthopedics;  Laterality: Right;  ? TUBAL LIGATION    ? ?Patient Active Problem List  ? Diagnosis Date Noted  ? Gastritis and gastroduodenitis 06/14/2021  ? Gastric ulcer 04/13/2021  ? Abdominal pain 03/15/2021  ? Fracture of femoral neck, right ([place]) 01/31/2021  ? Closed displaced fracture of right femoral neck (  [place]) 01/31/2021  ? Indigestion 11/02/2020  ? Change in stool caliber 11/02/2020  ? Loss of weight 08/23/2020  ? Nausea without vomiting 06/15/2020  ? Tick bite 04/10/2020  ? Medication refill 04/10/2020  ? Acquired trigger finger 03/28/2020  ? Pain in right knee 10/08/2019  ? Acute maxillary sinusitis 08/20/2019  ? Cellulitis, leg 08/10/2019  ? Cellulitis 08/08/2019  ? Fever 08/08/2019  ? Cellulitis of foot 08/08/2019  ? Hypokalemia 08/08/2019  ? Hyponatremia 08/08/2019  ? Essential hypertension 08/08/2019  ? 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  ? Adverse food reaction 09/24/2018  ?  Mild persistent asthma without complication 21/30/8657  ? Pain of left hip joint 07/17/2018  ? Dyspnea on exertion 01/24/2017  ? Cough variant asthma  vs UACS 01/24/2017  ? High risk medication use 09/25/2016  ? Varicose veins of lower extremities with other complications 84/69/6295  ? Varicose veins 02/15/2014  ? Vertigo 11/23/2013  ? Pain in limb 10/04/2013  ? Hip bursitis 02/11/2013  ? Constipation 02/16/2009  ? RECTAL BLEEDING 02/16/2009  ? HIGH BLOOD PRESSURE 01/29/2008  ? ? 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 ?THERAPY DIAG:  ?Right knee pain, unspecified chronicity ? ?Other abnormalities of gait and mobility ? ?PERTINENT HISTORY: Rt THA  ? ?PRECAUTIONS: None  ? ?SUBJECTIVE: No pain today. Compliant with HEP. Feels they are helping.  ? ?PAIN:  ?Are you having pain? No ? ?  ?OBJECTIVE:  ?  ?DIAGNOSTIC FINDINGS: NA ?  ?PATIENT SURVEYS:  ?FOTO 50% function  ?  ?COGNITION: ?          Overall cognitive status: Within functional limits for tasks assessed               ?           ?  ?PALPATION: ?Mod TTP about medial and lateral RT knee joint lines ?  ?LE ROM: ?  ?Active ROM Right ?04/04/2022 Left ?04/04/2022  ?Hip flexion      ?Hip extension      ?Hip abduction      ?Hip adduction      ?Hip internal rotation      ?Hip external rotation      ?Knee flexion 125 125  ?Knee extension 0 0  ?Ankle dorsiflexion      ?Ankle plantarflexion      ?Ankle inversion      ?Ankle eversion      ? (Blank rows = not tested) ?  ?LE MMT: ?  ?MMT Right ?04/04/2022 Left ?04/04/2022  ?Hip flexion 4 4  ?Hip extension 3- 3-  ?Hip abduction 4 4  ?Hip adduction      ?Hip internal rotation      ?Hip external rotation      ?Knee flexion      ?Knee extension 4 4  ?Ankle dorsiflexion 4 4  ?Ankle plantarflexion      ?Ankle inversion      ?Ankle eversion      ? (Blank rows = not tested) ?  ?  ?FUNCTIONAL TESTS:  ?5 times sit to stand: 14.3 sec with no hands  ?  ?GAIT: ?Decreased stride, antalgic, decreased hip and knee flexion bilat,  no AD  ?  ?  ?TODAY'S TREATMENT: ?04/11/22 ?Quad set 15 x 5"  ?SLR x 15 ?Heel slide x 15  ?Glute set 15 x 5"   ?Bridge x15 ?Clamshell x 20 each  ?Hamstring stretch (active)  x15  ? ? ?04/04/22 ?Quad set ?SLR ?Heel slide  ?  ?  ?  PATIENT EDUCATION:  ?Education details: on evaluation findings, POC and HEP 5/11 on goals, exercise form and function  ?Person educated: Patient ?Education method: Explanation ?Education comprehension: verbalized understanding and returned demonstration ?  ?  ?HOME EXERCISE PROGRAM: ?04/04/22 ?Quad set ? SLR ?Heel slide  ?  ?ASSESSMENT: ?  ?CLINICAL IMPRESSION: ?Reviewed therapy goals and HEP. Patient tolerated session well today. She does note occasional hamstring cramping in knees flexed position. Added hamstring active stretching which helped. Patient cued on proper form and mechanics of all added exercise today. Issued updated HEP handout. Patient will continue to benefit from skilled therapy services to reduce remaining deficits and improve functional ability.  ? ?  ?  ?OBJECTIVE IMPAIRMENTS Abnormal gait, decreased activity tolerance, decreased balance, decreased mobility, difficulty walking, decreased ROM, decreased strength, hypomobility, increased fascial restrictions, impaired flexibility, improper body mechanics, and pain.  ?  ?ACTIVITY LIMITATIONS cleaning, community activity, driving, meal prep, laundry, yard work, shopping, and yard work.  ?  ?PERSONAL FACTORS Age are also affecting patient's functional outcome.  ?  ?  ?REHAB POTENTIAL: Good ?  ?CLINICAL DECISION MAKING: Stable/uncomplicated ?  ?EVALUATION COMPLEXITY: Low ?  ?  ?GOALS: ?SHORT TERM GOALS: Target date: 04/25/2022 ?  ?Patient will be independent with initial HEP and self-management strategies to improve functional outcomes ?Baseline:  ?Goal status: INITIAL  ?  ?LONG TERM GOALS: Target date: 05/16/2022 ?  ?Patient will be independent with advanced HEP and self-management strategies to improve functional outcomes ?Baseline:   ?Goal status: INITIAL ?  ?2.  Patient will improve FOTO score to predicted value to indicate improvement in functional outcomes ?Baseline: 50% ?  ?Goal status: INITIAL ?  ?3.  Patient will be able to perfo

## 2022-04-12 ENCOUNTER — Emergency Department (HOSPITAL_COMMUNITY): Payer: Medicare HMO

## 2022-04-12 ENCOUNTER — Encounter (HOSPITAL_COMMUNITY): Payer: Self-pay | Admitting: Emergency Medicine

## 2022-04-12 ENCOUNTER — Other Ambulatory Visit: Payer: Self-pay

## 2022-04-12 ENCOUNTER — Emergency Department (HOSPITAL_COMMUNITY)
Admission: EM | Admit: 2022-04-12 | Discharge: 2022-04-12 | Disposition: A | Discharge status: Home / Self Care | Payer: Medicare HMO | Attending: Emergency Medicine | Admitting: Emergency Medicine

## 2022-04-12 DIAGNOSIS — Z79899 Other long term (current) drug therapy: Secondary | ICD-10-CM | POA: Diagnosis not present

## 2022-04-12 DIAGNOSIS — W19XXXA Unspecified fall, initial encounter: Secondary | ICD-10-CM

## 2022-04-12 DIAGNOSIS — Z7982 Long term (current) use of aspirin: Secondary | ICD-10-CM | POA: Insufficient documentation

## 2022-04-12 DIAGNOSIS — R42 Dizziness and giddiness: Secondary | ICD-10-CM | POA: Diagnosis not present

## 2022-04-12 DIAGNOSIS — I10 Essential (primary) hypertension: Secondary | ICD-10-CM | POA: Diagnosis not present

## 2022-04-12 DIAGNOSIS — R519 Headache, unspecified: Secondary | ICD-10-CM | POA: Diagnosis not present

## 2022-04-12 DIAGNOSIS — W06XXXA Fall from bed, initial encounter: Secondary | ICD-10-CM | POA: Diagnosis not present

## 2022-04-12 NOTE — ED Provider Notes (Signed)
?[facility] ?Provider Note ? ? ?CSN: [id number] ?Arrival date & time: 04/12/22  1341 ? ?  ? ?History ? ?Chief Complaint  ?Patient presents with  ? Fall  ? ? ?[name] is a 81 y.o. female. ? ?Patient has a history of hypertension..  Patient states that she fell out of bed and hit her head when she was having a dream last night.  She only has complaints of mild headache ? ?The history is provided by the patient and medical records.  ?Fall ?This is a new problem. The current episode started less than 1 hour ago. The problem occurs constantly. The problem has been resolved. Associated symptoms include headaches. Pertinent negatives include no chest pain and no abdominal pain. Nothing relieves the symptoms. She has tried nothing for the symptoms.  ? ?  ? ?Home Medications ?Prior to Admission medications   ?Medication Sig Start Date End Date Taking? Authorizing Provider  ?acetaminophen (TYLENOL) 500 MG tablet Take 500-1,000 mg by mouth as needed for mild pain or moderate pain.    [provider]  ?albuterol (VENTOLIN HFA) 108 (90 Base) MCG/ACT inhaler Inhale 2 puffs into the lungs every 6 (six) hours as needed for wheezing or shortness of breath.    [provider]  ?ALPRAZolam Duanne Moron) 0.5 MG tablet Take 0.5 mg by mouth in the morning, at noon, and at bedtime. (1000, 1500, AT BEDTIME)    [provider]  ?amLODipine (NORVASC) 2.5 MG tablet Take 5 mg by mouth every evening. 02/09/13   [provider]  ?aspirin EC 81 MG tablet Take 81 mg by mouth daily. Swallow whole.    [provider]  ?azelastine (ASTELIN) 0.1 % nasal spray Place into both nostrils. 01/26/22   [provider]  ?cetirizine (ZYRTEC) 5 MG tablet Take 1 tablet (5 mg total) by mouth daily. 01/20/22   Jaynee Eagles, PA-C  ?chlorhexidine (PERIDEX) 0.12 % solution Use as directed 15 mLs in the mouth or throat as needed (mouth ulcers).    [provider]  ?Cholecalciferol  (VITAMIN D3) 50 MCG (2000 UT) capsule Take 2,000 Units by mouth daily.    [provider]  ?diclofenac Sodium (VOLTAREN) 1 % GEL Apply 1 application topically as needed (back pain).    [provider]  ?fluocinonide gel (LIDEX) 0.05 % Apply topically. 03/24/22   [provider]  ?fluticasone (FLONASE) 50 MCG/ACT nasal spray Place 2 sprays into both nostrils daily as needed for allergies.    [name], MD  ?levothyroxine (SYNTHROID) 50 MCG tablet Take 1 tablet (50 mcg total) by mouth daily before breakfast. 03/26/22   Brita Romp, NP  ?losartan (COZAAR) 25 MG tablet Take 100 mg by mouth daily. 07/14/20   [provider]  ?losartan (COZAAR) 50 MG tablet Take 50 mg by mouth daily. 02/14/22   [provider]  ?Multiple Vitamin (MULTIVITAMIN WITH MINERALS) TABS tablet Take 1 tablet by mouth daily in the afternoon.    [provider]  ?mupirocin ointment (BACTROBAN) 2 % APPLY TO AFFECTED AREA DAILY 03/07/22   Trula Slade, DPM  ?NONFORMULARY OR COMPOUNDED ITEM Apply 1 application topically as needed (foot neuropathy). neuropathy cream dic 3%/bac 2%/gab %/lid 5%/meth 1%    [provider]  ?pantoprazole (PROTONIX) 40 MG tablet TAKE 1 TABLET BY MOUTH ONCE A DAY. 11/20/21   Annitta Needs, NP  ?polyethylene glycol (MIRALAX / GLYCOLAX) 17 g packet Take 17 g by mouth daily.  [provider]  ?PREVIDENT 5000 BOOSTER PLUS 1.1 % PSTE Place 1 application onto teeth every other day. 09/28/20   [provider]  ?raloxifene (EVISTA) 60 MG tablet Take 1 tablet (60 mg total) by mouth every morning. ?Patient taking differently: Take 60 mg by mouth every other day. IN THE MORNING 04/10/20   Estill Dooms, NP  ?sodium fluoride (FLUORISHIELD) 1.1 % GEL dental gel PreviDent 5000 Booster Plus 1.1 % dental paste ? Uses every other night at bedtime (prescribed by dentist)    [provider]  ?SYMBICORT 80-4.5 MCG/ACT inhaler INHALE 2 PUFFS  INTO THE LUNGS TWICE DAILY. ?Patient taking differently: Inhale 2 puffs into the lungs daily. 02/02/18   Tanda Rockers, MD  ?Turmeric Curcumin 500 MG CAPS Take 500 mg by mouth daily.    [provider]  ?venlafaxine XR (EFFEXOR-XR) 75 MG 24 hr capsule venlafaxine ER 75 mg capsule,extended release 24 hr    [provider]  ?   ? ?Allergies    ?Amoxicillin-pot clavulanate, Ace inhibitors, Cetirizine, Entex la, Gabapentin, Levofloxacin, and Meloxicam   ? ?Review of Systems   ?Review of Systems  ?Constitutional:  Negative for appetite change and fatigue.  ?HENT:  Negative for congestion, ear discharge and sinus pressure.   ?Eyes:  Negative for discharge.  ?Respiratory:  Negative for cough.   ?Cardiovascular:  Negative for chest pain.  ?Gastrointestinal:  Negative for abdominal pain and diarrhea.  ?Genitourinary:  Negative for frequency and hematuria.  ?Musculoskeletal:  Negative for back pain.  ?Skin:  Negative for rash.  ?Neurological:  Positive for headaches. Negative for seizures.  ?Psychiatric/Behavioral:  Negative for hallucinations.   ? ?Physical Exam ?Updated Vital Signs ?BP 114/77   Pulse 73   Temp 97.7 ?F (36.5 ?C) (Oral)   Resp 16   Ht '5\' 4"'$  (1.626 m)   Wt 63.5 kg   SpO2 95%   BMI 24.03 kg/m?  ?Physical Exam ?Vitals and nursing note reviewed.  ?Constitutional:   ?   Appearance: She is well-developed.  ?HENT:  ?   Head: Normocephalic.  ?   Nose: Nose normal.  ?Eyes:  ?   General: No scleral icterus. ?   Conjunctiva/sclera: Conjunctivae normal.  ?Neck:  ?   Thyroid: No thyromegaly.  ?Cardiovascular:  ?   Rate and Rhythm: Normal rate and regular rhythm.  ?   Heart sounds: No murmur heard. ?  No friction rub. No gallop.  ?Pulmonary:  ?   Breath sounds: No stridor. No wheezing or rales.  ?Chest:  ?   Chest wall: No tenderness.  ?Abdominal:  ?   General: There is no distension.  ?   Tenderness: There is no abdominal tenderness. There is no rebound.  ?Musculoskeletal:     ?   General: Normal  range of motion.  ?   Cervical back: Neck supple.  ?Lymphadenopathy:  ?   Cervical: No cervical adenopathy.  ?Skin: ?   Findings: No erythema or rash.  ?Neurological:  ?   Mental Status: She is alert and oriented to person, place, and time.  ?   Motor: No abnormal muscle tone.  ?   Coordination: Coordination normal.  ?Psychiatric:     ?   Behavior: Behavior normal.  ? ? ?ED Results / Procedures / Treatments   ?Labs ?(all labs ordered are listed, but only abnormal results are displayed) ?Labs Reviewed - No data to display ? ?EKG ?None ? ?Radiology ?CT Head Wo Contrast ? ?Result Date:  04/12/2022 ?CLINICAL DATA:  [place] out of bed and struck head.  Dizziness. EXAM: CT HEAD WITHOUT CONTRAST TECHNIQUE: Contiguous axial images were obtained from the base of the skull through the vertex without intravenous contrast. RADIATION DOSE REDUCTION: This exam was performed according to the departmental dose-optimization program which includes automated exposure control, adjustment of the mA and/or kV according to patient size and/or use of iterative reconstruction technique. COMPARISON:  08/08/2019 FINDINGS: Brain: There is no evidence for acute hemorrhage, hydrocephalus, mass lesion, or abnormal extra-axial fluid collection. No definite CT evidence for acute infarction. Patchy low attenuation in the deep hemispheric and periventricular white matter is nonspecific, but likely reflects chronic microvascular ischemic demyelination. Vascular: No hyperdense vessel or unexpected calcification. Skull: No evidence for fracture. No worrisome lytic or sclerotic lesion. Sinuses/Orbits: The visualized paranasal sinuses and mastoid air cells are clear. Visualized portions of the globes and intraorbital fat are unremarkable. Other: None. IMPRESSION: 1. No acute intracranial abnormality. 2. Chronic small vessel white matter ischemic disease. Electronically Signed   By: Misty Stanley M.D.   On: 04/12/2022 17:14   ? ?Procedures ?Procedures   ? ? ?Medications Ordered in ED ?Medications - No data to display ? ?ED Course/ Medical Decision Making/ A&P ?  ?                        ?Medical Decision Making ?Amount and/or Complexity of Data Reviewed ?Radiology: ord

## 2022-04-12 NOTE — Discharge Instructions (Signed)
Take Tylenol for pain.  Follow-up with your family doctor if not improving.  We have also referred you to a neurologist if necessary ?

## 2022-04-12 NOTE — ED Triage Notes (Signed)
Pt states she fell out of bed around 0300 this morning. States she hit LT side of head and LT hip. Denies LOC or vision changes. Pt states her hip feels better, but is concerned that her head is still hurting. Pt not on blood thinners. AOx4. ?

## 2022-04-15 ENCOUNTER — Encounter (HOSPITAL_COMMUNITY): Payer: Self-pay | Admitting: Physical Therapy

## 2022-04-15 ENCOUNTER — Ambulatory Visit (HOSPITAL_COMMUNITY): Payer: Medicare HMO | Admitting: Physical Therapy

## 2022-04-15 DIAGNOSIS — R2689 Other abnormalities of gait and mobility: Secondary | ICD-10-CM | POA: Diagnosis not present

## 2022-04-15 DIAGNOSIS — M25561 Pain in right knee: Secondary | ICD-10-CM

## 2022-04-15 NOTE — Therapy (Signed)
?OUTPATIENT PHYSICAL THERAPY TREATMENT NOTE ? ? ?Patient Name: [name] ?MRN: [id number] ?DOB:Mar 01, 1941, 81 y.o., female ?Today's Date: 04/15/2022 ? ?PCP: Allyn Kenner MD  ?REFERRING PROVIDER: Rod Can, MD  ? ?END OF SESSION:  ? PT End of Session - 04/15/22 1046   ? ? Visit Number 3   ? Number of Visits 12   ? Date for PT Re-Evaluation 05/16/22   ? Authorization Type [employer]   ? Authorization Time Period Approved 12 5/4-6/15/23   ? Progress Note Due on Visit 10   ? PT Start Time 1046   ? PT Stop Time 4081   ? PT Time Calculation (min) 38 min   ? Activity Tolerance Patient tolerated treatment well   ? Behavior During Therapy [facility] for tasks assessed/performed   ? ?  ?  ? ?  ? ? ?Past Medical History:  ?Diagnosis Date  ? Anemia   ? Anxiety   ? Arthritis   ? Asthma   ? Constipation   ? GERD (gastroesophageal reflux disease)   ? High blood pressure   ? History of COVID-19 12/28/2020  ? Hypothyroidism   ? PAD (peripheral artery disease) ([place])   ? bilateral legs  ? Peripheral edema   ? Peripheral neuropathy   ? bilateral feet at night occ  ? Psoriasis   ? Seasonal allergies   ? Skin cancer   ? Left eye brow  ? Varicose veins   ? Vertigo 11/23/2013  ? ?Past Surgical History:  ?Procedure Laterality Date  ? APPENDECTOMY    ? patient unsure  ? BIOPSY  12/21/2020  ? Procedure: BIOPSY;  Surgeon: Daneil Dolin, MD;  [facility]: [facility];  Service: Endoscopy;;  ? BIOPSY  05/28/2021  ? Procedure: BIOPSY;  Surgeon: Eloise Harman, DO;  Location: [facility];  Service: Endoscopy;;  ? CATARACT EXTRACTION W/PHACO Left 01/27/2017  ? Procedure: CATARACT EXTRACTION PHACO AND INTRAOCULAR LENS PLACEMENT (IOC);  Surgeon: Tonny Branch, MD;  Location: [facility];  Service: Ophthalmology;  Laterality: Left;  CDE: 29.90  ? CATARACT EXTRACTION W/PHACO Right 02/24/2017  ? Procedure: CATARACT EXTRACTION PHACO AND INTRAOCULAR LENS PLACEMENT (IOC);  Surgeon: Tonny Branch, MD;  Location: [facility];  Service: Ophthalmology;   Laterality: Right;  CDE: 8.57  ? CHOLECYSTECTOMY    ? COLONOSCOPY N/A 10/01/2016  ? Procedure: COLONOSCOPY;  Surgeon: Daneil Dolin, MD; pancolonic diverticulosis, melanosis coli, otherwise normal exam.  No recommendations to repeat colonoscopy.  ? COLONOSCOPY WITH PROPOFOL N/A 12/21/2020  ? Rourk: Diverticulosis  ? ESOPHAGOGASTRODUODENOSCOPY (EGD) WITH PROPOFOL N/A 12/21/2020  ? Rourk: Erosive reflux esophagitis, nonbleeding gastric ulcers with biopsy showing erosion/ulceration, negative for H. pylori.  ? ESOPHAGOGASTRODUODENOSCOPY (EGD) WITH PROPOFOL N/A 05/28/2021  ? Carver: gastritis (reactive gastropathy), no H.pylori  ? FLEXOR TENDON REPAIR Right 11/06/2017  ? Procedure: RIGHT WRIST FLEXOR TENDON REPAIRAND STT [place];  Surgeon: Leanora Cover, MD;  Location: [facility];  Service: Orthopedics;  Laterality: Right;  ? MOUTH SURGERY    ? artery bleed  ? TOTAL HIP ARTHROPLASTY Right 01/31/2021  ? Procedure: TOTAL HIP ARTHROPLASTY ANTERIOR APPROACH;  Surgeon: Rod Can, MD;  Location: [facility];  Service: Orthopedics;  Laterality: Right;  ? TUBAL LIGATION    ? ?Patient Active Problem List  ? Diagnosis Date Noted  ? Gastritis and gastroduodenitis 06/14/2021  ? Gastric ulcer 04/13/2021  ? Abdominal pain 03/15/2021  ? Fracture of femoral neck, right ([place]) 01/31/2021  ? Closed displaced fracture of right femoral neck (  [place]) 01/31/2021  ? Indigestion 11/02/2020  ? Change in stool caliber 11/02/2020  ? Loss of weight 08/23/2020  ? Nausea without vomiting 06/15/2020  ? Tick bite 04/10/2020  ? Medication refill 04/10/2020  ? Acquired trigger finger 03/28/2020  ? Pain in right knee 10/08/2019  ? Acute maxillary sinusitis 08/20/2019  ? Cellulitis, leg 08/10/2019  ? Cellulitis 08/08/2019  ? Fever 08/08/2019  ? Cellulitis of foot 08/08/2019  ? Hypokalemia 08/08/2019  ? Hyponatremia 08/08/2019  ? Essential hypertension 08/08/2019  ? 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  ? Adverse food reaction 09/24/2018  ?  Mild persistent asthma without complication 54/08/8118  ? Pain of left hip joint 07/17/2018  ? Dyspnea on exertion 01/24/2017  ? Cough variant asthma  vs UACS 01/24/2017  ? High risk medication use 09/25/2016  ? Varicose veins of lower extremities with other complications 14/78/2956  ? Varicose veins 02/15/2014  ? Vertigo 11/23/2013  ? Pain in limb 10/04/2013  ? Hip bursitis 02/11/2013  ? Constipation 02/16/2009  ? RECTAL BLEEDING 02/16/2009  ? HIGH BLOOD PRESSURE 01/29/2008  ? ? 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 ?THERAPY DIAG:  ?Right knee pain, unspecified chronicity ? ?Other abnormalities of gait and mobility ? ?PERTINENT HISTORY: Rt THA  ? ?PRECAUTIONS: None  ? ?SUBJECTIVE: Has been doing good. Thinks exercises have been helping.  ?PAIN:  ?Are you having pain? No ? ?  ?OBJECTIVE:  ?  ?DIAGNOSTIC FINDINGS: NA ?  ?PATIENT SURVEYS:  ?FOTO 50% function  ?  ?COGNITION: ?          Overall cognitive status: Within functional limits for tasks assessed               ?           ?  ?PALPATION: ?Mod TTP about medial and lateral RT knee joint lines ?  ?LE ROM: ?  ?Active ROM Right ?04/04/2022 Left ?04/04/2022  ?Hip flexion      ?Hip extension      ?Hip abduction      ?Hip adduction      ?Hip internal rotation      ?Hip external rotation      ?Knee flexion 125 125  ?Knee extension 0 0  ?Ankle dorsiflexion      ?Ankle plantarflexion      ?Ankle inversion      ?Ankle eversion      ? (Blank rows = not tested) ?  ?LE MMT: ?  ?MMT Right ?04/04/2022 Left ?04/04/2022  ?Hip flexion 4 4  ?Hip extension 3- 3-  ?Hip abduction 4 4  ?Hip adduction      ?Hip internal rotation      ?Hip external rotation      ?Knee flexion      ?Knee extension 4 4  ?Ankle dorsiflexion 4 4  ?Ankle plantarflexion      ?Ankle inversion      ?Ankle eversion      ? (Blank rows = not tested) ?  ?  ?FUNCTIONAL TESTS:  ?5 times sit to stand: 14.3 sec with no hands  ?  ?GAIT: ?Decreased stride, antalgic, decreased hip and knee flexion bilat, no  AD  ?  ?  ?TODAY'S TREATMENT: ?04/15/22 ?Hamstring stretch (active)  x15 5 second holds ?SLR x 15 bilateral  ?HR 2x 10  ?Standing hip abduction 2x 10 bilateral  ?Step up 4 inch step 2x 10 bilateral  ?STS 2x 10  ? ? ?04/11/22 ?Quad set 15 x 5"  ?SLR  x 15 ?Heel slide x 15  ?Glute set 15 x 5"   ?Bridge x15 ?Clamshell x 20 each  ?Hamstring stretch (active)  x15  ? ? ?04/04/22 ?Quad set ?SLR ?Heel slide  ?  ?  ?PATIENT EDUCATION:  ?Education details: on evaluation findings, POC and HEP 5/11 on goals, exercise form and function 5/15 HEP ?Person educated: Patient ?Education method: Explanation ?Education comprehension: verbalized understanding and returned demonstration ?  ?  ?HOME EXERCISE PROGRAM: ?04/04/22 ?Quad set ? SLR ?Heel slide  ? ?5/15  ?STS ?  ?ASSESSMENT: ?  ?CLINICAL IMPRESSION: ?Patient with intermittent cueing for mechanics and positioning of previously completed exercises. Completed standing exercises today without c/o increased symptoms. Demonstrates impaired quad strength with stair exercises requiring bilateral UE support to complete. RLE dynamic knee valgus with STS which improves moderately with cueing. Patient will continue to benefit from physical therapy in order to improve function and reduce impairment. ? ? ?  ?  ?OBJECTIVE IMPAIRMENTS Abnormal gait, decreased activity tolerance, decreased balance, decreased mobility, difficulty walking, decreased ROM, decreased strength, hypomobility, increased fascial restrictions, impaired flexibility, improper body mechanics, and pain.  ?  ?ACTIVITY LIMITATIONS cleaning, community activity, driving, meal prep, laundry, yard work, shopping, and yard work.  ?  ?PERSONAL FACTORS Age are also affecting patient's functional outcome.  ?  ?  ?REHAB POTENTIAL: Good ?  ?CLINICAL DECISION MAKING: Stable/uncomplicated ?  ?EVALUATION COMPLEXITY: Low ?  ?  ?GOALS: ?SHORT TERM GOALS: Target date: 04/25/2022 ?  ?Patient will be independent with initial HEP and self-management  strategies to improve functional outcomes ?Baseline:  ?Goal status: Ongoing ?  ?LONG TERM GOALS: Target date: 05/16/2022 ?  ?Patient will be independent with advanced HEP and self-management strategies to improve f

## 2022-04-17 ENCOUNTER — Other Ambulatory Visit: Payer: Self-pay

## 2022-04-17 NOTE — Patient Outreach (Signed)
[place] [facility]) Care Management ? ?04/17/2022 ? ?[name] ?08-22-1941 ?[id number] ? ? ?Telephone call to patient for follow up.  Recent fall out of bed.  Discussed falls management.  Will be getting rail for bed.  Hypertension management discussed as well. No concerns. ? ?Care Plan : RN CM Plan of Care  ?Updates made by Jon Billings, RN since 04/17/2022 12:00 AM  ?  ? ?Problem: Chronic disease management and care coordination of HTN.   ?Priority: High  ?  ? ?Goal: Development of plan of care for management of falls   ?Start Date: 04/17/2022  ?Expected End Date: 07/01/2022  ?Priority: High  ?Note:   ?Current Barriers:  ?Knowledge Deficits related to plan of care for management of falls  ? ?RNCM Clinical Goal(s):  ?Patient will verbalize understanding of plan for management of Falls as evidenced by no falls within 30 days  through collaboration with RN Care manager, provider, and care team.  ? ?Interventions: ?Education and support related to falls ?Inter-disciplinary care team collaboration (see longitudinal plan of care) ?Evaluation of current treatment plan related to  self management and patient's adherence to plan as established by provider ? ? ?Falls Interventions:  (Status:  New goal.) Short Term Goal ?Provided written and verbal education re: potential causes of falls and Fall prevention strategies ?Advised patient of importance of notifying provider of falls ?Assessed for falls since last encounter ?Falls Prevention Discussed ?Keeping moving and stay active ?Keep your bones strong ?Go for regular eye checkups ?Always stand up slowly ?Wear proper non-slip footwear ?Light up your living space ?Install assistive devices.  ? ?04/17/22 Patient with recent fall out of bed.  Fall prevention discussed.  No concerns.   ? ?Patient Goals/Self-Care Activities: Falls ?Call provider office for new concerns or questions  ?Keeping moving and stay active ?Always stand up slowly ?Wear proper non-slip  footwear ?Light up your living space ?Install assistive devices.  ? ? ?Follow Up Plan:  Telephone follow up appointment with care management team member scheduled for:  July ?The patient has been provided with contact information for the care management team and has been advised to call with any health related questions or concerns.  ? ?  ? ?Long-Range Goal: Development of plan of care for management of HTN   ?Start Date: 02/19/2022  ?Expected End Date: 12/01/2022  ?This Visit's Progress: On track  ?Recent Progress: On track  ?Priority: High  ?Note:   ?Current Barriers:  ?Chronic Disease Management support and education needs related to HTN  ? ?RNCM Clinical Goal(s):  ?Patient will verbalize basic understanding of  HTN disease process and self health management plan as evidenced by Blood pressure less than 140/80 ?continue to work with RN Care Manager to address care management and care coordination needs related to  HTN as evidenced by adherence to CM Team Scheduled appointments through collaboration with RN Care manager, provider, and care team.  ? ?Interventions: ?Education and support related to HTN ?Inter-disciplinary care team collaboration (see longitudinal plan of care) ?Evaluation of current treatment plan related to  self management and patient's adherence to plan as established by provider ? ? ?Hypertension Interventions:  (Status:  Goal on track:  Yes.) Long Term Goal ?Last practice recorded BP readings:  ?BP Readings from Last 3 Encounters:  ?01/20/22 (!) 146/82  ?09/26/21 126/80  ?07/31/21 (!) 147/93  ?Most recent eGFR/CrCl: No results found for: EGFR  No components found for: CRCL ? ?Evaluation of current treatment plan related to hypertension  self management and patient's adherence to plan as established by provider ?Reviewed medications with patient and discussed importance of compliance ?Discussed plans with patient for ongoing care management follow up and provided patient with direct contact  information for care management team ? ?03/20/22 Patient reports blood pressure better but still fluctuates.  She sees the physician on tomorrow for follow up.  Last report BP 139/86. Discussed hypertension management and importance to reduce risk for heart attack and stroke.   ? ?04/17/22 Patient blood pressure better.  Last blood pressure 127/83.  Discussed hypertension management.  No concerns.   ? ? ?Patient Goals/Self-Care Activities: Hypertension ?Take all medications as prescribed ?Attend all scheduled provider appointments ?check blood pressure daily ?keep a blood pressure log ?take blood pressure log to all doctor appointments ?call doctor for signs and symptoms of high blood pressure ?Limit salt intake ? ?Follow Up Plan:  Telephone follow up appointment with care management team member scheduled for:  July ?The patient has been provided with contact information for the care management team and has been advised to call with any health related questions or concerns.  ? ?  ? ?Plan: Follow-up: Patient agrees to Care Plan and Follow-up. ?Follow-up in July.   ? ?Jone Baseman, RN, MSN ?[facility] Care Management ?Care Management Coordinator ?Direct Line [phone] ?Toll Free: [phone]  ?Fax: [phone] ?   ?

## 2022-04-17 NOTE — Patient Instructions (Addendum)
Patient Goals/Self-Care Activities: Falls ?Call provider office for new concerns or questions  ?Keeping moving and stay active ?Always stand up slowly ?Wear proper non-slip footwear ?Light up your living space ?Install assistive devices.  ? ?Patient Goals/Self-Care Activities: Hypertension ?Take all medications as prescribed ?Attend all scheduled provider appointments ?check blood pressure daily ?keep a blood pressure log ?take blood pressure log to all doctor appointments ?call doctor for signs and symptoms of high blood pressure ?Limit salt intake ?

## 2022-04-18 ENCOUNTER — Encounter (HOSPITAL_COMMUNITY): Payer: Medicare HMO | Admitting: Physical Therapy

## 2022-04-19 ENCOUNTER — Encounter (HOSPITAL_COMMUNITY): Payer: Self-pay

## 2022-04-19 ENCOUNTER — Ambulatory Visit (HOSPITAL_COMMUNITY): Payer: Medicare HMO

## 2022-04-19 DIAGNOSIS — M25561 Pain in right knee: Secondary | ICD-10-CM

## 2022-04-19 DIAGNOSIS — R2689 Other abnormalities of gait and mobility: Secondary | ICD-10-CM

## 2022-04-19 NOTE — Therapy (Addendum)
OUTPATIENT PHYSICAL THERAPY TREATMENT NOTE   Patient Name: [name] MRN: [id number] DOB:20-Jul-1941, 81 y.o., female Today's Date: 04/19/2022  PCP: Allyn Kenner MD  REFERRING PROVIDER: Rod Can, MD   END OF SESSION:   PT End of Session - 04/19/22 0947     Visit Number 4    Number of Visits 12    Date for PT Re-Evaluation 05/16/22    Authorization Type [employer]    Authorization Time Period Approved 12 5/4-6/15/23    Progress Note Due on Visit 10    PT Start Time 0948    PT Stop Time 1030    PT Time Calculation (min) 42 min    Activity Tolerance Patient tolerated treatment well    Behavior During Therapy [facility] for tasks assessed/performed             Past Medical History:  Diagnosis Date   Anemia    Anxiety    Arthritis    Asthma    Constipation    GERD (gastroesophageal reflux disease)    High blood pressure    History of COVID-19 12/28/2020   Hypothyroidism    PAD (peripheral artery disease) (HCC)    bilateral legs   Peripheral edema    Peripheral neuropathy    bilateral feet at night occ   Psoriasis    Seasonal allergies    Skin cancer    Left eye brow   Varicose veins    Vertigo 11/23/2013   Past Surgical History:  Procedure Laterality Date   APPENDECTOMY     patient unsure   BIOPSY  12/21/2020   Procedure: BIOPSY;  Surgeon: Daneil Dolin, MD;  [facility];  Service: Endoscopy;;   BIOPSY  05/28/2021   Procedure: BIOPSY;  Surgeon: Eloise Harman, DO;  [facility];  Service: Endoscopy;;   CATARACT EXTRACTION W/PHACO Left 01/27/2017   Procedure: CATARACT EXTRACTION PHACO AND INTRAOCULAR LENS PLACEMENT ([place]);  Surgeon: Tonny Branch, MD;  Location: [facility];  Service: Ophthalmology;  Laterality: Left;  CDE: 29.90   CATARACT EXTRACTION W/PHACO Right 02/24/2017   Procedure: CATARACT EXTRACTION PHACO AND INTRAOCULAR LENS PLACEMENT ([facility]);  Surgeon: Tonny Branch, MD;  Location: [facility];  Service: Ophthalmology;   Laterality: Right;  CDE: 8.57   CHOLECYSTECTOMY     COLONOSCOPY N/A 10/01/2016   Procedure: COLONOSCOPY;  Surgeon: Daneil Dolin, MD; pancolonic diverticulosis, melanosis coli, otherwise normal exam.  No recommendations to repeat colonoscopy.   COLONOSCOPY WITH PROPOFOL N/A 12/21/2020   Rourk: Diverticulosis   ESOPHAGOGASTRODUODENOSCOPY (EGD) WITH PROPOFOL N/A 12/21/2020   Rourk: Erosive reflux esophagitis, nonbleeding gastric ulcers with biopsy showing erosion/ulceration, negative for H. pylori.   ESOPHAGOGASTRODUODENOSCOPY (EGD) WITH PROPOFOL N/A 05/28/2021   Carver: gastritis (reactive gastropathy), no H.pylori   FLEXOR TENDON REPAIR Right 11/06/2017   Procedure: RIGHT WRIST FLEXOR TENDON REPAIRAND STT DEBRIEDMENT;  Surgeon: Leanora Cover, MD;  Location: [place];  Service: Orthopedics;  Laterality: Right;   MOUTH SURGERY     artery bleed   TOTAL HIP ARTHROPLASTY Right 01/31/2021   Procedure: TOTAL HIP ARTHROPLASTY ANTERIOR APPROACH;  Surgeon: Rod Can, MD;  [facility];  Service: Orthopedics;  Laterality: Right;   TUBAL LIGATION     Patient Active Problem List   Diagnosis Date Noted   Gastritis and gastroduodenitis 06/14/2021   Gastric ulcer 04/13/2021   Abdominal pain 03/15/2021   Fracture of femoral neck, right ([place]) 01/31/2021   Closed displaced fracture of right femoral neck (  [place]) 01/31/2021   Indigestion 11/02/2020   Change in stool caliber 11/02/2020   Loss of weight 08/23/2020   Nausea without vomiting 06/15/2020   Tick bite 04/10/2020   Medication refill 04/10/2020   Acquired trigger finger 03/28/2020   Pain in right knee 10/08/2019   Acute maxillary sinusitis 08/20/2019   Cellulitis, leg 08/10/2019   Cellulitis 08/08/2019   Fever 08/08/2019   Cellulitis of foot 08/08/2019   Hypokalemia 08/08/2019   Hyponatremia 08/08/2019   Essential hypertension 08/08/2019   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   Adverse food reaction 09/24/2018    Mild persistent asthma without complication 16/09/9603   Pain of left hip joint 07/17/2018   Dyspnea on exertion 01/24/2017   Cough variant asthma  vs UACS 01/24/2017   High risk medication use 09/25/2016   Varicose veins of lower extremities with other complications 54/08/8118   Varicose veins 02/15/2014   Vertigo 11/23/2013   Pain in limb 10/04/2013   Hip bursitis 02/11/2013   Constipation 02/16/2009   RECTAL BLEEDING 02/16/2009   HIGH BLOOD PRESSURE 01/29/2008    REFERRING DIAG: 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 1/47. Patient fell on Wednesday getting into car was standing on sloped surface and landed on left buttock, has been using ice/heat and tylenol, some bruising.  PAIN:  Are you having pain? Yes: NPRS scale: 2/10 Pain location: right knee, left hip     OBJECTIVE:    DIAGNOSTIC FINDINGS: NA   PATIENT SURVEYS:  FOTO 50% function    COGNITION:           Overall cognitive status: Within functional limits for tasks assessed                            PALPATION: Mod TTP about medial and lateral RT knee joint lines   LE ROM:   Active ROM Right 04/04/2022 Left 04/04/2022  Hip flexion      Hip extension      Hip abduction      Hip adduction      Hip internal rotation      Hip external rotation      Knee flexion 125 125  Knee extension 0 0  Ankle dorsiflexion      Ankle plantarflexion      Ankle inversion      Ankle eversion       (Blank rows = not tested)   LE MMT:   MMT Right 04/04/2022 Left 04/04/2022  Hip flexion 4 4  Hip extension 3- 3-  Hip abduction 4 4  Hip adduction      Hip internal rotation      Hip external rotation      Knee flexion      Knee extension 4 4  Ankle dorsiflexion 4 4  Ankle  plantarflexion      Ankle inversion      Ankle eversion       (Blank rows = not tested)     FUNCTIONAL TESTS:  5 times sit to stand: 14.3 sec with no hands    GAIT: Decreased stride, antalgic, decreased hip and knee flexion bilat, no AD      TODAY'S TREATMENT: 04/19/22 Nustep level 3 x5 mins Side steps in //  bars red theraband 6'x8 Standing hip extensions red theraband 2x10  Standing hip flexion with red theraband 2x10 Bridges with glute set 3x10 Hamstring stretch with strap 5 sec holds x10 b/l Gentle figure 4 stretch in supine with strap x10 b/l  Bosu step ups in ll bars with Cg/mina cues for weigthshift   04/15/22 Hamstring stretch (active)  x15 5 second holds SLR x 15 bilateral  HR 2x 10  Standing hip abduction 2x 10 bilateral  Step up 4 inch step 2x 10 bilateral  STS 2x 10    04/11/22 Quad set 15 x 5"  SLR x 15 Heel slide x 15  Glute set 15 x 5"   Bridge x15 Clamshell x 20 each  Hamstring stretch (active)  x15    04/04/22 Quad set SLR Heel slide      PATIENT EDUCATION:  Education details: on evaluation findings, POC and HEP 5/11 on goals, exercise form and function 5/15 HEP. 5/19 updated HEP Person educated: Patient Education method: Explanation Education comprehension: verbalized understanding and returned demonstration     HOME EXERCISE PROGRAM:  04/19/22  Access Code: [email or web address] URL: [email or web address] Date: 04/19/2022 Prepared by: Leota Jacobsen  Exercises - Hip Extension with Resistance Loop  - 1 x daily - 7 x weekly - 3 sets - 10 reps - Standing Hip Flexion with Resistance Loop  - 1 x daily - 7 x weekly - 3 sets - 10 reps - Standing Hip Abduction Kicks  - 1 x daily - 7 x weekly - 3 sets - 10 reps  04/04/22 Quad set  SLR Heel slide   5/15  STS   ASSESSMENT:   CLINICAL IMPRESSION: Patient with mild reports of right knee pain when completing nustep. Patient reports that she feels more off balance lately and attributes it  potentially to the changes in her BP medications. Pt reports she logs her BP daily and it has been good. Advised patient to have discussion with MD about new onset of feeling unbalance. Plan to incorporate balance exercise into patients current treatment as patient has had increased reports of falls. Patient will continue to benefit from skilled PT to improve strength, balance and decrease risk of falls.       OBJECTIVE IMPAIRMENTS Abnormal gait, decreased activity tolerance, decreased balance, decreased mobility, difficulty walking, decreased ROM, decreased strength, hypomobility, increased fascial restrictions, impaired flexibility, improper body mechanics, and pain.    ACTIVITY LIMITATIONS cleaning, community activity, driving, meal prep, laundry, yard work, shopping, and yard work.    PERSONAL FACTORS Age are also affecting patient's functional outcome.      REHAB POTENTIAL: Good   CLINICAL DECISION MAKING: Stable/uncomplicated   EVALUATION COMPLEXITY: Low     GOALS: SHORT TERM GOALS: Target date: 04/25/2022   Patient will be independent with initial HEP and self-management strategies to improve functional outcomes Baseline:  Goal status: Ongoing   LONG TERM GOALS: Target date: 05/16/2022   Patient will be independent with advanced HEP and self-management strategies to improve functional outcomes Baseline:  Goal status: Ongoing   2.  Patient will improve FOTO score to predicted value to indicate improvement in functional outcomes Baseline: 50%   Goal status: Ongoing   3.  Patient will be able to perform stand x 5 in < 12 seconds to demonstrate improvement in functional mobility and reduced risk for falls.  Baseline: 14.3 sec Goal status: Ongoing   4. Patient will have equal to or > 4+/5 MMT throughout BLE to improve ability to perform  functional mobility, stair ambulation and ADLs.  Baseline: See MMT  Goal status: Ongoing     PLAN: PT FREQUENCY: 2x/week   PT  DURATION: 6 weeks   PLANNED INTERVENTIONS: Therapeutic exercises, Therapeutic activity, Neuromuscular re-education, Balance training, Gait training, Patient/Family education, Joint manipulation, Joint mobilization, Stair training, Aquatic Therapy, Dry Needling, Electrical stimulation, Spinal manipulation, Spinal mobilization, Cryotherapy, Moist heat, scar mobilization, Taping, Traction, Ultrasound, Biofeedback, Ionotophoresis '4mg'$ /ml Dexamethasone, and Manual therapy.     PLAN FOR NEXT SESSION: continue to incorporate balance exercises. Glute, quad and hamstring strength   9:48 AM, 04/19/22 Tabbitha Janvrin PT, DPT

## 2022-04-24 ENCOUNTER — Encounter (HOSPITAL_COMMUNITY): Payer: Self-pay | Admitting: Physical Therapy

## 2022-04-24 ENCOUNTER — Ambulatory Visit (HOSPITAL_COMMUNITY): Payer: Medicare HMO | Admitting: Physical Therapy

## 2022-04-24 DIAGNOSIS — M25561 Pain in right knee: Secondary | ICD-10-CM

## 2022-04-24 DIAGNOSIS — R2689 Other abnormalities of gait and mobility: Secondary | ICD-10-CM

## 2022-04-24 NOTE — Therapy (Signed)
OUTPATIENT PHYSICAL THERAPY TREATMENT NOTE   Patient Name: RYELEE French MRN: [id number] DOB:1941/04/27, 81 y.o., female Today's Date: 04/24/2022  PCP: Allyn Kenner MD  REFERRING PROVIDER: Rod Can, MD   END OF SESSION:   PT End of Session - 04/24/22 1054     Visit Number 5    Number of Visits 12    Date for PT Re-Evaluation 05/16/22    Authorization Type [employer]    Authorization Time Period Approved 12 5/4-6/15/23    Progress Note Due on Visit 10    PT Start Time 1053    PT Stop Time 4098    PT Time Calculation (min) 38 min    Activity Tolerance Patient tolerated treatment well    Behavior During Therapy [facility] for tasks assessed/performed             Past Medical History:  Diagnosis Date   Anemia    Anxiety    Arthritis    Asthma    Constipation    GERD (gastroesophageal reflux disease)    High blood pressure    History of COVID-19 12/28/2020   Hypothyroidism    PAD (peripheral artery disease) (HCC)    bilateral legs   Peripheral edema    Peripheral neuropathy    bilateral feet at night occ   Psoriasis    Seasonal allergies    Skin cancer    Left eye brow   Varicose veins    Vertigo 11/23/2013   Past Surgical History:  Procedure Laterality Date   APPENDECTOMY     patient unsure   BIOPSY  12/21/2020   Procedure: BIOPSY;  Surgeon: Daneil Dolin, MD;  [facility];  Service: Endoscopy;;   BIOPSY  05/28/2021   Procedure: BIOPSY;  Surgeon: Eloise Harman, DO;  [facility];  Service: Endoscopy;;   CATARACT EXTRACTION W/PHACO Left 01/27/2017   Procedure: CATARACT EXTRACTION PHACO AND INTRAOCULAR LENS PLACEMENT ([place]);  Surgeon: Tonny Branch, MD;  [facility];  Service: Ophthalmology;  Laterality: Left;  CDE: 29.90   CATARACT EXTRACTION W/PHACO Right 02/24/2017   Procedure: CATARACT EXTRACTION PHACO AND INTRAOCULAR LENS PLACEMENT ([facility]);  Surgeon: Tonny Branch, MD;  [facility];  Service: Ophthalmology;   Laterality: Right;  CDE: 8.57   CHOLECYSTECTOMY     COLONOSCOPY N/A 10/01/2016   Procedure: COLONOSCOPY;  Surgeon: Daneil Dolin, MD; pancolonic diverticulosis, melanosis coli, otherwise normal exam.  No recommendations to repeat colonoscopy.   COLONOSCOPY WITH PROPOFOL N/A 12/21/2020   Rourk: Diverticulosis   ESOPHAGOGASTRODUODENOSCOPY (EGD) WITH PROPOFOL N/A 12/21/2020   Rourk: Erosive reflux esophagitis, nonbleeding gastric ulcers with biopsy showing erosion/ulceration, negative for H. pylori.   ESOPHAGOGASTRODUODENOSCOPY (EGD) WITH PROPOFOL N/A 05/28/2021   Carver: gastritis (reactive gastropathy), no H.pylori   FLEXOR TENDON REPAIR Right 11/06/2017   Procedure: RIGHT WRIST FLEXOR TENDON REPAIRAND STT DEBRIEDMENT;  Surgeon: Leanora Cover, MD;  Location: [place];  Service: Orthopedics;  Laterality: Right;   MOUTH SURGERY     artery bleed   TOTAL HIP ARTHROPLASTY Right 01/31/2021   Procedure: TOTAL HIP ARTHROPLASTY ANTERIOR APPROACH;  Surgeon: Rod Can, MD;  [facility];  Service: Orthopedics;  Laterality: Right;   TUBAL LIGATION     Patient Active Problem List   Diagnosis Date Noted   Gastritis and gastroduodenitis 06/14/2021   Gastric ulcer 04/13/2021   Abdominal pain 03/15/2021   Fracture of femoral neck, right ([place]) 01/31/2021   Closed displaced fracture of right femoral neck (  [place]) 01/31/2021   Indigestion 11/02/2020   Change in stool caliber 11/02/2020   Loss of weight 08/23/2020   Nausea without vomiting 06/15/2020   Tick bite 04/10/2020   Medication refill 04/10/2020   Acquired trigger finger 03/28/2020   Pain in right knee 10/08/2019   Acute maxillary sinusitis 08/20/2019   Cellulitis, leg 08/10/2019   Cellulitis 08/08/2019   Fever 08/08/2019   Cellulitis of foot 08/08/2019   Hypokalemia 08/08/2019   Hyponatremia 08/08/2019   Essential hypertension 08/08/2019   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   Adverse food reaction 09/24/2018    Mild persistent asthma without complication 20/35/5974   Pain of left hip joint 07/17/2018   Dyspnea on exertion 01/24/2017   Cough variant asthma  vs UACS 01/24/2017   High risk medication use 09/25/2016   Varicose veins of lower extremities with other complications 16/38/4536   Varicose veins 02/15/2014   Vertigo 11/23/2013   Pain in limb 10/04/2013   Hip bursitis 02/11/2013   Constipation 02/16/2009   RECTAL BLEEDING 02/16/2009   HIGH BLOOD PRESSURE 01/29/2008    REFERRING DIAG: 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   PAIN:  Are you having pain? Yes: NPRS scale: 2/10 Pain location: right knee, left hip     OBJECTIVE:    DIAGNOSTIC FINDINGS: NA   PATIENT SURVEYS:  FOTO 50% function    COGNITION:           Overall cognitive status: Within functional limits for tasks assessed                            PALPATION: Mod TTP about medial and lateral RT knee joint lines   LE ROM:   Active ROM Right 04/04/2022 Left 04/04/2022  Hip flexion      Hip extension      Hip abduction      Hip adduction      Hip internal rotation      Hip external rotation      Knee flexion 125 125  Knee extension 0 0  Ankle dorsiflexion      Ankle plantarflexion      Ankle inversion      Ankle eversion       (Blank rows = not tested)   LE MMT:   MMT Right 04/04/2022 Left 04/04/2022  Hip flexion 4 4  Hip extension 3- 3-  Hip abduction 4 4  Hip adduction      Hip internal rotation      Hip external rotation      Knee flexion      Knee extension 4 4  Ankle dorsiflexion 4 4  Ankle plantarflexion      Ankle inversion      Ankle eversion       (Blank rows = not tested)     FUNCTIONAL TESTS:  5 times sit to stand: 14.3 sec with no  hands    GAIT: Decreased stride, antalgic, decreased hip and knee flexion bilat, no AD      TODAY'S TREATMENT: 04/24/22 Nustep level 3 x5 mins Standing hip abduction with red theraband at knees 2x10 Standing hip extensions red theraband at knees 2x10  Standing hip flexion with red theraband 2x10  Step up 4 inch 3x 10 bilateral  Lateral step up 4 inch 2x 10    04/19/22 Nustep level 3 x5 mins Side steps in // bars red theraband 6'x8 Standing hip extensions red theraband 2x10  Standing hip flexion with red theraband 2x10 Bridges with glute set 3x10 Hamstring stretch with strap 5 sec holds x10 b/l Gentle figure 4 stretch in supine with strap x10 b/l  Bosu step ups in ll bars with Cg/mina cues for weigthshift   04/15/22 Hamstring stretch (active)  x15 5 second holds SLR x 15 bilateral  HR 2x 10  Standing hip abduction 2x 10 bilateral  Step up 4 inch step 2x 10 bilateral  STS 2x 10    04/11/22 Quad set 15 x 5"  SLR x 15 Heel slide x 15  Glute set 15 x 5"   Bridge x15 Clamshell x 20 each  Hamstring stretch (active)  x15    04/04/22 Quad set SLR Heel slide      PATIENT EDUCATION:  Education details: on evaluation findings, POC and HEP 5/11 on goals, exercise form and function 5/15 HEP. 5/19 updated HEP Person educated: Patient Education method: Explanation Education comprehension: verbalized understanding and returned demonstration     HOME EXERCISE PROGRAM:  04/19/22  Access Code: [email or web address] URL: [email or web address] Date: 04/19/2022 Prepared by: Leota Jacobsen  Exercises - Hip Extension with Resistance Loop  - 1 x daily - 7 x weekly - 3 sets - 10 reps - Standing Hip Flexion with Resistance Loop  - 1 x daily - 7 x weekly - 3 sets - 10 reps - Standing Hip Abduction Kicks  - 1 x daily - 7 x weekly - 3 sets - 10 reps  04/04/22 Quad set  SLR Heel slide   5/15  STS   ASSESSMENT:   CLINICAL IMPRESSION: Began session on nustep for dynamic warm  up and conditioning. Patient with difficulty with band at ankles for hip strengthening using poor mechanics but form improves with band at knees. Continues to require UE support with step exercises but tolerates additional reps. Patient stating improvement in symptoms following step up exercises. Patient will continue to benefit from physical therapy in order to improve function and reduce impairment.       OBJECTIVE IMPAIRMENTS Abnormal gait, decreased activity tolerance, decreased balance, decreased mobility, difficulty walking, decreased ROM, decreased strength, hypomobility, increased fascial restrictions, impaired flexibility, improper body mechanics, and pain.    ACTIVITY LIMITATIONS cleaning, community activity, driving, meal prep, laundry, yard work, shopping, and yard work.    PERSONAL FACTORS Age are also affecting patient's functional outcome.      REHAB POTENTIAL: Good   CLINICAL DECISION MAKING: Stable/uncomplicated   EVALUATION COMPLEXITY: Low     GOALS: SHORT TERM GOALS: Target date: 04/25/2022   Patient will be independent with initial HEP and self-management strategies to improve functional outcomes Baseline:  Goal status: Ongoing   LONG TERM GOALS: Target date: 05/16/2022   Patient will be independent with advanced HEP and self-management strategies to improve functional outcomes Baseline:  Goal status: Ongoing   2.  Patient will improve FOTO score to predicted value to indicate improvement in functional outcomes Baseline: 50%   Goal status: Ongoing   3.  Patient will be able to perform stand x 5 in < 12 seconds to demonstrate improvement in functional mobility and reduced risk for falls.  Baseline: 14.3 sec Goal status: Ongoing   4. Patient will have equal to or > 4+/5 MMT throughout BLE  to improve ability to perform functional mobility, stair ambulation and ADLs.  Baseline: See MMT  Goal status: Ongoing     PLAN: PT FREQUENCY: 2x/week   PT DURATION:  6 weeks   PLANNED INTERVENTIONS: Therapeutic exercises, Therapeutic activity, Neuromuscular re-education, Balance training, Gait training, Patient/Family education, Joint manipulation, Joint mobilization, Stair training, Aquatic Therapy, Dry Needling, Electrical stimulation, Spinal manipulation, Spinal mobilization, Cryotherapy, Moist heat, scar mobilization, Taping, Traction, Ultrasound, Biofeedback, Ionotophoresis '4mg'$ /ml Dexamethasone, and Manual therapy.     PLAN FOR NEXT SESSION: continue to incorporate balance exercises. Glute, quad and hamstring strength   10:55 AM, 04/24/22 Mearl Latin PT, DPT Physical Therapist at [facility]

## 2022-04-26 ENCOUNTER — Ambulatory Visit (HOSPITAL_COMMUNITY): Payer: Medicare HMO

## 2022-04-26 ENCOUNTER — Encounter (HOSPITAL_COMMUNITY): Payer: Self-pay

## 2022-04-26 DIAGNOSIS — R2689 Other abnormalities of gait and mobility: Secondary | ICD-10-CM

## 2022-04-26 DIAGNOSIS — M25561 Pain in right knee: Secondary | ICD-10-CM

## 2022-04-26 NOTE — Therapy (Cosign Needed)
OUTPATIENT PHYSICAL THERAPY TREATMENT NOTE   Patient Name: [name] MRN: [id number] DOB:1940-12-29, 81 y.o., female Today's Date: 04/26/2022  PCP: Allyn Kenner MD  REFERRING PROVIDER: Rod Can, MD   END OF SESSION:   PT End of Session - 04/26/22 1256     Visit Number 6    Number of Visits 12    Date for PT Re-Evaluation 05/16/22    Authorization Type [employer]    Authorization Time Period Approved 12 5/4-6/15/23    Progress Note Due on Visit 10    PT Start Time 1300    PT Stop Time 1345    PT Time Calculation (min) 45 min    Activity Tolerance Patient tolerated treatment well    Behavior During Therapy [facility] for tasks assessed/performed             Past Medical History:  Diagnosis Date   Anemia    Anxiety    Arthritis    Asthma    Constipation    GERD (gastroesophageal reflux disease)    High blood pressure    History of COVID-19 12/28/2020   Hypothyroidism    PAD (peripheral artery disease) (HCC)    bilateral legs   Peripheral edema    Peripheral neuropathy    bilateral feet at night occ   Psoriasis    Seasonal allergies    Skin cancer    Left eye brow   Varicose veins    Vertigo 11/23/2013   Past Surgical History:  Procedure Laterality Date   APPENDECTOMY     patient unsure   BIOPSY  12/21/2020   Procedure: BIOPSY;  Surgeon: Daneil Dolin, MD;  [facility];  Service: Endoscopy;;   BIOPSY  05/28/2021   Procedure: BIOPSY;  Surgeon: Eloise Harman, DO;  [facility];  Service: Endoscopy;;   CATARACT EXTRACTION W/PHACO Left 01/27/2017   Procedure: CATARACT EXTRACTION PHACO AND INTRAOCULAR LENS PLACEMENT ([place]);  Surgeon: Tonny Branch, MD;  [facility];  Service: Ophthalmology;  Laterality: Left;  CDE: 29.90   CATARACT EXTRACTION W/PHACO Right 02/24/2017   Procedure: CATARACT EXTRACTION PHACO AND INTRAOCULAR LENS PLACEMENT (IOC);  Surgeon: Tonny Branch, MD;  [facility];  Service: Ophthalmology;   Laterality: Right;  CDE: 8.57   CHOLECYSTECTOMY     COLONOSCOPY N/A 10/01/2016   Procedure: COLONOSCOPY;  Surgeon: Daneil Dolin, MD; pancolonic diverticulosis, melanosis coli, otherwise normal exam.  No recommendations to repeat colonoscopy.   COLONOSCOPY WITH PROPOFOL N/A 12/21/2020   Rourk: Diverticulosis   ESOPHAGOGASTRODUODENOSCOPY (EGD) WITH PROPOFOL N/A 12/21/2020   Rourk: Erosive reflux esophagitis, nonbleeding gastric ulcers with biopsy showing erosion/ulceration, negative for H. pylori.   ESOPHAGOGASTRODUODENOSCOPY (EGD) WITH PROPOFOL N/A 05/28/2021   Carver: gastritis (reactive gastropathy), no H.pylori   FLEXOR TENDON REPAIR Right 11/06/2017   Procedure: RIGHT WRIST FLEXOR TENDON REPAIRAND STT DEBRIEDMENT;  Surgeon: Leanora Cover, MD;  Location: [place];  Service: Orthopedics;  Laterality: Right;   MOUTH SURGERY     artery bleed   TOTAL HIP ARTHROPLASTY Right 01/31/2021   Procedure: TOTAL HIP ARTHROPLASTY ANTERIOR APPROACH;  Surgeon: Rod Can, MD;  [facility];  Service: Orthopedics;  Laterality: Right;   TUBAL LIGATION     Patient Active Problem List   Diagnosis Date Noted   Gastritis and gastroduodenitis 06/14/2021   Gastric ulcer 04/13/2021   Abdominal pain 03/15/2021   Fracture of femoral neck, right ([place]) 01/31/2021   Closed displaced fracture of right femoral neck (  [place]) 01/31/2021   Indigestion 11/02/2020   Change in stool caliber 11/02/2020   Loss of weight 08/23/2020   Nausea without vomiting 06/15/2020   Tick bite 04/10/2020   Medication refill 04/10/2020   Acquired trigger finger 03/28/2020   Pain in right knee 10/08/2019   Acute maxillary sinusitis 08/20/2019   Cellulitis, leg 08/10/2019   Cellulitis 08/08/2019   Fever 08/08/2019   Cellulitis of foot 08/08/2019   Hypokalemia 08/08/2019   Hyponatremia 08/08/2019   Essential hypertension 08/08/2019   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   Adverse food reaction 09/24/2018    Mild persistent asthma without complication 38/75/6433   Pain of left hip joint 07/17/2018   Dyspnea on exertion 01/24/2017   Cough variant asthma  vs UACS 01/24/2017   High risk medication use 09/25/2016   Varicose veins of lower extremities with other complications 29/51/8841   Varicose veins 02/15/2014   Vertigo 11/23/2013   Pain in limb 10/04/2013   Hip bursitis 02/11/2013   Constipation 02/16/2009   RECTAL BLEEDING 02/16/2009   HIGH BLOOD PRESSURE 01/29/2008    REFERRING DIAG: 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 BP taken at start of PT sessions BP 140/67mHg. Pt reports knee is feeling much better. 2/10 pain at this time PAIN:  Are you having pain? Yes: NPRS scale: 2/10 Pain location: right knee, left hip     OBJECTIVE:    DIAGNOSTIC FINDINGS: NA   PATIENT SURVEYS:  FOTO 50% function    COGNITION:           Overall cognitive status: Within functional limits for tasks assessed                            PALPATION: Mod TTP about medial and lateral RT knee joint lines   LE ROM:   Active ROM Right 04/04/2022 Left 04/04/2022  Hip flexion      Hip extension      Hip abduction      Hip adduction      Hip internal rotation      Hip external rotation      Knee flexion 125 125  Knee extension 0 0  Ankle dorsiflexion      Ankle plantarflexion      Ankle inversion      Ankle eversion       (Blank rows = not tested)   LE MMT:   MMT Right 04/04/2022 Left 04/04/2022  Hip flexion 4 4  Hip extension 3- 3-  Hip abduction 4 4  Hip adduction      Hip internal rotation      Hip external rotation      Knee flexion      Knee extension 4 4  Ankle dorsiflexion 4 4  Ankle plantarflexion      Ankle inversion      Ankle eversion       (Blank rows = not  tested)     FUNCTIONAL TESTS:  5 times sit to stand: 14.3 sec with no hands    GAIT: Decreased stride, antalgic, decreased hip and knee flexion bilat, no AD      TODAY'S TREATMENT:  04/26/22 Nustep level 4 x5 mins Resisted walkouts 3 plates x5 ant and retro 6inch step up anterior and lateral  x20 eah with b/l UE support High knee marches with contralateral hand tap to knee 6' x4 Big hip abduction side steps in // bars 6'x4 TKE on right 3plates x30 Foam beam side steps x4 laps  All at SBA up to mina    04/24/22 Nustep level 3 x5 mins Standing hip abduction with red theraband at knees 2x10 Standing hip extensions red theraband at knees 2x10  Standing hip flexion with red theraband 2x10 Step up 4 inch 3x 10 bilateral  Lateral step up 4 inch 2x 10    04/19/22 Nustep level 3 x5 mins Side steps in // bars red theraband 6'x8 Standing hip extensions red theraband 2x10  Standing hip flexion with red theraband 2x10 Bridges with glute set 3x10 Hamstring stretch with strap 5 sec holds x10 b/l Gentle figure 4 stretch in supine with strap x10 b/l  Bosu step ups in ll bars with Cg/mina cues for weigthshift   04/15/22 Hamstring stretch (active)  x15 5 second holds SLR x 15 bilateral  HR 2x 10  Standing hip abduction 2x 10 bilateral  Step up 4 inch step 2x 10 bilateral  STS 2x 10    04/11/22 Quad set 15 x 5"  SLR x 15 Heel slide x 15  Glute set 15 x 5"   Bridge x15 Clamshell x 20 each  Hamstring stretch (active)  x15    04/04/22 Quad set SLR Heel slide      PATIENT EDUCATION:  Education details: on evaluation findings, POC and HEP 5/11 on goals, exercise form and function 5/15 HEP. 5/19 updated HEP Person educated: Patient Education method: Explanation Education comprehension: verbalized understanding and returned demonstration     HOME EXERCISE PROGRAM:  04/19/22  Access Code: [email or web address] URL: [email or web address] Date: 04/19/2022 Prepared by:  Leota Jacobsen  Exercises - Hip Extension with Resistance Loop  - 1 x daily - 7 x weekly - 3 sets - 10 reps - Standing Hip Flexion with Resistance Loop  - 1 x daily - 7 x weekly - 3 sets - 10 reps - Standing Hip Abduction Kicks  - 1 x daily - 7 x weekly - 3 sets - 10 reps  04/04/22 Quad set  SLR Heel slide   5/15  STS   ASSESSMENT:   CLINICAL IMPRESSION: Began session on nustep for dynamic warm up and conditioning. Patient reports some right knee pain with 20# tke but that motion is easy, added weight to be 30# and patient no longer feeling pain in right knee with tke. Patient is able to move quickly through 6inch step ups. Patient with most muscles soreness during large amplitude leg lifts into abduction. Patient would benefit from continued balance work as she has had multiple falls. Patient is fearful of standing on foam beam and would benefit from continued practice. Patient will continue to benefit from physical therapy in order to improve function and reduce impairment.       OBJECTIVE IMPAIRMENTS Abnormal gait, decreased activity tolerance, decreased balance, decreased mobility, difficulty walking, decreased ROM, decreased strength, hypomobility, increased fascial restrictions, impaired flexibility, improper body mechanics, and pain.    ACTIVITY LIMITATIONS cleaning, community activity, driving, meal prep, laundry, yard work, shopping, and yard work.    PERSONAL FACTORS Age are also affecting patient's functional outcome.      REHAB POTENTIAL: Good   CLINICAL DECISION MAKING: Stable/uncomplicated   EVALUATION COMPLEXITY: Low     GOALS: SHORT TERM GOALS: Target date: 04/25/2022   Patient will be independent with initial  HEP and self-management strategies to improve functional outcomes Baseline:  Goal status: Ongoing   LONG TERM GOALS: Target date: 05/16/2022   Patient will be independent with advanced HEP and self-management strategies to improve functional  outcomes Baseline:  Goal status: Ongoing   2.  Patient will improve FOTO score to predicted value to indicate improvement in functional outcomes Baseline: 50%   Goal status: Ongoing   3.  Patient will be able to perform stand x 5 in < 12 seconds to demonstrate improvement in functional mobility and reduced risk for falls.  Baseline: 14.3 sec Goal status: Ongoing   4. Patient will have equal to or > 4+/5 MMT throughout BLE to improve ability to perform functional mobility, stair ambulation and ADLs.  Baseline: See MMT  Goal status: Ongoing     PLAN: PT FREQUENCY: 2x/week   PT DURATION: 6 weeks   PLANNED INTERVENTIONS: Therapeutic exercises, Therapeutic activity, Neuromuscular re-education, Balance training, Gait training, Patient/Family education, Joint manipulation, Joint mobilization, Stair training, Aquatic Therapy, Dry Needling, Electrical stimulation, Spinal manipulation, Spinal mobilization, Cryotherapy, Moist heat, scar mobilization, Taping, Traction, Ultrasound, Biofeedback, Ionotophoresis '4mg'$ /ml Dexamethasone, and Manual therapy.     PLAN FOR NEXT SESSION: continue to incorporate balance exercises. Glute, quad and hamstring strength   3:36 PM, 04/26/22 Charmine Bockrath PT, DPT

## 2022-04-30 ENCOUNTER — Ambulatory Visit (HOSPITAL_COMMUNITY): Payer: Medicare HMO | Attending: Orthopaedic Surgery

## 2022-04-30 DIAGNOSIS — M25561 Pain in right knee: Secondary | ICD-10-CM | POA: Insufficient documentation

## 2022-04-30 DIAGNOSIS — R2689 Other abnormalities of gait and mobility: Secondary | ICD-10-CM

## 2022-04-30 NOTE — Therapy (Signed)
OUTPATIENT PHYSICAL THERAPY TREATMENT NOTE   Patient Name: [name] MRN: [id number] DOB:July 19, 1941, 81 y.o., female Today's Date: 04/30/2022  PCP: Allyn Kenner MD  REFERRING PROVIDER: Rod Can, MD   END OF SESSION:   PT End of Session - 04/30/22 1356     Visit Number 7    Number of Visits 12    Date for PT Re-Evaluation 05/16/22    Authorization Type [employer]    Authorization Time Period Approved 12 5/4-6/15/23    Progress Note Due on Visit 10    PT Start Time 1345    PT Stop Time 1427    PT Time Calculation (min) 42 min    Activity Tolerance Patient tolerated treatment well    Behavior During Therapy [facility] for tasks assessed/performed              Past Medical History:  Diagnosis Date   Anemia    Anxiety    Arthritis    Asthma    Constipation    GERD (gastroesophageal reflux disease)    High blood pressure    History of COVID-19 12/28/2020   Hypothyroidism    PAD (peripheral artery disease) (HCC)    bilateral legs   Peripheral edema    Peripheral neuropathy    bilateral feet at night occ   Psoriasis    Seasonal allergies    Skin cancer    Left eye brow   Varicose veins    Vertigo 11/23/2013   Past Surgical History:  Procedure Laterality Date   APPENDECTOMY     patient unsure   BIOPSY  12/21/2020   Procedure: BIOPSY;  Surgeon: Daneil Dolin, MD;  [facility];  Service: Endoscopy;;   BIOPSY  05/28/2021   Procedure: BIOPSY;  Surgeon: Eloise Harman, DO;  [facility];  Service: Endoscopy;;   CATARACT EXTRACTION W/PHACO Left 01/27/2017   Procedure: CATARACT EXTRACTION PHACO AND INTRAOCULAR LENS PLACEMENT ([place]);  Surgeon: Tonny Branch, MD;  [facility];  Service: Ophthalmology;  Laterality: Left;  CDE: 29.90   CATARACT EXTRACTION W/PHACO Right 02/24/2017   Procedure: CATARACT EXTRACTION PHACO AND INTRAOCULAR LENS PLACEMENT ([facility]);  Surgeon: Tonny Branch, MD;  [facility];  Service: Ophthalmology;   Laterality: Right;  CDE: 8.57   CHOLECYSTECTOMY     COLONOSCOPY N/A 10/01/2016   Procedure: COLONOSCOPY;  Surgeon: Daneil Dolin, MD; pancolonic diverticulosis, melanosis coli, otherwise normal exam.  No recommendations to repeat colonoscopy.   COLONOSCOPY WITH PROPOFOL N/A 12/21/2020   Rourk: Diverticulosis   ESOPHAGOGASTRODUODENOSCOPY (EGD) WITH PROPOFOL N/A 12/21/2020   Rourk: Erosive reflux esophagitis, nonbleeding gastric ulcers with biopsy showing erosion/ulceration, negative for H. pylori.   ESOPHAGOGASTRODUODENOSCOPY (EGD) WITH PROPOFOL N/A 05/28/2021   Carver: gastritis (reactive gastropathy), no H.pylori   FLEXOR TENDON REPAIR Right 11/06/2017   Procedure: RIGHT WRIST FLEXOR TENDON REPAIRAND STT DEBRIEDMENT;  Surgeon: Leanora Cover, MD;  Location: [place];  Service: Orthopedics;  Laterality: Right;   MOUTH SURGERY     artery bleed   TOTAL HIP ARTHROPLASTY Right 01/31/2021   Procedure: TOTAL HIP ARTHROPLASTY ANTERIOR APPROACH;  Surgeon: Rod Can, MD;  [facility];  Service: Orthopedics;  Laterality: Right;   TUBAL LIGATION     Patient Active Problem List   Diagnosis Date Noted   Gastritis and gastroduodenitis 06/14/2021   Gastric ulcer 04/13/2021   Abdominal pain 03/15/2021   Fracture of femoral neck, right ([place]) 01/31/2021   Closed displaced fracture of right femoral  neck ([place]) 01/31/2021   Indigestion 11/02/2020   Change in stool caliber 11/02/2020   Loss of weight 08/23/2020   Nausea without vomiting 06/15/2020   Tick bite 04/10/2020   Medication refill 04/10/2020   Acquired trigger finger 03/28/2020   Pain in right knee 10/08/2019   Acute maxillary sinusitis 08/20/2019   Cellulitis, leg 08/10/2019   Cellulitis 08/08/2019   Fever 08/08/2019   Cellulitis of foot 08/08/2019   Hypokalemia 08/08/2019   Hyponatremia 08/08/2019   Essential hypertension 08/08/2019   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   Adverse food reaction 09/24/2018    Mild persistent asthma without complication 62/26/3335   Pain of left hip joint 07/17/2018   Dyspnea on exertion 01/24/2017   Cough variant asthma  vs UACS 01/24/2017   High risk medication use 09/25/2016   Varicose veins of lower extremities with other complications 45/62/5638   Varicose veins 02/15/2014   Vertigo 11/23/2013   Pain in limb 10/04/2013   Hip bursitis 02/11/2013   Constipation 02/16/2009   RECTAL BLEEDING 02/16/2009   HIGH BLOOD PRESSURE 01/29/2008    REFERRING DIAG: 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 BP taken at start of PT sessions BP 140/39mHg. Pt reports knee is feeling much better. 2/10 pain at this time PAIN:  Are you having pain? Yes: NPRS scale: 4/10 Pain location: right knee, left hip     OBJECTIVE:    DIAGNOSTIC FINDINGS: NA   PATIENT SURVEYS:  FOTO 50% function    COGNITION:           Overall cognitive status: Within functional limits for tasks assessed                            PALPATION: Mod TTP about medial and lateral RT knee joint lines   LE ROM:   Active ROM Right 04/04/2022 Left 04/04/2022  Hip flexion      Hip extension      Hip abduction      Hip adduction      Hip internal rotation      Hip external rotation      Knee flexion 125 125  Knee extension 0 0  Ankle dorsiflexion      Ankle plantarflexion      Ankle inversion      Ankle eversion       (Blank rows = not tested)   LE MMT:   MMT Right 04/04/2022 Left 04/04/2022  Hip flexion 4 4  Hip extension 3- 3-  Hip abduction 4 4  Hip adduction      Hip internal rotation      Hip external rotation      Knee flexion      Knee extension 4 4  Ankle dorsiflexion 4 4  Ankle plantarflexion      Ankle inversion      Ankle eversion       (Blank rows = not  tested)     FUNCTIONAL TESTS:  5 times sit to stand: 14.3 sec with no hands    GAIT: Decreased stride, antalgic, decreased hip and knee flexion bilat, no AD      TODAY'S TREATMENT: 04/30/22 Nustep seat 6 level 4 arms 6 x 5'  Standing // bars Heel raises 2 x 10 Slant  board 3 x 20" 6inch step up anterior and lateral x 10 eah with b/l UE support Big hip abduction side steps in // bars 6'x4 added red theraband around knees Tandem stance x 1' each on foam TKE with bodycraft 3 plates x 20 Sit to stand from chair with red med ball flexed in Ue's 2 x 10     04/26/22 Nustep level 4 x5 mins Resisted walkouts 3 plates x5 ant and retro 6inch step up anterior and lateral x20 eah with b/l UE support High knee marches with contralateral hand tap to knee 6' x4 Big hip abduction side steps in // bars 6'x4 TKE on right 3plates x30 Foam beam side steps x4 laps  All at SBA up to mina    04/24/22 Nustep level 3 x5 mins Standing hip abduction with red theraband at knees 2x10 Standing hip extensions red theraband at knees 2x10  Standing hip flexion with red theraband 2x10 Step up 4 inch 3x 10 bilateral  Lateral step up 4 inch 2x 10    04/19/22 Nustep level 3 x5 mins Side steps in // bars red theraband 6'x8 Standing hip extensions red theraband 2x10  Standing hip flexion with red theraband 2x10 Bridges with glute set 3x10 Hamstring stretch with strap 5 sec holds x10 b/l Gentle figure 4 stretch in supine with strap x10 b/l  Bosu step ups in ll bars with Cg/mina cues for weigthshift   04/15/22 Hamstring stretch (active)  x15 5 second holds SLR x 15 bilateral  HR 2x 10  Standing hip abduction 2x 10 bilateral  Step up 4 inch step 2x 10 bilateral  STS 2x 10    04/11/22 Quad set 15 x 5"  SLR x 15 Heel slide x 15  Glute set 15 x 5"   Bridge x15 Clamshell x 20 each  Hamstring stretch (active)  x15    04/04/22 Quad set SLR Heel slide      PATIENT EDUCATION:  Education  details: on evaluation findings, POC and HEP 5/11 on goals, exercise form and function 5/15 HEP. 5/19 updated HEP Person educated: Patient Education method: Explanation Education comprehension: verbalized understanding and returned demonstration     HOME EXERCISE PROGRAM:  04/19/22  Access Code: [email or web address] URL: [email or web address] Date: 04/19/2022 Prepared by: Leota Jacobsen  Exercises - Hip Extension with Resistance Loop  - 1 x daily - 7 x weekly - 3 sets - 10 reps - Standing Hip Flexion with Resistance Loop  - 1 x daily - 7 x weekly - 3 sets - 10 reps - Standing Hip Abduction Kicks  - 1 x daily - 7 x weekly - 3 sets - 10 reps  04/04/22 Quad set  SLR Heel slide   5/15  STS   ASSESSMENT:   CLINICAL IMPRESSION: Today's session continued to work on lower extremity strengthening and balance. Added tandem stance on foam to work on balance, added squats with med ball for strength. Reports no pain at the end of treatment today.   Patient will continue to benefit from physical therapy in order to improve function and reduce impairment.       OBJECTIVE IMPAIRMENTS Abnormal gait, decreased activity tolerance, decreased balance, decreased mobility, difficulty walking, decreased ROM, decreased strength, hypomobility, increased fascial restrictions, impaired flexibility, improper body mechanics, and pain.    ACTIVITY LIMITATIONS cleaning, community activity, driving, meal prep, laundry, yard work, shopping, and yard work.    PERSONAL FACTORS Age are also affecting patient's functional outcome.  REHAB POTENTIAL: Good   CLINICAL DECISION MAKING: Stable/uncomplicated   EVALUATION COMPLEXITY: Low     GOALS: SHORT TERM GOALS: Target date: 04/25/2022   Patient will be independent with initial HEP and self-management strategies to improve functional outcomes Baseline:  Goal status: Ongoing   LONG TERM GOALS: Target date: 05/16/2022   Patient will be independent  with advanced HEP and self-management strategies to improve functional outcomes Baseline:  Goal status: Ongoing   2.  Patient will improve FOTO score to predicted value to indicate improvement in functional outcomes Baseline: 50%   Goal status: Ongoing   3.  Patient will be able to perform stand x 5 in < 12 seconds to demonstrate improvement in functional mobility and reduced risk for falls.  Baseline: 14.3 sec Goal status: Ongoing   4. Patient will have equal to or > 4+/5 MMT throughout BLE to improve ability to perform functional mobility, stair ambulation and ADLs.  Baseline: See MMT  Goal status: Ongoing     PLAN: PT FREQUENCY: 2x/week   PT DURATION: 6 weeks   PLANNED INTERVENTIONS: Therapeutic exercises, Therapeutic activity, Neuromuscular re-education, Balance training, Gait training, Patient/Family education, Joint manipulation, Joint mobilization, Stair training, Aquatic Therapy, Dry Needling, Electrical stimulation, Spinal manipulation, Spinal mobilization, Cryotherapy, Moist heat, scar mobilization, Taping, Traction, Ultrasound, Biofeedback, Ionotophoresis '4mg'$ /ml Dexamethasone, and Manual therapy.     PLAN FOR NEXT SESSION: continue to incorporate balance exercises. Glute, quad and hamstring strength  1:57 PM, 04/30/22 [place] Zenor Small Maritta Kief MPT [place] [place] [phone]

## 2022-05-01 DIAGNOSIS — I10 Essential (primary) hypertension: Secondary | ICD-10-CM | POA: Diagnosis not present

## 2022-05-01 DIAGNOSIS — E119 Type 2 diabetes mellitus without complications: Secondary | ICD-10-CM | POA: Diagnosis not present

## 2022-05-01 DIAGNOSIS — E039 Hypothyroidism, unspecified: Secondary | ICD-10-CM | POA: Diagnosis not present

## 2022-05-01 DIAGNOSIS — E782 Mixed hyperlipidemia: Secondary | ICD-10-CM | POA: Diagnosis not present

## 2022-05-02 ENCOUNTER — Encounter (HOSPITAL_COMMUNITY): Payer: Medicare HMO

## 2022-05-02 ENCOUNTER — Telehealth (HOSPITAL_COMMUNITY): Payer: Self-pay | Admitting: Physical Therapy

## 2022-05-02 ENCOUNTER — Encounter (HOSPITAL_COMMUNITY): Payer: Medicare HMO | Admitting: Physical Therapy

## 2022-05-02 NOTE — Telephone Encounter (Signed)
Pt called at 20 minutes past her appt time and stated she was fixing to leave to come to her appt.  Explained to patient her appt started 20 minutes prior and pt reported that's not what she was told, that her appt was at 10:45 not 10:00.  Pt was eager to come in, however there were no available appointments available.  Teena Irani, PTA/CLT [place] Ph: [phone]

## 2022-05-06 ENCOUNTER — Ambulatory Visit (HOSPITAL_COMMUNITY): Payer: Medicare HMO | Attending: Orthopaedic Surgery | Admitting: Physical Therapy

## 2022-05-06 ENCOUNTER — Ambulatory Visit (HOSPITAL_COMMUNITY): Payer: Medicare HMO | Admitting: Physical Therapy

## 2022-05-06 DIAGNOSIS — R2689 Other abnormalities of gait and mobility: Secondary | ICD-10-CM | POA: Insufficient documentation

## 2022-05-06 DIAGNOSIS — M25561 Pain in right knee: Secondary | ICD-10-CM | POA: Insufficient documentation

## 2022-05-06 NOTE — Therapy (Signed)
OUTPATIENT PHYSICAL THERAPY TREATMENT NOTE   Patient Name: [name] MRN: [id number] DOB:September 14, 1941, 81 y.o., female Today's Date: 05/06/2022  PCP: Allyn Kenner MD  REFERRING PROVIDER: Rod Can, MD   END OF SESSION:   PT End of Session - 05/06/22 1003     Visit Number 8    Number of Visits 12    Date for PT Re-Evaluation 05/16/22    Authorization Type [employer]    Authorization Time Period Approved 12 5/4-6/15/23    Progress Note Due on Visit 10    PT Start Time 1002    PT Stop Time 1040    PT Time Calculation (min) 38 min    Activity Tolerance Patient tolerated treatment well    Behavior During Therapy WFL for tasks assessed/performed              Past Medical History:  Diagnosis Date   Anemia    Anxiety    Arthritis    Asthma    Constipation    GERD (gastroesophageal reflux disease)    High blood pressure    History of COVID-19 12/28/2020   Hypothyroidism    PAD (peripheral artery disease) (HCC)    bilateral legs   Peripheral edema    Peripheral neuropathy    bilateral feet at night occ   Psoriasis    Seasonal allergies    Skin cancer    Left eye brow   Varicose veins    Vertigo 11/23/2013   Past Surgical History:  Procedure Laterality Date   APPENDECTOMY     patient unsure   BIOPSY  12/21/2020   Procedure: BIOPSY;  Surgeon: Daneil Dolin, MD;  [facility];  Service: Endoscopy;;   BIOPSY  05/28/2021   Procedure: BIOPSY;  Surgeon: Eloise Harman, DO;  [facility];  Service: Endoscopy;;   CATARACT EXTRACTION W/PHACO Left 01/27/2017   Procedure: CATARACT EXTRACTION PHACO AND INTRAOCULAR LENS PLACEMENT ([place]);  Surgeon: Tonny Branch, MD;  [facility]: [facility];  Service: Ophthalmology;  Laterality: Left;  CDE: 29.90   CATARACT EXTRACTION W/PHACO Right 02/24/2017   Procedure: CATARACT EXTRACTION PHACO AND INTRAOCULAR LENS PLACEMENT ([facility]);  Surgeon: Tonny Branch, MD;  [facility]: [facility];  Service: Ophthalmology;   Laterality: Right;  CDE: 8.57   CHOLECYSTECTOMY     COLONOSCOPY N/A 10/01/2016   Procedure: COLONOSCOPY;  Surgeon: Daneil Dolin, MD; pancolonic diverticulosis, melanosis coli, otherwise normal exam.  No recommendations to repeat colonoscopy.   COLONOSCOPY WITH PROPOFOL N/A 12/21/2020   Rourk: Diverticulosis   ESOPHAGOGASTRODUODENOSCOPY (EGD) WITH PROPOFOL N/A 12/21/2020   Rourk: Erosive reflux esophagitis, nonbleeding gastric ulcers with biopsy showing erosion/ulceration, negative for H. pylori.   ESOPHAGOGASTRODUODENOSCOPY (EGD) WITH PROPOFOL N/A 05/28/2021   Carver: gastritis (reactive gastropathy), no H.pylori   FLEXOR TENDON REPAIR Right 11/06/2017   Procedure: RIGHT WRIST FLEXOR TENDON REPAIRAND STT DEBRIEDMENT;  Surgeon: Leanora Cover, MD;  Location: [place];  Service: Orthopedics;  Laterality: Right;   MOUTH SURGERY     artery bleed   TOTAL HIP ARTHROPLASTY Right 01/31/2021   Procedure: TOTAL HIP ARTHROPLASTY ANTERIOR APPROACH;  Surgeon: Rod Can, MD;  [facility];  Service: Orthopedics;  Laterality: Right;   TUBAL LIGATION     Patient Active Problem List   Diagnosis Date Noted   Gastritis and gastroduodenitis 06/14/2021   Gastric ulcer 04/13/2021   Abdominal pain 03/15/2021   Fracture of femoral neck, right ([place]) 01/31/2021   Closed displaced fracture of right femoral  neck ([place]) 01/31/2021   Indigestion 11/02/2020   Change in stool caliber 11/02/2020   Loss of weight 08/23/2020   Nausea without vomiting 06/15/2020   Tick bite 04/10/2020   Medication refill 04/10/2020   Acquired trigger finger 03/28/2020   Pain in right knee 10/08/2019   Acute maxillary sinusitis 08/20/2019   Cellulitis, leg 08/10/2019   Cellulitis 08/08/2019   Fever 08/08/2019   Cellulitis of foot 08/08/2019   Hypokalemia 08/08/2019   Hyponatremia 08/08/2019   Essential hypertension 08/08/2019   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   Adverse food reaction 09/24/2018    Mild persistent asthma without complication 27/25/3664   Pain of left hip joint 07/17/2018   Dyspnea on exertion 01/24/2017   Cough variant asthma  vs UACS 01/24/2017   High risk medication use 09/25/2016   Varicose veins of lower extremities with other complications 40/34/7425   Varicose veins 02/15/2014   Vertigo 11/23/2013   Pain in limb 10/04/2013   Hip bursitis 02/11/2013   Constipation 02/16/2009   RECTAL BLEEDING 02/16/2009   HIGH BLOOD PRESSURE 01/29/2008    REFERRING DIAG: 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  Reports she is keeping track of her BP at home and it's been doing good since they increased the dosage of her BP pills in April. PAIN:  Are you having pain? NO: NPRS scale: 0/10 Pain location: right knee, left hip     OBJECTIVE:    DIAGNOSTIC FINDINGS: NA   PATIENT SURVEYS:  FOTO 50% function    COGNITION:           Overall cognitive status: Within functional limits for tasks assessed                            PALPATION: Mod TTP about medial and lateral RT knee joint lines   LE ROM:   Active ROM Right 04/04/2022 Left 04/04/2022  Hip flexion      Hip extension      Hip abduction      Hip adduction      Hip internal rotation      Hip external rotation      Knee flexion 125 125  Knee extension 0 0  Ankle dorsiflexion      Ankle plantarflexion      Ankle inversion      Ankle eversion       (Blank rows = not tested)   LE MMT:   MMT Right 04/04/2022 Left 04/04/2022  Hip flexion 4 4  Hip extension 3- 3-  Hip abduction 4 4  Hip adduction      Hip internal rotation      Hip external rotation      Knee flexion      Knee extension 4 4  Ankle dorsiflexion 4 4  Ankle  plantarflexion      Ankle inversion      Ankle eversion       (Blank rows = not tested)     FUNCTIONAL TESTS:  5 times sit to stand: 14.3 sec with no hands    GAIT: Decreased stride, antalgic, decreased hip and knee flexion bilat, no AD      TODAY'S TREATMENT:  05/06/22 Standing // bars Heel raises 2 x 10 Slant board 3 x 20" Side stepping on blue line 2RT Side stepping in // bars RTB around knees 4RT 6inch step up anterior and lateral x 10 each with b/l UE suppor 6" forward step up Lt, 4" Rt 10X with 1 UE support Tandem stance on foam with UE flexion using 1# bar x 1' each on foam TKE with bodycraft 3 plates x 20 each LE with 5" holds Sit to stand from chair with red med ball flexed in UE's 2 x 10 Nustep seat 6 level 4 with UE x 5'   04/30/22 Nustep seat 6 level 4 arms 6 x 5'  Standing // bars Heel raises 2 x 10 Slant board 3 x 20" 6inch step up anterior and lateral x 10 eah with b/l UE support Big hip abduction side steps in // bars 6'x4 added red theraband around knees Tandem stance x 1' each on foam TKE with bodycraft 3 plates x 20 Sit to stand from chair with red med ball flexed in Ue's 2 x 10     04/26/22 Nustep level 4 x5 mins Resisted walkouts 3 plates x5 ant and retro 6inch step up anterior and lateral x20 eah with b/l UE support  High knee marches with contralateral hand tap to knee 6' x4 Big hip abduction side steps in // bars 6'x4 TKE on right 3plates x30 Foam beam side steps x4 laps  All at SBA up to [place]        PATIENT EDUCATION:  Education details: on evaluation findings, POC and HEP 5/11 on goals, exercise form and function 5/15 HEP. 5/19 updated HEP Person educated: Patient Education method: Explanation Education comprehension: verbalized understanding and returned demonstration     HOME EXERCISE PROGRAM: 04/19/22 Access Code: [email or web address] URL: [email or web address] Date: 04/19/2022 Prepared by: Leota Jacobsen Exercises -  Hip Extension with Resistance Loop  - 1 x daily - 7 x weekly - 3 sets - 10 reps - Standing Hip Flexion with Resistance Loop  - 1 x daily - 7 x weekly - 3 sets - 10 reps - Standing Hip Abduction Kicks  - 1 x daily - 7 x weekly - 3 sets - 10 reps  04/15/22: STS  5/4/2: Quad Set, SLR, heelslide     ASSESSMENT:   CLINICAL IMPRESSION: Continued focus on  lower extremity strengthening and balance. progressed tandem stance on foam with UE activity with noted challenge.  Step ups challenging with some pain reported with Rt forward step up using the 6" step; much improved when lowered to 4" step.  Pt able to complete sidestepping out on line today without LOB.  Overall improving.  Reports no pain at the end of treatment today.   Patient will continue to benefit from physical therapy in order to improve function and reduce impairment.       OBJECTIVE IMPAIRMENTS Abnormal gait, decreased activity tolerance, decreased balance, decreased mobility, difficulty walking, decreased ROM, decreased strength, hypomobility, increased fascial restrictions, impaired flexibility, improper body mechanics, and pain.    ACTIVITY LIMITATIONS cleaning, community activity, driving, meal prep, laundry, yard work, shopping, and yard work.    PERSONAL FACTORS Age are also affecting patient's functional outcome.      REHAB POTENTIAL: Good   CLINICAL DECISION MAKING: Stable/uncomplicated   EVALUATION COMPLEXITY: Low     GOALS: SHORT TERM GOALS: Target date: 04/25/2022   Patient will be independent with initial HEP and self-management strategies to improve functional outcomes Baseline:  Goal status: Ongoing   LONG TERM GOALS: Target date: 05/16/2022   Patient will be independent with advanced HEP and self-management strategies to improve functional outcomes Baseline:  Goal status: Ongoing   2.  Patient will improve FOTO score to predicted value to indicate improvement in functional outcomes Baseline: 50%    Goal status: Ongoing   3.  Patient will be able to perform stand x 5 in < 12 seconds to demonstrate improvement in functional mobility and reduced risk for falls.  Baseline: 14.3 sec Goal status: Ongoing   4. Patient will have equal to or > 4+/5 MMT throughout BLE to improve ability to perform functional mobility, stair ambulation and ADLs.  Baseline: See MMT  Goal status: Ongoing     PLAN: PT FREQUENCY: 2x/week   PT DURATION: 6 weeks   PLANNED INTERVENTIONS: Therapeutic exercises, Therapeutic activity, Neuromuscular re-education, Balance training, Gait training, Patient/Family education, Joint manipulation, Joint mobilization, Stair training, Aquatic Therapy, Dry Needling, Electrical stimulation, Spinal manipulation, Spinal mobilization, Cryotherapy, Moist heat, scar mobilization, Taping, Traction, Ultrasound, Biofeedback, Ionotophoresis '4mg'$ /ml Dexamethasone, and Manual therapy.     PLAN FOR NEXT SESSION: continue to incorporate balance exercises. Glute, quad and hamstring strength. Next session begin vectors.  10:04 AM, 05/06/22 Teena Irani, PTA/CLT [place] Ph: [phone]

## 2022-05-08 ENCOUNTER — Ambulatory Visit (HOSPITAL_COMMUNITY): Payer: Medicare HMO | Attending: Internal Medicine

## 2022-05-08 DIAGNOSIS — R2689 Other abnormalities of gait and mobility: Secondary | ICD-10-CM | POA: Insufficient documentation

## 2022-05-08 DIAGNOSIS — M25561 Pain in right knee: Secondary | ICD-10-CM | POA: Diagnosis not present

## 2022-05-08 NOTE — Therapy (Signed)
OUTPATIENT PHYSICAL THERAPY TREATMENT NOTE   Patient Name: [name] MRN: [id number] DOB:1941/06/22, 81 y.o., female Today's Date: 05/08/2022  PCP: Allyn Kenner MD  REFERRING PROVIDER: Rod Can, MD   END OF SESSION:   PT End of Session - 05/08/22 1301     Visit Number 9    Number of Visits 12    Date for PT Re-Evaluation 05/16/22    Authorization Type [employer]    Authorization Time Period Approved 12 5/4-6/15/23    Progress Note Due on Visit 10    PT Start Time 1302    PT Stop Time 1344    PT Time Calculation (min) 42 min    Activity Tolerance Patient tolerated treatment well    Behavior During Therapy [facility] for tasks assessed/performed               Past Medical History:  Diagnosis Date   Anemia    Anxiety    Arthritis    Asthma    Constipation    GERD (gastroesophageal reflux disease)    High blood pressure    History of COVID-19 12/28/2020   Hypothyroidism    PAD (peripheral artery disease) (HCC)    bilateral legs   Peripheral edema    Peripheral neuropathy    bilateral feet at night occ   Psoriasis    Seasonal allergies    Skin cancer    Left eye brow   Varicose veins    Vertigo 11/23/2013   Past Surgical History:  Procedure Laterality Date   APPENDECTOMY     patient unsure   BIOPSY  12/21/2020   Procedure: BIOPSY;  Surgeon: Daneil Dolin, MD;  [facility];  Service: Endoscopy;;   BIOPSY  05/28/2021   Procedure: BIOPSY;  Surgeon: Eloise Harman, DO;  [facility];  Service: Endoscopy;;   CATARACT EXTRACTION W/PHACO Left 01/27/2017   Procedure: CATARACT EXTRACTION PHACO AND INTRAOCULAR LENS PLACEMENT ([place]);  Surgeon: Tonny Branch, MD;  [facility];  Service: Ophthalmology;  Laterality: Left;  CDE: 29.90   CATARACT EXTRACTION W/PHACO Right 02/24/2017   Procedure: CATARACT EXTRACTION PHACO AND INTRAOCULAR LENS PLACEMENT ([facility]);  Surgeon: Tonny Branch, MD;  [facility];  Service: Ophthalmology;   Laterality: Right;  CDE: 8.57   CHOLECYSTECTOMY     COLONOSCOPY N/A 10/01/2016   Procedure: COLONOSCOPY;  Surgeon: Daneil Dolin, MD; pancolonic diverticulosis, melanosis coli, otherwise normal exam.  No recommendations to repeat colonoscopy.   COLONOSCOPY WITH PROPOFOL N/A 12/21/2020   Rourk: Diverticulosis   ESOPHAGOGASTRODUODENOSCOPY (EGD) WITH PROPOFOL N/A 12/21/2020   Rourk: Erosive reflux esophagitis, nonbleeding gastric ulcers with biopsy showing erosion/ulceration, negative for H. pylori.   ESOPHAGOGASTRODUODENOSCOPY (EGD) WITH PROPOFOL N/A 05/28/2021   Carver: gastritis (reactive gastropathy), no H.pylori   FLEXOR TENDON REPAIR Right 11/06/2017   Procedure: RIGHT WRIST FLEXOR TENDON REPAIRAND STT DEBRIEDMENT;  Surgeon: Leanora Cover, MD;  Location: [place];  Service: Orthopedics;  Laterality: Right;   MOUTH SURGERY     artery bleed   TOTAL HIP ARTHROPLASTY Right 01/31/2021   Procedure: TOTAL HIP ARTHROPLASTY ANTERIOR APPROACH;  Surgeon: Rod Can, MD;  [facility];  Service: Orthopedics;  Laterality: Right;   TUBAL LIGATION     Patient Active Problem List   Diagnosis Date Noted   Gastritis and gastroduodenitis 06/14/2021   Gastric ulcer 04/13/2021   Abdominal pain 03/15/2021   Fracture of femoral neck, right ([place]) 01/31/2021   Closed displaced fracture of right  femoral neck ([place]) 01/31/2021   Indigestion 11/02/2020   Change in stool caliber 11/02/2020   Loss of weight 08/23/2020   Nausea without vomiting 06/15/2020   Tick bite 04/10/2020   Medication refill 04/10/2020   Acquired trigger finger 03/28/2020   Pain in right knee 10/08/2019   Acute maxillary sinusitis 08/20/2019   Cellulitis, leg 08/10/2019   Cellulitis 08/08/2019   Fever 08/08/2019   Cellulitis of foot 08/08/2019   Hypokalemia 08/08/2019   Hyponatremia 08/08/2019   Essential hypertension 08/08/2019   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   Adverse food reaction 09/24/2018    Mild persistent asthma without complication 32/99/2426   Pain of left hip joint 07/17/2018   Dyspnea on exertion 01/24/2017   Cough variant asthma  vs UACS 01/24/2017   High risk medication use 09/25/2016   Varicose veins of lower extremities with other complications 83/41/9622   Varicose veins 02/15/2014   Vertigo 11/23/2013   Pain in limb 10/04/2013   Hip bursitis 02/11/2013   Constipation 02/16/2009   RECTAL BLEEDING 02/16/2009   HIGH BLOOD PRESSURE 01/29/2008    REFERRING DIAG: 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 Yes: NPRS scale: 3/10 Pain location: right knee, left hip     OBJECTIVE:    DIAGNOSTIC FINDINGS: NA   PATIENT SURVEYS:  FOTO 50% function    COGNITION:           Overall cognitive status: Within functional limits for tasks assessed                            PALPATION: Mod TTP about medial and lateral RT knee joint lines   LE ROM:   Active ROM Right 04/04/2022 Left 04/04/2022  Hip flexion      Hip extension      Hip abduction      Hip adduction      Hip internal rotation      Hip external rotation      Knee flexion 125 125  Knee extension 0 0  Ankle dorsiflexion      Ankle plantarflexion      Ankle inversion      Ankle eversion       (Blank rows = not tested)   LE MMT:   MMT Right 04/04/2022 Left 04/04/2022  Hip flexion 4 4  Hip extension 3- 3-  Hip abduction 4 4  Hip adduction      Hip internal rotation      Hip external rotation      Knee flexion      Knee extension 4 4  Ankle dorsiflexion 4 4  Ankle plantarflexion      Ankle inversion      Ankle eversion       (Blank rows = not tested)     FUNCTIONAL TESTS:  5 times sit to stand: 14.3 sec with no hands    GAIT: Decreased stride, antalgic, decreased hip and knee flexion  bilat, no AD      TODAY'S TREATMENT:  05/08/22 Nustep seat 6 level 4 with UE x 5'  Standing // bars Heel raises 2 x 10 Slant board 3 x 20" Hip vectors 2 x 5 bilaterally 4" step ups no UE support 2 x 10  4" Lateral step ups fingertip  support 2 x 10 sit to stand x 10 Red ball flexion with Ue's  x 10 Sit to stand x 10 on foam with red ball flexion with Ue's x 10      05/06/22 Standing // bars Heel raises 2 x 10 Slant board 3 x 20" Side stepping on blue line 2RT Side stepping in // bars RTB around knees 4RT 6inch step up anterior and lateral x 10 each with b/l UE suppor 6" forward step up Lt, 4" Rt 10X with 1 UE support Tandem stance on foam with UE flexion using 1# bar x 1' each on foam TKE with bodycraft 3 plates x 20 each LE with 5" holds Sit to stand from chair with red med ball flexed in UE's 2 x 10 Nustep seat 6 level 4 with UE x 5'   04/30/22 Nustep seat 6 level 4 arms 6 x 5'  Standing // bars Heel raises 2 x 10 Slant board 3 x 20" 6inch step up anterior and lateral x 10 eah with b/l UE support Big hip abduction side steps in // bars 6'x4 added red theraband around knees Tandem stance x 1' each on foam TKE with bodycraft 3 plates x 20 Sit to stand from chair with red med ball flexed in Ue's 2 x 10     04/26/22 Nustep level 4 x5 mins Resisted walkouts 3 plates x5 ant and retro 6inch step up anterior and lateral x20 eah with b/l UE support  High knee marches with contralateral hand tap to knee 6' x4 Big hip abduction side steps in // bars 6'x4 TKE on right 3plates x30 Foam beam side steps x4 laps  All at SBA up to [place]        PATIENT EDUCATION:  Education details: added hip vectors Person educated: Patient Education method: Explanation handout Education comprehension: verbalized understanding and returned demonstration     HOME EXERCISE PROGRAM: 04/19/22 Access Code: [email or web address] URL: [email or web address] Date: 04/19/2022 Prepared by:  Leota Jacobsen Exercises - Hip Extension with Resistance Loop  - 1 x daily - 7 x weekly - 3 sets - 10 reps - Standing Hip Flexion with Resistance Loop  - 1 x daily - 7 x weekly - 3 sets - 10 reps - Standing Hip Abduction Kicks  - 1 x daily - 7 x weekly - 3 sets - 10 reps  04/15/22: STS  5/4/2: Quad Set, SLR, heelslide     ASSESSMENT:   CLINICAL IMPRESSION: Continued focus on  lower extremity strengthening and balance.   Some complaint of soreness right knee today believes due to damp weather.  Added hip vectors for hip strengthening; encouraged decreased use of Ue's for balance with step ups; occasionally takes a misstep but no LOB noted. Overall progressing well.  Patient will continue to benefit from physical therapy in order to improve function and reduce impairment.       OBJECTIVE IMPAIRMENTS Abnormal gait, decreased activity tolerance, decreased balance, decreased mobility, difficulty walking, decreased ROM, decreased strength, hypomobility, increased fascial restrictions, impaired flexibility, improper body mechanics, and pain.    ACTIVITY LIMITATIONS cleaning, community activity, driving, meal prep, laundry, yard work, shopping, and yard work.    PERSONAL FACTORS Age are also affecting patient's functional outcome.      REHAB POTENTIAL: Good   CLINICAL DECISION MAKING: Stable/uncomplicated   EVALUATION COMPLEXITY: Low     GOALS: SHORT TERM GOALS: Target date: 04/25/2022   Patient will be independent with initial HEP and self-management strategies to improve  functional outcomes Baseline:  Goal status: Ongoing   LONG TERM GOALS: Target date: 05/16/2022   Patient will be independent with advanced HEP and self-management strategies to improve functional outcomes Baseline:  Goal status: Ongoing   2.  Patient will improve FOTO score to predicted value to indicate improvement in functional outcomes Baseline: 50%   Goal status: Ongoing   3.  Patient will be able to  perform stand x 5 in < 12 seconds to demonstrate improvement in functional mobility and reduced risk for falls.  Baseline: 14.3 sec Goal status: Ongoing   4. Patient will have equal to or > 4+/5 MMT throughout BLE to improve ability to perform functional mobility, stair ambulation and ADLs.  Baseline: See MMT  Goal status: Ongoing     PLAN: PT FREQUENCY: 2x/week   PT DURATION: 6 weeks   PLANNED INTERVENTIONS: Therapeutic exercises, Therapeutic activity, Neuromuscular re-education, Balance training, Gait training, Patient/Family education, Joint manipulation, Joint mobilization, Stair training, Aquatic Therapy, Dry Needling, Electrical stimulation, Spinal manipulation, Spinal mobilization, Cryotherapy, Moist heat, scar mobilization, Taping, Traction, Ultrasound, Biofeedback, Ionotophoresis '4mg'$ /ml Dexamethasone, and Manual therapy.     PLAN FOR NEXT SESSION: continue to incorporate balance exercises. Glute, quad and hamstring strength.  1:54 PM, 05/08/22 [place]a Hilbert Small Vaughn Beaumier MPT [facility] [place] [phone]

## 2022-05-09 DIAGNOSIS — M25552 Pain in left hip: Secondary | ICD-10-CM | POA: Diagnosis not present

## 2022-05-10 ENCOUNTER — Telehealth (HOSPITAL_COMMUNITY): Payer: Self-pay

## 2022-05-10 DIAGNOSIS — M545 Low back pain, unspecified: Secondary | ICD-10-CM | POA: Diagnosis not present

## 2022-05-10 DIAGNOSIS — K219 Gastro-esophageal reflux disease without esophagitis: Secondary | ICD-10-CM | POA: Diagnosis not present

## 2022-05-10 DIAGNOSIS — R600 Localized edema: Secondary | ICD-10-CM | POA: Diagnosis not present

## 2022-05-10 DIAGNOSIS — E039 Hypothyroidism, unspecified: Secondary | ICD-10-CM | POA: Diagnosis not present

## 2022-05-10 DIAGNOSIS — F418 Other specified anxiety disorders: Secondary | ICD-10-CM | POA: Diagnosis not present

## 2022-05-10 DIAGNOSIS — I10 Essential (primary) hypertension: Secondary | ICD-10-CM | POA: Diagnosis not present

## 2022-05-10 DIAGNOSIS — E876 Hypokalemia: Secondary | ICD-10-CM | POA: Diagnosis not present

## 2022-05-10 DIAGNOSIS — E119 Type 2 diabetes mellitus without complications: Secondary | ICD-10-CM | POA: Diagnosis not present

## 2022-05-10 DIAGNOSIS — L959 Vasculitis limited to the skin, unspecified: Secondary | ICD-10-CM | POA: Insufficient documentation

## 2022-05-10 DIAGNOSIS — J4530 Mild persistent asthma, uncomplicated: Secondary | ICD-10-CM | POA: Diagnosis not present

## 2022-05-10 NOTE — Telephone Encounter (Signed)
Returned patient phone call.  She had significant pain in her left hip with hip  vectors; went to urgent care. X-ray negative. Gave prednisone. Set up for MRI.  Feeling better today. Asked if she should come in for appintment Monday; instructed patient to continued to ice and if she feels better come in and if not cancel her appointment Monday morning; patient verbalizes agreement.   1:08 PM, 05/10/22 Keshun Berrett Small Yazeed Pryer MPT [place] [place] [phone]

## 2022-05-13 ENCOUNTER — Encounter (HOSPITAL_COMMUNITY): Payer: Medicare HMO | Admitting: Physical Therapy

## 2022-05-14 ENCOUNTER — Ambulatory Visit: Payer: Medicare HMO | Admitting: Podiatry

## 2022-05-14 DIAGNOSIS — M21619 Bunion of unspecified foot: Secondary | ICD-10-CM | POA: Diagnosis not present

## 2022-05-14 DIAGNOSIS — L84 Corns and callosities: Secondary | ICD-10-CM

## 2022-05-15 ENCOUNTER — Ambulatory Visit (HOSPITAL_COMMUNITY): Payer: Medicare HMO | Admitting: Physical Therapy

## 2022-05-22 DIAGNOSIS — S73192A Other sprain of left hip, initial encounter: Secondary | ICD-10-CM | POA: Diagnosis not present

## 2022-05-22 DIAGNOSIS — M7062 Trochanteric bursitis, left hip: Secondary | ICD-10-CM | POA: Diagnosis not present

## 2022-05-22 DIAGNOSIS — K4020 Bilateral inguinal hernia, without obstruction or gangrene, not specified as recurrent: Secondary | ICD-10-CM | POA: Diagnosis not present

## 2022-05-22 DIAGNOSIS — S76312A Strain of muscle, fascia and tendon of the posterior muscle group at thigh level, left thigh, initial encounter: Secondary | ICD-10-CM | POA: Diagnosis not present

## 2022-05-22 DIAGNOSIS — M1612 Unilateral primary osteoarthritis, left hip: Secondary | ICD-10-CM | POA: Diagnosis not present

## 2022-05-22 DIAGNOSIS — M25452 Effusion, left hip: Secondary | ICD-10-CM | POA: Diagnosis not present

## 2022-05-22 NOTE — Progress Notes (Signed)
Subjective: 81 year old female presents the office today for follow-up evaluation of calluses on her right foot.  She states that she has been doing well and the calluses not come back and she presents today asking if she needs to wear her inserts that she had made previously.  She denies any open lesions.  No swelling or redness.  No other concerns.  Objective: AAO x3, NAD DP/PT pulses palpable bilaterally, CRT less than 3 seconds There is minimal hyperkeratotic tissue present medial hallux IPJ and MPJ.  Overall this is doing much better.  There is no swelling redness or drainage.  No open lesions. Bunion is present.  There is no fluctuation or crepitation.  There is no malodor. No pain with calf compression, swelling, warmth, erythema  Assessment: Preulcerative callus right foot  Plan: -All treatment options discussed with the patient including all alternatives, risks, complications.  -There is minimal hyperkeratotic lesion overall she is doing much better.  We discussed moisturizer to the area to help if it reoccurs and continue offloading.  We discussed different shoes to avoid pressure as well if she were to wear different type of shoe.  Discussed daily foot inspection there is any reoccurrence to let me know.  Trula Slade DPM

## 2022-05-31 ENCOUNTER — Encounter: Payer: Self-pay | Admitting: Gastroenterology

## 2022-05-31 ENCOUNTER — Ambulatory Visit: Payer: Medicare HMO | Admitting: Gastroenterology

## 2022-05-31 VITALS — BP 160/88 | HR 75 | Temp 97.3°F | Ht 64.0 in | Wt 146.6 lb

## 2022-05-31 DIAGNOSIS — K259 Gastric ulcer, unspecified as acute or chronic, without hemorrhage or perforation: Secondary | ICD-10-CM | POA: Diagnosis not present

## 2022-05-31 DIAGNOSIS — K5900 Constipation, unspecified: Secondary | ICD-10-CM

## 2022-05-31 NOTE — Progress Notes (Signed)
GI Office Note    Referring Provider: [name], MD Primary Care Physician:  [name], MD  Primary Gastroenterologist: Garfield Cornea, MD   Chief Complaint   Chief Complaint  Patient presents with   Follow-up    Miralax is working well once daily.    History of Present Illness   [name] is a 81 y.o. female presenting today for follow up. She was last seen 07/2021 for follow up of RLQ pain. She also has history GERD, gastric ulcers, constipation.   At time of last visit, it was felt that her RLQ pain was likely abdominal wall pain. She had CT scans in Feb and April of 01/2021, EGD X 2 to follow up on gastric ulcers, colonoscopy. She was seeing Dr. Elonda Husky for abdominal wall lidocaine/beclomethasone injections and would get complete relief of her pain albeit short lived, approximately 24 hours or so. After her last visit, she was offered referral to pain management. She decided to see another GI, Dr. Benson Norway at the request of her daughters, as they also see him. Kind of for a "second opinion". Patient seen once, per limited information seen in [place], he was not concerned of significant finding given symptoms and previous work up. Patient states she prefers to be seen here locally by [place] (Dr. Gala Romney).   Patient states her abdominal pain resolved. Her constipation is well controlled on miralax, she takes daily. No heartburn. Appetite is good. No melena, brbpr. She wonders if she can stop pantoprazole. At time of gastric ulcers she was using ASA '81mg'$  daily but no other NSAIDs. She has been under a lot of stress due to her husband having advanced Alzheimer's. She states she fell recently onto her left hip. She had MRI 9 days ago and waiting for results.    EGD 12/2020:  -erosive reflux esophagitis -nonbleeding gastric ulcers, biopsy showed erosion/ulceration, negative for H. Pylori   Colonoscopy January 2022: -Diverticulosis   CT abdomen pelvis February 2022 with  subacute impaction of the right subcapital femoral neck, diverticulosis without diverticulitis, absent gallbladder.  Patient was referred to orthopedic.  She had a hip replacement and was told by Ortho that her pain had nothing to do with her hip replacement.   CT abdomen pelvis March 15, 2021 after being referred to the ED from our office for abdominal pain, stable intra and extrahepatic biliary duct dilation likely related to prior cholecystectomy, postsurgical changes seen from interval right hip arthroplasty.  Postoperative seroma anterior to the right hip prosthesis measuring 5.1 x 2.8 x 6.6 cm.   EGD 05/2021: -Gastritis. Biopsied. Reactive gastropathy, Neg for H.pylori -Normal duodenal bulb, first portion of the duodenum and second portion of the duodenum.     Medications   Current Outpatient Medications  Medication Sig Dispense Refill   acetaminophen (TYLENOL) 500 MG tablet Take 500-1,000 mg by mouth as needed for mild pain or moderate pain.     albuterol (VENTOLIN HFA) 108 (90 Base) MCG/ACT inhaler Inhale 2 puffs into the lungs every 6 (six) hours as needed for wheezing or shortness of breath.     ALPRAZolam (XANAX) 0.5 MG tablet Take 0.5 mg by mouth in the morning, at noon, and at bedtime. (1000, 1500, AT BEDTIME)     amLODipine (NORVASC) 5 MG tablet Take 5 mg by mouth 2 (two) times daily.     aspirin EC 81 MG tablet Take 81 mg by mouth daily. Swallow whole.     azelastine (ASTELIN)  0.1 % nasal spray Place into both nostrils.     Cholecalciferol (VITAMIN D3) 50 MCG (2000 UT) capsule Take 2,000 Units by mouth daily.     diclofenac Sodium (VOLTAREN) 1 % GEL Apply 1 application topically as needed (back pain).     levothyroxine (SYNTHROID) 50 MCG tablet Take 1 tablet (50 mcg total) by mouth daily before breakfast. 90 tablet 3   losartan (COZAAR) 50 MG tablet Take 50 mg by mouth 2 (two) times daily.     Multiple Vitamin (MULTIVITAMIN WITH MINERALS) TABS tablet Take 1 tablet by mouth  daily in the afternoon.     mupirocin ointment (BACTROBAN) 2 % APPLY TO AFFECTED AREA DAILY 22 g 0   pantoprazole (PROTONIX) 40 MG tablet TAKE 1 TABLET BY MOUTH ONCE A DAY. 30 tablet 5   polyethylene glycol (MIRALAX / GLYCOLAX) 17 g packet Take 17 g by mouth daily.     raloxifene (EVISTA) 60 MG tablet Take 1 tablet (60 mg total) by mouth every morning. (Patient taking differently: Take 60 mg by mouth every other day. IN THE MORNING) 30 tablet 12   SYMBICORT 80-4.5 MCG/ACT inhaler INHALE 2 PUFFS INTO THE LUNGS TWICE DAILY. (Patient taking differently: Inhale 2 puffs into the lungs daily.) 10.2 g 0   venlafaxine XR (EFFEXOR-XR) 75 MG 24 hr capsule venlafaxine ER 75 mg capsule,extended release 24 hr     No current facility-administered medications for this visit.    Allergies   Allergies as of 05/31/2022 - Review Complete 05/31/2022  Allergen Reaction Noted   Amoxicillin-pot clavulanate Nausea And Vomiting, Other (See Comments), Itching, and Nausea Only 10/04/2013   Ace inhibitors Cough    Cetirizine  03/26/2022   Entex la Itching    Gabapentin  03/26/2022   Levofloxacin Hives and Itching 01/21/2017   Meloxicam Other (See Comments) 04/25/2020     Review of Systems   General: Negative for anorexia, weight loss, fever, chills, fatigue, weakness. ENT: Negative for hoarseness, difficulty swallowing , nasal congestion. CV: Negative for chest pain, angina, palpitations, dyspnea on exertion, peripheral edema.  Respiratory: Negative for dyspnea at rest, dyspnea on exertion, cough, sputum, wheezing.  GI: See history of present illness. GU:  Negative for dysuria, hematuria, urinary incontinence, urinary frequency, nocturnal urination.  Endo: Negative for unusual weight change.     Physical Exam   BP (!) 160/88 (BP Location: Left Arm, Patient Position: Sitting, Cuff Size: Normal)   Pulse 75   Temp (!) 97.3 F (36.3 C) (Oral)   Ht '5\' 4"'$  (1.626 m)   Wt 146 lb 9.6 oz (66.5 kg)   SpO2 96%    BMI 25.16 kg/m    General: Well-nourished, well-developed in no acute distress.  Eyes: No icterus. Mouth: Oropharyngeal mucosa moist and pink , no lesions erythema or exudate. Abdomen: Bowel sounds are normal, nontender, nondistended, no hepatosplenomegaly or masses,  no abdominal bruits or hernia , no rebound or guarding.  Rectal: not performed  Extremities: No lower extremity edema. No clubbing or deformities. Neuro: Alert and oriented x 4   Skin: Warm and dry, no jaundice.   Psych: Alert and cooperative, normal mood and affect.  Labs   Lab Results  Component Value Date   TSH 1.740 03/18/2022   Lab Results  Component Value Date   WBC 9.3 03/15/2021   HGB 11.5 (L) 03/15/2021   HCT 36.5 03/15/2021   MCV 97.9 03/15/2021   PLT 199 03/15/2021   Lab Results  Component Value Date  ALT 15 03/15/2021   AST 24 03/15/2021   ALKPHOS 69 03/15/2021   BILITOT 0.4 03/15/2021   Lab Results  Component Value Date   LIPASE 23 03/15/2021   Lab Results  Component Value Date   CREATININE 0.66 03/15/2021   BUN 10 03/15/2021   NA 139 03/15/2021   K 3.3 (L) 03/15/2021   CL 102 03/15/2021   CO2 25 03/15/2021    Imaging Studies   No results found.  Assessment   Constipation: doing well on miralax. Ok to continue daily if needed.  Gastric ulcer/GERD: patient inquires about coming off PPI. She finds it hard to time around her synthroid and with breakfast etc. Unclear etiology for previous gastric ulcer/gastritis. Was taking ASA '81mg'$  daily but no other NSAIDs. Has a lot of stress. Encouraged trying to stay on PPI or reducing to every other day or so if tolerated. If she chooses to take every other day or come off, she should resume with any apparent recurrent appetite issues, weight loss, abdominal pain, frequent reflux.    PLAN   Pantoprazole '40mg'$  daily advised but consider taking before dinner or at bedtime for ease of use. If she wants to pursue taking on every other day, or come  off, she will need to watch for recurrent symptoms, at which time she should resume to daily use.  Miralax 17 grams daily as needed. Return to the office in six months or sooner if needed.    Laureen Ochs. Bobby Rumpf, [place], [facility]

## 2022-05-31 NOTE — Patient Instructions (Signed)
For constipation: you can continue miralax or generic one capful every day to maintain soft regular stools. For history of stomach ulcers: you can continue pantoprazole '40mg'$  daily, may be easier to move to before dinner or at bedtime so you don't have to worry about timing around your thyroid medication. As discussed today, if you choose to reduce pantoprazole to 3-4 days a week or stop altogether, watch for any abdominal pain/appetite concerns/weight loss. If you have any of these, you should go back to taking pantoprazole once daily. Return to the office in six months or call sooner if needed.

## 2022-06-05 DIAGNOSIS — M1612 Unilateral primary osteoarthritis, left hip: Secondary | ICD-10-CM | POA: Insufficient documentation

## 2022-06-10 DIAGNOSIS — M1612 Unilateral primary osteoarthritis, left hip: Secondary | ICD-10-CM | POA: Diagnosis not present

## 2022-06-10 DIAGNOSIS — M7052 Other bursitis of knee, left knee: Secondary | ICD-10-CM | POA: Diagnosis not present

## 2022-06-11 DIAGNOSIS — X32XXXD Exposure to sunlight, subsequent encounter: Secondary | ICD-10-CM | POA: Diagnosis not present

## 2022-06-11 DIAGNOSIS — L570 Actinic keratosis: Secondary | ICD-10-CM | POA: Diagnosis not present

## 2022-06-11 DIAGNOSIS — Z1283 Encounter for screening for malignant neoplasm of skin: Secondary | ICD-10-CM | POA: Diagnosis not present

## 2022-06-11 DIAGNOSIS — D225 Melanocytic nevi of trunk: Secondary | ICD-10-CM | POA: Diagnosis not present

## 2022-06-11 DIAGNOSIS — L218 Other seborrheic dermatitis: Secondary | ICD-10-CM | POA: Diagnosis not present

## 2022-06-12 ENCOUNTER — Ambulatory Visit (HOSPITAL_COMMUNITY): Payer: Medicare HMO | Attending: Orthopaedic Surgery

## 2022-06-12 ENCOUNTER — Other Ambulatory Visit: Payer: Self-pay

## 2022-06-12 DIAGNOSIS — M25561 Pain in right knee: Secondary | ICD-10-CM | POA: Insufficient documentation

## 2022-06-12 DIAGNOSIS — R2689 Other abnormalities of gait and mobility: Secondary | ICD-10-CM | POA: Diagnosis not present

## 2022-06-12 NOTE — Therapy (Addendum)
OUTPATIENT PHYSICAL THERAPY RE-EVALUATION   Patient Name: [name] MRN: [id number] DOB:Oct 19, 1941, 81 y.o., female Today's Date: 06/12/2022  PCP: Allyn Kenner MD  REFERRING PROVIDER: Rod Can, MD   END OF SESSION:   PT End of Session - 06/12/22 1124     Visit Number 1    Number of Visits 12    Date for PT Re-Evaluation 05/16/22    Authorization Type [employer]    Authorization Time Period Approved 12 5/4-6/15/23; NEW REQUEST 06/12/22 PLEASE CHECK    Progress Note Due on Visit 10    PT Start Time 1120    PT Stop Time 1200    PT Time Calculation (min) 40 min    Activity Tolerance Patient tolerated treatment well    Behavior During Therapy WFL for tasks assessed/performed                Past Medical History:  Diagnosis Date   Anemia    Anxiety    Arthritis    Asthma    Constipation    GERD (gastroesophageal reflux disease)    High blood pressure    History of COVID-19 12/28/2020   Hypothyroidism    PAD (peripheral artery disease) (HCC)    bilateral legs   Peripheral edema    Peripheral neuropathy    bilateral feet at night occ   Psoriasis    Seasonal allergies    Skin cancer    Left eye brow   Varicose veins    Vertigo 11/23/2013   Past Surgical History:  Procedure Laterality Date   APPENDECTOMY     patient unsure   BIOPSY  12/21/2020   Procedure: BIOPSY;  Surgeon: Daneil Dolin, MD;  [facility]: [facility];  Service: Endoscopy;;   BIOPSY  05/28/2021   Procedure: BIOPSY;  Surgeon: Eloise Harman, DO;  Location: [facility];  Service: Endoscopy;;   CATARACT EXTRACTION W/PHACO Left 01/27/2017   Procedure: CATARACT EXTRACTION PHACO AND INTRAOCULAR LENS PLACEMENT ([place]);  Surgeon: Tonny Branch, MD;  Location: [facility];  Service: Ophthalmology;  Laterality: Left;  CDE: 29.90   CATARACT EXTRACTION W/PHACO Right 02/24/2017   Procedure: CATARACT EXTRACTION PHACO AND INTRAOCULAR LENS PLACEMENT ([facility]);  Surgeon: Tonny Branch, MD;  Location: [facility];  Service: Ophthalmology;  Laterality: Right;  CDE: 8.57   CHOLECYSTECTOMY     COLONOSCOPY N/A 10/01/2016   Procedure: COLONOSCOPY;  Surgeon: Daneil Dolin, MD; pancolonic diverticulosis, melanosis coli, otherwise normal exam.  No recommendations to repeat colonoscopy.   COLONOSCOPY WITH PROPOFOL N/A 12/21/2020   [place]k: Diverticulosis   ESOPHAGOGASTRODUODENOSCOPY (EGD) WITH PROPOFOL N/A 12/21/2020   [place]k: Erosive reflux esophagitis, nonbleeding gastric ulcers with biopsy showing erosion/ulceration, negative for H. pylori.   ESOPHAGOGASTRODUODENOSCOPY (EGD) WITH PROPOFOL N/A 05/28/2021   [place]: gastritis (reactive gastropathy), no H.pylori   FLEXOR TENDON REPAIR Right 11/06/2017   Procedure: RIGHT WRIST FLEXOR TENDON REPAIRAND STT DEBRIEDMENT;  Surgeon: Leanora Cover, MD;  Location: [place];  Service: Orthopedics;  Laterality: Right;   MOUTH SURGERY     artery bleed   TOTAL HIP ARTHROPLASTY Right 01/31/2021   Procedure: TOTAL HIP ARTHROPLASTY ANTERIOR APPROACH;  Surgeon: Rod Can, MD;  Location: [facility];  Service: Orthopedics;  Laterality: Right;   TUBAL LIGATION     Patient Active Problem List   Diagnosis Date Noted   Gastritis and gastroduodenitis 06/14/2021   Gastric ulcer 04/13/2021   Abdominal pain 03/15/2021   Fracture of femoral neck, right ([place]) 01/31/2021  Closed displaced fracture of right femoral neck ([place]) 01/31/2021   Indigestion 11/02/2020   Change in stool caliber 11/02/2020   Loss of weight 08/23/2020   Nausea without vomiting 06/15/2020   Tick bite 04/10/2020   Medication refill 04/10/2020   Acquired trigger finger 03/28/2020   Pain in right knee 10/08/2019   Acute maxillary sinusitis 08/20/2019   Cellulitis, leg 08/10/2019   Cellulitis 08/08/2019   Fever 08/08/2019   Cellulitis of foot 08/08/2019   Hypokalemia 08/08/2019   Hyponatremia 08/08/2019   Essential hypertension 08/08/2019   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    Adverse food reaction 09/24/2018   Mild persistent asthma without complication 62/26/3335   Pain of left hip joint 07/17/2018   Dyspnea on exertion 01/24/2017   Cough variant asthma  vs UACS 01/24/2017   High risk medication use 09/25/2016   Varicose veins of lower extremities with other complications 45/62/5638   Varicose veins 02/15/2014   Vertigo 11/23/2013   Pain in limb 10/04/2013   Hip bursitis 02/11/2013   Constipation 02/16/2009   RECTAL BLEEDING 02/16/2009   HIGH BLOOD PRESSURE 01/29/2008    REFERRING DIAG: 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  Had x-ray and MRI done; all clear; Dr. Lyla Glassing referred back to physical therapy for right knee pain. Hx of injection into right knee 03/22/22 with little change.   PAIN:  Are you having pain? Yes: NPRS scale: 3/10 Pain location: right knee, left hip     OBJECTIVE:    DIAGNOSTIC FINDINGS: NA   PATIENT SURVEYS:  FOTO 50% function    COGNITION:           Overall cognitive status: Within functional limits for tasks assessed                            PALPATION: Mod TTP about medial and lateral RT knee joint lines   LE ROM:   Active ROM Right 04/04/2022 Left 04/04/2022 Right 06/12/22 Left 06/12/22  Hip flexion        Hip extension        Hip abduction        Hip adduction        Hip internal rotation        Hip external rotation        Knee flexion 125 125 126 128  Knee extension 0 0 0 0  Ankle dorsiflexion        Ankle plantarflexion        Ankle inversion        Ankle eversion         (Blank rows = not tested)   LE MMT:   MMT Right 04/04/2022 Left 04/04/2022 Right 06/12/22 Left 06/12/22  Hip flexion 4 4    Hip extension 3- 3- 3-  3-  Hip abduction '4 4 4 4  '$ Hip adduction        Hip internal rotation        Hip external rotation        Knee flexion        Knee extension '4 4 4 '$ 4+  Ankle dorsiflexion 4 4 4+ 4+  Ankle plantarflexion  Ankle inversion        Ankle eversion         (Blank rows = not tested)     FUNCTIONAL TESTS:  5 times sit to stand: 14.3 sec with no hands    GAIT: Decreased stride, antalgic, decreased hip and knee flexion bilat, no AD      TODAY'S TREATMENT: 06/12/22 Progress note FOTO 59% 5 times sit to stand 21 sec today  - Supine Bridge  - 2 x daily - 7 x weekly - 1 sets - 8 reps - Supine Quad Set  - 2 x daily - 7 x weekly - 1 sets - 10 reps - 5" hold - Active Straight Leg Raise with Quad Set  - 2 x daily - 7 x weekly - 1 sets - 10 reps     05/08/22 Nustep seat 6 level 4 with UE x 5'  Standing // bars Heel raises 2 x 10 Slant board 3 x 20" Hip vectors 2 x 5 bilaterally 4" step ups no UE support 2 x 10  4" Lateral step ups fingertip support 2 x 10 sit to stand x 10 Red ball flexion with Ue's  x 10 Sit to stand x 10 on foam with red ball flexion with Ue's x 10      05/06/22 Standing // bars Heel raises 2 x 10 Slant board 3 x 20" Side stepping on blue line 2RT Side stepping in // bars RTB around knees 4RT 6inch step up anterior and lateral x 10 each with b/l UE suppor 6" forward step up Lt, 4" Rt 10X with 1 UE support Tandem stance on foam with UE flexion using 1# bar x 1' each on foam TKE with bodycraft 3 plates x 20 each LE with 5" holds Sit to stand from chair with red med ball flexed in UE's 2 x 10 Nustep seat 6 level 4 with UE x 5'   04/30/22 Nustep seat 6 level 4 arms 6 x 5'  Standing // bars Heel raises 2 x 10 Slant board 3 x 20" 6inch step up anterior and lateral x 10 eah with b/l UE support Big hip abduction side steps in // bars 6'x4 added red theraband around knees Tandem stance x 1' each on foam TKE with bodycraft 3 plates x 20 Sit to stand from  chair with red med ball flexed in Ue's 2 x 10     04/26/22 Nustep level 4 x5 mins Resisted walkouts 3 plates x5 ant and retro 6inch step up anterior and lateral x20 eah with b/l UE support  High knee marches with contralateral hand tap to knee 6' x4 Big hip abduction side steps in // bars 6'x4 TKE on right 3plates x30 Foam beam side steps x4 laps  All at SBA up to [place]a        PATIENT EDUCATION:  Education details: added hip vectors Person educated: Patient Education method: Explanation handout Education comprehension: verbalized understanding and returned demonstration     HOME EXERCISE PROGRAM: Access Code: [email or web address] URL: [email or web address] Date: 06/12/2022 Prepared by: [facility]  Exercises - Supine Bridge  - 2 x daily - 7 x weekly - 1 sets - 8 reps - Supine Quad Set  - 2 x daily - 7 x weekly - 1 sets - 10 reps - 5" hold - Active Straight Leg Raise with Quad Set  - 2 x daily - 7 x weekly - 1 sets - 10 reps  04/19/22 Access Code:  [email or web address] URL: [email or web address] Date: 04/19/2022 Prepared by: Leota Jacobsen Exercises - Hip Extension with Resistance Loop  - 1 x daily - 7 x weekly - 3 sets - 10 reps - Standing Hip Flexion with Resistance Loop  - 1 x daily - 7 x weekly - 3 sets - 10 reps - Standing Hip Abduction Kicks  - 1 x daily - 7 x weekly - 3 sets - 10 reps  04/15/22: STS  5/4/2: Quad Set, SLR, heelslide     ASSESSMENT:   CLINICAL IMPRESSION: Progress note/ re-eval today.  Patient presents with continued lower extremity weakness and right knee pain that adversely affect her ability to navigate her home and community and care for her sick husband.   Patient will continue to benefit from physical therapy in order to improve function and reduce impairment.       OBJECTIVE IMPAIRMENTS Abnormal gait, decreased activity tolerance, decreased balance, decreased mobility, difficulty walking, decreased ROM, decreased strength,  hypomobility, increased fascial restrictions, impaired flexibility, improper body mechanics, and pain.    ACTIVITY LIMITATIONS cleaning, community activity, driving, meal prep, laundry, yard work, shopping, and yard work.    PERSONAL FACTORS Age are also affecting patient's functional outcome.      REHAB POTENTIAL: Good   CLINICAL DECISION MAKING: Stable/uncomplicated   EVALUATION COMPLEXITY: Low     GOALS: SHORT TERM GOALS: Target date: 06/26/2022   Patient will be independent with initial HEP and self-management strategies to improve functional outcomes Baseline:  Goal status: Ongoing   LONG TERM GOALS: Target date: 07/10/2022   Patient will be independent with advanced HEP and self-management strategies to improve functional outcomes Baseline:  Goal status: Ongoing   2.  Patient will improve FOTO score to predicted value to indicate improvement in functional outcomes Baseline: 50%; 06/12/22 59%   Goal status: Ongoing   3.  Patient will be able to perform stand x 5 in < 12 seconds to demonstrate improvement in functional mobility and reduced risk for falls.  Baseline: 14.3 sec; 06/12/22 21 sec Goal status: Ongoing   4. Patient will have equal to or > 4+/5 MMT throughout BLE to improve ability to perform functional mobility, stair ambulation and ADLs.  Baseline: See MMT  Goal status: Ongoing     PLAN: PT FREQUENCY: 2x/week   PT DURATION: 4 weeks   PLANNED INTERVENTIONS: Therapeutic exercises, Therapeutic activity, Neuromuscular re-education, Balance training, Gait training, Patient/Family education, Joint manipulation, Joint mobilization, Stair training, Aquatic Therapy, Dry Needling, Electrical stimulation, Spinal manipulation, Spinal mobilization, Cryotherapy, Moist heat, scar mobilization, Taping, Traction, Ultrasound, Biofeedback, Ionotophoresis '4mg'$ /ml Dexamethasone, and Manual therapy.     PLAN FOR NEXT SESSION: Glute, quad and hamstring strength.    12:14 PM,  06/12/22 Merriel Zinger Small Helane Briceno MPT [facility]  [phone]

## 2022-06-12 NOTE — Patient Outreach (Signed)
[facility]) Care Management  06/12/2022  [name] [birth year] [id number]   Telephone call to patient for disease management follow up.  Reports blood pressure up some and new medications.  Discussed hypertension management.    Care Plan : RN CM Plan of Care  Updates made by Jon Billings, RN since 06/12/2022 12:00 AM     Problem: Chronic disease management and care coordination of HTN.   Priority: High     Goal: Development of plan of care for management of falls Completed 06/12/2022  Start Date: 04/17/2022  Expected End Date: 07/01/2022  Priority: High  Note:   Current Barriers:  Knowledge Deficits related to plan of care for management of falls   RNCM Clinical Goal(s):  Patient will verbalize understanding of plan for management of Falls as evidenced by no falls within 30 days  through collaboration with RN Care manager, provider, and care team.   Interventions: Education and support related to falls Inter-disciplinary care team collaboration (see longitudinal plan of care) Evaluation of current treatment plan related to  self management and patient's adherence to plan as established by provider   Falls Interventions:  (Status:  Goal Met.) Short Term Goal Provided written and verbal education re: potential causes of falls and Fall prevention strategies Advised patient of importance of notifying provider of falls Assessed for falls since last encounter Falls Prevention Discussed Keeping moving and stay active Keep your bones strong Go for regular eye checkups Always stand up slowly Wear proper non-slip footwear Light up your living space Install assistive devices.   04/17/22 Patient with recent fall out of bed.  Fall prevention discussed.  No concerns.   06/12/22 Patient has had no recent falls.   Patient Goals/Self-Care Activities: Falls Call provider office for new concerns or questions  Keeping moving and stay active Always stand up  slowly Wear proper non-slip footwear Light up your living space Install assistive devices.    Follow Up Plan:  goal met    Long-Range Goal: Development of plan of care for management of HTN   Start Date: 02/19/2022  Expected End Date: 12/01/2022  This Visit's Progress: On track  Recent Progress: On track  Priority: High  Note:   Current Barriers:  Chronic Disease Management support and education needs related to HTN   RNCM Clinical Goal(s):  Patient will verbalize basic understanding of  HTN disease process and self health management plan as evidenced by Blood pressure less than 140/80 continue to work with RN Care Manager to address care management and care coordination needs related to  HTN as evidenced by adherence to CM Team Scheduled appointments through collaboration with RN Care manager, provider, and care team.   Interventions: Education and support related to HTN Inter-disciplinary care team collaboration (see longitudinal plan of care) Evaluation of current treatment plan related to  self management and patient's adherence to plan as established by provider   Hypertension Interventions:  (Status:  Goal on track:  Yes.) Long Term Goal Last practice recorded BP readings:  BP Readings from Last 3 Encounters:  01/20/22 (!) 146/82  09/26/21 126/80  07/31/21 (!) 147/93  Most recent eGFR/CrCl: No results found for: EGFR  No components found for: CRCL  Evaluation of current treatment plan related to hypertension self management and patient's adherence to plan as established by provider Reviewed medications with patient and discussed importance of compliance Discussed plans with patient for ongoing care management follow up and provided patient with direct contact information for  care management team  03/20/22 Patient reports blood pressure better but still fluctuates.  She sees the physician on tomorrow for follow up.  Last report BP 139/86. Discussed hypertension management  and importance to reduce risk for heart attack and stroke.    04/17/22 Patient blood pressure better.  Last blood pressure 127/83.  Discussed hypertension management.  No concerns.    06/12/22 Patient reports blood pressure up.  Recent medication changes. Blood pressure 185-165/90's.  Discussed blood pressure control.    Patient Goals/Self-Care Activities: Hypertension Take all medications as prescribed Attend all scheduled provider appointments check blood pressure daily keep a blood pressure log take blood pressure log to all doctor appointments call doctor for signs and symptoms of high blood pressure Limit salt intake  Follow Up Plan:  Telephone follow up appointment with care management team member scheduled for:  August The patient has been provided with contact information for the care management team and has been advised to call with any health related questions or concerns.      Plan: Follow-up: Patient agrees to Care Plan and Follow-up. Follow-up in August.  Dathan Attia J Desjuan Stearns, RN, MSN [facility] Care Management Coordinator Direct Line [phone] Toll Free: [phone]  Fax: [phone]

## 2022-06-12 NOTE — Patient Instructions (Signed)
Patient Goals/Self-Care Activities: Hypertension Take all medications as prescribed Attend all scheduled provider appointments check blood pressure daily keep a blood pressure log take blood pressure log to all doctor appointments call doctor for signs and symptoms of high blood pressure Limit salt intake

## 2022-06-13 NOTE — Addendum Note (Signed)
Addended byKarn Cassis S on: 06/13/2022 01:45 PM   Modules accepted: Orders

## 2022-06-16 ENCOUNTER — Other Ambulatory Visit: Payer: Self-pay | Admitting: Gastroenterology

## 2022-06-24 DIAGNOSIS — Z6827 Body mass index (BMI) 27.0-27.9, adult: Secondary | ICD-10-CM | POA: Diagnosis not present

## 2022-06-24 DIAGNOSIS — M25552 Pain in left hip: Secondary | ICD-10-CM | POA: Diagnosis not present

## 2022-06-24 DIAGNOSIS — F411 Generalized anxiety disorder: Secondary | ICD-10-CM | POA: Diagnosis not present

## 2022-06-24 DIAGNOSIS — R600 Localized edema: Secondary | ICD-10-CM | POA: Diagnosis not present

## 2022-06-24 DIAGNOSIS — E663 Overweight: Secondary | ICD-10-CM | POA: Diagnosis not present

## 2022-06-24 DIAGNOSIS — M1711 Unilateral primary osteoarthritis, right knee: Secondary | ICD-10-CM | POA: Diagnosis not present

## 2022-06-24 DIAGNOSIS — I10 Essential (primary) hypertension: Secondary | ICD-10-CM | POA: Diagnosis not present

## 2022-06-25 ENCOUNTER — Encounter (HOSPITAL_COMMUNITY): Payer: Self-pay | Admitting: Emergency Medicine

## 2022-06-25 ENCOUNTER — Other Ambulatory Visit: Payer: Self-pay

## 2022-06-25 ENCOUNTER — Emergency Department (HOSPITAL_COMMUNITY): Payer: Medicare HMO

## 2022-06-25 ENCOUNTER — Emergency Department (HOSPITAL_COMMUNITY)
Admission: EM | Admit: 2022-06-25 | Discharge: 2022-06-25 | Disposition: A | Discharge status: Home / Self Care | Payer: Medicare HMO | Attending: Emergency Medicine | Admitting: Emergency Medicine

## 2022-06-25 DIAGNOSIS — R0789 Other chest pain: Secondary | ICD-10-CM | POA: Diagnosis not present

## 2022-06-25 DIAGNOSIS — I10 Essential (primary) hypertension: Secondary | ICD-10-CM | POA: Insufficient documentation

## 2022-06-25 DIAGNOSIS — J45909 Unspecified asthma, uncomplicated: Secondary | ICD-10-CM | POA: Insufficient documentation

## 2022-06-25 DIAGNOSIS — R0781 Pleurodynia: Secondary | ICD-10-CM | POA: Diagnosis not present

## 2022-06-25 DIAGNOSIS — Z7982 Long term (current) use of aspirin: Secondary | ICD-10-CM | POA: Insufficient documentation

## 2022-06-25 DIAGNOSIS — Z79899 Other long term (current) drug therapy: Secondary | ICD-10-CM | POA: Diagnosis not present

## 2022-06-25 MED ORDER — ACETAMINOPHEN 500 MG PO TABS
1000.0000 mg | ORAL_TABLET | Freq: Once | ORAL | Status: AC
  Administered 2022-06-25: 1000 mg via ORAL
  Filled 2022-06-25: qty 2

## 2022-06-25 NOTE — ED Notes (Signed)
Pt ambulated to the restroom X 1 assist.

## 2022-06-25 NOTE — ED Provider Notes (Signed)
[facility] EMERGENCY DEPARTMENT Provider Note   CSN: [id number] Arrival date & time: 06/25/22  1407     History  Chief Complaint  Patient presents with   Chest Pain    Carigan Lister Pandolfi is a 81 y.o. female with a history including hypertension, asthma, peripheral arterial disease, GERD presenting for evaluation of left-sided chest pain which started yesterday morning.  She describes sharp stabs of pain which is triggered by sitting up from a supine position.  The pain is located underneath her left breast.  It is resolved at rest and triggered with these positional changes.  It is not reproducible with palpation, she states she has been holding her hand over the site which equivocally improves the symptoms.  She denies any injuries or falls recently.  She does have a husband with Alzheimer's who has daily home health care but she does occasionally help to lift him, but denies any specific pain occurred from this activity.  She denies shortness of breath, cough, palpitations, fevers or chills.  The history is provided by the patient.       Home Medications Prior to Admission medications   Medication Sig Start Date End Date Taking? Authorizing Provider  acetaminophen (TYLENOL) 500 MG tablet Take 500-1,000 mg by mouth as needed for mild pain or moderate pain.    [provider]  albuterol (VENTOLIN HFA) 108 (90 Base) MCG/ACT inhaler Inhale 2 puffs into the lungs every 6 (six) hours as needed for wheezing or shortness of breath.    [provider]  ALPRAZolam (XANAX) 0.5 MG tablet Take 0.5 mg by mouth in the morning, at noon, and at bedtime. (1000, 1500, AT BEDTIME)    [provider]  amLODipine (NORVASC) 5 MG tablet Take 5 mg by mouth 2 (two) times daily. 02/09/13   [provider]  aspirin EC 81 MG tablet Take 81 mg by mouth daily. Swallow whole.    [provider]  azelastine (ASTELIN) 0.1 % nasal spray Place into both nostrils. 01/26/22    [provider]  Cholecalciferol (VITAMIN D3) 50 MCG (2000 UT) capsule Take 2,000 Units by mouth daily.    [provider]  diclofenac Sodium (VOLTAREN) 1 % GEL Apply 1 application topically as needed (back pain).    [provider]  levothyroxine (SYNTHROID) 50 MCG tablet Take 1 tablet (50 mcg total) by mouth daily before breakfast. 03/26/22   Brita Romp, NP  losartan (COZAAR) 50 MG tablet Take 50 mg by mouth 2 (two) times daily. 07/14/20   [provider]  Multiple Vitamin (MULTIVITAMIN WITH MINERALS) TABS tablet Take 1 tablet by mouth daily in the afternoon.    [provider]  mupirocin ointment (BACTROBAN) 2 % APPLY TO AFFECTED AREA DAILY 03/07/22   Trula Slade, DPM  pantoprazole (PROTONIX) 40 MG tablet TAKE 1 TABLET BY MOUTH ONCE A DAY. 06/19/22   Mahala Menghini, PA-C  polyethylene glycol (MIRALAX / GLYCOLAX) 17 g packet Take 17 g by mouth daily.    [provider]  raloxifene (EVISTA) 60 MG tablet Take 1 tablet (60 mg total) by mouth every morning. Patient taking differently: Take 60 mg by mouth every other day. IN THE MORNING 04/10/20   Estill Dooms, NP  SYMBICORT 80-4.5 MCG/ACT inhaler INHALE 2 PUFFS INTO THE LUNGS TWICE DAILY. Patient taking differently: Inhale 2 puffs into the lungs daily. 02/02/18   Tanda Rockers, MD  venlafaxine XR (EFFEXOR-XR) 75 MG 24 hr capsule venlafaxine  ER 75 mg capsule,extended release 24 hr    [provider]      Allergies    Amoxicillin-pot clavulanate, Ace inhibitors, Cetirizine, Entex la, Gabapentin, Levofloxacin, and Meloxicam    Review of Systems   Review of Systems  Constitutional:  Negative for chills and fever.  HENT:  Negative for congestion and sore throat.   Eyes: Negative.   Respiratory:  Negative for chest tightness and shortness of breath.   Cardiovascular:  Positive for chest pain. Negative for palpitations and leg swelling.  Gastrointestinal:  Negative for  abdominal pain, nausea and vomiting.  Genitourinary: Negative.   Musculoskeletal:  Negative for arthralgias, joint swelling and neck pain.  Skin: Negative.  Negative for rash and wound.  Neurological:  Negative for dizziness, weakness, light-headedness, numbness and headaches.  Psychiatric/Behavioral: Negative.      Physical Exam Updated Vital Signs BP (!) 148/90 (BP Location: Left Arm)   Pulse 86   Temp (!) 97.5 F (36.4 C) (Oral)   Resp 18   Ht '5\' 3"'$  (1.6 m)   Wt 66.2 kg   SpO2 100%   BMI 25.86 kg/m  Physical Exam Vitals and nursing note reviewed.  Constitutional:      Appearance: She is well-developed.  HENT:     Head: Normocephalic and atraumatic.  Eyes:     Conjunctiva/sclera: Conjunctivae normal.  Cardiovascular:     Rate and Rhythm: Normal rate and regular rhythm.     Heart sounds: Normal heart sounds.  Pulmonary:     Effort: Pulmonary effort is normal.     Breath sounds: Normal breath sounds. No decreased breath sounds, wheezing, rhonchi or rales.  Chest:     Chest wall: No deformity or crepitus.  Abdominal:     General: Bowel sounds are normal.     Palpations: Abdomen is soft.     Tenderness: There is no abdominal tenderness.  Musculoskeletal:        General: Normal range of motion.     Cervical back: Normal range of motion.     Right lower leg: No tenderness. No edema.     Left lower leg: No tenderness. No edema.  Skin:    General: Skin is warm and dry.  Neurological:     General: No focal deficit present.     Mental Status: She is alert.     ED Results / Procedures / Treatments   Labs (all labs ordered are listed, but only abnormal results are displayed) Labs Reviewed - No data to display  EKG EKG Interpretation  Date/Time:  Tuesday June 25 2022 14:38:12 EDT Ventricular Rate:  83 PR Interval:  199 QRS Duration: 94 QT Interval:  417 QTC Calculation: 490 R Axis:   -59 Text Interpretation: Sinus arrhythmia Left anterior fascicular block Low  voltage, precordial leads Borderline prolonged QT interval Baseline wander in lead(s) V5 When compared with ECG of 03/15/2021, QT has lengthened Confirmed by Delora Fuel ([id number]) on 06/25/2022 11:35:25 PM  Radiology DG Ribs Unilateral W/Chest Left  Result Date: 06/25/2022 CLINICAL DATA:  Anterior left rib pain beginning yesterday. No injury. EXAM: LEFT RIBS AND CHEST - 3+ VIEW COMPARISON:  Chest x-ray 09/11/2020 FINDINGS: Lungs are adequately inflated and otherwise clear. Cardiomediastinal silhouette is normal. No acute left rib fracture. Degenerative change of the spine. IMPRESSION: No acute findings. Electronically Signed   By: Marin Olp M.D.   On: 06/25/2022 15:15    Procedures Procedures    Medications Ordered in ED Medications  acetaminophen (TYLENOL)  tablet 1,000 mg (1,000 mg Oral Given 06/25/22 1724)    ED Course/ Medical Decision Making/ A&P                           Medical Decision Making Pt with positional chest pain, only triggered when sitting up from a supine position, localized beneath left breast.  Non tender on exam, she has no shortness of breath, no pleuritic symptoms, not reproducible upon palp patient but only with positional changes per above.  Her history and exam strongly suggest a chest wall component, probably a costochondritis or muscle strain.  Her vital signs are stable, normal respiratory rate, pulse ox 100% on room air, doubt pulmonary embolism.  Also ACS is highly unlikely given the positional nature of her symptoms.  We discussed home treatment, suggesting Lidoderm patch at the site, increased doses of Tylenol.  Patient is not a good candidate for NSAIDs.  Advise close follow-up with her primary provider if this treatment plan does not improve her symptoms.  Patient was discussed with Dr. Rogene Houston prior to discharge home  Amount and/or Complexity of Data Reviewed Radiology: ordered.  Risk OTC drugs.           Final Clinical Impression(s) / ED  Diagnoses Final diagnoses:  Chest wall pain    Rx / DC Orders ED Discharge Orders     None         Landis Martins 06/25/22 2338    Fredia Sorrow, MD 07/02/22 [phone]

## 2022-06-25 NOTE — ED Notes (Signed)
Patient transported to X-ray 

## 2022-06-25 NOTE — Discharge Instructions (Signed)
Your history and exam findings suggest that you have strained muscles or ribs in your left rib cage.  I recommend taking Tylenol 1000 mg every 6 hours as needed for pain relief.  You may also try using your lidocaine patch at the site of maximum pain as well.  Get rechecked for any persistent or worsening symptoms as discussed.

## 2022-06-25 NOTE — ED Triage Notes (Signed)
Pt presents with left ribcage pain that started yesterday, pain went away and has returned today, pt sometime help lift husband with has dementia.

## 2022-06-26 ENCOUNTER — Other Ambulatory Visit: Payer: Self-pay | Admitting: Podiatry

## 2022-07-01 DIAGNOSIS — E039 Hypothyroidism, unspecified: Secondary | ICD-10-CM | POA: Diagnosis not present

## 2022-07-01 DIAGNOSIS — E119 Type 2 diabetes mellitus without complications: Secondary | ICD-10-CM | POA: Diagnosis not present

## 2022-07-01 DIAGNOSIS — I10 Essential (primary) hypertension: Secondary | ICD-10-CM | POA: Diagnosis not present

## 2022-07-01 DIAGNOSIS — E782 Mixed hyperlipidemia: Secondary | ICD-10-CM | POA: Diagnosis not present

## 2022-07-03 ENCOUNTER — Ambulatory Visit (HOSPITAL_COMMUNITY): Payer: Medicare HMO | Attending: Orthopaedic Surgery | Admitting: Physical Therapy

## 2022-07-03 ENCOUNTER — Encounter (HOSPITAL_COMMUNITY): Payer: Self-pay | Admitting: Physical Therapy

## 2022-07-03 DIAGNOSIS — R2689 Other abnormalities of gait and mobility: Secondary | ICD-10-CM | POA: Insufficient documentation

## 2022-07-03 DIAGNOSIS — M25561 Pain in right knee: Secondary | ICD-10-CM | POA: Insufficient documentation

## 2022-07-03 NOTE — Therapy (Signed)
OUTPATIENT PHYSICAL THERAPY TREATMENT   Patient Name: [name] MRN: [id number] DOB:05/24/1941, 81 y.o., female Today's Date: 07/03/2022  PCP: Allyn Kenner MD  REFERRING PROVIDER: Rod Can, MD   END OF SESSION:   PT End of Session - 07/03/22 1029     Visit Number 11    Number of Visits 20    Date for PT Re-Evaluation 07/10/22    Authorization Type [employer]    Authorization Time Period Approved 12 5/4-6/15/23; 8 visits approved 06/12/22-07/10/22    Progress Note Due on Visit 10    PT Start Time 1031    PT Stop Time 1110    PT Time Calculation (min) 39 min    Activity Tolerance Patient tolerated treatment well    Behavior During Therapy [facility] for tasks assessed/performed                Past Medical History:  Diagnosis Date   Anemia    Anxiety    Arthritis    Asthma    Constipation    GERD (gastroesophageal reflux disease)    High blood pressure    History of COVID-19 12/28/2020   Hypothyroidism    PAD (peripheral artery disease) (HCC)    bilateral legs   Peripheral edema    Peripheral neuropathy    bilateral feet at night occ   Psoriasis    Seasonal allergies    Skin cancer    Left eye brow   Varicose veins    Vertigo 11/23/2013   Past Surgical History:  Procedure Laterality Date   APPENDECTOMY     patient unsure   BIOPSY  12/21/2020   Procedure: BIOPSY;  Surgeon: Daneil Dolin, MD;  [facility];  Service: Endoscopy;;   BIOPSY  05/28/2021   Procedure: BIOPSY;  Surgeon: Eloise Harman, DO;  [facility];  Service: Endoscopy;;   CATARACT EXTRACTION W/PHACO Left 01/27/2017   Procedure: CATARACT EXTRACTION PHACO AND INTRAOCULAR LENS PLACEMENT ([place]);  Surgeon: Tonny Branch, MD;  Location: [facility];  Service: Ophthalmology;  Laterality: Left;  CDE: 29.90   CATARACT EXTRACTION W/PHACO Right 02/24/2017   Procedure: CATARACT EXTRACTION PHACO AND INTRAOCULAR LENS PLACEMENT ([facility]);  Surgeon: Tonny Branch, MD;  Location: [facility];   Service: Ophthalmology;  Laterality: Right;  CDE: 8.57   CHOLECYSTECTOMY     COLONOSCOPY N/A 10/01/2016   Procedure: COLONOSCOPY;  Surgeon: Daneil Dolin, MD; pancolonic diverticulosis, melanosis coli, otherwise normal exam.  No recommendations to repeat colonoscopy.   COLONOSCOPY WITH PROPOFOL N/A 12/21/2020   Rourk: Diverticulosis   ESOPHAGOGASTRODUODENOSCOPY (EGD) WITH PROPOFOL N/A 12/21/2020   Rourk: Erosive reflux esophagitis, nonbleeding gastric ulcers with biopsy showing erosion/ulceration, negative for H. pylori.   ESOPHAGOGASTRODUODENOSCOPY (EGD) WITH PROPOFOL N/A 05/28/2021   Carver: gastritis (reactive gastropathy), no H.pylori   FLEXOR TENDON REPAIR Right 11/06/2017   Procedure: RIGHT WRIST FLEXOR TENDON REPAIRAND STT DEBRIEDMENT;  Surgeon: Leanora Cover, MD;  Location: [facility];  Service: Orthopedics;  Laterality: Right;   MOUTH SURGERY     artery bleed   TOTAL HIP ARTHROPLASTY Right 01/31/2021   Procedure: TOTAL HIP ARTHROPLASTY ANTERIOR APPROACH;  Surgeon: Rod Can, MD;  Location: [facility];  Service: Orthopedics;  Laterality: Right;   TUBAL LIGATION     Patient Active Problem List   Diagnosis Date Noted   Gastritis and gastroduodenitis 06/14/2021   Gastric ulcer 04/13/2021   Abdominal pain 03/15/2021   Fracture of femoral neck, right ([place]) 01/31/2021   Closed  displaced fracture of right femoral neck ([place]) 01/31/2021   Indigestion 11/02/2020   Change in stool caliber 11/02/2020   Loss of weight 08/23/2020   Nausea without vomiting 06/15/2020   Tick bite 04/10/2020   Medication refill 04/10/2020   Acquired trigger finger 03/28/2020   Pain in right knee 10/08/2019   Acute maxillary sinusitis 08/20/2019   Cellulitis, leg 08/10/2019   Cellulitis 08/08/2019   Fever 08/08/2019   Cellulitis of foot 08/08/2019   Hypokalemia 08/08/2019   Hyponatremia 08/08/2019   Essential hypertension 08/08/2019   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   Adverse  food reaction 09/24/2018   Mild persistent asthma without complication 95/18/8416   Pain of left hip joint 07/17/2018   Dyspnea on exertion 01/24/2017   Cough variant asthma  vs UACS 01/24/2017   High risk medication use 09/25/2016   Varicose veins of lower extremities with other complications 60/63/0160   Varicose veins 02/15/2014   Vertigo 11/23/2013   Pain in limb 10/04/2013   Hip bursitis 02/11/2013   Constipation 02/16/2009   RECTAL BLEEDING 02/16/2009   HIGH BLOOD PRESSURE 01/29/2008    REFERRING DIAG: 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 Pulled a muscle in her left side with moving fridge.   PAIN:  Are you having pain? Yes: NPRS scale: 2/10 Pain location: right knee, left hip     OBJECTIVE:    DIAGNOSTIC FINDINGS: NA   PATIENT SURVEYS:  FOTO 50% function    COGNITION:           Overall cognitive status: Within functional limits for tasks assessed                            PALPATION: Mod TTP about medial and lateral RT knee joint lines   LE ROM:   Active ROM Right 04/04/2022 Left 04/04/2022 Right 06/12/22 Left 06/12/22  Hip flexion        Hip extension        Hip abduction        Hip adduction        Hip internal rotation        Hip external rotation        Knee flexion 125 125 126 128  Knee extension 0 0 0 0  Ankle dorsiflexion        Ankle plantarflexion        Ankle inversion        Ankle eversion         (Blank rows = not tested)   LE MMT:   MMT Right 04/04/2022 Left 04/04/2022 Right 06/12/22 Left 06/12/22  Hip flexion 4 4    Hip extension 3- 3- 3- 3-  Hip abduction '4 4 4 4  '$ Hip adduction        Hip internal rotation        Hip external rotation        Knee flexion        Knee extension '4 4 4 '$ 4+  Ankle  dorsiflexion 4 4 4+ 4+  Ankle plantarflexion        Ankle inversion        Ankle eversion         (Blank rows = not tested)  FUNCTIONAL TESTS:  5 times sit to stand: 14.3 sec with no hands    GAIT: Decreased stride, antalgic, decreased hip and knee flexion bilat, no AD      TODAY'S TREATMENT: 07/03/22 Nustep seat 7 level 4 x 5 minutes HR x 20  Step up 4 inch 2x 10  Lateral step up 4 inch 2 x 10  Standing hip abduction with GTB at knees 2x 10  STS 1x 10    06/12/22 Progress note FOTO 59% 5 times sit to stand 21 sec today  - Supine Bridge  - 2 x daily - 7 x weekly - 1 sets - 8 reps - Supine Quad Set  - 2 x daily - 7 x weekly - 1 sets - 10 reps - 5" hold - Active Straight Leg Raise with Quad Set  - 2 x daily - 7 x weekly - 1 sets - 10 reps   05/08/22 Nustep seat 6 level 4 with UE x 5'  Standing // bars Heel raises 2 x 10 Slant board 3 x 20" Hip vectors 2 x 5 bilaterally 4" step ups no UE support 2 x 10  4" Lateral step ups fingertip support 2 x 10 sit to stand x 10 Red ball flexion with Ue's  x 10 Sit to stand x 10 on foam with red ball flexion with Ue's x 10   05/06/22 Standing // bars Heel raises 2 x 10 Slant board 3 x 20" Side stepping on blue line 2RT Side stepping in // bars RTB around knees 4RT 6inch step up anterior and lateral x 10 each with b/l UE suppor 6" forward step up Lt, 4" Rt 10X with 1 UE support Tandem stance on foam with UE flexion using 1# bar x 1' each on foam TKE with bodycraft 3 plates x 20 each LE with 5" holds Sit to stand from chair with red med ball flexed in UE's 2 x 10 Nustep seat 6 level 4 with UE x 5'      PATIENT EDUCATION:  Education details: added hip vectors 8/2 HEP, LE alignment with exercise Person educated: Patient Education method: Explanation handout Education comprehension: verbalized understanding and returned demonstration     HOME EXERCISE PROGRAM: Access Code: [email or web address]  07/03/22- Standing Hip Abduction with  Resistance at Ankles and Counter Support  - 1 x daily - 7 x weekly - 3 sets - 10 reps - Sit to Stand with Arms Crossed  - 1 x daily - 7 x weekly - 2 sets - 10 reps   URL: [email or web address] Date: 06/12/2022 Prepared by: [facility]  Exercises - Supine Bridge  - 2 x daily - 7 x weekly - 1 sets - 8 reps - Supine Quad Set  - 2 x daily - 7 x weekly - 1 sets - 10 reps - 5" hold - Active Straight Leg Raise with Quad Set  - 2 x daily - 7 x weekly - 1 sets - 10 reps  04/19/22 Access Code: [email or web address] URL: [email or web address] Date: 04/19/2022 Prepared by: Leota Jacobsen Exercises - Hip Extension with Resistance Loop  - 1 x daily - 7 x weekly - 3 sets - 10 reps - Standing Hip Flexion with Resistance Loop  - 1 x daily - 7 x weekly - 3 sets - 10 reps - Standing Hip Abduction Kicks  - 1 x daily - 7 x weekly - 3 sets - 10 reps  04/15/22: STS  5/4/2: Quad Set, SLR, heelslide  ASSESSMENT:   CLINICAL IMPRESSION: Began session on nustep for warm up and conditioning. Patient given cueing to reduce RLE valgus due to c/o symptoms on nustep which improves symptoms when maintaining proper alignment. Continued with LE functional strengthening which is tolerated well. Patient will continue to benefit from physical therapy in order to improve function and reduce impairment.        OBJECTIVE IMPAIRMENTS Abnormal gait, decreased activity tolerance, decreased balance, decreased mobility, difficulty walking, decreased ROM, decreased strength, hypomobility, increased fascial restrictions, impaired flexibility, improper body mechanics, and pain.    ACTIVITY LIMITATIONS cleaning, community activity, driving, meal prep, laundry, yard work, shopping, and yard work.    PERSONAL FACTORS Age are also affecting patient's functional outcome.      REHAB POTENTIAL: Good   CLINICAL DECISION MAKING: Stable/uncomplicated   EVALUATION COMPLEXITY: Low     GOALS: SHORT TERM GOALS:  Target date: 06/26/2022   Patient will be independent with initial HEP and self-management strategies to improve functional outcomes Baseline:  Goal status: Ongoing   LONG TERM GOALS: Target date: 07/10/2022   Patient will be independent with advanced HEP and self-management strategies to improve functional outcomes Baseline:  Goal status: Ongoing   2.  Patient will improve FOTO score to predicted value to indicate improvement in functional outcomes Baseline: 50%; 06/12/22 59%   Goal status: Ongoing   3.  Patient will be able to perform stand x 5 in < 12 seconds to demonstrate improvement in functional mobility and reduced risk for falls.  Baseline: 14.3 sec; 06/12/22 21 sec Goal status: Ongoing   4. Patient will have equal to or > 4+/5 MMT throughout BLE to improve ability to perform functional mobility, stair ambulation and ADLs.  Baseline: See MMT  Goal status: Ongoing     PLAN: PT FREQUENCY: 2x/week   PT DURATION: 4 weeks   PLANNED INTERVENTIONS: Therapeutic exercises, Therapeutic activity, Neuromuscular re-education, Balance training, Gait training, Patient/Family education, Joint manipulation, Joint mobilization, Stair training, Aquatic Therapy, Dry Needling, Electrical stimulation, Spinal manipulation, Spinal mobilization, Cryotherapy, Moist heat, scar mobilization, Taping, Traction, Ultrasound, Biofeedback, Ionotophoresis '4mg'$ /ml Dexamethasone, and Manual therapy.     PLAN FOR NEXT SESSION: Glute, quad and hamstring strength.   10:31 AM, 07/03/22 Mearl Latin PT, DPT Physical Therapist at [facility]

## 2022-07-04 ENCOUNTER — Ambulatory Visit (HOSPITAL_COMMUNITY): Payer: Medicare HMO | Admitting: Physical Therapy

## 2022-07-04 DIAGNOSIS — R2689 Other abnormalities of gait and mobility: Secondary | ICD-10-CM | POA: Diagnosis not present

## 2022-07-04 DIAGNOSIS — M25561 Pain in right knee: Secondary | ICD-10-CM

## 2022-07-04 NOTE — Therapy (Addendum)
OUTPATIENT PHYSICAL THERAPY TREATMENT   Patient Name: [name] MRN: [id number] DOB:1941/10/11, 81 y.o., female Today's Date: 07/04/2022  PCP: Allyn Kenner MD  REFERRING PROVIDER: Rod Can, MD   PHYSICAL THERAPY DISCHARGE SUMMARY  Visits from Start of Care: 12  Current functional level related to goals / functional outcomes: See below   Remaining deficits: See below   Education / Equipment: See below   Patient agrees to discharge. Patient goals were met. Patient is being discharged due to meeting the stated rehab goals.     END OF SESSION:   PT End of Session - 07/04/22 1126     Visit Number 12    Number of Visits 20    Date for PT Re-Evaluation 07/10/22    Authorization Type [employer]    Authorization Time Period Approved 12 5/4-6/15/23; 8 visits approved 06/12/22-07/10/22    Authorization - Visit Number 3    Authorization - Number of Visits 8    Progress Note Due on Visit 20    PT Start Time 1110    PT Stop Time 1150    PT Time Calculation (min) 40 min    Activity Tolerance Patient tolerated treatment well    Behavior During Therapy WFL for tasks assessed/performed                 Past Medical History:  Diagnosis Date   Anemia    Anxiety    Arthritis    Asthma    Constipation    GERD (gastroesophageal reflux disease)    High blood pressure    History of COVID-19 12/28/2020   Hypothyroidism    PAD (peripheral artery disease) (HCC)    bilateral legs   Peripheral edema    Peripheral neuropathy    bilateral feet at night occ   Psoriasis    Seasonal allergies    Skin cancer    Left eye brow   Varicose veins    Vertigo 11/23/2013   Past Surgical History:  Procedure Laterality Date   APPENDECTOMY     patient unsure   BIOPSY  12/21/2020   Procedure: BIOPSY;  Surgeon: Daneil Dolin, MD;  [facility];  Service: Endoscopy;;   BIOPSY  05/28/2021   Procedure: BIOPSY;  Surgeon: Eloise Harman, DO;  Location: [facility];  Service: Endoscopy;;   CATARACT EXTRACTION W/PHACO Left 01/27/2017   Procedure: CATARACT EXTRACTION PHACO AND INTRAOCULAR LENS PLACEMENT ([place]);  Surgeon: Tonny Branch, MD;  Location: [facility];  Service: Ophthalmology;  Laterality: Left;  CDE: 29.90   CATARACT EXTRACTION W/PHACO Right 02/24/2017   Procedure: CATARACT EXTRACTION PHACO AND INTRAOCULAR LENS PLACEMENT ([facility]);  Surgeon: Tonny Branch, MD;  Location: [facility];  Service: Ophthalmology;  Laterality: Right;  CDE: 8.57   CHOLECYSTECTOMY     COLONOSCOPY N/A 10/01/2016   Procedure: COLONOSCOPY;  Surgeon: Daneil Dolin, MD; pancolonic diverticulosis, melanosis coli, otherwise normal exam.  No recommendations to repeat colonoscopy.   COLONOSCOPY WITH PROPOFOL N/A 12/21/2020   Rourk: Diverticulosis   ESOPHAGOGASTRODUODENOSCOPY (EGD) WITH PROPOFOL N/A 12/21/2020   Rourk: Erosive reflux esophagitis, nonbleeding gastric ulcers with biopsy showing erosion/ulceration, negative for H. pylori.   ESOPHAGOGASTRODUODENOSCOPY (EGD) WITH PROPOFOL N/A 05/28/2021   Carver: gastritis (reactive gastropathy), no H.pylori   FLEXOR TENDON REPAIR Right 11/06/2017   Procedure: RIGHT WRIST FLEXOR TENDON REPAIRAND STT DEBRIEDMENT;  Surgeon: Leanora Cover, MD;  Location: [facility];  Service: Orthopedics;  Laterality: Right;   MOUTH SURGERY  artery bleed   TOTAL HIP ARTHROPLASTY Right 01/31/2021   Procedure: TOTAL HIP ARTHROPLASTY ANTERIOR APPROACH;  Surgeon: Rod Can, MD;  Location: [facility];  Service: Orthopedics;  Laterality: Right;   TUBAL LIGATION     Patient Active Problem List   Diagnosis Date Noted   Gastritis and gastroduodenitis 06/14/2021   Gastric ulcer 04/13/2021   Abdominal pain 03/15/2021   Fracture of femoral neck, right ([place]) 01/31/2021   Closed displaced fracture of right femoral neck ([place]) 01/31/2021   Indigestion 11/02/2020   Change in stool caliber 11/02/2020   Loss of weight 08/23/2020   Nausea without  vomiting 06/15/2020   Tick bite 04/10/2020   Medication refill 04/10/2020   Acquired trigger finger 03/28/2020   Pain in right knee 10/08/2019   Acute maxillary sinusitis 08/20/2019   Cellulitis, leg 08/10/2019   Cellulitis 08/08/2019   Fever 08/08/2019   Cellulitis of foot 08/08/2019   Hypokalemia 08/08/2019   Hyponatremia 08/08/2019   Essential hypertension 08/08/2019   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   Adverse food reaction 09/24/2018   Mild persistent asthma without complication 09/28/2535   Pain of left hip joint 07/17/2018   Dyspnea on exertion 01/24/2017   Cough variant asthma  vs UACS 01/24/2017   High risk medication use 09/25/2016   Varicose veins of lower extremities with other complications 64/40/3474   Varicose veins 02/15/2014   Vertigo 11/23/2013   Pain in limb 10/04/2013   Hip bursitis 02/11/2013   Constipation 02/16/2009   RECTAL BLEEDING 02/16/2009   HIGH BLOOD PRESSURE 01/29/2008    REFERRING DIAG: M17.11: Unilateral primary osteoarthritis   THERAPY DIAG:  No diagnosis found.  PERTINENT HISTORY: Rt THA (01/2021)  PRECAUTIONS: None   SUBJECTIVE:  Pt reports she has been doing HEP and notes improvement with exercises. States she is no longer sore from her pulled muscle.  Pt request to be discharged today due to scheduling and ability to continue on her own.   PAIN:  Are you having pain? Yes: NPRS scale: 2/10 Pain location: right knee, left hip     OBJECTIVE:    DIAGNOSTIC FINDINGS: NA   PATIENT SURVEYS:  FOTO:  07/04/22: 68.6% functional status (was 50% functional status at evaluation)    COGNITION:           Overall cognitive status: Within functional limits for tasks assessed                            PALPATION: Mod TTP about medial and lateral RT knee joint lines   LE ROM:   Active ROM Right 04/04/2022 Left 04/04/2022 Right 06/12/22 Left 06/12/22  Hip flexion        Hip extension        Hip abduction        Hip adduction         Hip internal rotation        Hip external rotation        Knee flexion 125 125 126 128  Knee extension 0 0 0 0  Ankle dorsiflexion        Ankle plantarflexion        Ankle inversion        Ankle eversion         (Blank rows = not tested)   LE MMT:   MMT Right 04/04/2022 Left 04/04/2022 Right 06/12/22 Right 07/04/22 Left 06/12/22 Left  07/04/22  Hip flexion 4 4  4+  4+  Hip extension 3- 3- 3- 4+ 3- 4+  Hip abduction _0 4+ 4 4+  Hip adduction          Hip internal rotation          Hip external rotation          Knee flexion          Knee extension _1 4+ 5  Ankle dorsiflexion 4 4 4+ 5 4+ 5  Ankle plantarflexion          Ankle inversion          Ankle eversion           (Blank rows = not tested)     FUNCTIONAL TESTS:     5 times sit to stand: 07/04/22: 10.7 seconds (was 14.3 sec at evaluation) with no hands    GAIT: Decreased stride, antalgic, decreased hip and knee flexion bilat, no AD      TODAY'S TREATMENT: 07/04/22 Nustep seate 7 level 4 5 minutes MMT HEP review Explanation of Bursitis/education on treatment Review of HEP and addition of static balance challenges  07/03/22 Nustep seat 7 level 4 x 5 minutes HR x 20  Step up 4 inch 2x 10  Lateral step up 4 inch 2 x 10  Standing hip abduction with GTB at knees 2x 10  STS 1x 10    06/12/22 Progress note FOTO 59% 5 times sit to stand 21 sec today  - Supine Bridge  - 2 x daily - 7 x weekly - 1 sets - 8 reps - Supine Quad Set  - 2 x daily - 7 x weekly - 1 sets - 10 reps - 5" hold - Active Straight Leg Raise with Quad Set  - 2 x daily - 7 x weekly - 1 sets - 10 reps   05/08/22 Nustep seat 6 level 4 with UE x 5'  Standing // bars Heel raises 2 x 10 Slant board 3 x 20" Hip vectors 2 x 5 bilaterally 4" step ups no UE support 2 x 10  4" Lateral step ups fingertip support 2 x 10 sit to stand x 10 Red ball flexion with Ue's  x 10 Sit to stand x 10 on foam with red ball flexion with Ue's x  10   05/06/22 Standing // bars Heel raises 2 x 10 Slant board 3 x 20" Side stepping on blue line 2RT Side stepping in // bars RTB around knees 4RT 6inch step up anterior and lateral x 10 each with b/l UE suppor 6" forward step up Lt, 4" Rt 10X with 1 UE support Tandem stance on foam with UE flexion using 1# bar x 1' each on foam TKE with bodycraft 3 plates x 20 each LE with 5" holds Sit to stand from chair with red med ball flexed in UE's 2 x 10 Nustep seat 6 level 4 with UE x 5'      PATIENT EDUCATION:  Access Code: [email or web address] URL: [email or web address] Date: 07/04/2022 Prepared by: Roseanne Reno  Exercises - Tandem Stance in Corner  - 2 x daily - 7 x weekly - 2 sets - 5 reps - 30 seconds hold - Single Leg Stance  - 2 x daily - 7 x weekly - 2 sets - 5 reps - 30 seconds hold Vector stance 5X3" holds each side  Education details: added hip vectors 8/2 HEP, LE alignment with exercise Person educated: Patient Education method: Mining engineer Education  comprehension: verbalized understanding and returned demonstration     HOME EXERCISE PROGRAM: Access Code: [email or web address]  07/03/22- Standing Hip Abduction with Resistance at Ankles and Counter Support  - 1 x daily - 7 x weekly - 3 sets - 10 reps - Sit to Stand with Arms Crossed  - 1 x daily - 7 x weekly - 2 sets - 10 reps   URL: [email or web address] Date: 06/12/2022 Prepared by: [facility]  Exercises - Supine Bridge  - 2 x daily - 7 x weekly - 1 sets - 8 reps - Supine Quad Set  - 2 x daily - 7 x weekly - 1 sets - 10 reps - 5" hold - Active Straight Leg Raise with Quad Set  - 2 x daily - 7 x weekly - 1 sets - 10 reps  04/19/22 Access Code: [email or web address] URL: [email or web address] Date: 04/19/2022 Prepared by: Leota Jacobsen Exercises - Hip Extension with Resistance Loop  - 1 x daily - 7 x weekly - 3 sets - 10 reps - Standing Hip Flexion with Resistance Loop  - 1 x daily - 7 x weekly - 3  sets - 10 reps - Standing Hip Abduction Kicks  - 1 x daily - 7 x weekly - 3 sets - 10 reps  04/15/22: STS  5/4/2: Quad Set, SLR, heelslide     ASSESSMENT:   CLINICAL IMPRESSION: Reassessment completed with noted improvement in strength and stability from baseline and last reassessment.  Pt now with all LE mm  strength testing at 4+-5/5 and has met all goals.  Updated HEP to include static balance challenges and educated pt on bursitis.  Encouraged to try ice or ice massage to help reduce pain and also suggested seeing a vascular MD regarding her PAD as she currently is wearing knee high compression garments with edema below and c/o LE pain in the mornings.  PT verbalized understanding.  Pt has all print outs of HEP and is also a member of the [employer] and senior center.  Educated  she could get with the trainers to help her with the equipment there as well.    Patient has met all short and long term goals with ability to complete HEP and improvement in symptoms, strength, activity tolerance, and functional mobility. She remains limited by impaired glute strength but it has improved greatly since beginning PT. Patient agreeable to transition to HEP and self management of symptoms. Patient discharged from physical therapy at this time.  7:52 AM, 07/08/22 Mearl Latin PT, DPT Physical Therapist at [facility]    OBJECTIVE IMPAIRMENTS Abnormal gait, decreased activity tolerance, decreased balance, decreased mobility, difficulty walking, decreased ROM, decreased strength, hypomobility, increased fascial restrictions, impaired flexibility, improper body mechanics, and pain.    ACTIVITY LIMITATIONS cleaning, community activity, driving, meal prep, laundry, yard work, shopping, and yard work.    PERSONAL FACTORS Age are also affecting patient's functional outcome.      REHAB POTENTIAL: Good   CLINICAL DECISION MAKING: Stable/uncomplicated   EVALUATION COMPLEXITY: Low      GOALS: SHORT TERM GOALS: Target date: 06/26/2022   Patient will be independent with initial HEP and self-management strategies to improve functional outcomes Baseline:  Goal status: MET   LONG TERM GOALS: Target date: 07/10/2022   Patient will be independent with advanced HEP and self-management strategies to improve functional outcomes Baseline:  Goal status: MET   2.  Patient will improve FOTO score to predicted value to indicate improvement in functional  outcomes Baseline: 50%; 06/12/22 59%, 8/3: 68.6% Predicted Value:  61%  Goal status: MET   3.  Patient will be able to perform stand x 5 in < 12 seconds to demonstrate improvement in functional mobility and reduced risk for falls.  Baseline: 14.3 sec; 06/12/22 21 sec, 8/3: 10.7 sec Goal status: MET   4. Patient will have equal to or > 4+/5 MMT throughout BLE to improve ability to perform functional mobility, stair ambulation and ADLs.  Baseline: See MMT  Goal status: MET     PLAN: PT FREQUENCY: 2x/week   PT DURATION: 4 weeks   PLANNED INTERVENTIONS: Therapeutic exercises, Therapeutic activity, Neuromuscular re-education, Balance training, Gait training, Patient/Family education, Joint manipulation, Joint mobilization, Stair training, Aquatic Therapy, Dry Needling, Electrical stimulation, Spinal manipulation, Spinal mobilization, Cryotherapy, Moist heat, scar mobilization, Taping, Traction, Ultrasound, Biofeedback, Ionotophoresis 54m/ml Dexamethasone, and Manual therapy.     PLAN FOR NEXT SESSION: Discharge as all goals met.  12:03 PM, 07/04/22 ATeena Irani PTA/CLT CAlvaradoPh: [phone]

## 2022-07-08 ENCOUNTER — Encounter (HOSPITAL_COMMUNITY): Payer: Medicare HMO

## 2022-07-09 ENCOUNTER — Other Ambulatory Visit: Payer: Self-pay

## 2022-07-09 NOTE — Patient Outreach (Signed)
[place] [facility]) Care Management  07/09/2022  LAURENCE FOLZ 10/27/1941 [id number]   Telephone call to patient for disease management follow up.   No answer.  HIPAA compliant voice message left.    Plan: If no return call, RN CM will attempt patient again in August.  Christella App J Shaunee Mulkern, RN, MSN [employer] Care Management Coordinator Direct Line [phone] Toll Free: [phone]  Fax: [phone]

## 2022-07-10 ENCOUNTER — Encounter (HOSPITAL_COMMUNITY): Payer: Medicare HMO

## 2022-07-15 ENCOUNTER — Other Ambulatory Visit: Payer: Self-pay

## 2022-07-15 NOTE — Patient Outreach (Signed)
[facility]) Care Management  07/15/2022  [name] February 23, 1941 [id number]   Telephone cal to patient for case closure. Patient doing well.  No concerns.    Care Plan : RN CM Plan of Care  Updates made by Jon Billings, RN since 07/15/2022 12:00 AM  Completed 07/15/2022   Problem: Chronic disease management and care coordination of HTN. Resolved 07/15/2022  Priority: High     Long-Range Goal: Development of plan of care for management of HTN Completed 07/15/2022  Start Date: 02/19/2022  Expected End Date: 12/01/2022  Recent Progress: On track  Priority: High  Note:   Current Barriers:  Chronic Disease Management support and education needs related to HTN   RNCM Clinical Goal(s):  Patient will verbalize basic understanding of  HTN disease process and self health management plan as evidenced by Blood pressure less than 140/80 continue to work with RN Care Manager to address care management and care coordination needs related to  HTN as evidenced by adherence to CM Team Scheduled appointments through collaboration with RN Care manager, provider, and care team.   Interventions: Education and support related to HTN Inter-disciplinary care team collaboration (see longitudinal plan of care) Evaluation of current treatment plan related to  self management and patient's adherence to plan as established by provider   Hypertension Interventions:  (Status:  Goal on track:  Yes.) Long Term Goal Last practice recorded BP readings:  BP Readings from Last 3 Encounters:  01/20/22 (!) 146/82  09/26/21 126/80  07/31/21 (!) 147/93  Most recent eGFR/CrCl: No results found for: EGFR  No components found for: CRCL  Evaluation of current treatment plan related to hypertension self management and patient's adherence to plan as established by provider Reviewed medications with patient and discussed importance of compliance Discussed plans with patient for ongoing care  management follow up and provided patient with direct contact information for care management team  03/20/22 Patient reports blood pressure better but still fluctuates.  She sees the physician on tomorrow for follow up.  Last report BP 139/86. Discussed hypertension management and importance to reduce risk for heart attack and stroke.    04/17/22 Patient blood pressure better.  Last blood pressure 127/83.  Discussed hypertension management.  No concerns.    06/12/22 Patient reports blood pressure up.  Recent medication changes. Blood pressure 185-165/90's.  Discussed blood pressure control.    Patient Goals/Self-Care Activities: Hypertension Take all medications as prescribed Attend all scheduled provider appointments check blood pressure daily keep a blood pressure log take blood pressure log to all doctor appointments call doctor for signs and symptoms of high blood pressure Limit salt intake  Follow Up Plan: Patient meeting goals.  No concerns.  Closing case.       Jone Baseman, RN, MSN [facility] Care Management Care Management Coordinator Direct Line [phone] Toll Free: [phone]  Fax: [phone]

## 2022-07-30 DIAGNOSIS — R0600 Dyspnea, unspecified: Secondary | ICD-10-CM | POA: Diagnosis not present

## 2022-07-30 DIAGNOSIS — I10 Essential (primary) hypertension: Secondary | ICD-10-CM | POA: Diagnosis not present

## 2022-07-30 DIAGNOSIS — G629 Polyneuropathy, unspecified: Secondary | ICD-10-CM | POA: Diagnosis not present

## 2022-08-01 ENCOUNTER — Ambulatory Visit: Payer: Medicare HMO | Admitting: Internal Medicine

## 2022-08-01 ENCOUNTER — Encounter: Payer: Self-pay | Admitting: Internal Medicine

## 2022-08-01 DIAGNOSIS — J45991 Cough variant asthma: Secondary | ICD-10-CM | POA: Diagnosis not present

## 2022-08-01 DIAGNOSIS — R0609 Other forms of dyspnea: Secondary | ICD-10-CM

## 2022-08-01 MED ORDER — FAMOTIDINE 20 MG PO TABS: ORAL_TABLET | ORAL | 11 refills | Status: DC

## 2022-08-01 NOTE — Patient Instructions (Addendum)
Pantoprazole (protonix) 40 mg   Take  30-60 min before first meal of the day and Pepcid (famotidine)  20 mg after supper or at bedtime until return to office - this is the best way to tell whether stomach acid is contributing to your problem of cough espeically the one you have in the mornings     Plan A = Automatic = Always=   Symbicort 80 Take 2 puffs first thing in am and then another 2 puffs about 12 hours later.    Work on inhaler technique:  relax and gently blow all the way out then take a nice smooth full deep breath back in, triggering the inhaler at same time you start breathing in.  Hold breath in for at least  5 seconds if you can. Blow out symbicort out  thru nose. Rinse and gargle with water when done.  If mouth or throat bother you at all,  try brushing teeth/gums/tongue with arm and hammer toothpaste/ make a slurry and gargle and spit out.     Plan B = Backup (to supplement plan A, not to replace it) Only use your albuterol inhaler as a rescue medication to be used if you can't catch your breath by resting or doing a relaxed purse lip breathing pattern.  - The less you use it, the better it will work when you need it. - Ok to use the inhaler up to 2 puffs  every 4 hours if you must but call for appointment if use goes up over your usual need - Don't leave home without it !!  (think of it like the spare tire for your car)     Please remember to go to the lab department   for your tests - we will call you with the results when they are available.      Please schedule a follow up office visit in 6 weeks, call sooner if need

## 2022-08-01 NOTE — Assessment & Plan Note (Signed)
Onset 2102 p stopped smoking 1999 Spirometry 08/12/2012 FEV1 2.23 (98%)  Ratio 79  ? On symbicort  - 01/24/2017  Walked RA x 3 laps @ 185 ft each stopped due to  End of study, nl pace, no   desat   Min sob - Spirometry 04/15/2017  Completely nl including curvature  - 04/15/2017  Walked RA x 3 laps @ 185 ft each stopped due to  End of study, nl pace, min sob, no desat  - 08/01/2022   Walked on RA  x  3  lap(s) =  approx 450  ft  @ mod to fast pace, stopped due to end of study  with lowest 02 sats  96%  With minimal sob   Symptoms are markedly disproportionate to objective findings and not clear to what extent this is actually a pulmonary  problem but pt does appear to have difficult to sort out respiratory symptoms of unknown origin for which  DDX  = almost all start with A and  include Adherence, Ace Inhibitors, Acid Reflux, Active Sinus Disease, Alpha 1 Antitripsin deficiency, Anxiety masquerading as Airways dz,  ABPA,  Allergy(esp in young), Aspiration (esp in elderly), Adverse effects of meds,  Active smoking or Vaping, A bunch of PE's/clot burden (a few small clots can't cause this syndrome unless there is already severe underlying pulm or vascular dz with poor reserve),  Anemia or thyroid disorder, plus two Bs  = Bronchiectasis and Beta blocker use..and one C= CHF     Adherence is always the initial "prime suspect" and is a multilayered concern that requires a "trust but verify" approach in every patient - starting with knowing how to use medications, especially inhalers, correctly, keeping up with refills and understanding the fundamental difference between maintenance and prns vs those medications only taken for a very short course and then stopped and not refilled.  - see hfa teaching sep a/p - return in 6 weeks The principal here is that until we are certain that the  patients are doing what we've asked  Them  to do, it makes no sense to ask them to do more - ie the trust but verify approach will  work best here.    ? Allergy /asthma > check allergy screen/ symb 80 2bid and approp saba Re SABA :  I spent extra time with pt today reviewing appropriate use of albuterol for prn use on exertion with the following points: 1) saba is for relief of sob that does not improve by walking a slower pace or resting but rather if the pt does not improve after trying this first. 2) If the pt is convinced, as many are, that saba helps recover from activity faster then it's easy to tell if this is the case by re-challenging : ie stop, take the inhaler, then p 5 minutes try the exact same activity (intensity of workload) that just caused the symptoms and see if they are substantially diminished or not after saba 3) if there is an activity that reproducibly causes the symptoms, try the saba 15 min before the activity on alternate days   If in fact the saba really does help, then fine to continue to use it prn but advised may need to look closer at the maintenance regimen being used to achieve better control of airways disease with exertion.    ? Anxiety/depression/deconditionng  > usually at the bottom of this list of usual suspects but should be much higher on this pt's based  on H and P and note already on psychotropics and may interfere with adherence and also interpretation of response or lack thereof to symptom management which can be quite subjective.   ? A bunch of PEs > ddimer   ? chf > BNP

## 2022-08-01 NOTE — Assessment & Plan Note (Addendum)
Allergy profile 01/24/2017 >  Eos 0.3 /  IgE  702  Dog > cat,Trees/grass/ ragweed    - 01/24/2017  continue symb 80 2bid  - Sinus CT 01/24/2017 >>> No evidence of sinusitis.  - Spirometry 04/15/2017  FEV1 2.11 (100%)  Ratio 80 with nl contour in effort indep portion  - 08/01/2022  After extensive coaching inhaler device,  effectiveness =    90% increase symb 80 to 2 bid and max rx for gerd x 6 weeks then return to regroup - Allergy screen 08/01/2022 >  Eos 0.1 /  IgE  125   Each maintenance medication was reviewed in detail including emphasizing most importantly the difference between maintenance and prns and under what circumstances the prns are to be triggered using an action plan format where appropriate.  Total time for H and P, chart review, counseling, reviewing hfa device(s) , directly observing portions of ambulatory 02 saturation study/ and generating customized AVS unique to this office visit / same day charting > 45 min for pt not seen in > 5 y with mulitple refractory resp problems of ? Etiology

## 2022-08-01 NOTE — Progress Notes (Signed)
Subjective:     Patient ID: [name], female   DOB: [birth year],    MRN: [id number]    Brief patient profile:  25  yowf  Quit smokng  1999 in "perfect health" until  around 2012 cough/sob dx copd [facility] but spirometry was wnl  08/12/12 and did fine on symbicort then changed over to spiriva respimat and good until Dec 2017 then bad cough/ wheeze  rx  UC pred x 2 and back on symbicort referred to pulmonary clinic 01/24/2017 by [name] for re-eval ? Copd    History of Present Illness  01/24/2017 1st [name] visit/ [name]   Chief Complaint  Patient presents with   Pulmonary Consult    Self referral for COPD.  She states that she was dxed in 2012. She states she was dxed with Bronchitis end of Dec 2017 and feels like she is just now getting over this. She c/o increased SOB over the past few wks and was started on Symbicort by PCP and this has helped some.   MMRC3 = can't walk 100 yards even at a slow pace at a flat grade s stopping due to sob  Can do HT but not [employer]  Sleeps fine/ cough starts up w/in min mucus light yellow / sorethroat esp in afternoons  Plan A = Automatic = symbicort 80 Take 2 puffs first thing in am and then another 2 puffs about 12 hours later and stop spiriva  Work on inhaler technique:   Plan B = Backup Only use your albuterol as a rescue medication t Pantoprazole (protonix) 40 mg   Take  30-60 min before first meal of the day and Pepcid (famotidine)  20 mg one @  bedtime until return to French - this is the best way to tell whether stomach acid is contributing to your problem.   Please see patient coordinator before you leave today  to schedule sinus CT > neg  GERD diet    02/10/18   [name]  eval Stop Benadryl and Vitamin E .  Continue on Protonix and Pepcid .  GERD diet  Continue on Symbicort, rinse after use.  May use Delsym As needed for cough.     04/15/2017  f/u ov/[name] re: cough variant asthma/ chronic doe ? Etiology / Pos Atopy  Chief  Complaint  Patient presents with   Follow-up    Breathing is back to her normal baseline. She states still has occ dry cough.   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 Has seen [name] before.   Dyspnea:  150 ft to out building flat gets sob every time x one year prior to OV  but not really progressing  Cough: 1st thing in am then  better for rest of day  p cough up min mucoid sputum Sleeping:  bed is flat/ one pillow  SABA use: once a week  02: none  Covid status: vax max      No obvious day to day or daytime variability or assoc excess/ purulent sputum or mucus plugs or hemoptysis or cp or chest tightness, subjective wheeze or overt sinus or hb symptoms.   Sleeping  without nocturnal  or early am exacerbation  of respiratory  c/o's or need for noct saba. Also denies any obvious fluctuation of symptoms with weather or environmental changes or other aggravating or alleviating factors except as outlined above   No unusual exposure hx or h/o childhood pna/ asthma or knowledge of premature birth.  Current Allergies, Complete Past Medical History, Past Surgical History, Family History, and Social History were reviewed in [employer] record.  ROS  The following are not active complaints unless bolded Hoarseness, sore throat, dysphagia, dental problems, itching, sneezing,  nasal congestion or  discharge of excess mucus or purulent secretions, ear ache,   fever, chills, sweats, unintended wt loss or wt gain, classically pleuritic or exertional cp,  orthopnea pnd or arm/hand swelling  or leg swelling, presyncope, palpitations, abdominal pain, anorexia, nausea, vomiting, diarrhea  or change in bowel habits or change in bladder habits, change in stools or change in urine, dysuria, hematuria,  rash, arthralgias, visual complaints, headache, numbness, weakness or ataxia or problems with walking since [name] March 2022 or coordination,  change in mood or  memory.        Current Meds  Medication Sig   acetaminophen (TYLENOL) 500 MG tablet Take 500-1,000 mg by mouth as needed for mild pain or moderate pain.   albuterol (VENTOLIN HFA) 108 (90 Base) MCG/ACT inhaler Inhale 2 puffs into the lungs every 6 (six) hours as needed for wheezing or shortness of breath.   ALPRAZolam (XANAX) 0.5 MG tablet Take 0.5 mg by mouth in the morning, at noon, and at bedtime. (1000, 1500, AT BEDTIME)   amLODipine (NORVASC) 5 MG tablet Take 5 mg by mouth 2 (two) times daily.   aspirin EC 81 MG tablet Take 81 mg by mouth daily. Swallow whole.   azelastine (ASTELIN) 0.1 % nasal spray Place into both nostrils.   Cholecalciferol (VITAMIN D3) 50 MCG (2000 UT) capsule Take 2,000 Units by mouth daily.   diclofenac Sodium (VOLTAREN) 1 % GEL Apply 1 application topically as needed (back pain).   levothyroxine (SYNTHROID) 50 MCG tablet Take 1 tablet (50 mcg total) by mouth daily before breakfast.   losartan (COZAAR) 50 MG tablet Take 50 mg by mouth 2 (two) times daily.   Multiple Vitamin (MULTIVITAMIN WITH MINERALS) TABS tablet Take 1 tablet by mouth daily in the afternoon.   mupirocin ointment (BACTROBAN) 2 % APPLY TO AFFECTED AREA DAILY   pantoprazole (PROTONIX) 40 MG tablet TAKE 1 TABLET BY MOUTH ONCE A DAY.   polyethylene glycol (MIRALAX / GLYCOLAX) 17 g packet Take 17 g by mouth daily.   raloxifene (EVISTA) 60 MG tablet Take 1  tablet (60 mg total) by mouth every morning. (Patient taking differently: Take 60 mg by mouth every other day. IN THE MORNING)   SYMBICORT 80-4.5 MCG/ACT inhaler INHALE 2 PUFFS INTO THE LUNGS TWICE DAILY. (Patient taking differently: Inhale 2 puffs into the lungs daily.)   venlafaxine XR (EFFEXOR-XR) 75 MG 24 hr capsule venlafaxine ER 75 mg capsule,extended release 24 hr  Objective:   Physical Exam   Wts  08/01/2022       149   04/15/2017       158   01/24/17 158 lb (71.7 kg)  01/22/17 157 lb (71.2 kg)  09/25/16 165 lb (74.8 kg)     Vital signs reviewed  08/01/2022  - Note at rest 02 sats  96% on RA   General appearance:    amb wf nad    HEENT : Oropharynx  clear     Nasal turbinates nl    NECK :  without  apparent JVD/ palpable Nodes/TM    LUNGS: no acc muscle use,  Nl contour chest which is clear to A and P bilaterally without cough on insp or exp maneuvers   CV:  RRR  no s3 or murmur or increase in P2, and no palpable edema wearing elastic hose both LE's  ABD:  soft and nontender with nl inspiratory excursion in the supine position. No bruits or organomegaly appreciated   MS:  Nl gait/ ext warm without deformities Or obvious joint restrictions  calf tenderness, cyanosis or clubbing    SKIN: warm and dry without lesions    NEURO:  alert, approp, nl sensorium with  no motor or cerebellar deficits apparent.       I personally reviewed images and agree with radiology impression as follows:  CXR:   06/24/22 No acute findings    Labs ordered/ reviewed:      Chemistry      Component Value Date/Time   NA 139 03/15/2021 1220   NA 141 09/24/2018 1204   K 3.3 (L) 03/15/2021 1220   CL 102 03/15/2021 1220   CO2 25 03/15/2021 1220   BUN 10 03/15/2021 1220   BUN 13 09/24/2018 1204   CREATININE 0.66 03/15/2021 1220   CREATININE 0.94 (H) 01/22/2021 1419      Component Value Date/Time   CALCIUM 8.8 (L) 03/15/2021 1220   ALKPHOS 69 03/15/2021  1220   AST 24 03/15/2021 1220   ALT 15 03/15/2021 1220   BILITOT 0.4 03/15/2021 1220   BILITOT 0.4 09/24/2018 1204        Lab Results  Component Value Date   WBC 8.0 08/01/2022   HGB 10.9 (L) 08/01/2022   HCT 32.9 (L) 08/01/2022   MCV 94 08/01/2022   PLT 215 08/01/2022     Lab Results  Component Value Date   DDIMER 0.41 08/01/2022      Lab Results  Component Value Date   TSH 1.740 03/18/2022      BNP   08/06/22    94              Assessment:

## 2022-08-03 LAB — CBC WITH DIFFERENTIAL/PLATELET
Basophils Absolute: 0.1 10*3/uL (ref 0.0–0.2)
Basos: 1 %
EOS (ABSOLUTE): 0.1 10*3/uL (ref 0.0–0.4)
Eos: 1 %
Hematocrit: 32.9 % — ABNORMAL LOW (ref 34.0–46.6)
Hemoglobin: 10.9 g/dL — ABNORMAL LOW (ref 11.1–15.9)
Immature Grans (Abs): 0 10*3/uL (ref 0.0–0.1)
Immature Granulocytes: 0 %
Lymphocytes Absolute: 1.9 10*3/uL (ref 0.7–3.1)
Lymphs: 24 %
MCH: 31.1 pg (ref 26.6–33.0)
MCHC: 33.1 g/dL (ref 31.5–35.7)
MCV: 94 fL (ref 79–97)
Monocytes Absolute: 0.7 10*3/uL (ref 0.1–0.9)
Monocytes: 9 %
Neutrophils Absolute: 5.2 10*3/uL (ref 1.4–7.0)
Neutrophils: 65 %
Platelets: 215 10*3/uL (ref 150–450)
RBC: 3.5 x10E6/uL — ABNORMAL LOW (ref 3.77–5.28)
RDW: 14 % (ref 11.7–15.4)
WBC: 8 10*3/uL (ref 3.4–10.8)

## 2022-08-03 LAB — D-DIMER, QUANTITATIVE: D-DIMER: 0.41 mg/L FEU (ref 0.00–0.49)

## 2022-08-03 LAB — BRAIN NATRIURETIC PEPTIDE: BNP: 93.9 pg/mL (ref 0.0–100.0)

## 2022-08-03 LAB — IGE: IgE (Immunoglobulin E), Serum: 135 IU/mL (ref 6–495)

## 2022-08-07 DIAGNOSIS — Z23 Encounter for immunization: Secondary | ICD-10-CM | POA: Diagnosis not present

## 2022-08-12 ENCOUNTER — Telehealth: Payer: Self-pay | Admitting: Internal Medicine

## 2022-08-12 DIAGNOSIS — J069 Acute upper respiratory infection, unspecified: Secondary | ICD-10-CM | POA: Diagnosis not present

## 2022-08-12 NOTE — Telephone Encounter (Signed)
Called and spoke to patient. She had questions about what "recs" meant on result note. Let her know that recs meant recommendations and she verified she had no other questions about her results. Nothing further needed

## 2022-08-12 NOTE — Telephone Encounter (Signed)
Patient received lab work letter/ results in the mail and she has some questions she would like to discuss with a nurse.   Please call back at [phone].

## 2022-08-15 ENCOUNTER — Institutional Professional Consult (permissible substitution): Payer: Medicare HMO | Admitting: Internal Medicine

## 2022-08-22 DIAGNOSIS — K644 Residual hemorrhoidal skin tags: Secondary | ICD-10-CM | POA: Insufficient documentation

## 2022-08-31 DIAGNOSIS — I10 Essential (primary) hypertension: Secondary | ICD-10-CM | POA: Diagnosis not present

## 2022-08-31 DIAGNOSIS — E119 Type 2 diabetes mellitus without complications: Secondary | ICD-10-CM | POA: Diagnosis not present

## 2022-08-31 DIAGNOSIS — E039 Hypothyroidism, unspecified: Secondary | ICD-10-CM | POA: Diagnosis not present

## 2022-08-31 DIAGNOSIS — E782 Mixed hyperlipidemia: Secondary | ICD-10-CM | POA: Diagnosis not present

## 2022-09-05 DIAGNOSIS — R7301 Impaired fasting glucose: Secondary | ICD-10-CM | POA: Diagnosis not present

## 2022-09-05 DIAGNOSIS — I10 Essential (primary) hypertension: Secondary | ICD-10-CM | POA: Diagnosis not present

## 2022-09-06 DIAGNOSIS — J0100 Acute maxillary sinusitis, unspecified: Secondary | ICD-10-CM | POA: Diagnosis not present

## 2022-09-10 DIAGNOSIS — I10 Essential (primary) hypertension: Secondary | ICD-10-CM | POA: Diagnosis not present

## 2022-09-10 DIAGNOSIS — K219 Gastro-esophageal reflux disease without esophagitis: Secondary | ICD-10-CM | POA: Diagnosis not present

## 2022-09-10 DIAGNOSIS — E119 Type 2 diabetes mellitus without complications: Secondary | ICD-10-CM | POA: Diagnosis not present

## 2022-09-10 DIAGNOSIS — F418 Other specified anxiety disorders: Secondary | ICD-10-CM | POA: Diagnosis not present

## 2022-09-10 DIAGNOSIS — M545 Low back pain, unspecified: Secondary | ICD-10-CM | POA: Diagnosis not present

## 2022-09-10 DIAGNOSIS — E039 Hypothyroidism, unspecified: Secondary | ICD-10-CM | POA: Diagnosis not present

## 2022-09-10 DIAGNOSIS — J309 Allergic rhinitis, unspecified: Secondary | ICD-10-CM | POA: Insufficient documentation

## 2022-09-10 DIAGNOSIS — R600 Localized edema: Secondary | ICD-10-CM | POA: Diagnosis not present

## 2022-09-10 DIAGNOSIS — Z0000 Encounter for general adult medical examination without abnormal findings: Secondary | ICD-10-CM | POA: Diagnosis not present

## 2022-09-10 DIAGNOSIS — E876 Hypokalemia: Secondary | ICD-10-CM | POA: Diagnosis not present

## 2022-09-12 ENCOUNTER — Encounter: Payer: Self-pay | Admitting: Internal Medicine

## 2022-09-12 ENCOUNTER — Ambulatory Visit: Payer: Medicare HMO | Admitting: Internal Medicine

## 2022-09-12 DIAGNOSIS — J45991 Cough variant asthma: Secondary | ICD-10-CM

## 2022-09-12 NOTE — Patient Instructions (Addendum)
Work on inhaler technique:  relax and gently blow all the way out then take a nice smooth full deep breath back in, triggering the inhaler at same time you start breathing in.  Hold breath in for at least  5 seconds if you can. Blow out symbicort 80  thru nose. Rinse and gargle with water when done.  If mouth or throat bother you at all,  try brushing teeth/gums/tongue with arm and hammer toothpaste/ make a slurry and gargle and spit out.   GERD (REFLUX)  is an extremely common cause of respiratory symptoms just like yours , many times with no obvious heartburn at all but urge to clear throat which just makes your cough worse   It can be treated with medication, but also with lifestyle changes including elevation of the head of your bed (ideally with 6 -8inch blocks under the headboard of your bed),  Smoking cessation, avoidance of late meals, excessive alcohol, and avoid fatty foods, chocolate, peppermint, colas, red wine, and acidic juices such as orange juice.  NO MINT OR MENTHOL PRODUCTS SO NO COUGH DROPS  USE SUGARLESS CANDY INSTEAD (Jolley ranchers or Stover's or Life Savers) or even ice chips will also do - the key is to swallow to prevent all throat clearing. NO OIL BASED VITAMINS - use powdered substitutes.  Avoid fish oil when coughing.   See Dr Ernst Bowler next if not satisfied   If you are satisfied with your treatment plan,  let your doctor know and he/she can either refill your medications or you can return here when your prescription runs out.     If in any way you are not 100% satisfied,  please tell us.  If 100% better, tell your friends!  Pulmonary follow up is as needed

## 2022-09-12 NOTE — Progress Notes (Signed)
Subjective:     Patient ID: [name], female   DOB: 1941/03/24,    MRN: [id number]    Brief patient profile:  21  yowf  Quit smokng  1999 in "perfect health" until  around 2012 cough/sob dx copd [facility] but spirometry was wnl  08/12/12 and did fine on symbicort then changed over to spiriva respimat and good until Dec 2017 then bad cough/ wheeze  rx  UC pred x 2 and back on symbicort referred to pulmonary clinic 01/24/2017 by [name] for re-eval ? Copd    History of Present Illness  01/24/2017 1st [facility] office visit/ [name]   Chief Complaint  Patient presents with   Pulmonary Consult    Self referral for COPD.  She states that she was dxed in 2012. She states she was dxed with Bronchitis end of Dec 2017 and feels like she is just now getting over this. She c/o increased SOB over the past few wks and was started on Symbicort by PCP and this has helped some.   MMRC3 = can't walk 100 yards even at a slow pace at a flat grade s stopping due to sob  Can do HT but not [employer]  Sleeps fine/ cough starts up w/in min mucus light yellow / sorethroat esp in afternoons  Plan A = Automatic = symbicort 80 Take 2 puffs first thing in am and then another 2 puffs about 12 hours later and stop spiriva  Work on inhaler technique:   Plan B = Backup Only use your albuterol as a rescue medication t Pantoprazole (protonix) 40 mg   Take  30-60 min before first meal of the day and Pepcid (famotidine)  20 mg one @  bedtime until return to office - this is the best way to tell whether stomach acid is contributing to your problem.   Please see patient coordinator before you leave today  to schedule sinus CT > neg  GERD diet    02/10/18   [name]  eval Stop Benadryl and Vitamin E .  Continue on Protonix and Pepcid .  GERD diet  Continue on Symbicort, rinse after use.  May use Delsym As needed for cough.     04/15/2017  f/u ov/[name] re: cough variant asthma/ chronic doe ? Etiology / Pos Atopy  Chief  Complaint  Patient presents with   Follow-up    Breathing is back to her normal baseline. She states still has occ dry cough.   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 Has seen [name] before.   Dyspnea:  150 ft to out building flat gets sob every time x one year prior to OV  but not really progressing  Cough: 1st thing in am then  better for rest of day  p cough up min mucoid sputum Sleeping:  bed is flat/ one pillow  SABA use: once a week  02: none  Covid status: vax max   Rec Pantoprazole (protonix) 40 mg   Take  30-60 min before first meal of the day and Pepcid (famotidine)  20 mg after supper or at bedtime   Plan A = Automatic = Always=   Symbicort 80 Take 2 puffs first thing in am and then another 2 puffs about 12 hours later.  Work on inhaler technique:  Plan B = Backup (to supplement plan A, not to replace it) Only use your albuterol inhaler as a rescue medication  Please remember to go to the lab department    - Allergy screen 08/01/2022 >  Eos 0.1 /  IgE  125.         09/12/2022  f/u ov/[place] office/[name] re: cough variant asthma  maint on symb 80 2bid  though hfa not optimal  Chief Complaint  Patient presents with   Follow-up    Patient has not been feeling well the last few weeks. Was walking more before he got sick and was feeling better but has not been walking since she has been sick/not feeling  well.   Dyspnea:  15 min twice daily walking  Cough: worse with onset of "allergies" but not better with prednisone / worse p stirs does not keep her up /min mucoid Sleeping: flat bed s resp cc  SABA use: none  02: none     No obvious day to day or daytime variability or assoc excess/ purulent sputum or mucus plugs or hemoptysis or cp or chest tightness, subjective wheeze or overt sinus or hb symptoms.   Sleeping  without nocturnal  or early am exacerbation  of respiratory  c/o's or need for noct saba. Also denies any obvious fluctuation of symptoms with weather or environmental changes or other aggravating or alleviating factors except as outlined above   No unusual exposure hx or h/o childhood pna/ asthma or knowledge of premature birth.  Current Allergies, Complete Past Medical History, Past Surgical History, Family History, and Social History were reviewed in [employer] record.  ROS  The following are not active complaints unless bolded Hoarseness, sore throat, dysphagia, dental problems, itching, sneezing,  nasal congestion or discharge of excess mucus or purulent secretions, ear ache,   fever, chills, sweats, unintended wt loss or wt gain, classically pleuritic or exertional cp,  orthopnea pnd or arm/hand swelling  or leg swelling, presyncope, palpitations, abdominal pain, anorexia, nausea, vomiting, diarrhea  or change in bowel habits or change in bladder habits, change in stools or change in urine, dysuria, hematuria,  rash, arthralgias, visual complaints, headache, numbness, weakness or ataxia or problems with walking or coordination,  change in mood= anxious or  memory.        Current Meds  Medication Sig   acetaminophen (TYLENOL) 500 MG tablet Take 500-1,000 mg by mouth as needed for mild pain or moderate pain.   albuterol (VENTOLIN HFA) 108 (90 Base) MCG/ACT inhaler Inhale 2 puffs into the lungs every 6 (six) hours as needed for wheezing or shortness of  breath.   ALPRAZolam (XANAX) 0.5 MG tablet Take 0.5 mg by mouth in the morning, at noon, and at bedtime. (1000, 1500, AT BEDTIME)   amLODipine (NORVASC) 5 MG tablet Take 5 mg by mouth 2 (two) times daily.   aspirin EC 81 MG tablet Take 81 mg by mouth daily. Swallow whole.   azelastine (ASTELIN) 0.1 % nasal spray  Place into both nostrils.   cetirizine (ZYRTEC) 5 MG tablet    Cholecalciferol (VITAMIN D3) 50 MCG (2000 UT) capsule Take 2,000 Units by mouth daily.   diclofenac Sodium (VOLTAREN) 1 % GEL Apply 1 application topically as needed (back pain).   famotidine (PEPCID) 20 MG tablet One after supper   levothyroxine (SYNTHROID) 50 MCG tablet Take 1 tablet (50 mcg total) by mouth daily before breakfast.   losartan (COZAAR) 50 MG tablet Take 50 mg by mouth 2 (two) times daily.   Multiple Vitamin (MULTIVITAMIN WITH MINERALS) TABS tablet Take 1 tablet by mouth daily in the afternoon.   mupirocin ointment (BACTROBAN) 2 % APPLY TO AFFECTED AREA DAILY   pantoprazole (PROTONIX) 40 MG tablet TAKE 1 TABLET BY MOUTH ONCE A DAY.   polyethylene glycol (MIRALAX / GLYCOLAX) 17 g packet Take 17 g by mouth daily.   raloxifene (EVISTA) 60 MG tablet Take 1 tablet (60 mg total) by mouth every morning. (Patient taking differently: Take 60 mg by mouth every other day. IN THE MORNING)   SYMBICORT 80-4.5 MCG/ACT inhaler INHALE 2 PUFFS INTO THE LUNGS TWICE DAILY. (Patient taking differently: Inhale 2 puffs into the lungs daily.)   venlafaxine XR (EFFEXOR-XR) 75 MG 24 hr capsule venlafaxine ER 75 mg capsule,extended release 24 hr                     Objective:   Physical Exam   Wts  09/12/2022     152 08/01/2022       149   04/15/2017       158   01/24/17 158 lb (71.7 kg)  01/22/17 157 lb (71.2 kg)  09/25/16 165 lb (74.8 kg)     Vital signs reviewed  09/12/2022  - Note at rest 02 sats  96% on RA   General appearance:    amb wf nad    HEENT : Oropharynx  clear      Nasal turbinates nl    NECK :   without  apparent JVD/ palpable Nodes/TM    LUNGS: no acc muscle use,  Nl contour chest which is clear to A and P bilaterally without cough on insp or exp maneuvers   CV:  RRR  no s3 or murmur or increase in P2, and no edema   ABD:  soft and nontender with nl inspiratory excursion in the supine position. No bruits or organomegaly appreciated   MS:  Nl gait/ ext warm without deformities Or obvious joint restrictions  calf tenderness, cyanosis or clubbing    SKIN: warm and dry without lesions    NEURO:  alert, approp, nl sensorium with  no motor or cerebellar deficits apparent.         Assessment:

## 2022-09-13 ENCOUNTER — Encounter: Payer: Self-pay | Admitting: Internal Medicine

## 2022-09-13 DIAGNOSIS — Z1231 Encounter for screening mammogram for malignant neoplasm of breast: Secondary | ICD-10-CM | POA: Diagnosis not present

## 2022-09-13 NOTE — Assessment & Plan Note (Signed)
Allergy profile 01/24/2017 >  Eos 0.3 /  IgE  702  Dog > cat,Trees/grass/ ragweed    - 01/24/2017  continue symb 80 2bid  - Sinus CT 01/24/2017 >>> No evidence of sinusitis.  - Spirometry 04/15/2017  FEV1 2.11 (100%)  Ratio 80 with nl contour in effort indep portion  - 08/01/2022  After extensive coaching inhaler device,  effectiveness =    90% increase symb 80 to 2 bid and max rx for gerd x 6 weeks then return to regroup - Allergy screen 08/01/2022 >  Eos 0.1 /  IgE  125 - 09/12/2022  After extensive coaching inhaler device,  effectiveness =    90%   All goals of chronic asthma control met including optimal function and elimination of symptoms with minimal need for rescue therapy.  Contingencies discussed in full including contacting this office immediately if not controlling the symptoms using the rule of two's.     Most of her sym;ptoms at this point are upper airway in nature so reviewed use of otc's and f/u with Dr Ernst Bowler  We can see her yearly, sooner prn          Each maintenance medication was reviewed in detail including emphasizing most importantly the difference between maintenance and prns and under what circumstances the prns are to be triggered using an action plan format where appropriate.  Total time for H and P, chart review, counseling, reviewing hfa  device(s) and generating customized AVS unique to this office visit / same day charting = 23 min

## 2022-09-16 DIAGNOSIS — E039 Hypothyroidism, unspecified: Secondary | ICD-10-CM | POA: Diagnosis not present

## 2022-09-17 ENCOUNTER — Encounter: Payer: Self-pay | Admitting: Adult Health

## 2022-09-17 LAB — TSH: TSH: 3.14 u[IU]/mL (ref 0.450–4.500)

## 2022-09-17 LAB — T4, FREE: Free T4: 1.23 ng/dL (ref 0.82–1.77)

## 2022-09-18 ENCOUNTER — Other Ambulatory Visit (HOSPITAL_COMMUNITY): Payer: Self-pay | Admitting: Family Medicine

## 2022-09-18 DIAGNOSIS — R093 Abnormal sputum: Secondary | ICD-10-CM

## 2022-09-18 DIAGNOSIS — R42 Dizziness and giddiness: Secondary | ICD-10-CM | POA: Diagnosis not present

## 2022-09-20 ENCOUNTER — Ambulatory Visit (HOSPITAL_COMMUNITY)
Admission: RE | Admit: 2022-09-20 | Discharge: 2022-09-20 | Disposition: A | Discharge status: Home / Self Care | Payer: Medicare HMO | Source: Ambulatory Visit | Attending: Family Medicine | Admitting: Family Medicine

## 2022-09-20 DIAGNOSIS — R093 Abnormal sputum: Secondary | ICD-10-CM | POA: Diagnosis not present

## 2022-09-20 DIAGNOSIS — R059 Cough, unspecified: Secondary | ICD-10-CM | POA: Diagnosis not present

## 2022-09-24 NOTE — Patient Instructions (Signed)

## 2022-09-25 ENCOUNTER — Encounter: Payer: Self-pay | Admitting: Nurse Practitioner

## 2022-09-25 ENCOUNTER — Ambulatory Visit: Payer: Medicare HMO | Admitting: Nurse Practitioner

## 2022-09-25 VITALS — BP 144/80 | HR 93 | Ht 64.0 in | Wt 155.0 lb

## 2022-09-25 DIAGNOSIS — E039 Hypothyroidism, unspecified: Secondary | ICD-10-CM | POA: Diagnosis not present

## 2022-09-25 NOTE — Progress Notes (Signed)
Endocrinology Follow Up Visit                                        09/25/2022, 11:40 AM     SUBJECTIVE:  [name] is a 81 y.o.-year-old female patient being seen in follow up after being seen in consultation for hypothyroidism referred by [name], MD.   Past Medical History:  Diagnosis Date   Anemia    Anxiety    Arthritis    Asthma    Constipation    GERD (gastroesophageal reflux disease)    High blood pressure    History of COVID-19 12/28/2020   Hypothyroidism    PAD (peripheral artery disease) (HCC)    bilateral legs   Peripheral edema    Peripheral neuropathy    bilateral feet at night occ   Psoriasis    Seasonal allergies    Skin cancer    Left eye brow   Varicose veins    Vertigo 11/23/2013    Past Surgical History:  Procedure Laterality Date   APPENDECTOMY     patient unsure   BIOPSY  12/21/2020   Procedure: BIOPSY;  Surgeon: Daneil Dolin, MD;  Location: [facility];  Service: Endoscopy;;   BIOPSY  05/28/2021   Procedure: BIOPSY;  Surgeon: Eloise Harman, DO;  Location: [facility];  Service: Endoscopy;;   CATARACT EXTRACTION W/PHACO Left 01/27/2017   Procedure: CATARACT EXTRACTION PHACO AND INTRAOCULAR LENS PLACEMENT ([place]);  Surgeon: Tonny Branch, MD;  Location: [facility];  Service: Ophthalmology;  Laterality: Left;  CDE: 29.90   CATARACT EXTRACTION W/PHACO Right 02/24/2017   Procedure: CATARACT EXTRACTION PHACO AND INTRAOCULAR LENS PLACEMENT ([facility]);  Surgeon: Tonny Branch, MD;  Location: [facility];  Service: Ophthalmology;  Laterality: Right;  CDE: 8.57   CHOLECYSTECTOMY     COLONOSCOPY N/A 10/01/2016   Procedure: COLONOSCOPY;  Surgeon: Daneil Dolin, MD; pancolonic diverticulosis, melanosis coli, otherwise normal exam.  No recommendations to repeat colonoscopy.   COLONOSCOPY WITH PROPOFOL N/A 12/21/2020   Rourk: Diverticulosis   ESOPHAGOGASTRODUODENOSCOPY (EGD) WITH PROPOFOL  N/A 12/21/2020   Rourk: Erosive reflux esophagitis, nonbleeding gastric ulcers with biopsy showing erosion/ulceration, negative for H. pylori.   ESOPHAGOGASTRODUODENOSCOPY (EGD) WITH PROPOFOL N/A 05/28/2021   Carver: gastritis (reactive gastropathy), no H.pylori   FLEXOR TENDON REPAIR Right 11/06/2017   Procedure: RIGHT WRIST FLEXOR TENDON REPAIRAND STT DEBRIEDMENT;  Surgeon: Leanora Cover, MD;  Location: [facility];  Service: Orthopedics;  Laterality: Right;   MOUTH SURGERY     artery bleed   TOTAL HIP ARTHROPLASTY Right 01/31/2021   Procedure: TOTAL HIP ARTHROPLASTY ANTERIOR APPROACH;  Surgeon: Rod Can, MD;  Location: [facility];  Service: Orthopedics;  Laterality: Right;   TUBAL LIGATION      Social History   Socioeconomic History   Marital status: Married    Spouse name: Not on file   Number of children: Not on file   Years of education: Not on file   Highest education level: Not on file  Occupational History  Not on file  Tobacco Use   Smoking status: Former    Packs/day: 0.25    Years: 5.00    Total pack years: 1.25    Types: Cigarettes    Quit date: 12/02/1997    Years since quitting: 24.8    Passive exposure: Past   Smokeless tobacco: Never  Vaping Use   Vaping Use: Never used  Substance and Sexual Activity   Alcohol use: Not Currently    Alcohol/week: 1.0 standard drink of alcohol    Types: 1 Glasses of wine per week   Drug use: No   Sexual activity: Not Currently    Birth control/protection: Surgical    Comment: tubal  Other Topics Concern   Not on file  Social History Narrative   Not on file   Social Determinants of Health   Financial Resource Strain: Low Risk  (04/10/2020)   Overall Financial Resource Strain (CARDIA)    Difficulty of Paying Living Expenses: Not hard at all  Food Insecurity: No Food Insecurity (05/31/2021)   Hunger Vital Sign    Worried About Running Out of Food in the Last Year: Never true    Ran Out of Food in the  Last Year: Never true  Transportation Needs: No Transportation Needs (02/19/2022)   PRAPARE - Hydrologist (Medical): No    Lack of Transportation (Non-Medical): No  Physical Activity: Inactive (04/10/2020)   Exercise Vital Sign    Days of Exercise per Week: 0 days    Minutes of Exercise per Session: 0 min  Stress: Stress Concern Present (04/10/2020)   [place]    Feeling of Stress : To some extent  Social Connections: Socially Integrated (04/10/2020)   Social Connection and Isolation Panel [NHANES]    Frequency of Communication with Friends and Family: More than three times a week    Frequency of Social Gatherings with Friends and Family: More than three times a week    Attends Religious Services: More than 4 times per year    Active Member of [employer] or Organizations: Yes    Attends Music therapist: More than 4 times per year    Marital Status: Married    Family History  Problem Relation Age of Onset   Hypertension Mother    Varicose Veins Father    Heart attack Father    Heart disease Father    Hypertension Brother    COPD Brother     Outpatient Encounter Medications as of 09/25/2022  Medication Sig   acetaminophen (TYLENOL) 500 MG tablet Take 500-1,000 mg by mouth as needed for mild pain or moderate pain.   albuterol (VENTOLIN HFA) 108 (90 Base) MCG/ACT inhaler Inhale 2 puffs into the lungs every 6 (six) hours as needed for wheezing or shortness of breath.   ALPRAZolam (XANAX) 0.5 MG tablet Take 0.5 mg by mouth in the morning, at noon, and at bedtime. (1000, 1500, AT BEDTIME)   amLODipine (NORVASC) 5 MG tablet Take 5 mg by mouth 2 (two) times daily.   aspirin EC 81 MG tablet Take 81 mg by mouth daily. Swallow whole.   azelastine (ASTELIN) 0.1 % nasal spray Place into both nostrils.   cetirizine (ZYRTEC) 5 MG tablet    Cholecalciferol (VITAMIN D3) 50 MCG (2000 UT)  capsule Take 2,000 Units by mouth daily.   diclofenac Sodium (VOLTAREN) 1 % GEL Apply 1 application topically as needed (back pain).   famotidine (  PEPCID) 20 MG tablet One after supper   levothyroxine (SYNTHROID) 50 MCG tablet Take 1 tablet (50 mcg total) by mouth daily before breakfast.   losartan (COZAAR) 50 MG tablet Take 50 mg by mouth 2 (two) times daily.   Multiple Vitamin (MULTIVITAMIN WITH MINERALS) TABS tablet Take 1 tablet by mouth daily in the afternoon.   mupirocin ointment (BACTROBAN) 2 % APPLY TO AFFECTED AREA DAILY   pantoprazole (PROTONIX) 40 MG tablet TAKE 1 TABLET BY MOUTH ONCE A DAY.   polyethylene glycol (MIRALAX / GLYCOLAX) 17 g packet Take 17 g by mouth daily.   raloxifene (EVISTA) 60 MG tablet Take 1 tablet (60 mg total) by mouth every morning. (Patient taking differently: Take 60 mg by mouth every other day. IN THE MORNING)   SYMBICORT 80-4.5 MCG/ACT inhaler INHALE 2 PUFFS INTO THE LUNGS TWICE DAILY. (Patient taking differently: Inhale 2 puffs into the lungs daily.)   venlafaxine XR (EFFEXOR-XR) 75 MG 24 hr capsule venlafaxine ER 75 mg capsule,extended release 24 hr   No facility-administered encounter medications on file as of 09/25/2022.    ALLERGIES: Allergies  Allergen Reactions   Amoxicillin-Pot Clavulanate Nausea And Vomiting, Other (See Comments), Itching and Nausea Only    Has patient had a PCN reaction causing immediate rash, facial/tongue/throat swelling, SOB or lightheadedness with hypotension: No Has patient had a PCN reaction causing severe rash involving mucus membranes or skin necrosis: No Has patient had a PCN reaction that required hospitalization: No Has patient had a PCN reaction occurring within the last 10 years: No If all of the above answers are "NO", then may proceed with Cephalosporin use. Has patient had a PCN reaction causing immediate rash, facial/tongue/throat swelling, SOB or lightheadedness with hypotension: No Has patient had a PCN  reaction causing severe rash involving mucus membranes or skin necrosis: No Has patient had a PCN reaction that required hospitalization: No Has patient had a PCN reaction occurring within the last 10 years: No If all of the above answers are "NO", then may proceed with Cephalosporin use.    Ace Inhibitors Cough   Guaifenesin Hives   Lisinopril Cough and Other (See Comments)   Cetirizine    Entex La Itching   Gabapentin     Other reaction(s): Unknown   Levofloxacin Hives and Itching   Meloxicam Other (See Comments)    (MOBIC) Unknown reaction type (maybe nausea?)   VACCINATION STATUS: Immunization History  Administered Date(s) Administered   Influenza Whole 09/01/2016   Influenza, High Dose Seasonal PF 09/11/2018, 11/02/2019, 09/20/2021   Influenza-Unspecified 08/02/2013, 08/29/2015   PFIZER(Purple Top)SARS-COV-2 Vaccination 12/16/2019, 01/17/2020, 09/28/2020   Pneumococcal Conjugate-13 08/31/2019   Zoster Recombinat (Shingrix) 08/31/2019     HPI  Thyroid Problem Presents for follow-up visit. Symptoms include anxiety. Patient reports no cold intolerance, constipation, depressed mood, fatigue, heat intolerance, palpitations, tremors, weight gain or weight loss. The symptoms have been improving.    [name]  is a patient with the above medical history.  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    I reviewed patient's thyroid tests:  Lab Results  Component Value Date   TSH 3.140 09/16/2022   TSH 1.740 03/18/2022   TSH 2.670 09/19/2021   TSH 2.040 05/14/2021   TSH 6.350 (H) 02/08/2021   TSH 2.39 09/28/2020   TSH 0.60 07/25/2020   FREET4 1.23 09/16/2022   FREET4 1.38 03/18/2022   FREET4 1.09 09/19/2021   FREET4 1.36 05/14/2021   FREET4 1.21  02/08/2021   FREET4 1.4 09/28/2020     Pt denies feeling nodules in neck, hoarseness, dysphagia/odynophagia, SOB with lying down.  she has No history of radiation therapy to head or neck.   No recent use of iodine supplements.  I reviewed her chart and she also has a history of GERD with ulcers, depression/anxiety, anemia, asthma, and high blood pressure.   Review of systems  Constitutional: + steadily increasing body weight,  current Body mass index is 26.61 kg/m. , + fatigue, no subjective hyperthermia, no subjective hypothermia Eyes: no blurry vision, no xerophthalmia ENT: no sore throat, no nodules palpated in throat, no dysphagia/odynophagia, no hoarseness Cardiovascular: no chest pain, no shortness of breath, no palpitations, no leg swelling Respiratory: no cough, no shortness of breath Gastrointestinal: no nausea/vomiting/diarrhea Musculoskeletal: no muscle/joint aches Skin: no rashes, no hyperemia Neurological: no tremors, no numbness, no tingling, no dizziness Psychiatric: no depression, no anxiety, has significant amount of stress caring for her husband with Alzheimers  ------------------------------------------------------------------------------------------------------------------ OBJECTIVE:  BP (!) 144/80   Pulse 93   Ht '5\' 4"'$  (1.626 m)   Wt 155 lb (70.3 kg)   BMI 26.61 kg/m  Wt Readings from Last 3 Encounters:  09/25/22 155 lb (70.3 kg)  09/12/22 152 lb 3.2 oz (69 kg)  08/01/22 149 lb 12.8 oz (67.9 kg)   BP Readings from Last 3 Encounters:  09/25/22 (!) 144/80  09/12/22 128/88  08/01/22 126/78     Physical Exam- Limited  Constitutional:  Body mass index is 26.61 kg/m. , not in acute distress, normal state of mind Eyes:  EOMI, no exophthalmos Neck: Supple Cardiovascular: RRR, no murmurs, rubs, or gallops, no edema Respiratory: Adequate breathing efforts, no crackles, rales, rhonchi, or wheezing Musculoskeletal: no gross deformities, strength intact in all four extremities, no gross restriction of joint movements Skin:  no rashes, no hyperemia Neurological: no tremor with outstretched hands   CMP ( most recent) CMP     Component Value  Date/Time   NA 139 03/15/2021 1220   NA 141 09/24/2018 1204   K 3.3 (L) 03/15/2021 1220   CL 102 03/15/2021 1220   CO2 25 03/15/2021 1220   GLUCOSE 88 03/15/2021 1220   BUN 10 03/15/2021 1220   BUN 13 09/24/2018 1204   CREATININE 0.66 03/15/2021 1220   CREATININE 0.94 (H) 01/22/2021 1419   CALCIUM 8.8 (L) 03/15/2021 1220   PROT 6.6 03/15/2021 1220   PROT 6.5 09/24/2018 1204   ALBUMIN 3.8 03/15/2021 1220   ALBUMIN 4.3 09/24/2018 1204   AST 24 03/15/2021 1220   ALT 15 03/15/2021 1220   ALKPHOS 69 03/15/2021 1220   BILITOT 0.4 03/15/2021 1220   BILITOT 0.4 09/24/2018 1204   GFRNONAA >60 03/15/2021 1220   GFRAA >60 05/08/2020 1232     Diabetic Labs (most recent): No results found for: "HGBA1C", "MICROALBUR"   Lipid Panel ( most recent) Lipid Panel     Component Value Date/Time   TRIG 92 09/11/2020 1547       Lab Results  Component Value Date   TSH 3.140 09/16/2022   TSH 1.740 03/18/2022   TSH 2.670 09/19/2021   TSH 2.040 05/14/2021   TSH 6.350 (H) 02/08/2021   TSH 2.39 09/28/2020   TSH 0.60 07/25/2020   FREET4 1.23 09/16/2022   FREET4 1.38 03/18/2022   FREET4 1.09 09/19/2021   FREET4 1.36 05/14/2021   FREET4 1.21 02/08/2021   FREET4 1.4 09/28/2020      Latest Reference Range & Units 02/08/21  14:10 05/14/21 12:22 09/19/21 14:46 03/18/22 13:52 09/16/22 15:20  TSH 0.450 - 4.500 uIU/mL 6.350 (H) 2.040 2.670 1.740 3.140  T4,Free(Direct) 0.82 - 1.77 ng/dL 1.21 1.36 1.09 1.38 1.23  (H): Data is abnormally high --------------------------------------------------------------------------------------------------------------------   ASSESSMENT / PLAN: 1. Hypothyroidism- acquired   Patient with long-standing hypothyroidism, on levothyroxine therapy. On physical exam, patient does not have gross goiter, thyroid nodules, or neck compression symptoms.  Thyroid US WNL for age.  -Her previsit thyroid function tests are consistent with appropriate hormone replacement.  She  is advised to continue Levothyroxine 50 mcg po daily before breakfast.   - We discussed about correct intake of levothyroxine, at fasting, with water, separated by at least 30 minutes from breakfast. -Patient is made aware of the fact that thyroid hormone replacement is needed for life, dose to be adjusted by periodic monitoring of thyroid function tests.    I spent 18 minutes in the care of the patient today including review of labs from Thyroid Function, CMP, and other relevant labs ; imaging/biopsy records (current and previous including abstractions from other facilities); face-to-face time discussing  her lab results and symptoms, medications doses, her options of short and long term treatment based on the latest standards of care / guidelines;   and documenting the encounter.  [name]  participated in the discussions, expressed understanding, and voiced agreement with the above plans.  All questions were answered to her satisfaction. she is encouraged to contact clinic should she have any questions or concerns prior to her return visit.   Follow Up Plan: Return in about 6 months (around 03/27/2023) for Thyroid follow up, Previsit labs.    Rayetta Pigg, [facility] [address] [place], [place] [address] Phone: [phone] Fax: [phone]  09/25/2022, 11:40 AM

## 2022-09-26 ENCOUNTER — Telehealth: Payer: Self-pay | Admitting: Internal Medicine

## 2022-09-26 DIAGNOSIS — J45991 Cough variant asthma: Secondary | ICD-10-CM

## 2022-09-26 NOTE — Telephone Encounter (Addendum)
Called and notified patient that she already has an appointment with Allergy and Asthma. She states she thought the doctor she was seeing was a PCP. Confirmed for her that Dr. Posey Pronto with allergy and asthma is a specialist and she voiced understanding. Nothing further needed for now.

## 2022-09-28 ENCOUNTER — Ambulatory Visit
Admission: EM | Admit: 2022-09-28 | Discharge: 2022-09-28 | Disposition: A | Discharge status: Home / Self Care | Payer: Medicare HMO | Attending: Nurse Practitioner | Admitting: Nurse Practitioner

## 2022-09-28 DIAGNOSIS — R0982 Postnasal drip: Secondary | ICD-10-CM

## 2022-09-28 DIAGNOSIS — J309 Allergic rhinitis, unspecified: Secondary | ICD-10-CM

## 2022-09-28 MED ORDER — LIDOCAINE VISCOUS HCL 2 % MT SOLN: 5.0000 mL | OROMUCOSAL | 0 refills | Status: DC | PRN

## 2022-09-28 NOTE — ED Triage Notes (Signed)
Pt reports sore throat headache and allegies for 2 months  Primary dr gave anitbiotics said everyone had allegeries so you have allergies.   Took coricidin and tylenol every 6 hrs provides slight relief Gargles with salt and warm water

## 2022-09-28 NOTE — ED Provider Notes (Signed)
[facility]    CSN: [id number] Arrival date & time: 09/28/22  1122      History   Chief Complaint No chief complaint on file.   HPI [name] is a 81 y.o. female.   The history is provided by the patient.   Patient presents with a 43-monthhistory of sore throat and cough.  Patient states that she has been told by her primary care physician and by her pulmonologist that she has seasonal allergies.  She states that initially her primary care physician prescribed antibiotics and prednisone, but she states that did not help her symptoms.  She states that she then went to see her pulmonologist who told her to stop the antibiotic.  Patient states that her primary care has since made a referral for her to see an allergist, but appointment is not until mid November.  Patient states throat pain is worse in the morning along with the coughing.  She states that it improves throughout the day and then worsens at night.  She denies fever, chills, headache, nasal congestion, wheezing, shortness of breath, or difficulty breathing.  Patient states she has been taking Coricidin, Tylenol, and using warm salt water gargles for her symptoms.  Patient reports she is unable to take a lot of different medications due to her blood pressure.  Past Medical History:  Diagnosis Date   Anemia    Anxiety    Arthritis    Asthma    Constipation    GERD (gastroesophageal reflux disease)    High blood pressure    History of COVID-19 12/28/2020   Hypothyroidism    PAD (peripheral artery disease) (HCC)    bilateral legs   Peripheral edema    Peripheral neuropathy    bilateral feet at night occ   Psoriasis    Seasonal allergies    Skin cancer    Left eye brow   Varicose veins    Vertigo 11/23/2013    Patient Active Problem List   Diagnosis Date Noted   Gastritis and gastroduodenitis 06/14/2021   Gastric ulcer 04/13/2021   Abdominal pain 03/15/2021   [place] 01[place]   Closed displaced [place] of right femoral neck [place] 01/31/2021   Indigestion 11/02/2020   Change in stool caliber 11/02/2020   Loss of weight 08/23/2020   Nausea without vomiting 06/15/2020   Tick bite 04/10/2020   Medication refill 04/10/2020   Acquired trigger finger 03/28/2020   Pain in right knee 10/08/2019   Acute maxillary sinusitis 08/20/2019   Cellulitis, leg 08/10/2019   Cellulitis 08/08/2019   Fever 08/08/2019   Cellulitis of foot 08/08/2019   Hypokalemia 08/08/2019   Hyponatremia 08/08/2019   Essential hypertension 08/08/2019   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   Adverse food reaction 09/24/2018   Mild persistent asthma without complication 181/44/8185  Pain of left hip joint 07/17/2018   Dyspnea on exertion 01/24/2017   Cough variant asthma  vs UACS 01/24/2017   High risk medication use 09/25/2016   Varicose veins of lower extremities with other complications 063/14/9702  Varicose veins 02/15/2014   Vertigo 11/23/2013   Pain in limb 10/04/2013   Hip bursitis 02/11/2013   Constipation 02/16/2009   RECTAL BLEEDING 02/16/2009   HIGH BLOOD PRESSURE 01/29/2008    Past Surgical History:  Procedure Laterality Date   APPENDECTOMY     patient unsure   BIOPSY  12/21/2020   Procedure: BIOPSY;  Surgeon: RDaneil Dolin MD;  [facility];  Service: Endoscopy;;   BIOPSY  05/28/2021   Procedure: BIOPSY;  Surgeon: Eloise Harman, DO;  Location: [facility];  Service: Endoscopy;;   CATARACT EXTRACTION W/PHACO Left 01/27/2017   Procedure: CATARACT EXTRACTION PHACO AND INTRAOCULAR LENS PLACEMENT (IOC);  Surgeon: Tonny Branch, MD;  Location: [facility];  Service: Ophthalmology;  Laterality: Left;  CDE: 29.90   CATARACT EXTRACTION W/PHACO Right 02/24/2017   Procedure: CATARACT EXTRACTION PHACO AND INTRAOCULAR LENS PLACEMENT (IOC);  Surgeon: Tonny Branch, MD;  Location: [facility];  Service: Ophthalmology;  Laterality: Right;  CDE: 8.57    CHOLECYSTECTOMY     COLONOSCOPY N/A 10/01/2016   Procedure: COLONOSCOPY;  Surgeon: Daneil Dolin, MD; pancolonic diverticulosis, melanosis coli, otherwise normal exam.  No recommendations to repeat colonoscopy.   COLONOSCOPY WITH PROPOFOL N/A 12/21/2020   Rourk: Diverticulosis   ESOPHAGOGASTRODUODENOSCOPY (EGD) WITH PROPOFOL N/A 12/21/2020   Rourk: Erosive reflux esophagitis, nonbleeding gastric ulcers with biopsy showing erosion/ulceration, negative for H. pylori.   ESOPHAGOGASTRODUODENOSCOPY (EGD) WITH PROPOFOL N/A 05/28/2021   Carver: gastritis (reactive gastropathy), no H.pylori   FLEXOR TENDON REPAIR Right 11/06/2017   Procedure: RIGHT WRIST FLEXOR TENDON REPAIRAND STT DEBRIEDMENT;  Surgeon: Leanora Cover, MD;  Location: [place];  Service: Orthopedics;  Laterality: Right;   MOUTH SURGERY     artery bleed   TOTAL HIP ARTHROPLASTY Right 01/31/2021   Procedure: TOTAL HIP ARTHROPLASTY ANTERIOR APPROACH;  Surgeon: Rod Can, MD;  Location: [facility];  Service: Orthopedics;  Laterality: Right;   TUBAL LIGATION      OB History     Gravida  2   Para  2   Term      Preterm      AB      Living  2      SAB      IAB      Ectopic      Multiple      Live Births  2            Home Medications    Prior to Admission medications   Medication Sig Start Date End Date Taking? Authorizing Provider  lidocaine (XYLOCAINE) 2 % solution Use as directed 5 mLs in the mouth or throat as needed for mouth pain. Gargle and spit 15m up to three times daily as needed for throat pain. 09/28/22  Yes Charlene Detter-Warren, CAlda Lea NP  acetaminophen (TYLENOL) 500 MG tablet Take 500-1,000 mg by mouth as needed for mild pain or moderate pain.    [provider]  albuterol (VENTOLIN HFA) 108 (90 Base) MCG/ACT inhaler Inhale 2 puffs into the lungs every 6 (six) hours as needed for wheezing or shortness of breath.    [provider]  ALPRAZolam (XANAX) 0.5 MG  tablet Take 0.5 mg by mouth in the morning, at noon, and at bedtime. (1000, 1500, AT BEDTIME)    [provider]  amLODipine (NORVASC) 5 MG tablet Take 5 mg by mouth 2 (two) times daily. 02/09/13   [provider]  aspirin EC 81 MG tablet Take 81 mg by mouth daily. Swallow whole.    [provider]  azelastine (ASTELIN) 0.1 % nasal spray Place into both nostrils. 01/26/22   [provider]  cetirizine (ZYRTEC) 5 MG tablet     [provider]  Cholecalciferol (VITAMIN D3) 50 MCG (2000 UT) capsule Take 2,000 Units by mouth daily.    [provider]  diclofenac Sodium (VOLTAREN) 1 %  GEL Apply 1 application topically as needed (back pain).    [provider]  famotidine (PEPCID) 20 MG tablet One after supper 08/01/22   Tanda Rockers, MD  levothyroxine (SYNTHROID) 50 MCG tablet Take 1 tablet (50 mcg total) by mouth daily before breakfast. 03/26/22   Brita Romp, NP  losartan (COZAAR) 50 MG tablet Take 50 mg by mouth 2 (two) times daily. 07/14/20   [provider]  Multiple Vitamin (MULTIVITAMIN WITH MINERALS) TABS tablet Take 1 tablet by mouth daily in the afternoon.    [provider]  mupirocin ointment (BACTROBAN) 2 % APPLY TO AFFECTED AREA DAILY 03/07/22   Trula Slade, DPM  pantoprazole (PROTONIX) 40 MG tablet TAKE 1 TABLET BY MOUTH ONCE A DAY. 06/19/22   Mahala Menghini, PA-C  polyethylene glycol (MIRALAX / GLYCOLAX) 17 g packet Take 17 g by mouth daily.    [provider]  raloxifene (EVISTA) 60 MG tablet Take 1 tablet (60 mg total) by mouth every morning. Patient taking differently: Take 60 mg by mouth every other day. IN THE MORNING 04/10/20   Estill Dooms, NP  SYMBICORT 80-4.5 MCG/ACT inhaler INHALE 2 PUFFS INTO THE LUNGS TWICE DAILY. Patient taking differently: Inhale 2 puffs into the lungs daily. 02/02/18   Tanda Rockers, MD  venlafaxine XR (EFFEXOR-XR) 75 MG 24 hr capsule venlafaxine ER 75  mg capsule,extended release 24 hr    [provider]    Family History Family History  Problem Relation Age of Onset   Hypertension Mother    Varicose Veins Father    Heart attack Father    Heart disease Father    Hypertension Brother    COPD Brother     Social History Social History   Tobacco Use   Smoking status: Former    Packs/day: 0.25    Years: 5.00    Total pack years: 1.25    Types: Cigarettes    Quit date: 12/02/1997    Years since quitting: 24.8    Passive exposure: Past   Smokeless tobacco: Never  Vaping Use   Vaping Use: Never used  Substance Use Topics   Alcohol use: Not Currently    Alcohol/week: 1.0 standard drink of alcohol    Types: 1 Glasses of wine per week   Drug use: No     Allergies   Amoxicillin-pot clavulanate, Ace inhibitors, Guaifenesin, Lisinopril, Cetirizine, Entex la, Gabapentin, Levofloxacin, and Meloxicam   Review of Systems Review of Systems Per HPI  Physical Exam Triage Vital Signs ED Triage Vitals  Enc Vitals Group     BP 09/28/22 1128 (!) 148/82     Pulse Rate 09/28/22 1128 96     Resp 09/28/22 1128 18     Temp 09/28/22 1128 98.4 F (36.9 C)     Temp Source 09/28/22 1128 Oral     SpO2 09/28/22 1128 97 %     Weight --      Height --      Head Circumference --      Peak Flow --      Pain Score 09/28/22 1133 6     Pain Loc --      Pain Edu? --      Excl. in [place]? --    No data found.  Updated Vital Signs BP (!) 148/82 (BP Location: Right Arm)   Pulse 96   Temp 98.4 F (36.9 C) (Oral)   Resp 18   SpO2 97%  Visual Acuity Right Eye Distance:   Left Eye Distance:   Bilateral Distance:    Right Eye Near:   Left Eye Near:    Bilateral Near:     Physical Exam Vitals and nursing note reviewed.  Constitutional:      General: She is not in acute distress.    Appearance: Normal appearance.  HENT:     Head: Normocephalic.     Right Ear: Tympanic membrane, ear canal and external ear normal.     Left  Ear: Tympanic membrane, ear canal and external ear normal.     Nose: Nose normal.     Right Turbinates: Enlarged and swollen.     Left Turbinates: Enlarged and swollen.     Right Sinus: No maxillary sinus tenderness or frontal sinus tenderness.     Left Sinus: No maxillary sinus tenderness or frontal sinus tenderness.     Mouth/Throat:     Lips: Pink.     Mouth: Mucous membranes are moist.     Pharynx: Posterior oropharyngeal erythema present. No pharyngeal swelling.     Comments: + PND present Eyes:     Extraocular Movements: Extraocular movements intact.     Conjunctiva/sclera: Conjunctivae normal.     Pupils: Pupils are equal, round, and reactive to light.  Cardiovascular:     Rate and Rhythm: Normal rate and regular rhythm.     Pulses: Normal pulses.     Heart sounds: Normal heart sounds.  Pulmonary:     Effort: Pulmonary effort is normal.     Breath sounds: Normal breath sounds.  Abdominal:     General: Bowel sounds are normal.     Palpations: Abdomen is soft.     Tenderness: There is no abdominal tenderness.  Musculoskeletal:     Cervical back: Normal range of motion.  Lymphadenopathy:     Cervical: No cervical adenopathy.  Skin:    General: Skin is warm and dry.  Neurological:     General: No focal deficit present.     Mental Status: She is alert and oriented to person, place, and time.  Psychiatric:        Mood and Affect: Mood normal.        Behavior: Behavior normal.      UC Treatments / Results  Labs (all labs ordered are listed, but only abnormal results are displayed) Labs Reviewed - No data to display  EKG   Radiology No results found.  Procedures Procedures (including critical care time)  Medications Ordered in UC Medications - No data to display  Initial Impression / Assessment and Plan / UC Course  I have reviewed the triage vital signs and the nursing notes.  Pertinent labs & imaging results that were available during my care of the  patient were reviewed by me and considered in my medical decision making (see chart for details).  Patient is a very pleasant well-appearing female who presents for complaints of sore throat and cough.  Symptoms have been persistent for the past 2 months.  On exam, patient has postnasal drainage, which is likely irritating her throat.  Symptoms are consistent with allergic rhinitis.  Patient is hesitant to start any new medications due to her blood pressure.  Patient was prescribed viscous lidocaine 2% to help with throat pain or discomfort.  Patient was advised to continue her current medications at this time.  Patient was advised to contact the allergist office to see if they have any cancellations to get her in for  a sooner appointment.  Patient was advised to follow-up with her primary care physician in the interim if symptoms do not improve.  Patient verbalizes understanding.  All questions were answered.  Patient is stable for discharge. Final Clinical Impressions(s) / UC Diagnoses   Final diagnoses:  Allergic rhinitis with postnasal drip     Discharge Instructions      Take medication as directed. Continue your current allergy medication regimen. Increase fluids and get plenty of rest. Continue OTCTylenol as needed for pain, fever, or general discomfort. Recommend normal saline nasal spray to help with nasal congestion throughout the day. For your cough, it may be helpful to use a humidifier at bedtime during sleep. Keep appointment with allergist in November.      ED Prescriptions     Medication Sig Dispense Auth. Provider   lidocaine (XYLOCAINE) 2 % solution Use as directed 5 mLs in the mouth or throat as needed for mouth pain. Gargle and spit 65m up to three times daily as needed for throat pain. 100 mL Ethen Bannan-Warren, CAlda Lea NP      PDMP not reviewed this encounter.   LTish Men NP 09/28/22 [phone]

## 2022-09-28 NOTE — Discharge Instructions (Addendum)
Take medication as directed. Continue your current allergy medication regimen. Increase fluids and get plenty of rest. Continue OTCTylenol as needed for pain, fever, or general discomfort. Recommend normal saline nasal spray to help with nasal congestion throughout the day. For your cough, it may be helpful to use a humidifier at bedtime during sleep. Keep appointment with allergist in November.

## 2022-10-01 ENCOUNTER — Telehealth: Payer: Self-pay | Admitting: Emergency Medicine

## 2022-10-01 ENCOUNTER — Other Ambulatory Visit: Payer: Self-pay

## 2022-10-01 ENCOUNTER — Ambulatory Visit
Admission: EM | Admit: 2022-10-01 | Discharge: 2022-10-01 | Disposition: A | Discharge status: Home / Self Care | Payer: Medicare HMO | Attending: Nurse Practitioner | Admitting: Nurse Practitioner

## 2022-10-01 ENCOUNTER — Encounter: Payer: Self-pay | Admitting: Emergency Medicine

## 2022-10-01 DIAGNOSIS — J029 Acute pharyngitis, unspecified: Secondary | ICD-10-CM | POA: Insufficient documentation

## 2022-10-01 DIAGNOSIS — R0982 Postnasal drip: Secondary | ICD-10-CM | POA: Insufficient documentation

## 2022-10-01 DIAGNOSIS — Z1152 Encounter for screening for COVID-19: Secondary | ICD-10-CM | POA: Diagnosis not present

## 2022-10-01 LAB — POCT RAPID STREP A (OFFICE): Rapid Strep A Screen: NEGATIVE

## 2022-10-01 NOTE — ED Provider Notes (Signed)
[facility]    CSN: [id number] Arrival date & time: 10/01/22  1147      History   Chief Complaint Chief Complaint  Patient presents with   Sore Throat    HPI [name] is a 81 y.o. female.   Patient presents for more than 2 weeks of sore throat.  Reports she has been seen by 3 providers who have told her that this is postnasal drainage and allergic rhinitis.  She is taking medication as prescribed and it helps temporarily.  She also endorses congested cough with thick productive mucus, nasal congestion, runny nose, sneezing, headache that has been going on for the same amount of time if not longer.  She denies fever, chest pain, shortness of breath, chest tightness, ear pain or drainage, abdominal pain, nausea/vomiting, diarrhea, decreased appetite, loss of taste or smell, new rash, and fatigue.  Reports last time she had this severe of a sore throat, she tested positive for COVID-19.  Reports she had to have allergy shots 30+ years ago, however allergies lessened so she stopped the shots.  She has an appointment coming up with an allergist in a couple of weeks.  She has been taking oral antihistamine, azelastine spray, lidocaine rinses which has helped with sore throat.     Past Medical History:  Diagnosis Date   Anemia    Anxiety    Arthritis    Asthma    Constipation    GERD (gastroesophageal reflux disease)    High blood pressure    History of COVID-19 12/28/2020   Hypothyroidism    PAD (peripheral artery disease) (HCC)    bilateral legs   Peripheral edema    Peripheral neuropathy    bilateral feet at night occ   Psoriasis    Seasonal allergies    Skin cancer    Left eye brow   Varicose veins    Vertigo 11/23/2013    Patient Active Problem List   Diagnosis Date Noted   Gastritis and gastroduodenitis 06/14/2021   Gastric ulcer 04/13/2021   Abdominal pain 03/15/2021   Fracture of femoral neck, right ([place]) 01/31/2021   Closed displaced  fracture of right femoral neck ([place]) 01/31/2021   Indigestion 11/02/2020   Change in stool caliber 11/02/2020   Loss of weight 08/23/2020   Nausea without vomiting 06/15/2020   Tick bite 04/10/2020   Medication refill 04/10/2020   Acquired trigger finger 03/28/2020   Pain in right knee 10/08/2019   Acute maxillary sinusitis 08/20/2019   Cellulitis, leg 08/10/2019   Cellulitis 08/08/2019   Fever 08/08/2019   Cellulitis of foot 08/08/2019   Hypokalemia 08/08/2019   Hyponatremia 08/08/2019   Essential hypertension 08/08/2019   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   Adverse food reaction 09/24/2018   Mild persistent asthma without complication 71/05/2693   Pain of left hip joint 07/17/2018   Dyspnea on exertion 01/24/2017   Cough variant asthma  vs UACS 01/24/2017   High risk medication use 09/25/2016   Varicose veins of lower extremities with other complications 85/46/2703   Varicose veins 02/15/2014   Vertigo 11/23/2013   Pain in limb 10/04/2013   Hip bursitis 02/11/2013   Constipation 02/16/2009   RECTAL BLEEDING 02/16/2009   HIGH BLOOD PRESSURE 01/29/2008    Past Surgical History:  Procedure Laterality Date   APPENDECTOMY     patient unsure   BIOPSY  12/21/2020   Procedure: BIOPSY;  Surgeon: Daneil Dolin, MD;  Location: [facility];  Service: Endoscopy;;  BIOPSY  05/28/2021   Procedure: BIOPSY;  Surgeon: Eloise Harman, DO;  Location: [facility];  Service: Endoscopy;;   CATARACT EXTRACTION W/PHACO Left 01/27/2017   Procedure: CATARACT EXTRACTION PHACO AND INTRAOCULAR LENS PLACEMENT (IOC);  Surgeon: Tonny Branch, MD;  Location: [facility];  Service: Ophthalmology;  Laterality: Left;  CDE: 29.90   CATARACT EXTRACTION W/PHACO Right 02/24/2017   Procedure: CATARACT EXTRACTION PHACO AND INTRAOCULAR LENS PLACEMENT (IOC);  Surgeon: Tonny Branch, MD;  Location: [facility];  Service: Ophthalmology;  Laterality: Right;  CDE: 8.57   CHOLECYSTECTOMY     COLONOSCOPY N/A 10/01/2016    Procedure: COLONOSCOPY;  Surgeon: Daneil Dolin, MD; pancolonic diverticulosis, melanosis coli, otherwise normal exam.  No recommendations to repeat colonoscopy.   COLONOSCOPY WITH PROPOFOL N/A 12/21/2020   Rourk: Diverticulosis   ESOPHAGOGASTRODUODENOSCOPY (EGD) WITH PROPOFOL N/A 12/21/2020   Rourk: Erosive reflux esophagitis, nonbleeding gastric ulcers with biopsy showing erosion/ulceration, negative for H. pylori.   ESOPHAGOGASTRODUODENOSCOPY (EGD) WITH PROPOFOL N/A 05/28/2021   Carver: gastritis (reactive gastropathy), no H.pylori   FLEXOR TENDON REPAIR Right 11/06/2017   Procedure: RIGHT WRIST FLEXOR TENDON REPAIRAND STT DEBRIEDMENT;  Surgeon: Leanora Cover, MD;  Location: [place];  Service: Orthopedics;  Laterality: Right;   MOUTH SURGERY     artery bleed   TOTAL HIP ARTHROPLASTY Right 01/31/2021   Procedure: TOTAL HIP ARTHROPLASTY ANTERIOR APPROACH;  Surgeon: Rod Can, MD;  Location: [facility];  Service: Orthopedics;  Laterality: Right;   TUBAL LIGATION      OB History     Gravida  2   Para  2   Term      Preterm      AB      Living  2      SAB      IAB      Ectopic      Multiple      Live Births  2            Home Medications    Prior to Admission medications   Medication Sig Start Date End Date Taking? Authorizing Provider  acetaminophen (TYLENOL) 500 MG tablet Take 500-1,000 mg by mouth as needed for mild pain or moderate pain.    [provider]  albuterol (VENTOLIN HFA) 108 (90 Base) MCG/ACT inhaler Inhale 2 puffs into the lungs every 6 (six) hours as needed for wheezing or shortness of breath.    [provider]  ALPRAZolam (XANAX) 0.5 MG tablet Take 0.5 mg by mouth in the morning, at noon, and at bedtime. (1000, 1500, AT BEDTIME)    [provider]  amLODipine (NORVASC) 5 MG tablet Take 5 mg by mouth 2 (two) times daily. 02/09/13   [provider]  aspirin EC 81 MG tablet Take 81 mg by  mouth daily. Swallow whole.    [provider]  azelastine (ASTELIN) 0.1 % nasal spray Place into both nostrils. 01/26/22   [provider]  cetirizine (ZYRTEC) 5 MG tablet     [provider]  Cholecalciferol (VITAMIN D3) 50 MCG (2000 UT) capsule Take 2,000 Units by mouth daily.    [provider]  diclofenac Sodium (VOLTAREN) 1 % GEL Apply 1 application topically as needed (back pain).    [provider]  famotidine (PEPCID) 20 MG tablet One after supper 08/01/22   Tanda Rockers, MD  levothyroxine (SYNTHROID) 50 MCG tablet Take 1 tablet (50 mcg total) by mouth daily before breakfast. 03/26/22  Brita Romp, NP  lidocaine (XYLOCAINE) 2 % solution Use as directed 5 mLs in the mouth or throat as needed for mouth pain. Gargle and spit 51m up to three times daily as needed for throat pain. 09/28/22   Leath-Warren, CAlda Lea NP  losartan (COZAAR) 50 MG tablet Take 50 mg by mouth 2 (two) times daily. 07/14/20   [provider]  Multiple Vitamin (MULTIVITAMIN WITH MINERALS) TABS tablet Take 1 tablet by mouth daily in the afternoon.    [provider]  mupirocin ointment (BACTROBAN) 2 % APPLY TO AFFECTED AREA DAILY 03/07/22   WTrula Slade DPM  pantoprazole (PROTONIX) 40 MG tablet TAKE 1 TABLET BY MOUTH ONCE A DAY. 06/19/22   LMahala Menghini PA-C  polyethylene glycol (MIRALAX / GLYCOLAX) 17 g packet Take 17 g by mouth daily.    [provider]  raloxifene (EVISTA) 60 MG tablet Take 1 tablet (60 mg total) by mouth every morning. Patient taking differently: Take 60 mg by mouth every other day. IN THE MORNING 04/10/20   GEstill Dooms NP  SYMBICORT 80-4.5 MCG/ACT inhaler INHALE 2 PUFFS INTO THE LUNGS TWICE DAILY. Patient taking differently: Inhale 2 puffs into the lungs daily. 02/02/18   WTanda Rockers MD  venlafaxine XR (EFFEXOR-XR) 75 MG 24 hr capsule venlafaxine ER 75 mg capsule,extended release 24 hr    [provider]    Family History Family History  Problem Relation Age of Onset   Hypertension Mother    Varicose Veins Father    Heart attack Father    Heart disease Father    Hypertension Brother    COPD Brother     Social History Social History   Tobacco Use   Smoking status: Former    Packs/day: 0.25    Years: 5.00    Total pack years: 1.25    Types: Cigarettes    Quit date: 12/02/1997    Years since quitting: 24.8    Passive exposure: Past   Smokeless tobacco: Never  Vaping Use   Vaping Use: Never used  Substance Use Topics   Alcohol use: Not Currently    Alcohol/week: 1.0 standard drink of alcohol    Types: 1 Glasses of wine per week   Drug use: No     Allergies   Amoxicillin-pot clavulanate, Ace inhibitors, Guaifenesin, Lisinopril, Cetirizine, Entex la, Gabapentin, Levofloxacin, and Meloxicam   Review of Systems Review of Systems Per HPI  Physical Exam Triage Vital Signs ED Triage Vitals  Enc Vitals Group     BP 10/01/22 1209 (!) 149/89     Pulse Rate 10/01/22 1209 78     Resp 10/01/22 1209 20     Temp 10/01/22 1209 98.1 F (36.7 C)     Temp Source 10/01/22 1209 Oral     SpO2 10/01/22 1209 97 %     Weight --      Height --      Head Circumference --      Peak Flow --      Pain Score 10/01/22 1207 10     Pain Loc --      Pain Edu? --      Excl. in [place] --    No data found.  Updated Vital Signs BP (!) 149/89 (BP Location: Right Arm)   Pulse 78   Temp 98.1 F (36.7 C) (Oral)   Resp 20   SpO2 97%   Visual Acuity Right Eye Distance:   Left  Eye Distance:   Bilateral Distance:    Right Eye Near:   Left Eye Near:    Bilateral Near:     Physical Exam Vitals and nursing note reviewed.  Constitutional:      General: She is not in acute distress.    Appearance: Normal appearance. She is not ill-appearing or toxic-appearing.  HENT:     Head: Normocephalic and atraumatic.     Right Ear: Tympanic membrane, ear canal and external ear  normal. No drainage, swelling or tenderness. No middle ear effusion. Tympanic membrane is not erythematous.     Left Ear: Tympanic membrane, ear canal and external ear normal. No drainage, swelling or tenderness.  No middle ear effusion. Tympanic membrane is not erythematous.     Nose: No congestion or rhinorrhea.     Mouth/Throat:     Mouth: Mucous membranes are moist.     Pharynx: Oropharynx is clear. Posterior oropharyngeal erythema present. No oropharyngeal exudate.     Tonsils: 0 on the right. 0 on the left.     Comments: Post nasal drainage Eyes:     General: No scleral icterus.    Extraocular Movements: Extraocular movements intact.  Cardiovascular:     Rate and Rhythm: Normal rate and regular rhythm.  Pulmonary:     Effort: Pulmonary effort is normal. No respiratory distress.     Breath sounds: Normal breath sounds. No wheezing, rhonchi or rales.  Abdominal:     General: Abdomen is flat. Bowel sounds are normal. There is no distension.     Palpations: Abdomen is soft.  Musculoskeletal:     Cervical back: Normal range of motion and neck supple.  Lymphadenopathy:     Cervical: No cervical adenopathy.  Skin:    General: Skin is warm and dry.     Coloration: Skin is not jaundiced or pale.     Findings: No erythema or rash.  Neurological:     Mental Status: She is alert and oriented to person, place, and time.     Motor: No weakness.  Psychiatric:        Behavior: Behavior is cooperative.      UC Treatments / Results  Labs (all labs ordered are listed, but only abnormal results are displayed) Labs Reviewed  SARS CORONAVIRUS 2 (TAT 6-24 HRS)  POCT RAPID STREP A (OFFICE)    EKG   Radiology No results found.  Procedures Procedures (including critical care time)  Medications Ordered in UC Medications - No data to display  Initial Impression / Assessment and Plan / UC Course  I have reviewed the triage vital signs and the nursing notes.  Pertinent labs &  imaging results that were available during my care of the patient were reviewed by me and considered in my medical decision making (see chart for details).   Patient is well-appearing, normotensive, afebrile, not tachycardic, not tachypneic, oxygenating well on room air.    Acute pharyngitis, unspecified etiology Post-nasal drainage Rapid strep throat test negative COVID-19 testing obtained I have very low suspicion for viral or bacterial cause of pharyngitis given length of symptoms and discussed with the patient at length Suspect pharyngitis secondary to allergic rhinitis/uncontrolled postnasal drainage Recommended restarting Flonase Follow-up with allergist as planned   The patient was given the opportunity to ask questions.  All questions answered to their satisfaction.  The patient is in agreement to this plan.    Final Clinical Impressions(s) / UC Diagnoses   Final diagnoses:  Acute pharyngitis, unspecified etiology  Post-nasal drainage     Discharge Instructions      Strep throat test today is negative.  We have tested you for COVID-19 and we will call you tomorrow if this is positive.  Your sore throat is likely coming from the postnasal drainage.  Please start Flonase nasal spray.    ED Prescriptions   None    PDMP not reviewed this encounter.   Eulogio Bear, NP 10/01/22 [phone]

## 2022-10-01 NOTE — Discharge Instructions (Signed)
Strep throat test today is negative.  We have tested you for COVID-19 and we will call you tomorrow if this is positive.  Your sore throat is likely coming from the postnasal drainage.  Please start Flonase nasal spray.

## 2022-10-01 NOTE — ED Triage Notes (Signed)
Pt reports seen for same on Saturday. Pt reports sore throat continues and pain has increased. Pt reports is taking lidocaine prescription as well as [employer] brand allergy relief every 4 hours.

## 2022-10-01 NOTE — Telephone Encounter (Signed)
Patient called and reported throat pain had increased and reports no change with prescription or otc medication that was recommended at [facility] visit on Saturday. Pt reported would like to be re-evaluated and reports allergist appointment is not until next week.

## 2022-10-02 LAB — SARS CORONAVIRUS 2 (TAT 6-24 HRS): SARS Coronavirus 2: NEGATIVE

## 2022-10-10 ENCOUNTER — Encounter: Payer: Self-pay | Admitting: Internal Medicine

## 2022-10-13 ENCOUNTER — Ambulatory Visit
Admission: EM | Admit: 2022-10-13 | Discharge: 2022-10-13 | Disposition: A | Discharge status: Home / Self Care | Payer: Medicare HMO | Attending: Nurse Practitioner | Admitting: Nurse Practitioner

## 2022-10-13 ENCOUNTER — Ambulatory Visit (INDEPENDENT_AMBULATORY_CARE_PROVIDER_SITE_OTHER): Payer: Medicare HMO

## 2022-10-13 DIAGNOSIS — S6991XA Unspecified injury of right wrist, hand and finger(s), initial encounter: Secondary | ICD-10-CM | POA: Diagnosis not present

## 2022-10-13 DIAGNOSIS — Z23 Encounter for immunization: Secondary | ICD-10-CM

## 2022-10-13 DIAGNOSIS — M79644 Pain in right finger(s): Secondary | ICD-10-CM

## 2022-10-13 DIAGNOSIS — M7989 Other specified soft tissue disorders: Secondary | ICD-10-CM | POA: Diagnosis not present

## 2022-10-13 MED ORDER — TETANUS-DIPHTH-ACELL PERTUSSIS 5-2.5-18.5 LF-MCG/0.5 IM SUSY
0.5000 mL | PREFILLED_SYRINGE | Freq: Once | INTRAMUSCULAR | Status: AC
  Administered 2022-10-13: 0.5 mL via INTRAMUSCULAR

## 2022-10-13 NOTE — ED Triage Notes (Signed)
Pt reports pain and bruise in right middle finger after she pinched with a rusty bobby pin lats night.

## 2022-10-13 NOTE — Discharge Instructions (Addendum)
The x-rays are negative for a fracture or dislocation.   May take Tylenol as needed to help with pain or discomfort. Apply ice to the right middle finger to help with bruising, pain, and swelling. Continue use of polysporin antibiotic ointment.  Continue to monitor the area for worsening redness, or if you develop drainage, or other concerns.  If you develop new symptoms, please follow-up in this clinic within the next 48 to 72 hours for reevaluation. Follow-up as needed.

## 2022-10-13 NOTE — ED Provider Notes (Signed)
[facility]    CSN: [id number] Arrival date & time: 10/13/22  1025      History   Chief Complaint No chief complaint on file.   HPI [name] is a 81 y.o. female.   The history is provided by the patient.   Patient presents for complaints of bruising and pain to the right middle finger after she accidentally stuck the finger with a safety pin last evening.  She states that she stuck the pen in the first joint of the finger, at which time bled a little.  She states that she used peroxide to the finger at that time.  She also states that she used Neosporin after the incident.  Patient states that subsequently thereafter, she washed her hair.  She states when she woke up this morning, she had bruising to the middle finger along the sides and at the tip.  She states that the finger was swollen.  She states that she does notice increased pain when she makes a fist or bends the finger.  She denies numbness, tingling, radiation of pain, decreased grip strength, fever, or chills.  She does take aspirin daily.  She states that she does not know when she had her last tetanus shot.  Past Medical History:  Diagnosis Date   Anemia    Anxiety    Arthritis    Asthma    Constipation    GERD (gastroesophageal reflux disease)    High blood pressure    History of COVID-19 12/28/2020   Hypothyroidism    PAD (peripheral artery disease) (HCC)    bilateral legs   Peripheral edema    Peripheral neuropathy    bilateral feet at night occ   Psoriasis    Seasonal allergies    Skin cancer    Left eye brow   Varicose veins    Vertigo 11/23/2013    Patient Active Problem List   Diagnosis Date Noted   Gastritis and gastroduodenitis 06/14/2021   Gastric ulcer 04/13/2021   Abdominal pain 03/15/2021   Fracture of femoral neck, right ([place]) 01/31/2021   Closed displaced fracture of right femoral neck ([place]) 01/31/2021   Indigestion 11/02/2020   Change in stool caliber 11/02/2020    Loss of weight 08/23/2020   Nausea without vomiting 06/15/2020   Tick bite 04/10/2020   Medication refill 04/10/2020   Acquired trigger finger 03/28/2020   Pain in right knee 10/08/2019   Acute maxillary sinusitis 08/20/2019   Cellulitis, leg 08/10/2019   Cellulitis 08/08/2019   Fever 08/08/2019   Cellulitis of foot 08/08/2019   Hypokalemia 08/08/2019   Hyponatremia 08/08/2019   Essential hypertension 08/08/2019   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   Adverse food reaction 09/24/2018   Mild persistent asthma without complication 47/08/6282   Pain of left hip joint 07/17/2018   Dyspnea on exertion 01/24/2017   Cough variant asthma  vs UACS 01/24/2017   High risk medication use 09/25/2016   Varicose veins of lower extremities with other complications 66/29/4765   Varicose veins 02/15/2014   Vertigo 11/23/2013   Pain in limb 10/04/2013   Hip bursitis 02/11/2013   Constipation 02/16/2009   RECTAL BLEEDING 02/16/2009   HIGH BLOOD PRESSURE 01/29/2008    Past Surgical History:  Procedure Laterality Date   APPENDECTOMY     patient unsure   BIOPSY  12/21/2020   Procedure: BIOPSY;  Surgeon: Daneil Dolin, MD;  Location: [facility];  Service: Endoscopy;;   BIOPSY  05/28/2021  Procedure: BIOPSY;  Surgeon: Eloise Harman, DO;  Location: [facility];  Service: Endoscopy;;   CATARACT EXTRACTION W/PHACO Left 01/27/2017   Procedure: CATARACT EXTRACTION PHACO AND INTRAOCULAR LENS PLACEMENT (IOC);  Surgeon: Tonny Branch, MD;  Location: [facility];  Service: Ophthalmology;  Laterality: Left;  CDE: 29.90   CATARACT EXTRACTION W/PHACO Right 02/24/2017   Procedure: CATARACT EXTRACTION PHACO AND INTRAOCULAR LENS PLACEMENT (IOC);  Surgeon: Tonny Branch, MD;  Location: [facility];  Service: Ophthalmology;  Laterality: Right;  CDE: 8.57   CHOLECYSTECTOMY     COLONOSCOPY N/A 10/01/2016   Procedure: COLONOSCOPY;  Surgeon: Daneil Dolin, MD; pancolonic diverticulosis, melanosis coli, otherwise  normal exam.  No recommendations to repeat colonoscopy.   COLONOSCOPY WITH PROPOFOL N/A 12/21/2020   Rourk: Diverticulosis   ESOPHAGOGASTRODUODENOSCOPY (EGD) WITH PROPOFOL N/A 12/21/2020   Rourk: Erosive reflux esophagitis, nonbleeding gastric ulcers with biopsy showing erosion/ulceration, negative for H. pylori.   ESOPHAGOGASTRODUODENOSCOPY (EGD) WITH PROPOFOL N/A 05/28/2021   Carver: gastritis (reactive gastropathy), no H.pylori   FLEXOR TENDON REPAIR Right 11/06/2017   Procedure: RIGHT WRIST FLEXOR TENDON REPAIRAND STT DEBRIEDMENT;  Surgeon: Leanora Cover, MD;  Location: [facility];  Service: Orthopedics;  Laterality: Right;   MOUTH SURGERY     artery bleed   TOTAL HIP ARTHROPLASTY Right 01/31/2021   Procedure: TOTAL HIP ARTHROPLASTY ANTERIOR APPROACH;  Surgeon: Rod Can, MD;  Location: [facility];  Service: Orthopedics;  Laterality: Right;   TUBAL LIGATION      OB History     Gravida  2   Para  2   Term      Preterm      AB      Living  2      SAB      IAB      Ectopic      Multiple      Live Births  2            Home Medications    Prior to Admission medications   Medication Sig Start Date End Date Taking? Authorizing Provider  acetaminophen (TYLENOL) 500 MG tablet Take 500-1,000 mg by mouth as needed for mild pain or moderate pain.    [provider]  albuterol (VENTOLIN HFA) 108 (90 Base) MCG/ACT inhaler Inhale 2 puffs into the lungs every 6 (six) hours as needed for wheezing or shortness of breath.    [provider]  ALPRAZolam (XANAX) 0.5 MG tablet Take 0.5 mg by mouth in the morning, at noon, and at bedtime. (1000, 1500, AT BEDTIME)    [provider]  amLODipine (NORVASC) 5 MG tablet Take 5 mg by mouth 2 (two) times daily. 02/09/13   [provider]  aspirin EC 81 MG tablet Take 81 mg by mouth daily. Swallow whole.    [provider]  azelastine (ASTELIN) 0.1 % nasal spray Place into both  nostrils. 01/26/22   [provider]  cetirizine (ZYRTEC) 5 MG tablet     [provider]  Cholecalciferol (VITAMIN D3) 50 MCG (2000 UT) capsule Take 2,000 Units by mouth daily.    [provider]  diclofenac Sodium (VOLTAREN) 1 % GEL Apply 1 application topically as needed (back pain).    [provider]  famotidine (PEPCID) 20 MG tablet One after supper 08/01/22   Tanda Rockers, MD  levothyroxine (SYNTHROID) 50 MCG tablet Take 1 tablet (50 mcg total) by mouth daily before breakfast. 03/26/22   Brita Romp, NP  lidocaine (XYLOCAINE) 2 % solution Use as directed 5 mLs in the mouth or throat as needed for mouth pain. Gargle and spit 71m up to three times daily as needed for throat pain. 09/28/22   Tyreshia Ingman-Warren, CAlda Lea NP  losartan (COZAAR) 50 MG tablet Take 50 mg by mouth 2 (two) times daily. 07/14/20   [provider]  Multiple Vitamin (MULTIVITAMIN WITH MINERALS) TABS tablet Take 1 tablet by mouth daily in the afternoon.    [provider]  mupirocin ointment (BACTROBAN) 2 % APPLY TO AFFECTED AREA DAILY 03/07/22   WTrula Slade DPM  pantoprazole (PROTONIX) 40 MG tablet TAKE 1 TABLET BY MOUTH ONCE A DAY. 06/19/22   LMahala Menghini PA-C  polyethylene glycol (MIRALAX / GLYCOLAX) 17 g packet Take 17 g by mouth daily.    [provider]  raloxifene (EVISTA) 60 MG tablet Take 1 tablet (60 mg total) by mouth every morning. Patient taking differently: Take 60 mg by mouth every other day. IN THE MORNING 04/10/20   GEstill Dooms NP  SYMBICORT 80-4.5 MCG/ACT inhaler INHALE 2 PUFFS INTO THE LUNGS TWICE DAILY. Patient taking differently: Inhale 2 puffs into the lungs daily. 02/02/18   WTanda Rockers MD  venlafaxine XR (EFFEXOR-XR) 75 MG 24 hr capsule venlafaxine ER 75 mg capsule,extended release 24 hr    [provider]    Family History Family History  Problem Relation Age of Onset   Hypertension Mother     Varicose Veins Father    Heart attack Father    Heart disease Father    Hypertension Brother    COPD Brother     Social History Social History   Tobacco Use   Smoking status: Former    Packs/day: 0.25    Years: 5.00    Total pack years: 1.25    Types: Cigarettes    Quit date: 12/02/1997    Years since quitting: 24.8    Passive exposure: Past   Smokeless tobacco: Never  Vaping Use   Vaping Use: Never used  Substance Use Topics   Alcohol use: Not Currently    Alcohol/week: 1.0 standard drink of alcohol    Types: 1 Glasses of wine per week   Drug use: No     Allergies   Amoxicillin-pot clavulanate, Ace inhibitors, Guaifenesin, Lisinopril, Cetirizine, Entex la, Gabapentin, Levofloxacin, and Meloxicam   Review of Systems Review of Systems Per HPI  Physical Exam Triage Vital Signs ED Triage Vitals  Enc Vitals Group     BP 10/13/22 1108 (!) 180/110     Pulse Rate 10/13/22 1108 72     Resp 10/13/22 1108 16     Temp 10/13/22 1108 98.1 F (36.7 C)     Temp Source 10/13/22 1108 Oral     SpO2 10/13/22 1108 98 %     Weight --      Height --      Head Circumference --      Peak Flow --      Pain Score 10/13/22 1114 5     Pain Loc --      Pain Edu? --      [place]    No data found.  Updated Vital Signs BP (!) 168/95   Pulse 72   Temp 98.1 F (36.7 C) (Oral)   Resp 16   SpO2 98%   Visual Acuity Right Eye Distance:   Left Eye Distance:   Bilateral Distance:  Right Eye Near:   Left Eye Near:    Bilateral Near:     Physical Exam Vitals and nursing note reviewed.  Constitutional:      General: She is not in acute distress.    Appearance: Normal appearance.  Eyes:     Extraocular Movements: Extraocular movements intact.     Pupils: Pupils are equal, round, and reactive to light.  Musculoskeletal:     Right hand: Swelling present. Normal capillary refill. Normal pulse.     Comments: Bruising to right middle finger. Bruising at the distal middle  finger extending to the proximal middle finger. Tenderness to the distal tip of the right middle finger.   Neurological:     General: No focal deficit present.     Mental Status: She is alert and oriented to person, place, and time.  Psychiatric:        Mood and Affect: Mood normal.        Behavior: Behavior normal.      UC Treatments / Results  Labs (all labs ordered are listed, but only abnormal results are displayed) Labs Reviewed - No data to display  EKG   Radiology DG Finger Middle Right  Result Date: 10/13/2022 CLINICAL DATA:  Finger stuck with a rusty safety pin. EXAM: RIGHT MIDDLE FINGER 2+V COMPARISON:  None Available. FINDINGS: No fracture. No bone lesion. No bone resorption to suggest osteomyelitis. Joints are normally aligned. Mild mid to distal soft tissue swelling suggested. No soft tissue air. No radiopaque foreign body. IMPRESSION: 1. No fracture, dislocation or evidence of osteomyelitis. 2. No radiopaque foreign body. Electronically Signed   By: Lajean Manes M.D.   On: 10/13/2022 12:09    Procedures Procedures (including critical care time)  Medications Ordered in UC Medications  Tdap (BOOSTRIX) injection 0.5 mL (0.5 mLs Intramuscular Given 10/13/22 1201)    Initial Impression / Assessment and Plan / UC Course  I have reviewed the triage vital signs and the nursing notes.  Pertinent labs & imaging results that were available during my care of the patient were reviewed by me and considered in my medical decision making (see chart for details).  Patient presents after a needlestick from a safety pin to the right middle finger.  On exam, patient is well-appearing, she is mildly hypertensive, but she is in no acute distress.  She does have bruising noted to the right middle finger with mild swelling.  There is no sign of infection or cellulitis noted.  Tdap was updated today.  Patient was advised to continue to monitor symptoms for worsening with strict return  precautions.  Patient was advised to ice the right middle finger to help with pain, bruising, and swelling.  X-rays were negative for fracture, dislocation, or evidence of foreign body.  Patient verbalizes understanding.  All questions were answered.  Patient is stable for discharge. Final Clinical Impressions(s) / UC Diagnoses   Final diagnoses:  Injury of finger of right hand, initial encounter     Discharge Instructions      The x-rays are negative for a fracture or dislocation.   May take Tylenol as needed to help with pain or discomfort. Apply ice to the right middle finger to help with bruising, pain, and swelling. Continue use of polysporin antibiotic ointment.  Continue to monitor the area for worsening redness, or if you develop drainage, or other concerns.  If you develop new symptoms, please follow-up in this clinic within the next 48 to 72 hours for reevaluation.  Follow-up as needed.     ED Prescriptions   None    PDMP not reviewed this encounter.   Tish Men, NP 10/13/22 1221

## 2022-10-16 ENCOUNTER — Ambulatory Visit: Payer: Medicare HMO | Admitting: Allergy & Immunology

## 2022-10-16 ENCOUNTER — Telehealth: Payer: Self-pay

## 2022-10-16 ENCOUNTER — Encounter: Payer: Self-pay | Admitting: Allergy & Immunology

## 2022-10-16 VITALS — BP 158/94 | HR 80 | Temp 97.3°F | Resp 18 | Ht 64.0 in | Wt 157.6 lb

## 2022-10-16 DIAGNOSIS — J4530 Mild persistent asthma, uncomplicated: Secondary | ICD-10-CM

## 2022-10-16 DIAGNOSIS — J3089 Other allergic rhinitis: Secondary | ICD-10-CM | POA: Diagnosis not present

## 2022-10-16 DIAGNOSIS — J302 Other seasonal allergic rhinitis: Secondary | ICD-10-CM | POA: Insufficient documentation

## 2022-10-16 MED ORDER — AZELASTINE-FLUTICASONE 137-50 MCG/ACT NA SUSP: 2.0000 | NASAL | 5 refills | Status: DC

## 2022-10-16 MED ORDER — MONTELUKAST SODIUM 10 MG PO TABS: 10.0000 mg | ORAL_TABLET | Freq: Every day | ORAL | 5 refills | Status: DC

## 2022-10-16 MED ORDER — LEVOCETIRIZINE DIHYDROCHLORIDE 5 MG PO TABS: 5.0000 mg | ORAL_TABLET | Freq: Every evening | ORAL | 5 refills | Status: DC

## 2022-10-16 NOTE — Telephone Encounter (Signed)
PA request received through [facility] from [facility] for Azelastine-Fluticasone 137-50MCG/ACT suspension.  PA has been submitted and is awaiting determination.   Key: [id number] - PA Case ID: [id number]

## 2022-10-16 NOTE — Patient Instructions (Addendum)
1. Mild persistent asthma without complication - Lung testing looked good today.  - Dr. Melvyn Novas seems to have this under good control.  2. Seasonal and perennial allergic rhinitis - Testing today showed: grasses, indoor molds, outdoor molds, dust mites, and cockroach - Copy of test results provided.  - Avoidance measures provided. - Stop taking: Chlortab and Zyrtec and all of your nasal sprays  - Continue with:  - Start taking: Xyzal (levocetirizine) '5mg'$  tablet once daily, Singulair (montelukast) '10mg'$  daily, and Dymista (fluticasone/azelastine) two sprays per nostril 1-2 times daily as needed - The Singulair (montelukast) can cause irritability/depression, so beware of that when starting it and stop it if it becomes a problem (a LOT of people tolerate this without any of the side effects).  - You can use an extra dose of the antihistamine, if needed, for breakthrough symptoms.  - Consider nasal saline rinses 1-2 times daily to remove allergens from the nasal cavities as well as help with mucous clearance (this is especially helpful to do before the nasal sprays are given) - Consider allergy shots as a means of long-term control. - Allergy shots "re-train" and "reset" the immune system to ignore environmental allergens and decrease the resulting immune response to those allergens (sneezing, itchy watery eyes, runny nose, nasal congestion, etc).    - Allergy shots improve symptoms in 75-85% of patients.  - We can discuss more at the next appointment if the medications are not working for you.  3. Return in about 6 weeks (around 11/27/2022).    Please inform us of any Emergency Department visits, hospitalizations, or changes in symptoms. Call us before going to the ED for breathing or allergy symptoms since we might be able to fit you in for a sick visit. Feel free to contact us anytime with any questions, problems, or concerns.  It was a pleasure to meet you today! You are a DOLL!   Websites  that have reliable patient information: 1. American Academy of Asthma, Allergy, and Immunology: www.aaaai.org 2. Food Allergy Research and Education (FARE): foodallergy.org 3. Mothers of Asthmatics: http://www.asthmacommunitynetwork.org 4. American College of Allergy, Asthma, and Immunology: www.acaai.org   COVID-19 Vaccine Information can be found at: [email or web address] For questions related to vaccine distribution or appointments, please email [email or web address] or call [phone].   We realize that you might be concerned about having an allergic reaction to the COVID19 vaccines. To help with that concern, WE ARE OFFERING THE COVID19 VACCINES IN OUR OFFICE! Ask the front desk for dates!     "Like" [place] on Facebook and Instagram for our latest updates!      A healthy democracy works best when [employer] participate! Make sure you are registered to vote! If you have moved or changed any of your contact information, you will need to get this updated before voting!  In some cases, you MAY be able to register to vote online: [email or web address]         Airborne Adult Perc - 10/16/22 1033     Time Antigen Placed Celoron    Location Back    Number of Test 59    1. Control-Buffer 50% Glycerol Negative    2. Control-Histamine 1 mg/ml 2+    3. Albumin saline Negative    4. [place] Negative    5. [place] Negative    6. Johnson Negative    7. Danforth Blue Negative    8. Meadow Fescue Negative    9. Perennial Rye Negative  10. Sweet Vernal Negative    11. Timothy Negative    12. Cocklebur Negative    13. Burweed Marshelder Negative    14. Ragweed, short Negative    15. Ragweed, Giant Negative    16. Plantain,  English Negative    17. Lamb's Quarters Negative    18. Sheep Sorrell Negative    19. Rough Pigweed Negative    20. Marsh Elder, Rough Negative     21. Mugwort, Common Negative    22. Ash mix Negative    23. Birch mix Negative    24. Beech American Negative    25. Box, Elder Negative    26. Cedar, red Negative    27. Cottonwood, [place] Negative    28. Elm mix Negative    29. Hickory Negative    30. Maple mix Negative    31. Oak, [place] mix Negative    32. Pecan Pollen Negative    33. Pine mix Negative    34. Sycamore Eastern Negative    35. [place], Black Pollen Negative    36. Alternaria alternata Negative    37. Cladosporium Herbarum Negative    38. Aspergillus mix Negative    39. Penicillium mix Negative    40. Bipolaris sorokiniana (Helminthosporium) Negative    41. Drechslera spicifera (Curvularia) Negative    42. Mucor plumbeus Negative    43. Fusarium moniliforme Negative    44. Aureobasidium pullulans (pullulara) Negative    45. Rhizopus oryzae Negative    46. Botrytis cinera Negative    47. Epicoccum nigrum Negative    48. Phoma betae Negative    49. Candida Albicans Negative    50. Trichophyton mentagrophytes Negative    51. Mite, D Farinae  5,000 AU/ml Negative    52. Mite, D Pteronyssinus  5,000 AU/ml Negative    53. Cat Hair 10,000 BAU/ml Negative    54.  Dog Epithelia Negative    55. Mixed Feathers Negative    56. Horse Epithelia Negative    57. Cockroach, German Negative    58. Mouse Negative    59. Tobacco Leaf Negative             Intradermal - 10/16/22 1034     Time Antigen Placed 1045    Allergen Manufacturer Greer    Location Arm    Number of Test 15    Control Negative    [place] 3+    Johnson Negative    7 Grass 2+    Ragweed mix Negative    Weed mix Negative    Tree mix Negative    Mold 1 2+    Mold 2 3+    Mold 3 2+    Mold 4 2+    Cat Negative    Dog Negative    Cockroach 3+    Mite mix 2+            Reducing Pollen Exposure  The American Academy of Allergy, Asthma and Immunology suggests the following steps to reduce your exposure to pollen during allergy  seasons.    Do not hang sheets or clothing out to dry; pollen may collect on these items. Do not mow lawns or spend time around freshly cut grass; mowing stirs up pollen. Keep windows closed at night.  Keep car windows closed while driving. Minimize morning activities outdoors, a time when pollen counts are usually at their highest. Stay indoors as much as possible when pollen counts or humidity is high and on windy  days when pollen tends to remain in the air longer. Use air conditioning when possible.  Many air conditioners have filters that trap the pollen spores. Use a HEPA room air filter to remove pollen form the indoor air you breathe.  Control of Mold Allergen   Mold and fungi can grow on a variety of surfaces provided certain temperature and moisture conditions exist.  Outdoor molds grow on plants, decaying vegetation and soil.  The major outdoor mold, Alternaria and Cladosporium, are found in very high numbers during hot and dry conditions.  Generally, a late Summer - Fall peak is seen for common outdoor fungal spores.  Rain will temporarily lower outdoor mold spore count, but counts rise rapidly when the rainy period ends.  The most important indoor molds are Aspergillus and Penicillium.  Dark, humid and poorly ventilated basements are ideal sites for mold growth.  The next most common sites of mold growth are the bathroom and the kitchen.  Outdoor (Seasonal) Mold Control  Positive outdoor molds via skin testing: Alternaria, Cladosporium, Bipolaris (Helminthsporium), Drechslera (Curvalaria), and Mucor  Use air conditioning and keep windows closed Avoid exposure to decaying vegetation. Avoid leaf raking. Avoid grain handling. Consider wearing a face mask if working in moldy areas.    Indoor (Perennial) Mold Control   Positive indoor molds via skin testing: Aspergillus, Penicillium, Fusarium, Aureobasidium (Pullulara), and Rhizopus  Maintain humidity below 50%. Clean washable  surfaces with 5% bleach solution. Remove sources e.g. contaminated carpets.     Control of Dust Mite Allergen    Dust mites play a major role in allergic asthma and rhinitis.  They occur in environments with high humidity wherever human skin is found.  Dust mites absorb humidity from the atmosphere (ie, they do not drink) and feed on organic matter (including shed human and animal skin).  Dust mites are a microscopic type of insect that you cannot see with the naked eye.  High levels of dust mites have been detected from mattresses, pillows, carpets, upholstered furniture, bed covers, clothes, soft toys and any woven material.  The principal allergen of the dust mite is found in its feces.  A gram of dust may contain 1,000 mites and 250,000 fecal particles.  Mite antigen is easily measured in the air during house cleaning activities.  Dust mites do not bite and do not cause harm to humans, other than by triggering allergies/asthma.    Ways to decrease your exposure to dust mites in your home:  Encase mattresses, box springs and pillows with a mite-impermeable barrier or cover   Wash sheets, blankets and drapes weekly in hot water (130 F) with detergent and dry them in a dryer on the hot setting.  Have the room cleaned frequently with a vacuum cleaner and a damp dust-mop.  For carpeting or rugs, vacuuming with a vacuum cleaner equipped with a high-efficiency particulate air (HEPA) filter.  The dust mite allergic individual should not be in a room which is being cleaned and should wait 1 hour after cleaning before going into the room. Do not sleep on upholstered furniture (eg, couches).   If possible removing carpeting, upholstered furniture and drapery from the home is ideal.  Horizontal blinds should be eliminated in the rooms where the person spends the most time (bedroom, study, television room).  Washable vinyl, roller-type shades are optimal. Remove all non-washable stuffed toys from the  bedroom.  Wash stuffed toys weekly like sheets and blankets above.   Reduce indoor humidity to less  than 50%.  Inexpensive humidity monitors can be purchased at most hardware stores.  Do not use a humidifier as can make the problem worse and are not recommended.   Control of Cockroach Allergen  Cockroach allergen has been identified as an important cause of acute attacks of asthma, especially in urban settings.  There are fifty-five species of cockroach that exist in the [place], however only three, the [place], [place] species produce allergen that can affect patients with Asthma.  Allergens can be obtained from fecal particles, egg casings and secretions from cockroaches.    Remove food sources. Reduce access to water. Seal access and entry points. Spray runways with 0.5-1% Diazinon or Chlorpyrifos Blow boric acid power under stoves and refrigerator. Place bait stations (hydramethylnon) at feeding sites.   Allergy Shots   Allergies are the result of a chain reaction that starts in the immune system. Your immune system controls how your body defends itself. For instance, if you have an allergy to pollen, your immune system identifies pollen as an invader or allergen. Your immune system overreacts by producing antibodies called Immunoglobulin E (IgE). These antibodies travel to cells that release chemicals, causing an allergic reaction.  The concept behind allergy immunotherapy, whether it is received in the form of shots or tablets, is that the immune system can be desensitized to specific allergens that trigger allergy symptoms. Although it requires time and patience, the payback can be long-term relief.  How Do Allergy Shots Work?  Allergy shots work much like a vaccine. Your body responds to injected amounts of a particular allergen given in increasing doses, eventually developing a resistance and tolerance to it. Allergy shots can lead to decreased, minimal or no  allergy symptoms.  There generally are two phases: build-up and maintenance. Build-up often ranges from three to six months and involves receiving injections with increasing amounts of the allergens. The shots are typically given once or twice a week, though more rapid build-up schedules are sometimes used.  The maintenance phase begins when the most effective dose is reached. This dose is different for each person, depending on how allergic you are and your response to the build-up injections. Once the maintenance dose is reached, there are longer periods between injections, typically two to four weeks.  Occasionally doctors give cortisone-type shots that can temporarily reduce allergy symptoms. These types of shots are different and should not be confused with allergy immunotherapy shots.  Who Can Be Treated with Allergy Shots?  Allergy shots may be a good treatment approach for people with allergic rhinitis (hay fever), allergic asthma, conjunctivitis (eye allergy) or stinging insect allergy.   Before deciding to begin allergy shots, you should consider:   The length of allergy season and the severity of your symptoms  Whether medications and/or changes to your environment can control your symptoms  Your desire to avoid long-term medication use  Time: allergy immunotherapy requires a major time commitment  Cost: may vary depending on your insurance coverage  Allergy shots for children age 62 and older are effective and often well tolerated. They might prevent the onset of new allergen sensitivities or the progression to asthma.  Allergy shots are not started on patients who are pregnant but can be continued on patients who become pregnant while receiving them. In some patients with other medical conditions or who take certain common medications, allergy shots may be of risk. It is important to mention other medications you talk to your allergist.   When  Will I Feel Better?  Some may  experience decreased allergy symptoms during the build-up phase. For others, it may take as long as 12 months on the maintenance dose. If there is no improvement after a year of maintenance, your allergist will discuss other treatment options with you.  If you aren't responding to allergy shots, it may be because there is not enough dose of the allergen in your vaccine or there are missing allergens that were not identified during your allergy testing. Other reasons could be that there are high levels of the allergen in your environment or major exposure to non-allergic triggers like tobacco smoke.  What Is the Length of Treatment?  Once the maintenance dose is reached, allergy shots are generally continued for three to five years. The decision to stop should be discussed with your allergist at that time. Some people may experience a permanent reduction of allergy symptoms. Others may relapse and a longer course of allergy shots can be considered.  What Are the Possible Reactions?  The two types of adverse reactions that can occur with allergy shots are local and systemic. Common local reactions include very mild redness and swelling at the injection site, which can happen immediately or several hours after. A systemic reaction, which is less common, affects the entire body or a particular body system. They are usually mild and typically respond quickly to medications. Signs include increased allergy symptoms such as sneezing, a stuffy nose or hives.  Rarely, a serious systemic reaction called anaphylaxis can develop. Symptoms include swelling in the throat, wheezing, a feeling of tightness in the chest, nausea or dizziness. Most serious systemic reactions develop within 30 minutes of allergy shots. This is why it is strongly recommended you wait in your doctor's office for 30 minutes after your injections. Your allergist is trained to watch for reactions, and his or her staff is trained and equipped  with the proper medications to identify and treat them.  Who Should Administer Allergy Shots?  The preferred location for receiving shots is your prescribing allergist's office. Injections can sometimes be given at another facility where the physician and staff are trained to recognize and treat reactions, and have received instructions by your prescribing allergist.

## 2022-10-16 NOTE — Progress Notes (Addendum)
NEW PATIENT  Date of Service/Encounter:  10/16/22  Consult requested by: [name], MD   Assessment:   Mild persistent asthma without complication  Seasonal and perennial allergic rhinitis (grasses, indoor molds, outdoor molds, dust mites, and cockroach) - would likely benefit from allergen immunotherapy, although her duties at home taking care of her husband likely keep that from being an effective or easy treatment at this point in time  Remote history of smoking   Plan/Recommendations:   1. Mild persistent asthma without complication - Lung testing looked good today.  - Dr. Melvyn Novas seems to have this under good control.  2. Seasonal and perennial allergic rhinitis - Testing today showed: grasses, indoor molds, outdoor molds, dust mites, and cockroach - Copy of test results provided.  - Avoidance measures provided. - Stop taking: Chlortab and Zyrtec and all of your nasal sprays  - Continue with:  - Start taking: Xyzal (levocetirizine) '5mg'$  tablet once daily, Singulair (montelukast) '10mg'$  daily, and Dymista (fluticasone/azelastine) two sprays per nostril 1-2 times daily as needed - The Singulair (montelukast) can cause irritability/depression, so beware of that when starting it and stop it if it becomes a problem (a LOT of people tolerate this without any of the side effects).  - You can use an extra dose of the antihistamine, if needed, for breakthrough symptoms.  - Consider nasal saline rinses 1-2 times daily to remove allergens from the nasal cavities as well as help with mucous clearance (this is especially helpful to do before the nasal sprays are given) - Consider allergy shots as a means of long-term control. - Allergy shots "re-train" and "reset" the immune system to ignore environmental allergens and decrease the resulting immune response to those allergens (sneezing, itchy watery eyes, runny nose, nasal congestion, etc).    - Allergy shots improve symptoms in 75-85% of  patients.  - We can discuss more at the next appointment if the medications are not working for you.  3. Return in about 6 weeks (around 11/27/2022).   Subjective:   [name] is a 81 y.o. female presenting today for evaluation of  Chief Complaint  Patient presents with   Allergy Testing    Environmental: All except cats/dogs Food: No Hx    MALONI MUSLEH has a history of the following: Patient Active Problem List   Diagnosis Date Noted   Seasonal and perennial allergic rhinitis 10/16/2022   Gastritis and gastroduodenitis 06/14/2021   Gastric ulcer 04/13/2021   Abdominal pain 03/15/2021   Fracture of femoral neck, right ([place]) 01/31/2021   Closed displaced fracture of right femoral neck ([place]) 01/31/2021   Indigestion 11/02/2020   Change in stool caliber 11/02/2020   Loss of weight 08/23/2020   Nausea without vomiting 06/15/2020   Tick bite 04/10/2020   Medication refill 04/10/2020   Acquired trigger finger 03/28/2020   Pain in right knee 10/08/2019   Acute maxillary sinusitis 08/20/2019   Cellulitis, leg 08/10/2019   Cellulitis 08/08/2019   Fever 08/08/2019   Cellulitis of foot 08/08/2019   Hypokalemia 08/08/2019   Hyponatremia 08/08/2019   Essential hypertension 08/08/2019   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  [name] was referred by [name], MD.     Chele is a 81 y.o. female  presenting for an evaluation of coughing . This al lstarted around the first of September. She reports that she has had some coughing and production of yellow mucous with drainage. tThis has been more than two months. She went to see Dr. Nevada Crane at some point earlier in the process. She was treated for a viral infection with antibiotics and prednisone. This does not help at all when she is on antibiotics and prednisone.  She has been to Urgent Care three times with this problem. She has been back to her PCP three more times. She also saw Dr. Melvyn Novas. She has a pre-existing relationship with Dr. Melvyn Novas from 5-6 years ago. She has apparently been diagnosed with COPD in the distant past but a previous PCP. But Dr. Melvyn Novas told her that she does not have COPD. She has really been fine for 5-6 years. She was prescribed Symbicort two puffs BID. Her PCP has been refilling that routinely.   She has been on the Symbicort for several years without a problem. Dr. Nevada Crane has been refilling this for her. She has never smoked at all. She has been retired since 1999. She was a "closet smoker" for ten years before retiring.   At her last visit, Dr. Melvyn Novas recommended that she see an allergy specialists which is why she is here today. She is currently on Chlortab 4 mg every 6 hours as needed. She is on two nose sprays, although these are not listed.   She has been trialed on the lidocaine mouthwash at Urgent Care. This did not really work at all. This did numb her throat but it only worked until she drank something which rinsed it off. The hoarseness wears off around lunchtime but the coughing continues throughout the day.   She has been on allergy shots in the distant past. She took one shot weekly for at least 30 or 40 years. This did help a lot with her symptoms at the time.    GERD Symptom History: Dr. Melvyn Novas put her on Pepcid once daily. She does this at night. She is unsure whether this has helped.   Otherwise, there is no  history of other atopic diseases, including drug allergies, stinging insect allergies, eczema, urticaria, or contact dermatitis. There is no significant infectious history. Vaccinations are up to date.    Past Medical History: Patient Active Problem List   Diagnosis Date Noted   Seasonal and perennial allergic rhinitis 10/16/2022   Gastritis and gastroduodenitis 06/14/2021   Gastric ulcer 04/13/2021   Abdominal pain 03/15/2021   Fracture of femoral neck, right ([place]) 01/31/2021   Closed displaced fracture of right femoral neck ([place]) 01/31/2021   Indigestion 11/02/2020   Change in stool caliber 11/02/2020   Loss of weight 08/23/2020   Nausea without vomiting 06/15/2020   Tick bite 04/10/2020   Medication refill 04/10/2020   Acquired trigger finger 03/28/2020   Pain in right knee 10/08/2019   Acute maxillary sinusitis 08/20/2019   Cellulitis, leg 08/10/2019   Cellulitis 08/08/2019   Fever 08/08/2019   Cellulitis of foot 08/08/2019   Hypokalemia 08/08/2019   Hyponatremia 08/08/2019   Essential hypertension 08/08/2019  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   Adverse food reaction 09/24/2018   Mild persistent asthma without complication 08/65/7846   Pain of left hip joint 07/17/2018   Dyspnea on exertion 01/24/2017   Cough variant asthma  vs UACS 01/24/2017   High risk medication use 09/25/2016   Varicose veins of lower extremities with other complications 96/29/5284   Varicose veins 02/15/2014   Vertigo 11/23/2013   Pain in limb 10/04/2013   Hip bursitis 02/11/2013   Constipation 02/16/2009   RECTAL BLEEDING 02/16/2009   HIGH BLOOD PRESSURE 01/29/2008    Medication List:  Allergies as of 10/16/2022       Reactions   Amoxicillin-pot Clavulanate Nausea And Vomiting, Other (See Comments), Itching, Nausea Only   Has patient had a PCN reaction causing immediate rash, facial/tongue/throat swelling, SOB or lightheadedness with hypotension: No Has patient had a PCN reaction  causing severe rash involving mucus membranes or skin necrosis: No Has patient had a PCN reaction that required hospitalization: No Has patient had a PCN reaction occurring within the last 10 years: No If all of the above answers are "NO", then may proceed with Cephalosporin use. Has patient had a PCN reaction causing immediate rash, facial/tongue/throat swelling, SOB or lightheadedness with hypotension: No Has patient had a PCN reaction causing severe rash involving mucus membranes or skin necrosis: No Has patient had a PCN reaction that required hospitalization: No Has patient had a PCN reaction occurring within the last 10 years: No If all of the above answers are "NO", then may proceed with Cephalosporin use.   Ace Inhibitors Cough   Guaifenesin Hives, Other (See Comments)   Lisinopril Cough, Other (See Comments)   Cetirizine    Entex La Itching   Gabapentin    Other reaction(s): Unknown   Levofloxacin Hives, Itching   Meloxicam Other (See Comments)   (MOBIC) Unknown reaction type (maybe nausea?)        Medication List        Accurate as of October 16, 2022 11:48 AM. If you have any questions, ask your nurse or doctor.          STOP taking these medications    amLODipine 5 MG tablet Commonly known as: NORVASC Stopped by: Valentina Shaggy, MD   cetirizine 5 MG tablet Commonly known as: ZYRTEC Stopped by: Valentina Shaggy, MD       TAKE these medications    acetaminophen 500 MG tablet Commonly known as: TYLENOL Take 500-1,000 mg by mouth as needed for mild pain or moderate pain.   albuterol 108 (90 Base) MCG/ACT inhaler Commonly known as: VENTOLIN HFA Inhale 2 puffs into the lungs every 6 (six) hours as needed for wheezing or shortness of breath.   ALPRAZolam 0.5 MG tablet Commonly known as: XANAX Take 0.5 mg by mouth in the morning, at noon, and at bedtime. (1000, 1500, AT BEDTIME)   aspirin EC 81 MG tablet Take 81 mg by mouth daily. Swallow  whole.   azelastine 0.1 % nasal spray Commonly known as: ASTELIN Place into both nostrils.   Azelastine-Fluticasone 137-50 MCG/ACT Susp Commonly known as: Dymista Place 2 sprays into both nostrils 1 day or 1 dose. Started by: Valentina Shaggy, MD   Chlorpheniramine-DM 4-30 MG Tabs Take 1 tablet by mouth every 6 (six) hours as needed.   diclofenac Sodium 1 % Gel Commonly known as: VOLTAREN Apply 1 application topically as needed (back pain).   famotidine 20 MG tablet Commonly known as: Pepcid One after  supper   levocetirizine 5 MG tablet Commonly known as: XYZAL Take 1 tablet (5 mg total) by mouth every evening. Started by: Valentina Shaggy, MD   levothyroxine 50 MCG tablet Commonly known as: SYNTHROID Take 1 tablet (50 mcg total) by mouth daily before breakfast.   lidocaine 2 % solution Commonly known as: XYLOCAINE Use as directed 5 mLs in the mouth or throat as needed for mouth pain. Gargle and spit 59m up to three times daily as needed for throat pain.   losartan 50 MG tablet Commonly known as: COZAAR Take 50 mg by mouth 2 (two) times daily.   montelukast 10 MG tablet Commonly known as: Singulair Take 1 tablet (10 mg total) by mouth at bedtime. Started by: JValentina Shaggy MD   multivitamin with minerals Tabs tablet Take 1 tablet by mouth daily in the afternoon.   mupirocin ointment 2 % Commonly known as: BACTROBAN APPLY TO AFFECTED AREA DAILY   pantoprazole 40 MG tablet Commonly known as: PROTONIX TAKE 1 TABLET BY MOUTH ONCE A DAY.   polyethylene glycol 17 g packet Commonly known as: MIRALAX / GLYCOLAX Take 17 g by mouth daily.   raloxifene 60 MG tablet Commonly known as: EVISTA Take 1 tablet (60 mg total) by mouth every morning. What changed:  when to take this additional instructions   Symbicort 80-4.5 MCG/ACT inhaler Generic drug: budesonide-formoterol INHALE 2 PUFFS INTO THE LUNGS TWICE DAILY. What changed: when to take this    venlafaxine XR 75 MG 24 hr capsule Commonly known as: EFFEXOR-XR venlafaxine ER 75 mg capsule,extended release 24 hr   Vitamin D3 50 MCG (2000 UT) capsule Take 2,000 Units by mouth daily.        Birth History: non-contributory  Developmental History: non-contributory  Past Surgical History: Past Surgical History:  Procedure Laterality Date   APPENDECTOMY     patient unsure   BIOPSY  12/21/2020   Procedure: BIOPSY;  Surgeon: RDaneil Dolin MD;  [facility];  Service: Endoscopy;;   BIOPSY  05/28/2021   Procedure: BIOPSY;  Surgeon: CEloise Harman DO;  [facility];  Service: Endoscopy;;   CATARACT EXTRACTION W/PHACO Left 01/27/2017   Procedure: CATARACT EXTRACTION PHACO AND INTRAOCULAR LENS PLACEMENT ([place];  Surgeon: KTonny Branch MD;  Location: [facility];  Service: Ophthalmology;  Laterality: Left;  CDE: 29.90   CATARACT EXTRACTION W/PHACO Right 02/24/2017   Procedure: CATARACT EXTRACTION PHACO AND INTRAOCULAR LENS PLACEMENT ([facility]);  Surgeon: KTonny Branch MD;  Location: [facility];  Service: Ophthalmology;  Laterality: Right;  CDE: 8.57   CHOLECYSTECTOMY     COLONOSCOPY N/A 10/01/2016   Procedure: COLONOSCOPY;  Surgeon: RDaneil Dolin MD; pancolonic diverticulosis, melanosis coli, otherwise normal exam.  No recommendations to repeat colonoscopy.   COLONOSCOPY WITH PROPOFOL N/A 12/21/2020   Rourk: Diverticulosis   ESOPHAGOGASTRODUODENOSCOPY (EGD) WITH PROPOFOL N/A 12/21/2020   Rourk: Erosive reflux esophagitis, nonbleeding gastric ulcers with biopsy showing erosion/ulceration, negative for H. pylori.   ESOPHAGOGASTRODUODENOSCOPY (EGD) WITH PROPOFOL N/A 05/28/2021   Carver: gastritis (reactive gastropathy), no H.pylori   FLEXOR TENDON REPAIR Right 11/06/2017   Procedure: RIGHT WRIST FLEXOR TENDON REPAIRAND STT DEBRIEDMENT;  Surgeon: KLeanora Cover MD;  [facility]  Service: Orthopedics;  Laterality: Right;   MOUTH SURGERY     artery  bleed   TOTAL HIP ARTHROPLASTY Right 01/31/2021   Procedure: TOTAL HIP ARTHROPLASTY ANTERIOR APPROACH;  Surgeon: SRod Can MD;  Location: [facility];  Service: Orthopedics;  Laterality:  Right;   TUBAL LIGATION       Family History: Family History  Problem Relation Age of Onset   Hypertension Mother    Varicose Veins Father    Heart attack Father    Heart disease Father    Hypertension Brother    COPD Brother      Social History: Josephina lives at home with her husband who has Alzheimer's. He is still living at home, but he has four around the clock sitters. They live in a house that is 80 years old. There is carpeting in the main living areas and hardwood flooring in the bedroom. There is electric heating with central cooling. They also have a heat pump. There are no dust mite coverings on the bedding. There is no tobacco exposure at this time. She retired in 1999. She worked as a Manufacturing engineer.    Review of Systems  Constitutional: Negative.  Negative for chills, fever, malaise/fatigue and weight loss.  HENT:  Positive for congestion. Negative for ear discharge and ear pain.   Eyes:  Negative for pain, discharge and redness.  Respiratory:  Positive for cough, sputum production and shortness of breath. Negative for wheezing.   Cardiovascular: Negative.  Negative for chest pain and palpitations.  Gastrointestinal:  Negative for abdominal pain, constipation, diarrhea, heartburn, nausea and vomiting.  Skin: Negative.  Negative for itching and rash.  Neurological:  Negative for dizziness and headaches.  Endo/Heme/Allergies:  Negative for environmental allergies. Does not bruise/bleed easily.       Objective:   Blood pressure (!) 158/94, pulse 80, temperature (!) 97.3 F (36.3 C), resp. rate 18, height '5\' 4"'$  (1.626 m), weight 157 lb 9.6 oz (71.5 kg), SpO2 94 %. Body mass index is 27.05 kg/m.     Physical Exam Vitals reviewed.  Constitutional:      Appearance: She is  well-developed.  HENT:     Head: Normocephalic and atraumatic.     Right Ear: Tympanic membrane, ear canal and external ear normal. No drainage, swelling or tenderness. Tympanic membrane is not injected, scarred, erythematous, retracted or bulging.     Left Ear: Tympanic membrane, ear canal and external ear normal. No drainage, swelling or tenderness. Tympanic membrane is not injected, scarred, erythematous, retracted or bulging.     Nose: No nasal deformity, septal deviation, mucosal edema or rhinorrhea.     Right Turbinates: Enlarged, swollen and pale.     Left Turbinates: Enlarged, swollen and pale.     Right Sinus: No maxillary sinus tenderness or frontal sinus tenderness.     Left Sinus: No maxillary sinus tenderness or frontal sinus tenderness.     Mouth/Throat:     Lips: Pink.     Mouth: Mucous membranes are moist. Mucous membranes are not pale and not dry.     Pharynx: Uvula midline.  Eyes:     General:        Right eye: No discharge.        Left eye: No discharge.     Conjunctiva/sclera: Conjunctivae normal.     Right eye: Right conjunctiva is not injected. No chemosis.    Left eye: Left conjunctiva is not injected. No chemosis.    Pupils: Pupils are equal, round, and reactive to light.  Cardiovascular:     Rate and Rhythm: Normal rate and regular rhythm.     Heart sounds: Normal heart sounds.  Pulmonary:     Effort: Pulmonary effort is normal. No tachypnea, accessory muscle usage or respiratory  distress.     Breath sounds: Normal breath sounds. No wheezing, rhonchi or rales.     Comments: No wheezing or crackles noted.  Chest:     Chest wall: No tenderness.  Abdominal:     Tenderness: There is no abdominal tenderness. There is no guarding or rebound.  Lymphadenopathy:     Head:     Right side of head: No submandibular, tonsillar or occipital adenopathy.     Left side of head: No submandibular, tonsillar or occipital adenopathy.     Cervical: No cervical adenopathy.   Skin:    Coloration: Skin is not pale.     Findings: No abrasion, erythema, petechiae or rash. Rash is not papular, urticarial or vesicular.  Neurological:     Mental Status: She is alert.  Psychiatric:        Behavior: Behavior is cooperative.      Diagnostic studies:   Spirometry: Normal FEV1, FVC, and FEV1/FVC ratio. There is no scooping suggestive of obstructive disease.    Allergy Studies:     Airborne Adult Perc - 10/16/22 1033     Time Antigen Placed 1015    Allergen Manufacturer Lavella Hammock    Location Back    Number of Test 59    1. Control-Buffer 50% Glycerol Negative    2. Control-Histamine 1 mg/ml 2+    3. Albumin saline Negative    4. Fannin Negative    5. [place] Negative    6. Johnson Negative    7. [place] Blue Negative    8. Meadow Fescue Negative    9. Perennial Rye Negative    10. Sweet Vernal Negative    11. Timothy Negative    12. Cocklebur Negative    13. Burweed Marshelder Negative    14. Ragweed, short Negative    15. Ragweed, Giant Negative    16. Plantain,  English Negative    17. Lamb's Quarters Negative    18. Sheep Sorrell Negative    19. Rough Pigweed Negative    20. Marsh Elder, Rough Negative    21. Mugwort, Common Negative    22. Ash mix Negative    23. Birch mix Negative    24. Beech American Negative    25. Box, Elder Negative    26. Cedar, red Negative    27. Cottonwood, [place] Negative    28. Elm mix Negative    29. Hickory Negative    30. Maple mix Negative    31. Oak, [place] mix Negative    32. Pecan Pollen Negative    33. Pine mix Negative    34. Sycamore Eastern Negative    35. Harris, Black Pollen Negative    36. Alternaria alternata Negative    37. Cladosporium Herbarum Negative    38. Aspergillus mix Negative    39. Penicillium mix Negative    40. Bipolaris sorokiniana (Helminthosporium) Negative    41. Drechslera spicifera (Curvularia) Negative    42. Mucor plumbeus Negative    43. Fusarium moniliforme Negative     44. Aureobasidium pullulans (pullulara) Negative    45. Rhizopus oryzae Negative    46. Botrytis cinera Negative    47. Epicoccum nigrum Negative    48. Phoma betae Negative    49. Candida Albicans Negative    50. Trichophyton mentagrophytes Negative    51. Mite, D Farinae  5,000 AU/ml Negative    52. Mite, D Pteronyssinus  5,000 AU/ml Negative    53. Cat Hair 10,000 BAU/ml Negative  54.  Dog Epithelia Negative    55. Mixed Feathers Negative    56. Horse Epithelia Negative    57. Cockroach, German Negative    58. Mouse Negative    59. Tobacco Leaf Negative             Intradermal - 10/16/22 1034     Time Antigen Placed 1045    Allergen Manufacturer Greer    Location Arm    Number of Test 15    Control Negative    [place] 3+    Johnson Negative    7 Grass 2+    Ragweed mix Negative    Weed mix Negative    Tree mix Negative    Mold 1 2+    Mold 2 3+    Mold 3 2+    Mold 4 2+    Cat Negative    Dog Negative    Cockroach 3+    Mite mix 2+             Allergy testing results were read and interpreted by myself, documented by clinical staff.         Salvatore Marvel, MD [facility]

## 2022-10-17 MED ORDER — FLUTICASONE PROPIONATE 50 MCG/ACT NA SUSP: 2.0000 | Freq: Every day | NASAL | 5 refills | Status: DC

## 2022-10-17 MED ORDER — AZELASTINE HCL 0.1 % NA SOLN: 2.0000 | Freq: Two times a day (BID) | NASAL | 5 refills | Status: DC

## 2022-10-17 NOTE — Telephone Encounter (Signed)
Split up the dymist into flonase and azelastine and sent into pts pharmacy

## 2022-10-17 NOTE — Telephone Encounter (Signed)
PA for Azelastine-Fluticasone 137-50MCG/ACT suspension has been DENIED due to:   you may not have tried, are: ipratropium bromide nasal spray.

## 2022-10-17 NOTE — Addendum Note (Signed)
Addended by: Felipa Emory on: 10/17/2022 10:37 AM   Modules accepted: Orders

## 2022-10-21 ENCOUNTER — Ambulatory Visit: Payer: Medicare HMO | Admitting: Internal Medicine

## 2022-10-29 ENCOUNTER — Encounter: Payer: Self-pay | Admitting: Adult Health

## 2022-10-29 ENCOUNTER — Ambulatory Visit: Payer: Medicare HMO | Admitting: Adult Health

## 2022-10-29 VITALS — BP 131/76 | HR 86 | Ht 64.0 in | Wt 153.5 lb

## 2022-10-29 DIAGNOSIS — N3941 Urge incontinence: Secondary | ICD-10-CM | POA: Diagnosis not present

## 2022-10-29 DIAGNOSIS — L292 Pruritus vulvae: Secondary | ICD-10-CM | POA: Diagnosis not present

## 2022-10-29 DIAGNOSIS — R350 Frequency of micturition: Secondary | ICD-10-CM

## 2022-10-29 LAB — POCT URINALYSIS DIPSTICK
Blood, UA: NEGATIVE
Glucose, UA: NEGATIVE
Ketones, UA: NEGATIVE
Leukocytes, UA: NEGATIVE
Nitrite, UA: NEGATIVE
Protein, UA: NEGATIVE

## 2022-10-29 MED ORDER — CLOTRIMAZOLE-BETAMETHASONE 1-0.05 % EX CREA
1.0000 | TOPICAL_CREAM | Freq: Two times a day (BID) | CUTANEOUS | 0 refills | Status: DC
Start: 2022-10-29 — End: 2023-01-14

## 2022-10-29 MED ORDER — MIRABEGRON ER 25 MG PO TB24: 25.0000 mg | ORAL_TABLET | Freq: Every day | ORAL | 0 refills | Status: DC

## 2022-10-29 NOTE — Progress Notes (Signed)
  Subjective:     Patient ID: [name], female   DOB: [birth year], 81 y.o.   MRN: [id number]  HPI [name] is an 81 year old white female,married, PM in complaining of urinary frequency, with urge incontinence, esp in am. and itching left vulva. Had some pain in right side but has gone. Her husband has dementia and has caregivers at home.  PCP is Dr Nevada Crane.  Review of Systems +urinary frequency +UI +itching left vulva    Reviewed past medical,surgical, social and family history. Reviewed medications and allergies.  Objective:   Physical Exam BP 131/76 (BP Location: Left Arm, Patient Position: Sitting, Cuff Size: Normal)   Pulse 86   Ht '5\' 4"'$  (1.626 m)   Wt 153 lb 8 oz (69.6 kg)   BMI 26.35 kg/m  Urine dipstick is negative. Skin warm and dry.Pelvic: external genitalia is normal in appearance no lesions, vagina:pale and atrophic, used small Pederson speculum,urethra has no lesions or masses noted, cervix:smooth, uterus: normal size, shape and contour, non tender, no masses felt, adnexa: no masses or tenderness noted. Bladder is non tender and no masses felt.      Upstream - 10/29/22 1525       Pregnancy Intention Screening   Does the patient want to become pregnant in the next year? N/A    Does the patient's partner want to become pregnant in the next year? N/A    Would the patient like to discuss contraceptive options today? N/A      Contraception Wrap Up   Current Method Female Sterilization    End Method Female Sterilization    Contraception Counseling Provided No            Examination chaperoned by Levy Pupa LPN  Assessment:     1. Urinary frequency +Urinary frequency esp in am with UI Will try myrbetiq 25 mg 1 daily samples given, discussed with Dr Elonda Husky  She will let me know if helps  2. Urge incontinence Will try myrbetiq  3. Vulvar itching Has itching for about 4 days, not bad today Will rx Lotrisone cream  Meds ordered this encounter  Medications    mirabegron ER (MYRBETRIQ) 25 MG TB24 tablet    Sig: Take 1 tablet (25 mg total) by mouth daily.    Dispense:  28 tablet    Refill:  0    Order Specific Question:   Supervising Provider    Answer:   Tania Ade H [[id number]]   clotrimazole-betamethasone (LOTRISONE) cream    Sig: Apply 1 Application topically 2 (two) times daily.    Dispense:  30 g    Refill:  0    Order Specific Question:   Supervising Provider    Answer:   Florian Buff [[id number]]       Plan:     Follow up prn

## 2022-10-30 ENCOUNTER — Telehealth: Payer: Self-pay

## 2022-10-30 NOTE — Telephone Encounter (Signed)
Pt wants to make sure it's ok for her to take Myrbetriq with her high blood pressure. I spoke with JAG. Myrbetriq can make BP go up a little but it shouldn't be much. Pt also has constipation issues and read that this med can cause constipation. Pt will try med and see how she does. [place]

## 2022-10-30 NOTE — Telephone Encounter (Signed)
Patient called and stated that she wants to talk with someone about the samples she got on yesterday.  Patient states that she has high blood pressure and wants to know if it is safe for her to take the medications.  Please call her on her home phone.

## 2022-11-08 ENCOUNTER — Telehealth: Payer: Self-pay | Admitting: Allergy & Immunology

## 2022-11-08 NOTE — Telephone Encounter (Signed)
I guess add her as a televisit.   Salvatore Marvel, MD [facility] [place]

## 2022-11-08 NOTE — Telephone Encounter (Signed)
[name] called in and states she is taking her meds as she as directed to.  She states she is feeling weak and tired.  She states she wakes up every morning and coughs for about 10 to 15 minutes and she ends up coughing up loose mucus that is dark yellow.  She states she coughs throughout the day but in the morning its at its roughest.  [name] states she cannot take singulair but is wondering if her current medications need to be adjusted.  Please advise.

## 2022-11-11 ENCOUNTER — Ambulatory Visit: Payer: Medicare HMO | Admitting: Internal Medicine

## 2022-11-11 ENCOUNTER — Encounter: Payer: Self-pay | Admitting: Internal Medicine

## 2022-11-11 VITALS — BP 134/90 | HR 83 | Temp 97.6°F | Resp 16

## 2022-11-11 DIAGNOSIS — J4530 Mild persistent asthma, uncomplicated: Secondary | ICD-10-CM

## 2022-11-11 DIAGNOSIS — J302 Other seasonal allergic rhinitis: Secondary | ICD-10-CM

## 2022-11-11 DIAGNOSIS — J3089 Other allergic rhinitis: Secondary | ICD-10-CM

## 2022-11-11 DIAGNOSIS — R0982 Postnasal drip: Secondary | ICD-10-CM

## 2022-11-11 DIAGNOSIS — R053 Chronic cough: Secondary | ICD-10-CM | POA: Diagnosis not present

## 2022-11-11 MED ORDER — FLUTICASONE PROPIONATE 50 MCG/ACT NA SUSP
2.0000 | Freq: Every day | NASAL | 5 refills | Status: DC
Start: 2022-11-11 — End: 2023-01-14

## 2022-11-11 MED ORDER — ALBUTEROL SULFATE HFA 108 (90 BASE) MCG/ACT IN AERS: 2.0000 | INHALATION_SPRAY | Freq: Four times a day (QID) | RESPIRATORY_TRACT | 1 refills | Status: DC | PRN

## 2022-11-11 MED ORDER — BUDESONIDE-FORMOTEROL FUMARATE 80-4.5 MCG/ACT IN AERO: 2.0000 | INHALATION_SPRAY | Freq: Two times a day (BID) | RESPIRATORY_TRACT | 5 refills | Status: DC

## 2022-11-11 MED ORDER — IPRATROPIUM BROMIDE 0.06 % NA SOLN: 2.0000 | Freq: Four times a day (QID) | NASAL | 5 refills | Status: DC | PRN

## 2022-11-11 MED ORDER — AZELASTINE HCL 0.1 % NA SOLN
2.0000 | Freq: Two times a day (BID) | NASAL | 5 refills | Status: DC
Start: 2022-11-11 — End: 2022-11-27

## 2022-11-11 MED ORDER — FEXOFENADINE HCL 180 MG PO TABS: 180.0000 mg | ORAL_TABLET | Freq: Every day | ORAL | 5 refills | Status: DC

## 2022-11-11 NOTE — Progress Notes (Signed)
FOLLOW UP Date of Service/Encounter:  11/11/22   Subjective:  [name] (DOB: 04-13-1941) is a 81 y.o. female who returns to the [facility] on 11/11/2022 for follow up an acute visit for chronic cough and drainage.  History obtained from: chart review and patient. Last seen Dr. Ernst Bowler 10/16/2022 for mild persistent asthma (followed by Dr. Melvyn Novas on Symbicort 70mg 2 puffs BID) and allergic rhinitis (Xyzal, Singulair, Dymista).    She has had a chronic cough and sore throat since Sept 2023.  She then went to the urgent care and was negative for COVID, flu, strep.  They told her to try at home care. She went to the PCP afterwards and was given prednisone without relief.  She then came to see Dr. GErnst Bowlerin early November.  Since the last visit, she has had persistent cough and it was initially yellow mucous and is now greenish. She also has a lot of post nasal drainage which triggers this cough.   She is not taking the Singulair because it caused leg swelling.  She is taking Xyzal '5mg'$  but it is making her very sleepy. She is using Flonase 2 SEN BID and Azelastine 2 SEN BID. She has had a CXR in 09/2022 that was normal.   Her asthma is doing well, she has not had SOB/wheezing.  She is taking Symbicort 869m 2 puffs BID.  No oral prednisone/ER visits/nighttime sxs since last visit.   Past Medical History: Past Medical History:  Diagnosis Date   Anemia    Anxiety    Arthritis    Asthma    Constipation    GERD (gastroesophageal reflux disease)    High blood pressure    History of COVID-19 12/28/2020   Hypothyroidism    PAD (peripheral artery disease) (HCC)    bilateral legs   Peripheral edema    Peripheral neuropathy    bilateral feet at night occ   Psoriasis    Seasonal allergies    Skin cancer    Left eye brow   Varicose veins    Vertigo 11/23/2013    Objective:  BP (!) 134/90   Pulse 83   Temp 97.6 F (36.4 C)   Resp 16   SpO2 98%  There is no  height or weight on file to calculate BMI. Physical Exam: GEN: alert, well developed HEENT: clear conjunctiva, TM grey and translucent, nose with moderate inferior turbinate hypertrophy, pink nasal mucosa, slight clear rhinorrhea, + cobblestoning HEART: regular rate and rhythm, no murmur LUNGS: clear to auscultation bilaterally, no coughing, unlabored respiration SKIN: no rashes or lesions  Assessment/Plan   1. Mild persistent asthma without complication - MDI technique discussed. Normal spirometry last visit 10/2022.  Follow up with Pulm, Dr. WeMelvyn Novas - Maintenance inhaler: continue Symbicort 8041m2 puffs twice daily.   - Rescue inhaler: Albuterol 2 puffs via spacer every 4-6 hours as needed for respiratory symptoms of cough, shortness of breath, or wheezing Asthma control goals:  Full participation in all desired activities (may need albuterol before activity) Albuterol use two times or less a week on average (not counting use with activity) Cough interfering with sleep two times or less a month Oral steroids no more than once a year No hospitalizations   2. Seasonal and perennial allergic rhinitis Chronic Cough with post nasal drainage, uncontrolled - SPT 10/2022 positive to grasses, indoor molds, outdoor molds, dust mites, and cockroach - Avoidance measures discussed.  - Use nasal saline rinses before  nose sprays such as with Neilmed Sinus Rinse.  Use distilled water.   - Use Flonase 2 sprays each nostril daily. Aim upward and outward. - Use Azelastine 2 sprays each nostril twice daily. Aim upward and outward.  - Use Ipratroprium 1-2 sprays up to three times daily as needed for runny nose. Aim upward and outward. - Use Allegra '180mg'$  daily. Stop Xyzal due to lethargy.  - Okay to stop Singulair.  Reports leg swelling with this.  - Consider allergy shots as long term control of your symptoms by teaching your immune system to be more tolerant of your allergy triggers  Keep follow up  in 11/27/2022.    Harlon Flor, MD  [facility]

## 2022-11-11 NOTE — Patient Instructions (Addendum)
1. Mild persistent asthma without complication - MDI technique discussed.   - Maintenance inhaler: continue Symbicort 58mg 2 puffs twice daily.   - Rescue inhaler: Albuterol 2 puffs via spacer every 4-6 hours as needed for respiratory symptoms of cough, shortness of breath, or wheezing Asthma control goals:  Full participation in all desired activities (may need albuterol before activity) Albuterol use two times or less a week on average (not counting use with activity) Cough interfering with sleep two times or less a month Oral steroids no more than once a year No hospitalizations   2. Seasonal and perennial allergic rhinitis Chronic Cough with post nasal drainage, uncontrolled - SPT 10/2022 positive to grasses, indoor molds, outdoor molds, dust mites, and cockroach - Avoidance measures discussed.  - Use nasal saline rinses before nose sprays such as with Neilmed Sinus Rinse.  Use distilled water.   - Use Flonase 2 sprays each nostril daily. Aim upward and outward. - Use Azelastine 2 sprays each nostril twice daily. Aim upward and outward.  - Use Ipratroprium 1-2 sprays up to three times daily as needed for runny nose. Aim upward and outward. - Use Allegra '180mg'$  daily. Stop Xyzal.  - Consider allergy shots as long term control of your symptoms by teaching your immune system to be more tolerant of your allergy triggers  Keep follow up in 11/27/2022.

## 2022-11-15 ENCOUNTER — Telehealth: Payer: Self-pay

## 2022-11-15 ENCOUNTER — Encounter: Payer: Self-pay | Admitting: Family Medicine

## 2022-11-15 ENCOUNTER — Ambulatory Visit (HOSPITAL_COMMUNITY)
Admission: RE | Admit: 2022-11-15 | Discharge: 2022-11-15 | Disposition: A | Discharge status: Home / Self Care | Payer: Medicare HMO | Source: Ambulatory Visit | Attending: Family Medicine | Admitting: Family Medicine

## 2022-11-15 ENCOUNTER — Other Ambulatory Visit: Payer: Self-pay

## 2022-11-15 ENCOUNTER — Ambulatory Visit (INDEPENDENT_AMBULATORY_CARE_PROVIDER_SITE_OTHER): Payer: Medicare HMO | Admitting: Family Medicine

## 2022-11-15 DIAGNOSIS — J4541 Moderate persistent asthma with (acute) exacerbation: Secondary | ICD-10-CM

## 2022-11-15 DIAGNOSIS — R052 Subacute cough: Secondary | ICD-10-CM | POA: Diagnosis not present

## 2022-11-15 DIAGNOSIS — J3089 Other allergic rhinitis: Secondary | ICD-10-CM

## 2022-11-15 DIAGNOSIS — R059 Cough, unspecified: Secondary | ICD-10-CM | POA: Diagnosis not present

## 2022-11-15 DIAGNOSIS — J302 Other seasonal allergic rhinitis: Secondary | ICD-10-CM

## 2022-11-15 DIAGNOSIS — J9811 Atelectasis: Secondary | ICD-10-CM | POA: Diagnosis not present

## 2022-11-15 MED ORDER — PREDNISONE 10 MG PO TABS: ORAL_TABLET | ORAL | 0 refills | Status: DC

## 2022-11-15 NOTE — Progress Notes (Signed)
RE: [name] MRN: [id number] DOB: [birth year] Date of Telemedicine Visit: 11/15/2022  Referring provider: [name], MD Primary care provider: [name], MD  Chief Complaint: Cough (Patient gave verbal consent to treat and bill insurance. )   Telemedicine Follow Up Visit via Telephone: I connected with [name] for a follow up on 11/17/22 by telephone and verified that I am speaking with the correct person using two identifiers.   I discussed the limitations, risks, security and privacy concerns of performing an evaluation and management service by telephone and the availability of in person appointments. I also discussed with the patient that there may be a patient responsible charge related to this service. The patient expressed understanding and agreed to proceed.  Patient is at home  Provider is at the office.  Visit start time: 206 Visit end time: [place] consent/check in by: [facility] consent and medical assistant/nurse: Caryl Pina  History of Present Illness: She is a 81 y.o. female, who is being followed for asthma, cough, and allergic rhinitis. Her previous allergy office visit was on 11/11/2022 with  Dr. Posey Pronto .  At today's visit, she reports that she has been experiencing cough producing sputum ranging in color from light yellow to green which has worsened over the last few days. She reports her asthma has been moderately well-controlled with occasional shortness of breath and cough which has begun producing thick dark green sputum over the last few days which is worse at night and in the morning.  She denies fever, sweats, chills, sick contacts, and sore throat.  She continues Symbicort 80-2 puffs twice a day with a spacer and rarely needs to use albuterol for rescue.  Allergic rhinitis is reported as moderately well controlled with symptoms including nasal congestion and copious postnasal drainage with frequent throat clearing.  She last had environmental  allergy skin testing on 10/16/2022 which was positive to grass pollen, molds, dust mite, and cockroach.  She continues Flonase, azelastine, ipratropium, and nasal saline rinses.  She takes Allegra daily and has stopped montelukast at this time.  She reports 1 barrier to getting allergy shots is caregiving for her husband and timing of respite caregivers.  Her current medications are listed in the chart.  Assessment and Plan: [name] is a 81 y.o. female with: Patient Instructions  Asthma Continue Symbicort 80-2 puffs twice a day to prevent cough or wheeze Continue albuterol 2 puffs once every 4 hours as needed for cough or wheeze You may use albuterol 2 puffs 5 to 15 minutes before activity to decrease cough or wheeze Continue to follow-up with your pulmonary specialist as recommended  Cough Get a chest x-ray We will call you as soon as results become available Prednisone 10 mg tablets. Take 2 tablets once a day for 4 days, then take 1 tablet on the 5th day, then stop.  This will help to relieve your symptoms while you are nasal medications are beginning to work  Allergic rhinitis Continue allergen avoidance measures directed toward pollens, molds, dust mite, and cockroach as listed below Continue azelastine 2 sprays in each nostril once or twice a day as needed for a runny nose Continue Flonas 2 sprays in each nostril once a day as needed for stuffy nose Continue ipratropium 2 sprays in each nostril twice a day as needed for a runny nose Only use Allegra as needed for runny nose or itch Begin Mucinex 600 to 1200 mg once or twice a day to thin mucus  Consider saline nasal rinses as needed for nasal symptoms. Use this before any medicated nasal sprays for best result  Reflux Continue dietary and lifestyle modifications as listed below Continue pantoprazole 40 mg once a day as previously prescribed.  Take this medication 30 minutes before your first meal for best results  Call the clinic if  this treatment plan is not working well for you.  Follow up in the clinic in 1 month or sooner if needed.  Return in about 4 weeks (around 12/13/2022), or if symptoms worsen or fail to improve.  Meds ordered this encounter  Medications   predniSONE (DELTASONE) 10 MG tablet    Sig: Prednisone 10 mg tablets. Take 2 tablets once a day for 4 days, then take 1 tablet on the 5th day, then stop.    Dispense:  9 tablet    Refill:  0   Lab Orders  No laboratory test(s) ordered today    Medication List:  Current Outpatient Medications  Medication Sig Dispense Refill   acetaminophen (TYLENOL) 500 MG tablet Take 500-1,000 mg by mouth as needed for mild pain or moderate pain.     albuterol (VENTOLIN HFA) 108 (90 Base) MCG/ACT inhaler Inhale 2 puffs into the lungs every 6 (six) hours as needed for wheezing or shortness of breath. 8 g 1   ALPRAZolam (XANAX) 0.5 MG tablet Take 0.5 mg by mouth in the morning, at noon, and at bedtime. (1000, 1500, AT BEDTIME)     aspirin EC 81 MG tablet Take 81 mg by mouth daily. Swallow whole.     azelastine (ASTELIN) 0.1 % nasal spray Place 2 sprays into both nostrils 2 (two) times daily. Use in each nostril as directed 30 mL 5   budesonide-formoterol (SYMBICORT) 80-4.5 MCG/ACT inhaler Inhale 2 puffs into the lungs 2 (two) times daily. 10.2 g 5   Cholecalciferol (VITAMIN D3) 50 MCG (2000 UT) capsule Take 2,000 Units by mouth daily.     clotrimazole-betamethasone (LOTRISONE) cream Apply 1 Application topically 2 (two) times daily. 30 g 0   diclofenac Sodium (VOLTAREN) 1 % GEL Apply 1 application topically as needed (back pain).     famotidine (PEPCID) 20 MG tablet One after supper 30 tablet 11   fexofenadine (ALLEGRA ALLERGY) 180 MG tablet Take 1 tablet (180 mg total) by mouth daily. 30 tablet 5   fluticasone (FLONASE) 50 MCG/ACT nasal spray Place 2 sprays into both nostrils daily. 16 g 5   ipratropium (ATROVENT) 0.06 % nasal spray Place 2 sprays into both nostrils 4  (four) times daily as needed for rhinitis. 15 mL 5   levothyroxine (SYNTHROID) 50 MCG tablet Take 1 tablet (50 mcg total) by mouth daily before breakfast. 90 tablet 3   lidocaine (XYLOCAINE) 2 % solution Use as directed 5 mLs in the mouth or throat as needed for mouth pain. Gargle and spit 64m up to three times daily as needed for throat pain. 100 mL 0   mirabegron ER (MYRBETRIQ) 25 MG TB24 tablet Take 1 tablet (25 mg total) by mouth daily. 28 tablet 0   Multiple Vitamin (MULTIVITAMIN WITH MINERALS) TABS tablet Take 1 tablet by mouth daily in the afternoon.     mupirocin ointment (BACTROBAN) 2 % APPLY TO AFFECTED AREA DAILY 22 g 0   pantoprazole (PROTONIX) 40 MG tablet TAKE 1 TABLET BY MOUTH ONCE A DAY. 30 tablet 11   polyethylene glycol (MIRALAX / GLYCOLAX) 17 g packet Take 17 g by mouth daily.  predniSONE (DELTASONE) 10 MG tablet Prednisone 10 mg tablets. Take 2 tablets once a day for 4 days, then take 1 tablet on the 5th day, then stop. 9 tablet 0   raloxifene (EVISTA) 60 MG tablet Take 1 tablet (60 mg total) by mouth every morning. (Patient taking differently: Take 60 mg by mouth every other day. IN THE MORNING) 30 tablet 12   venlafaxine XR (EFFEXOR-XR) 75 MG 24 hr capsule venlafaxine ER 75 mg capsule,extended release 24 hr     Chlorpheniramine-DM 4-30 MG TABS Take 1 tablet by mouth every 6 (six) hours as needed. (Patient not taking: Reported on 11/11/2022)     No current facility-administered medications for this visit.   Allergies: Allergies  Allergen Reactions   Amoxicillin-Pot Clavulanate Nausea And Vomiting, Other (See Comments), Itching and Nausea Only    Has patient had a PCN reaction causing immediate rash, facial/tongue/throat swelling, SOB or lightheadedness with hypotension: No Has patient had a PCN reaction causing severe rash involving mucus membranes or skin necrosis: No Has patient had a PCN reaction that required hospitalization: No Has patient had a PCN reaction  occurring within the last 10 years: No If all of the above answers are "NO", then may proceed with Cephalosporin use. Has patient had a PCN reaction causing immediate rash, facial/tongue/throat swelling, SOB or lightheadedness with hypotension: No Has patient had a PCN reaction causing severe rash involving mucus membranes or skin necrosis: No Has patient had a PCN reaction that required hospitalization: No Has patient had a PCN reaction occurring within the last 10 years: No If all of the above answers are "NO", then may proceed with Cephalosporin use.    Ace Inhibitors Cough   Guaifenesin Hives and Other (See Comments)   Lisinopril Cough and Other (See Comments)   Cetirizine    Entex La Itching   Gabapentin Other (See Comments)    Foggy head   Levofloxacin Hives and Itching   Meloxicam Other (See Comments)    (MOBIC) Unknown reaction type (maybe nausea?)   I reviewed her past medical history, social history, family history, and environmental history and no significant changes have been reported from previous visit on 11/11/2022.  Objective: Physical Exam Not obtained as encounter was done via telephone.   Previous notes and tests were reviewed.  I discussed the assessment and treatment plan with the patient. The patient was provided an opportunity to ask questions and all were answered. The patient agreed with the plan and demonstrated an understanding of the instructions.   The patient was advised to call back or seek an in-person evaluation if the symptoms worsen or if the condition fails to improve as anticipated.  I provided 21 minutes of non-face-to-face time during this encounter.  It was my pleasure to participate in [place] care today. Please feel free to contact me with any questions or concerns.   Sincerely,  Gareth Morgan, FNP

## 2022-11-15 NOTE — Progress Notes (Signed)
Patient notified of chest xray results. She will take a small prednisone taper as well as aggressive nasal treatments to thin and drain mucus secretions. She will call the clinic with worsening symptoms or if her symptoms do not resolve.

## 2022-11-15 NOTE — Telephone Encounter (Signed)
Patient called back and has been put on the schedule for a televisit with Webb Silversmith.

## 2022-11-15 NOTE — Telephone Encounter (Signed)
Sounds good - discussed with Webb Silversmith today.   Salvatore Marvel, MD [facility]

## 2022-11-15 NOTE — Telephone Encounter (Signed)
Patient was seen on Monday with Dr Posey Pronto. She is worse and coughing up Green mucous. Patient is wondering can she get started on an antibiotic?   [employer]

## 2022-11-17 ENCOUNTER — Encounter: Payer: Self-pay | Admitting: Family Medicine

## 2022-11-17 NOTE — Patient Instructions (Addendum)
Asthma Continue Symbicort 80-2 puffs twice a day to prevent cough or wheeze Continue albuterol 2 puffs once every 4 hours as needed for cough or wheeze You may use albuterol 2 puffs 5 to 15 minutes before activity to decrease cough or wheeze Continue to follow-up with your pulmonary specialist as recommended  Cough Get a chest x-ray We will call you as soon as results become available Prednisone 10 mg tablets. Take 2 tablets once a day for 4 days, then take 1 tablet on the 5th day, then stop.  This will help to relieve your symptoms while you are nasal medications are beginning to work  Allergic rhinitis Continue allergen avoidance measures directed toward pollens, molds, dust mite, and cockroach as listed below Continue azelastine 2 sprays in each nostril once or twice a day as needed for a runny nose Continue Flonas 2 sprays in each nostril once a day as needed for stuffy nose Continue ipratropium 2 sprays in each nostril twice a day as needed for a runny nose Only use Allegra as needed for runny nose or itch Begin Mucinex 600 to 1200 mg once or twice a day to thin mucus Consider saline nasal rinses as needed for nasal symptoms. Use this before any medicated nasal sprays for best result Consider allergen immunotherapy if your symptoms are not well-controlled with the treatment plan as listed above Consider sinus CT if your symptoms do not improve or if your symptoms worsen  Reflux Continue dietary and lifestyle modifications as listed below Continue pantoprazole 40 mg once a day as previously prescribed.  Take this medication 30 minutes before your first meal for best results  Call the clinic if this treatment plan is not working well for you.  Follow up in the clinic in 1 month or sooner if needed.

## 2022-11-26 NOTE — Patient Instructions (Incomplete)
Asthma Continue Symbicort 80-2 puffs twice a day to prevent cough or wheeze Continue albuterol 2 puffs once every 4 hours as needed for cough or wheeze You may use albuterol 2 puffs 5 to 15 minutes before activity to decrease cough or wheeze. Stop using your albuterol inhaler before using nasal sprays Continue to follow-up with your pulmonary specialist as recommended   Allergic rhinitis Continue allergen avoidance measures directed toward pollens, molds, dust mite, and cockroach as listed below Continue azelastine 2 sprays in each nostril once or twice a day as needed for a runny nose Continue Flonas 2 sprays in each nostril once a day as needed for stuffy nose Continue ipratropium 2 sprays in each nostril twice a day as needed for a runny nose Only use Allegra as needed for runny nose or itch Begin Mucinex 600 to 1200 mg once or twice a day to thin mucus Consider saline nasal rinses as needed for nasal symptoms. Use this before any medicated nasal sprays for best result Consider allergen immunotherapy if your symptoms are not well-controlled with the treatment plan as listed above Consider sinus CT if your symptoms do not improve or if your symptoms worsen  Reflux Continue dietary and lifestyle modifications as listed below Continue pantoprazole 40 mg once a day as previously prescribed.  Take this medication 30 minutes before your first meal for best results  Sinus infection Start doxycycline 100 mg taking twice a day for 7 days If you continue to have post nasal drip we will consider sinus imaging  Possible stye Recommend scheduling an appointment with your primary care physician or urgent care  Follow up in the clinic in 4 weeks or sooner if needed.

## 2022-11-27 ENCOUNTER — Encounter: Payer: Self-pay | Admitting: Nurse Practitioner

## 2022-11-27 ENCOUNTER — Other Ambulatory Visit: Payer: Self-pay

## 2022-11-27 ENCOUNTER — Encounter: Payer: Self-pay | Admitting: Family

## 2022-11-27 ENCOUNTER — Ambulatory Visit
Admission: EM | Admit: 2022-11-27 | Discharge: 2022-11-27 | Disposition: A | Discharge status: Home / Self Care | Payer: Medicare HMO | Attending: Nurse Practitioner | Admitting: Nurse Practitioner

## 2022-11-27 ENCOUNTER — Ambulatory Visit: Payer: Medicare HMO | Admitting: Family

## 2022-11-27 VITALS — BP 128/82 | HR 95 | Temp 97.3°F | Resp 18 | Ht 64.0 in | Wt 154.8 lb

## 2022-11-27 DIAGNOSIS — J019 Acute sinusitis, unspecified: Secondary | ICD-10-CM | POA: Diagnosis not present

## 2022-11-27 DIAGNOSIS — R053 Chronic cough: Secondary | ICD-10-CM | POA: Diagnosis not present

## 2022-11-27 DIAGNOSIS — R0982 Postnasal drip: Secondary | ICD-10-CM | POA: Diagnosis not present

## 2022-11-27 DIAGNOSIS — J3089 Other allergic rhinitis: Secondary | ICD-10-CM | POA: Diagnosis not present

## 2022-11-27 DIAGNOSIS — J302 Other seasonal allergic rhinitis: Secondary | ICD-10-CM

## 2022-11-27 DIAGNOSIS — J4541 Moderate persistent asthma with (acute) exacerbation: Secondary | ICD-10-CM | POA: Diagnosis not present

## 2022-11-27 DIAGNOSIS — H00014 Hordeolum externum left upper eyelid: Secondary | ICD-10-CM | POA: Diagnosis not present

## 2022-11-27 MED ORDER — AZELASTINE HCL 0.1 % NA SOLN: NASAL | 5 refills | Status: DC

## 2022-11-27 MED ORDER — ERYTHROMYCIN 5 MG/GM OP OINT: 1.0000 | TOPICAL_OINTMENT | Freq: Every day | OPHTHALMIC | 0 refills | Status: AC

## 2022-11-27 MED ORDER — DOXYCYCLINE MONOHYDRATE 100 MG PO TABS: 100.0000 mg | ORAL_TABLET | Freq: Two times a day (BID) | ORAL | 0 refills | Status: DC

## 2022-11-27 NOTE — Discharge Instructions (Signed)
Use medication as prescribed. Cool compresses to the eyes to help with swelling.  May apply warm compresses to help with any pain or discomfort. Strict handwashing when applying medication.  Avoid rubbing or manipulating the eyes while symptoms persist. Do not improve after this treatment, please follow-up with your primary care physician your eye doctor for further evaluation. Follow-up if symptoms do not improve.

## 2022-11-27 NOTE — ED Triage Notes (Signed)
Pt report having a bump in the left upper eyelid x 1 week. Pt think is a stye, as is swollen and red.   Pt will start doxycycline today for sinus infection, proscribed by her allergy Doctor.

## 2022-11-27 NOTE — Progress Notes (Signed)
[place], [address] [place] [address] Dept: [phone]  FOLLOW UP NOTE  Patient ID: [name], female    DOB: 12-31-1940  Age: 81 y.o. MRN: [id number] Date of Office Visit: 11/27/2022  Assessment  Chief Complaint: Asthma (6 wk f/u - better), Cough (6 wk f/u - better), and Allergic Rhinitis  (6wk f/u - better)  HPI [name] is an 81 year old female who presents today for follow-up of asthma, cough, allergic rhinitis, and reflux.  She was last seen via televisit on November 15, 2022 by Sylvia Morgan, FNP.  She denies any new diagnosis or surgery since her last office visit.  She does mention that 2 weeks ago she thinks she developed a stye in her left eye.  It is worse this morning.  She has been doing warm compressions 4 times a day.  She will either contact her primary care physician or go to urgent care.  She reports her vision has been a little bit blurry since her left eyelid has been droopy.  She denies eye pain, but mentions if she is touched area it can be painful, but it is not painful today.  She has noticed some more matter in her eye.  She denies double vision.  Asthma: She continues to take Symbicort 80/4.5 mcg 2 puffs twice a day as per her pulmonologist Dr. Melvyn French.  She also has albuterol to use as needed.  She reports that she has been using her albuterol before using her nose sprays as recommended by [name] at her last office visit.  Discussed with [name] that [name] did not want her to use her albuterol prior to using nasal sprays and to only use her albuterol as needed for cough, wheeze, tightness in chest, or shortness of breath.  She reports that her cough is not as bad but still there.  The cough is every day.  The cough all started in September with a terrible sore throat.  She has seen her primary care physician, urgent care, and Dr. Melvyn French for this cough.  She was told by Dr. Melvyn French that he felt like she needed to see allergy and that her lungs were fine.   When the cough all started the month of October what she was coughing up was yellow in color  and in November the sputum was dark green.  For the past 4 to 5 days it has been green in color again.  Prior to that it was yellow in color what she was coughing up.  The cough wakes her up at night.  She denies fever, chills, wheezing, tightness in her chest, and shortness of breath.  She reports that she has not ever been treated for a sinus infection, but wonders if she has a sinus infection.  It is her last office visit she has not required any trips to the emergency room or urgent care due to breathing problems.  She did finish the round of steroids that was given by [name] at the last office visit.  She did have a chest x-ray on November 15, 2022 showing: "No active cardiopulmonary disease.  Subsegmental atelectasis at the bases."  Allergic rhinitis: She reports that when she finished the Mucinex that was requested for her to use at the last office visit it really made her cough productive and when she finished the Mucinex it caused her to have a runny nose.  She has not had a runny nose for the past week.  She also reports  postnasal drip that she thinks is the cause of her coughing.  She denies nasal congestion.  She is using azelastine nasal spray twice a day, Flonase 2 sprays each nostril once a day and ipratropium bromide nasal spray up to 4 times a day.  She is not taking Allegra any.  She also uses a saline rinse daily.  Reflux is reported as controlled with pantoprazole 40 mg once a day.  She did mention that after eating asparagus she noticed stomach ache and heartburn for 2 to 3 days.  She took Tums and this went away.  She has been avoiding asparagus since.       Drug Allergies:  Allergies  Allergen Reactions   Amoxicillin-Pot Clavulanate Nausea And Vomiting, Other (See Comments), Itching and Nausea Only    Has patient had a PCN reaction causing immediate rash, facial/tongue/throat swelling,  SOB or lightheadedness with hypotension: No Has patient had a PCN reaction causing severe rash involving mucus membranes or skin necrosis: No Has patient had a PCN reaction that required hospitalization: No Has patient had a PCN reaction occurring within the last 10 years: No If all of the above answers are "NO", then may proceed with Cephalosporin use. Has patient had a PCN reaction causing immediate rash, facial/tongue/throat swelling, SOB or lightheadedness with hypotension: No Has patient had a PCN reaction causing severe rash involving mucus membranes or skin necrosis: No Has patient had a PCN reaction that required hospitalization: No Has patient had a PCN reaction occurring within the last 10 years: No If all of the above answers are "NO", then may proceed with Cephalosporin use.    Ace Inhibitors Cough   Guaifenesin Hives and Other (See Comments)   Lisinopril Cough and Other (See Comments)   Cetirizine    Entex La Itching   Gabapentin Other (See Comments)    Foggy head   Levofloxacin Hives and Itching   Meloxicam Other (See Comments)    (MOBIC) Unknown reaction type (maybe nausea?)    Review of Systems: Review of Systems  Constitutional:  Negative for chills and fever.  HENT:         Reports rhinorrhea when she finished the Mucinex, but then has not had rhinorrhea for a week when she stopped.  She also reports postnasal drip that she thinks is causing her cough.  She denies nasal congestion.  Eyes:        Denies itchy watery eyes  Respiratory:  Positive for cough. Negative for shortness of breath and wheezing.        Reports productive cough which has been green for the past 4 to 5 days.  Prior to that it was yellow.  Also reports cough occurs at night.  Denies wheezing, tightness in chest, and shortness of breath.  Cardiovascular:  Negative for chest pain and palpitations.  Gastrointestinal:        Reports 1 time having heartburn after eating asparagus.  Otherwise has not  had heartburn or reflux  Genitourinary:  Negative for frequency.  Skin:  Negative for itching and rash.  Neurological:  Positive for headaches.       Reports a headache this morning and took over-the-counter Tylenol  Endo/Heme/Allergies:  Positive for environmental allergies.     Physical Exam: BP 128/82   Pulse 95   Temp (!) 97.3 F (36.3 C)   Resp 18   Ht '5\' 4"'$  (1.626 m)   Wt 154 lb 12.8 oz (70.2 kg)   SpO2 95%   BMI  26.57 kg/m    Physical Exam Constitutional:      Appearance: Normal appearance.  HENT:     Head: Normocephalic and atraumatic.     Comments: Pharynx normal, eyes normal, ears normal, nose: Bilateral lower turbinates mildly edematous with no drainage noted    Right Ear: Tympanic membrane, ear canal and external ear normal.     Left Ear: Tympanic membrane, ear canal and external ear normal.     Mouth/Throat:     Mouth: Mucous membranes are moist.     Pharynx: Oropharynx is clear.  Eyes:     Conjunctiva/sclera: Conjunctivae normal.     Comments: Slightly erythematous papule with white area noted in the  center of papule on left eyelid.  No oozing, draining, discharge, or matter noted  Cardiovascular:     Rate and Rhythm: Regular rhythm.     Heart sounds: Normal heart sounds.  Pulmonary:     Effort: Pulmonary effort is normal.     Breath sounds: Normal breath sounds.     Comments: Lungs clear to auscultation Musculoskeletal:     Cervical back: Neck supple.  Skin:    General: Skin is warm.  Neurological:     Mental Status: She is alert and oriented to person, place, and time.  Psychiatric:        Mood and Affect: Mood normal.        Behavior: Behavior normal.        Thought Content: Thought content normal.        Judgment: Judgment normal.     Diagnostics: Not completed today  Assessment and Plan: 1. Acute non-recurrent sinusitis, unspecified location   2. Chronic cough   3. Seasonal and perennial allergic rhinitis   4. Moderate persistent  asthma with acute exacerbation   5. Post-nasal drainage     Meds ordered this encounter  Medications   doxycycline (ADOXA) 100 MG tablet    Sig: Take 1 tablet (100 mg total) by mouth 2 (two) times daily.    Dispense:  14 tablet    Refill:  0   azelastine (ASTELIN) 0.1 % nasal spray    Sig: Use 2 sprays in each nostril 1-2 times a day as needed for drainage down your throat/runny nose    Dispense:  30 mL    Refill:  5    Patient Instructions  Asthma Continue Symbicort 80-2 puffs twice a day to prevent cough or wheeze Continue albuterol 2 puffs once every 4 hours as needed for cough or wheeze You may use albuterol 2 puffs 5 to 15 minutes before activity to decrease cough or wheeze. Stop using your albuterol inhaler before using nasal sprays Continue to follow-up with your pulmonary specialist as recommended   Allergic rhinitis Continue allergen avoidance measures directed toward pollens, molds, dust mite, and cockroach as listed below Continue azelastine 2 sprays in each nostril once or twice a day as needed for a runny nose Continue Flonas 2 sprays in each nostril once a day as needed for stuffy nose Continue ipratropium 2 sprays in each nostril twice a day as needed for a runny nose Only use Allegra as needed for runny nose or itch Begin Mucinex 600 to 1200 mg once or twice a day to thin mucus Consider saline nasal rinses as needed for nasal symptoms. Use this before any medicated nasal sprays for best result Consider allergen immunotherapy if your symptoms are not well-controlled with the treatment plan as listed above Consider sinus CT if your  symptoms do not improve or if your symptoms worsen  Reflux Continue dietary and lifestyle modifications as listed below Continue pantoprazole 40 mg once a day as previously prescribed.  Take this medication 30 minutes before your first meal for best results  Sinus infection Start doxycycline 100 mg taking twice a day for 7 days If you  continue to have post nasal drip we will consider sinus imaging  Possible stye Recommend scheduling an appointment with your primary care physician or urgent care  Follow up in the clinic in 4 weeks or sooner if needed.  Return in about 4 weeks (around 12/25/2022), or if symptoms worsen or fail to improve.    Thank you for the opportunity to care for this patient.  Please do not hesitate to contact me with questions.  Althea Charon, FNP [facility]

## 2022-11-27 NOTE — ED Provider Notes (Signed)
[facility]    CSN: [id number] Arrival date & time: 11/27/22  1205      History   Chief Complaint Chief Complaint  Patient presents with   Eye Pain    HPI [name] is a 81 y.o. female.   The history is provided by the patient.   Patient presents for a 1 week history of swelling to the left upper eyelid.  Patient states "I thought it was a stye", but over the last several days, she states the area has turned white.  She states that the area was tender to touch, but that has since improved.  She states that she also has some blurriness when she looks from side-to-side.  She denies fever, chills, loss of vision,or eye drainage.  Patient reports that she just left her allergist office who diagnosed her with an acute sinusitis and prescribed her doxycycline.  She states she has been applying allergy eyedrops and warm compresses to the left eye. Past Medical History:  Diagnosis Date   Anemia    Anxiety    Arthritis    Asthma    Constipation    GERD (gastroesophageal reflux disease)    High blood pressure    History of COVID-19 12/28/2020   Hypothyroidism    PAD (peripheral artery disease) (HCC)    bilateral legs   Peripheral edema    Peripheral neuropathy    bilateral feet at night occ   Psoriasis    Seasonal allergies    Skin cancer    Left eye brow   Varicose veins    Vertigo 11/23/2013    Patient Active Problem List   Diagnosis Date Noted   Seasonal and perennial allergic rhinitis 10/16/2022   Gastritis and gastroduodenitis 06/14/2021   Gastric ulcer 04/13/2021   Abdominal pain 03/15/2021   Fracture of femoral neck, right ([place]) 01/31/2021   Closed displaced fracture of right femoral neck ([place]) 01/31/2021   Indigestion 11/02/2020   Change in stool caliber 11/02/2020   Loss of weight 08/23/2020   Nausea without vomiting 06/15/2020   Tick bite 04/10/2020   Medication refill 04/10/2020   Acquired trigger finger 03/28/2020   Pain in right  knee 10/08/2019   Acute maxillary sinusitis 08/20/2019   Cellulitis, leg 08/10/2019   Cellulitis 08/08/2019   Fever 08/08/2019   Cellulitis of foot 08/08/2019   Hypokalemia 08/08/2019   Hyponatremia 08/08/2019   Essential hypertension 08/08/2019   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   Adverse food reaction 09/24/2018   Mild persistent asthma without complication 97/35/3299   Pain of left hip joint 07/17/2018   Dyspnea on exertion 01/24/2017   Cough variant asthma  vs UACS 01/24/2017   High risk medication use 09/25/2016   Varicose veins of lower extremities with other complications 24/26/8341   Varicose veins 02/15/2014   Vertigo 11/23/2013   Pain in limb 10/04/2013   Hip bursitis 02/11/2013   Constipation 02/16/2009   RECTAL BLEEDING 02/16/2009   HIGH BLOOD PRESSURE 01/29/2008    Past Surgical History:  Procedure Laterality Date   APPENDECTOMY     patient unsure   BIOPSY  12/21/2020   Procedure: BIOPSY;  Surgeon: Daneil Dolin, MD;  [facility];  Service: Endoscopy;;   BIOPSY  05/28/2021   Procedure: BIOPSY;  Surgeon: Eloise Harman, DO;  [facility];  Service: Endoscopy;;   CATARACT EXTRACTION W/PHACO Left 01/27/2017   Procedure: CATARACT EXTRACTION PHACO AND INTRAOCULAR LENS PLACEMENT ([place]);  Surgeon: Tonny Branch,  MD;  Location: [facility];  Service: Ophthalmology;  Laterality: Left;  CDE: 29.90   CATARACT EXTRACTION W/PHACO Right 02/24/2017   Procedure: CATARACT EXTRACTION PHACO AND INTRAOCULAR LENS PLACEMENT ([facility]);  Surgeon: Tonny Branch, MD;  Location: [facility];  Service: Ophthalmology;  Laterality: Right;  CDE: 8.57   CHOLECYSTECTOMY     COLONOSCOPY N/A 10/01/2016   Procedure: COLONOSCOPY;  Surgeon: Daneil Dolin, MD; pancolonic diverticulosis, melanosis coli, otherwise normal exam.  No recommendations to repeat colonoscopy.   COLONOSCOPY WITH PROPOFOL N/A 12/21/2020   Rourk: Diverticulosis   ESOPHAGOGASTRODUODENOSCOPY (EGD) WITH PROPOFOL N/A  12/21/2020   Rourk: Erosive reflux esophagitis, nonbleeding gastric ulcers with biopsy showing erosion/ulceration, negative for H. pylori.   ESOPHAGOGASTRODUODENOSCOPY (EGD) WITH PROPOFOL N/A 05/28/2021   [place]: gastritis (reactive gastropathy), no H.pylori   FLEXOR TENDON REPAIR Right 11/06/2017   Procedure: RIGHT WRIST FLEXOR TENDON REPAIRAND STT DEBRIEDMENT;  Surgeon: Leanora Cover, MD;  Location: [facility];  Service: Orthopedics;  Laterality: Right;   MOUTH SURGERY     artery bleed   TOTAL HIP ARTHROPLASTY Right 01/31/2021   Procedure: TOTAL HIP ARTHROPLASTY ANTERIOR APPROACH;  Surgeon: Rod Can, MD;  Location: [facility];  Service: Orthopedics;  Laterality: Right;   TUBAL LIGATION      OB History     Gravida  2   Para  2   Term      Preterm      AB      Living  2      SAB      IAB      Ectopic      Multiple      Live Births  2            Home Medications    Prior to Admission medications   Medication Sig Start Date End Date Taking? Authorizing Provider  acetaminophen (TYLENOL) 500 MG tablet Take 500-1,000 mg by mouth as needed for mild pain or moderate pain.    [provider]  albuterol (VENTOLIN HFA) 108 (90 Base) MCG/ACT inhaler Inhale 2 puffs into the lungs every 6 (six) hours as needed for wheezing or shortness of breath. 11/11/22   Larose Kells, MD  ALPRAZolam Duanne Moron) 0.5 MG tablet Take 0.5 mg by mouth in the morning, at noon, and at bedtime. (1000, 1500, AT BEDTIME)    [provider]  aspirin EC 81 MG tablet Take 81 mg by mouth daily. Swallow whole.    [provider]  azelastine (ASTELIN) 0.1 % nasal spray Use 2 sprays in each nostril 1-2 times a day as needed for drainage down your throat/runny nose 11/27/22   Althea Charon, FNP  budesonide-formoterol [facility]) 80-4.5 MCG/ACT inhaler Inhale 2 puffs into the lungs 2 (two) times daily. 11/11/22   Larose Kells, MD  Cholecalciferol (VITAMIN D3) 50  MCG (2000 UT) capsule Take 2,000 Units by mouth daily.    [provider]  clotrimazole-betamethasone (LOTRISONE) cream Apply 1 Application topically 2 (two) times daily. 10/29/22   Estill Dooms, NP  diclofenac Sodium (VOLTAREN) 1 % GEL Apply 1 application topically as needed (back pain).    [provider]  doxycycline (ADOXA) 100 MG tablet Take 1 tablet (100 mg total) by mouth 2 (two) times daily. 11/27/22   Althea Charon, FNP  erythromycin ophthalmic ointment Place 1 Application into the left eye at bedtime for 7 days. Apply 1 inch ribbon to the left eye at bedtime for 7 days. 11/27/22 12/04/22 Yes  Palestine Mosco-Warren, Alda Lea, NP  famotidine (PEPCID) 20 MG tablet One after supper 08/01/22   Tanda Rockers, MD  fexofenadine [facility]) 180 MG tablet Take 1 tablet (180 mg total) by mouth daily. 11/11/22   Larose Kells, MD  fluticasone (FLONASE) 50 MCG/ACT nasal spray Place 2 sprays into both nostrils daily. 11/11/22   Larose Kells, MD  hydrALAZINE (APRESOLINE) 25 MG tablet Take 25 mg by mouth 2 (two) times daily. 11/18/22   [provider]  ipratropium (ATROVENT) 0.06 % nasal spray Place 2 sprays into both nostrils 4 (four) times daily as needed for rhinitis. 11/11/22   Larose Kells, MD  levothyroxine (SYNTHROID) 50 MCG tablet Take 1 tablet (50 mcg total) by mouth daily before breakfast. 03/26/22   Brita Romp, NP  lidocaine (XYLOCAINE) 2 % solution Use as directed 5 mLs in the mouth or throat as needed for mouth pain. Gargle and spit 46m up to three times daily as needed for throat pain. 09/28/22   Edder Bellanca-Warren, CAlda Lea NP  mirabegron ER (MYRBETRIQ) 25 MG TB24 tablet Take 1 tablet (25 mg total) by mouth daily. 10/29/22   GEstill Dooms NP  Multiple Vitamin (MULTIVITAMIN WITH MINERALS) TABS tablet Take 1 tablet by mouth daily in the afternoon.    [provider]  mupirocin ointment (BACTROBAN) 2 % APPLY TO AFFECTED AREA DAILY 03/07/22    WTrula Slade DPM  pantoprazole (PROTONIX) 40 MG tablet TAKE 1 TABLET BY MOUTH ONCE A DAY. 06/19/22   LMahala Menghini PA-C  polyethylene glycol (MIRALAX / GLYCOLAX) 17 g packet Take 17 g by mouth daily.    [provider]  raloxifene (EVISTA) 60 MG tablet Take 1 tablet (60 mg total) by mouth every morning. Patient taking differently: Take 60 mg by mouth every other day. IN THE MORNING 04/10/20   GEstill Dooms NP  venlafaxine XR (EFFEXOR-XR) 75 MG 24 hr capsule venlafaxine ER 75 mg capsule,extended release 24 hr    [provider]    Family History Family History  Problem Relation Age of Onset   Hypertension Mother    Varicose Veins Father    Heart attack Father    Heart disease Father    Hypertension Brother    COPD Brother     Social History Social History   Tobacco Use   Smoking status: Former    Packs/day: 0.25    Years: 5.00    Total pack years: 1.25    Types: Cigarettes    Quit date: 12/02/1997    Years since quitting: 25.0    Passive exposure: Past   Smokeless tobacco: Never  Vaping Use   Vaping Use: Never used  Substance Use Topics   Alcohol use: Not Currently    Alcohol/week: 1.0 standard drink of alcohol    Types: 1 Glasses of wine per week   Drug use: No     Allergies   Amoxicillin-pot clavulanate, Ace inhibitors, Guaifenesin, Lisinopril, Cetirizine, Entex la, Gabapentin, Levofloxacin, and Meloxicam   Review of Systems Review of Systems Per HPI  Physical Exam Triage Vital Signs ED Triage Vitals  Enc Vitals Group     BP 11/27/22 1312 (!) 175/113     Pulse Rate 11/27/22 1312 88     Resp 11/27/22 1312 18     Temp 11/27/22 1312 97.9 F (36.6 C)     Temp Source 11/27/22 1312 Oral     SpO2 11/27/22 1312 98 %  Weight --      Height --      Head Circumference --      Peak Flow --      Pain Score 11/27/22 1310 2     Pain Loc --      Pain Edu? --      Excl. in [place]? --    No data found.  Updated Vital Signs BP (!)  175/113 (BP Location: Right Arm)   Pulse 88   Temp 97.9 F (36.6 C) (Oral)   Resp 18   SpO2 98%   Visual Acuity Right Eye Distance:   Left Eye Distance:   Bilateral Distance:    Right Eye Near:   Left Eye Near:    Bilateral Near:     Physical Exam Vitals and nursing note reviewed.  Constitutional:      General: She is not in acute distress.    Appearance: Normal appearance.  HENT:     Head: Normocephalic.  Eyes:     General: Vision grossly intact.        Left eye: Hordeolum (left upper eyelid) present.No foreign body or discharge.     Extraocular Movements: Extraocular movements intact.     Left eye: Normal extraocular motion and no nystagmus.     Conjunctiva/sclera: Conjunctivae normal.     Pupils: Pupils are equal, round, and reactive to light.  Cardiovascular:     Rate and Rhythm: Normal rate and regular rhythm.     Pulses: Normal pulses.     Heart sounds: Normal heart sounds.  Pulmonary:     Effort: Pulmonary effort is normal.     Breath sounds: Normal breath sounds.  Abdominal:     General: Bowel sounds are normal.     Palpations: Abdomen is soft.  Musculoskeletal:     Cervical back: Normal range of motion.  Lymphadenopathy:     Cervical: No cervical adenopathy.  Skin:    General: Skin is warm and dry.  Neurological:     General: No focal deficit present.     Mental Status: She is alert and oriented to person, place, and time.  Psychiatric:        Mood and Affect: Mood normal.        Behavior: Behavior normal.      UC Treatments / Results  Labs (all labs ordered are listed, but only abnormal results are displayed) Labs Reviewed - No data to display  EKG   Radiology No results found.  Procedures Procedures (including critical care time)  Medications Ordered in UC Medications - No data to display  Initial Impression / Assessment and Plan / UC Course  I have reviewed the triage vital signs and the nursing notes.  Pertinent labs & imaging  results that were available during my care of the patient were reviewed by me and considered in my medical decision making (see chart for details).  The patient is well-appearing, she is no acute distress, vital signs are stable.   Patient with swelling and redness to the left upper eyelid, induration is noted, consistent with hordeolum.  Patient is starting doxycycline for an acute sinusitis, advised that this medication is appropriate for treatment of her eye.  Advised patient that if symptoms do not improve, prescription for erythromycin ophthalmic ointment has also been sent to her pharmacy to begin.  Supportive care recommendations were provided to the patient along with indications for follow-up.  Patient verbalizes understanding.  All questions were answered.  Patient is stable  for discharge.  Final Clinical Impressions(s) / UC Diagnoses   Final diagnoses:  Hordeolum externum left upper eyelid     Discharge Instructions      Use medication as prescribed. Cool compresses to the eyes to help with swelling.  May apply warm compresses to help with any pain or discomfort. Strict handwashing when applying medication.  Avoid rubbing or manipulating the eyes while symptoms persist. Do not improve after this treatment, please follow-up with your primary care physician your eye doctor for further evaluation. Follow-up if symptoms do not improve.      ED Prescriptions     Medication Sig Dispense Auth. Provider   erythromycin ophthalmic ointment Place 1 Application into the left eye at bedtime for 7 days. Apply 1 inch ribbon to the left eye at bedtime for 7 days. 7 g Lou Irigoyen-Warren, Alda Lea, NP      PDMP not reviewed this encounter.   Tish Men, NP 11/27/22 1344

## 2022-12-09 ENCOUNTER — Telehealth: Payer: Self-pay | Admitting: Gastroenterology

## 2022-12-09 ENCOUNTER — Ambulatory Visit: Payer: Medicare HMO | Admitting: Gastroenterology

## 2022-12-09 DIAGNOSIS — M25561 Pain in right knee: Secondary | ICD-10-CM | POA: Diagnosis not present

## 2022-12-09 DIAGNOSIS — H903 Sensorineural hearing loss, bilateral: Secondary | ICD-10-CM | POA: Diagnosis not present

## 2022-12-09 DIAGNOSIS — M25562 Pain in left knee: Secondary | ICD-10-CM | POA: Diagnosis not present

## 2022-12-09 NOTE — Telephone Encounter (Signed)
Patient came into the office thinking she had an appointment.  She did actually for 11:30 today but it was cancelled via the automated system.  Pt look confused as to why it had been cancelled.  I offered other appointments but she already had another appt in [facility] for this afternoon.  She said that she had 2 questions that she really just wanted to ask [place] about.  She would like for you to call her on Tuesday because she won't be going anywhere due to the weather and to call her home number.

## 2022-12-10 NOTE — Telephone Encounter (Signed)
Spoke with pt regarding her follow up appt. Pt has been rescheduled for 01/13/2023

## 2022-12-12 DIAGNOSIS — H04123 Dry eye syndrome of bilateral lacrimal glands: Secondary | ICD-10-CM | POA: Diagnosis not present

## 2022-12-23 DIAGNOSIS — H903 Sensorineural hearing loss, bilateral: Secondary | ICD-10-CM | POA: Diagnosis not present

## 2022-12-25 ENCOUNTER — Ambulatory Visit: Payer: Medicare HMO | Admitting: Allergy & Immunology

## 2022-12-25 ENCOUNTER — Encounter: Payer: Self-pay | Admitting: Allergy & Immunology

## 2022-12-25 VITALS — BP 148/100 | HR 95 | Temp 97.6°F | Resp 16

## 2022-12-25 DIAGNOSIS — L03119 Cellulitis of unspecified part of limb: Secondary | ICD-10-CM | POA: Diagnosis not present

## 2022-12-25 DIAGNOSIS — B999 Unspecified infectious disease: Secondary | ICD-10-CM | POA: Diagnosis not present

## 2022-12-25 DIAGNOSIS — J324 Chronic pansinusitis: Secondary | ICD-10-CM | POA: Diagnosis not present

## 2022-12-25 MED ORDER — CLARITHROMYCIN 500 MG PO TABS: 500.0000 mg | ORAL_TABLET | Freq: Two times a day (BID) | ORAL | 0 refills | Status: AC

## 2022-12-25 NOTE — Patient Instructions (Addendum)
1. Mild persistent asthma without complication - Lung testing not done since you were sick,  - Dr. Melvyn Novas seems to have this under good control. - Continue with the Symbicort two puffs TWICE DAILY.   2. Seasonal and perennial allergic rhinitis - Previous testing showed: grasses, indoor molds, outdoor molds, dust mites, and cockroach - Continue with:  Xyzal (levocetirizine) '5mg'$  tablet OR Allegra '180mg'$  tablet Singulair (montelukast) '10mg'$  daily Flonase (fluticasone) one spray per nostril TWICE daily Astelin (azelastine) 2 sprays per nostril TWICE daily  Atrovent (ipratropium) 2 sprays in each nostril EVERY 8 hours as needed - Consider allergy shots as a means of long-term control. - Allergy shots "re-train" and "reset" the immune system to ignore environmental allergens and decrease the resulting immune response to those allergens (sneezing, itchy watery eyes, runny nose, nasal congestion, etc).    - Allergy shots improve symptoms in 75-85% of patients.  - We can discuss more at the next appointment if the medications are not working for you.  3. Recurrent infections - We are starting clarithromycin twice daily for 7 days.  - We will obtain some screening labs to evaluate your immune system.  - Labs to evaluate the quantitative [facility]) aspects of your immune system: IgG/IgA/IgM, CBC with differential - Labs to evaluate the qualitative ([place]) aspects of your immune system: CH50, Pneumococcal titers, Tetanus titers, Diphtheria titers - We may consider immunizations with Pneumovax and Tdap to challenge your immune system, and then obtain repeat titers in 4-6 weeks.  - We are also ordering a sinus CT to look at your nasal cavities and see if there is something surgical that needs to happen.   4. Return in about 3 months (around 03/26/2023).    Please inform us of any Emergency Department visits, hospitalizations, or changes in symptoms. Call us before going to the ED for breathing  or allergy symptoms since we might be able to fit you in for a sick visit. Feel free to contact us anytime with any questions, problems, or concerns.  It was a pleasure to meet you today! You are a DOLL!   Websites that have reliable patient information: 1. American Academy of Asthma, Allergy, and Immunology: www.aaaai.org 2. Food Allergy Research and Education (FARE): foodallergy.org 3. Mothers of Asthmatics: http://www.asthmacommunitynetwork.org 4. American College of Allergy, Asthma, and Immunology: www.acaai.org   COVID-19 Vaccine Information can be found at: [email or web address].[email or web address].uk For questions related to vaccine distribution or appointments, please email [email or web address] or call [phone].   We realize that you might be concerned about having an allergic reaction to the COVID19 vaccines. To help with that concern, WE ARE OFFERING THE COVID19 VACCINES IN OUR OFFICE! Ask the front desk for dates!     "Like" [place] on Facebook and Instagram for our latest updates!      A healthy democracy works best when [employer] participate! Make sure you are registered to vote! If you have moved or changed any of your contact information, you will need to get this updated before voting!  In some cases, you MAY be able to register to vote online: [email or web address]

## 2022-12-25 NOTE — Progress Notes (Signed)
FOLLOW UP  Date of Service/Encounter:  12/25/22   Assessment:   Mild persistent asthma without complication   Seasonal and perennial allergic rhinitis (grasses, indoor molds, outdoor molds, dust mites, and cockroach) - would likely benefit from allergen immunotherapy, although her duties at home taking care of her husband likely keep that from being an effective or easy treatment at this point in time   Remote history of smoking   Social stressors at home - husband with Alzheimers     Plan/Recommendations:   1. Mild persistent asthma without complication - Lung testing not done since you were sick,  - Dr. Melvyn Novas seems to have this under good control. - Continue with the Symbicort two puffs TWICE DAILY.   2. Seasonal and perennial allergic rhinitis - Previous testing showed: grasses, indoor molds, outdoor molds, dust mites, and cockroach - Continue with:  Xyzal (levocetirizine) '5mg'$  tablet OR Allegra '180mg'$  tablet Singulair (montelukast) '10mg'$  daily Flonase (fluticasone) one spray per nostril TWICE daily Astelin (azelastine) 2 sprays per nostril TWICE daily  Atrovent (ipratropium) 2 sprays in each nostril EVERY 8 hours as needed - Consider allergy shots as a means of long-term control. - Allergy shots "re-train" and "reset" the immune system to ignore environmental allergens and decrease the resulting immune response to those allergens (sneezing, itchy watery eyes, runny nose, nasal congestion, etc).    - Allergy shots improve symptoms in 75-85% of patients.  - We can discuss more at the next appointment if the medications are not working for you.  3. Recurrent infections - We are starting clarithromycin twice daily for 7 days.  - We will obtain some screening labs to evaluate your immune system.  - Labs to evaluate the quantitative [facility]) aspects of your immune system: IgG/IgA/IgM, CBC with differential - Labs to evaluate the qualitative ([place]) aspects of your  immune system: CH50, Pneumococcal titers, Tetanus titers, Diphtheria titers - We may consider immunizations with Pneumovax and Tdap to challenge your immune system, and then obtain repeat titers in 4-6 weeks.  - We are also ordering a sinus CT to look at your nasal cavities and see if there is something surgical that needs to happen.   4. Return in about 3 months (around 03/26/2023).   Subjective:   [name] is a 82 y.o. female presenting today for follow up of  Chief Complaint  Patient presents with   Follow-up    Has been congested and has had a sore throat that hurts to swallow. Has been having a cough and has been green phlegm. Took mucinex yesterday and has not taken one this morning.     [name] has a history of the following: Patient Active Problem List   Diagnosis Date Noted   Seasonal and perennial allergic rhinitis 10/16/2022   Gastritis and gastroduodenitis 06/14/2021   Gastric ulcer 04/13/2021   Abdominal pain 03/15/2021   Fracture of femoral neck, right ([place]) 01/31/2021   Closed displaced fracture of right femoral neck ([place]) 01/31/2021   Indigestion 11/02/2020   Change in stool caliber 11/02/2020   Loss of weight 08/23/2020   Nausea without vomiting 06/15/2020   Tick bite 04/10/2020   Medication refill 04/10/2020   Acquired trigger finger 03/28/2020   Pain in right knee 10/08/2019   Acute maxillary sinusitis 08/20/2019   Cellulitis, leg 08/10/2019   Cellulitis 08/08/2019   Fever 08/08/2019   Cellulitis of foot 08/08/2019   Hypokalemia 08/08/2019   Hyponatremia 08/08/2019  Essential hypertension 08/08/2019   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  [name] is a 82 y.o. female presenting for a sick visit.  She was last seen in December 2023.  At that time, we continue with Symbicort 80 mcg 2 puffs twice daily as well as albuterol as needed.  For allergic rhinitis, we continue with Astelin as well as Flonase and ipratropium.  Reflux was controlled with pantoprazole 40 mg daily.  She was started on doxycycline for 7 days to treat a sinus infection.  Prior to that, she got a round of prednisone via the televisit with [place].  Since last visit, she mostly did well. She did get better a few weeks ago. She was brathing well without any of the problems that she was experiencing. She felt good for two weeks and then all of this came back. Yesterday morning, she wokeup and was hoarse. She could not talk yesterday and she had a lot of throat pain.  She does report some sinus pain and pressure.  Asthma/Respiratory Symptom History: She remains on the Symbicort 2 puffs twice daily.  She is not sure when her appointment with Dr. Melvyn Novas is, but it might be next month sometime.  She did go a couple of weeks without using her albuterol, but is using it more right now.  Of note, things are getting worse at home.  Hospice is involved with her husband.  They are getting a hospital bed.  He is really decompensated quite a bit in the last month.  She does have a lot of anxiety over this and is on Xanax 3 times a day.  Allergic Rhinitis Symptom History: She remains on her Xyzal, Singulair, and 3 nasal sprays.  She tends to use the Flonase in the morning and Astelin at night.  She also has the ipratropium that she uses intermittently through the day.   She has never had an immune workup done.  She has been  getting sick more recently over the last few months.  These are mostly sinopulmonary infections.  Her vaccinations are all up to date.    Otherwise, there have been no changes to her past medical history, surgical history, family history, or social history.    Review of Systems  Constitutional:  Negative for chills, fever and weight loss.  HENT:  Positive for congestion, sinus pain and sore throat.        Reports rhinorrhea when she finished the Mucinex, but then has not had rhinorrhea for a week when she stopped.  She also reports postnasal drip that she thinks is causing her cough.  She denies nasal congestion.  Eyes:        Denies itchy watery eyes  Respiratory:  Negative for cough, shortness of breath and wheezing.        Reports productive cough which has been green for the past 4 to 5 days.  Prior to that it was  yellow.  Also reports cough occurs at night.  Denies wheezing, tightness in chest, and shortness of breath.  Cardiovascular:  Negative for chest pain and palpitations.  Gastrointestinal:  Negative for abdominal pain, constipation, diarrhea, nausea and vomiting.       Reports 1 time having heartburn after eating asparagus.  Otherwise has not had heartburn or reflux  Genitourinary:  Negative for frequency.  Skin:  Negative for itching and rash.  Neurological:  Positive for headaches. Negative for dizziness.       Reports a headache this morning and took over-the-counter Tylenol  Endo/Heme/Allergies:  Positive for environmental allergies. Does not bruise/bleed easily.       Objective:   Blood pressure (!) 148/100, pulse 95, temperature 97.6 F (36.4 C), resp. rate 16, SpO2 98 %. There is no height or weight on file to calculate BMI.    Physical Exam Constitutional:      Appearance: She is well-developed.  HENT:     Head: Normocephalic and atraumatic.     Right Ear: Tympanic membrane, ear canal and external ear normal.     Left Ear: Tympanic membrane, ear canal and  external ear normal.     Nose: Congestion present. No nasal deformity, septal deviation, mucosal edema or rhinorrhea.     Right Turbinates: Enlarged, swollen and pale.     Left Turbinates: Enlarged, swollen and pale.     Right Sinus: No maxillary sinus tenderness or frontal sinus tenderness.     Left Sinus: No maxillary sinus tenderness or frontal sinus tenderness.     Mouth/Throat:     Mouth: Mucous membranes are not pale and not dry.     Pharynx: Uvula midline.  Eyes:     General: Lids are normal. No allergic shiner.       Right eye: No discharge.        Left eye: No discharge.     Conjunctiva/sclera: Conjunctivae normal.     Right eye: Right conjunctiva is not injected. No chemosis.    Left eye: Left conjunctiva is not injected. No chemosis.    Pupils: Pupils are equal, round, and reactive to light.  Cardiovascular:     Rate and Rhythm: Normal rate and regular rhythm.     Heart sounds: Normal heart sounds.  Pulmonary:     Effort: Pulmonary effort is normal. No tachypnea, accessory muscle usage or respiratory distress.     Breath sounds: Normal breath sounds. No wheezing, rhonchi or rales.  Chest:     Chest wall: No tenderness.  Lymphadenopathy:     Cervical: No cervical adenopathy.  Skin:    Coloration: Skin is not pale.     Findings: No abrasion, erythema, petechiae or rash. Rash is not papular, urticarial or vesicular.  Neurological:     Mental Status: She is alert.  Psychiatric:        Behavior: Behavior is cooperative.      Diagnostic studies: none    Salvatore Marvel, MD  [facility] [place]

## 2022-12-26 ENCOUNTER — Other Ambulatory Visit (HOSPITAL_COMMUNITY): Payer: Self-pay

## 2022-12-26 ENCOUNTER — Telehealth: Payer: Self-pay

## 2022-12-26 DIAGNOSIS — B999 Unspecified infectious disease: Secondary | ICD-10-CM | POA: Diagnosis not present

## 2022-12-26 NOTE — Telephone Encounter (Signed)
Called and spoke with the patient and advised of approval. Recommended she wait about an hour and call back to the pharmacy to see if it is available for pickup and if any issues to please call us back. Patient verbalized understanding.

## 2022-12-26 NOTE — Telephone Encounter (Signed)
Patient Advocate Encounter  Prior Authorization for Clarithromycin '500MG'$  tablets has been approved.    [name]   Effective: 12-02-2022 to 12-02-2023

## 2022-12-26 NOTE — Telephone Encounter (Signed)
I wonder why a PA was needed. Thanks for taking care of that!   Salvatore Marvel, MD [facility] [place] [facility] [place]

## 2022-12-26 NOTE — Telephone Encounter (Signed)
Patient went to pick up medication but was told prescription had not been sent to pharmacy.   Advised our office did send in prescription yesterday to [facility]. Advised patient a prior authorization needed to be completed, as the insurance is wanting more information to cover medication.   Patient states she was seen due to a sinus infection. Advised patient I would send message to provider to see if something else could be sent in.   Also advised patient prior authorization had been started in the meantime.   Please advise on changing prescription for patient.   Best contact number: [phone]

## 2022-12-26 NOTE — Telephone Encounter (Signed)
PA request received via CMM for Clarithromycin '500MG'$  tablets  PA has been submitted to [facility] and is awaiting determination.   Key: [id number]

## 2022-12-30 ENCOUNTER — Other Ambulatory Visit (HOSPITAL_COMMUNITY): Payer: Self-pay | Admitting: Family Medicine

## 2022-12-30 DIAGNOSIS — R2689 Other abnormalities of gait and mobility: Secondary | ICD-10-CM | POA: Diagnosis not present

## 2022-12-30 DIAGNOSIS — R9431 Abnormal electrocardiogram [ECG] [EKG]: Secondary | ICD-10-CM | POA: Diagnosis not present

## 2022-12-30 DIAGNOSIS — R600 Localized edema: Secondary | ICD-10-CM | POA: Diagnosis not present

## 2022-12-31 LAB — CBC WITH DIFFERENTIAL
Basophils Absolute: 0.1 10*3/uL (ref 0.0–0.2)
Basos: 1 %
EOS (ABSOLUTE): 0.2 10*3/uL (ref 0.0–0.4)
Eos: 2 %
Hematocrit: 33.6 % — ABNORMAL LOW (ref 34.0–46.6)
Hemoglobin: 11 g/dL — ABNORMAL LOW (ref 11.1–15.9)
Immature Grans (Abs): 0 10*3/uL (ref 0.0–0.1)
Immature Granulocytes: 0 %
Lymphocytes Absolute: 2 10*3/uL (ref 0.7–3.1)
Lymphs: 22 %
MCH: 30.2 pg (ref 26.6–33.0)
MCHC: 32.7 g/dL (ref 31.5–35.7)
MCV: 92 fL (ref 79–97)
Monocytes Absolute: 0.8 10*3/uL (ref 0.1–0.9)
Monocytes: 9 %
Neutrophils Absolute: 6.2 10*3/uL (ref 1.4–7.0)
Neutrophils: 66 %
RBC: 3.64 x10E6/uL — ABNORMAL LOW (ref 3.77–5.28)
RDW: 14.7 % (ref 11.7–15.4)
WBC: 9.4 10*3/uL (ref 3.4–10.8)

## 2022-12-31 LAB — STREP PNEUMONIAE 23 SEROTYPES IGG
Pneumo Ab Type 1*: >14.2 ug/mL (ref >1.3)
Pneumo Ab Type 12 (12F)*: 0.4 ug/mL — ABNORMAL LOW (ref >1.3)
Pneumo Ab Type 14*: 1.2 ug/mL — ABNORMAL LOW (ref >1.3)
Pneumo Ab Type 17 (17F)*: >20.2 ug/mL (ref >1.3)
Pneumo Ab Type 19 (19F)*: >30.3 ug/mL (ref >1.3)
Pneumo Ab Type 2*: >20.7 ug/mL (ref >1.3)
Pneumo Ab Type 20*: 11.4 ug/mL (ref >1.3)
Pneumo Ab Type 22 (22F)*: 4.9 ug/mL (ref >1.3)
Pneumo Ab Type 23 (23F)*: 4.5 ug/mL (ref >1.3)
Pneumo Ab Type 26 (6B)*: 3.9 ug/mL (ref >1.3)
Pneumo Ab Type 3*: >37.4 ug/mL (ref >1.3)
Pneumo Ab Type 34 (10A)*: 1.3 ug/mL — ABNORMAL LOW (ref >1.3)
Pneumo Ab Type 4*: >8.3 ug/mL (ref >1.3)
Pneumo Ab Type 43 (11A)*: 0.5 ug/mL — ABNORMAL LOW (ref >1.3)
Pneumo Ab Type 5*: 1.3 ug/mL — ABNORMAL LOW (ref >1.3)
Pneumo Ab Type 51 (7F)*: >13.6 ug/mL (ref >1.3)
Pneumo Ab Type 54 (15B)*: 2.2 ug/mL (ref >1.3)
Pneumo Ab Type 56 (18C)*: >8.1 ug/mL (ref >1.3)
Pneumo Ab Type 57 (19A)*: >33.6 ug/mL (ref >1.3)
Pneumo Ab Type 68 (9V)*: 1.2 ug/mL — ABNORMAL LOW (ref >1.3)
Pneumo Ab Type 70 (33F)*: >10.1 ug/mL (ref >1.3)
Pneumo Ab Type 8*: >25.3 ug/mL (ref >1.3)
Pneumo Ab Type 9 (9N)*: 1.9 ug/mL (ref >1.3)

## 2022-12-31 LAB — IGG, IGA, IGM
IgA/Immunoglobulin A, Serum: 209 mg/dL (ref 64–422)
IgG (Immunoglobin G), Serum: 942 mg/dL (ref 586–1602)
IgM (Immunoglobulin M), Srm: 33 mg/dL (ref 26–217)

## 2022-12-31 LAB — DIPHTHERIA / TETANUS ANTIBODY PANEL
Diphtheria Ab: 1.21 IU/mL (ref <0.10)
Tetanus Ab, IgG: >7 IU/mL (ref <0.10)

## 2022-12-31 LAB — COMPLEMENT, TOTAL: Compl, Total (CH50): >60 U/mL (ref >41)

## 2023-01-06 ENCOUNTER — Telehealth: Payer: Self-pay | Admitting: Allergy & Immunology

## 2023-01-06 NOTE — Telephone Encounter (Signed)
[name] called in and states she has finished the antibiotic that was prescribed by Dr. Ernst Bowler and now has thrush.  Geetika states it is in her mouth, throat, and tongue.  She would like something called in to [facility].

## 2023-01-07 MED ORDER — NYSTATIN 100000 UNIT/ML MT SUSP: 5.0000 mL | Freq: Four times a day (QID) | OROMUCOSAL | 0 refills | Status: DC

## 2023-01-07 NOTE — Telephone Encounter (Signed)
I will send in nystatin four times daily for one week. Please let the patient know.  Salvatore Marvel, MD [facility] [place] [facility] [place]

## 2023-01-09 ENCOUNTER — Ambulatory Visit (HOSPITAL_COMMUNITY)
Admission: RE | Admit: 2023-01-09 | Discharge: 2023-01-09 | Disposition: A | Discharge status: Home / Self Care | Payer: Medicare HMO | Source: Ambulatory Visit | Attending: Family Medicine | Admitting: Family Medicine

## 2023-01-09 DIAGNOSIS — R600 Localized edema: Secondary | ICD-10-CM | POA: Diagnosis not present

## 2023-01-13 ENCOUNTER — Ambulatory Visit: Payer: Medicare HMO | Admitting: Gastroenterology

## 2023-01-13 NOTE — Progress Notes (Unsigned)
GI Office Note    Referring Provider: [name], MD Primary Care Physician:  [name], MD  Primary Gastroenterologist: Garfield Cornea, MD   Chief Complaint   No chief complaint on file.   History of Present Illness   [name] is a 82 y.o. female presenting today for follow up. Last seen in 05/2022.   History of GERD, gastric ulcers, constipation.     EGD 12/2020:  -erosive reflux esophagitis -nonbleeding gastric ulcers, biopsy showed erosion/ulceration, negative for H. Pylori   Colonoscopy January 2022: -Diverticulosis   CT abdomen pelvis February 2022 with subacute impaction of the right subcapital femoral neck, diverticulosis without diverticulitis, absent gallbladder.  Patient was referred to orthopedic.  She had a hip replacement and was told by Ortho that her pain had nothing to do with her hip replacement.   CT abdomen pelvis March 15, 2021 after being referred to the ED from our office for abdominal pain, stable intra and extrahepatic biliary duct dilation likely related to prior cholecystectomy, postsurgical changes seen from interval right hip arthroplasty.  Postoperative seroma anterior to the right hip prosthesis measuring 5.1 x 2.8 x 6.6 cm.   EGD 05/2021: -Gastritis. Biopsied. Reactive gastropathy, Neg for H.pylori -Normal duodenal bulb, first portion of the duodenum and second portion of the duodenum.            Medications   Current Outpatient Medications  Medication Sig Dispense Refill   acetaminophen (TYLENOL) 500 MG tablet Take 500-1,000 mg by mouth as needed for mild pain or moderate pain.     albuterol (VENTOLIN HFA) 108 (90 Base) MCG/ACT inhaler Inhale 2 puffs into the lungs every 6 (six) hours as needed for wheezing or shortness of breath. 8 g 1   ALPRAZolam (XANAX) 0.5 MG tablet Take 0.5 mg by mouth in the morning, at noon, and at bedtime. (1000, 1500, AT BEDTIME)     aspirin EC 81 MG tablet Take 81 mg by mouth daily.  Swallow whole.     azelastine (ASTELIN) 0.1 % nasal spray Use 2 sprays in each nostril 1-2 times a day as needed for drainage down your throat/runny nose 30 mL 5   budesonide-formoterol (SYMBICORT) 80-4.5 MCG/ACT inhaler Inhale 2 puffs into the lungs 2 (two) times daily. 10.2 g 5   Cholecalciferol (VITAMIN D3) 50 MCG (2000 UT) capsule Take 2,000 Units by mouth daily.     clotrimazole-betamethasone (LOTRISONE) cream Apply 1 Application topically 2 (two) times daily. 30 g 0   diclofenac Sodium (VOLTAREN) 1 % GEL Apply 1 application topically as needed (back pain).     doxycycline (ADOXA) 100 MG tablet Take 1 tablet (100 mg total) by mouth 2 (two) times daily. (Patient not taking: Reported on 12/25/2022) 14 tablet 0   famotidine (PEPCID) 20 MG tablet One after supper 30 tablet 11   fexofenadine (ALLEGRA ALLERGY) 180 MG tablet Take 1 tablet (180 mg total) by mouth daily. (Patient not taking: Reported on 12/25/2022) 30 tablet 5   fluticasone (FLONASE) 50 MCG/ACT nasal spray Place 2 sprays into both nostrils daily. 16 g 5   guaiFENesin (MUCINEX PO) Take by mouth as needed.     hydrALAZINE (APRESOLINE) 25 MG tablet Take 25 mg by mouth 2 (two) times daily.     ipratropium (ATROVENT) 0.06 % nasal spray Place 2 sprays into both nostrils 4 (four) times daily as needed for rhinitis. 15 mL 5   levothyroxine (SYNTHROID) 50 MCG tablet Take 1 tablet (50  mcg total) by mouth daily before breakfast. 90 tablet 3   lidocaine (XYLOCAINE) 2 % solution Use as directed 5 mLs in the mouth or throat as needed for mouth pain. Gargle and spit 26m up to three times daily as needed for throat pain. 100 mL 0   mirabegron ER (MYRBETRIQ) 25 MG TB24 tablet Take 1 tablet (25 mg total) by mouth daily. 28 tablet 0   Multiple Vitamin (MULTIVITAMIN WITH MINERALS) TABS tablet Take 1 tablet by mouth daily in the afternoon.     mupirocin ointment (BACTROBAN) 2 % APPLY TO AFFECTED AREA DAILY 22 g 0   nystatin (MYCOSTATIN) 100000 UNIT/ML  suspension Take 5 mLs (500,000 Units total) by mouth 4 (four) times daily for 7 days. 473 mL 0   pantoprazole (PROTONIX) 40 MG tablet TAKE 1 TABLET BY MOUTH ONCE A DAY. 30 tablet 11   polyethylene glycol (MIRALAX / GLYCOLAX) 17 g packet Take 17 g by mouth daily.     raloxifene (EVISTA) 60 MG tablet Take 1 tablet (60 mg total) by mouth every morning. (Patient taking differently: Take 60 mg by mouth every other day. IN THE MORNING) 30 tablet 12   venlafaxine XR (EFFEXOR-XR) 75 MG 24 hr capsule venlafaxine ER 75 mg capsule,extended release 24 hr     No current facility-administered medications for this visit.    Allergies   Allergies as of 01/14/2023 - Review Complete 12/25/2022  Allergen Reaction Noted   Amoxicillin-pot clavulanate Nausea And Vomiting, Other (See Comments), Itching, and Nausea Only 10/04/2013   Ace inhibitors Cough    Guaifenesin Hives and Other (See Comments) 08/01/2022   Lisinopril Cough and Other (See Comments) 08/01/2022   Cetirizine  03/26/2022   Entex la Itching    Gabapentin Other (See Comments) 03/26/2022   Levofloxacin Hives and Itching 01/21/2017   Meloxicam Other (See Comments) 04/25/2020     Past Medical History   Past Medical History:  Diagnosis Date   Anemia    Anxiety    Arthritis    Asthma    Constipation    GERD (gastroesophageal reflux disease)    High blood pressure    History of COVID-19 12/28/2020   Hypothyroidism    PAD (peripheral artery disease) (HCC)    bilateral legs   Peripheral edema    Peripheral neuropathy    bilateral feet at night occ   Psoriasis    Seasonal allergies    Skin cancer    Left eye brow   Varicose veins    Vertigo 11/23/2013    Past Surgical History   Past Surgical History:  Procedure Laterality Date   APPENDECTOMY     patient unsure   BIOPSY  12/21/2020   Procedure: BIOPSY;  Surgeon: RDaneil Dolin MD;  [facility];  Service: Endoscopy;;   BIOPSY  05/28/2021   Procedure: BIOPSY;   Surgeon: CEloise Harman DO;  [facility];  Service: Endoscopy;;   CATARACT EXTRACTION W/PHACO Left 01/27/2017   Procedure: CATARACT EXTRACTION PHACO AND INTRAOCULAR LENS PLACEMENT [place]  Surgeon: KTonny Branch MD;  [facility];  Service: Ophthalmology;  Laterality: Left;  CDE: 29.90   CATARACT EXTRACTION W/PHACO Right 02/24/2017   Procedure: CATARACT EXTRACTION PHACO AND INTRAOCULAR LENS PLACEMENT ([facility]);  Surgeon: KTonny Branch MD;  [facility];  Service: Ophthalmology;  Laterality: Right;  CDE: 8.57   CHOLECYSTECTOMY     COLONOSCOPY N/A 10/01/2016   Procedure: COLONOSCOPY;  Surgeon: RDaneil Dolin MD; pancolonic diverticulosis, melanosis  coli, otherwise normal exam.  No recommendations to repeat colonoscopy.   COLONOSCOPY WITH PROPOFOL N/A 12/21/2020   Rourk: Diverticulosis   ESOPHAGOGASTRODUODENOSCOPY (EGD) WITH PROPOFOL N/A 12/21/2020   Rourk: Erosive reflux esophagitis, nonbleeding gastric ulcers with biopsy showing erosion/ulceration, negative for H. pylori.   ESOPHAGOGASTRODUODENOSCOPY (EGD) WITH PROPOFOL N/A 05/28/2021   Carver: gastritis (reactive gastropathy), no H.pylori   FLEXOR TENDON REPAIR Right 11/06/2017   Procedure: RIGHT WRIST FLEXOR TENDON REPAIRAND STT DEBRIEDMENT;  Surgeon: Leanora Cover, MD;  Location: [place];  Service: Orthopedics;  Laterality: Right;   MOUTH SURGERY     artery bleed   TOTAL HIP ARTHROPLASTY Right 01/31/2021   Procedure: TOTAL HIP ARTHROPLASTY ANTERIOR APPROACH;  Surgeon: Rod Can, MD;  [facility];  Service: Orthopedics;  Laterality: Right;   TUBAL LIGATION      Past Family History   Family History  Problem Relation Age of Onset   Hypertension Mother    Varicose Veins Father    Heart attack Father    Heart disease Father    Hypertension Brother    COPD Brother     Past Social History   Social History   Socioeconomic History   Marital status: Married    Spouse name: Not on file    Number of children: Not on file   Years of education: Not on file   Highest education level: Not on file  Occupational History   Not on file  Tobacco Use   Smoking status: Former    Packs/day: 0.25    Years: 5.00    Total pack years: 1.25    Types: Cigarettes    Quit date: 12/02/1997    Years since quitting: 25.1    Passive exposure: Past   Smokeless tobacco: Never  Vaping Use   Vaping Use: Never used  Substance and Sexual Activity   Alcohol use: Not Currently    Alcohol/week: 1.0 standard drink of alcohol    Types: 1 Glasses of wine per week   Drug use: No   Sexual activity: Not Currently    Birth control/protection: Surgical    Comment: tubal  Other Topics Concern   Not on file  Social History Narrative   Not on file   Social Determinants of Health   Financial Resource Strain: Low Risk  (04/10/2020)   Overall Financial Resource Strain (CARDIA)    Difficulty of Paying Living Expenses: Not hard at all  Food Insecurity: No Food Insecurity (05/31/2021)   Hunger Vital Sign    Worried About Running Out of Food in the Last Year: Never true    Ran Out of Food in the Last Year: Never true  Transportation Needs: No Transportation Needs (02/19/2022)   PRAPARE - Hydrologist (Medical): No    Lack of Transportation (Non-Medical): No  Physical Activity: Inactive (04/10/2020)   Exercise Vital Sign    Days of Exercise per Week: 0 days    Minutes of Exercise per Session: 0 min  Stress: Stress Concern Present (04/10/2020)   [place]    Feeling of Stress : To some extent  Social Connections: Socially Integrated (04/10/2020)   Social Connection and Isolation Panel [NHANES]    Frequency of Communication with Friends and Family: More than three times a week    Frequency of Social Gatherings with Friends and Family: More than three times a week    Attends Religious Services: More than 4 times  per  year    Active Member of Clubs or Organizations: Yes    Attends Archivist Meetings: More than 4 times per year    Marital Status: Married  Human resources officer Violence: Not At Risk (04/10/2020)   Humiliation, Afraid, Rape, and Kick questionnaire    Fear of Current or Ex-Partner: No    Emotionally Abused: No    Physically Abused: No    Sexually Abused: No    Review of Systems   General: Negative for anorexia, weight loss, fever, chills, fatigue, weakness. ENT: Negative for hoarseness, difficulty swallowing , nasal congestion. CV: Negative for chest pain, angina, palpitations, dyspnea on exertion, peripheral edema.  Respiratory: Negative for dyspnea at rest, dyspnea on exertion, cough, sputum, wheezing.  GI: See history of present illness. GU:  Negative for dysuria, hematuria, urinary incontinence, urinary frequency, nocturnal urination.  Endo: Negative for unusual weight change.     Physical Exam   There were no vitals taken for this visit.   General: Well-nourished, well-developed in no acute distress.  Eyes: No icterus. Mouth: Oropharyngeal mucosa moist and pink , no lesions erythema or exudate. Lungs: Clear to auscultation bilaterally.  Heart: Regular rate and rhythm, no murmurs rubs or gallops.  Abdomen: Bowel sounds are normal, nontender, nondistended, no hepatosplenomegaly or masses,  no abdominal bruits or hernia , no rebound or guarding.  Rectal: ***  Extremities: No lower extremity edema. No clubbing or deformities. Neuro: Alert and oriented x 4   Skin: Warm and dry, no jaundice.   Psych: Alert and cooperative, normal mood and affect.  Labs   *** Imaging Studies   US ARTERIAL ABI (SCREENING LOWER EXTREMITY)  Result Date: 01/09/2023 CLINICAL DATA:  Bilateral lower extremity edema and rest pain EXAM: NONINVASIVE PHYSIOLOGIC VASCULAR STUDY OF BILATERAL LOWER EXTREMITIES TECHNIQUE: Evaluation of both lower extremities were performed at rest, including  calculation of ankle-brachial indices with single level Doppler, pressure and pulse volume recording. COMPARISON:  None Available. FINDINGS: Right ABI:  1.0 Left ABI:  0.98 Right Lower Extremity:  Normal arterial waveforms at the ankle. Left Lower Extremity:  Normal arterial waveforms at the ankle. 1.0-1.4 Normal IMPRESSION: Normal bilateral resting ankle-brachial indices. Signed, Criselda Peaches, MD, [facility] Radiology Electronically Signed   By: Jacqulynn Cadet M.D.   On: 01/09/2023 13:04    Assessment       PLAN   ***   Laureen Ochs. Bobby Rumpf, [place], [facility]

## 2023-01-14 ENCOUNTER — Ambulatory Visit (INDEPENDENT_AMBULATORY_CARE_PROVIDER_SITE_OTHER): Payer: Medicare HMO | Admitting: Gastroenterology

## 2023-01-14 ENCOUNTER — Encounter: Payer: Self-pay | Admitting: Gastroenterology

## 2023-01-14 VITALS — BP 158/109 | HR 79 | Temp 97.5°F | Ht 64.0 in | Wt 148.6 lb

## 2023-01-14 DIAGNOSIS — K2970 Gastritis, unspecified, without bleeding: Secondary | ICD-10-CM

## 2023-01-14 DIAGNOSIS — K5904 Chronic idiopathic constipation: Secondary | ICD-10-CM | POA: Diagnosis not present

## 2023-01-14 DIAGNOSIS — K2990 Gastroduodenitis, unspecified, without bleeding: Secondary | ICD-10-CM

## 2023-01-14 NOTE — Patient Instructions (Signed)
Continue miralax 17 grams daily as needed.  Continue pantoprazole 76m daily before breakfast. Call if you have persistent loose stools.  Return to the office in one year or sooner if needed.  Please contact your PCP if you have persistent elevation of your blood pressure.

## 2023-01-28 DIAGNOSIS — R600 Localized edema: Secondary | ICD-10-CM | POA: Diagnosis not present

## 2023-01-28 DIAGNOSIS — I10 Essential (primary) hypertension: Secondary | ICD-10-CM | POA: Diagnosis not present

## 2023-02-13 ENCOUNTER — Telehealth: Payer: Self-pay

## 2023-02-13 NOTE — Telephone Encounter (Signed)
Forms have been faxed back to [facility] regarding the patient's CT Scan that is to be done at [facility]. CT Scan was denied via phone on 02/12/23.    Papers have been faxed to [phone]  Track # [id number]

## 2023-02-14 ENCOUNTER — Ambulatory Visit (HOSPITAL_COMMUNITY): Payer: Medicare HMO

## 2023-02-18 NOTE — Telephone Encounter (Signed)
[name] CT scan has been approved. Patient has an appointment on 03/17/23 at 9:30 and plans to arrive at 9:15   Auth. # [id number]

## 2023-02-20 ENCOUNTER — Encounter: Payer: Self-pay | Admitting: *Deleted

## 2023-02-20 ENCOUNTER — Other Ambulatory Visit
Admission: RE | Admit: 2023-02-20 | Discharge: 2023-02-20 | Disposition: A | Discharge status: Home / Self Care | Payer: Medicare HMO | Source: Ambulatory Visit | Attending: Internal Medicine | Admitting: Internal Medicine

## 2023-02-20 ENCOUNTER — Encounter: Payer: Self-pay | Admitting: Internal Medicine

## 2023-02-20 ENCOUNTER — Ambulatory Visit: Payer: Medicare HMO | Admitting: Internal Medicine

## 2023-02-20 VITALS — BP 130/76 | HR 76 | Ht 64.0 in | Wt 150.4 lb

## 2023-02-20 DIAGNOSIS — I4892 Unspecified atrial flutter: Secondary | ICD-10-CM | POA: Diagnosis not present

## 2023-02-20 LAB — COMPREHENSIVE METABOLIC PANEL
ALT: 18 U/L (ref 0–44)
AST: 24 U/L (ref 15–41)
Albumin: 4.1 g/dL (ref 3.5–5.0)
Alkaline Phosphatase: 74 U/L (ref 38–126)
Anion gap: 9 (ref 5–15)
BUN: 20 mg/dL (ref 8–23)
CO2: 28 mmol/L (ref 22–32)
Calcium: 9.1 mg/dL (ref 8.9–10.3)
Chloride: 100 mmol/L (ref 98–111)
Creatinine, Ser: 0.9 mg/dL (ref 0.44–1.00)
GFR, Estimated: >60 mL/min (ref >60)
Glucose, Bld: 96 mg/dL (ref 70–99)
Potassium: 3.4 mmol/L — ABNORMAL LOW (ref 3.5–5.1)
Sodium: 137 mmol/L (ref 135–145)
Total Bilirubin: 0.8 mg/dL (ref 0.3–1.2)
Total Protein: 7.2 g/dL (ref 6.5–8.1)

## 2023-02-20 MED ORDER — APIXABAN 5 MG PO TABS: 5.0000 mg | ORAL_TABLET | Freq: Two times a day (BID) | ORAL | 11 refills | Status: DC

## 2023-02-20 NOTE — Progress Notes (Signed)
Cardiology Office Note  Date: 02/20/2023   ID: [name], [name] [birth year], MRN [id number]  PCP:  [name], MD  Cardiologist:  None Electrophysiologist:  None   Reason for Office Visit: Evaluation of abnormal electrocardiogram at the request of Burney Gauze, NP   History of Present Illness: [name] is a 82 y.o. female known to have HTN was referred to cardiology clinic for abnormal electrocardiogram.  Patient takes care of her husband that has advanced Alzheimer's dementia.  He was diagnosed with Alzheimer's dementia in 2016 and has been worsening. As of now, her daughters and caretakers help take care of her husband. Patient says she was stressed out about taking care of her husband. She noticed feeling tired and short of breath since 12/2022 and does not think this could be from stress as her husband had Alzheimer's for many years. She had recent PCP visit and EKG showed flutter waves, per PCP documentation. She is currently on aspirin and not on any DOAC's.  Past Medical History:  Diagnosis Date   Anemia    Anxiety    Arthritis    Asthma    Constipation    GERD (gastroesophageal reflux disease)    High blood pressure    History of COVID-19 12/28/2020   Hypothyroidism    PAD (peripheral artery disease) (HCC)    bilateral legs   Peripheral edema    Peripheral neuropathy    bilateral feet at night occ   Psoriasis    Seasonal allergies    Skin cancer    Left eye brow   Varicose veins    Vertigo 11/23/2013    Past Surgical History:  Procedure Laterality Date   APPENDECTOMY     patient unsure   BIOPSY  12/21/2020   Procedure: BIOPSY;  Surgeon: Daneil Dolin, MD;  [facility];  Service: Endoscopy;;   BIOPSY  05/28/2021   Procedure: BIOPSY;  Surgeon: Eloise Harman, DO;  [facility];  Service: Endoscopy;;   CATARACT EXTRACTION W/PHACO Left 01/27/2017   Procedure: CATARACT EXTRACTION PHACO AND INTRAOCULAR LENS PLACEMENT ([place]);   Surgeon: Tonny Branch, MD;  [facility];  Service: Ophthalmology;  Laterality: Left;  CDE: 29.90   CATARACT EXTRACTION W/PHACO Right 02/24/2017   Procedure: CATARACT EXTRACTION PHACO AND INTRAOCULAR LENS PLACEMENT ([facility]);  Surgeon: Tonny Branch, MD;  [facility];  Service: Ophthalmology;  Laterality: Right;  CDE: 8.57   CHOLECYSTECTOMY     COLONOSCOPY N/A 10/01/2016   Procedure: COLONOSCOPY;  Surgeon: Daneil Dolin, MD; pancolonic diverticulosis, melanosis coli, otherwise normal exam.  No recommendations to repeat colonoscopy.   COLONOSCOPY WITH PROPOFOL N/A 12/21/2020   Rourk: Diverticulosis   ESOPHAGOGASTRODUODENOSCOPY (EGD) WITH PROPOFOL N/A 12/21/2020   Rourk: Erosive reflux esophagitis, nonbleeding gastric ulcers with biopsy showing erosion/ulceration, negative for H. pylori.   ESOPHAGOGASTRODUODENOSCOPY (EGD) WITH PROPOFOL N/A 05/28/2021   Carver: gastritis (reactive gastropathy), no H.pylori   FLEXOR TENDON REPAIR Right 11/06/2017   Procedure: RIGHT WRIST FLEXOR TENDON REPAIRAND STT DEBRIEDMENT;  Surgeon: Leanora Cover, MD;  Location: [place];  Service: Orthopedics;  Laterality: Right;   MOUTH SURGERY     artery bleed   TOTAL HIP ARTHROPLASTY Right 01/31/2021   Procedure: TOTAL HIP ARTHROPLASTY ANTERIOR APPROACH;  Surgeon: Rod Can, MD;  [facility];  Service: Orthopedics;  Laterality: Right;   TUBAL LIGATION      Current Outpatient Medications  Medication Sig Dispense Refill   acetaminophen (TYLENOL) 500 MG  tablet Take 500-1,000 mg by mouth as needed for mild pain or moderate pain.     albuterol (VENTOLIN HFA) 108 (90 Base) MCG/ACT inhaler Inhale 2 puffs into the lungs every 6 (six) hours as needed for wheezing or shortness of breath. 8 g 1   ALPRAZolam (XANAX) 0.5 MG tablet Take 0.5 mg by mouth in the morning, at noon, and at bedtime. (1000, 1500, AT BEDTIME)     amLODipine (NORVASC) 2.5 MG tablet Take 2.5 mg by mouth 2 (two) times daily.      aspirin EC 81 MG tablet Take 81 mg by mouth daily. Swallow whole.     azelastine (ASTELIN) 0.1 % nasal spray Use 2 sprays in each nostril 1-2 times a day as needed for drainage down your throat/runny nose 30 mL 5   budesonide-formoterol (SYMBICORT) 80-4.5 MCG/ACT inhaler Inhale 2 puffs into the lungs 2 (two) times daily. 10.2 g 5   Cholecalciferol (VITAMIN D3) 50 MCG (2000 UT) capsule Take 2,000 Units by mouth daily.     diclofenac Sodium (VOLTAREN) 1 % GEL Apply 1 application topically as needed (back pain).     ELDERBERRY PO Take by mouth.     famotidine (PEPCID) 20 MG tablet One after supper 30 tablet 11   ipratropium (ATROVENT) 0.06 % nasal spray Place 2 sprays into both nostrils 4 (four) times daily as needed for rhinitis. 15 mL 5   levothyroxine (SYNTHROID) 50 MCG tablet Take 1 tablet (50 mcg total) by mouth daily before breakfast. 90 tablet 3   losartan (COZAAR) 50 MG tablet Take 50 mg by mouth 2 (two) times daily.     Multiple Vitamin (MULTIVITAMIN WITH MINERALS) TABS tablet Take 1 tablet by mouth daily in the afternoon.     pantoprazole (PROTONIX) 40 MG tablet TAKE 1 TABLET BY MOUTH ONCE A DAY. 30 tablet 11   polyethylene glycol (MIRALAX / GLYCOLAX) 17 g packet Take 17 g by mouth daily.     venlafaxine XR (EFFEXOR-XR) 75 MG 24 hr capsule venlafaxine ER 75 mg capsule,extended release 24 hr     ZINC CITRATE PO Take by mouth.     guaiFENesin (MUCINEX PO) Take by mouth as needed. (Patient not taking: Reported on 02/20/2023)     hydrALAZINE (APRESOLINE) 25 MG tablet Take 25 mg by mouth 2 (two) times daily. (Patient not taking: Reported on 02/20/2023)     lidocaine (XYLOCAINE) 2 % solution Use as directed 5 mLs in the mouth or throat as needed for mouth pain. Gargle and spit 34mL up to three times daily as needed for throat pain. (Patient not taking: Reported on 02/20/2023) 100 mL 0   raloxifene (EVISTA) 60 MG tablet Take 1 tablet (60 mg total) by mouth every morning. (Patient taking differently:  Take 60 mg by mouth every other day. IN THE MORNING) 30 tablet 12   No current facility-administered medications for this visit.   Allergies:  Amoxicillin-pot clavulanate, Ace inhibitors, Guaifenesin, Lisinopril, Cetirizine, Clarithromycin, Entex la, Gabapentin, Levofloxacin, and Meloxicam   Social History: The patient  reports that she quit smoking about 25 years ago. Her smoking use included cigarettes. She has a 1.25 pack-year smoking history. She has been exposed to tobacco smoke. She has never used smokeless tobacco. She reports that she does not currently use alcohol after a past usage of about 1.0 standard drink of alcohol per week. She reports that she does not use drugs.   Family History: The patient's family history includes COPD in her brother; Heart  attack in her father; Heart disease in her father; Hypertension in her brother and mother; Varicose Veins in her father.   ROS:  Please see the history of present illness. Otherwise, complete review of systems is positive for none.  All other systems are reviewed and negative.   Physical Exam: VS:  Ht 5\' 4"  (1.626 m)   Wt 150 lb 6.4 oz (68.2 kg)   BMI 25.82 kg/m , BMI Body mass index is 25.82 kg/m.  Wt Readings from Last 3 Encounters:  02/20/23 150 lb 6.4 oz (68.2 kg)  01/14/23 148 lb 9.6 oz (67.4 kg)  11/27/22 154 lb 12.8 oz (70.2 kg)    General: Patient appears comfortable at rest. HEENT: Conjunctiva and lids normal, oropharynx clear with moist mucosa. Neck: Supple, no elevated JVP or carotid bruits, no thyromegaly. Lungs: Clear to auscultation, nonlabored breathing at rest. Cardiac: Regular rate and rhythm, no S3 or significant systolic murmur, no pericardial rub. Abdomen: Soft, nontender, no hepatomegaly, bowel sounds present, no guarding or rebound. Extremities: No pitting edema, distal pulses 2+. Skin: Warm and dry. Musculoskeletal: No kyphosis. Neuropsychiatric: Alert and oriented x3, affect grossly appropriate.  ECG:  Atrial flutter on 02/20/2023, rate controlled  Recent Labwork: 08/01/2022: BNP 93.9; Platelets 215 09/16/2022: TSH 3.140 12/26/2022: Hemoglobin 11.0     Component Value Date/Time   TRIG 92 09/11/2020 1547    Other Studies Reviewed Today: I personally reviewed the heart care referral records from the PCP  Assessment and Plan: Patient is a 82 year old F known to have HTN was referred to cardiology clinic for evaluation of abnormal electrocardiogram.  # Persistent atrial flutter, CV score 3 -EKG today showed atrial flutter, HR 80 bpm. Patient has been having fatigue and SOB x 2 months with new onset of rate controlled atrial flutter.  She will benefit from rhythm control strategy with DCCV. Discontinue aspirin and start Eliquis 5 mg twice daily. I spoke with the daughter, Jason Coop who stated that patient had imbalance issues because of low blood pressure and did not have a real fall. Obtain CMP and 2D echocardiogram. Patient's daughter would like to the procedure to be scheduled after May 12/2022.  Patient and her daughter voiced understanding that she should not miss any doses of Eliquis in the 4 weeks prior to the DCCV procedure. After successful DCCV, she will need to be started on amiodarone 200 mg twice daily x 3 weeks followed by amiodarone 200 mg once daily.  Not on any rate controlling agents as of now. -The risks, benefits and alternatives for the DCCV procedure were discussed with the patient and her daughter Jason Coop over the phone), the patient/daughter comprehended these risks and is willing to proceed.  Risks include, but are not limited to, chest pain, aspiration, cardiac arrest, possibility of PPM implantation. -Outpatient OSA evaluation  # HTN, controlled -Continue amlodipine 2.5 mg twice daily -HTN management per PCP  I have spent a total of 45 minutes with patient reviewing chart, EKGs, labs and examining patient as well as establishing an assessment and plan that was  discussed with the patient.  > 50% of time was spent in direct patient care.     Medication Adjustments/Labs and Tests Ordered: Current medicines are reviewed at length with the patient today.  Concerns regarding medicines are outlined above.   Tests Ordered: Orders Placed This Encounter  Procedures   Comprehensive Metabolic Panel (CMET)   EKG 12-Lead   ECHOCARDIOGRAM COMPLETE     Medication Changes: No orders of  the defined types were placed in this encounter.   Disposition:  Follow up  1 month after DCCV  Signed, Rathana Viveros Fidel Levy, MD, 02/20/2023 11:41 AM    [facility]. [address], [place], [place] [address]

## 2023-02-20 NOTE — Patient Instructions (Signed)
Medication Instructions:   Stop Taking Aspirin   Start Taking Eliquis 5 mg Two Times Daily   *If you need a refill on your cardiac medications before your next appointment, please call your pharmacy*   Lab Work: Your physician recommends that you return for lab work in: Today ( CMET)   If you have labs (blood work) drawn today and your tests are completely normal, you will receive your results only by: [place] (if you have MyChart) OR A paper copy in the mail If you have any lab test that is abnormal or we need to change your treatment, we will call you to review the results.   Testing/Procedures: Your physician has requested that you have an echocardiogram. Echocardiography is a painless test that uses sound waves to create images of your heart. It provides your doctor with information about the size and shape of your heart and how well your heart's chambers and valves are working. This procedure takes approximately one hour. There are no restrictions for this procedure. Please do NOT wear cologne, perfume, aftershave, or lotions (deodorant is allowed). Please arrive 15 minutes prior to your appointment time.  Your physician has recommended that you have a Cardioversion (DCCV). Electrical Cardioversion uses a jolt of electricity to your heart either through paddles or wired patches attached to your chest. This is a controlled, usually prescheduled, procedure. Defibrillation is done under light anesthesia in the hospital, and you usually go home the day of the procedure. This is done to get your heart back into a normal rhythm. You are not awake for the procedure. Please see the instruction sheet given to you today.    Follow-Up: At [facility], you and your health needs are our priority.  As part of our continuing mission to provide you with exceptional heart care, we have created designated Provider Care Teams.  These Care Teams include your primary Cardiologist  (physician) and Advanced Practice Providers (APPs -  Physician Assistants and Nurse Practitioners) who all work together to provide you with the care you need, when you need it.  We recommend signing up for the patient portal called "MyChart".  Sign up information is provided on this After Visit Summary.  MyChart is used to connect with patients for Virtual Visits (Telemedicine).  Patients are able to view lab/test results, encounter notes, upcoming appointments, etc.  Non-urgent messages can be sent to your provider as well.   To learn more about what you can do with MyChart, go to [email or web address].    Your next appointment:    2 Months   Provider:   Claudina Lick, MD    Other Instructions Thank you for choosing [facility]!

## 2023-03-03 ENCOUNTER — Other Ambulatory Visit: Payer: Self-pay | Admitting: Podiatry

## 2023-03-03 ENCOUNTER — Telehealth: Payer: Self-pay

## 2023-03-03 ENCOUNTER — Telehealth: Payer: Self-pay | Admitting: Podiatry

## 2023-03-03 NOTE — Telephone Encounter (Signed)
Patient notified and verbalized understanding. Patient had no questions or concerns at this time. PCP copied 

## 2023-03-03 NOTE — Telephone Encounter (Signed)
Patient called and requested a refill on Clotrimazole. She would like it sent to [name]   Please advise

## 2023-03-03 NOTE — Telephone Encounter (Signed)
-----   Message from Chalmers Guest, MD sent at 02/28/2023  3:42 PM EDT ----- Normal serum creatinine. Continue Eliquis 5 mg BID. K is 3.4. Eat K rich dietary sources like banana, drink coconut water.

## 2023-03-03 NOTE — Progress Notes (Signed)
Erro

## 2023-03-04 ENCOUNTER — Other Ambulatory Visit: Payer: Self-pay | Admitting: Podiatry

## 2023-03-04 MED ORDER — CLOTRIMAZOLE-BETAMETHASONE 1-0.05 % EX CREA: 1.0000 | TOPICAL_CREAM | Freq: Every day | CUTANEOUS | 0 refills | Status: DC

## 2023-03-06 ENCOUNTER — Telehealth: Payer: Self-pay | Admitting: Internal Medicine

## 2023-03-06 NOTE — Telephone Encounter (Signed)
Patient called to check if she will be scheduled for an electrocardiogram procedure.

## 2023-03-06 NOTE — Telephone Encounter (Signed)
Patient notified that Echo is 5/1.

## 2023-03-10 ENCOUNTER — Telehealth: Payer: Self-pay | Admitting: Podiatry

## 2023-03-10 NOTE — Telephone Encounter (Signed)
Pt wants to speak with nurse about her R great toe pain. Wants to know how to treat the pain from home.   [facility] - [place], [place] - [address]   Please advise

## 2023-03-11 ENCOUNTER — Telehealth: Payer: Self-pay | Admitting: Podiatry

## 2023-03-11 NOTE — Telephone Encounter (Signed)
error 

## 2023-03-11 NOTE — Telephone Encounter (Signed)
Pt is calling back to get answers as to what to do for her corns and callus on foot.  Please advise

## 2023-03-11 NOTE — Telephone Encounter (Signed)
Patient is scheduled with Dr. Charlsie Merles on 03/13/23

## 2023-03-12 ENCOUNTER — Ambulatory Visit: Payer: Medicare HMO | Admitting: Podiatry

## 2023-03-12 DIAGNOSIS — L84 Corns and callosities: Secondary | ICD-10-CM

## 2023-03-12 DIAGNOSIS — M2031 Hallux varus (acquired), right foot: Secondary | ICD-10-CM | POA: Diagnosis not present

## 2023-03-12 DIAGNOSIS — Z7901 Long term (current) use of anticoagulants: Secondary | ICD-10-CM | POA: Diagnosis not present

## 2023-03-12 NOTE — Telephone Encounter (Signed)
[facility] called in on 03/11/23 - DOB verified - stated authorization CT Maxillofacial wo & w/ dye has expired - good for 02/12/23 - 03/14/23 - CT scheduled for : 03/17/23 @ 9:30 am.  Jovita Gamma [employer] approval documentation to [facility] to contact [employer] for extension on Approval on 03/11/23.

## 2023-03-13 ENCOUNTER — Ambulatory Visit: Payer: Medicare HMO | Admitting: Podiatry

## 2023-03-13 ENCOUNTER — Telehealth: Payer: Self-pay

## 2023-03-13 NOTE — Telephone Encounter (Signed)
[name] CT scan has been approved. Patient has an appointment on 03/17/23 at 9:30 and plans to arrive at 9:15  Prior Auth had been extended to May 11th 2024. Patient is cleared for her 03/17/23 appointment.      Auth. # [id number]

## 2023-03-13 NOTE — Progress Notes (Signed)
Subjective: No chief complaint on file.   82 year old female presents the office today for painful recurrent callus of the right foot on the big toe.  She said the area is tender to walk.  Denies any swelling redness or any drainage.  She has recently been on blood thinners for A-fib.   Objective: AAO x3, NAD DP/PT pulses palpable bilaterally, CRT less than 3 seconds There is hyperkeratotic tissue present plantar aspect of right hallux with dried blood.  Upon debridement there is no underlying ulceration drainage or any signs of infection but it is preulcerative.  No surrounding erythema, ascending cellulitis.  No cultures were crepitation but there is no malodor. Hallux malleolus present  No pain with calf compression, swelling, warmth, erythema  Assessment: Preulcerative callus right foot  Plan: -All treatment options discussed with the patient including all alternatives, risks, complications.  -Show reviewed the hyperkeratotic tissue and complications.  Area is preulcerative.  Recommend moisturizer, offloading.  I do think she will benefit from the inserts.  We will measure for this next appointment when she comes in. -Offloading given the hallux malleus. -Monitor for any clinical signs or symptoms of infection and directed to call the office immediately should any occur or go to the ER.  Follow-up as scheduled or sooner if needed.  Vivi Barrack DPM

## 2023-03-14 ENCOUNTER — Telehealth: Payer: Self-pay | Admitting: Allergy

## 2023-03-14 NOTE — Telephone Encounter (Signed)
Patient called asking if it's okay for her to get CT done next week. She was diagnosed with afib and going to have cardioversion done in 3 weeks.  Reassured her that it's okay to get CT sinus done as it's a quick test.  No contrast was ordered and it should not affect her heart.

## 2023-03-17 ENCOUNTER — Ambulatory Visit (HOSPITAL_COMMUNITY)
Admission: RE | Admit: 2023-03-17 | Discharge: 2023-03-17 | Disposition: A | Discharge status: Home / Self Care | Payer: Medicare HMO | Source: Ambulatory Visit | Attending: Allergy & Immunology | Admitting: Allergy & Immunology

## 2023-03-17 DIAGNOSIS — F418 Other specified anxiety disorders: Secondary | ICD-10-CM | POA: Diagnosis not present

## 2023-03-17 DIAGNOSIS — I10 Essential (primary) hypertension: Secondary | ICD-10-CM | POA: Diagnosis not present

## 2023-03-17 DIAGNOSIS — E782 Mixed hyperlipidemia: Secondary | ICD-10-CM | POA: Diagnosis not present

## 2023-03-17 DIAGNOSIS — E876 Hypokalemia: Secondary | ICD-10-CM | POA: Diagnosis not present

## 2023-03-17 DIAGNOSIS — E119 Type 2 diabetes mellitus without complications: Secondary | ICD-10-CM | POA: Diagnosis not present

## 2023-03-17 DIAGNOSIS — R600 Localized edema: Secondary | ICD-10-CM | POA: Diagnosis not present

## 2023-03-17 DIAGNOSIS — E039 Hypothyroidism, unspecified: Secondary | ICD-10-CM | POA: Diagnosis not present

## 2023-03-17 DIAGNOSIS — J329 Chronic sinusitis, unspecified: Secondary | ICD-10-CM | POA: Diagnosis not present

## 2023-03-17 DIAGNOSIS — I4892 Unspecified atrial flutter: Secondary | ICD-10-CM | POA: Diagnosis not present

## 2023-03-17 DIAGNOSIS — Z0000 Encounter for general adult medical examination without abnormal findings: Secondary | ICD-10-CM | POA: Diagnosis not present

## 2023-03-17 DIAGNOSIS — J324 Chronic pansinusitis: Secondary | ICD-10-CM

## 2023-03-17 DIAGNOSIS — D6869 Other thrombophilia: Secondary | ICD-10-CM | POA: Diagnosis not present

## 2023-03-17 DIAGNOSIS — K219 Gastro-esophageal reflux disease without esophagitis: Secondary | ICD-10-CM | POA: Diagnosis not present

## 2023-03-18 DIAGNOSIS — E039 Hypothyroidism, unspecified: Secondary | ICD-10-CM | POA: Diagnosis not present

## 2023-03-18 NOTE — Telephone Encounter (Signed)
Thanks - she is a nervous nelly. I appreciate it!   Malachi Bonds, MD [facility]

## 2023-03-19 LAB — T4, FREE: Free T4: 1.09 ng/dL (ref 0.82–1.77)

## 2023-03-19 LAB — TSH: TSH: 2.3 u[IU]/mL (ref 0.450–4.500)

## 2023-03-26 ENCOUNTER — Encounter: Payer: Self-pay | Admitting: Nurse Practitioner

## 2023-03-26 ENCOUNTER — Ambulatory Visit: Payer: Medicare HMO | Admitting: Nurse Practitioner

## 2023-03-26 VITALS — BP 122/84 | HR 64 | Ht 64.0 in | Wt 152.2 lb

## 2023-03-26 DIAGNOSIS — E039 Hypothyroidism, unspecified: Secondary | ICD-10-CM | POA: Diagnosis not present

## 2023-03-26 MED ORDER — LEVOTHYROXINE SODIUM 50 MCG PO TABS: 50.0000 ug | ORAL_TABLET | Freq: Every day | ORAL | 3 refills | Status: DC

## 2023-03-26 NOTE — Patient Instructions (Signed)

## 2023-03-26 NOTE — Progress Notes (Signed)
Endocrinology Follow Up Visit                                        03/26/2023, 11:13 AM     SUBJECTIVE:  [name] is a 82 y.o.-year-old female patient being seen in follow up after being seen in consultation for hypothyroidism referred by Benita Stabile, MD.   Past Medical History:  Diagnosis Date   Anemia    Anxiety    Arthritis    Asthma    Constipation    GERD (gastroesophageal reflux disease)    High blood pressure    History of COVID-19 12/28/2020   Hypothyroidism    PAD (peripheral artery disease)    bilateral legs   Peripheral edema    Peripheral neuropathy    bilateral feet at night occ   Psoriasis    Seasonal allergies    Skin cancer    Left eye brow   Varicose veins    Vertigo 11/23/2013    Past Surgical History:  Procedure Laterality Date   APPENDECTOMY     patient unsure   BIOPSY  12/21/2020   Procedure: BIOPSY;  Surgeon: Corbin Ade, MD;  Location: [facility];  Service: Endoscopy;;   BIOPSY  05/28/2021   Procedure: BIOPSY;  Surgeon: Lanelle Bal, DO;  Location: [facility];  Service: Endoscopy;;   CATARACT EXTRACTION W/PHACO Left 01/27/2017   Procedure: CATARACT EXTRACTION PHACO AND INTRAOCULAR LENS PLACEMENT (IOC);  Surgeon: Gemma Payor, MD;  Location: [facility];  Service: Ophthalmology;  Laterality: Left;  CDE: 29.90   CATARACT EXTRACTION W/PHACO Right 02/24/2017   Procedure: CATARACT EXTRACTION PHACO AND INTRAOCULAR LENS PLACEMENT (IOC);  Surgeon: Gemma Payor, MD;  Location: [facility];  Service: Ophthalmology;  Laterality: Right;  CDE: 8.57   CHOLECYSTECTOMY     COLONOSCOPY N/A 10/01/2016   Procedure: COLONOSCOPY;  Surgeon: Corbin Ade, MD; pancolonic diverticulosis, melanosis coli, otherwise normal exam.  No recommendations to repeat colonoscopy.   COLONOSCOPY WITH PROPOFOL N/A 12/21/2020   Rourk: Diverticulosis   ESOPHAGOGASTRODUODENOSCOPY (EGD) WITH PROPOFOL N/A  12/21/2020   Rourk: Erosive reflux esophagitis, nonbleeding gastric ulcers with biopsy showing erosion/ulceration, negative for H. pylori.   ESOPHAGOGASTRODUODENOSCOPY (EGD) WITH PROPOFOL N/A 05/28/2021   Carver: gastritis (reactive gastropathy), no H.pylori   FLEXOR TENDON REPAIR Right 11/06/2017   Procedure: RIGHT WRIST FLEXOR TENDON REPAIRAND STT DEBRIEDMENT;  Surgeon: Betha Loa, MD;  Location: [facility];  Service: Orthopedics;  Laterality: Right;   MOUTH SURGERY     artery bleed   TOTAL HIP ARTHROPLASTY Right 01/31/2021   Procedure: TOTAL HIP ARTHROPLASTY ANTERIOR APPROACH;  Surgeon: Samson Frederic, MD;  Location: [facility];  Service: Orthopedics;  Laterality: Right;   TUBAL LIGATION      Social History   Socioeconomic History   Marital status: Married    Spouse name: Not on file   Number of children: Not on file   Years of education: Not on file   Highest education level: Not on file  Occupational History  Not on file  Tobacco Use   Smoking status: Former    Packs/day: 0.25    Years: 5.00    Additional pack years: 0.00    Total pack years: 1.25    Types: Cigarettes    Quit date: 12/02/1997    Years since quitting: 25.3    Passive exposure: Past   Smokeless tobacco: Never  Vaping Use   Vaping Use: Never used  Substance and Sexual Activity   Alcohol use: Not Currently    Alcohol/week: 1.0 standard drink of alcohol    Types: 1 Glasses of wine per week   Drug use: No   Sexual activity: Not Currently    Birth control/protection: Surgical    Comment: tubal  Other Topics Concern   Not on file  Social History Narrative   Not on file   Social Determinants of Health   Financial Resource Strain: Low Risk  (04/10/2020)   Overall Financial Resource Strain (CARDIA)    Difficulty of Paying Living Expenses: Not hard at all  Food Insecurity: No Food Insecurity (05/31/2021)   Hunger Vital Sign    Worried About Running Out of Food in the Last Year: Never true     Ran Out of Food in the Last Year: Never true  Transportation Needs: No Transportation Needs (02/19/2022)   PRAPARE - Administrator, Civil Service (Medical): No    Lack of Transportation (Non-Medical): No  Physical Activity: Inactive (04/10/2020)   Exercise Vital Sign    Days of Exercise per Week: 0 days    Minutes of Exercise per Session: 0 min  Stress: Stress Concern Present (04/10/2020)   [employer] of Occupational Health - Occupational Stress Questionnaire    Feeling of Stress : To some extent  Social Connections: Socially Integrated (04/10/2020)   Social Connection and Isolation Panel [NHANES]    Frequency of Communication with Friends and Family: More than three times a week    Frequency of Social Gatherings with Friends and Family: More than three times a week    Attends Religious Services: More than 4 times per year    Active Member of [employer] or Organizations: Yes    Attends Engineer, structural: More than 4 times per year    Marital Status: Married    Family History  Problem Relation Age of Onset   Hypertension Mother    Varicose Veins Father    Heart attack Father    Heart disease Father    Hypertension Brother    COPD Brother     Outpatient Encounter Medications as of 03/26/2023  Medication Sig   acetaminophen (TYLENOL) 500 MG tablet Take 500-1,000 mg by mouth as needed for mild pain or moderate pain.   albuterol (VENTOLIN HFA) 108 (90 Base) MCG/ACT inhaler Inhale 2 puffs into the lungs every 6 (six) hours as needed for wheezing or shortness of breath.   ALPRAZolam (XANAX) 0.5 MG tablet Take 0.5 mg by mouth in the morning, at noon, and at bedtime. (1000, 1500, AT BEDTIME)   amLODipine (NORVASC) 2.5 MG tablet Take 2.5 mg by mouth 2 (two) times daily.   apixaban (ELIQUIS) 5 MG TABS tablet Take 1 tablet (5 mg total) by mouth 2 (two) times daily.   azelastine (ASTELIN) 0.1 % nasal spray Use 2 sprays in each nostril 1-2 times a day as needed for  drainage down your throat/runny nose   budesonide-formoterol (SYMBICORT) 80-4.5 MCG/ACT inhaler Inhale 2 puffs into the lungs 2 (two) times daily.  Cholecalciferol (VITAMIN D3) 50 MCG (2000 UT) capsule Take 2,000 Units by mouth daily.   clotrimazole-betamethasone (LOTRISONE) cream Apply 1 Application topically daily.   diclofenac Sodium (VOLTAREN) 1 % GEL Apply 1 application topically as needed (back pain).   ELDERBERRY PO Take by mouth.   famotidine (PEPCID) 20 MG tablet One after supper   ipratropium (ATROVENT) 0.06 % nasal spray Place 2 sprays into both nostrils 4 (four) times daily as needed for rhinitis.   losartan (COZAAR) 50 MG tablet Take 50 mg by mouth 2 (two) times daily.   Multiple Vitamin (MULTIVITAMIN WITH MINERALS) TABS tablet Take 1 tablet by mouth daily in the afternoon.   mupirocin ointment (BACTROBAN) 2 % APPLY TO AFFECTED AREA DAILY   pantoprazole (PROTONIX) 40 MG tablet TAKE 1 TABLET BY MOUTH ONCE A DAY.   polyethylene glycol (MIRALAX / GLYCOLAX) 17 g packet Take 17 g by mouth daily.   raloxifene (EVISTA) 60 MG tablet Take 1 tablet (60 mg total) by mouth every morning. (Patient taking differently: Take 60 mg by mouth every other day. IN THE MORNING)   venlafaxine XR (EFFEXOR-XR) 75 MG 24 hr capsule venlafaxine ER 75 mg capsule,extended release 24 hr   ZINC CITRATE PO Take by mouth.   [DISCONTINUED] levothyroxine (SYNTHROID) 50 MCG tablet Take 1 tablet (50 mcg total) by mouth daily before breakfast.   guaiFENesin (MUCINEX PO) Take by mouth as needed. (Patient not taking: Reported on 02/20/2023)   hydrALAZINE (APRESOLINE) 25 MG tablet Take 25 mg by mouth 2 (two) times daily. (Patient not taking: Reported on 02/20/2023)   levothyroxine (SYNTHROID) 50 MCG tablet Take 1 tablet (50 mcg total) by mouth daily before breakfast.   lidocaine (XYLOCAINE) 2 % solution Use as directed 5 mLs in the mouth or throat as needed for mouth pain. Gargle and spit 5mL up to three times daily as needed  for throat pain. (Patient not taking: Reported on 02/20/2023)   No facility-administered encounter medications on file as of 03/26/2023.    ALLERGIES: Allergies  Allergen Reactions   Amoxicillin-Pot Clavulanate Nausea And Vomiting, Other (See Comments), Itching and Nausea Only    Has patient had a PCN reaction causing immediate rash, facial/tongue/throat swelling, SOB or lightheadedness with hypotension: No Has patient had a PCN reaction causing severe rash involving mucus membranes or skin necrosis: No Has patient had a PCN reaction that required hospitalization: No Has patient had a PCN reaction occurring within the last 10 years: No If all of the above answers are "NO", then may proceed with Cephalosporin use. Has patient had a PCN reaction causing immediate rash, facial/tongue/throat swelling, SOB or lightheadedness with hypotension: No Has patient had a PCN reaction causing severe rash involving mucus membranes or skin necrosis: No Has patient had a PCN reaction that required hospitalization: No Has patient had a PCN reaction occurring within the last 10 years: No If all of the above answers are "NO", then may proceed with Cephalosporin use.    Ace Inhibitors Cough   Guaifenesin Hives and Other (See Comments)   Lisinopril Cough and Other (See Comments)   Cetirizine    Clarithromycin     Made her feel crazy.    Entex La Itching   Gabapentin Other (See Comments)    Foggy head   Levofloxacin Hives and Itching   Meloxicam Other (See Comments)    (MOBIC) Unknown reaction type (maybe nausea?)   VACCINATION STATUS: Immunization History  Administered Date(s) Administered   Influenza Whole 09/01/2016   Influenza,  High Dose Seasonal PF 09/11/2018, 11/02/2019, 09/20/2021   Influenza-Unspecified 08/02/2013, 08/29/2015   PFIZER(Purple Top)SARS-COV-2 Vaccination 12/16/2019, 01/17/2020, 09/28/2020   Pneumococcal Conjugate-13 08/31/2019   Tdap 10/13/2022   Zoster Recombinat (Shingrix)  08/31/2019     HPI  Thyroid Problem Presents for follow-up visit. Symptoms include anxiety. Patient reports no cold intolerance, constipation, depressed mood, fatigue, heat intolerance, palpitations, tremors, weight gain or weight loss. The symptoms have been stable.    [name]  is a patient with the above medical history.  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    I reviewed patient's thyroid tests:  Lab Results  Component Value Date   TSH 2.300 03/18/2023   TSH 3.140 09/16/2022   TSH 1.740 03/18/2022   TSH 2.670 09/19/2021   TSH 2.040 05/14/2021   TSH 6.350 (H) 02/08/2021   TSH 2.39 09/28/2020   TSH 0.60 07/25/2020   FREET4 1.09 03/18/2023   FREET4 1.23 09/16/2022   FREET4 1.38 03/18/2022   FREET4 1.09 09/19/2021   FREET4 1.36 05/14/2021   FREET4 1.21 02/08/2021   FREET4 1.4 09/28/2020     Pt denies feeling nodules in neck, hoarseness, dysphagia/odynophagia, SOB with lying down.  she has No history of radiation therapy to head or neck.  No recent use of iodine supplements.  I reviewed her chart and she also has a history of GERD with ulcers, depression/anxiety, anemia, asthma, and high blood pressure.   Review of systems  Constitutional: + slowly increasing body weight,  current Body mass index is 26.13 kg/m. , + fatigue-stable, no subjective hyperthermia, no subjective hypothermia Eyes: no blurry vision, no xerophthalmia ENT: no sore throat, no nodules palpated in throat, no dysphagia/odynophagia, no hoarseness Cardiovascular: no chest pain, no shortness of breath, no palpitations, no leg swelling Respiratory: no cough, no shortness of breath Gastrointestinal: no nausea/vomiting/diarrhea Musculoskeletal: no muscle/joint aches Skin: no rashes, no hyperemia Neurological: no tremors, no numbness, no tingling, no dizziness Psychiatric: no depression, no anxiety, has significant amount of stress caring for her husband with  Alzheimers  ------------------------------------------------------------------------------------------------------------------ OBJECTIVE:  BP 122/84 (BP Location: Left Arm, Patient Position: Sitting, Cuff Size: Large)   Pulse 64   Ht  (1.626 m)   Wt 152 lb 3.2 oz (69 kg)   BMI 26.13 kg/m  Wt Readings from Last 3 Encounters:  03/26/23 152 lb 3.2 oz (69 kg)  02/20/23 150 lb 6.4 oz (68.2 kg)  01/14/23 148 lb 9.6 oz (67.4 kg)   BP Readings from Last 3 Encounters:  03/26/23 122/84  02/20/23 130/76  01/14/23 (!) 158/109     Physical Exam- Limited  Constitutional:  Body mass index is 26.13 kg/m. , not in acute distress, normal state of mind Eyes:  EOMI, no exophthalmos Musculoskeletal: no gross deformities, strength intact in all four extremities, no gross restriction of joint movements Skin:  no rashes, no hyperemia Neurological: no tremor with outstretched hands   CMP ( most recent) CMP     Component Value Date/Time   NA 137 02/20/2023 1337   NA 141 09/24/2018 1204   K 3.4 (L) 02/20/2023 1337   CL 100 02/20/2023 1337   CO2 28 02/20/2023 1337   GLUCOSE 96 02/20/2023 1337   BUN 20 02/20/2023 1337   BUN 13 09/24/2018 1204   CREATININE 0.90 02/20/2023 1337   CREATININE 0.94 (H) 01/22/2021 1419   CALCIUM 9.1 02/20/2023 1337   PROT 7.2 02/20/2023 1337   PROT 6.5 09/24/2018 1204  ALBUMIN 4.1 02/20/2023 1337   ALBUMIN 4.3 09/24/2018 1204   AST 24 02/20/2023 1337   ALT 18 02/20/2023 1337   ALKPHOS 74 02/20/2023 1337   BILITOT 0.8 02/20/2023 1337   BILITOT 0.4 09/24/2018 1204   GFRNONAA >60 02/20/2023 1337   GFRAA >60 05/08/2020 1232     Diabetic Labs (most recent): No results found for: "HGBA1C", "MICROALBUR"   Lipid Panel ( most recent) Lipid Panel     Component Value Date/Time   TRIG 92 09/11/2020 1547       Lab Results  Component Value Date   TSH 2.300 03/18/2023   TSH 3.140 09/16/2022   TSH 1.740 03/18/2022   TSH 2.670 09/19/2021   TSH 2.040  05/14/2021   TSH 6.350 (H) 02/08/2021   TSH 2.39 09/28/2020   TSH 0.60 07/25/2020   FREET4 1.09 03/18/2023   FREET4 1.23 09/16/2022   FREET4 1.38 03/18/2022   FREET4 1.09 09/19/2021   FREET4 1.36 05/14/2021   FREET4 1.21 02/08/2021   FREET4 1.4 09/28/2020      Latest Reference Range & Units 05/14/21 12:22 09/19/21 14:46 03/18/22 13:52 09/16/22 15:20 03/18/23 11:01  TSH 0.450 - 4.500 uIU/mL 2.040 2.670 1.740 3.140 2.300  T4,Free(Direct) 0.82 - 1.77 ng/dL 4.40 3.47 4.25 9.56 3.87   --------------------------------------------------------------------------------------------------------------------   ASSESSMENT / PLAN: 1. Hypothyroidism- acquired   Patient with long-standing hypothyroidism, on levothyroxine therapy. On physical exam, patient does not have gross goiter, thyroid nodules, or neck compression symptoms.  Thyroid US WNL for age.  -Her previsit thyroid function tests are consistent with appropriate hormone replacement.  She is advised to continue Levothyroxine 50 mcg po daily before breakfast. She notes she was recently diagnosed with Afib, therefore changes will be made slowly to prevent thyroid contribution.  - We discussed about correct intake of levothyroxine, at fasting, with water, separated by at least 30 minutes from breakfast. -Patient is made aware of the fact that thyroid hormone replacement is needed for life, dose to be adjusted by periodic monitoring of thyroid function tests.    I spent  26  minutes in the care of the patient today including review of labs from Thyroid Function, CMP, and other relevant labs ; imaging/biopsy records (current and previous including abstractions from other facilities); face-to-face time discussing  her lab results and symptoms, medications doses, her options of short and long term treatment based on the latest standards of care / guidelines;   and documenting the encounter.  Marita Kansas  participated in the discussions,  expressed understanding, and voiced agreement with the above plans.  All questions were answered to her satisfaction. she is encouraged to contact clinic should she have any questions or concerns prior to her return visit.   Follow Up Plan: Return in about 6 months (around 09/25/2023) for Thyroid follow up, Previsit labs.    Ronny Bacon, [facility] [address] [place], [place] [address] Phone: [phone] Fax: [phone]  03/26/2023, 11:13 AM

## 2023-03-28 ENCOUNTER — Encounter: Payer: Self-pay | Admitting: Allergy & Immunology

## 2023-03-28 ENCOUNTER — Ambulatory Visit: Payer: Medicare HMO | Admitting: Allergy & Immunology

## 2023-03-28 ENCOUNTER — Other Ambulatory Visit: Payer: Self-pay

## 2023-03-28 VITALS — BP 120/70 | HR 93 | Temp 97.9°F | Resp 16 | Ht 64.0 in | Wt 152.8 lb

## 2023-03-28 DIAGNOSIS — J324 Chronic pansinusitis: Secondary | ICD-10-CM

## 2023-03-28 DIAGNOSIS — J302 Other seasonal allergic rhinitis: Secondary | ICD-10-CM

## 2023-03-28 DIAGNOSIS — J4540 Moderate persistent asthma, uncomplicated: Secondary | ICD-10-CM | POA: Diagnosis not present

## 2023-03-28 MED ORDER — FLUTICASONE PROPIONATE 50 MCG/ACT NA SUSP: 2.0000 | Freq: Every morning | NASAL | 5 refills | Status: DC

## 2023-03-28 MED ORDER — AZELASTINE HCL 0.1 % NA SOLN: 2.0000 | Freq: Every day | NASAL | 5 refills | Status: DC

## 2023-03-28 NOTE — Progress Notes (Unsigned)
FOLLOW UP  Date of Service/Encounter:  03/28/23   Assessment:   Mild persistent asthma without complication   Seasonal and perennial allergic rhinitis (grasses, indoor molds, outdoor molds, dust mites, and cockroach) - would likely benefit from allergen immunotherapy, although her duties at home taking care of her husband likely keep that from being an effective or easy treatment at this point in time   Remote history of smoking    Social stressors at home - husband with Alzheimers   Recent diagnosis of atrial fibrillation    Plan/Recommendations:   1. Mild persistent asthma without complication - Lung testing looked fairly good today.  - Dr. Sherene Sires seems to have this under good control. - Continue with the Symbicort two puffs TWICE DAILY.  - GOOD LUCK with the cardioversion on May 3rd!   2. Seasonal and perennial allergic rhinitis - well controlled on the nose sprays only - Previous testing showed: grasses, indoor molds, outdoor molds, dust mites, and cockroach - Continue with:   Flonase (fluticasone) one spray per nostril one to two times daily Astelin (azelastine) 2 sprays per nostril one to two times daily   - IF NEEDED FOR RUNNY NOSE: Atrovent (ipratropium) 2 sprays in each nostril EVERY 8 hours as needed - Consider allergy shots as a means of long-term control. - Allergy shots "re-train" and "reset" the immune system to ignore environmental allergens and decrease the resulting immune response to those allergens (sneezing, itchy watery eyes, runny nose, nasal congestion, etc).    - Allergy shots improve symptoms in 75-85% of patients.  - We can discuss more at the next appointment if the medications are not working for you.  3. Recurrent infections  - Immune work up was completely normal.  - We do not need to do any further workup at this time.   4. Return in about 6 months (around 09/27/2023).     Subjective:   [name] is a 82 y.o. female presenting  today for follow up of  Chief Complaint  Patient presents with   Asthma    Since her last visit was diagnosed with Afib - she has an appointment to get her heart back in [place]    Other    Go over nasal sprays and use     Hetty Blend Berwanger has a history of the following: Patient Active Problem List   Diagnosis Date Noted   Unspecified atrial flutter (HCC) 02/20/2023   Seasonal and perennial allergic rhinitis 10/16/2022   Gastritis and gastroduodenitis 06/14/2021   Gastric ulcer 04/13/2021   Abdominal pain 03/15/2021   Fracture of femoral neck, right (HCC) 01/31/2021   Closed displaced fracture of right femoral neck (HCC) 01/31/2021   Indigestion 11/02/2020   Change in stool caliber 11/02/2020   Loss of weight 08/23/2020   Nausea without vomiting 06/15/2020   Tick bite 04/10/2020   Medication refill 04/10/2020   Acquired trigger finger 03/28/2020   Pain in right knee 10/08/2019   Acute maxillary sinusitis 08/20/2019   Cellulitis, leg 08/10/2019   Cellulitis 08/08/2019   Fever 08/08/2019   Cellulitis of foot 08/08/2019   Hypokalemia 08/08/2019   Hyponatremia 08/08/2019   Essential hypertension 08/08/2019   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  [name] is a 82 y.o. female presenting for a follow up visit.  She was last seen in January 2024.  At that time, lung testing was not done because she was sick.  She was under  the care of Dr. Sherene Sires and we continue with Symbicort 2 puffs twice daily.  For her allergic rhinitis, we continue with Xyzal or Allegra in the morning as well as Singulair, Flonase twice a day, Astelin twice a day, and Atrovent as needed.  We did discuss allergen immunotherapy.  For her history of recurrent sinusitis, we started her on clarithromycin twice daily for 7 days.  We obtained immune labs as well has a sinus CT.  Her labs were largely reassuring.  Her complete blood count shows stable anemia.  Immunoglobulin levels were normal.  She was protective to diphtheria, tetanus, and a significant number of streptococcal strains.  I did not feel there was any additional workup needed at this point.  A sinus CT was largely normal as well.  Results are shown below:  FINDINGS: Osseous: No fracture or bone lesion.   Orbits: Globes and orbits are unremarkable.   Sinuses: Clear sinuses.  Patent ostiomeatal complexes.   Soft tissues: No soft tissue mass, inflammation or enlarged lymph nodes.   Limited intracranial: No significant or unexpected finding.   IMPRESSION: 1. Clear sinuses.  Since the last visit, she has mostly done well.   Asthma/Respiratory Symptom History: She remains on her Symbicort from Dr. Sherene Sires. She does think that the Symbicort has been  helpful with her breathing. But she made her own appointment with the Cardiologist because she was having trouble walking around the neighborhood. Because of this recent appointment, she was recently diagnosed with atrial fibrillation. She is going to undergo a cardioversion on May 3rd. She is nervous about this procedure, but she is ready to have it done.   Allergic Rhinitis Symptom History: She did do some cleaning of her windows where she got rid of some pollen and "black stuff". She did not think about mold, but she cleaned it with soapy water to help with things. The following morning, she felt congested.  She is feeling better today. She has  two nose sprays right now. She is using the Astelin and Flonase one spray per nostril daily. She is not taking any pills from [place]. She looks confused when I ask her about pills, so I do not think that she is taking her antihistamine. She does not think that she needs allergy shots at this point. Overall her frequency of sinus related complaints has decreased over time.   Otherwise, there have been no changes to her past medical history, surgical history, family history, or social history.    Review of Systems  Constitutional:  Negative for chills, fever and weight loss.  HENT:  Negative for congestion, sinus pain and sore throat.        Reports rhinorrhea when she finished the Mucinex, but then has not had rhinorrhea for a week when she stopped.  She also reports postnasal drip that she thinks is causing her cough.  She denies nasal congestion.  Eyes:        Denies  itchy watery eyes  Respiratory:  Negative for cough, shortness of breath and wheezing.        Reports productive cough which has been green for the past 4 to 5 days.  Prior to that it was yellow.  Also reports cough occurs at night.  Denies wheezing, tightness in chest, and shortness of breath.  Cardiovascular:  Negative for chest pain and palpitations.  Gastrointestinal:  Negative for abdominal pain, constipation, diarrhea, nausea and vomiting.       Reports 1 time having heartburn after eating asparagus.  Otherwise has not had heartburn or reflux  Genitourinary:  Negative for frequency.  Skin:  Negative for itching and rash.  Neurological:  Negative for dizziness, tingling, tremors, sensory change, speech change and headaches.       Reports a headache this morning and took over-the-counter Tylenol  Endo/Heme/Allergies:  Negative for environmental allergies. Does not bruise/bleed easily.       Objective:   Blood pressure 120/70, pulse 93, temperature 97.9 F (36.6 C), resp. rate 16, height 5\' 4"  (1.626 m), weight 152 lb 12.8  oz (69.3 kg), SpO2 97 %. Body mass index is 26.23 kg/m.    Physical Exam Vitals reviewed.  Constitutional:      Appearance: She is well-developed.  HENT:     Head: Normocephalic and atraumatic.     Right Ear: Tympanic membrane, ear canal and external ear normal.     Left Ear: Tympanic membrane, ear canal and external ear normal.     Nose: Congestion present. No nasal deformity, septal deviation, mucosal edema or rhinorrhea.     Right Turbinates: Enlarged, swollen and pale.     Left Turbinates: Enlarged, swollen and pale.     Right Sinus: No maxillary sinus tenderness or frontal sinus tenderness.     Left Sinus: No maxillary sinus tenderness or frontal sinus tenderness.     Comments: No nasal polyps noted.     Mouth/Throat:     Lips: Pink.     Mouth: Mucous membranes are moist. Mucous membranes are not pale and not dry.     Pharynx: Uvula midline.  Eyes:     General: Lids are normal. No allergic shiner.       Right eye: No discharge.        Left eye: No discharge.     Conjunctiva/sclera: Conjunctivae normal.     Right eye: Right conjunctiva is not injected. No chemosis.    Left eye: Left conjunctiva is not injected. No chemosis.    Pupils: Pupils are equal, round, and reactive to light.  Cardiovascular:     Rate and Rhythm: Normal rate and regular rhythm.     Heart sounds: Normal heart sounds.  Pulmonary:     Effort: Pulmonary effort is normal. No tachypnea, accessory muscle usage or respiratory distress.     Breath sounds: Normal breath sounds. No wheezing, rhonchi or rales.  Chest:     Chest wall: No tenderness.  Lymphadenopathy:     Cervical: No cervical adenopathy.  Skin:    Coloration: Skin is not pale.     Findings: No abrasion, erythema, petechiae or rash. Rash is not papular, urticarial or vesicular.  Neurological:     Mental Status: She is alert.  Psychiatric:        Behavior: Behavior is cooperative.      Diagnostic studies:    Spirometry: results normal  (FEV1: 1.49/78%, FVC: 2.53/100%, FEV1/FVC: 56%).    Spirometry consistent with moderate obstructive disease.  Allergy Studies: none        Salvatore Marvel, MD  [facility]

## 2023-03-28 NOTE — Patient Instructions (Addendum)
1. Mild persistent asthma without complication - Lung testing looked fairly good today.  - Dr. Sherene Sires seems to have this under good control. - Continue with the Symbicort two puffs TWICE DAILY.  - GOOD LUCK with the cardioversion on May 3rd!   2. Seasonal and perennial allergic rhinitis - well controlled on the nose sprays only - Previous testing showed: grasses, indoor molds, outdoor molds, dust mites, and cockroach - Continue with:   Flonase (fluticasone) one spray per nostril one to two times daily Astelin (azelastine) 2 sprays per nostril one to two times daily   - IF NEEDED FOR RUNNY NOSE: Atrovent (ipratropium) 2 sprays in each nostril EVERY 8 hours as needed - Consider allergy shots as a means of long-term control. - Allergy shots "re-train" and "reset" the immune system to ignore environmental allergens and decrease the resulting immune response to those allergens (sneezing, itchy watery eyes, runny nose, nasal congestion, etc).    - Allergy shots improve symptoms in 75-85% of patients.  - We can discuss more at the next appointment if the medications are not working for you.  3. Recurrent infections  - Immune work up was completely normal.  - We do not need to do any further workup at this time.   4. Return in about 6 months (around 09/27/2023).    Please inform us of any Emergency Department visits, hospitalizations, or changes in symptoms. Call us before going to the ED for breathing or allergy symptoms since we might be able to fit you in for a sick visit. Feel free to contact us anytime with any questions, problems, or concerns.  It was a pleasure to meet you today! You are a DOLL!   Websites that have reliable patient information: 1. American Academy of Asthma, Allergy, and Immunology: www.aaaai.org 2. Food Allergy Research and Education (FARE): foodallergy.org 3. Mothers of Asthmatics: http://www.asthmacommunitynetwork.org 4. American College of Allergy, Asthma, and  Immunology: www.acaai.org   COVID-19 Vaccine Information can be found at: [email or web address].[email or web address] For questions related to vaccine distribution or appointments, please email [email or web address] or call [phone].   We realize that you might be concerned about having an allergic reaction to the COVID19 vaccines. To help with that concern, WE ARE OFFERING THE COVID19 VACCINES IN OUR OFFICE! Ask the front desk for dates!     "Like" [place] on Facebook and Instagram for our latest updates!      A healthy democracy works best when [employer] participate! Make sure you are registered to vote! If you have moved or changed any of your contact information, you will need to get this updated before voting!  In some cases, you MAY be able to register to vote online: [email or web address]

## 2023-03-31 ENCOUNTER — Encounter: Payer: Self-pay | Admitting: Allergy & Immunology

## 2023-04-01 NOTE — Patient Instructions (Addendum)
[name]  04/01/2023     @PREFPERIOPPHARMACY @   Your procedure is scheduled on  04/04/2023.   Report to [facility] at  [phone]  A.M.   Call this number if you have problems the morning of surgery:  [phone]  If you experience any cold or flu symptoms such as cough, fever, chills, shortness of breath, etc. between now and your scheduled surgery, please notify us at the above number.   Remember:  Do not eat or drink after midnight.      Take these medicines the morning of surgery with A SIP OF WATER            xanax, amlodipine, levothyroxine, pantoprazole, evista, effexor.     Do not wear jewelry, make-up or nail polish.  Do not wear lotions, powders, or perfumes, or deodorant.  Do not shave 48 hours prior to surgery.  Men may shave face and neck.  Do not bring valuables to the hospital.  [facility] is not responsible for any belongings or valuables.  Contacts, dentures or bridgework may not be worn into surgery.  Leave your suitcase in the car.  After surgery it may be brought to your room.  For patients admitted to the hospital, discharge time will be determined by your treatment team.  Patients discharged the day of surgery will not be allowed to drive home and must have someone with them for 24 hours.    Special instructions:   DO NOT smoke tobacco or vape for 24 hours before your procedure.  Please read over the following fact sheets that you were given. Anesthesia Post-op Instructions and Care and Recovery After Surgery       Electrical Cardioversion Electrical cardioversion is the delivery of a jolt of electricity to restore a normal rhythm to the heart. A rhythm that is too fast or is not regular (arrhythmia) keeps the heart from pumping blood well. There is also another type of cardioversion called a chemical (pharmacologic) cardioversion. This is when your health care provider gives you one or more medicines to bring back your regular heart  rhythm. Electrical cardioversion is done as a scheduled procedure for arrhythmiasthat are not life-threatening. Electrical cardioversion may also be done in an emergency for sudden life-threatening arrhythmias. Tell a health care provider about: Any allergies you have. All medicines you are taking, including vitamins, herbs, eye drops, creams, and over-the-counter medicines. Any problems you or family members have had with sedatives or anesthesia. Any bleeding problems you have. Any surgeries you have had, including a pacemaker, defibrillator, or other implanted device. Any medical conditions you have. Whether you are pregnant or may be pregnant. What are the risks? Your provider will talk with you about risks. These include: Allergic reactions to medicines. Irritation to the skin on your chest or back where the sticky pads (electrodes) or paddles were put during electrical cardioversion. A blood clot that breaks free and travels to other parts of your body, such as your brain. Return of a worse abnormal heart rhythm that will need to be treated with medicines, a pacemaker, or an implantable cardioverter defibrillator (ICD). What happens before the procedure? Medicines Your provider may give you: Blood-thinning medicines (anticoagulants) so your blood does not clot as easily. If your provider gives you this medicine, you may need to take it for 4 weeks before the procedure. Medicines to help stabilize your heart rate and rhythm. Ask your provider about: Changing or stopping your regular medicines.  These include any diabetes medicines or blood thinners you take. Taking medicines such as aspirin and ibuprofen. These medicines can thin your blood. Do not take them unless your provider tells you to. Taking over-the-counter medicines, vitamins, herbs, and supplements. General instructions Follow instructions from your provider about what you may eat and drink. Do not put any lotions, powders,  or ointments on your chest and back for 24 hours before the procedure. They can cause problems with the electrodes or paddles used to deliver electricity to your heart. Do not wear jewelry as this can interfere with delivering electricity to your heart. If you will be going home right after the procedure, plan to have a responsible adult: Take you home from the hospital or clinic. You will not be allowed to drive. Care for you for the time you are told. Tests You may have an exam or testing. This may include: Blood labs. A transesophageal echocardiogram (TEE). What happens during the procedure?     An IV will be inserted into one of your veins. You will be given a sedative. This helps you relax. Electrodes or metal paddles will be placed on your chest. They may be placed in one of these ways: One placed on your right chest, the other on the left ribs. One placed on your chest and the other on your back. An electrical shock will be delivered. The shock briefly stops (resets) your heart rhythm. Your provider will check to see if your heart rhythm is now normal. Some people need only one shock. Some need more to restore a normal heart rhythm. The procedure may vary among providers and hospitals. What happens after the procedure? Your blood pressure, heart rate, breathing rate, and blood oxygen level will be monitored until you leave the hospital or clinic. Your heart rhythm will be watched to make sure it does not change. This information is not intended to replace advice given to you by your health care provider. Make sure you discuss any questions you have with your health care provider. Document Revised: 07/11/2022 Document Reviewed: 07/11/2022 Elsevier Patient Education  2023 Elsevier Inc. Monitored Anesthesia Care, Care After The following information offers guidance on how to care for yourself after your procedure. Your health care provider may also give you more specific  instructions. If you have problems or questions, contact your health care provider. What can I expect after the procedure? After the procedure, it is common to have: Tiredness. Little or no memory about what happened during or after the procedure. Impaired judgment when it comes to making decisions. Nausea or vomiting. Some trouble with balance. Follow these instructions at home: For the time period you were told by your health care provider:  Rest. Do not participate in activities where you could fall or become injured. Do not drive or use machinery. Do not drink alcohol. Do not take sleeping pills or medicines that cause drowsiness. Do not make important decisions or sign legal documents. Do not take care of children on your own. Medicines Take over-the-counter and prescription medicines only as told by your health care provider. If you were prescribed antibiotics, take them as told by your health care provider. Do not stop using the antibiotic even if you start to feel better. Eating and drinking Follow instructions from your health care provider about what you may eat and drink. Drink enough fluid to keep your urine pale yellow. If you vomit: Drink clear fluids slowly and in small amounts as you are able. Clear  fluids include water, ice chips, low-calorie sports drinks, and fruit juice that has water added to it (diluted fruit juice). Eat light and bland foods in small amounts as you are able. These foods include bananas, applesauce, rice, lean meats, toast, and crackers. General instructions  Have a responsible adult stay with you for the time you are told. It is important to have someone help care for you until you are awake and alert. If you have sleep apnea, surgery and some medicines can increase your risk for breathing problems. Follow instructions from your health care provider about wearing your sleep device: When you are sleeping. This includes during daytime naps. While  taking prescription pain medicines, sleeping medicines, or medicines that make you drowsy. Do not use any products that contain nicotine or tobacco. These products include cigarettes, chewing tobacco, and vaping devices, such as e-cigarettes. If you need help quitting, ask your health care provider. Contact a health care provider if: You feel nauseous or vomit every time you eat or drink. You feel light-headed. You are still sleepy or having trouble with balance after 24 hours. You get a rash. You have a fever. You have redness or swelling around the IV site. Get help right away if: You have trouble breathing. You have new confusion after you get home. These symptoms may be an emergency. Get help right away. Call 911. Do not wait to see if the symptoms will go away. Do not drive yourself to the hospital. This information is not intended to replace advice given to you by your health care provider. Make sure you discuss any questions you have with your health care provider. Document Revised: 04/15/2022 Document Reviewed: 04/15/2022 Elsevier Patient Education  2023 [employer].

## 2023-04-02 ENCOUNTER — Encounter (HOSPITAL_COMMUNITY)
Admission: RE | Admit: 2023-04-02 | Discharge: 2023-04-02 | Disposition: A | Discharge status: Home / Self Care | Payer: Medicare HMO | Source: Ambulatory Visit | Attending: Cardiology | Admitting: Cardiology

## 2023-04-02 ENCOUNTER — Ambulatory Visit (INDEPENDENT_AMBULATORY_CARE_PROVIDER_SITE_OTHER): Payer: Medicare HMO

## 2023-04-02 ENCOUNTER — Encounter (HOSPITAL_COMMUNITY): Payer: Self-pay

## 2023-04-02 VITALS — BP 120/70 | HR 93 | Temp 97.9°F | Resp 18 | Ht 64.0 in | Wt 152.8 lb

## 2023-04-02 DIAGNOSIS — E876 Hypokalemia: Secondary | ICD-10-CM | POA: Diagnosis not present

## 2023-04-02 DIAGNOSIS — Z87891 Personal history of nicotine dependence: Secondary | ICD-10-CM | POA: Diagnosis not present

## 2023-04-02 DIAGNOSIS — Z01818 Encounter for other preprocedural examination: Secondary | ICD-10-CM | POA: Diagnosis not present

## 2023-04-02 DIAGNOSIS — I4892 Unspecified atrial flutter: Secondary | ICD-10-CM | POA: Diagnosis not present

## 2023-04-02 DIAGNOSIS — I484 Atypical atrial flutter: Secondary | ICD-10-CM | POA: Insufficient documentation

## 2023-04-02 DIAGNOSIS — R609 Edema, unspecified: Secondary | ICD-10-CM | POA: Diagnosis not present

## 2023-04-02 DIAGNOSIS — I10 Essential (primary) hypertension: Secondary | ICD-10-CM | POA: Diagnosis not present

## 2023-04-02 DIAGNOSIS — I083 Combined rheumatic disorders of mitral, aortic and tricuspid valves: Secondary | ICD-10-CM | POA: Diagnosis not present

## 2023-04-02 HISTORY — DX: Cardiac arrhythmia, unspecified: I49.9

## 2023-04-02 HISTORY — DX: Unspecified atrial flutter: I48.92

## 2023-04-02 LAB — ECHOCARDIOGRAM COMPLETE
AR max vel: 1.56 cm2
AV Area VTI: 1.5 cm2
AV Area mean vel: 1.46 cm2
AV Mean grad: 4 mmHg
AV Peak grad: 6.6 mmHg
AV Vena cont: 0.3 cm
Ao pk vel: 1.28 m/s
Area-P 1/2: 3.78 cm2
Calc EF: 62.7 %
Height: 64 in
MV M vel: 3.69 m/s
MV Peak grad: 54.3 mmHg
P 1/2 time: 1462 msec
S' Lateral: 2.7 cm
Single Plane A2C EF: 62.6 %
Single Plane A4C EF: 59.9 %
Weight: 2444.81 oz

## 2023-04-02 LAB — BASIC METABOLIC PANEL
Anion gap: 8 (ref 5–15)
BUN: 17 mg/dL (ref 8–23)
CO2: 27 mmol/L (ref 22–32)
Calcium: 8.6 mg/dL — ABNORMAL LOW (ref 8.9–10.3)
Chloride: 103 mmol/L (ref 98–111)
Creatinine, Ser: 0.81 mg/dL (ref 0.44–1.00)
GFR, Estimated: >60 mL/min (ref >60)
Glucose, Bld: 87 mg/dL (ref 70–99)
Potassium: 3.6 mmol/L (ref 3.5–5.1)
Sodium: 138 mmol/L (ref 135–145)

## 2023-04-04 ENCOUNTER — Ambulatory Visit (HOSPITAL_COMMUNITY): Payer: Medicare HMO | Admitting: Anesthesiology

## 2023-04-04 ENCOUNTER — Ambulatory Visit (HOSPITAL_BASED_OUTPATIENT_CLINIC_OR_DEPARTMENT_OTHER): Payer: Medicare HMO | Admitting: Anesthesiology

## 2023-04-04 ENCOUNTER — Encounter (HOSPITAL_COMMUNITY): Payer: Self-pay | Admitting: Cardiology

## 2023-04-04 ENCOUNTER — Encounter (HOSPITAL_COMMUNITY)
Admission: RE | Disposition: A | Discharge status: Home / Self Care | Payer: Self-pay | Source: Ambulatory Visit | Attending: Cardiology

## 2023-04-04 ENCOUNTER — Ambulatory Visit (HOSPITAL_COMMUNITY)
Admission: RE | Admit: 2023-04-04 | Discharge: 2023-04-04 | Disposition: A | Discharge status: Home / Self Care | Payer: Medicare HMO | Source: Ambulatory Visit | Attending: Cardiology | Admitting: Cardiology

## 2023-04-04 DIAGNOSIS — I10 Essential (primary) hypertension: Secondary | ICD-10-CM

## 2023-04-04 DIAGNOSIS — F0284 Dementia in other diseases classified elsewhere, unspecified severity, with anxiety: Secondary | ICD-10-CM | POA: Insufficient documentation

## 2023-04-04 DIAGNOSIS — K219 Gastro-esophageal reflux disease without esophagitis: Secondary | ICD-10-CM | POA: Insufficient documentation

## 2023-04-04 DIAGNOSIS — Z87891 Personal history of nicotine dependence: Secondary | ICD-10-CM | POA: Diagnosis not present

## 2023-04-04 DIAGNOSIS — Z79899 Other long term (current) drug therapy: Secondary | ICD-10-CM | POA: Insufficient documentation

## 2023-04-04 DIAGNOSIS — I739 Peripheral vascular disease, unspecified: Secondary | ICD-10-CM | POA: Insufficient documentation

## 2023-04-04 DIAGNOSIS — J45909 Unspecified asthma, uncomplicated: Secondary | ICD-10-CM

## 2023-04-04 DIAGNOSIS — I4891 Unspecified atrial fibrillation: Secondary | ICD-10-CM | POA: Diagnosis not present

## 2023-04-04 DIAGNOSIS — G309 Alzheimer's disease, unspecified: Secondary | ICD-10-CM | POA: Diagnosis not present

## 2023-04-04 DIAGNOSIS — I4892 Unspecified atrial flutter: Secondary | ICD-10-CM

## 2023-04-04 DIAGNOSIS — Z79890 Hormone replacement therapy (postmenopausal): Secondary | ICD-10-CM | POA: Insufficient documentation

## 2023-04-04 DIAGNOSIS — E039 Hypothyroidism, unspecified: Secondary | ICD-10-CM | POA: Diagnosis not present

## 2023-04-04 DIAGNOSIS — F419 Anxiety disorder, unspecified: Secondary | ICD-10-CM | POA: Insufficient documentation

## 2023-04-04 DIAGNOSIS — Z7951 Long term (current) use of inhaled steroids: Secondary | ICD-10-CM | POA: Diagnosis not present

## 2023-04-04 HISTORY — PX: CARDIOVERSION: SHX1299

## 2023-04-04 SURGERY — CARDIOVERSION
Anesthesia: General

## 2023-04-04 MED ORDER — SODIUM CHLORIDE 0.9 % IV SOLN: INTRAVENOUS | Status: DC

## 2023-04-04 MED ORDER — PROPOFOL 10 MG/ML IV BOLUS
INTRAVENOUS | Status: DC | PRN
  Administered 2023-04-04: 50 mg via INTRAVENOUS

## 2023-04-04 MED ORDER — PROPOFOL 10 MG/ML IV BOLUS
INTRAVENOUS | Status: AC
  Filled 2023-04-04: qty 20

## 2023-04-04 MED ORDER — AMIODARONE HCL 200 MG PO TABS: ORAL_TABLET | ORAL | 0 refills | Status: DC

## 2023-04-04 NOTE — H&P (Signed)
Procedure H&P  Patient presents for outpatient electrical cardioversion for persistent atrial flutter. For full history please see referenced clinic note below. She is on eliquis for anticoagulation. Plan for cardioverstion today with the assistance of anesthesiology.  Sylvia Rich MD    Cardiology Office Note   Date: 02/20/2023    ID: Sylvia, French 01-05-1941, MRN [id number]   PCP:  Benita Stabile, MD           Cardiologist:  None Electrophysiologist:  None    Reason for Office Visit: Evaluation of abnormal electrocardiogram at the request of Sylvia Miner, NP     History of Present Illness: [name] is a 82 y.o. female known to have HTN was referred to cardiology clinic for abnormal electrocardiogram.   Patient takes care of her husband that has advanced Alzheimer's dementia.  He was diagnosed with Alzheimer's dementia in 2016 and has been worsening. As of now, her daughters and caretakers help take care of her husband. Patient says she was stressed out about taking care of her husband. She noticed feeling tired and short of breath since 12/2022 and does not think this could be from stress as her husband had Alzheimer's for many years. She had recent PCP visit and EKG showed flutter waves, per PCP documentation. She is currently on aspirin and not on any DOAC's.       Past Medical History:  Diagnosis Date   Anemia     Anxiety     Arthritis     Asthma     Constipation     GERD (gastroesophageal reflux disease)     High blood pressure     History of COVID-19 12/28/2020   Hypothyroidism     PAD (peripheral artery disease) (HCC)      bilateral legs   Peripheral edema     Peripheral neuropathy      bilateral feet at night occ   Psoriasis     Seasonal allergies     Skin cancer      Left eye brow   Varicose veins     Vertigo 11/23/2013           Past Surgical History:  Procedure Laterality Date   APPENDECTOMY        patient unsure   BIOPSY   12/21/2020     Procedure: BIOPSY;  Surgeon: Corbin Ade, MD;  Location: [facility];  Service: Endoscopy;;   BIOPSY   05/28/2021    Procedure: BIOPSY;  Surgeon: Lanelle Bal, DO;  Location: [facility];  Service: Endoscopy;;   CATARACT EXTRACTION W/PHACO Left 01/27/2017    Procedure: CATARACT EXTRACTION PHACO AND INTRAOCULAR LENS PLACEMENT (IOC);  Surgeon: Gemma Payor, MD;  Location: [facility];  Service: Ophthalmology;  Laterality: Left;  CDE: 29.90   CATARACT EXTRACTION W/PHACO Right 02/24/2017    Procedure: CATARACT EXTRACTION PHACO AND INTRAOCULAR LENS PLACEMENT (IOC);  Surgeon: Gemma Payor, MD;  Location: [facility];  Service: Ophthalmology;  Laterality: Right;  CDE: 8.57   CHOLECYSTECTOMY       COLONOSCOPY N/A 10/01/2016    Procedure: COLONOSCOPY;  Surgeon: Corbin Ade, MD; pancolonic diverticulosis, melanosis coli, otherwise normal exam.  No recommendations to repeat colonoscopy.   COLONOSCOPY WITH PROPOFOL N/A 12/21/2020    Rourk: Diverticulosis   ESOPHAGOGASTRODUODENOSCOPY (EGD) WITH PROPOFOL N/A 12/21/2020    Rourk: Erosive reflux esophagitis, nonbleeding gastric ulcers with biopsy showing erosion/ulceration, negative for H. pylori.   ESOPHAGOGASTRODUODENOSCOPY (EGD) WITH PROPOFOL N/A 05/28/2021  Carver: gastritis (reactive gastropathy), no H.pylori   FLEXOR TENDON REPAIR Right 11/06/2017    Procedure: RIGHT WRIST FLEXOR TENDON REPAIRAND STT DEBRIEDMENT;  Surgeon: Betha Loa, MD;  Location: [facility];  Service: Orthopedics;  Laterality: Right;   MOUTH SURGERY        artery bleed   TOTAL HIP ARTHROPLASTY Right 01/31/2021    Procedure: TOTAL HIP ARTHROPLASTY ANTERIOR APPROACH;  Surgeon: Samson Frederic, MD;  Location: [facility];  Service: Orthopedics;  Laterality: Right;   TUBAL LIGATION                Current Outpatient Medications  Medication Sig Dispense Refill   acetaminophen (TYLENOL) 500 MG tablet Take 500-1,000 mg by mouth as needed for mild pain or moderate pain.        albuterol (VENTOLIN HFA) 108 (90 Base) MCG/ACT inhaler Inhale 2 puffs into the lungs every 6 (six) hours as needed for wheezing or shortness of breath. 8 g 1   ALPRAZolam (XANAX) 0.5 MG tablet Take 0.5 mg by mouth in the morning, at noon, and at bedtime. (1000, 1500, AT BEDTIME)       amLODipine (NORVASC) 2.5 MG tablet Take 2.5 mg by mouth 2 (two) times daily.       aspirin EC 81 MG tablet Take 81 mg by mouth daily. Swallow whole.       azelastine (ASTELIN) 0.1 % nasal spray Use 2 sprays in each nostril 1-2 times a day as needed for drainage down your throat/runny nose 30 mL 5   budesonide-formoterol (SYMBICORT) 80-4.5 MCG/ACT inhaler Inhale 2 puffs into the lungs 2 (two) times daily. 10.2 g 5   Cholecalciferol (VITAMIN D3) 50 MCG (2000 UT) capsule Take 2,000 Units by mouth daily.       diclofenac Sodium (VOLTAREN) 1 % GEL Apply 1 application topically as needed (back pain).       ELDERBERRY PO Take by mouth.       famotidine (PEPCID) 20 MG tablet One after supper 30 tablet 11   ipratropium (ATROVENT) 0.06 % nasal spray Place 2 sprays into both nostrils 4 (four) times daily as needed for rhinitis. 15 mL 5   levothyroxine (SYNTHROID) 50 MCG tablet Take 1 tablet (50 mcg total) by mouth daily before breakfast. 90 tablet 3   losartan (COZAAR) 50 MG tablet Take 50 mg by mouth 2 (two) times daily.       Multiple Vitamin (MULTIVITAMIN WITH MINERALS) TABS tablet Take 1 tablet by mouth daily in the afternoon.       pantoprazole (PROTONIX) 40 MG tablet TAKE 1 TABLET BY MOUTH ONCE A DAY. 30 tablet 11   polyethylene glycol (MIRALAX / GLYCOLAX) 17 g packet Take 17 g by mouth daily.       venlafaxine XR (EFFEXOR-XR) 75 MG 24 hr capsule venlafaxine ER 75 mg capsule,extended release 24 hr       ZINC CITRATE PO Take by mouth.       guaiFENesin (MUCINEX PO) Take by mouth as needed. (Patient not taking: Reported on 02/20/2023)       hydrALAZINE (APRESOLINE) 25 MG tablet Take 25 mg by mouth 2 (two) times daily.  (Patient not taking: Reported on 02/20/2023)       lidocaine (XYLOCAINE) 2 % solution Use as directed 5 mLs in the mouth or throat as needed for mouth pain. Gargle and spit 5mL up to three times daily as needed for throat pain. (Patient not taking: Reported on 02/20/2023) 100 mL 0  raloxifene (EVISTA) 60 MG tablet Take 1 tablet (60 mg total) by mouth every morning. (Patient taking differently: Take 60 mg by mouth every other day. IN THE MORNING) 30 tablet 12    No current facility-administered medications for this visit.    Allergies:  Amoxicillin-pot clavulanate, Ace inhibitors, Guaifenesin, Lisinopril, Cetirizine, Clarithromycin, Entex la, Gabapentin, Levofloxacin, and Meloxicam    Social History: The patient  reports that she quit smoking about 25 years ago. Her smoking use included cigarettes. She has a 1.25 pack-year smoking history. She has been exposed to tobacco smoke. She has never used smokeless tobacco. She reports that she does not currently use alcohol after a past usage of about 1.0 standard drink of alcohol per week. She reports that she does not use drugs.    Family History: The patient's family history includes COPD in her brother; Heart attack in her father; Heart disease in her father; Hypertension in her brother and mother; Varicose Veins in her father.    ROS:  Please see the history of present illness. Otherwise, complete review of systems is positive for none.  All other systems are reviewed and negative.    Physical Exam: VS:  Ht 5\' 4"  (1.626 m)   Wt 150 lb 6.4 oz (68.2 kg)   BMI 25.82 kg/m , BMI Body mass index is 25.82 kg/m.      Wt Readings from Last 3 Encounters:  02/20/23 150 lb 6.4 oz (68.2 kg)  01/14/23 148 lb 9.6 oz (67.4 kg)  11/27/22 154 lb 12.8 oz (70.2 kg)    General: Patient appears comfortable at rest. HEENT: Conjunctiva and lids normal, oropharynx clear with moist mucosa. Neck: Supple, no elevated JVP or carotid bruits, no thyromegaly. Lungs: Clear  to auscultation, nonlabored breathing at rest. Cardiac: Regular rate and rhythm, no S3 or significant systolic murmur, no pericardial rub. Abdomen: Soft, nontender, no hepatomegaly, bowel sounds present, no guarding or rebound. Extremities: No pitting edema, distal pulses 2+. Skin: Warm and dry. Musculoskeletal: No kyphosis. Neuropsychiatric: Alert and oriented x3, affect grossly appropriate.   ECG: Atrial flutter on 02/20/2023, rate controlled   Recent Labwork: 08/01/2022: BNP 93.9; Platelets 215 09/16/2022: TSH 3.140 12/26/2022: Hemoglobin 11.0  Labs (Brief)          Component Value Date/Time    TRIG 92 09/11/2020 1547        Other Studies Reviewed Today: I personally reviewed the heart care referral records from the PCP   Assessment and Plan: Patient is a 82 year old F known to have HTN was referred to cardiology clinic for evaluation of abnormal electrocardiogram.   # Persistent atrial flutter, CV score 3 -EKG today showed atrial flutter, HR 80 bpm. Patient has been having fatigue and SOB x 2 months with new onset of rate controlled atrial flutter.  She will benefit from rhythm control strategy with DCCV. Discontinue aspirin and start Eliquis 5 mg twice daily. I spoke with the daughter, Rodell Perna who stated that patient had imbalance issues because of low blood pressure and did not have a real fall. Obtain CMP and 2D echocardiogram. Patient's daughter would like to the procedure to be scheduled after May 12/2022.  Patient and her daughter voiced understanding that she should not miss any doses of Eliquis in the 4 weeks prior to the DCCV procedure. After successful DCCV, she will need to be started on amiodarone 200 mg twice daily x 3 weeks followed by amiodarone 200 mg once daily.  Not on any rate controlling agents as  of now. -The risks, benefits and alternatives for the DCCV procedure were discussed with the patient and her daughter Rodell Perna over the phone), the patient/daughter  comprehended these risks and is willing to proceed.  Risks include, but are not limited to, chest pain, aspiration, cardiac arrest, possibility of PPM implantation. -Outpatient OSA evaluation   # HTN, controlled -Continue amlodipine 2.5 mg twice daily -HTN management per PCP   I have spent a total of 45 minutes with patient reviewing chart, EKGs, labs and examining patient as well as establishing an assessment and plan that was discussed with the patient.  > 50% of time was spent in direct patient care.       Medication Adjustments/Labs and Tests Ordered: Current medicines are reviewed at length with the patient today.  Concerns regarding medicines are outlined above.    Tests Ordered:    Orders Placed This Encounter  Procedures   Comprehensive Metabolic Panel (CMET)   EKG 12-Lead   ECHOCARDIOGRAM COMPLETE      Medication Changes: No orders of the defined types were placed in this encounter.     Disposition:  Follow up  1 month after DCCV   Signed, Vishnu Verne Spurr, MD, 02/20/2023 11:41 AM    [facility] [address] [place], [place] [address]

## 2023-04-04 NOTE — Transfer of Care (Signed)
Immediate Anesthesia Transfer of Care Note  Patient: [name]  Procedure(s) Performed: CARDIOVERSION  Patient Location: PACU  Anesthesia Type:MAC  Level of Consciousness: awake, alert , and oriented  Airway & Oxygen Therapy: Patient Spontanous Breathing and Patient connected to nasal cannula oxygen  Post-op Assessment: Report given to RN and Post -op Vital signs reviewed and stable  Post vital signs: Reviewed and stable  Last Vitals:  Vitals Value Taken Time  BP 153/89   Temp 98   Pulse 77   Resp 16   SpO2 100     Last Pain:  Vitals:   04/04/23 0723  TempSrc: Oral  PainSc: 0-No pain         Complications: No notable events documented.

## 2023-04-04 NOTE — Progress Notes (Signed)
Electrical Cardioversion Procedure Note [name] [id number] 02-11-1941  Procedure: Electrical Cardioversion Indications:  Atrial Flutter  Procedure Details Consent: Risks of procedure as well as the alternatives and risks of each were explained to the (patient/caregiver).  Consent for procedure obtained. Time Out: Verified patient identification, verified procedure, site/side was marked, verified correct patient position, special equipment/implants available, medications/allergies/relevent history reviewed, required imaging and test results available.  Performed  Patient placed on cardiac monitor, pulse oximetry, supplemental oxygen as necessary.  Sedation given:  propofol Pacer pads placed anterior and posterior chest.  Cardioverted 1 time(s).  Cardioverted at 200J.  Evaluation Findings: Post procedure EKG shows:  sinus rhythm with 1st degree AV block Complications: None Patient did tolerate procedure well.   [name] 04/04/2023, 8:43 AM

## 2023-04-04 NOTE — CV Procedure (Signed)
CV Procedure Note  Procedure: electrical cardioversion Physician: Dr [name] Indication: Persistent atrial flutter   Patient was brought to the procedure suite after appropriate consent was obtained. I confirmed with the patient she had not missed any doses of eliquis within the last 3 weeks.  Defib pads placed in the anterior and posterior positions. Sedation achieved with the assistance of anesthesiology, for details please refer to there documentation. Succesfully converted with a single 200j syncrhonized shock to normal sinus rhythm. Cardiopulmonary monitoring performed throughout the procedure, she tolerated well without complicatoins  Dr Jenene Slicker has asked patient be placed on amiodarone 200 mg twice daily x 3 weeks followed by amiodarone 200 mg once daily   [name] MD

## 2023-04-04 NOTE — Anesthesia Preprocedure Evaluation (Signed)
Anesthesia Evaluation  Patient identified by MRN, date of birth, ID band Patient awake    Reviewed: Allergy & Precautions, H&P , NPO status , Patient's Chart, lab work & pertinent test results, reviewed documented beta blocker date and time   Airway Mallampati: II  TM Distance: >3 FB Neck ROM: full    Dental no notable dental hx.    Pulmonary neg pulmonary ROS, asthma , former smoker   Pulmonary exam normal breath sounds clear to auscultation       Cardiovascular Exercise Tolerance: Good hypertension, + Peripheral Vascular Disease  negative cardio ROS + dysrhythmias Atrial Fibrillation  Rhythm:irregular Rate:Normal     Neuro/Psych   Anxiety      Neuromuscular disease negative neurological ROS  negative psych ROS   GI/Hepatic negative GI ROS, Neg liver ROS, PUD,GERD  ,,  Endo/Other  negative endocrine ROSHypothyroidism    Renal/GU negative Renal ROS  negative genitourinary   Musculoskeletal   Abdominal   Peds  Hematology negative hematology ROS (+) Blood dyscrasia, anemia   Anesthesia Other Findings   Reproductive/Obstetrics negative OB ROS                             Anesthesia Physical Anesthesia Plan  ASA: 3  Anesthesia Plan: General   Post-op Pain Management:    Induction:   PONV Risk Score and Plan: Propofol infusion  Airway Management Planned:   Additional Equipment:   Intra-op Plan:   Post-operative Plan:   Informed Consent: I have reviewed the patients History and Physical, chart, labs and discussed the procedure including the risks, benefits and alternatives for the proposed anesthesia with the patient or authorized representative who has indicated his/her understanding and acceptance.     Dental Advisory Given  Plan Discussed with: CRNA  Anesthesia Plan Comments:        Anesthesia Quick Evaluation

## 2023-04-06 NOTE — Anesthesia Postprocedure Evaluation (Signed)
Anesthesia Post Note  Patient: [name]  Procedure(s) Performed: CARDIOVERSION  Patient location during evaluation: Phase II Anesthesia Type: General Level of consciousness: awake Pain management: pain level controlled Vital Signs Assessment: post-procedure vital signs reviewed and stable Respiratory status: spontaneous breathing and respiratory function stable Cardiovascular status: blood pressure returned to baseline and stable Postop Assessment: no headache and no apparent nausea or vomiting Anesthetic complications: no Comments: Late entry   No notable events documented.   Last Vitals:  Vitals:   04/04/23 0945 04/04/23 1006  BP:  (!) 179/102  Pulse:  69  Resp: 17 16  Temp:  36.6 C  SpO2:  97%    Last Pain:  Vitals:   04/04/23 1006  TempSrc: Oral  PainSc: 0-No pain                 Windell Norfolk

## 2023-04-08 ENCOUNTER — Telehealth: Payer: Self-pay | Admitting: Internal Medicine

## 2023-04-08 NOTE — Telephone Encounter (Signed)
Pt states that on Friday she had DCCV. She states that her IV site was hurting during her procedure. She states that on Sunday she noticed that the IV site was swollen slightly. She denies bruising to the site. She states that it is no longer tender to the touch. Informed pt to apply warm compresses 3-4 times daily for 15 mins., circle area and call if it worsens.

## 2023-04-08 NOTE — Telephone Encounter (Signed)
Patient had a DCCV done last Friday 04/04/23. She has a knot on her hand where her IV was administrated that is concerned about. Only sore if she applies pressure.

## 2023-04-09 ENCOUNTER — Encounter (HOSPITAL_COMMUNITY): Payer: Self-pay | Admitting: Cardiology

## 2023-04-09 NOTE — Progress Notes (Signed)
See other alternate note

## 2023-04-09 NOTE — Progress Notes (Signed)
Pt with iliohypogastic nerve pain ?etiology  See trigger point note

## 2023-04-10 ENCOUNTER — Ambulatory Visit: Payer: Medicare HMO | Admitting: Podiatry

## 2023-04-10 DIAGNOSIS — L84 Corns and callosities: Secondary | ICD-10-CM | POA: Diagnosis not present

## 2023-04-10 DIAGNOSIS — M79671 Pain in right foot: Secondary | ICD-10-CM | POA: Diagnosis not present

## 2023-04-10 DIAGNOSIS — L97911 Non-pressure chronic ulcer of unspecified part of right lower leg limited to breakdown of skin: Secondary | ICD-10-CM | POA: Diagnosis not present

## 2023-04-10 DIAGNOSIS — M79672 Pain in left foot: Secondary | ICD-10-CM | POA: Diagnosis not present

## 2023-04-10 DIAGNOSIS — M79673 Pain in unspecified foot: Secondary | ICD-10-CM | POA: Diagnosis not present

## 2023-04-10 MED ORDER — MUPIROCIN 2 % EX OINT: TOPICAL_OINTMENT | CUTANEOUS | 2 refills | Status: DC

## 2023-04-10 NOTE — Progress Notes (Signed)
Subjective: Chief Complaint  Patient presents with   Callouses    Callus to right hallux    Toe Pain    5th toe on right foot has soreness. Patient believes her shoes are to small and putting pressure to the toe.     82 year old female presents the office today for further evaluation of a painful callus on the right foot.  She states is doing better.  She thinks it is because her shoes are too small.  She been keeping mupirocin ointment on it daily.  Does not see any drainage or pus or swelling or redness.  Objective: AAO x3, NAD DP/PT pulses palpable bilaterally, CRT less than 3 seconds There is mild hyperkeratotic tissue present plantar aspect of right hallux with dried blood.  Upon debridement there is no underlying ulceration drainage or any signs of infection and overall improving. Superficial laceration, abrasion to the right calf without any signs of infection today. Hallux malleolus present  No pain with calf compression, swelling, warmth, erythema  Assessment: Preulcerative callus right foot  Plan: -All treatment options discussed with the patient including all alternatives, risks, complications.  -Debrided the hyperkeratotic tissue with any complications or bleeding that is minimal today.  Continue offloading. -Mupirocin ointment to the area on the.  Monitor for any signs or symptoms of infection.  If not resolved in the next 1 to 2 weeks to let me know or sooner if there is any changes or worsening. -To be measured for accommodate inserts to help offload.  -Monitor for any clinical signs or symptoms of infection and directed to call the office immediately should any occur or go to the ER.  Return for pick up inserts .  Vivi Barrack DPM

## 2023-04-14 ENCOUNTER — Telehealth: Payer: Self-pay | Admitting: Internal Medicine

## 2023-04-14 MED ORDER — FUROSEMIDE 20 MG PO TABS: 20.0000 mg | ORAL_TABLET | Freq: Every day | ORAL | 1 refills | Status: DC

## 2023-04-14 NOTE — Telephone Encounter (Signed)
Pt c/o swelling: STAT is pt has developed SOB within 24 hours  If swelling, where is the swelling located? Legs   How much weight have you gained and in what time span? No   Have you gained 3 pounds in a day or 5 pounds in a week? No   Do you have a log of your daily weights (if so, list)? Did not record   Are you currently taking a fluid pill? No   Are you currently SOB? No   Have you traveled recently?  No   Pt states her legs are swelling and painful. She states she wears compression hose but it is not working. Pt wonders if the amiodarone is causing her swelling and pain. Please advise.

## 2023-04-14 NOTE — Telephone Encounter (Signed)
Dr.Mallipeddi clarification: Patient wonders if AMIODARONE (not amlodipine) can cause her swelling as sx's started after starting it.   She will start lasix 20 mg daily

## 2023-04-15 ENCOUNTER — Other Ambulatory Visit (HOSPITAL_BASED_OUTPATIENT_CLINIC_OR_DEPARTMENT_OTHER): Payer: Self-pay | Admitting: Otolaryngology

## 2023-04-15 ENCOUNTER — Ambulatory Visit (HOSPITAL_BASED_OUTPATIENT_CLINIC_OR_DEPARTMENT_OTHER)
Admission: RE | Admit: 2023-04-15 | Discharge: 2023-04-15 | Disposition: A | Discharge status: Home / Self Care | Payer: Medicare HMO | Source: Ambulatory Visit | Attending: Otolaryngology | Admitting: Otolaryngology

## 2023-04-15 DIAGNOSIS — M25561 Pain in right knee: Secondary | ICD-10-CM | POA: Diagnosis not present

## 2023-04-15 DIAGNOSIS — M25562 Pain in left knee: Secondary | ICD-10-CM | POA: Insufficient documentation

## 2023-04-15 DIAGNOSIS — M79662 Pain in left lower leg: Secondary | ICD-10-CM | POA: Diagnosis not present

## 2023-04-15 DIAGNOSIS — M79661 Pain in right lower leg: Secondary | ICD-10-CM | POA: Diagnosis not present

## 2023-04-15 DIAGNOSIS — R600 Localized edema: Secondary | ICD-10-CM | POA: Diagnosis not present

## 2023-04-15 NOTE — Telephone Encounter (Signed)
Patient's daughter is calling back with results of test today. Requesting return call.

## 2023-04-15 NOTE — Telephone Encounter (Signed)
Patients daughter notified and verbalized understanding 

## 2023-04-15 NOTE — Telephone Encounter (Signed)
Patient had TSH done by [facility] on 03/18/23

## 2023-04-15 NOTE — Telephone Encounter (Signed)
Patient's daughter is calling stating the patient went to [facility] urgent care due to leg swelling increasing with the patient being unable to walk on the leg. The patient is not on the way to have a doppler performed to check for blood clots. They were advised to call the office and make them aware.   Please advise.

## 2023-04-16 NOTE — Telephone Encounter (Signed)
Per imaging results (available in mychart):  IMPRESSION: 1. No evidence of femoropopliteal DVT or superficial thrombophlebitis within either lower extremity. 2. Mild subcutaneous edema at the imaged distal LEFT lower extremity   Roanna Banning, MD   [facility]   [facility]    Please advise

## 2023-04-17 NOTE — Telephone Encounter (Signed)
Mychart message sent to patient.

## 2023-04-18 ENCOUNTER — Other Ambulatory Visit: Payer: Self-pay | Admitting: Podiatry

## 2023-04-18 DIAGNOSIS — M2031 Hallux varus (acquired), right foot: Secondary | ICD-10-CM

## 2023-04-18 DIAGNOSIS — L84 Corns and callosities: Secondary | ICD-10-CM

## 2023-04-21 ENCOUNTER — Telehealth: Payer: Self-pay | Admitting: Internal Medicine

## 2023-04-21 ENCOUNTER — Ambulatory Visit: Payer: Medicare HMO | Attending: Medical | Admitting: Medical

## 2023-04-21 ENCOUNTER — Encounter: Payer: Self-pay | Admitting: Medical

## 2023-04-21 VITALS — BP 132/74 | HR 72 | Ht 64.0 in | Wt 156.0 lb

## 2023-04-21 DIAGNOSIS — I4819 Other persistent atrial fibrillation: Secondary | ICD-10-CM

## 2023-04-21 DIAGNOSIS — I10 Essential (primary) hypertension: Secondary | ICD-10-CM | POA: Diagnosis not present

## 2023-04-21 DIAGNOSIS — I4892 Unspecified atrial flutter: Secondary | ICD-10-CM

## 2023-04-21 DIAGNOSIS — R600 Localized edema: Secondary | ICD-10-CM | POA: Diagnosis not present

## 2023-04-21 NOTE — Progress Notes (Signed)
Cardiology Office Note:    Date:  04/21/2023   ID:  Jametra, Wallington 05-11-1941, MRN [id number]  PCP:  Benita Stabile, MD  [facility] Cardiologist:  None  [facility] Electrophysiologist:  None   Referring MD: Benita Stabile, MD   Chief Complaint: Post-DCCV  History of Present Illness:    DABNEY GITCHELL is a 82 y.o. female with a hx of hypertension, Aflutter s/p DCCV on Eliquis who presents for 69-month follow-up.  Patient takes care of her husband who has advanced Alzheimer's dementia.  Patient was referred to cardiology in January 2024 for shortness of breath and feeling tired.  She felt stressed out from taking care of her husband.  She saw her PCP who noted possible a flutter waves.  Patient was seen 02/20/2023 and EKG showed atrial flutter with a heart rate of 80.  She was started on Eliquis 5 mg twice daily and set up for cardioversion.    Patient underwent cardioversion 04/04/2023 and was started on amiodarone load with daily maintenance post cardioversion.  Today, the patient is in NSR on EKG. She reports swelling since the cardioversion. She was started on lasix 20mg  5/14. She has not seen an improvement in her swelling. 1+ lower leg edema. She denies shortness of breath or chest pain. No dizziness or lightheadedness. No orthopnea or ond. She takes amlodipine 2.5mg  BID.   Past Medical History:  Diagnosis Date   Anemia    Anxiety    Arthritis    Asthma    Atrial flutter (HCC)    Constipation    Dysrhythmia    GERD (gastroesophageal reflux disease)    High blood pressure    History of COVID-19 12/28/2020   Hypothyroidism    PAD (peripheral artery disease) (HCC)    bilateral legs   Peripheral edema    Peripheral neuropathy    bilateral feet at night occ   Psoriasis    Seasonal allergies    Skin cancer    Left eye brow   Varicose veins    Vertigo 11/23/2013    Past Surgical History:  Procedure Laterality Date   APPENDECTOMY     patient unsure    BIOPSY  12/21/2020   Procedure: BIOPSY;  Surgeon: Corbin Ade, MD;  [facility]: [facility];  Service: Endoscopy;;   BIOPSY  05/28/2021   Procedure: BIOPSY;  Surgeon: Lanelle Bal, DO;  Location: [facility];  Service: Endoscopy;;   CARDIOVERSION N/A 04/04/2023   Procedure: CARDIOVERSION;  Surgeon: Antoine Poche, MD;  Location: [facility];  Service: Endoscopy;  Laterality: N/A;   CATARACT EXTRACTION W/PHACO Left 01/27/2017   Procedure: CATARACT EXTRACTION PHACO AND INTRAOCULAR LENS PLACEMENT (IOC);  Surgeon: Gemma Payor, MD;  Location: [facility];  Service: Ophthalmology;  Laterality: Left;  CDE: 29.90   CATARACT EXTRACTION W/PHACO Right 02/24/2017   Procedure: CATARACT EXTRACTION PHACO AND INTRAOCULAR LENS PLACEMENT (IOC);  Surgeon: Gemma Payor, MD;  Location: [facility];  Service: Ophthalmology;  Laterality: Right;  CDE: 8.57   CHOLECYSTECTOMY     COLONOSCOPY N/A 10/01/2016   Procedure: COLONOSCOPY;  Surgeon: Corbin Ade, MD; pancolonic diverticulosis, melanosis coli, otherwise normal exam.  No recommendations to repeat colonoscopy.   COLONOSCOPY WITH PROPOFOL N/A 12/21/2020   Rourk: Diverticulosis   ESOPHAGOGASTRODUODENOSCOPY (EGD) WITH PROPOFOL N/A 12/21/2020   Rourk: Erosive reflux esophagitis, nonbleeding gastric ulcers with biopsy showing erosion/ulceration, negative for H. pylori.   ESOPHAGOGASTRODUODENOSCOPY (EGD) WITH PROPOFOL N/A 05/28/2021   Carver: gastritis (  reactive gastropathy), no H.pylori   FLEXOR TENDON REPAIR Right 11/06/2017   Procedure: RIGHT WRIST FLEXOR TENDON REPAIRAND STT DEBRIEDMENT;  Surgeon: Betha Loa, MD;  Location: [facility];  Service: Orthopedics;  Laterality: Right;   MOUTH SURGERY     artery bleed   TOTAL HIP ARTHROPLASTY Right 01/31/2021   Procedure: TOTAL HIP ARTHROPLASTY ANTERIOR APPROACH;  Surgeon: Samson Frederic, MD;  Location: [facility];  Service: Orthopedics;  Laterality: Right;   TUBAL LIGATION      Current  Medications: Current Meds  Medication Sig   acetaminophen (TYLENOL) 500 MG tablet Take 500-1,000 mg by mouth as needed for mild pain or moderate pain.   albuterol (VENTOLIN HFA) 108 (90 Base) MCG/ACT inhaler Inhale 2 puffs into the lungs every 6 (six) hours as needed for wheezing or shortness of breath.   ALPRAZolam (XANAX) 0.5 MG tablet Take 0.5 mg by mouth in the morning, at noon, and at bedtime. (1000, 1500, AT BEDTIME)   amiodarone (PACERONE) 200 MG tablet Take 1 tablet (200 mg total) by mouth 2 (two) times daily for 21 days, THEN 1 tablet (200 mg total) daily.   apixaban (ELIQUIS) 5 MG TABS tablet Take 1 tablet (5 mg total) by mouth 2 (two) times daily.   azelastine (ASTELIN) 0.1 % nasal spray Place 2 sprays into both nostrils daily. Use 2 sprays in each nostril 1-2 times a day as needed for drainage down your throat/runny nose   budesonide-formoterol (SYMBICORT) 80-4.5 MCG/ACT inhaler Inhale 2 puffs into the lungs 2 (two) times daily. (Patient taking differently: Inhale 2 puffs into the lungs 2 (two) times daily as needed (wheezing/respiratory issues.).)   clotrimazole-betamethasone (LOTRISONE) cream Apply 1 Application topically daily.   diclofenac Sodium (VOLTAREN) 1 % GEL Apply 1 application topically as needed (back pain).   docusate sodium (COLACE) 100 MG capsule Take 100 mg by mouth in the morning.   ELDERBERRY PO Take 50 mg by mouth every evening.   famotidine (PEPCID) 20 MG tablet One after supper   fluticasone (FLONASE) 50 MCG/ACT nasal spray Place 2 sprays into both nostrils in the morning.   furosemide (LASIX) 20 MG tablet Take 1 tablet (20 mg total) by mouth daily.   ipratropium (ATROVENT) 0.06 % nasal spray Place 2 sprays into both nostrils 4 (four) times daily as needed for rhinitis.   levothyroxine (SYNTHROID) 50 MCG tablet Take 1 tablet (50 mcg total) by mouth daily before breakfast.   lidocaine (XYLOCAINE) 2 % solution Use as directed 5 mLs in the mouth or throat as needed  for mouth pain. Gargle and spit 5mL up to three times daily as needed for throat pain.   losartan (COZAAR) 50 MG tablet Take 50 mg by mouth 2 (two) times daily.   Multiple Vitamin (MULTIVITAMIN WITH MINERALS) TABS tablet Take 1 tablet by mouth daily in the afternoon.   mupirocin ointment (BACTROBAN) 2 % APPLY TO AFFECTED AREA DAILY   NONFORMULARY OR COMPOUNDED ITEM Apply 1 Application topically 3 (three) times daily as needed (nerve pain.). Baclofen 2%/Diclofenac Sodium 3%/Gabapentin 5%/Lidocaine HCl 5%/Menthol 1% Topical Gel   pantoprazole (PROTONIX) 40 MG tablet TAKE 1 TABLET BY MOUTH ONCE A DAY.   Polyethyl Glycol-Propyl Glycol (LUBRICANT EYE DROPS) 0.4-0.3 % SOLN Place 1-2 drops into both eyes 3 (three) times daily as needed (dry/irritated eyes.).   polyethylene glycol (MIRALAX / GLYCOLAX) 17 g packet Take 17 g by mouth every evening.   raloxifene (EVISTA) 60 MG tablet Take 1 tablet (60 mg total)  by mouth every morning. (Patient taking differently: Take 60 mg by mouth every other day. IN THE MORNING)   venlafaxine XR (EFFEXOR-XR) 75 MG 24 hr capsule Take 75 mg by mouth daily after breakfast.   vitamin D3 (CHOLECALCIFEROL) 25 MCG tablet Take 1,000 Units by mouth daily.   ZINC CITRATE PO Take 50 mg by mouth every evening.   [DISCONTINUED] amLODipine (NORVASC) 2.5 MG tablet Take 2.5 mg by mouth 2 (two) times daily.     Allergies:   Amoxicillin-pot clavulanate, Ace inhibitors, Guaifenesin, Lisinopril, Cetirizine, Clarithromycin, Entex la, Gabapentin, Levofloxacin, and Meloxicam   Social History   Socioeconomic History   Marital status: Married    Spouse name: Not on file   Number of children: Not on file   Years of education: Not on file   Highest education level: Not on file  Occupational History   Not on file  Tobacco Use   Smoking status: Former    Packs/day: 0.25    Years: 5.00    Additional pack years: 0.00    Total pack years: 1.25    Types: Cigarettes    Quit date: 12/02/1997     Years since quitting: 25.4    Passive exposure: Past   Smokeless tobacco: Never  Vaping Use   Vaping Use: Never used  Substance and Sexual Activity   Alcohol use: Not Currently    Alcohol/week: 1.0 standard drink of alcohol    Types: 1 Glasses of wine per week   Drug use: No   Sexual activity: Not Currently    Birth control/protection: Surgical    Comment: tubal  Other Topics Concern   Not on file  Social History Narrative   Not on file   Social Determinants of Health   Financial Resource Strain: Low Risk  (04/10/2020)   Overall Financial Resource Strain (CARDIA)    Difficulty of Paying Living Expenses: Not hard at all  Food Insecurity: No Food Insecurity (05/31/2021)   Hunger Vital Sign    Worried About Running Out of Food in the Last Year: Never true    Ran Out of Food in the Last Year: Never true  Transportation Needs: No Transportation Needs (02/19/2022)   PRAPARE - Administrator, Civil Service (Medical): No    Lack of Transportation (Non-Medical): No  Physical Activity: Inactive (04/10/2020)   Exercise Vital Sign    Days of Exercise per Week: 0 days    Minutes of Exercise per Session: 0 min  Stress: Stress Concern Present (04/10/2020)   [employer] of Occupational Health - Occupational Stress Questionnaire    Feeling of Stress : To some extent  Social Connections: Socially Integrated (04/10/2020)   Social Connection and Isolation Panel [NHANES]    Frequency of Communication with Friends and Family: More than three times a week    Frequency of Social Gatherings with Friends and Family: More than three times a week    Attends Religious Services: More than 4 times per year    Active Member of [employer] or Organizations: Yes    Attends Engineer, structural: More than 4 times per year    Marital Status: Married     Family History: The patient's family history includes COPD in her brother; Heart attack in her father; Heart disease in her father;  Hypertension in her brother and mother; Varicose Veins in her father.  ROS:   Please see the history of present illness.     All other systems reviewed and are  negative.  EKGs/Labs/Other Studies Reviewed:    The following studies were reviewed today:  Echo 04/2023  1. Left ventricular ejection fraction, by estimation, is 60 to 65%. The  left ventricle has normal function. The left ventricle has no regional  wall motion abnormalities. Left ventricular diastolic parameters are  indeterminate. The average left  ventricular global longitudinal strain is -20.8 %. The global longitudinal  strain is normal.   2. Right ventricular systolic function is normal. The right ventricular  size is mildly enlarged. There is normal pulmonary artery systolic  pressure. The estimated right ventricular systolic pressure is 33.0 mmHg.   3. Left atrial size was mildly dilated.   4. Right atrial size was mildly dilated.   5. The mitral valve is grossly normal. Mild mitral valve regurgitation.   6. Tricuspid valve regurgitation is mild to moderate.   7. The aortic valve is tricuspid. Aortic valve regurgitation is mild. No  aortic stenosis is present. Aortic regurgitation PHT measures 1462 msec.  Aortic valve mean gradient measures 4.0 mmHg.   8. The inferior vena cava is normal in size with greater than 50%  respiratory variability, suggesting right atrial pressure of 3 mmHg.   Comparison(s): Prior images unable to be directly viewed.    EKG:  EKG is ordered today.  The ekg ordered today demonstrates NSR 65bpm, 1st degree AV block, nonspecific T wave changes  Recent Labs: 08/01/2022: BNP 93.9; Platelets 215 12/26/2022: Hemoglobin 11.0 02/20/2023: ALT 18 03/18/2023: TSH 2.300 04/02/2023: BUN 17; Creatinine, Ser 0.81; Potassium 3.6; Sodium 138  Recent Lipid Panel    Component Value Date/Time   TRIG 92 09/11/2020 1547    Physical Exam:    VS:  BP 132/74   Pulse 72   Ht 5\' 4"  (1.626 m)   Wt 156 lb  (70.8 kg)   SpO2 98%   BMI 26.78 kg/m     Wt Readings from Last 3 Encounters:  04/21/23 156 lb (70.8 kg)  04/04/23 154 lb 5.2 oz (70 kg)  04/02/23 152 lb 12.8 oz (69.3 kg)     GEN:  Well nourished, well developed in no acute distress HEENT: Normal NECK: No JVD; No carotid bruits LYMPHATICS: No lymphadenopathy CARDIAC: RRR, no murmurs, rubs, gallops RESPIRATORY:  Clear to auscultation without rales, wheezing or rhonchi  ABDOMEN: Soft, non-tender, non-distended MUSCULOSKELETAL:  1+ pitting ankle edema; No deformity  SKIN: Warm and dry NEUROLOGIC:  Alert and oriented x 3 PSYCHIATRIC:  Normal affect   ASSESSMENT:    1. Atrial flutter, unspecified type (HCC)   2. Persistent atrial fibrillation (HCC)   3. Lower leg edema   4. Essential hypertension    PLAN:    In order of problems listed above:  Persistent Afib/flutter She is s/p DCCV and is in NSR today with 1st degree AV block. Continue amiodarone 200mg  daily. She is on Eliquis 5mg  BID for stroke ppx. I will check a CBC and Mag. Can address OSA evaluation at follow-up.   LLE Patient has 1+ pitting ankle edema. Appears she has a long history of chronic swelling. Prior echo showed normal LVEF with indeterminate diastolic function. She had recent US dopplers of the lower extremities that did not show a DVT. She was started on lasix 20mg  six days ago. I will Increase lasix 20mg  BID x 3 days, then back down to 20mg  daily. Stop amlodipine for now. I will check a BMET, BNP.   HTN Stop amlodipine as above. Continue Losartan 50mg  BID.   Disposition:  Follow up in 3-4  week(s) with MD/APP    Signed, Allsion Nogales David Stall, PA-C  04/21/2023 4:08 PM    [facility]

## 2023-04-21 NOTE — Telephone Encounter (Signed)
Pt c/o swelling: STAT is pt has developed SOB within 24 hours  How much weight have you gained and in what time span? "I don't guess I've gained any in the last 2 3 years"  If swelling, where is the swelling located? "Legs and feet"  Are you currently taking a fluid pill? "Yes"  Are you currently SOB? "No"  Do you have a log of your daily weights (if so, list)? "No"  Have you gained 3 pounds in a day or 5 pounds in a week? "Oh, well I probably have 5 lbs in a week, the left leg is swelling more than the right one"   Have you traveled recently? "No"   Pt stated she sent messages on MyChart and didn't know what to do but she's still having some swelling. Please advise

## 2023-04-21 NOTE — Telephone Encounter (Signed)
MyChart message from 04/18/23 was sent to patient and advised [name] to make clinic appointment.   Patient offered 3:30 pm apt today with PA, Cadence Furth, patient accepts

## 2023-04-21 NOTE — Patient Instructions (Addendum)
Medication Instructions:   STOP Amlodipine   INCREASE Lasix to 40 mg once a day for 3 days, then, go back to 20 mg once a day    Labwork: CBC,BMET,BNP,Magnesium  Testing/Procedures: None today  Follow-Up: 1 month  Any Other Special Instructions Will Be Listed Below (If Applicable).  If you need a refill on your cardiac medications before your next appointment, please call your pharmacy.

## 2023-04-22 ENCOUNTER — Other Ambulatory Visit: Payer: Self-pay

## 2023-04-22 ENCOUNTER — Other Ambulatory Visit (HOSPITAL_COMMUNITY)
Admission: RE | Admit: 2023-04-22 | Discharge: 2023-04-22 | Disposition: A | Discharge status: Home / Self Care | Payer: Medicare HMO | Source: Ambulatory Visit | Attending: Medical | Admitting: Medical

## 2023-04-22 DIAGNOSIS — R600 Localized edema: Secondary | ICD-10-CM

## 2023-04-22 DIAGNOSIS — I4819 Other persistent atrial fibrillation: Secondary | ICD-10-CM | POA: Diagnosis not present

## 2023-04-22 LAB — BASIC METABOLIC PANEL
Anion gap: 8 (ref 5–15)
BUN: 17 mg/dL (ref 8–23)
CO2: 30 mmol/L (ref 22–32)
Calcium: 8.5 mg/dL — ABNORMAL LOW (ref 8.9–10.3)
Chloride: 100 mmol/L (ref 98–111)
Creatinine, Ser: 0.93 mg/dL (ref 0.44–1.00)
GFR, Estimated: >60 mL/min (ref >60)
Glucose, Bld: 112 mg/dL — ABNORMAL HIGH (ref 70–99)
Potassium: 3.3 mmol/L — ABNORMAL LOW (ref 3.5–5.1)
Sodium: 138 mmol/L (ref 135–145)

## 2023-04-22 LAB — CBC
HCT: 35.5 % — ABNORMAL LOW (ref 36.0–46.0)
Hemoglobin: 11.4 g/dL — ABNORMAL LOW (ref 12.0–15.0)
MCH: 30.5 pg (ref 26.0–34.0)
MCHC: 32.1 g/dL (ref 30.0–36.0)
MCV: 94.9 fL (ref 80.0–100.0)
Platelets: 176 10*3/uL (ref 150–400)
RBC: 3.74 MIL/uL — ABNORMAL LOW (ref 3.87–5.11)
RDW: 15.5 % (ref 11.5–15.5)
WBC: 7.7 10*3/uL (ref 4.0–10.5)
nRBC: 0 % (ref 0.0–0.2)

## 2023-04-22 LAB — MAGNESIUM: Magnesium: 2 mg/dL (ref 1.7–2.4)

## 2023-04-22 LAB — BRAIN NATRIURETIC PEPTIDE: B Natriuretic Peptide: 186 pg/mL — ABNORMAL HIGH (ref 0.0–100.0)

## 2023-04-22 MED ORDER — POTASSIUM CHLORIDE CRYS ER 10 MEQ PO TBCR: 10.0000 meq | EXTENDED_RELEASE_TABLET | Freq: Every day | ORAL | 3 refills | Status: DC

## 2023-04-23 DIAGNOSIS — L282 Other prurigo: Secondary | ICD-10-CM | POA: Diagnosis not present

## 2023-04-23 DIAGNOSIS — I4892 Unspecified atrial flutter: Secondary | ICD-10-CM | POA: Diagnosis not present

## 2023-04-23 DIAGNOSIS — M79604 Pain in right leg: Secondary | ICD-10-CM | POA: Diagnosis not present

## 2023-04-23 DIAGNOSIS — R600 Localized edema: Secondary | ICD-10-CM | POA: Diagnosis not present

## 2023-04-23 DIAGNOSIS — G629 Polyneuropathy, unspecified: Secondary | ICD-10-CM | POA: Diagnosis not present

## 2023-04-24 ENCOUNTER — Telehealth: Payer: Self-pay | Admitting: Internal Medicine

## 2023-04-24 NOTE — Telephone Encounter (Signed)
Pt c/o medication issue:  1. Name of Medication: apixaban (ELIQUIS) 5 MG TABS tablet   2. How are you currently taking this medication (dosage and times per day)? Two tablets daily for 3 days  3. Are you having a reaction (difficulty breathing--STAT)? No   4. What is your medication issue? Swelling has improved with leg elevation. Wanting to know if she can go back to taking 1 tablet daily. Requesting a detailed message be left if she does not answer callback.  Patient's ipad is broken so she is not able to use mychart at this time.

## 2023-04-25 NOTE — Telephone Encounter (Signed)
LLE Patient has 1+ pitting ankle edema. Appears she has a long history of chronic swelling. Prior echo showed normal LVEF with indeterminate diastolic function. She had recent US dopplers of the lower extremities that did not show a DVT. She was started on lasix 20mg  six days ago. I will Increase lasix 20mg  BID x 3 days, then back down to 20mg  daily. Stop amlodipine for now. I will check a BMET, BNP.

## 2023-04-25 NOTE — Telephone Encounter (Signed)
Patient verified the correct medication is Lasix. Patient notified that she should start Lasix 20 mg tablets po QD. Pt verbalized understanding. Pt had no further questions or concerns at this time.

## 2023-04-29 ENCOUNTER — Ambulatory Visit
Admission: EM | Admit: 2023-04-29 | Discharge: 2023-04-29 | Disposition: A | Discharge status: Home / Self Care | Payer: Medicare HMO | Attending: Nurse Practitioner | Admitting: Nurse Practitioner

## 2023-04-29 DIAGNOSIS — R1030 Lower abdominal pain, unspecified: Secondary | ICD-10-CM

## 2023-04-29 LAB — POCT URINALYSIS DIP (MANUAL ENTRY)
Bilirubin, UA: NEGATIVE
Blood, UA: NEGATIVE
Glucose, UA: NEGATIVE mg/dL
Ketones, POC UA: NEGATIVE mg/dL
Nitrite, UA: NEGATIVE
Protein Ur, POC: NEGATIVE mg/dL
Spec Grav, UA: 1.015 (ref 1.010–1.025)
Urobilinogen, UA: 0.2 E.U./dL
pH, UA: 7 (ref 5.0–8.0)

## 2023-04-29 NOTE — ED Provider Notes (Signed)
[facility]    CSN: [id number] Arrival date & time: 04/29/23  1136      History   Chief Complaint No chief complaint on file.   HPI [name] is a 82 y.o. female.   Patient presents today with 1 day history of sharp lower abdominal pain that began this morning and woke her out of sleep.  Reports the pain onset was sudden and severity was severe.  The pain is now a 2-3 out of 10.  She describes the pain as a sharp pain.  The pain does not radiate, however did transfer to both sides of her abdomen.  The patient denies fever, body aches or chills, nausea/vomiting, diarrhea, burning with urination, increased urinary frequency or urgency, bloody stool, bloody urine, and heartburn/indigestion since the pain began.  Reports she has felt "foolish" since the pain began.  Reports she stopped taking MiraLAX a few days ago because she thought the constipation was all better.  She took a dose of MiraLAX today and did have a small bowel movement which did seem to help with the pain significantly.    Past Medical History:  Diagnosis Date   Anemia    Anxiety    Arthritis    Asthma    Atrial flutter (HCC)    Constipation    Dysrhythmia    GERD (gastroesophageal reflux disease)    High blood pressure    History of COVID-19 12/28/2020   Hypothyroidism    PAD (peripheral artery disease) (HCC)    bilateral legs   Peripheral edema    Peripheral neuropathy    bilateral feet at night occ   Psoriasis    Seasonal allergies    Skin cancer    Left eye brow   Varicose veins    Vertigo 11/23/2013    Patient Active Problem List   Diagnosis Date Noted   Unspecified atrial flutter (HCC) 02/20/2023   Seasonal and perennial allergic rhinitis 10/16/2022   Gastritis and gastroduodenitis 06/14/2021   Gastric ulcer 04/13/2021   Abdominal pain 03/15/2021   Fracture of femoral neck, right (HCC) 01/31/2021   Closed displaced fracture of right femoral neck (HCC) 01/31/2021    Indigestion 11/02/2020   Change in stool caliber 11/02/2020   Loss of weight 08/23/2020   Nausea without vomiting 06/15/2020   Tick bite 04/10/2020   Medication refill 04/10/2020   Acquired trigger finger 03/28/2020   Pain in right knee 10/08/2019   Acute maxillary sinusitis 08/20/2019   Cellulitis, leg 08/10/2019   Cellulitis 08/08/2019   Fever 08/08/2019   Cellulitis of foot 08/08/2019   Hypokalemia 08/08/2019   Hyponatremia 08/08/2019   Essential hypertension 08/08/2019   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   Adverse food reaction 09/24/2018   Mild persistent asthma without complication 09/24/2018   Pain of left hip joint 07/17/2018   Dyspnea on exertion 01/24/2017   Cough variant asthma  vs UACS 01/24/2017   High risk medication use 09/25/2016   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   Varicose veins 02/15/2014   Vertigo 11/23/2013   Pain in limb 10/04/2013   Hip bursitis 02/11/2013   Constipation 02/16/2009   RECTAL BLEEDING 02/16/2009   HIGH BLOOD PRESSURE 01/29/2008    Past Surgical History:  Procedure Laterality Date   APPENDECTOMY     patient unsure   BIOPSY  12/21/2020   Procedure: BIOPSY;  Surgeon: Corbin Ade, MD;  Location: [facility];  Service: Endoscopy;;   BIOPSY  05/28/2021   Procedure: BIOPSY;  Surgeon: Lanelle Bal, DO;  Location: [facility];  Service: Endoscopy;;   CARDIOVERSION N/A 04/04/2023   Procedure: CARDIOVERSION;  Surgeon: Antoine Poche, MD;  Location: [facility];  Service: Endoscopy;  Laterality: N/A;   CATARACT EXTRACTION W/PHACO Left 01/27/2017   Procedure: CATARACT EXTRACTION PHACO AND INTRAOCULAR LENS PLACEMENT (IOC);  Surgeon: Gemma Payor, MD;  Location: [facility];  Service: Ophthalmology;  Laterality: Left;  CDE: 29.90   CATARACT EXTRACTION W/PHACO Right 02/24/2017   Procedure: CATARACT EXTRACTION PHACO AND INTRAOCULAR LENS PLACEMENT (IOC);  Surgeon: Gemma Payor, MD;  Location: [facility];   Service: Ophthalmology;  Laterality: Right;  CDE: 8.57   CHOLECYSTECTOMY     COLONOSCOPY N/A 10/01/2016   Procedure: COLONOSCOPY;  Surgeon: Corbin Ade, MD; pancolonic diverticulosis, melanosis coli, otherwise normal exam.  No recommendations to repeat colonoscopy.   COLONOSCOPY WITH PROPOFOL N/A 12/21/2020   Rourk: Diverticulosis   ESOPHAGOGASTRODUODENOSCOPY (EGD) WITH PROPOFOL N/A 12/21/2020   Rourk: Erosive reflux esophagitis, nonbleeding gastric ulcers with biopsy showing erosion/ulceration, negative for H. pylori.   ESOPHAGOGASTRODUODENOSCOPY (EGD) WITH PROPOFOL N/A 05/28/2021   Carver: gastritis (reactive gastropathy), no H.pylori   FLEXOR TENDON REPAIR Right 11/06/2017   Procedure: RIGHT WRIST FLEXOR TENDON REPAIRAND STT DEBRIEDMENT;  Surgeon: Betha Loa, MD;  Location:  [facility];  Service: Orthopedics;  Laterality: Right;   MOUTH SURGERY     artery bleed   TOTAL HIP ARTHROPLASTY Right 01/31/2021   Procedure: TOTAL HIP ARTHROPLASTY ANTERIOR APPROACH;  Surgeon: Samson Frederic, MD;  Location: [facility];  Service: Orthopedics;  Laterality: Right;   TUBAL LIGATION      OB History     Gravida  2   Para  2   Term      Preterm      AB      Living  2      SAB      IAB      Ectopic      Multiple      Live Births  2            Home Medications    Prior to Admission medications   Medication Sig Start Date End Date Taking? Authorizing Provider  acetaminophen (TYLENOL) 500 MG tablet Take 500-1,000 mg by mouth as needed for mild pain or moderate pain.    [provider]  albuterol (VENTOLIN HFA) 108 (90 Base) MCG/ACT inhaler Inhale 2 puffs into the lungs every 6 (six) hours as needed for wheezing or shortness of breath. 11/11/22   Birder Robson, MD  ALPRAZolam Prudy Feeler) 0.5 MG tablet Take 0.5 mg by mouth in the morning, at noon, and at bedtime. (1000, 1500, AT BEDTIME)    [provider]  amiodarone (PACERONE) 200 MG tablet Take 1  tablet (200 mg total) by mouth 2 (two) times daily for 21 days, THEN 1 tablet (200 mg total) daily. 04/04/23 07/24/23  [place] Ouch, Lennart Pall, PA-C  apixaban (ELIQUIS) 5 MG TABS tablet Take 1 tablet (5 mg total) by mouth 2 (two) times daily. 02/20/23   Mallipeddi, Vishnu P, MD  azelastine (ASTELIN) 0.1 % nasal spray Place 2 sprays into both nostrils daily. Use 2 sprays in each nostril 1-2 times a day as needed for drainage down your throat/runny nose 03/28/23   Alfonse Spruce, MD  budesonide-formoterol [facility]) 80-4.5 MCG/ACT inhaler Inhale 2 puffs into the lungs 2 (two) times daily. Patient taking differently: Inhale 2 puffs into the lungs  2 (two) times daily as needed (wheezing/respiratory issues.). 11/11/22   Birder Robson, MD  clotrimazole-betamethasone (LOTRISONE) cream Apply 1 Application topically daily. 03/04/23   Vivi Barrack, DPM  diclofenac Sodium (VOLTAREN) 1 % GEL Apply 1 application topically as needed (back pain).    [provider]  docusate sodium (COLACE) 100 MG capsule Take 100 mg by mouth in the morning.    [provider]  ELDERBERRY PO Take 50 mg by mouth every evening.    [provider]  famotidine (PEPCID) 20 MG tablet One after supper 08/01/22   Nyoka Cowden, MD  fluticasone [facility]) 50 MCG/ACT nasal spray Place 2 sprays into both nostrils in the morning. 03/28/23   Alfonse Spruce, MD  furosemide (LASIX) 20 MG tablet Take 1 tablet (20 mg total) by mouth daily. 04/14/23 07/13/23  Mallipeddi, Vishnu P, MD  ipratropium (ATROVENT) 0.06 % nasal spray Place 2 sprays into both nostrils 4 (four) times daily as needed for rhinitis. 11/11/22   Birder Robson, MD  levothyroxine (SYNTHROID) 50 MCG tablet Take 1 tablet (50 mcg total) by mouth daily before breakfast. 03/26/23   Dani Gobble, NP  lidocaine (XYLOCAINE) 2 % solution Use as directed 5 mLs in the mouth or throat as needed for mouth pain. Gargle and spit 5mL up to three times daily as  needed for throat pain. 09/28/22   Leath-Warren, Sadie Haber, NP  losartan (COZAAR) 50 MG tablet Take 50 mg by mouth 2 (two) times daily. 01/07/23   [provider]  Multiple Vitamin (MULTIVITAMIN WITH MINERALS) TABS tablet Take 1 tablet by mouth daily in the afternoon.    [provider]  mupirocin ointment (BACTROBAN) 2 % APPLY TO AFFECTED AREA DAILY 04/10/23   Vivi Barrack, DPM  NONFORMULARY OR COMPOUNDED ITEM Apply 1 Application topically 3 (three) times daily as needed (nerve pain.). Baclofen 2%/Diclofenac Sodium 3%/Gabapentin 5%/Lidocaine HCl 5%/Menthol 1% Topical Gel    [provider]  pantoprazole (PROTONIX) 40 MG tablet TAKE 1 TABLET BY MOUTH ONCE A DAY. 06/19/22   Tiffany Kocher, PA-C  Polyethyl Glycol-Propyl Glycol (LUBRICANT EYE DROPS) 0.4-0.3 % SOLN Place 1-2 drops into both eyes 3 (three) times daily as needed (dry/irritated eyes.).    [provider]  polyethylene glycol (MIRALAX / GLYCOLAX) 17 g packet Take 17 g by mouth every evening.    [provider]  potassium chloride SA (KLOR-CON M) 10 MEQ tablet Take 1 tablet (10 mEq total) by mouth daily. 04/22/23 07/21/23  Furth, Cadence H, PA-C  raloxifene (EVISTA) 60 MG tablet Take 1 tablet (60 mg total) by mouth every morning. Patient taking differently: Take 60 mg by mouth every other day. IN THE MORNING 04/10/20   Adline Potter, NP  venlafaxine XR (EFFEXOR-XR) 75 MG 24 hr capsule Take 75 mg by mouth daily after breakfast.    [provider]  vitamin D3 (CHOLECALCIFEROL) 25 MCG tablet Take 1,000 Units by mouth daily.    [provider]  ZINC CITRATE PO Take 50 mg by mouth every evening.    [provider]    Family History Family History  Problem Relation Age of Onset   Hypertension Mother    Varicose Veins Father    Heart attack Father    Heart disease Father    Hypertension Brother    COPD Brother     Social History Social History   Tobacco Use    Smoking status: Former  Packs/day: 0.25    Years: 5.00    Additional pack years: 0.00    Total pack years: 1.25    Types: Cigarettes    Quit date: 12/02/1997    Years since quitting: 25.4    Passive exposure: Past   Smokeless tobacco: Never  Vaping Use   Vaping Use: Never used  Substance Use Topics   Alcohol use: Not Currently    Alcohol/week: 1.0 standard drink of alcohol    Types: 1 Glasses of wine per week   Drug use: No     Allergies   Amoxicillin-pot clavulanate, Ace inhibitors, Guaifenesin, Lisinopril, Cetirizine, Clarithromycin, Entex la, Gabapentin, Levofloxacin, and Meloxicam   Review of Systems Review of Systems Per HPI  Physical Exam Triage Vital Signs ED Triage Vitals  Enc Vitals Group     BP 04/29/23 1337 (!) 175/103     Pulse Rate 04/29/23 1337 65     Resp 04/29/23 1337 18     Temp 04/29/23 1337 97.6 F (36.4 C)     Temp Source 04/29/23 1337 Oral     SpO2 04/29/23 1337 96 %     Weight --      Height --      Head Circumference --      Peak Flow --      Pain Score 04/29/23 1336 5     Pain Loc --      Pain Edu? --      Excl. in GC? --    No data found.  Updated Vital Signs BP (!) 158/92 (BP Location: Right Arm)   Pulse 65   Temp 97.6 F (36.4 C) (Oral)   Resp 18   SpO2 96%   Visual Acuity Right Eye Distance:   Left Eye Distance:   Bilateral Distance:    Right Eye Near:   Left Eye Near:    Bilateral Near:     Physical Exam Vitals and nursing note reviewed.  Constitutional:      General: She is not in acute distress.    Appearance: Normal appearance. She is not toxic-appearing.  HENT:     Head: Normocephalic and atraumatic.     Mouth/Throat:     Mouth: Mucous membranes are moist.     Pharynx: Oropharynx is clear.  Cardiovascular:     Rate and Rhythm: Normal rate and regular rhythm.  Pulmonary:     Effort: Pulmonary effort is normal. No respiratory distress.     Breath sounds: Normal breath sounds. No wheezing, rhonchi or  rales.  Abdominal:     General: Abdomen is flat. Bowel sounds are normal. There is no distension.     Palpations: Abdomen is soft.     Tenderness: There is no abdominal tenderness. There is no guarding.  Musculoskeletal:     Cervical back: Normal range of motion.  Skin:    General: Skin is warm and dry.     Capillary Refill: Capillary refill takes less than 2 seconds.     Coloration: Skin is not jaundiced or pale.     Findings: No erythema.  Neurological:     Mental Status: She is alert and oriented to person, place, and time.  Psychiatric:        Behavior: Behavior is cooperative.      UC Treatments / Results  Labs (all labs ordered are listed, but only abnormal results are displayed) Labs Reviewed  POCT URINALYSIS DIP (MANUAL ENTRY) - Abnormal; Notable for the following components:  Result Value   Leukocytes, UA Trace (*)    All other components within normal limits    EKG   Radiology No results found.  Procedures Procedures (including critical care time)  Medications Ordered in UC Medications - No data to display  Initial Impression / Assessment and Plan / UC Course  I have reviewed the triage vital signs and the nursing notes.  Pertinent labs & imaging results that were available during my care of the patient were reviewed by me and considered in my medical decision making (see chart for details).   Patient is well-appearing, afebrile, not tachycardic, not tachypneic, oxygenating well on room air.  Patient is mildly hypertensive in urgent care today.  Blood pressure improved with recheck today.  1. Lower abdominal pain Urinalysis today shows trace leukocyte Estrace; low suspicion for UTI given no urinary symptoms Suspect constipation as cause of pain Recommended continuing MiraLAX Strict ER and return precautions discussed with patient  The patient was given the opportunity to ask questions.  All questions answered to their satisfaction.  The patient is  in agreement to this plan.    Final Clinical Impressions(s) / UC Diagnoses   Final diagnoses:  Lower abdominal pain     Discharge Instructions      I suspect the abdominal pain is coming from constipation.  Take the Miralax daily to help with it.  The blood pressure improved today when we rechecked it.  Make sure you are drinking plenty of fluids and eating a high fiber diet.  Seek care in ER if symptoms persist or worsen despite treatment.     ED Prescriptions   None    PDMP not reviewed this encounter.   Valentino Nose, NP 04/29/23 1537

## 2023-04-29 NOTE — Discharge Instructions (Signed)
I suspect the abdominal pain is coming from constipation.  Take the Miralax daily to help with it.  The blood pressure improved today when we rechecked it.  Make sure you are drinking plenty of fluids and eating a high fiber diet.  Seek care in ER if symptoms persist or worsen despite treatment.

## 2023-04-29 NOTE — ED Triage Notes (Signed)
Pt reports she has severe low abdominal pain that went around to her sides x 1 day. Pt states she does have diverticulosis.

## 2023-04-29 NOTE — ED Notes (Signed)
Pt first BP reading was 175/103. Pt was asked did she take her BP meds today and she said yes. Rechecked pt BP manually and it was 170/100. Pt states she has a headache when asked but no dizziness or blurred vision.

## 2023-05-07 ENCOUNTER — Telehealth: Payer: Self-pay | Admitting: Internal Medicine

## 2023-05-07 MED ORDER — FUROSEMIDE 20 MG PO TABS: 20.0000 mg | ORAL_TABLET | Freq: Every day | ORAL | 1 refills | Status: DC

## 2023-05-07 NOTE — Telephone Encounter (Signed)
*  STAT* If patient is at the pharmacy, call can be transferred to refill team.   1. Which medications need to be refilled? (please list name of each medication and dose if known)   furosemide (LASIX) 20 MG tablet    2. Which pharmacy/location (including street and city if local pharmacy) is medication to be sent to? [facility] - [place], [place] - [address]    3. Do they need a 30 day or 90 day supply? 90 day   Pt is out of medication due to having to increase per provider

## 2023-05-07 NOTE — Telephone Encounter (Signed)
Completed.

## 2023-05-09 ENCOUNTER — Ambulatory Visit: Payer: Medicare HMO | Admitting: Student

## 2023-05-19 ENCOUNTER — Ambulatory Visit: Payer: Medicare HMO | Attending: Internal Medicine | Admitting: Internal Medicine

## 2023-05-19 ENCOUNTER — Encounter: Payer: Self-pay | Admitting: Internal Medicine

## 2023-05-19 ENCOUNTER — Telehealth: Payer: Self-pay | Admitting: Internal Medicine

## 2023-05-19 VITALS — BP 128/76 | HR 94 | Ht 64.0 in | Wt 154.6 lb

## 2023-05-19 DIAGNOSIS — R079 Chest pain, unspecified: Secondary | ICD-10-CM

## 2023-05-19 DIAGNOSIS — I351 Nonrheumatic aortic (valve) insufficiency: Secondary | ICD-10-CM | POA: Diagnosis not present

## 2023-05-19 DIAGNOSIS — R0609 Other forms of dyspnea: Secondary | ICD-10-CM

## 2023-05-19 DIAGNOSIS — I4892 Unspecified atrial flutter: Secondary | ICD-10-CM

## 2023-05-19 MED ORDER — FUROSEMIDE 20 MG PO TABS: 20.0000 mg | ORAL_TABLET | Freq: Every day | ORAL | 1 refills | Status: DC

## 2023-05-19 NOTE — Telephone Encounter (Signed)
Checking percert on the following patient for testing scheduled at [facility].    LEXISC[facility]    05/26/2023

## 2023-05-19 NOTE — Progress Notes (Addendum)
Cardiology Office Note  Date: 05/19/2023   ID: Sylvia, French Oct 18, 1941, MRN [id number]  PCP:  Benita Stabile, MD  Cardiologist:  Marjo Bicker, MD Electrophysiologist:  None   Reason for Office Visit: Follow-up of DCCV for atrial flutter   History of Present Illness: BRYNNE CADWELL is a 82 y.o. female known to have paroxysmal atrial flutter, HTN is here for follow-up visit.  Patient takes care of her husband that has advanced Alzheimer's dementia.  He was diagnosed with Alzheimer's dementia in 2016 and has been worsening. As of now, her daughters and caretakers help take care of her husband. Patient says she was stressed out about taking care of her husband.  Patient has been symptomatic with SOB since 12/2022 and EKG showed atrial flutter for which he underwent DCCV in 01/2023. She was started on amiodarone and has no side effects.  TSH was within normal limits.  She is here for follow-up visit.  After DCCV in 3/24, her SOB symptoms completely resolved until recently she started to experience SOB again.  Last night, she developed severe chest pain across her precordium when she was trying to move her husband.  This lasted for a good while.  Denies syncope.  She has occasional palpitations in the night.  Has some leg swelling.  Past Medical History:  Diagnosis Date   Anemia    Anxiety    Arthritis    Asthma    Atrial flutter (HCC)    Constipation    Dysrhythmia    GERD (gastroesophageal reflux disease)    High blood pressure    History of COVID-19 12/28/2020   Hypothyroidism    PAD (peripheral artery disease) (HCC)    bilateral legs   Peripheral edema    Peripheral neuropathy    bilateral feet at night occ   Psoriasis    Seasonal allergies    Skin cancer    Left eye brow   Varicose veins    Vertigo 11/23/2013    Past Surgical History:  Procedure Laterality Date   APPENDECTOMY     patient unsure   BIOPSY  12/21/2020   Procedure: BIOPSY;  Surgeon:  Corbin Ade, MD;  [facility];  Service: Endoscopy;;   BIOPSY  05/28/2021   Procedure: BIOPSY;  Surgeon: Lanelle Bal, DO;  [facility]: [facility];  Service: Endoscopy;;   CARDIOVERSION N/A 04/04/2023   Procedure: CARDIOVERSION;  Surgeon: Antoine Poche, MD;  Location: [facility];  Service: Endoscopy;  Laterality: N/A;   CATARACT EXTRACTION W/PHACO Left 01/27/2017   Procedure: CATARACT EXTRACTION PHACO AND INTRAOCULAR LENS PLACEMENT (IOC);  Surgeon: Gemma Payor, MD;  Location: [facility];  Service: Ophthalmology;  Laterality: Left;  CDE: 29.90   CATARACT EXTRACTION W/PHACO Right 02/24/2017   Procedure: CATARACT EXTRACTION PHACO AND INTRAOCULAR LENS PLACEMENT (IOC);  Surgeon: Gemma Payor, MD;  Location: [facility];  Service: Ophthalmology;  Laterality: Right;  CDE: 8.57   CHOLECYSTECTOMY     COLONOSCOPY N/A 10/01/2016   Procedure: COLONOSCOPY;  Surgeon: Corbin Ade, MD; pancolonic diverticulosis, melanosis coli, otherwise normal exam.  No recommendations to repeat colonoscopy.   COLONOSCOPY WITH PROPOFOL N/A 12/21/2020   Rourk: Diverticulosis   ESOPHAGOGASTRODUODENOSCOPY (EGD) WITH PROPOFOL N/A 12/21/2020   Rourk: Erosive reflux esophagitis, nonbleeding gastric ulcers with biopsy showing erosion/ulceration, negative for H. pylori.   ESOPHAGOGASTRODUODENOSCOPY (EGD) WITH PROPOFOL N/A 05/28/2021   Carver: gastritis (reactive gastropathy), no H.pylori   FLEXOR TENDON REPAIR Right 11/06/2017  Procedure: RIGHT WRIST FLEXOR TENDON REPAIRAND STT DEBRIEDMENT;  Surgeon: Betha Loa, MD;  Location:  [facility];  Service: Orthopedics;  Laterality: Right;   MOUTH SURGERY     artery bleed   TOTAL HIP ARTHROPLASTY Right 01/31/2021   Procedure: TOTAL HIP ARTHROPLASTY ANTERIOR APPROACH;  Surgeon: Samson Frederic, MD;  Location: [facility];  Service: Orthopedics;  Laterality: Right;   TUBAL LIGATION      Current Outpatient Medications  Medication Sig Dispense Refill    acetaminophen (TYLENOL) 500 MG tablet Take 500-1,000 mg by mouth as needed for mild pain or moderate pain.     albuterol (VENTOLIN HFA) 108 (90 Base) MCG/ACT inhaler Inhale 2 puffs into the lungs every 6 (six) hours as needed for wheezing or shortness of breath. 8 g 1   ALPRAZolam (XANAX) 0.5 MG tablet Take 0.5 mg by mouth in the morning, at noon, and at bedtime. (1000, 1500, AT BEDTIME)     amiodarone (PACERONE) 200 MG tablet Take 1 tablet (200 mg total) by mouth 2 (two) times daily for 21 days, THEN 1 tablet (200 mg total) daily. 132 tablet 0   apixaban (ELIQUIS) 5 MG TABS tablet Take 1 tablet (5 mg total) by mouth 2 (two) times daily. 60 tablet 11   azelastine (ASTELIN) 0.1 % nasal spray Place 2 sprays into both nostrils daily. Use 2 sprays in each nostril 1-2 times a day as needed for drainage down your throat/runny nose 30 mL 5   budesonide-formoterol (SYMBICORT) 80-4.5 MCG/ACT inhaler Inhale 2 puffs into the lungs 2 (two) times daily. (Patient taking differently: Inhale 2 puffs into the lungs 2 (two) times daily as needed (wheezing/respiratory issues.).) 10.2 g 5   clotrimazole-betamethasone (LOTRISONE) cream Apply 1 Application topically daily. 30 g 0   diclofenac Sodium (VOLTAREN) 1 % GEL Apply 1 application topically as needed (back pain).     docusate sodium (COLACE) 100 MG capsule Take 100 mg by mouth in the morning.     ELDERBERRY PO Take 50 mg by mouth every evening.     famotidine (PEPCID) 20 MG tablet One after supper 30 tablet 11   fluticasone (FLONASE) 50 MCG/ACT nasal spray Place 2 sprays into both nostrils in the morning. 16 g 5   furosemide (LASIX) 20 MG tablet Take 1 tablet (20 mg total) by mouth daily. 30 tablet 1   ipratropium (ATROVENT) 0.06 % nasal spray Place 2 sprays into both nostrils 4 (four) times daily as needed for rhinitis. 15 mL 5   levothyroxine (SYNTHROID) 50 MCG tablet Take 1 tablet (50 mcg total) by mouth daily before breakfast. 90 tablet 3   lidocaine  (XYLOCAINE) 2 % solution Use as directed 5 mLs in the mouth or throat as needed for mouth pain. Gargle and spit 5mL up to three times daily as needed for throat pain. 100 mL 0   losartan (COZAAR) 50 MG tablet Take 50 mg by mouth 2 (two) times daily.     Multiple Vitamin (MULTIVITAMIN WITH MINERALS) TABS tablet Take 1 tablet by mouth daily in the afternoon.     mupirocin ointment (BACTROBAN) 2 % APPLY TO AFFECTED AREA DAILY 22 g 2   NONFORMULARY OR COMPOUNDED ITEM Apply 1 Application topically 3 (three) times daily as needed (nerve pain.). Baclofen 2%/Diclofenac Sodium 3%/Gabapentin 5%/Lidocaine HCl 5%/Menthol 1% Topical Gel     pantoprazole (PROTONIX) 40 MG tablet TAKE 1 TABLET BY MOUTH ONCE A DAY. 30 tablet 11   Polyethyl Glycol-Propyl Glycol (LUBRICANT EYE DROPS) 0.4-0.3 %  SOLN Place 1-2 drops into both eyes 3 (three) times daily as needed (dry/irritated eyes.).     polyethylene glycol (MIRALAX / GLYCOLAX) 17 g packet Take 17 g by mouth every evening.     potassium chloride SA (KLOR-CON M) 10 MEQ tablet Take 1 tablet (10 mEq total) by mouth daily. 90 tablet 3   raloxifene (EVISTA) 60 MG tablet Take 1 tablet (60 mg total) by mouth every morning. (Patient taking differently: Take 60 mg by mouth every other day. IN THE MORNING) 30 tablet 12   venlafaxine XR (EFFEXOR-XR) 75 MG 24 hr capsule Take 75 mg by mouth daily after breakfast.     vitamin D3 (CHOLECALCIFEROL) 25 MCG tablet Take 1,000 Units by mouth daily.     ZINC CITRATE PO Take 50 mg by mouth every evening.     No current facility-administered medications for this visit.   Allergies:  Amoxicillin-pot clavulanate, Ace inhibitors, Guaifenesin, Lisinopril, Cetirizine, Clarithromycin, Entex la, Gabapentin, Levofloxacin, and Meloxicam   Social History: The patient  reports that she quit smoking about 25 years ago. Her smoking use included cigarettes. She has a 1.25 pack-year smoking history. She has been exposed to tobacco smoke. She has never  used smokeless tobacco. She reports that she does not currently use alcohol after a past usage of about 1.0 standard drink of alcohol per week. She reports that she does not use drugs.   Family History: The patient's family history includes COPD in her brother; Heart attack in her father; Heart disease in her father; Hypertension in her brother and mother; Varicose Veins in her father.   ROS:  Please see the history of present illness. Otherwise, complete review of systems is positive for none.  All other systems are reviewed and negative.   Physical Exam: VS:  There were no vitals taken for this visit., BMI There is no height or weight on file to calculate BMI.  Wt Readings from Last 3 Encounters:  04/21/23 156 lb (70.8 kg)  04/04/23 154 lb 5.2 oz (70 kg)  04/02/23 152 lb 12.8 oz (69.3 kg)    General: Patient appears comfortable at rest. HEENT: Conjunctiva and lids normal, oropharynx clear with moist mucosa. Neck: Supple, no elevated JVP or carotid bruits, no thyromegaly. Lungs: Clear to auscultation, nonlabored breathing at rest. Cardiac: Regular rate and rhythm, no S3 or significant systolic murmur, no pericardial rub. Abdomen: Soft, nontender, no hepatomegaly, bowel sounds present, no guarding or rebound. Extremities: 1+ pitting edema around the ankle Skin: Warm and dry. Musculoskeletal: No kyphosis. Neuropsychiatric: Alert and oriented x3, affect grossly appropriate.  Recent Labwork: 02/20/2023: ALT 18; AST 24 03/18/2023: TSH 2.300 04/22/2023: B Natriuretic Peptide 186.0; BUN 17; Creatinine, Ser 0.93; Hemoglobin 11.4; Magnesium 2.0; Platelets 176; Potassium 3.3; Sodium 138     Component Value Date/Time   TRIG 92 09/11/2020 1547    Other Studies Reviewed Today: I personally reviewed the heart care referral records from the PCP  Assessment and Plan: Patient is a 82 year old F known to have paroxysmal atrial flutter, HTN is here for follow-up visit  # Paroxysmal atrial flutter  s/p DCCV in 03/24, CV score 3 -EKG today showed NSR, rate controlled. Currently not on any rate controlling agents.  Continue amiodarone 200 mg once daily and the last dose would be in 07/2023. TSH within normal limits.  Continue Eliquis 5 mg twice daily. -Outpatient OSA evaluation  # DOE and chest pain -Patient has DOE for the last few weeks associated with bilateral lower EXTR  swelling around the ankle.  She is currently on p.o. Lasix.  She also had one-time episode of chest pain last night that lasted a good while.  Will obtain Lexiscan and BNP.  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  # HTN, controlled -Amlodipine was discontinued due to bilateral leg swelling and Lasix was started.  She was taking every other day Lasix and instructed to take Lasix 20 mg once daily.  Can take additional dose as needed for SOB/LE swelling.  I have spent a total of 30 minutes with patient reviewing chart, EKGs, labs and examining patient as well as establishing an assessment and plan that was discussed with the patient.  > 50% of time was spent in direct patient care.     Medication Adjustments/Labs and Tests Ordered: Current medicines are reviewed at length with the patient today.  Concerns regarding medicines are outlined above.   Tests Ordered: No orders of the defined types were placed in this encounter.    Medication Changes: No orders of the defined types were placed in this encounter.   Disposition:  Follow up  1 month after DCCV  Signed, Thanya Cegielski Verne Spurr, MD, 05/19/2023 11:15 AM    [facility] at [facility] [address], [place], [place] [address]

## 2023-05-19 NOTE — Patient Instructions (Addendum)
Medication Instructions:  Your physician has recommended you make the following change in your medication:  Stop taking Amiodarone 200 mg on 07/22/2023 Start taking Lasix 20 mg daily, May take an additional 20 mg daily as needed for shortness of breath or weight gain of 2-3 lbs/ day or 5 lbs in a week  Labwork: BNP to be done at [facility] today  Testing/Procedures: Your physician has requested that you have a lexiscan myoview. For further information please visit [email or web address] Please follow instruction sheet, as given.   Follow-Up: Your physician recommends that you schedule a follow-up appointment in: 6 month  Any Other Special Instructions Will Be Listed Below (If Applicable).  HEART FAILURE INSTRUCTION SHEET  Follow a low-salt diet-you are allowed no more than 2,000 mg of sodium per day. Watch your fluid intake. In general, you should not be taking more than 64 ounces a day (no more than 8 glasses per day). Sometimes we refer to this as "2 liters per day." This includes sources of water in food like soup, coffee, tea, milk etc. Weigh yourself on the same scale at the same time of the day preferably immediately after your first void. Keep a log of your weights. Call your doctor: (Anytime you feel any of the following symptoms)  3 lbs weight gain overnight or 5 lbs within a week Shortness of breath, with or without a day hacking cough Swelling in hands, feet or stomach If you have to sleep on extra pillows at night in order to breathe   IT IS IMPORTANT TO LET YOUR DOCTOR KNOW EARLY ON IF YOU ARE HAVING SYMPTOMS SO WE CAN HELP YOU!    If you need a refill on your cardiac medications before your next appointment, please call your pharmacy.

## 2023-05-20 ENCOUNTER — Other Ambulatory Visit (HOSPITAL_COMMUNITY)
Admission: RE | Admit: 2023-05-20 | Discharge: 2023-05-20 | Disposition: A | Discharge status: Home / Self Care | Payer: Medicare HMO | Source: Ambulatory Visit | Attending: Internal Medicine | Admitting: Internal Medicine

## 2023-05-20 DIAGNOSIS — R0609 Other forms of dyspnea: Secondary | ICD-10-CM | POA: Insufficient documentation

## 2023-05-20 LAB — BRAIN NATRIURETIC PEPTIDE: B Natriuretic Peptide: 314 pg/mL — ABNORMAL HIGH (ref 0.0–100.0)

## 2023-05-22 ENCOUNTER — Ambulatory Visit: Payer: Medicare HMO | Admitting: Internal Medicine

## 2023-05-23 ENCOUNTER — Other Ambulatory Visit: Payer: Medicare HMO | Admitting: Podiatry

## 2023-05-23 ENCOUNTER — Telehealth: Payer: Self-pay

## 2023-05-23 DIAGNOSIS — Z79899 Other long term (current) drug therapy: Secondary | ICD-10-CM | POA: Diagnosis not present

## 2023-05-23 DIAGNOSIS — R2689 Other abnormalities of gait and mobility: Secondary | ICD-10-CM | POA: Diagnosis not present

## 2023-05-23 DIAGNOSIS — I4892 Unspecified atrial flutter: Secondary | ICD-10-CM | POA: Diagnosis not present

## 2023-05-23 DIAGNOSIS — F418 Other specified anxiety disorders: Secondary | ICD-10-CM | POA: Diagnosis not present

## 2023-05-23 DIAGNOSIS — Z7901 Long term (current) use of anticoagulants: Secondary | ICD-10-CM | POA: Diagnosis not present

## 2023-05-23 DIAGNOSIS — J069 Acute upper respiratory infection, unspecified: Secondary | ICD-10-CM | POA: Diagnosis not present

## 2023-05-23 NOTE — Telephone Encounter (Signed)
-----   Message from Roseanne Reno, [place] sent at 05/23/2023  1:41 PM EDT -----  ----- Message ----- From: Marjo Bicker, MD Sent: 05/23/2023  10:43 AM EDT To: Roseanne Reno, CMA  BNP is mildly elevated, 314.  Increase p.o. Lasix from 40 mg once daily to twice daily.  Increase KCl from 10 mEq to 20 mEq once daily.  Obtain BMP in 5 days.

## 2023-05-23 NOTE — Telephone Encounter (Signed)
Called patient unable to leave message to provide results and instructions

## 2023-05-25 ENCOUNTER — Telehealth: Payer: Self-pay | Admitting: Cardiology

## 2023-05-25 NOTE — Telephone Encounter (Signed)
   Patient called answering service this afternoon to request advise on medications with pending lexiscan myoview tomorrow. Per patient, she developed a cough towards the end of the week and was given a steroid injection and short course of Doxycycline by her PCP on Friday. She wanted to know if doxycycline would interfere with the test. Advised her that this should not be an issue. I did confirm that she is only coughing, denies shortness of breath. She is aware that if she becomes dyspneic or develops other symptoms such as fever, the test should be postponed.  Sylvia Gold, PA-C

## 2023-05-26 ENCOUNTER — Telehealth: Payer: Self-pay

## 2023-05-26 ENCOUNTER — Ambulatory Visit (HOSPITAL_COMMUNITY)
Admission: RE | Admit: 2023-05-26 | Discharge: 2023-05-26 | Disposition: A | Discharge status: Home / Self Care | Payer: Medicare HMO | Source: Ambulatory Visit | Attending: Internal Medicine | Admitting: Internal Medicine

## 2023-05-26 ENCOUNTER — Encounter (HOSPITAL_BASED_OUTPATIENT_CLINIC_OR_DEPARTMENT_OTHER)
Admission: RE | Admit: 2023-05-26 | Discharge: 2023-05-26 | Disposition: A | Discharge status: Home / Self Care | Payer: Medicare HMO | Source: Ambulatory Visit | Attending: Internal Medicine | Admitting: Internal Medicine

## 2023-05-26 DIAGNOSIS — R079 Chest pain, unspecified: Secondary | ICD-10-CM | POA: Diagnosis not present

## 2023-05-26 DIAGNOSIS — R0609 Other forms of dyspnea: Secondary | ICD-10-CM

## 2023-05-26 LAB — NM MYOCAR MULTI W/SPECT W/WALL MOTION / EF
Base ST Depression (mm): 0 mm
LV dias vol: 64 mL (ref 46–106)
LV sys vol: 19 mL
Nuc Stress EF: 70 %
Peak HR: 81 {beats}/min
RATE: 0.4
Rest HR: 67 {beats}/min
Rest Nuclear Isotope Dose: 10.3 mCi
SDS: 2
SRS: 1
SSS: 3
ST Depression (mm): 0 mm
Stress Nuclear Isotope Dose: 29.3 mCi
TID: 1.33

## 2023-05-26 MED ORDER — TECHNETIUM TC 99M TETROFOSMIN IV KIT
29.3000 | PACK | Freq: Once | INTRAVENOUS | Status: AC | PRN
  Administered 2023-05-26: 29.3 via INTRAVENOUS

## 2023-05-26 MED ORDER — SODIUM CHLORIDE FLUSH 0.9 % IV SOLN
INTRAVENOUS | Status: AC
  Administered 2023-05-26: 10 mL via INTRAVENOUS
  Filled 2023-05-26: qty 10

## 2023-05-26 MED ORDER — TECHNETIUM TC 99M TETROFOSMIN IV KIT
10.3000 | PACK | Freq: Once | INTRAVENOUS | Status: AC | PRN
  Administered 2023-05-26: 10.3 via INTRAVENOUS

## 2023-05-26 MED ORDER — REGADENOSON 0.4 MG/5ML IV SOLN
INTRAVENOUS | Status: AC
  Administered 2023-05-26: 0.4 mg via INTRAVENOUS
  Filled 2023-05-26: qty 5

## 2023-05-26 NOTE — Telephone Encounter (Signed)
-----   Message from Ashley C Buck, CMA sent at 05/23/2023  1:41 PM EDT -----  ----- Message ----- From: Mallipeddi, Vishnu P, MD Sent: 05/23/2023  10:43 AM EDT To: Ashley C Buck, CMA  BNP is mildly elevated, 314.  Increase p.o. Lasix from 40 mg once daily to twice daily.  Increase KCl from 10 mEq to 20 mEq once daily.  Obtain BMP in 5 days.  

## 2023-05-26 NOTE — Telephone Encounter (Signed)
Called patient to go over results and instructions with medications. Spoke with daughter Cordelia Pen. Verbalized understanding, but wanted me to call back and tell patient as well. She was currently getting a stress test done.

## 2023-05-26 NOTE — Telephone Encounter (Signed)
Spoke with patient to give updated directions on medications and blood work that needed to be completed by 07/01. Advise to increase lasix to 40 mg twice a day and potassium chloride 20 meq once a day. Patient verbalized understanding and will be getting lab work done at [facility].

## 2023-05-30 NOTE — Telephone Encounter (Signed)
Called patient back to let her know that per Dr. Jenene Slicker if she lost weight, that is a good sign. Can decrease her p.o lasix from 40 mg BID to once daily. Patient verbalized understanding.

## 2023-05-30 NOTE — Telephone Encounter (Signed)
Called patient to inquire about how she was feeling. Patient concerned about not going to the bathroom more after her Lasix had been increased. Asked if she was short of breath, any chest pains or feeling an urgency or burning sensation when having to go to the bathroom she answered no to all of the above. She has been feeling weak. She stated that she lost weight checked 06/21-06/28 and went from 151.5-144.5 within that time frame. Water intake: 2 bottles a day  Wears compression stockings Stated that she will be going to the lab on Monday to have Labs done as requested since the medication change.  Has spoken with her PCP and is currently being treated for congestion and coughing with Doxycyline 100 mg for 14 days, took last dose today. She is concerned that all of this may be causing the way she feels and not using the bathroom as much.  Please Advice

## 2023-05-30 NOTE — Telephone Encounter (Signed)
Patient stated she has been taking medication as prescribed and she has not been peeing and does not feel like she has to go.  Patient stated she is concerned she may be dehydrated and want a call back to discuss this.

## 2023-06-02 ENCOUNTER — Other Ambulatory Visit (HOSPITAL_COMMUNITY)
Admission: RE | Admit: 2023-06-02 | Discharge: 2023-06-02 | Disposition: A | Discharge status: Home / Self Care | Payer: Medicare HMO | Source: Ambulatory Visit | Attending: Internal Medicine | Admitting: Internal Medicine

## 2023-06-02 ENCOUNTER — Ambulatory Visit (INDEPENDENT_AMBULATORY_CARE_PROVIDER_SITE_OTHER): Payer: Medicare HMO | Admitting: Podiatry

## 2023-06-02 DIAGNOSIS — L84 Corns and callosities: Secondary | ICD-10-CM

## 2023-06-02 DIAGNOSIS — R0609 Other forms of dyspnea: Secondary | ICD-10-CM | POA: Insufficient documentation

## 2023-06-02 DIAGNOSIS — L97911 Non-pressure chronic ulcer of unspecified part of right lower leg limited to breakdown of skin: Secondary | ICD-10-CM

## 2023-06-02 DIAGNOSIS — M2031 Hallux varus (acquired), right foot: Secondary | ICD-10-CM

## 2023-06-02 LAB — BASIC METABOLIC PANEL
Anion gap: 11 (ref 5–15)
BUN: 40 mg/dL — ABNORMAL HIGH (ref 8–23)
CO2: 25 mmol/L (ref 22–32)
Calcium: 8.9 mg/dL (ref 8.9–10.3)
Chloride: 100 mmol/L (ref 98–111)
Creatinine, Ser: 1.39 mg/dL — ABNORMAL HIGH (ref 0.44–1.00)
GFR, Estimated: 38 mL/min — ABNORMAL LOW (ref >60)
Glucose, Bld: 96 mg/dL (ref 70–99)
Potassium: 3.3 mmol/L — ABNORMAL LOW (ref 3.5–5.1)
Sodium: 136 mmol/L (ref 135–145)

## 2023-06-02 NOTE — Progress Notes (Unsigned)
Patient presents today to pick up custom orthotics   Patient was dispensed 1 pair of custom orthotics  Fit was satisfactory. Instructions for break-in and wear was reviewed and a copy was given to the patient.    

## 2023-06-10 ENCOUNTER — Telehealth: Payer: Self-pay

## 2023-06-10 DIAGNOSIS — I4819 Other persistent atrial fibrillation: Secondary | ICD-10-CM

## 2023-06-10 NOTE — Telephone Encounter (Signed)
Called patient to provided results and to repeat blood work in 3 days and stop taking lasix. Patient verbalized understanding. Placed order for lab to be done at [facility].

## 2023-06-10 NOTE — Telephone Encounter (Signed)
-----   Message from Marjo Bicker, MD sent at 06/09/2023  4:30 PM EDT ----- Serum creatinine increased from 0.93 to 1.39.  Stop Lasix.  Repeat BMP in 3 days.

## 2023-06-11 ENCOUNTER — Other Ambulatory Visit: Payer: Self-pay | Admitting: Gastroenterology

## 2023-06-11 DIAGNOSIS — D225 Melanocytic nevi of trunk: Secondary | ICD-10-CM | POA: Diagnosis not present

## 2023-06-11 DIAGNOSIS — Z1283 Encounter for screening for malignant neoplasm of skin: Secondary | ICD-10-CM | POA: Diagnosis not present

## 2023-06-13 ENCOUNTER — Other Ambulatory Visit (HOSPITAL_COMMUNITY)
Admission: RE | Admit: 2023-06-13 | Discharge: 2023-06-13 | Disposition: A | Discharge status: Home / Self Care | Payer: Medicare HMO | Source: Ambulatory Visit | Attending: Internal Medicine | Admitting: Internal Medicine

## 2023-06-13 DIAGNOSIS — I4819 Other persistent atrial fibrillation: Secondary | ICD-10-CM | POA: Insufficient documentation

## 2023-06-13 LAB — BASIC METABOLIC PANEL
Anion gap: 8 (ref 5–15)
BUN: 20 mg/dL (ref 8–23)
CO2: 24 mmol/L (ref 22–32)
Calcium: 8.4 mg/dL — ABNORMAL LOW (ref 8.9–10.3)
Chloride: 102 mmol/L (ref 98–111)
Creatinine, Ser: 1.05 mg/dL — ABNORMAL HIGH (ref 0.44–1.00)
GFR, Estimated: 53 mL/min — ABNORMAL LOW (ref >60)
Glucose, Bld: 79 mg/dL (ref 70–99)
Potassium: 4.1 mmol/L (ref 3.5–5.1)
Sodium: 134 mmol/L — ABNORMAL LOW (ref 135–145)

## 2023-06-16 ENCOUNTER — Encounter: Payer: Self-pay | Admitting: Internal Medicine

## 2023-06-16 ENCOUNTER — Ambulatory Visit: Payer: Medicare HMO | Admitting: Internal Medicine

## 2023-06-16 VITALS — BP 138/88 | HR 74 | Temp 97.8°F | Resp 16 | Wt 153.2 lb

## 2023-06-16 DIAGNOSIS — B999 Unspecified infectious disease: Secondary | ICD-10-CM | POA: Diagnosis not present

## 2023-06-16 DIAGNOSIS — J302 Other seasonal allergic rhinitis: Secondary | ICD-10-CM

## 2023-06-16 DIAGNOSIS — J4540 Moderate persistent asthma, uncomplicated: Secondary | ICD-10-CM | POA: Diagnosis not present

## 2023-06-16 DIAGNOSIS — J3089 Other allergic rhinitis: Secondary | ICD-10-CM

## 2023-06-16 MED ORDER — BUDESONIDE-FORMOTEROL FUMARATE 80-4.5 MCG/ACT IN AERO: 2.0000 | INHALATION_SPRAY | Freq: Two times a day (BID) | RESPIRATORY_TRACT | 5 refills | Status: DC

## 2023-06-16 MED ORDER — ALBUTEROL SULFATE HFA 108 (90 BASE) MCG/ACT IN AERS: 2.0000 | INHALATION_SPRAY | Freq: Four times a day (QID) | RESPIRATORY_TRACT | 1 refills | Status: AC | PRN

## 2023-06-16 MED ORDER — IPRATROPIUM BROMIDE 0.06 % NA SOLN: 2.0000 | Freq: Four times a day (QID) | NASAL | 5 refills | Status: DC | PRN

## 2023-06-16 MED ORDER — AZELASTINE HCL 0.1 % NA SOLN: 1.0000 | Freq: Two times a day (BID) | NASAL | 5 refills | Status: AC

## 2023-06-16 MED ORDER — FLUTICASONE PROPIONATE 50 MCG/ACT NA SUSP: 2.0000 | Freq: Every morning | NASAL | 5 refills | Status: DC

## 2023-06-16 NOTE — Patient Instructions (Addendum)
1. Mild persistent asthma without complication - Continue follow up with Dr. Sherene Sires. - Current cough is likely post infectious which should resolve with time.  - Maintenance inhaler: continue Symbicort 2 puffs twice daily.   - Rescue inhaler: Albuterol 2 puffs via spacer every 4-6 hours as needed for respiratory symptoms of cough, shortness of breath, or wheezing Asthma control goals:  Full participation in all desired activities (may need albuterol before activity) Albuterol use two times or less a week on average (not counting use with activity) Cough interfering with sleep two times or less a month Oral steroids no more than once a year No hospitalizations  2. Seasonal and perennial allergic rhinitis  - SPT 10/2022 positive to grasses, indoor molds, outdoor molds, dust mites, and cockroach  - Avoidance measures discussed.  - Use nasal saline rinses before nose sprays such as with Neilmed Sinus Rinse.  Use distilled water.   - Use Flonase 2 sprays each nostril daily. Aim upward and outward. - Use Azelastine 1 sprays each nostril twice daily. Aim upward and outward.  - Use Ipratroprium 1-2 sprays up to three times daily as needed for runny nose. Aim upward and outward. - Use Allegra 180mg  daily.  - Consider allergy shots as a means of long-term control.Allergy shots "re-train" and "reset" the immune system to ignore environmental allergens and decrease the resulting immune response to those allergens (sneezing, itchy watery eyes, runny nose, nasal congestion, etc).   Allergy shots improve symptoms in 75-85% of patients.   3. Recurrent infections  - Immune work up was completely normal.  - We do not need to do any further workup at this time.   Keep follow up in October in3 months.

## 2023-06-16 NOTE — Progress Notes (Signed)
FOLLOW UP Date of Service/Encounter:  06/16/23   Subjective:  [name] (DOB: 1941/10/11) is a 82 y.o. female who [facility] for follow up for moderate persistent asthma, allergic rhinoconjunctivitis, and recurrent infections.    History obtained from: chart review and patient. At last visit on 03/2023 with Dr. Dellis Anes, was doing well at the time on Symbicort, Flonase, Azelastine.  Asthma: Reports being sick about 6 weeks ago when her granddaughter had caught something.  Started with runny nose, hoarseness and then progressed to coughing.  Reports some mucous drainage that is mostly clear and sometimes yellow.  Has still had trouble with coughing.  Was using symbicort PRN but the past few days started taking it BID.  No dyspnea/wheezing. Has not needed albuterol much. Denies any ER visits/oral prednisone since last visit.  Reports Symbicort was very expensive  Allergies: Doing okay.  Does have runny nose with drainage and post nasal drip.  Not much itching or sneezing symptoms.  Taking Flonase daily and recently Ipratropium PRN; has not used Azelastine at all.    Past Medical History: Past Medical History:  Diagnosis Date   Anemia    Anxiety    Arthritis    Asthma    Atrial flutter (HCC)    Constipation    Dysrhythmia    GERD (gastroesophageal reflux disease)    High blood pressure    History of COVID-19 12/28/2020   Hypothyroidism    PAD (peripheral artery disease) (HCC)    bilateral legs   Peripheral edema    Peripheral neuropathy    bilateral feet at night occ   Psoriasis    Seasonal allergies    Skin cancer    Left eye brow   Varicose veins    Vertigo 11/23/2013    Objective:  BP 138/88   Pulse 74   Temp 97.8 F (36.6 C)   Resp 16   Wt 153 lb 4 oz (69.5 kg)   SpO2 91%   BMI 26.31 kg/m  Body mass index is 26.31 kg/m. Physical Exam: GEN: alert, well developed HEENT: clear conjunctiva, TM grey and  translucent, nose with mild inferior turbinate hypertrophy, pink nasal mucosa, clear rhinorrhea, + cobblestoning HEART: regular rate and rhythm, no murmur LUNGS: clear to auscultation bilaterally, no coughing, unlabored respiration SKIN: no rashes or lesions  Assessment:   1. Seasonal and perennial allergic rhinitis   2. Moderate persistent asthma without complication   3. Recurrent infections     Plan/Recommendations:  1. Mild persistent asthma without complication - Continue follow up with Dr. Sherene Sires. - Current cough is likely post infectious which should resolve with time.  - Maintenance inhaler: continue Symbicort 2 puffs twice daily.  If not covered, please find out from pharmacist what is covered.  - Rescue inhaler: Albuterol 2 puffs via spacer every 4-6 hours as needed for respiratory symptoms of cough, shortness of breath, or wheezing Asthma control goals:  Full participation in all desired activities (may need albuterol before activity) Albuterol use two times or less a week on average (not counting use with activity) Cough interfering with sleep two times or less a month Oral steroids no more than once a year No hospitalizations  2. Seasonal and perennial allergic rhinitis  - SPT 10/2022 positive to grasses, indoor molds, outdoor molds, dust mites, and cockroach  - Avoidance measures discussed.  - Use nasal saline rinses before nose sprays such as with Neilmed Sinus Rinse.  Use distilled water.   - Use Flonase 2 sprays each nostril daily. Aim upward and outward. - Use Azelastine 1 sprays each nostril twice daily. Aim upward and outward.  - Use Ipratroprium 1-2 sprays up to three times daily as needed for runny nose. Aim upward and outward. - Use Allegra 180mg  daily.  - Consider allergy shots as a means of long-term control.Allergy shots "re-train" and "reset" the immune system to ignore environmental allergens and decrease the resulting immune response to those  allergens (sneezing, itchy watery eyes, runny nose, nasal congestion, etc).   Allergy shots improve symptoms in 75-85% of patients.   3. Recurrent infections - Normal immunoglobulin, CH50, S pneumo, tetanus/diptheria titers 12/2022.  - We do not need to do any further workup at this time.    No follow-ups on file.  Alesia Morin, MD [facility]

## 2023-06-18 ENCOUNTER — Encounter: Payer: Self-pay | Admitting: Internal Medicine

## 2023-06-18 ENCOUNTER — Other Ambulatory Visit: Payer: Self-pay | Admitting: Internal Medicine

## 2023-06-18 ENCOUNTER — Telehealth: Payer: Self-pay

## 2023-06-18 MED ORDER — POTASSIUM CHLORIDE CRYS ER 10 MEQ PO TBCR: 10.0000 meq | EXTENDED_RELEASE_TABLET | Freq: Every day | ORAL | 3 refills | Status: DC

## 2023-06-18 NOTE — Telephone Encounter (Signed)
-----   Message from Vishnu P Mallipeddi sent at 06/18/2023  9:24 AM EDT ----- Serum creatinine improved from 1.39 to 1.05.  Continue to hold Lasix and take Lasix only as needed for SOB/LE swelling.

## 2023-06-18 NOTE — Telephone Encounter (Signed)
Tried calling patient no answer. Sent MyChart message and copy to PCP

## 2023-06-20 ENCOUNTER — Ambulatory Visit: Payer: Medicare HMO | Admitting: Family Medicine

## 2023-06-23 ENCOUNTER — Telehealth: Payer: Self-pay

## 2023-06-23 ENCOUNTER — Encounter: Payer: Self-pay | Admitting: Internal Medicine

## 2023-06-23 NOTE — Telephone Encounter (Signed)
Called patient in regards to clarification on her medication. Dr. Jenene Slicker wanted patient to  Continue to hold Lasix and take Lasix only as needed for shortness of breath and leg swelling. Take all other medications as prescribed. Patient verbalized understanding.

## 2023-06-24 ENCOUNTER — Ambulatory Visit: Payer: Medicare HMO | Admitting: Podiatry

## 2023-06-24 DIAGNOSIS — M7989 Other specified soft tissue disorders: Secondary | ICD-10-CM

## 2023-06-24 DIAGNOSIS — M2032 Hallux varus (acquired), left foot: Secondary | ICD-10-CM

## 2023-06-24 DIAGNOSIS — L84 Corns and callosities: Secondary | ICD-10-CM

## 2023-06-25 ENCOUNTER — Telehealth: Payer: Self-pay

## 2023-06-25 NOTE — Telephone Encounter (Signed)
Called patient in regards to MyChart question stated that she needed to send to the Pulmonary doctor has an appointment coming up

## 2023-06-26 DIAGNOSIS — R2689 Other abnormalities of gait and mobility: Secondary | ICD-10-CM | POA: Diagnosis not present

## 2023-06-26 DIAGNOSIS — J309 Allergic rhinitis, unspecified: Secondary | ICD-10-CM | POA: Diagnosis not present

## 2023-06-26 DIAGNOSIS — J4530 Mild persistent asthma, uncomplicated: Secondary | ICD-10-CM | POA: Diagnosis not present

## 2023-06-26 DIAGNOSIS — I4892 Unspecified atrial flutter: Secondary | ICD-10-CM | POA: Diagnosis not present

## 2023-06-26 DIAGNOSIS — F418 Other specified anxiety disorders: Secondary | ICD-10-CM | POA: Diagnosis not present

## 2023-06-26 DIAGNOSIS — J069 Acute upper respiratory infection, unspecified: Secondary | ICD-10-CM | POA: Diagnosis not present

## 2023-06-26 DIAGNOSIS — Z79899 Other long term (current) drug therapy: Secondary | ICD-10-CM | POA: Diagnosis not present

## 2023-06-26 DIAGNOSIS — Z7901 Long term (current) use of anticoagulants: Secondary | ICD-10-CM | POA: Diagnosis not present

## 2023-06-29 NOTE — Progress Notes (Signed)
Subjective: Chief Complaint  Patient presents with   Toe Pain    Pt came in for swollen great toe on the left. She said that it does not hurt just  swollen.   82 year old female presents Today with concerns of chronic splinter left big toe.  This been ongoing for greater than 1 year and has not changed.  She has no pain associated with it.  She keeps working to bring it up when she comes in for other appointments.  No open lesions.  No redness.  No pain.  Objective: AAO x3, NAD DP/PT pulses palpable bilaterally, CRT less than 3 seconds There is mild hyperkeratotic tissue present plantar aspect of right hallux without any dried blood or underlying ulceration today.  On the left, there is some mild edema present of the hallux there is no erythema or warmth.  Hallux malleus is present with decreased range of motion dorsiflexion of the hallux IPJ.  There is no area pinpoint tenderness otherwise. No pain with calf compression, swelling, warmth, erythema  Assessment: Preulcerative callus right foot; hallux valgus left hallux with swelling  Plan: -All treatment options discussed with the patient including all alternatives, risks, complications.  -Debrided the hyperkeratotic tissue with any complications or bleeding that is minimal today.  Continue offloading. She does have inserts in her shoes to help -For the left foot which she presents today for we offered x-rays.  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  Is not worse and that she has no pain associate with this.  Will continue to monitor.  Months for any ulcerations or skin breakdown or erythema, pain.  Return if symptoms worsen or fail to improve.  Vivi Barrack DPM

## 2023-07-01 ENCOUNTER — Other Ambulatory Visit: Payer: Self-pay | Admitting: Student

## 2023-07-02 ENCOUNTER — Other Ambulatory Visit: Payer: Self-pay | Admitting: Student

## 2023-07-09 ENCOUNTER — Telehealth: Payer: Self-pay | Admitting: Internal Medicine

## 2023-07-09 NOTE — Telephone Encounter (Signed)
Patient called stating she has been hoarse for 2 to 3 weeks and has tried everything that Dr. Allena Katz recommended at her appointment. The patient is requesting something else she can take to help get rid of her congestion. Patient states she can not take allegra.

## 2023-07-09 NOTE — Telephone Encounter (Signed)
Patient advised that she should be taking Potassium daily & new prescription was sent to [facility] on 06/18/23 at 12:44 pm.  She verbalized understanding & will call the pharmacy to request.

## 2023-07-09 NOTE — Telephone Encounter (Signed)
Pt c/o medication issue:  1. Name of Medication:   potassium chloride (KLOR-CON M) 10 MEQ tablet   2. How are you currently taking this medication (dosage and times per day)?   2 tablets daily  3. Are you having a reaction (difficulty breathing--STAT)?   4. What is your medication issue?    Patient stated she took her last 2 tablets last night.  Patient stated she wants clarification on how she should be taking this medication and will need a new prescription called in to her pharmacy - [facility] - [place], [place] - [address]

## 2023-07-19 NOTE — Progress Notes (Unsigned)
Subjective:    Patient ID: [name], female   DOB: 08-20-1941    MRN: [id number]    Brief patient profile:  21 yowf  Quit smokng  1999 in "perfect health" until  around 2012 cough/sob dx copd [place] but spirometry was wnl  08/12/12 and did fine on symbicort then changed over to spiriva respimat and good until Dec 2017 then bad cough/ wheeze  rx  UC pred x 2 and back on symbicort referred to pulmonary clinic 01/24/2017 by Dr Margo Aye for re-eval ? Copd    History of Present Illness  01/24/2017 1st [facility] office visit/ [name]   Chief Complaint  Patient presents with   Pulmonary Consult    Self referral for COPD.  She states that she was dxed in 2012. She states she was dxed with Bronchitis end of Dec 2017 and feels like she is just now getting over this. She c/o increased SOB over the past few wks and was started on Symbicort by PCP and this has helped some.   MMRC3 = can't walk 100 yards even at a slow pace at a flat grade s stopping due to sob  Can do HT but not [employer]  Sleeps fine/ cough starts up w/in min mucus light yellow / sorethroat esp in afternoons  Plan A = Automatic = symbicort 80 Take 2 puffs first thing in am and then another 2 puffs about 12 hours later and stop spiriva  Work on inhaler technique:   Plan B = Backup Only use your albuterol as a rescue medication t Pantoprazole (protonix) 40 mg   Take  30-60 min before first meal of the day and Pepcid (famotidine)  20 mg one @  bedtime until return to office - this is the best way to tell whether stomach acid is contributing to your problem.   Please see patient coordinator before you leave today  to schedule sinus CT > neg  GERD diet    02/10/18   NP  eval Stop Benadryl and Vitamin E .  Continue on Protonix and Pepcid .  GERD diet  Continue on Symbicort, rinse after use.  May use Delsym As needed for cough.     04/15/2017  f/u ov/[name] re: cough variant asthma/ chronic doe ? Etiology / Pos Atopy  Chief  Complaint  Patient presents with   Follow-up    Breathing is back to her normal baseline. She states still has occ dry cough.   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 Has seen Dr. Sherene Sires before.   Dyspnea:  150 ft to out building flat gets sob every time x one year prior to OV  but not really progressing  Cough: 1st thing in am then  better for rest of day  p cough up min mucoid sputum Sleeping: bed  is flat/ one pillow  SABA use: once a week  02: none  Covid status: vax max   Rec Pantoprazole (protonix) 40 mg   Take  30-60 min before first meal of the day and Pepcid (famotidine)  20 mg after supper or at bedtime   Plan A = Automatic = Always=   Symbicort 80 Take 2 puffs first thing in am and then another 2 puffs about 12 hours later.  Work on inhaler technique:  Plan B = Backup (to supplement plan A, not to replace it) Only use your albuterol inhaler as a rescue medication  Please remember to go to the lab department    - Allergy screen 08/01/2022 >  Eos 0.1 /  IgE  125.         09/12/2022  f/u ov/[name] office/[name] re: cough variant asthma  maint on symb 80 2bid  though hfa not optimal  Chief Complaint  Patient presents with   Follow-up    Patient has not been feeling well the last few weeks. Was walking more before he got sick and was feeling better but has not been walking since she has been sick/not feeling  well.   Dyspnea:  15 min twice daily walking  Cough: worse with onset of "allergies" but not better with prednisone / worse p stirs does not keep her up /min mucoid Sleeping: flat bed s resp cc  SABA use: none  02: none  Rec Work on inhaler technique:  GERD diet reviewed, bed blocks rec  See Dr Dellis Anes next if not satisfied>dx  mild asthma      07/21/2023  f/u ov/[name] office/[name] re: cough variant asthma  maint on symb 80  with marginal technique Chief Complaint  Patient presents with   Establish Care  Main complaint is nasal congestion Dyspnea:  RA [employer]/ pushing cart x 2 aisles = MMRC3 = can't walk 100 yards even at a slow pace at a flat grade s stopping due to sob   Cough: 1st thing in am / yellow but min vol  Sleeping: flat bed/ two pillow no resp cc  SABA use: not sure it helps  02: not using     No obvious day to day or daytime variability or assoc excess/ purulent sputum or mucus plugs or hemoptysis or cp or chest tightness, subjective wheeze or overt sinus or hb symptoms.    Also denies any obvious fluctuation of symptoms with weather or environmental changes or other aggravating or alleviating factors except as outlined above   No unusual exposure hx or h/o childhood pna/ asthma or knowledge of premature birth.  Current Allergies, Complete Past Medical History, Past Surgical History, Family History, and Social History were reviewed in [employer] record.  ROS  The following are not active complaints unless bolded Hoarseness, sore throat, dysphagia, dental problems, itching, sneezing,  nasal congestion or discharge of excess mucus or purulent secretions, ear ache,   fever, chills, sweats, unintended wt loss or wt gain, classically pleuritic or exertional cp,  orthopnea pnd or arm/hand swelling  or leg swelling, presyncope, palpitations, abdominal pain, anorexia, nausea, vomiting, diarrhea  or change in bowel habits or change in bladder  habits, change in stools or change in urine, dysuria, hematuria,  rash, arthralgias, visual complaints, headache, numbness, weakness or ataxia or problems with walking or coordination,  change in mood or  memory.        Current Meds  Medication Sig   acetaminophen (TYLENOL) 500  MG tablet Take 500-1,000 mg by mouth as needed for mild pain or moderate pain.   albuterol (VENTOLIN HFA) 108 (90 Base) MCG/ACT inhaler Inhale 2 puffs into the lungs every 6 (six) hours as needed for wheezing or shortness of breath.   ALPRAZolam (XANAX) 0.5 MG tablet Take 0.5 mg by mouth in the morning, at noon, and at bedtime. (1000, 1500, AT BEDTIME)   amiodarone (PACERONE) 200 MG tablet Take 1 tablet (200 mg total) by mouth daily for 21 days. Stop on 07/22/2023   apixaban (ELIQUIS) 5 MG TABS tablet Take 1 tablet (5 mg total) by mouth 2 (two) times daily.   azelastine (ASTELIN) 0.1 % nasal spray Place 1 spray into both nostrils 2 (two) times daily.   budesonide-formoterol (SYMBICORT) 80-4.5 MCG/ACT inhaler Inhale 2 puffs into the lungs 2 (two) times daily.   clotrimazole-betamethasone (LOTRISONE) cream Apply 1 Application topically daily.   diclofenac Sodium (VOLTAREN) 1 % GEL Apply 1 application topically as needed (back pain).   docusate sodium (COLACE) 100 MG capsule Take 100 mg by mouth in the morning.   ELDERBERRY PO Take 50 mg by mouth every evening.   famotidine (PEPCID) 20 MG tablet One after supper   fluocinonide gel (LIDEX) 0.05 % Apply 1 Application topically 2 (two) times daily.   fluticasone (FLONASE) 50 MCG/ACT nasal spray Place 2 sprays into both nostrils in the morning.   furosemide (LASIX) 20 MG tablet Take 1 tablet (20 mg total) by mouth daily. May take an additional 20 mg daily as needed for shortness of breath or weight gain of 2-3 lbs/ day or 5 lbs in a week   ipratropium (ATROVENT) 0.06 % nasal spray Place 2 sprays into both nostrils 4 (four) times daily as needed for rhinitis.   levothyroxine  (SYNTHROID) 50 MCG tablet Take 1 tablet (50 mcg total) by mouth daily before breakfast.   lidocaine (XYLOCAINE) 2 % solution Use as directed 5 mLs in the mouth or throat as needed for mouth pain. Gargle and spit 5mL up to three times daily as needed for throat pain.   losartan (COZAAR) 50 MG tablet Take 50 mg by mouth 2 (two) times daily.   Multiple Vitamin (MULTIVITAMIN WITH MINERALS) TABS tablet Take 1 tablet by mouth daily in the afternoon.   mupirocin ointment (BACTROBAN) 2 % APPLY TO AFFECTED AREA DAILY   NONFORMULARY OR COMPOUNDED ITEM Apply 1 Application topically 3 (three) times daily as needed (nerve pain.). Baclofen 2%/Diclofenac Sodium 3%/Gabapentin 5%/Lidocaine HCl 5%/Menthol 1% Topical Gel   pantoprazole (PROTONIX) 40 MG tablet TAKE 1 TABLET BY MOUTH ONCE A DAY.   Polyethyl Glycol-Propyl Glycol (LUBRICANT EYE DROPS) 0.4-0.3 % SOLN Place 1-2 drops into both eyes 3 (three) times daily as needed (dry/irritated eyes.).   polyethylene glycol (MIRALAX / GLYCOLAX) 17 g packet Take 17 g by mouth every evening.   potassium chloride (KLOR-CON) 10 MEQ tablet Take 10 mEq by mouth daily.   raloxifene (EVISTA) 60 MG tablet Take 1 tablet (60 mg total) by mouth every morning. (Patient taking differently: Take 60 mg by mouth every other day. IN THE MORNING)   venlafaxine XR (EFFEXOR-XR) 75 MG 24 hr capsule Take 75 mg by mouth daily after breakfast.   vitamin D3 (CHOLECALCIFEROL) 25 MCG tablet Take 1,000 Units by mouth daily.   ZINC CITRATE PO Take 50 mg by mouth every evening.              Objective:   Physical Exam   Wts  07/21/2023       154   09/12/2022     152 08/01/2022       149   04/15/2017       158   01/24/17 158 lb (71.7 kg)  01/22/17 157 lb (71.2 kg)  09/25/16 165 lb (74.8 kg)     Vital signs reviewed  07/21/2023  - Note at rest 02 sats  97% on RA   General appearance:    amb pale wf weathered facies nad    HEENT : Oropharynx  clear    Nasal turbinates nl    NECK :  without   apparent JVD/ palpable Nodes/TM    LUNGS: no acc muscle use,  Nl contour chest which is clear to A and P bilaterally without cough on insp or exp maneuvers   CV:  RRR  no s3 or murmur or increase in P2, and trace pitting with elastic hose in place both LEs  ABD:  soft and nontender with nl inspiratory excursion in the supine position. No bruits or organomegaly appreciated   MS:  unsteady awkward gait/ ext warm without deformities Or obvious joint restrictions  calf tenderness, cyanosis or clubbing    SKIN: warm and dry without lesions    NEURO:  alert, approp, nl sensorium with  no motor or cerebellar deficits apparent.       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

## 2023-07-21 ENCOUNTER — Ambulatory Visit (HOSPITAL_COMMUNITY)
Admission: RE | Admit: 2023-07-21 | Discharge: 2023-07-21 | Disposition: A | Discharge status: Home / Self Care | Payer: Medicare HMO | Source: Ambulatory Visit | Attending: Internal Medicine | Admitting: Internal Medicine

## 2023-07-21 ENCOUNTER — Ambulatory Visit: Payer: Medicare HMO | Admitting: Internal Medicine

## 2023-07-21 ENCOUNTER — Encounter: Payer: Self-pay | Admitting: Internal Medicine

## 2023-07-21 VITALS — BP 114/69 | HR 58 | Ht 64.0 in | Wt 154.0 lb

## 2023-07-21 DIAGNOSIS — R0609 Other forms of dyspnea: Secondary | ICD-10-CM | POA: Insufficient documentation

## 2023-07-21 DIAGNOSIS — J45991 Cough variant asthma: Secondary | ICD-10-CM

## 2023-07-21 DIAGNOSIS — R0602 Shortness of breath: Secondary | ICD-10-CM | POA: Diagnosis not present

## 2023-07-21 MED ORDER — PREDNISONE 10 MG PO TABS: ORAL_TABLET | ORAL | 0 refills | Status: DC

## 2023-07-21 NOTE — Patient Instructions (Addendum)
Make sure you check your oxygen saturation at your highest level of activity (NOT after you stop)  to be sure it stays over 90% and keep track of it at least once a week, more often if breathing getting worse, and let me know if losing ground. (Collect the dots to connect the dots approach)     Ok to try albuterol 15 min before an activity (on alternating days)  that you know would usually make you short of breath and see if it makes any difference and if makes none then don't take albuterol after activity unless you can't catch your breath as this means it's the resting that helps, not the albuterol.  Work on inhaler technique:  relax and gently blow all the way out then take a nice smooth full deep breath back in, triggering the inhaler at same time you start breathing in.  Hold breath in for at least  5 seconds if you can. Blow out symbicort out thru nose. Rinse and gargle with water when done.  If mouth or throat bother you at all,  try brushing teeth/gums/tongue with arm and hammer toothpaste/ make a slurry and gargle and spit out.    Please remember to go to the  x-ray department  @  [facility] for your tests - we will call you with the results when they are available      Prednisone 10 mg take  4 each am x 2 days,   2 each am x 2 days,  1 each am x 2 days and stop    Please remember to go to the lab department  after 1pm  for your tests - we will call you with the results when they are available.  Please schedule a follow up office visit in 4 weeks, sooner if needed  with all medications /inhalers/ solutions in hand so we can verify exactly what you are taking. This includes all medications from all doctors and over the counters

## 2023-07-21 NOTE — Assessment & Plan Note (Signed)
Allergy profile 01/24/2017 >  Eos 0.3 /  IgE  702  Dog > cat,Trees/grass/ ragweed    - 01/24/2017  continue symb 80 2bid  - Sinus CT 01/24/2017 >>> No evidence of sinusitis.  - Spirometry 04/15/2017  FEV1 2.11 (100%)  Ratio 80 with nl contour in effort indep portion  - 08/01/2022  After extensive coaching inhaler device,  effectiveness =    90% increase symb 80 to 2 bid and max rx for gerd x 6 weeks then return to regroup - Allergy screen 08/01/2022 >  Eos 0.1 /  IgE  125 - 07/21/2023  After extensive coaching inhaler device,  effectiveness =    75% hfa  Asthma component seems relatively well controlled on low dose symbicort so no changes needed with main and just needs to use saba more approp  Re SABA :  I spent extra time with pt today reviewing appropriate use of albuterol for prn use on exertion with the following points: 1) saba is for relief of sob that does not improve by walking a slower pace or resting but rather if the pt does not improve after trying this first. 2) If the pt is convinced, as many are, that saba helps recover from activity faster then it's easy to tell if this is the case by re-challenging : ie stop, take the inhaler, then p 5 minutes try the exact same activity (intensity of workload) that just caused the symptoms and see if they are substantially diminished or not after saba 3) if there is an activity that reproducibly causes the symptoms, try the saba 15 min before the activity on alternate days   If in fact the saba really does help, then fine to continue to use it prn but advised may need to look closer at the maintenance regimen being used to achieve better control of airways disease with exertion.

## 2023-07-21 NOTE — Assessment & Plan Note (Signed)
Onset 2102 p stopped smoking 1999 Spirometry 08/12/2012 FEV1 2.23 (98%)  Ratio 79  ? On symbicort  - 01/24/2017  Walked RA x 3 laps @ 185 ft each stopped due to  End of study, nl pace, no   desat   Min sob - Spirometry 04/15/2017  Completely nl including curvature  - 04/15/2017  Walked RA x 3 laps @ 185 ft each stopped due to  End of study, nl pace, min sob, no desat  - 08/01/2022   Walked on RA  x  3  lap(s) =  approx 450  ft  @ mod to fast pace, stopped due to end of study  with lowest 02 sats  96%  With minimal sob  - 07/21/2023   Walked on  RA   x  2  lap(s) =  approx 300  ft  @ nl pace, stopped due to  fatigue > SOB with lowest 02 sats 94%  Symptoms are  disproportionate to objective findings and not clear to what extent this is actually a pulmonary  problem but pt does appear to have difficult to sort out respiratory symptoms of unknown origin for which  DDX  = almost all start with A and  include Adherence, Ace Inhibitors, Acid Reflux, Active Sinus Disease, Alpha 1 Antitripsin deficiency, Anxiety masquerading as Airways dz,  ABPA,  Allergy(esp in young), Aspiration (esp in elderly), Adverse effects of meds,  Active smoking or Vaping, A bunch of PE's/clot burden (a few small clots can't cause this syndrome unless there is already severe underlying pulm or vascular dz with poor reserve),  Anemia or thyroid disorder, plus two Bs  = Bronchiectasis and Beta blocker use..and one C= CHF    Adherence is always the initial "prime suspect" and is a multilayered concern that requires a "trust but verify" approach in every patient - starting with knowing how to use medications, especially inhalers, correctly, keeping up with refills and understanding the fundamental difference between maintenance and prns vs those medications only taken for a very short course and then stopped and not refilled.  - see hfa teaching - return  with all meds in hand using a trust but verify approach to confirm accurate Medication   Reconciliation The principal here is that until we are certain that the  patients are doing what we've asked, it makes no sense to ask them to do more.   ? Allergy/asthma >  Prednisone 10 mg take  4 each am x 2 days,   2 each am x 2 days,  1 each am x 2 days and stop  >>>   Re SABA :  I spent extra time with pt today reviewing appropriate use of albuterol for prn use on exertion with the following points: 1) saba is for relief of sob that does not improve by walking a slower pace or resting but rather if the pt does not improve after trying this first. 2) If the pt is convinced, as many are, that saba helps recover from activity faster then it's easy to tell if this is the case by re-challenging : ie stop, take the inhaler, then p 5 minutes try the exact same activity (intensity of workload) that just caused the symptoms and see if they are substantially diminished or not after saba 3) if there is an activity that reproducibly causes the symptoms, try the saba 15 min before the activity on alternate days   If in fact the saba really does help, then  fine to continue to use it prn but advised may need to look closer at the maintenance regimen being used to achieve better control of airways disease with exertion.    ? Adverse drug effects on amiodarone > cxr ok/ check esr and serial sats walking   ? A bunch of PEs  > unlikely on DOAC   ? Chf > recheck BNP, was in intermediate range >>>   F/u in 4 weeks, call sooner prn   Each maintenance medication was reviewed in detail including emphasizing most importantly the difference between maintenance and prns and under what circumstances the prns are to be triggered using an action plan format where appropriate.  Total time for H and P, chart review, counseling, reviewing hfa device(s) , directly observing portions of ambulatory 02 saturation study/ and generating customized AVS unique to this office visit / same day charting > 40 min for multiple   refractory chronic respiratory  symptoms of uncertain etiology

## 2023-07-22 LAB — CBC WITH DIFFERENTIAL/PLATELET
Basophils Absolute: 0.1 10*3/uL (ref 0.0–0.2)
Basos: 1 %
EOS (ABSOLUTE): 0.1 10*3/uL (ref 0.0–0.4)
Eos: 1 %
Hematocrit: 31.2 % — ABNORMAL LOW (ref 34.0–46.6)
Hemoglobin: 10.3 g/dL — ABNORMAL LOW (ref 11.1–15.9)
Immature Grans (Abs): 0.1 10*3/uL (ref 0.0–0.1)
Immature Granulocytes: 1 %
Lymphocytes Absolute: 2.4 10*3/uL (ref 0.7–3.1)
Lymphs: 25 %
MCH: 31 pg (ref 26.6–33.0)
MCHC: 33 g/dL (ref 31.5–35.7)
MCV: 94 fL (ref 79–97)
Monocytes Absolute: 1 10*3/uL — ABNORMAL HIGH (ref 0.1–0.9)
Monocytes: 11 %
Neutrophils Absolute: 6 10*3/uL (ref 1.4–7.0)
Neutrophils: 61 %
Platelets: 227 10*3/uL (ref 150–450)
RBC: 3.32 x10E6/uL — ABNORMAL LOW (ref 3.77–5.28)
RDW: 15.1 % (ref 11.7–15.4)
WBC: 9.7 10*3/uL (ref 3.4–10.8)

## 2023-07-22 LAB — SEDIMENTATION RATE: Sed Rate: 5 mm/h (ref 0–40)

## 2023-07-22 LAB — BRAIN NATRIURETIC PEPTIDE: BNP: 1029.7 pg/mL — ABNORMAL HIGH (ref 0.0–100.0)

## 2023-07-25 ENCOUNTER — Telehealth: Payer: Self-pay | Admitting: Internal Medicine

## 2023-07-25 NOTE — Telephone Encounter (Addendum)
Clarification to previous message. BNP was checked by pulmonologist on 07/21/2023. Patient informed to weigh daily same time, same scale, same amount of clothes and preferably early morning upon waking up after first void. Advised if she gains 2-3 lbs in 1 day or 5 lbs in 1 week, to take prn lasix and contact our office. Advised patient to use lasix daily as needed for sob and LE swelling per last note and prescription updated to reflect these instructions.

## 2023-07-25 NOTE — Telephone Encounter (Signed)
Pt c/o medication issue:  1. Name of Medication:   furosemide (LASIX) 20 MG tablet    2. How are you currently taking this medication (dosage and times per day)?    3. Are you having a reaction (difficulty breathing--STAT)? no  4. What is your medication issue? Patient calling with question concerning medication. She is currently not tatking, but her PCP thinks she should. Please advise

## 2023-07-25 NOTE — Telephone Encounter (Signed)
Reports she was advised by her PCP that she should be taking furosemide based on her recent lab results that showed she was retaining fluid. Recent BNP from 07/21/2023 was 1029.7 per chart. Reports she has not taken any furosemide since being told to hold it on 06/10/2023. Advised that per lab work result on 06/18/2023 she was advised per Mallipeddi (Message from Vishnu P Mallipeddi sent at 06/18/2023  9:24 AM EDT -----Serum creatinine improved from 1.39 to 1.05.  Continue to hold Lasix and take Lasix only as needed for SOB/LE swelling). Advised that she can start taking lasix 20 daily as needed for SOB/LE swelling. Denies swelling or SOB at this time but says she had swelling in her legs one time since holding lasix that occurred when she was going to bed. Denies swelling in legs early mornings. Says she will start using lasix 20 mg as directed.

## 2023-07-26 ENCOUNTER — Other Ambulatory Visit: Payer: Self-pay | Admitting: Internal Medicine

## 2023-07-29 ENCOUNTER — Other Ambulatory Visit: Payer: Self-pay | Admitting: Internal Medicine

## 2023-07-30 ENCOUNTER — Telehealth: Payer: Self-pay | Admitting: *Deleted

## 2023-07-30 DIAGNOSIS — Z79899 Other long term (current) drug therapy: Secondary | ICD-10-CM

## 2023-07-30 DIAGNOSIS — R0609 Other forms of dyspnea: Secondary | ICD-10-CM

## 2023-07-30 MED ORDER — POTASSIUM CHLORIDE ER 10 MEQ PO TBCR: 20.0000 meq | EXTENDED_RELEASE_TABLET | Freq: Every day | ORAL | Status: DC

## 2023-07-30 MED ORDER — FUROSEMIDE 20 MG PO TABS: 40.0000 mg | ORAL_TABLET | Freq: Every day | ORAL | Status: DC

## 2023-07-30 NOTE — Telephone Encounter (Signed)
-----   Message from Vishnu P Mallipeddi sent at 07/30/2023 10:56 AM EDT ----- BNP is elevated and per pulm note, she reported worsening DOE. Please instruct her to take p.o lasix 40 mg once daily and get BMP in 5 days. If no improvement in SOB, needs to take an addiitonal dose of lasix. Increase Kcl from 10 to 20 mEq once daily. Please schedule follow-up in 1-2 weeks with APP/MD. ----- Message ----- From: Dorna Mai, CMA Sent: 07/24/2023   4:09 PM EDT To: Marjo Bicker, MD

## 2023-07-30 NOTE — Telephone Encounter (Signed)
Patient informed and verbalized understanding of plan. Reports she has enough lasix and K+ pills on hand and will get new rx sent after lab work next week if doses remain the same. Lab order placed for [facility].

## 2023-08-05 ENCOUNTER — Other Ambulatory Visit (HOSPITAL_COMMUNITY)
Admission: RE | Admit: 2023-08-05 | Discharge: 2023-08-05 | Disposition: A | Discharge status: Home / Self Care | Payer: Medicare HMO | Source: Ambulatory Visit | Attending: Internal Medicine | Admitting: Internal Medicine

## 2023-08-05 ENCOUNTER — Ambulatory Visit (HOSPITAL_COMMUNITY): Payer: Medicare HMO | Attending: Internal Medicine

## 2023-08-05 DIAGNOSIS — E11319 Type 2 diabetes mellitus with unspecified diabetic retinopathy without macular edema: Secondary | ICD-10-CM | POA: Diagnosis not present

## 2023-08-05 DIAGNOSIS — Z79899 Other long term (current) drug therapy: Secondary | ICD-10-CM | POA: Diagnosis not present

## 2023-08-05 DIAGNOSIS — M25561 Pain in right knee: Secondary | ICD-10-CM | POA: Diagnosis not present

## 2023-08-05 DIAGNOSIS — I4819 Other persistent atrial fibrillation: Secondary | ICD-10-CM | POA: Insufficient documentation

## 2023-08-05 DIAGNOSIS — I5032 Chronic diastolic (congestive) heart failure: Secondary | ICD-10-CM | POA: Insufficient documentation

## 2023-08-05 DIAGNOSIS — R269 Unspecified abnormalities of gait and mobility: Secondary | ICD-10-CM | POA: Insufficient documentation

## 2023-08-05 DIAGNOSIS — R0609 Other forms of dyspnea: Secondary | ICD-10-CM | POA: Insufficient documentation

## 2023-08-05 DIAGNOSIS — R2689 Other abnormalities of gait and mobility: Secondary | ICD-10-CM | POA: Diagnosis not present

## 2023-08-05 LAB — BASIC METABOLIC PANEL
Anion gap: 9 (ref 5–15)
BUN: 16 mg/dL (ref 8–23)
CO2: 26 mmol/L (ref 22–32)
Calcium: 8.4 mg/dL — ABNORMAL LOW (ref 8.9–10.3)
Chloride: 99 mmol/L (ref 98–111)
Creatinine, Ser: 1.04 mg/dL — ABNORMAL HIGH (ref 0.44–1.00)
GFR, Estimated: 54 mL/min — ABNORMAL LOW (ref >60)
Glucose, Bld: 106 mg/dL — ABNORMAL HIGH (ref 70–99)
Potassium: 3.5 mmol/L (ref 3.5–5.1)
Sodium: 134 mmol/L — ABNORMAL LOW (ref 135–145)

## 2023-08-05 NOTE — Therapy (Signed)
OUTPATIENT PHYSICAL THERAPY LOWER EXTREMITY EVALUATION   Patient Name: [name] MRN: [id number] DOB:Dec 29, 1940, 82 y.o., female Today's Date: 08/05/2023  END OF SESSION:  PT End of Session - 08/05/23 1124     Visit Number 1    Number of Visits 4    Date for PT Re-Evaluation 09/02/23    Authorization Type [employer]; submitted request for auth 9/3 AL    Authorization Time Period 25$ copay    PT Start Time 1120    PT Stop Time 1200    PT Time Calculation (min) 40 min    Activity Tolerance Patient tolerated treatment well    Behavior During Therapy [facility] for tasks assessed/performed             Past Medical History:  Diagnosis Date   Anemia    Anxiety    Arthritis    Asthma    Atrial flutter (HCC)    Constipation    Dysrhythmia    GERD (gastroesophageal reflux disease)    High blood pressure    History of COVID-19 12/28/2020   Hypothyroidism    PAD (peripheral artery disease) (HCC)    bilateral legs   Peripheral edema    Peripheral neuropathy    bilateral feet at night occ   Psoriasis    Seasonal allergies    Skin cancer    Left eye brow   Varicose veins    Vertigo 11/23/2013   Past Surgical History:  Procedure Laterality Date   APPENDECTOMY     patient unsure   BIOPSY  12/21/2020   Procedure: BIOPSY;  Surgeon: Corbin Ade, MD;  [facility];  Service: Endoscopy;;   BIOPSY  05/28/2021   Procedure: BIOPSY;  Surgeon: Lanelle Bal, DO;  [facility];  Service: Endoscopy;;   CARDIOVERSION N/A 04/04/2023   Procedure: CARDIOVERSION;  Surgeon: Antoine Poche, MD;  Location: [facility];  Service: Endoscopy;  Laterality: N/A;   CATARACT EXTRACTION W/PHACO Left 01/27/2017   Procedure: CATARACT EXTRACTION PHACO AND INTRAOCULAR LENS PLACEMENT (IOC);  Surgeon: Gemma Payor, MD;  Location: [facility];  Service: Ophthalmology;  Laterality: Left;  CDE: 29.90   CATARACT EXTRACTION W/PHACO Right 02/24/2017   Procedure: CATARACT EXTRACTION  PHACO AND INTRAOCULAR LENS PLACEMENT (IOC);  Surgeon: Gemma Payor, MD;  Location: [facility];  Service: Ophthalmology;  Laterality: Right;  CDE: 8.57   CHOLECYSTECTOMY     COLONOSCOPY N/A 10/01/2016   Procedure: COLONOSCOPY;  Surgeon: Corbin Ade, MD; pancolonic diverticulosis, melanosis coli, otherwise normal exam.  No recommendations to repeat colonoscopy.   COLONOSCOPY WITH PROPOFOL N/A 12/21/2020   Rourk: Diverticulosis   ESOPHAGOGASTRODUODENOSCOPY (EGD) WITH PROPOFOL N/A 12/21/2020   Rourk: Erosive reflux esophagitis, nonbleeding gastric ulcers with biopsy showing erosion/ulceration, negative for H. pylori.   ESOPHAGOGASTRODUODENOSCOPY (EGD) WITH PROPOFOL N/A 05/28/2021   Carver: gastritis (reactive gastropathy), no H.pylori   FLEXOR TENDON REPAIR Right 11/06/2017   Procedure: RIGHT WRIST FLEXOR TENDON REPAIRAND STT DEBRIEDMENT;  Surgeon: Betha Loa, MD;  Location: [facility];  Service: Orthopedics;  Laterality: Right;   MOUTH SURGERY     artery bleed   TOTAL HIP ARTHROPLASTY Right 01/31/2021   Procedure: TOTAL HIP ARTHROPLASTY ANTERIOR APPROACH;  Surgeon: Samson Frederic, MD;  Location: [facility];  Service: Orthopedics;  Laterality: Right;   TUBAL LIGATION     Patient Active Problem List   Diagnosis Date Noted   Chest pain of uncertain etiology 05/19/2023   Aortic regurgitation 05/19/2023  Unspecified atrial flutter (HCC) 02/20/2023   Seasonal and perennial allergic rhinitis 10/16/2022   Allergic rhinitis 09/10/2022   External hemorrhoids 08/22/2022   Neuropathy 07/30/2022   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/2022   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   Osteoarthritis of right knee 02/21/2022   Increased frequency of urination 02/20/2022   Intermittent confusion 02/20/2022   Urinary incontinence 02/20/2022   Posterior rhinorrhea 01/04/2022   Fatigue 12/24/2021   Impairment of balance 09/20/2021   Chronic insomnia 06/20/2021   Chronic low back pain 06/20/2021   Edema  of lower extremity 06/20/2021   Gastroesophageal reflux disease without esophagitis 06/20/2021   Hypothyroidism 06/20/2021   Major depressive disorder 06/20/2021   Osteopenia 06/20/2021   Mixed hyperlipidemia 06/15/2021   Type 2 diabetes mellitus (HCC) 06/15/2021   Gastritis and gastroduodenitis 06/14/2021   Mixed anxiety and depressive disorder 06/01/2021   Gastric ulcer 04/13/2021   Abdominal pain 03/15/2021   Fracture of femoral neck, right (HCC) 01/31/2021   Closed displaced fracture of right femoral neck (HCC) 01/31/2021   Indigestion 11/02/2020   Change in stool caliber 11/02/2020   Loss of weight 08/23/2020   Nausea without vomiting 06/15/2020   Tick bite 04/10/2020   Medication refill 04/10/2020   Acquired trigger finger 03/28/2020   Pain in right knee 10/08/2019   Cellulitis, leg 08/10/2019   Cellulitis 08/08/2019   Fever 08/08/2019   Cellulitis of foot 08/08/2019   Hypokalemia 08/08/2019   Hyponatremia 08/08/2019   Essential hypertension 08/08/2019   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   Adverse food reaction 09/24/2018   Mild persistent asthma without complication 09/24/2018   Pain of left hip joint 07/17/2018   DOE (dyspnea on exertion) 01/24/2017   Cough variant asthma  vs UACS 01/24/2017   High risk medication use 09/25/2016   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   Varicose veins 02/15/2014   Vertigo 11/23/2013   Pain in limb 10/04/2013   Hip bursitis 02/11/2013   Constipation 02/16/2009   RECTAL BLEEDING 02/16/2009   HIGH BLOOD PRESSURE 01/29/2008    PCP: Benita Stabile, MD  REFERRING PROVIDER: Benita Stabile, MD  REFERRING DIAG: R26.89 (ICD-10-CM) - Other abnormalities of gait and mobility  THERAPY DIAG:  Abnormality of gait and mobility  Rationale for Evaluation and Treatment: Rehabilitation  ONSET DATE: 2022  SUBJECTIVE:   SUBJECTIVE STATEMENT: Patient is well known to this clinic; was here last year for  similar issue.  She had a R THA back in 2022 after a fall and feels she has had trouble with her balance since then.  She has neuropathy in both legs and feet.  She states she gets off balance walking  PERTINENT HISTORY: Afib Neuropathy Primary caregiver for her husband who has Alzheimers Right knee pain PAIN:  Are you having pain? Yes: NPRS scale: 0-6/10 Pain location: legs Pain description: neuropathy pain Aggravating factors: mornings Relieving factors: cream  PRECAUTIONS: Fall    WEIGHT BEARING RESTRICTIONS: No  FALLS:  Has patient fallen in last 6 months? Yes. Number of falls 3 or more   OCCUPATION: retired  PLOF: Independent  PATIENT GOALS: get better balance  NEXT MD VISIT: PRN  OBJECTIVE:   DIAGNOSTIC FINDINGS: none recent  PATIENT SURVEYS:  ABC scale 55%  COGNITION: Overall cognitive status: Within functional limits for tasks assessed     SENSATION: Neuropathy in legs  EDEMA:  Wearing compression stockings   LOWER EXTREMITY ROM:  Active ROM Right eval Left eval  Hip flexion  Hip extension    Hip abduction    Hip adduction    Hip internal rotation    Hip external rotation    Knee flexion    Knee extension    Ankle dorsiflexion    Ankle plantarflexion    Ankle inversion    Ankle eversion     (Blank rows = not tested)  LOWER EXTREMITY MMT:  MMT Right eval Left eval  Hip flexion 4 4+  Hip extension    Hip abduction Noted weakness; right knee valgus with sit to stand   Hip adduction    Hip internal rotation    Hip external rotation    Knee flexion    Knee extension 4+ 5  Ankle dorsiflexion 4+ 4+  Ankle plantarflexion    Ankle inversion    Ankle eversion     (Blank rows = not tested)  FUNCTIONAL TESTS:  5 times sit to stand: 17.42 sec hands on thighs noted valgus right knee Dynamic Gait Index: next visit SLS  right unable; left unable  GAIT: Distance walked: 50 ft in PT clinic Assistive device utilized: None Level of  assistance: Modified independence and SBA Comments: occasional misstep; scissoring   TODAY'S TREATMENT:                                                                                                                              DATE: 08/05/23 physical therapy evaluation and HEP    PATIENT EDUCATION:  Education details: Patient educated on exam findings, POC, scope of PT, HEP, and what to expect next visit. Person educated: Patient Education method: Explanation, Demonstration, and Handouts Education comprehension: verbalized understanding, returned demonstration, verbal cues required, and tactile cues required  HOME EXERCISE PROGRAM: Access Code: [email or web address] URL: [email or web address] Date: 08/05/2023 Prepared by: [facility]  Exercises - Sit to Stand  - 2 x daily - 7 x weekly - 2 sets - 5 reps - Standing Single Leg Stance with Counter Support  - 2 x daily - 7 x weekly - 1 sets - 5 reps - 15 sec hold - Standing Tandem Balance with Counter Support  - 1 x daily - 7 x weekly - 1 sets - 5 reps - 15 sec hold  ASSESSMENT:  CLINICAL IMPRESSION: Patient is a 82 y.o. female who was seen today for physical therapy evaluation and treatment for R26.89 (ICD-10-CM) - Other abnormalities of gait and mobility.  Patient demonstrates decreased strength, balance deficits and gait abnormalities which are negatively impacting patient ability to perform ADLs and functional mobility tasks. Patient will benefit from skilled physical therapy services to address these deficits to improve level of function with ADLs, functional mobility tasks, and reduce risk for falls.    OBJECTIVE IMPAIRMENTS: Abnormal gait, decreased activity tolerance, decreased balance, decreased mobility, difficulty walking, decreased strength, and pain.   ACTIVITY LIMITATIONS: bending, standing, squatting, stairs, locomotion level, and caring for others  PARTICIPATION LIMITATIONS: meal prep, cleaning, shopping, and yard  work   [facility] POTENTIAL: Good  CLINICAL DECISION MAKING: Stable/uncomplicated  EVALUATION COMPLEXITY: Low   GOALS: Goals reviewed with patient? No  SHORT TERM GOALS: Target date: 08/19/2023 patient will be independent with initial HEP  Baseline: Goal status: INITIAL  2.  Patient will self report 30% improvement to improve tolerance for functional activity  Baseline:  Goal status: INITIAL   LONG TERM GOALS: Target date: 09/02/2023  Patient will be independent in self management strategies to improve quality of life and functional outcomes.  Baseline:  Goal status: INITIAL  2.  Patient will self report 50% improvement to improve tolerance for functional activity  Baseline:  Goal status: INITIAL  3.  Patient will improve ABC score by 10%  points to demonstrate improved perceived functional mobility  Baseline: 55 Goal status: INITIAL  4.  Patient will be able to stand on each leg SLS x 5" to demonstrate improved functional standing balance Baseline:  Goal status: INITIAL    PLAN:  PT FREQUENCY: 1x/week  PT DURATION: 4 weeks  PLANNED INTERVENTIONS: Therapeutic exercises, Therapeutic activity, Neuromuscular re-education, Balance training, Gait training, Patient/Family education, Joint manipulation, Joint mobilization, Stair training, Orthotic/Fit training, DME instructions, Aquatic Therapy, Dry Needling, Electrical stimulation, Spinal manipulation, Spinal mobilization, Cryotherapy, Moist heat, Compression bandaging, scar mobilization, Splintting, Taping, Traction, Ultrasound, Ionotophoresis 4mg /ml Dexamethasone, and Manual therapy   PLAN FOR NEXT SESSION: DGI; review HEP and goals; balance; functional strength; right hip strengthening. Will attend one time a week due to copay so please progress HEP each visit   12:04 PM, 08/05/23 Kourtni Stineman Small Nour Scalise MPT [place] [facility] [place] [phone]

## 2023-08-11 ENCOUNTER — Encounter: Payer: Self-pay | Admitting: Internal Medicine

## 2023-08-11 ENCOUNTER — Ambulatory Visit: Payer: Medicare HMO | Admitting: Internal Medicine

## 2023-08-11 VITALS — BP 112/70 | HR 71 | Ht 64.0 in | Wt 151.2 lb

## 2023-08-11 DIAGNOSIS — I5032 Chronic diastolic (congestive) heart failure: Secondary | ICD-10-CM | POA: Diagnosis not present

## 2023-08-11 DIAGNOSIS — Z79899 Other long term (current) drug therapy: Secondary | ICD-10-CM

## 2023-08-11 DIAGNOSIS — I4819 Other persistent atrial fibrillation: Secondary | ICD-10-CM | POA: Diagnosis not present

## 2023-08-11 DIAGNOSIS — E11319 Type 2 diabetes mellitus with unspecified diabetic retinopathy without macular edema: Secondary | ICD-10-CM | POA: Diagnosis not present

## 2023-08-11 MED ORDER — FUROSEMIDE 40 MG PO TABS: 40.0000 mg | ORAL_TABLET | Freq: Every day | ORAL | 1 refills | Status: DC

## 2023-08-11 NOTE — Progress Notes (Signed)
Cardiology Office Note  Date: 08/11/2023   ID: French, Sylvia January 04, 1941, MRN [id number]  PCP:  Benita Stabile, MD  Cardiologist:  Marjo Bicker, MD Electrophysiologist:  None    History of Present Illness: [name] is a 82 y.o. female known to have paroxysmal atrial flutter s/p DCCV in 2024, HTN is here for follow-up visit.  Patient takes care of her husband that has advanced Alzheimer's dementia.  He was diagnosed with Alzheimer's dementia in 2016 and has been worsening. As of now, her daughters and caretakers help take care of her husband. Patient says she was stressed out about taking care of her husband.  Patient has been symptomatic with SOB since 12/2022 and EKG showed atrial flutter for which she underwent DCCV in 01/2023. Completed 6 months therapy of amiodarone.  She had a recent visit with pulmonology where she complained of DOE and BNP was elevated to 1000s.  She was initially on p.o. Lasix 20 mg which was increased to 40 mg once daily.  She is here for follow-up visit.  EKG today showed NSR.  She reported improvement in her symptoms after increasing the Lasix to 40 mg once daily and also after taking her inhalers twice daily (previously she was taking it once a daily).  No other symptoms of chest pain, dizziness, presyncope and syncope.  No leg swelling.  Past Medical History:  Diagnosis Date   Anemia    Anxiety    Arthritis    Asthma    Atrial flutter (HCC)    Constipation    Dysrhythmia    GERD (gastroesophageal reflux disease)    High blood pressure    History of COVID-19 12/28/2020   Hypothyroidism    PAD (peripheral artery disease) (HCC)    bilateral legs   Peripheral edema    Peripheral neuropathy    bilateral feet at night occ   Psoriasis    Seasonal allergies    Skin cancer    Left eye brow   Varicose veins    Vertigo 11/23/2013    Past Surgical History:  Procedure Laterality Date   APPENDECTOMY     patient unsure   BIOPSY   12/21/2020   Procedure: BIOPSY;  Surgeon: Corbin Ade, MD;  [facility];  Service: Endoscopy;;   BIOPSY  05/28/2021   Procedure: BIOPSY;  Surgeon: Lanelle Bal, DO;  [facility];  Service: Endoscopy;;   CARDIOVERSION N/A 04/04/2023   Procedure: CARDIOVERSION;  Surgeon: Antoine Poche, MD;  Location: [facility];  Service: Endoscopy;  Laterality: N/A;   CATARACT EXTRACTION W/PHACO Left 01/27/2017   Procedure: CATARACT EXTRACTION PHACO AND INTRAOCULAR LENS PLACEMENT (IOC);  Surgeon: Gemma Payor, MD;  Location: [facility];  Service: Ophthalmology;  Laterality: Left;  CDE: 29.90   CATARACT EXTRACTION W/PHACO Right 02/24/2017   Procedure: CATARACT EXTRACTION PHACO AND INTRAOCULAR LENS PLACEMENT (IOC);  Surgeon: Gemma Payor, MD;  Location: [facility];  Service: Ophthalmology;  Laterality: Right;  CDE: 8.57   CHOLECYSTECTOMY     COLONOSCOPY N/A 10/01/2016   Procedure: COLONOSCOPY;  Surgeon: Corbin Ade, MD; pancolonic diverticulosis, melanosis coli, otherwise normal exam.  No recommendations to repeat colonoscopy.   COLONOSCOPY WITH PROPOFOL N/A 12/21/2020   Rourk: Diverticulosis   ESOPHAGOGASTRODUODENOSCOPY (EGD) WITH PROPOFOL N/A 12/21/2020   Rourk: Erosive reflux esophagitis, nonbleeding gastric ulcers with biopsy showing erosion/ulceration, negative for H. pylori.   ESOPHAGOGASTRODUODENOSCOPY (EGD) WITH PROPOFOL N/A 05/28/2021   Carver: gastritis (reactive gastropathy),  no H.pylori   FLEXOR TENDON REPAIR Right 11/06/2017   Procedure: RIGHT WRIST FLEXOR TENDON REPAIRAND STT DEBRIEDMENT;  Surgeon: Betha Loa, MD;  Location: [facility];  Service: Orthopedics;  Laterality: Right;   MOUTH SURGERY     artery bleed   TOTAL HIP ARTHROPLASTY Right 01/31/2021   Procedure: TOTAL HIP ARTHROPLASTY ANTERIOR APPROACH;  Surgeon: Samson Frederic, MD;  Location: [facility];  Service: Orthopedics;  Laterality: Right;   TUBAL LIGATION      Current Outpatient Medications   Medication Sig Dispense Refill   acetaminophen (TYLENOL) 500 MG tablet Take 500-1,000 mg by mouth as needed for mild pain or moderate pain.     albuterol (VENTOLIN HFA) 108 (90 Base) MCG/ACT inhaler Inhale 2 puffs into the lungs every 6 (six) hours as needed for wheezing or shortness of breath. 8 g 1   ALPRAZolam (XANAX) 0.5 MG tablet Take 0.5 mg by mouth in the morning, at noon, and at bedtime. (1000, 1500, AT BEDTIME)     amiodarone (PACERONE) 200 MG tablet Take 1 tablet (200 mg total) by mouth daily for 21 days. Stop on 07/22/2023 21 tablet 0   apixaban (ELIQUIS) 5 MG TABS tablet Take 1 tablet (5 mg total) by mouth 2 (two) times daily. 60 tablet 11   azelastine (ASTELIN) 0.1 % nasal spray Place 1 spray into both nostrils 2 (two) times daily. 30 mL 5   budesonide-formoterol (SYMBICORT) 80-4.5 MCG/ACT inhaler Inhale 2 puffs into the lungs 2 (two) times daily. 10.2 g 5   clotrimazole-betamethasone (LOTRISONE) cream Apply 1 Application topically daily. 30 g 0   diclofenac Sodium (VOLTAREN) 1 % GEL Apply 1 application topically as needed (back pain).     docusate sodium (COLACE) 100 MG capsule Take 100 mg by mouth in the morning.     ELDERBERRY PO Take 50 mg by mouth every evening.     famotidine (PEPCID) 20 MG tablet TAKE 1 TABLET BY MOUTH DAILY AFTER SUPPER 30 tablet 0   fluocinonide gel (LIDEX) 0.05 % Apply 1 Application topically 2 (two) times daily.     fluticasone (FLONASE) 50 MCG/ACT nasal spray Place 2 sprays into both nostrils in the morning. 16 g 5   furosemide (LASIX) 20 MG tablet Take 2 tablets (40 mg total) by mouth daily. May take an additional tablet daily as needed if SOB continues     ipratropium (ATROVENT) 0.06 % nasal spray Place 2 sprays into both nostrils 4 (four) times daily as needed for rhinitis. 15 mL 5   levothyroxine (SYNTHROID) 50 MCG tablet Take 1 tablet (50 mcg total) by mouth daily before breakfast. 90 tablet 3   lidocaine (XYLOCAINE) 2 % solution Use as directed 5 mLs  in the mouth or throat as needed for mouth pain. Gargle and spit 5mL up to three times daily as needed for throat pain. 100 mL 0   losartan (COZAAR) 50 MG tablet Take 50 mg by mouth 2 (two) times daily.     Multiple Vitamin (MULTIVITAMIN WITH MINERALS) TABS tablet Take 1 tablet by mouth daily in the afternoon.     mupirocin ointment (BACTROBAN) 2 % APPLY TO AFFECTED AREA DAILY 22 g 2   NONFORMULARY OR COMPOUNDED ITEM Apply 1 Application topically 3 (three) times daily as needed (nerve pain.). Baclofen 2%/Diclofenac Sodium 3%/Gabapentin 5%/Lidocaine HCl 5%/Menthol 1% Topical Gel     pantoprazole (PROTONIX) 40 MG tablet TAKE 1 TABLET BY MOUTH ONCE A DAY. 30 tablet 11   Polyethyl Glycol-Propyl Glycol (  LUBRICANT EYE DROPS) 0.4-0.3 % SOLN Place 1-2 drops into both eyes 3 (three) times daily as needed (dry/irritated eyes.).     polyethylene glycol (MIRALAX / GLYCOLAX) 17 g packet Take 17 g by mouth every evening.     potassium chloride (KLOR-CON) 10 MEQ tablet Take 2 tablets (20 mEq total) by mouth daily.     predniSONE (DELTASONE) 10 MG tablet Take  4 each am x 2 days,   2 each am x 2 days,  1 each am x 2 days and stop 14 tablet 0   raloxifene (EVISTA) 60 MG tablet Take 1 tablet (60 mg total) by mouth every morning. (Patient taking differently: Take 60 mg by mouth every other day. IN THE MORNING) 30 tablet 12   venlafaxine XR (EFFEXOR-XR) 75 MG 24 hr capsule Take 75 mg by mouth daily after breakfast.     vitamin D3 (CHOLECALCIFEROL) 25 MCG tablet Take 1,000 Units by mouth daily.     ZINC CITRATE PO Take 50 mg by mouth every evening.     No current facility-administered medications for this visit.   Allergies:  Amoxicillin-pot clavulanate, Ace inhibitors, Guaifenesin, Lisinopril, Cetirizine, Clarithromycin, Entex la, Gabapentin, Levofloxacin, and Meloxicam   Social History: The patient  reports that she quit smoking about 25 years ago. Her smoking use included cigarettes. She started smoking about 30  years ago. She has a 1.3 pack-year smoking history. She has been exposed to tobacco smoke. She has never used smokeless tobacco. She reports that she does not currently use alcohol after a past usage of about 1.0 standard drink of alcohol per week. She reports that she does not use drugs.   Family History: The patient's family history includes COPD in her brother; Heart attack in her father; Heart disease in her father; Hypertension in her brother and mother; Varicose Veins in her father.   ROS:  Please see the history of present illness. Otherwise, complete review of systems is positive for none.  All other systems are reviewed and negative.   Physical Exam: VS:  BP 112/70   Pulse 71   Ht 5\' 4"  (1.626 m)   Wt 151 lb 3.2 oz (68.6 kg)   SpO2 95%   BMI 25.95 kg/m , BMI Body mass index is 25.95 kg/m.  Wt Readings from Last 3 Encounters:  08/11/23 151 lb 3.2 oz (68.6 kg)  07/21/23 154 lb (69.9 kg)  06/16/23 153 lb 4 oz (69.5 kg)    General: Patient appears comfortable at rest. HEENT: Conjunctiva and lids normal, oropharynx clear with moist mucosa. Neck: Supple, no elevated JVP or carotid bruits, no thyromegaly. Lungs: Clear to auscultation, nonlabored breathing at rest. Cardiac: Regular rate and rhythm, no S3 or significant systolic murmur, no pericardial rub. Abdomen: Soft, nontender, no hepatomegaly, bowel sounds present, no guarding or rebound. Extremities: 1+ pitting edema around the ankle Skin: Warm and dry. Musculoskeletal: No kyphosis. Neuropsychiatric: Alert and oriented x3, affect grossly appropriate.  Recent Labwork: 02/20/2023: ALT 18; AST 24 03/18/2023: TSH 2.300 04/22/2023: Magnesium 2.0 07/21/2023: BNP 1,029.7; Hemoglobin 10.3; Platelets 227 08/05/2023: BUN 16; Creatinine, Ser 1.04; Potassium 3.5; Sodium 134     Component Value Date/Time   TRIG 92 09/11/2020 1547    Assessment and Plan: Patient is a 82 year old F known to have paroxysmal atrial flutter s/p DCCV in  01/2023, chronic diastolic heart failure, HTN is here for follow-up visit  Chronic diastolic heart failure: Worsening DOE with elevation of BNP in 1000s, significant improvement in DOE after  increasing p.o. Lasix from 20 mg to 40 mg once daily. Continue p.o. Lasix 40 mg once daily. Instructed her to take an additional dose of Lasix if she continues to have SOB.  Will get BMP before the next clinic visit.  Paroxysmal atrial flutter s/p DCCV in 3/24, CV score 3: EKG today showed normal sinus rhythm.  Currently not on any rate controlling agents.  Continue Eliquis 5 mg twice daily.  Mild aortic regurgitation and mild mitral regurgitation in 2024 echo: Next echo will be in 2027, serial surveillance with echocardiograms.  HTN, controlled: Did not tolerate amlodipine due to bilateral lower EXTR swelling that was switched to Lasix previously.  Continue losartan 50 mg twice daily, follows with PCP.    Disposition:  Follow up  3 months in [place], Huxley Shurley Verne Spurr, MD, 08/11/2023 12:13 PM    [facility] [address], [place], [place] [address]

## 2023-08-11 NOTE — Patient Instructions (Addendum)
Medication Instructions:  Your physician has recommended you make the following change in your medication:  Increase Lasix to 40 Mg once daily   Labwork: Have labs done before your next office visit   Testing/Procedures: None   Follow-Up: Your physician recommends that you schedule a follow-up appointment in: 3 Months   Any Other Special Instructions Will Be Listed Below (If Applicable).  If you need a refill on your cardiac medications before your next appointment, please call your pharmacy.

## 2023-08-13 ENCOUNTER — Ambulatory Visit: Payer: Medicare HMO | Admitting: Internal Medicine

## 2023-08-15 ENCOUNTER — Encounter (HOSPITAL_COMMUNITY): Payer: Self-pay

## 2023-08-15 ENCOUNTER — Ambulatory Visit (HOSPITAL_COMMUNITY): Payer: Medicare HMO

## 2023-08-15 DIAGNOSIS — R2689 Other abnormalities of gait and mobility: Secondary | ICD-10-CM

## 2023-08-15 DIAGNOSIS — R269 Unspecified abnormalities of gait and mobility: Secondary | ICD-10-CM | POA: Diagnosis not present

## 2023-08-15 DIAGNOSIS — M25561 Pain in right knee: Secondary | ICD-10-CM

## 2023-08-15 DIAGNOSIS — I4819 Other persistent atrial fibrillation: Secondary | ICD-10-CM | POA: Diagnosis not present

## 2023-08-15 DIAGNOSIS — I5032 Chronic diastolic (congestive) heart failure: Secondary | ICD-10-CM | POA: Insufficient documentation

## 2023-08-15 DIAGNOSIS — E11319 Type 2 diabetes mellitus with unspecified diabetic retinopathy without macular edema: Secondary | ICD-10-CM | POA: Diagnosis not present

## 2023-08-15 DIAGNOSIS — Z79899 Other long term (current) drug therapy: Secondary | ICD-10-CM | POA: Diagnosis not present

## 2023-08-15 NOTE — Therapy (Signed)
OUTPATIENT PHYSICAL THERAPY LOWER EXTREMITY TREATMENT   Patient Name: [name] MRN: [id number] DOB:08-Feb-1941, 82 y.o., female Today's Date: 08/15/2023  END OF SESSION:  PT End of Session - 08/15/23 1353     Visit Number 2    Number of Visits 4    Date for PT Re-Evaluation 09/02/23    Authorization Type [employer]; submitted request for auth 9/3 AL    Authorization Time Period 25$ copay    PT Start Time 1354    PT Stop Time 1434    PT Time Calculation (min) 40 min    Activity Tolerance Patient tolerated treatment well    Behavior During Therapy [facility] for tasks assessed/performed             Past Medical History:  Diagnosis Date   Anemia    Anxiety    Arthritis    Asthma    Atrial flutter (HCC)    Constipation    Dysrhythmia    GERD (gastroesophageal reflux disease)    High blood pressure    History of COVID-19 12/28/2020   Hypothyroidism    PAD (peripheral artery disease) (HCC)    bilateral legs   Peripheral edema    Peripheral neuropathy    bilateral feet at night occ   Psoriasis    Seasonal allergies    Skin cancer    Left eye brow   Varicose veins    Vertigo 11/23/2013   Past Surgical History:  Procedure Laterality Date   APPENDECTOMY     patient unsure   BIOPSY  12/21/2020   Procedure: BIOPSY;  Surgeon: Corbin Ade, MD;  [facility];  Service: Endoscopy;;   BIOPSY  05/28/2021   Procedure: BIOPSY;  Surgeon: Lanelle Bal, DO;  Location: [facility];  Service: Endoscopy;;   CARDIOVERSION N/A 04/04/2023   Procedure: CARDIOVERSION;  Surgeon: Antoine Poche, MD;  Location: [facility];  Service: Endoscopy;  Laterality: N/A;   CATARACT EXTRACTION W/PHACO Left 01/27/2017   Procedure: CATARACT EXTRACTION PHACO AND INTRAOCULAR LENS PLACEMENT (IOC);  Surgeon: Gemma Payor, MD;  Location: [facility];  Service: Ophthalmology;  Laterality: Left;  CDE: 29.90   CATARACT EXTRACTION W/PHACO Right 02/24/2017   Procedure: CATARACT EXTRACTION  PHACO AND INTRAOCULAR LENS PLACEMENT (IOC);  Surgeon: Gemma Payor, MD;  Location: [facility];  Service: Ophthalmology;  Laterality: Right;  CDE: 8.57   CHOLECYSTECTOMY     COLONOSCOPY N/A 10/01/2016   Procedure: COLONOSCOPY;  Surgeon: Corbin Ade, MD; pancolonic diverticulosis, melanosis coli, otherwise normal exam.  No recommendations to repeat colonoscopy.   COLONOSCOPY WITH PROPOFOL N/A 12/21/2020   Rourk: Diverticulosis   ESOPHAGOGASTRODUODENOSCOPY (EGD) WITH PROPOFOL N/A 12/21/2020   Rourk: Erosive reflux esophagitis, nonbleeding gastric ulcers with biopsy showing erosion/ulceration, negative for H. pylori.   ESOPHAGOGASTRODUODENOSCOPY (EGD) WITH PROPOFOL N/A 05/28/2021   Carver: gastritis (reactive gastropathy), no H.pylori   FLEXOR TENDON REPAIR Right 11/06/2017   Procedure: RIGHT WRIST FLEXOR TENDON REPAIRAND STT DEBRIEDMENT;  Surgeon: Betha Loa, MD;  Location: [facility];  Service: Orthopedics;  Laterality: Right;   MOUTH SURGERY     artery bleed   TOTAL HIP ARTHROPLASTY Right 01/31/2021   Procedure: TOTAL HIP ARTHROPLASTY ANTERIOR APPROACH;  Surgeon: Samson Frederic, MD;  Location: [facility];  Service: Orthopedics;  Laterality: Right;   TUBAL LIGATION     Patient Active Problem List   Diagnosis Date Noted   Chronic diastolic heart failure (HCC) 08/15/2023   Chest pain of uncertain etiology  05/19/2023   Aortic regurgitation 05/19/2023   Paroxysmal atrial flutter (HCC) 02/20/2023   Seasonal and perennial allergic rhinitis 10/16/2022   Allergic rhinitis 09/10/2022   External hemorrhoids 08/22/2022   Neuropathy 07/30/2022   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/2022   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   Osteoarthritis of right knee 02/21/2022   Increased frequency of urination 02/20/2022   Intermittent confusion 02/20/2022   Urinary incontinence 02/20/2022   Posterior rhinorrhea 01/04/2022   Fatigue 12/24/2021   Impairment of balance 09/20/2021   Chronic insomnia  06/20/2021   Chronic low back pain 06/20/2021   Edema of lower extremity 06/20/2021   Gastroesophageal reflux disease without esophagitis 06/20/2021   Hypothyroidism 06/20/2021   Major depressive disorder 06/20/2021   Osteopenia 06/20/2021   Mixed hyperlipidemia 06/15/2021   Type 2 diabetes mellitus (HCC) 06/15/2021   Gastritis and gastroduodenitis 06/14/2021   Mixed anxiety and depressive disorder 06/01/2021   Gastric ulcer 04/13/2021   Abdominal pain 03/15/2021   Fracture of femoral neck, right (HCC) 01/31/2021   Closed displaced fracture of right femoral neck (HCC) 01/31/2021   Indigestion 11/02/2020   Change in stool caliber 11/02/2020   Loss of weight 08/23/2020   Nausea without vomiting 06/15/2020   Tick bite 04/10/2020   Medication refill 04/10/2020   Acquired trigger finger 03/28/2020   Pain in right knee 10/08/2019   Cellulitis, leg 08/10/2019   Cellulitis 08/08/2019   Fever 08/08/2019   Cellulitis of foot 08/08/2019   Hypokalemia 08/08/2019   Hyponatremia 08/08/2019   Essential hypertension 08/08/2019   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   Adverse food reaction 09/24/2018   Mild persistent asthma without complication 09/24/2018   Pain of left hip joint 07/17/2018   DOE (dyspnea on exertion) 01/24/2017   Cough variant asthma  vs UACS 01/24/2017   High risk medication use 09/25/2016   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   Varicose veins 02/15/2014   Vertigo 11/23/2013   Pain in limb 10/04/2013   Hip bursitis 02/11/2013   Constipation 02/16/2009   RECTAL BLEEDING 02/16/2009   HIGH BLOOD PRESSURE 01/29/2008    PCP: Benita Stabile, MD  REFERRING PROVIDER: Benita Stabile, MD  REFERRING DIAG: R26.89 (ICD-10-CM) - Other abnormalities of gait and mobility  THERAPY DIAG:  Abnormality of gait and mobility  Right knee pain, unspecified chronicity  Other abnormalities of gait and mobility  Rationale for Evaluation and  Treatment: Rehabilitation  ONSET DATE: 2022  SUBJECTIVE:   SUBJECTIVE STATEMENT: 08/15/23:  Pt arrived a little late for apt, reports she is rushing.  Stated she has attempted her HEP, feels its is impossible to stand on one leg.  Eval:  Patient is well known to this clinic; was here last year for similar issue.  She had a R THA back in 2022 after a fall and feels she has had trouble with her balance since then.  She has neuropathy in both legs and feet.  She states she gets off balance walking  PERTINENT HISTORY: Afib Neuropathy Primary caregiver for her husband who has Alzheimers Right knee pain PAIN:  Are you having pain? Yes: NPRS scale: 0-6/10 Pain location: legs Pain description: neuropathy pain Aggravating factors: mornings Relieving factors: cream  PRECAUTIONS: Fall    WEIGHT BEARING RESTRICTIONS: No  FALLS:  Has patient fallen in last 6 months? Yes. Number of falls 3 or more   OCCUPATION: retired  PLOF: Independent  PATIENT GOALS: get better balance  NEXT MD VISIT: PRN  OBJECTIVE:  DIAGNOSTIC FINDINGS: none recent  PATIENT SURVEYS:  ABC scale 55%  COGNITION: Overall cognitive status: Within functional limits for tasks assessed     SENSATION: Neuropathy in legs  EDEMA:  Wearing compression stockings   LOWER EXTREMITY ROM:  Active ROM Right eval Left eval  Hip flexion    Hip extension    Hip abduction    Hip adduction    Hip internal rotation    Hip external rotation    Knee flexion    Knee extension    Ankle dorsiflexion    Ankle plantarflexion    Ankle inversion    Ankle eversion     (Blank rows = not tested)  LOWER EXTREMITY MMT:  MMT Right eval Left eval Right 08/15/23  Left 08/15/23  Hip flexion 4 4+    Hip extension   3/5 3/5  Hip abduction Noted weakness; right knee valgus with sit to stand  3+ 3+  Hip adduction      Hip internal rotation      Hip external rotation      Knee flexion      Knee extension 4+ 5     Ankle dorsiflexion 4+ 4+    Ankle plantarflexion      Ankle inversion      Ankle eversion       (Blank rows = not tested)  FUNCTIONAL TESTS:  5 times sit to stand: 17.42 sec hands on thighs noted valgus right knee Dynamic Gait Index: next visit SLS  right unable; left unable  GAIT: Distance walked: 50 ft in PT clinic Assistive device utilized: None Level of assistance: Modified independence and SBA Comments: occasional misstep; scissoring   TODAY'S TREATMENT:                                                                                                                              DATE:  08/15/23:   Reviewed goals Educated importance of HEP compliance for maximal benefits DGI MMT Sidelying: abd 2x 10 Supine: bridge 2x 10 STS 10x no HHA eccentric control Standing: SLS unable without HHA Tandem stance 1x 30" with HHA required  DGI 1. Gait level surface (1) Moderate Impairment: Walks 20', slow speed, abnormal gait pattern, evidence for imbalance. 2. Change in gait speed (1) Moderate Impairment: Makes only minor adjustments to walking speed, or accomplishes a change in speed with significant gait deviations, or changes speed but has significant gait deviations, or changes speed but loses balance but is able to recover and continue walking. 3. Gait with horizontal head turns (1) Moderate Impairment: Performs head turns with moderate change in gait velocity, slows down, staggers but recovers, can continue to walk. 4. Gait with vertical head turns (0) Severe Impairment: Performs task with severe disruption of gait, i.e., staggers outside 15" path, loses balance, stops, reaches for wall. 5. Gait and pivot turn (1) Moderate Impairment: Turns slowly, requires verbal cueing, requires several small steps to catch balance following  turn and stop. 6. Step over obstacle (1) Moderate Impairment: Is able to step over box but must stop, then step over. May require verbal cueing. 7. Step  around obstacles (2) Mild Impairment: Is able to step around both cones, but must slow down and adjust steps to clear cones. 8. Stairs (2) Mild Impairment: Alternating feet, must use rail.  TOTAL SCORE: 9 / 24  08/05/23 physical therapy evaluation and HEP    PATIENT EDUCATION:  Education details: Patient educated on exam findings, POC, scope of PT, HEP, and what to expect next visit. Person educated: Patient Education method: Explanation, Demonstration, and Handouts Education comprehension: verbalized understanding, returned demonstration, verbal cues required, and tactile cues required  HOME EXERCISE PROGRAM: Access Code: [email or web address] URL: [email or web address] Date: 08/05/2023 Prepared by: [facility]  Exercises - Sit to Stand  - 2 x daily - 7 x weekly - 2 sets - 5 reps - Standing Single Leg Stance with Counter Support  - 2 x daily - 7 x weekly - 1 sets - 5 reps - 15 sec hold - Standing Tandem Balance with Counter Support  - 1 x daily - 7 x weekly - 1 sets - 5 reps - 15 sec hold  08/15/23: - Supine Bridge  - 2 x daily - 7 x weekly - 1 sets - 10 reps - Sidelying Hip Abduction  - 2 x daily - 7 x weekly - 1 sets - 10 reps  ASSESSMENT:  CLINICAL IMPRESSION: 08/15/23:  Reviewed goals and educated importance of HEP compliance for maximal benefits.  Pt stated the balance exercises are difficult to complete at home.  DGI complete, pt ambulates with trendelenburg gait mechanics.  Further MMT revealed weak hip musculature, pt educated on how weakness can affect gait mechanics.  Added hip strengthening exercises to HEP with printout given and verbalized understanding.    Eval:  Patient is a 82 y.o. female who was seen today for physical therapy evaluation and treatment for R26.89 (ICD-10-CM) - Other abnormalities of gait and mobility.  Patient demonstrates decreased strength, balance deficits and gait abnormalities which are negatively impacting patient ability to perform ADLs and  functional mobility tasks. Patient will benefit from skilled physical therapy services to address these deficits to improve level of function with ADLs, functional mobility tasks, and reduce risk for falls.    OBJECTIVE IMPAIRMENTS: Abnormal gait, decreased activity tolerance, decreased balance, decreased mobility, difficulty walking, decreased strength, and pain.   ACTIVITY LIMITATIONS: bending, standing, squatting, stairs, locomotion level, and caring for others  PARTICIPATION LIMITATIONS: meal prep, cleaning, shopping, and yard work   [facility] POTENTIAL: Good  CLINICAL DECISION MAKING: Stable/uncomplicated  EVALUATION COMPLEXITY: Low   GOALS: Goals reviewed with patient? No  SHORT TERM GOALS: Target date: 08/19/2023 patient will be independent with initial HEP  Baseline: Goal status: IN PROGRESS  2.  Patient will self report 30% improvement to improve tolerance for functional activity  Baseline:  Goal status: IN PROGRESS   LONG TERM GOALS: Target date: 09/02/2023  Patient will be independent in self management strategies to improve quality of life and functional outcomes.  Baseline:  Goal status: IN PROGRESS  2.  Patient will self report 50% improvement to improve tolerance for functional activity  Baseline:  Goal status: IN PROGRESS  3.  Patient will improve ABC score by 10%  points to demonstrate improved perceived functional mobility  Baseline: 55 Goal status: IN PROGRESS  4.  Patient will be able to stand on each leg SLS x  5" to demonstrate improved functional standing balance Baseline:  Goal status: IN PROGRESS    PLAN:  PT FREQUENCY: 1x/week  PT DURATION: 4 weeks  PLANNED INTERVENTIONS: Therapeutic exercises, Therapeutic activity, Neuromuscular re-education, Balance training, Gait training, Patient/Family education, Joint manipulation, Joint mobilization, Stair training, Orthotic/Fit training, DME instructions, Aquatic Therapy, Dry Needling,  Electrical stimulation, Spinal manipulation, Spinal mobilization, Cryotherapy, Moist heat, Compression bandaging, scar mobilization, Splintting, Taping, Traction, Ultrasound, Ionotophoresis 4mg /ml Dexamethasone, and Manual therapy   PLAN FOR NEXT SESSION: balance; functional strength; right hip strengthening. Will attend one time a week due to copay so please progress HEP each visit.  Add sidestep next session, heel raises and squats.  Becky Sax, LPTA/CLT; CBIS [phone]  Juel Burrow, PTA 08/15/2023, 2:43 PM  2:43 PM, 08/15/23

## 2023-08-19 ENCOUNTER — Telehealth: Payer: Self-pay | Admitting: Internal Medicine

## 2023-08-19 ENCOUNTER — Encounter (HOSPITAL_COMMUNITY): Payer: Self-pay

## 2023-08-19 ENCOUNTER — Ambulatory Visit (HOSPITAL_COMMUNITY): Payer: Medicare HMO

## 2023-08-19 DIAGNOSIS — R2689 Other abnormalities of gait and mobility: Secondary | ICD-10-CM | POA: Diagnosis not present

## 2023-08-19 DIAGNOSIS — I5032 Chronic diastolic (congestive) heart failure: Secondary | ICD-10-CM | POA: Diagnosis not present

## 2023-08-19 DIAGNOSIS — M25561 Pain in right knee: Secondary | ICD-10-CM

## 2023-08-19 DIAGNOSIS — I4819 Other persistent atrial fibrillation: Secondary | ICD-10-CM | POA: Diagnosis not present

## 2023-08-19 DIAGNOSIS — Z79899 Other long term (current) drug therapy: Secondary | ICD-10-CM | POA: Diagnosis not present

## 2023-08-19 DIAGNOSIS — R269 Unspecified abnormalities of gait and mobility: Secondary | ICD-10-CM

## 2023-08-19 DIAGNOSIS — E11319 Type 2 diabetes mellitus with unspecified diabetic retinopathy without macular edema: Secondary | ICD-10-CM | POA: Diagnosis not present

## 2023-08-19 NOTE — Telephone Encounter (Signed)
Pt c/o medication issue:  1. Name of Medication:   apixaban (ELIQUIS) 5 MG TABS tablet    2. How are you currently taking this medication (dosage and times per day)?   Take 1 tablet (5 mg total) by mouth 2 (two) times daily.    3. Are you having a reaction (difficulty breathing--STAT)? No   4. What is your medication issue? Pt states she would like a cheaper alternative because she is about to be in the donut hole. Please advise

## 2023-08-19 NOTE — Progress Notes (Signed)
Subjective:    Patient ID: [name], female   DOB: 1941/02/24    MRN: [id number]    Brief patient profile:  82 yowf  Quit smokng  1999 in "perfect health" until  around 2012 cough/sob dx copd [place] but spirometry was wnl  08/12/12 and did fine on symbicort then changed over to spiriva respimat and good until Dec 2017 then bad cough/ wheeze  rx  UC pred x 2 and back on symbicort referred to pulmonary clinic 01/24/2017 by Dr Margo Aye for re-eval ? Copd    History of Present Illness  01/24/2017 1st [facility] office visit/ [name]   Chief Complaint  Patient presents with   Pulmonary Consult    Self referral for COPD.  She states that she was dxed in 2012. She states she was dxed with Bronchitis end of Dec 2017 and feels like she is just now getting over this. She c/o increased SOB over the past few wks and was started on Symbicort by PCP and this has helped some.   MMRC3 = can't walk 100 yards even at a slow pace at a flat grade s stopping due to sob  Can do HT but not [employer]  Sleeps fine/ cough starts up w/in min mucus light yellow / sorethroat esp in afternoons  Plan A = Automatic = symbicort 80 Take 2 puffs first thing in am and then another 2 puffs about 12 hours later and stop spiriva  Work on inhaler technique:   Plan B = Backup Only use your albuterol as a rescue medication t Pantoprazole (protonix) 40 mg   Take  30-60 min before first meal of the day and Pepcid (famotidine)  20 mg one @  bedtime until return to office - this is the best way to tell whether stomach acid is contributing to your problem.   Please see patient coordinator before you leave today  to schedule sinus CT > neg  GERD diet    02/10/18   NP  eval Stop Benadryl and Vitamin E .  Continue on Protonix and Pepcid .  GERD diet  Continue on Symbicort, rinse after use.  May use Delsym As needed for cough.     04/15/2017  f/u ov/[name] re: cough variant asthma/ chronic doe ? Etiology / Pos Atopy  Chief  Complaint  Patient presents with   Follow-up    Breathing is back to her normal baseline. She states still has occ dry cough.   doe still = MMRC3 = can't walk 100 yards even at a slow pace at a flat grade s stopping due to sob Does fine sleeping unless supine  with pnds interupting sleep on allegra s benefit but able to sleep on either side fine Rec Only change I would make in your medications is to try zyrtec 10 mg at bedtime in place of allegra as zyrtec works better sometimes on drainage If you continue to have flares Dr Margo Aye may want to refer you to an allergist    08/01/2022  f/u ov/[place] office/[name] re: cough variant asthma maint on symb  Chief Complaint  Patient presents with   Consult    Feels breathing is getting worse. Has seen Dr. Sherene Sires before.   Dyspnea:  150 ft to out building flat gets sob every time x one year prior to OV  but not really progressing  Cough: 1st thing in am then  better for rest of day  p cough up min mucoid sputum Sleeping: bed  is flat/ one pillow  SABA use: once a week  02: none  Covid status: vax max   Rec Pantoprazole (protonix) 40 mg   Take  30-60 min before first meal of the day and Pepcid (famotidine)  20 mg after supper or at bedtime   Plan A = Automatic = Always=   Symbicort 80 Take 2 puffs first thing in am and then another 2 puffs about 12 hours later.  Work on inhaler technique:  Plan B = Backup (to supplement plan A, not to replace it) Only use your albuterol inhaler as a rescue medication  Please remember to go to the lab department    - Allergy screen 08/01/2022 >  Eos 0.1 /  IgE  125.         09/12/2022  f/u ov/[place] office/[name] re: cough variant asthma  maint on symb 80 2bid  though hfa not optimal  Chief Complaint  Patient presents with   Follow-up    Patient has not been feeling well the last few weeks. Was walking more before he got sick and was feeling better but has not been walking since she has been sick/not feeling  well.   Dyspnea:  15 min twice daily walking  Cough: worse with onset of "allergies" but not better with prednisone / worse p stirs does not keep her up /min mucoid Sleeping: flat bed s resp cc  SABA use: none  02: none  Rec Work on inhaler technique:  GERD diet reviewed, bed blocks rec  See Dr Dellis Anes next if not satisfied>dx  mild asthma      07/21/2023  f/u ov/[place] office/[name] re: cough variant asthma  maint on symb 80  with marginal technique Chief Complaint  Patient presents with   Establish Care  Main complaint is nasal congestion Dyspnea:  RA [employer]/ pushing cart x 2 aisles = MMRC3 = can't walk 100 yards even at a slow pace at a flat grade s stopping due to sob   Cough: 1st thing in am / yellow but min vol  Sleeping: flat bed/ two pillow no resp cc  SABA use: not sure it helps  02: not using  Rec Make sure you check your oxygen saturation at your highest level of activity (NOT after you stop)  to be sure it stays over 90%  Ok to try albuterol 15 min before an activity (on alternating days)  that you know would usually make you short of breath  Work on inhaler technique:   Please remember to go to the  x-ray department  @  [facility] for your tests - we will call you with the results when they are available      Prednisone 10 mg take  4 each am x 2 days,   2 each am x 2 days,  1 each am x 2 days and stop    Please remember to go to the lab department  after 1pm  for your tests - we will call you with the results when they are available.  Please schedule a follow up office visit in 4 weeks, sooner if needed  with all medications /inhalers/ solutions in hand      08/20/2023  f/u ov/[place] office/[name] re: *** maint on *** did *** bring meds  No chief complaint on file.   Dyspnea:  *** Cough: *** Sleeping: ***   resp cc  SABA use: *** 02: ***  Lung cancer screening: ***   No obvious  day to day or daytime variability or assoc excess/ purulent  sputum or mucus plugs or hemoptysis or cp or chest tightness, subjective wheeze or overt sinus or hb symptoms.    Also denies any obvious fluctuation of symptoms with weather or environmental changes or other aggravating or alleviating factors except as outlined above   No unusual exposure hx or h/o childhood pna/ asthma or knowledge of premature birth.  Current Allergies, Complete Past Medical History, Past Surgical History, Family History, and Social History were reviewed in [employer] record.  ROS  The following are not active complaints unless bolded Hoarseness, sore throat, dysphagia, dental problems, itching, sneezing,  nasal congestion or discharge of excess mucus or purulent secretions, ear ache,   fever, chills, sweats, unintended wt loss or wt gain, classically pleuritic or exertional cp,  orthopnea pnd or arm/hand swelling  or leg swelling, presyncope, palpitations, abdominal pain, anorexia, nausea, vomiting, diarrhea  or change in bowel habits or change in bladder habits, change in stools or change in urine, dysuria, hematuria,  rash, arthralgias, visual complaints, headache, numbness, weakness or ataxia or problems with walking or coordination,  change in mood or  memory.        No outpatient medications have been marked as taking for the 08/20/23 encounter (Appointment) with Nyoka Cowden, MD.             Objective:   Physical Exam   Wts  08/20/2023        ***  07/21/2023       154   09/12/2022     152 08/01/2022       149   04/15/2017       158   01/24/17 158 lb (71.7 kg)  01/22/17 157 lb (71.2 kg)  09/25/16 165 lb (74.8 kg)     Vital signs reviewed  08/20/2023  - Note at rest 02 sats  ***% on ***   General appearance:    ***    , and trace pitting with elastic hose in place both LEs***         Assessment:

## 2023-08-19 NOTE — Therapy (Signed)
OUTPATIENT PHYSICAL THERAPY LOWER EXTREMITY TREATMENT   Patient Name: [name] MRN: [id number] DOB:12/11/1940, 82 y.o., female Today's Date: 08/19/2023  END OF SESSION:  PT End of Session - 08/19/23 1346     Visit Number 3    Number of Visits 4    Date for PT Re-Evaluation 09/02/23    Authorization Type [employer]; submitted request for auth 9/3 AL    Authorization Time Period 25$ copay    PT Start Time 1346    PT Stop Time 1430    PT Time Calculation (min) 44 min    Equipment Utilized During Treatment Gait belt    Behavior During Therapy WFL for tasks assessed/performed             Past Medical History:  Diagnosis Date   Anemia    Anxiety    Arthritis    Asthma    Atrial flutter (HCC)    Constipation    Dysrhythmia    GERD (gastroesophageal reflux disease)    High blood pressure    History of COVID-19 12/28/2020   Hypothyroidism    PAD (peripheral artery disease) (HCC)    bilateral legs   Peripheral edema    Peripheral neuropathy    bilateral feet at night occ   Psoriasis    Seasonal allergies    Skin cancer    Left eye brow   Varicose veins    Vertigo 11/23/2013   Past Surgical History:  Procedure Laterality Date   APPENDECTOMY     patient unsure   BIOPSY  12/21/2020   Procedure: BIOPSY;  Surgeon: Corbin Ade, MD;  [facility];  Service: Endoscopy;;   BIOPSY  05/28/2021   Procedure: BIOPSY;  Surgeon: Lanelle Bal, DO;  [facility];  Service: Endoscopy;;   CARDIOVERSION N/A 04/04/2023   Procedure: CARDIOVERSION;  Surgeon: Antoine Poche, MD;  Location: [facility];  Service: Endoscopy;  Laterality: N/A;   CATARACT EXTRACTION W/PHACO Left 01/27/2017   Procedure: CATARACT EXTRACTION PHACO AND INTRAOCULAR LENS PLACEMENT (IOC);  Surgeon: Gemma Payor, MD;  Location: [facility];  Service: Ophthalmology;  Laterality: Left;  CDE: 29.90   CATARACT EXTRACTION W/PHACO Right 02/24/2017   Procedure: CATARACT EXTRACTION PHACO  AND INTRAOCULAR LENS PLACEMENT (IOC);  Surgeon: Gemma Payor, MD;  Location: [facility];  Service: Ophthalmology;  Laterality: Right;  CDE: 8.57   CHOLECYSTECTOMY     COLONOSCOPY N/A 10/01/2016   Procedure: COLONOSCOPY;  Surgeon: Corbin Ade, MD; pancolonic diverticulosis, melanosis coli, otherwise normal exam.  No recommendations to repeat colonoscopy.   COLONOSCOPY WITH PROPOFOL N/A 12/21/2020   Rourk: Diverticulosis   ESOPHAGOGASTRODUODENOSCOPY (EGD) WITH PROPOFOL N/A 12/21/2020   Rourk: Erosive reflux esophagitis, nonbleeding gastric ulcers with biopsy showing erosion/ulceration, negative for H. pylori.   ESOPHAGOGASTRODUODENOSCOPY (EGD) WITH PROPOFOL N/A 05/28/2021   Carver: gastritis (reactive gastropathy), no H.pylori   FLEXOR TENDON REPAIR Right 11/06/2017   Procedure: RIGHT WRIST FLEXOR TENDON REPAIRAND STT DEBRIEDMENT;  Surgeon: Betha Loa, MD;  Location: [facility];  Service: Orthopedics;  Laterality: Right;   MOUTH SURGERY     artery bleed   TOTAL HIP ARTHROPLASTY Right 01/31/2021   Procedure: TOTAL HIP ARTHROPLASTY ANTERIOR APPROACH;  Surgeon: Samson Frederic, MD;  Location: [facility];  Service: Orthopedics;  Laterality: Right;   TUBAL LIGATION     Patient Active Problem List   Diagnosis Date Noted   Chronic diastolic heart failure (HCC) 08/15/2023   Chest pain of uncertain etiology  05/19/2023   Aortic regurgitation 05/19/2023   Paroxysmal atrial flutter (HCC) 02/20/2023   Seasonal and perennial allergic rhinitis 10/16/2022   Allergic rhinitis 09/10/2022   External hemorrhoids 08/22/2022   Neuropathy 07/30/2022   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/2022   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   Osteoarthritis of right knee 02/21/2022   Increased frequency of urination 02/20/2022   Intermittent confusion 02/20/2022   Urinary incontinence 02/20/2022   Posterior rhinorrhea 01/04/2022   Fatigue 12/24/2021   Impairment of balance 09/20/2021   Chronic insomnia 06/20/2021    Chronic low back pain 06/20/2021   Edema of lower extremity 06/20/2021   Gastroesophageal reflux disease without esophagitis 06/20/2021   Hypothyroidism 06/20/2021   Major depressive disorder 06/20/2021   Osteopenia 06/20/2021   Mixed hyperlipidemia 06/15/2021   Type 2 diabetes mellitus (HCC) 06/15/2021   Gastritis and gastroduodenitis 06/14/2021   Mixed anxiety and depressive disorder 06/01/2021   Gastric ulcer 04/13/2021   Abdominal pain 03/15/2021   Fracture of femoral neck, right (HCC) 01/31/2021   Closed displaced fracture of right femoral neck (HCC) 01/31/2021   Indigestion 11/02/2020   Change in stool caliber 11/02/2020   Loss of weight 08/23/2020   Nausea without vomiting 06/15/2020   Tick bite 04/10/2020   Medication refill 04/10/2020   Acquired trigger finger 03/28/2020   Pain in right knee 10/08/2019   Cellulitis, leg 08/10/2019   Cellulitis 08/08/2019   Fever 08/08/2019   Cellulitis of foot 08/08/2019   Hypokalemia 08/08/2019   Hyponatremia 08/08/2019   Essential hypertension 08/08/2019   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   Adverse food reaction 09/24/2018   Mild persistent asthma without complication 09/24/2018   Pain of left hip joint 07/17/2018   DOE (dyspnea on exertion) 01/24/2017   Cough variant asthma  vs UACS 01/24/2017   High risk medication use 09/25/2016   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   Varicose veins 02/15/2014   Vertigo 11/23/2013   Pain in limb 10/04/2013   Hip bursitis 02/11/2013   Constipation 02/16/2009   RECTAL BLEEDING 02/16/2009   HIGH BLOOD PRESSURE 01/29/2008    PCP: Benita Stabile, MD  REFERRING PROVIDER: Benita Stabile, MD  REFERRING DIAG: R26.89 (ICD-10-CM) - Other abnormalities of gait and mobility  THERAPY DIAG:  Abnormality of gait and mobility  Right knee pain, unspecified chronicity  Other abnormalities of gait and mobility  Rationale for Evaluation and Treatment:  Rehabilitation  ONSET DATE: 2022  SUBJECTIVE:   SUBJECTIVE STATEMENT: 08/19/23:  Pt stated she has knots on her shins so she stopped doing the exercises.  No reports of pain today.    Eval:  Patient is well known to this clinic; was here last year for similar issue.  She had a R THA back in 2022 after a fall and feels she has had trouble with her balance since then.  She has neuropathy in both legs and feet.  She states she gets off balance walking  PERTINENT HISTORY: Afib Neuropathy Primary caregiver for her husband who has Alzheimers Right knee pain PAIN:  Are you having pain? Yes: NPRS scale: 0-6/10 Pain location: legs Pain description: neuropathy pain Aggravating factors: mornings Relieving factors: cream  PRECAUTIONS: Fall    WEIGHT BEARING RESTRICTIONS: No  FALLS:  Has patient fallen in last 6 months? Yes. Number of falls 3 or more   OCCUPATION: retired  PLOF: Independent  PATIENT GOALS: get better balance  NEXT MD VISIT: PRN  OBJECTIVE:   DIAGNOSTIC FINDINGS: none  recent  PATIENT SURVEYS:  ABC scale 55%  COGNITION: Overall cognitive status: Within functional limits for tasks assessed     SENSATION: Neuropathy in legs  EDEMA:  Wearing compression stockings   LOWER EXTREMITY ROM:  Active ROM Right 08/19/23 Left 08/19/23  Hip flexion    Hip extension    Hip abduction    Hip adduction    Hip internal rotation    Hip external rotation    Knee flexion    Knee extension    Ankle dorsiflexion 2 4  Ankle plantarflexion    Ankle inversion    Ankle eversion     (Blank rows = not tested)  LOWER EXTREMITY MMT:  MMT Right eval Left eval Right 08/15/23  Left 08/15/23  Hip flexion 4 4+    Hip extension   3/5 3/5  Hip abduction Noted weakness; right knee valgus with sit to stand  3+ 3+  Hip adduction      Hip internal rotation      Hip external rotation      Knee flexion      Knee extension 4+ 5    Ankle dorsiflexion 4+ 4+    Ankle  plantarflexion      Ankle inversion      Ankle eversion       (Blank rows = not tested)  FUNCTIONAL TESTS:  5 times sit to stand: 17.42 sec hands on thighs noted valgus right knee Dynamic Gait Index: next visit SLS  right unable; left unable  GAIT: Distance walked: 50 ft in PT clinic Assistive device utilized: None Level of assistance: Modified independence and SBA Comments: occasional misstep; scissoring   TODAY'S TREATMENT:                                                                                                                              DATE:  08/19/23: STS 10x Heel raise 2x 10 Toe raises 10x noted limited range so completed in seated Ankle ROM measurement Gastroc stretch 2x 30" Heel to toe gait mechanics Abduction 10x 3-5" holds Sidestep 3RT (RTB around thigh last set)  08/15/23:   Reviewed goals Educated importance of HEP compliance for maximal benefits DGI MMT Sidelying: abd 2x 10 Supine: bridge 2x 10 STS 10x no HHA eccentric control Standing: SLS unable without HHA Tandem stance 1x 30" with HHA required  DGI 1. Gait level surface (1) Moderate Impairment: Walks 20', slow speed, abnormal gait pattern, evidence for imbalance. 2. Change in gait speed (1) Moderate Impairment: Makes only minor adjustments to walking speed, or accomplishes a change in speed with significant gait deviations, or changes speed but has significant gait deviations, or changes speed but loses balance but is able to recover and continue walking. 3. Gait with horizontal head turns (1) Moderate Impairment: Performs head turns with moderate change in gait velocity, slows down, staggers but recovers, can continue to walk. 4. Gait with vertical head turns (0) Severe Impairment: Performs  task with severe disruption of gait, i.e., staggers outside 15" path, loses balance, stops, reaches for wall. 5. Gait and pivot turn (1) Moderate Impairment: Turns slowly, requires verbal cueing, requires  several small steps to catch balance following turn and stop. 6. Step over obstacle (1) Moderate Impairment: Is able to step over box but must stop, then step over. May require verbal cueing. 7. Step around obstacles (2) Mild Impairment: Is able to step around both cones, but must slow down and adjust steps to clear cones. 8. Stairs (2) Mild Impairment: Alternating feet, must use rail.  TOTAL SCORE: 9 / 24  08/05/23 physical therapy evaluation and HEP    PATIENT EDUCATION:  Education details: Patient educated on exam findings, POC, scope of PT, HEP, and what to expect next visit. Person educated: Patient Education method: Explanation, Demonstration, and Handouts Education comprehension: verbalized understanding, returned demonstration, verbal cues required, and tactile cues required  HOME EXERCISE PROGRAM: Access Code: [email or web address] URL: [email or web address] Date: 08/05/2023 Prepared by: [facility]  Exercises - Sit to Stand  - 2 x daily - 7 x weekly - 2 sets - 5 reps - Standing Single Leg Stance with Counter Support  - 2 x daily - 7 x weekly - 1 sets - 5 reps - 15 sec hold - Standing Tandem Balance with Counter Support  - 1 x daily - 7 x weekly - 1 sets - 5 reps - 15 sec hold  08/15/23: - Supine Bridge  - 2 x daily - 7 x weekly - 1 sets - 10 reps - Sidelying Hip Abduction  - 2 x daily - 7 x weekly - 1 sets - 10 reps  08/19/23 - Heel Toe Raises with Counter Support  - 1 x daily - 7 x weekly - 3 sets - 10 reps - Standing Gastroc Stretch  - 1 x daily - 7 x weekly - 1 sets - 2 reps - 30" hold - Standing Hip Abduction with Counter Support  - 2 x daily - 7 x weekly - 1 sets - 10 reps ASSESSMENT:  CLINICAL IMPRESSION: 08/19/23:  Pt assured exercises will not cause bumps on skin.  Educated purpose of exercises and encouraged to increase compliance with current exercise program.  Added heel and toe raises with noted limited ankle range and weakness.  Added gastroc stretch for  ankle mobility.  Heel to toe gait mechanics instructed.  Pt continues with trendelenburg gait mechanics, added hip strengthening exercises to POC and added to HEP with printout given and verbalized understanding.    Eval:  Patient is a 82 y.o. female who was seen today for physical therapy evaluation and treatment for R26.89 (ICD-10-CM) - Other abnormalities of gait and mobility.  Patient demonstrates decreased strength, balance deficits and gait abnormalities which are negatively impacting patient ability to perform ADLs and functional mobility tasks. Patient will benefit from skilled physical therapy services to address these deficits to improve level of function with ADLs, functional mobility tasks, and reduce risk for falls.    OBJECTIVE IMPAIRMENTS: Abnormal gait, decreased activity tolerance, decreased balance, decreased mobility, difficulty walking, decreased strength, and pain.   ACTIVITY LIMITATIONS: bending, standing, squatting, stairs, locomotion level, and caring for others  PARTICIPATION LIMITATIONS: meal prep, cleaning, shopping, and yard work   [facility] POTENTIAL: Good  CLINICAL DECISION MAKING: Stable/uncomplicated  EVALUATION COMPLEXITY: Low   GOALS: Goals reviewed with patient? No  SHORT TERM GOALS: Target date: 08/19/2023 patient will be independent with initial HEP  Baseline: Goal status:  IN PROGRESS  2.  Patient will self report 30% improvement to improve tolerance for functional activity  Baseline:  Goal status: IN PROGRESS   LONG TERM GOALS: Target date: 09/02/2023  Patient will be independent in self management strategies to improve quality of life and functional outcomes.  Baseline:  Goal status: IN PROGRESS  2.  Patient will self report 50% improvement to improve tolerance for functional activity  Baseline:  Goal status: IN PROGRESS  3.  Patient will improve ABC score by 10%  points to demonstrate improved perceived functional mobility  Baseline:  55 Goal status: IN PROGRESS  4.  Patient will be able to stand on each leg SLS x 5" to demonstrate improved functional standing balance Baseline:  Goal status: IN PROGRESS    PLAN:  PT FREQUENCY: 1x/week  PT DURATION: 4 weeks  PLANNED INTERVENTIONS: Therapeutic exercises, Therapeutic activity, Neuromuscular re-education, Balance training, Gait training, Patient/Family education, Joint manipulation, Joint mobilization, Stair training, Orthotic/Fit training, DME instructions, Aquatic Therapy, Dry Needling, Electrical stimulation, Spinal manipulation, Spinal mobilization, Cryotherapy, Moist heat, Compression bandaging, scar mobilization, Splintting, Taping, Traction, Ultrasound, Ionotophoresis 4mg /ml Dexamethasone, and Manual therapy   PLAN FOR NEXT SESSION: balance; functional strength; right hip strengthening. Will attend one time a week due to copay so please progress HEP each visit.    Becky Sax, LPTA/CLT; CBIS [phone]  Juel Burrow, PTA 08/19/2023, 2:44 PM  2:44 PM, 08/19/23

## 2023-08-20 ENCOUNTER — Ambulatory Visit: Payer: Medicare HMO | Admitting: Internal Medicine

## 2023-08-20 ENCOUNTER — Encounter: Payer: Self-pay | Admitting: Internal Medicine

## 2023-08-20 VITALS — BP 169/94 | HR 76 | Ht 64.0 in | Wt 152.0 lb

## 2023-08-20 DIAGNOSIS — J45991 Cough variant asthma: Secondary | ICD-10-CM

## 2023-08-20 NOTE — Telephone Encounter (Signed)
Patient's options are switching to warfarin or applying for Eliquis patient assistance which can be found at:  [email or web address]

## 2023-08-20 NOTE — Telephone Encounter (Signed)
Called patient to advise of options of applying for assistance or switching medications. Stated that she will contact her insurance company to see what is formulary and whether they will cover Warfarin and call us back to let us know before sending over to provider.

## 2023-08-20 NOTE — Patient Instructions (Addendum)
Also  Ok to try albuterol  x 2 puffs x 15 min before an activity (on alternating days)  that you know would usually make you short of breath and see if it makes any difference and if makes none then don't take albuterol after activity unless you can't catch your breath as this means it's the resting that helps, not the albuterol.   Ok to try symbicort 80 Take 2 puffs first thing in am and  only if needed take another 2 puffs about 12 hours later.    Please schedule a follow up visit in 6 months but call sooner if needed

## 2023-08-20 NOTE — Assessment & Plan Note (Addendum)
Allergy profile 01/24/2017 >  Eos 0.3 /  IgE  702  Dog > cat,Trees/grass/ ragweed    - 01/24/2017  continue symb 80 2bid  - Sinus CT 01/24/2017 >>> No evidence of sinusitis.  - Spirometry 04/15/2017  FEV1 2.11 (100%)  Ratio 80 with nl contour in effort indep portion  - 08/01/2022  After extensive coaching inhaler device,  effectiveness =    90% increase symb 80 to 2 bid and max rx for gerd x 6 weeks then return to regroup - Allergy screen 08/01/2022 >  Eos 0.1 /  IgE  125 - 08/20/2023  After extensive coaching inhaler device,  effectiveness =    80% with hfa so rec continue symbicort 80 2bid and approp saba   She has AB but no evidence of significant copd so continue low dose Symbicort and saba:  Re SABA :  I spent extra time with pt today reviewing appropriate use of albuterol for prn use on exertion with the following points: 1) saba is for relief of sob that does not improve by walking a slower pace or resting but rather if the pt does not improve after trying this first. 2) If the pt is convinced, as many are, that saba helps recover from activity faster then it's easy to tell if this is the case by re-challenging : ie stop, take the inhaler, then p 5 minutes try the exact same activity (intensity of workload) that just caused the symptoms and see if they are substantially diminished or not after saba 3) if there is an activity that reproducibly causes the symptoms, try the saba 15 min before the activity on alternate days   If in fact the saba really does help, then fine to continue to use it prn but advised may need to look closer at the maintenance regimen being used to achieve better control of airways disease with exertion.   Despite component of chf/ All goals of chronic asthma control met including optimal function and elimination of symptoms with minimal need for rescue therapy.  Contingencies discussed in full including contacting this office immediately if not controlling the symptoms using  the rule of two's.     Her doe is not at all likely to be due to occult asthma so no change in maint rx suggested          Each maintenance medication was reviewed in detail including emphasizing most importantly the difference between maintenance and prns and under what circumstances the prns are to be triggered using an action plan format where appropriate.  Total time for H and P, chart review, counseling, reviewing hfa  device(s) and generating customized AVS unique to this office visit / same day charting  > 30 min

## 2023-08-21 NOTE — Telephone Encounter (Signed)
Called patient back regarding medication. She stated that she will remain on Eliquis for now doesn't want to have to have the [facility] checks every 2 weeks. Offered her the patient assistance form she stated that she will hold off until the first of the year. No further assistance needed.

## 2023-08-21 NOTE — Telephone Encounter (Signed)
Patient is requesting call back to discuss this medication again and is requesting call back.

## 2023-08-26 ENCOUNTER — Encounter (HOSPITAL_COMMUNITY): Payer: Medicare HMO

## 2023-09-01 ENCOUNTER — Telehealth: Payer: Self-pay | Admitting: Podiatry

## 2023-09-01 NOTE — Telephone Encounter (Signed)
Patient has an appointment on 10/4.  Her right toe bunion is hurting very bad.  Having a hard time walking.  No other appointments available.  She is asking if there is anything she can do to alleviate the pain until her appointment on Friday?  Please call.

## 2023-09-02 ENCOUNTER — Telehealth: Payer: Self-pay | Admitting: Internal Medicine

## 2023-09-02 ENCOUNTER — Encounter (HOSPITAL_COMMUNITY): Payer: Medicare HMO

## 2023-09-02 ENCOUNTER — Encounter (HOSPITAL_COMMUNITY): Payer: Self-pay

## 2023-09-02 MED ORDER — POTASSIUM CHLORIDE ER 10 MEQ PO TBCR: 20.0000 meq | EXTENDED_RELEASE_TABLET | Freq: Every day | ORAL | 3 refills | Status: DC

## 2023-09-02 NOTE — Therapy (Signed)
[facility] at [facility] [address] [place], [place], [address] Phone: [phone]   Fax:  [phone]  Patient Details  Name: [name] MRN: [id number] Date of Birth: [birth year] Referring Provider:  No ref. provider found  Encounter Date: 09/02/2023 PHYSICAL THERAPY DISCHARGE SUMMARY  Visits from Start of Care: 3  Current functional level related to goals / functional outcomes: unknown   Remaining deficits: umknown   Education / Equipment: HEP   Patient agrees to discharge. Patient goals were partially met. Patient is being discharged due to the patient's request. Wants to continue with HEP only.   7:50 AM, 09/02/23 Deserai Cansler Small Sharnika Binney MPT [facility] [place] [phone]  [facility] at [facility] [address] [place], [place], [address] Phone: [phone]   Fax:  [phone]

## 2023-09-02 NOTE — Telephone Encounter (Signed)
*  STAT* If patient is at the pharmacy, call can be transferred to refill team.   1. Which medications need to be refilled? (please list name of each medication and dose if known) potassium chloride (KLOR-CON) 10 MEQ tablet   2. Which pharmacy/location (including street and city if local pharmacy) is medication to be sent to? [facility] - [place], [place] - [address]   3. Do they need a 30 day or 90 day supply? 90

## 2023-09-02 NOTE — Telephone Encounter (Signed)
Medication refill request completed.  

## 2023-09-05 ENCOUNTER — Ambulatory Visit: Payer: Medicare HMO | Admitting: Podiatry

## 2023-09-05 DIAGNOSIS — L97512 Non-pressure chronic ulcer of other part of right foot with fat layer exposed: Secondary | ICD-10-CM

## 2023-09-05 NOTE — Patient Instructions (Signed)
Keep a small amount of antibiotic ointment, mupirocin ointment, on the ulcer daily. Monitor for any signs/symptoms of infection. Call the office immediately if any occur or go directly to the emergency room. Call with any questions/concerns.

## 2023-09-08 ENCOUNTER — Ambulatory Visit
Admission: EM | Admit: 2023-09-08 | Discharge: 2023-09-08 | Disposition: A | Discharge status: Home / Self Care | Payer: Medicare HMO | Attending: Nurse Practitioner | Admitting: Nurse Practitioner

## 2023-09-08 DIAGNOSIS — L03115 Cellulitis of right lower limb: Secondary | ICD-10-CM | POA: Diagnosis not present

## 2023-09-08 MED ORDER — DOXYCYCLINE HYCLATE 100 MG PO TABS: 100.0000 mg | ORAL_TABLET | Freq: Two times a day (BID) | ORAL | 0 refills | Status: DC

## 2023-09-08 NOTE — ED Provider Notes (Signed)
[facility]    CSN: [id number] Arrival date & time: 09/08/23  1704      History   Chief Complaint No chief complaint on file.   HPI Briany Aye Jahnke is a 82 y.o. female.   The history is provided by the patient.   Patient presents for complaints of pain in the right lower extremity along with redness and tenderness that started today.  Patient reports that she just realized that she also had redness and swelling to the area.  She states over the past several days, she did see her podiatrist who did shave down a callus on the right great toe.  Patient also reports history of swelling in her lower extremities, states that she normally wears TED hose, patient also has history of varicose veins.  Patient states that she has also experienced headache, chills, and fever over the past several days.  Patient states before she had the same or similar symptoms, and was diagnosed with cellulitis. Past Medical History:  Diagnosis Date   Anemia    Anxiety    Arthritis    Asthma    Atrial flutter (HCC)    Constipation    Dysrhythmia    GERD (gastroesophageal reflux disease)    High blood pressure    History of COVID-19 12/28/2020   Hypothyroidism    PAD (peripheral artery disease) (HCC)    bilateral legs   Peripheral edema    Peripheral neuropathy    bilateral feet at night occ   Psoriasis    Seasonal allergies    Skin cancer    Left eye brow   Varicose veins    Vertigo 11/23/2013    Patient Active Problem List   Diagnosis Date Noted   Chronic diastolic heart failure (HCC) 08/15/2023   Chest pain of uncertain etiology 05/19/2023   Aortic regurgitation 05/19/2023   Paroxysmal atrial flutter (HCC) 02/20/2023   Seasonal and perennial allergic rhinitis 10/16/2022   Allergic rhinitis 09/10/2022   External hemorrhoids 08/22/2022   Neuropathy 07/30/2022   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/2022   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   Osteoarthritis of right knee 02/21/2022    Increased frequency of urination 02/20/2022   Intermittent confusion 02/20/2022   Urinary incontinence 02/20/2022   Posterior rhinorrhea 01/04/2022   Fatigue 12/24/2021   Impairment of balance 09/20/2021   Chronic insomnia 06/20/2021   Chronic low back pain 06/20/2021   Edema of lower extremity 06/20/2021   Gastroesophageal reflux disease without esophagitis 06/20/2021   Hypothyroidism 06/20/2021   Major depressive disorder 06/20/2021   Osteopenia 06/20/2021   Mixed hyperlipidemia 06/15/2021   Type 2 diabetes mellitus (HCC) 06/15/2021   Gastritis and gastroduodenitis 06/14/2021   Mixed anxiety and depressive disorder 06/01/2021   Gastric ulcer 04/13/2021   Abdominal pain 03/15/2021   Fracture of femoral neck, right (HCC) 01/31/2021   Closed displaced fracture of right femoral neck (HCC) 01/31/2021   Indigestion 11/02/2020   Change in stool caliber 11/02/2020   Loss of weight 08/23/2020   Nausea without vomiting 06/15/2020   Tick bite 04/10/2020   Medication refill 04/10/2020   Acquired trigger finger 03/28/2020   Pain in right knee 10/08/2019   Cellulitis, leg 08/10/2019   Cellulitis 08/08/2019   Fever 08/08/2019   Cellulitis of foot 08/08/2019   Hypokalemia 08/08/2019   Hyponatremia 08/08/2019   Essential hypertension 08/08/2019   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   Adverse food reaction 09/24/2018   Mild persistent asthma without complication 09/24/2018   Pain  of left hip joint 07/17/2018   DOE (dyspnea on exertion) 01/24/2017   Cough variant asthma  vs UACS 01/24/2017   High risk medication use 09/25/2016   Varicose veins of bilateral lower extremities with other complications 03/07/2014   Varicose veins 02/15/2014   Vertigo 11/23/2013   Pain in limb 10/04/2013   Hip bursitis 02/11/2013   Constipation 02/16/2009   RECTAL BLEEDING 02/16/2009   History of cardiovascular disorder 01/29/2008    Past Surgical History:  Procedure Laterality Date    APPENDECTOMY     patient unsure   BIOPSY  12/21/2020   Procedure: BIOPSY;  Surgeon: Corbin Ade, MD;  Location: [facility];  Service: Endoscopy;;   BIOPSY  05/28/2021   Procedure: BIOPSY;  Surgeon: Lanelle Bal, DO;  Location: [facility];  Service: Endoscopy;;   CARDIOVERSION N/A 04/04/2023   Procedure: CARDIOVERSION;  Surgeon: Antoine Poche, MD;  Location: [facility];  Service: Endoscopy;  Laterality: N/A;   CATARACT EXTRACTION W/PHACO Left 01/27/2017   Procedure: CATARACT EXTRACTION PHACO AND INTRAOCULAR LENS PLACEMENT (IOC);  Surgeon: Gemma Payor, MD;  Location: [facility];  Service: Ophthalmology;  Laterality: Left;  CDE: 29.90   CATARACT EXTRACTION W/PHACO Right 02/24/2017   Procedure: CATARACT EXTRACTION PHACO AND INTRAOCULAR LENS PLACEMENT (IOC);  Surgeon: Gemma Payor, MD;  Location: [facility];  Service: Ophthalmology;  Laterality: Right;  CDE: 8.57   CHOLECYSTECTOMY     COLONOSCOPY N/A 10/01/2016   Procedure: COLONOSCOPY;  Surgeon: Corbin Ade, MD; pancolonic diverticulosis, melanosis coli, otherwise normal exam.  No recommendations to repeat colonoscopy.   COLONOSCOPY WITH PROPOFOL N/A 12/21/2020   Rourk: Diverticulosis   ESOPHAGOGASTRODUODENOSCOPY (EGD) WITH PROPOFOL N/A 12/21/2020   Rourk: Erosive reflux esophagitis, nonbleeding gastric ulcers with biopsy showing erosion/ulceration, negative for H. pylori.   ESOPHAGOGASTRODUODENOSCOPY (EGD) WITH PROPOFOL N/A 05/28/2021   Carver: gastritis (reactive gastropathy), no H.pylori   FLEXOR TENDON REPAIR Right 11/06/2017   Procedure: RIGHT WRIST FLEXOR TENDON REPAIRAND STT DEBRIEDMENT;  Surgeon: Betha Loa, MD;  Location: [facility];  Service: Orthopedics;  Laterality: Right;   MOUTH SURGERY     artery bleed   TOTAL HIP ARTHROPLASTY Right 01/31/2021   Procedure: TOTAL HIP ARTHROPLASTY ANTERIOR APPROACH;  Surgeon: Samson Frederic, MD;  Location: [facility];  Service: Orthopedics;  Laterality: Right;   TUBAL LIGATION       OB History     Gravida  2   Para  2   Term      Preterm      AB      Living  2      SAB      IAB      Ectopic      Multiple      Live Births  2            Home Medications    Prior to Admission medications   Medication Sig Start Date End Date Taking? Authorizing Provider  doxycycline (VIBRA-TABS) 100 MG tablet Take 1 tablet (100 mg total) by mouth 2 (two) times daily. 09/08/23  Yes Dellene Mcgroarty-Warren, Sadie Haber, NP  acetaminophen (TYLENOL) 500 MG tablet Take 500-1,000 mg by mouth as needed for mild pain or moderate pain.    [provider]  albuterol (VENTOLIN HFA) 108 (90 Base) MCG/ACT inhaler Inhale 2 puffs into the lungs every 6 (six) hours as needed for wheezing or shortness of breath. 06/16/23   Birder Robson, MD  ALPRAZolam Prudy Feeler) 0.5 MG tablet Take  0.5 mg by mouth in the morning, at noon, and at bedtime. (1000, 1500, AT BEDTIME)    [provider]  apixaban (ELIQUIS) 5 MG TABS tablet Take 1 tablet (5 mg total) by mouth 2 (two) times daily. 02/20/23   Mallipeddi, Vishnu P, MD  azelastine (ASTELIN) 0.1 % nasal spray Place 1 spray into both nostrils 2 (two) times daily. 06/16/23   Birder Robson, MD  budesonide-formoterol (SYMBICORT) 80-4.5 MCG/ACT inhaler Inhale 2 puffs into the lungs 2 (two) times daily. 06/16/23   Birder Robson, MD  clotrimazole-betamethasone (LOTRISONE) cream Apply 1 Application topically daily. 03/04/23   Vivi Barrack, DPM  diclofenac Sodium (VOLTAREN) 1 % GEL Apply 1 application topically as needed (back pain).    [provider]  docusate sodium (COLACE) 100 MG capsule Take 100 mg by mouth in the morning.    [provider]  ELDERBERRY PO Take 50 mg by mouth every evening.    [provider]  famotidine (PEPCID) 20 MG tablet TAKE 1 TABLET BY MOUTH DAILY AFTER SUPPER 07/31/23   Nyoka Cowden, MD  fluocinonide gel (LIDEX) 0.05 % Apply 1 Application topically 2 (two) times daily. 07/09/23    [provider]  fluticasone (FLONASE) 50 MCG/ACT nasal spray Place 2 sprays into both nostrils in the morning. 06/16/23   Birder Robson, MD  furosemide (LASIX) 40 MG tablet Take 1 tablet (40 mg total) by mouth daily. May take an additional tablet daily as needed if SOB continues 08/11/23   Mallipeddi, Vishnu P, MD  ipratropium (ATROVENT) 0.06 % nasal spray Place 2 sprays into both nostrils 4 (four) times daily as needed for rhinitis. 06/16/23   Birder Robson, MD  levothyroxine (SYNTHROID) 50 MCG tablet Take 1 tablet (50 mcg total) by mouth daily before breakfast. 03/26/23   Dani Gobble, NP  lidocaine (XYLOCAINE) 2 % solution Use as directed 5 mLs in the mouth or throat as needed for mouth pain. Gargle and spit 5mL up to three times daily as needed for throat pain. 09/28/22   Donnie Gedeon-Warren, Sadie Haber, NP  losartan (COZAAR) 50 MG tablet Take 50 mg by mouth 2 (two) times daily. 01/07/23   [provider]  Multiple Vitamin (MULTIVITAMIN WITH MINERALS) TABS tablet Take 1 tablet by mouth daily in the afternoon.    [provider]  mupirocin ointment (BACTROBAN) 2 % APPLY TO AFFECTED AREA DAILY 04/10/23   Vivi Barrack, DPM  NONFORMULARY OR COMPOUNDED ITEM Apply 1 Application topically 3 (three) times daily as needed (nerve pain.). Baclofen 2%/Diclofenac Sodium 3%/Gabapentin 5%/Lidocaine HCl 5%/Menthol 1% Topical Gel    [provider]  pantoprazole (PROTONIX) 40 MG tablet TAKE 1 TABLET BY MOUTH ONCE A DAY. 06/11/23   Tiffany Kocher, PA-C  Polyethyl Glycol-Propyl Glycol (LUBRICANT EYE DROPS) 0.4-0.3 % SOLN Place 1-2 drops into both eyes 3 (three) times daily as needed (dry/irritated eyes.).    [provider]  polyethylene glycol (MIRALAX / GLYCOLAX) 17 g packet Take 17 g by mouth every evening.    [provider]  potassium chloride (KLOR-CON) 10 MEQ tablet Take 2 tablets (20 mEq total) by mouth daily. 09/02/23   Mallipeddi, Vishnu P, MD  predniSONE  (DELTASONE) 10 MG tablet Take  4 each am x 2 days,   2 each am x 2 days,  1 each am x 2 days and stop 07/21/23   Nyoka Cowden, MD  raloxifene (EVISTA) 60 MG tablet Take 1 tablet (  60 mg total) by mouth every morning. Patient taking differently: Take 60 mg by mouth every other day. IN THE MORNING 04/10/20   Adline Potter, NP  venlafaxine XR (EFFEXOR-XR) 75 MG 24 hr capsule Take 75 mg by mouth daily after breakfast.    [provider]  vitamin D3 (CHOLECALCIFEROL) 25 MCG tablet Take 1,000 Units by mouth daily.    [provider]  ZINC CITRATE PO Take 50 mg by mouth every evening.    [provider]    Family History Family History  Problem Relation Age of Onset   Hypertension Mother    Varicose Veins Father    Heart attack Father    Heart disease Father    Hypertension Brother    COPD Brother     Social History Social History   Tobacco Use   Smoking status: Former    Current packs/day: 0.00    Average packs/day: 0.3 packs/day for 5.0 years (1.3 ttl pk-yrs)    Types: Cigarettes    Start date: 12/02/1992    Quit date: 12/02/1997    Years since quitting: 25.7    Passive exposure: Past   Smokeless tobacco: Never  Vaping Use   Vaping status: Never Used  Substance Use Topics   Alcohol use: Not Currently    Alcohol/week: 1.0 standard drink of alcohol    Types: 1 Glasses of wine per week   Drug use: No     Allergies   Amoxicillin-pot clavulanate, Ace inhibitors, Guaifenesin, Lisinopril, Cetirizine, Clarithromycin, Entex la, Gabapentin, Levofloxacin, and Meloxicam   Review of Systems Review of Systems Per HPI  Physical Exam Triage Vital Signs ED Triage Vitals [09/08/23 1718]  Encounter Vitals Group     BP (!) 146/89     Systolic BP Percentile      Diastolic BP Percentile      Pulse Rate 88     Resp 18     Temp 97.7 F (36.5 C)     Temp Source Oral     SpO2 95 %     Weight      Height      Head Circumference      Peak Flow      Pain  Score 10     Pain Loc      Pain Education      Exclude from Growth Chart    No data found.  Updated Vital Signs BP (!) 146/89 (BP Location: Right Arm)   Pulse 88   Temp 97.7 F (36.5 C) (Oral)   Resp 18   SpO2 95%   Visual Acuity Right Eye Distance:   Left Eye Distance:   Bilateral Distance:    Right Eye Near:   Left Eye Near:    Bilateral Near:     Physical Exam Vitals and nursing note reviewed.  Constitutional:      General: She is not in acute distress.    Appearance: Normal appearance.  HENT:     Head: Normocephalic.  Eyes:     Extraocular Movements: Extraocular movements intact.     Conjunctiva/sclera: Conjunctivae normal.     Pupils: Pupils are equal, round, and reactive to light.  Cardiovascular:     Rate and Rhythm: Normal rate and regular rhythm.     Pulses: Normal pulses.     Heart sounds: Normal heart sounds.  Pulmonary:     Effort: Pulmonary effort is normal. No respiratory distress.     Breath sounds: Normal breath sounds. No  stridor. No wheezing, rhonchi or rales.  Abdominal:     General: Bowel sounds are normal.     Palpations: Abdomen is soft.     Tenderness: There is no abdominal tenderness.  Musculoskeletal:     Cervical back: Normal range of motion.     Right lower leg: Swelling and tenderness present.     Comments: RLE, warm to palpation  Lymphadenopathy:     Cervical: No cervical adenopathy.  Skin:    General: Skin is warm and dry.  Neurological:     General: No focal deficit present.     Mental Status: She is alert and oriented to person, place, and time.  Psychiatric:        Mood and Affect: Mood normal.        Behavior: Behavior normal.      UC Treatments / Results  Labs (all labs ordered are listed, but only abnormal results are displayed) Labs Reviewed - No data to display  EKG   Radiology No results found.  Procedures Procedures (including critical care time)  Medications Ordered in UC Medications - No data to  display  Initial Impression / Assessment and Plan / UC Course  I have reviewed the triage vital signs and the nursing notes.  Pertinent labs & imaging results that were available during my care of the patient were reviewed by me and considered in my medical decision making (see chart for details).  The patient is well-appearing, she is in no acute distress, vital signs are stable.  Patient with redness and swelling of the right lower extremity, area of the lower right lower leg is warm to palpation.  Will treat empirically for cellulitis with doxycycline 100 mg.  Supportive care recommendations were provided and discussed with the patient to include continuing over-the-counter Tylenol, elevation of the lower extremities, and to monitor symptoms for worsening.  Patient was given strict ER follow-up precautions.  Patient was advised to follow-up with her primary care physician and if symptoms fail to improve.  Patient is in agreement with this plan of care and verbalizes understanding.  All questions were answered.  Patient stable for discharge.   Final Clinical Impressions(s) / UC Diagnoses   Final diagnoses:  Cellulitis of right lower extremity     Discharge Instructions      Take medication as prescribed. Continue over-the-counter Tylenol as needed for pain, fever, or general discomfort. Elevate the lower extremities above the level of the heart to help decrease pain. May apply ice to the right leg to help with pain or discomfort. Monitor the right lower leg for worsening.  If you noticed increased redness that goes up or down the leg, increased swelling, continue to have fevers, or other concerns, please go to the emergency department immediately for further evaluation. Follow-up as needed.     ED Prescriptions     Medication Sig Dispense Auth. Provider   doxycycline (VIBRA-TABS) 100 MG tablet Take 1 tablet (100 mg total) by mouth 2 (two) times daily. 14 tablet Michial Disney-Warren,  Sadie Haber, NP      PDMP not reviewed this encounter.   Abran Cantor, NP 09/09/23 1258

## 2023-09-08 NOTE — Discharge Instructions (Signed)
Take medication as prescribed. Continue over-the-counter Tylenol as needed for pain, fever, or general discomfort. Elevate the lower extremities above the level of the heart to help decrease pain. May apply ice to the right leg to help with pain or discomfort. Monitor the right lower leg for worsening.  If you noticed increased redness that goes up or down the leg, increased swelling, continue to have fevers, or other concerns, please go to the emergency department immediately for further evaluation. Follow-up as needed.

## 2023-09-08 NOTE — ED Triage Notes (Signed)
Pt c/o toe problem, swelling from the toe up the leg , chills and fever. Pt believe there are all related because 6 years ago she had the same symptoms and was told it was cellulitis

## 2023-09-09 NOTE — Progress Notes (Signed)
Subjective: No chief complaint on file.   82 year old female presents today for concerns of pain along the area of a "bunion" on the right big toe.  The area is sore.  Denies any drainage or pus.  No swelling or redness.  No fevers or chills.  No other concerns.   Objective: AAO x3, NAD DP/PT pulses palpable bilaterally, CRT less than 3 seconds On the plantar aspect the right hallux of the hyperkeratotic lesion with dried blood.  After debridement there is a granular ulceration measuring 0.3 x 0.3 x 0.2 cm.  Prior to debridement not able to measure the ulceration given the callus formation.  There is no surrounding erythema, ascending cellulitis.  There is no fluctuation or crepitation there is no malodor.  No probing to bone, undermining or tunneling. No pain with calf compression, swelling, warmth, erythema  Assessment: Preulcerative callus right foot; hallux valgus left hallux with swelling  Plan: -All treatment options discussed with the patient including all alternatives, risks, complications.  -Medically necessary wound debridements performed.  Sharply debrided hyperkeratotic lesion revealed underlying ulceration with a #312 with scalpel.  Debrided all hyperkeratotic, nonviable tissue to help limit wound healing.  Not able to measure the wound prior to debridement.  Post measurements noted above.  Recommend mupirocin ointment dressing twice daily.  Offloading. -Monitor for any clinical signs or symptoms of infection and directed to call the office immediately should any occur or go to the ER.  Return in about 3 weeks (around 09/26/2023) for toe ulcer.  Vivi Barrack DPM

## 2023-09-10 DIAGNOSIS — E039 Hypothyroidism, unspecified: Secondary | ICD-10-CM | POA: Diagnosis not present

## 2023-09-11 DIAGNOSIS — L03115 Cellulitis of right lower limb: Secondary | ICD-10-CM | POA: Diagnosis not present

## 2023-09-11 DIAGNOSIS — L03119 Cellulitis of unspecified part of limb: Secondary | ICD-10-CM | POA: Diagnosis not present

## 2023-09-11 LAB — TSH: TSH: 5.8 u[IU]/mL — ABNORMAL HIGH (ref 0.450–4.500)

## 2023-09-11 LAB — T4, FREE: Free T4: 1.29 ng/dL (ref 0.82–1.77)

## 2023-09-14 ENCOUNTER — Encounter: Payer: Self-pay | Admitting: Podiatry

## 2023-09-15 DIAGNOSIS — E039 Hypothyroidism, unspecified: Secondary | ICD-10-CM | POA: Diagnosis not present

## 2023-09-15 DIAGNOSIS — E119 Type 2 diabetes mellitus without complications: Secondary | ICD-10-CM | POA: Diagnosis not present

## 2023-09-15 DIAGNOSIS — I10 Essential (primary) hypertension: Secondary | ICD-10-CM | POA: Diagnosis not present

## 2023-09-15 DIAGNOSIS — R2689 Other abnormalities of gait and mobility: Secondary | ICD-10-CM | POA: Diagnosis not present

## 2023-09-15 NOTE — Telephone Encounter (Signed)
Can you schedule her this week?

## 2023-09-17 ENCOUNTER — Telehealth: Payer: Self-pay | Admitting: *Deleted

## 2023-09-17 NOTE — Telephone Encounter (Signed)
Patient left a voicemail. Shr states that she has not slept well the last month.. She had her lab work last week for her upcoming office visit on 09/23/23.  She has ask if she could be seen today and if not would Whitney look at the labs and see if there is anything that shows up that would be the reason that she is not sleeping well.She also states that she is having bad headaches.

## 2023-09-17 NOTE — Telephone Encounter (Signed)
I looked at her thyroid labs, there is nothing there to explain her insomnia.  Her TSH is slightly elevated which leans more towards the underactive side (almost like she could use a dosage adjustment for her Levothyroxine), but the Free T4 is in the middle of the normal range.  I recommend she reach out to her PCP for advice and further evaluation.

## 2023-09-18 ENCOUNTER — Ambulatory Visit: Payer: Medicare HMO | Admitting: Podiatry

## 2023-09-18 ENCOUNTER — Encounter: Payer: Self-pay | Admitting: Podiatry

## 2023-09-18 ENCOUNTER — Ambulatory Visit: Payer: Medicare HMO

## 2023-09-18 DIAGNOSIS — L97512 Non-pressure chronic ulcer of other part of right foot with fat layer exposed: Secondary | ICD-10-CM

## 2023-09-18 DIAGNOSIS — M2031 Hallux varus (acquired), right foot: Secondary | ICD-10-CM

## 2023-09-18 DIAGNOSIS — L03115 Cellulitis of right lower limb: Secondary | ICD-10-CM

## 2023-09-18 NOTE — Progress Notes (Signed)
Subjective: No chief complaint on file.   82 year old female presents the office today for follow-up evaluation of ulceration to the big toe.  She states saw her last she was diagnosed with cellulitis that she is getting some swelling and redness to her right leg.  This is much improved. She states it is still a little "pink" but much better.  She denies any drainage or pus.  She does not Dors any fevers or chills.  She has no other concerns today.   Objective: AAO x3, NAD DP/PT pulses palpable bilaterally, CRT less than 3 seconds On the plantar aspect the right hallux of the hyperkeratotic lesion with dried blood.  After debridement there is a granular ulceration measuring 0.2 x 0.2 x 0.2 cm.  Prior to debridement not able to measure the ulceration given the callus formation.  The wound base is 100% granular.  There is no probing, undermining or tunneling.  There is no surrounding erythema, ascending cellulitis.  There is no fluctuation or crepitation.  There is no malodor. There is a slight "pink" discoloration to the leg but there is no increased temperature.  There is no fluctuation or crepitation.  There is no malodor. No pain with calf compression, swelling, warmth, erythema  Assessment: Preulcerative callus right foot; hallux valgus left hallux with swelling  Plan: -All treatment options discussed with the patient including all alternatives, risks, complications.  -X-rays obtained reviewed.  3 views of the foot were obtained.  No definitive cortical changes suggest osteomyelitis.  No soft tissue emphysema.  Bunion present. -Medically necessary wound debridements performed.  Sharply debrided hyperkeratotic lesion revealed underlying ulceration with a #312 with scalpel.  Debrided all hyperkeratotic, nonviable tissue to help limit wound healing.  Not able to measure the wound prior to debridement.  Post measurements noted above.  Recommend mupirocin ointment dressing twice daily.   Offloading. -No current signs of cellulitis but she is to monitor closely and should any symptoms recur or worsen to let me know immediately, or go to the ER.  We discussed continue antibiotics as above initially agreed to hold off on further antibiotics at this time. -Monitor for any clinical signs or symptoms of infection and directed to call the office immediately should any occur or go to the ER.  No follow-ups on file.  Vivi Barrack DPM

## 2023-09-18 NOTE — Telephone Encounter (Signed)
Patient was called and made aware.She will be seeing her PCP tomorrow.

## 2023-09-19 DIAGNOSIS — L282 Other prurigo: Secondary | ICD-10-CM | POA: Diagnosis not present

## 2023-09-19 DIAGNOSIS — I10 Essential (primary) hypertension: Secondary | ICD-10-CM | POA: Diagnosis not present

## 2023-09-19 DIAGNOSIS — I4892 Unspecified atrial flutter: Secondary | ICD-10-CM | POA: Diagnosis not present

## 2023-09-19 DIAGNOSIS — E1140 Type 2 diabetes mellitus with diabetic neuropathy, unspecified: Secondary | ICD-10-CM | POA: Diagnosis not present

## 2023-09-19 DIAGNOSIS — Z23 Encounter for immunization: Secondary | ICD-10-CM | POA: Diagnosis not present

## 2023-09-19 DIAGNOSIS — G629 Polyneuropathy, unspecified: Secondary | ICD-10-CM | POA: Diagnosis not present

## 2023-09-19 DIAGNOSIS — D6869 Other thrombophilia: Secondary | ICD-10-CM | POA: Diagnosis not present

## 2023-09-19 DIAGNOSIS — L2989 Other pruritus: Secondary | ICD-10-CM | POA: Diagnosis not present

## 2023-09-19 DIAGNOSIS — R600 Localized edema: Secondary | ICD-10-CM | POA: Diagnosis not present

## 2023-09-23 ENCOUNTER — Ambulatory Visit: Payer: Medicare HMO | Admitting: Nurse Practitioner

## 2023-09-23 ENCOUNTER — Encounter: Payer: Self-pay | Admitting: Nurse Practitioner

## 2023-09-23 VITALS — BP 133/89 | HR 82 | Ht 64.0 in | Wt 153.2 lb

## 2023-09-23 DIAGNOSIS — E039 Hypothyroidism, unspecified: Secondary | ICD-10-CM

## 2023-09-23 NOTE — Patient Instructions (Signed)

## 2023-09-23 NOTE — Progress Notes (Signed)
Endocrinology Follow Up Visit                                        09/23/2023, 11:28 AM     SUBJECTIVE:  [name] is a 81 y.o.-year-old female patient being seen in follow up after being seen in consultation for hypothyroidism referred by Benita Stabile, MD.   Past Medical History:  Diagnosis Date   Anemia    Anxiety    Arthritis    Asthma    Atrial flutter (HCC)    Constipation    Dysrhythmia    GERD (gastroesophageal reflux disease)    High blood pressure    History of COVID-19 12/28/2020   Hypothyroidism    PAD (peripheral artery disease) (HCC)    bilateral legs   Peripheral edema    Peripheral neuropathy    bilateral feet at night occ   Psoriasis    Seasonal allergies    Skin cancer    Left eye brow   Varicose veins    Vertigo 11/23/2013    Past Surgical History:  Procedure Laterality Date   APPENDECTOMY     patient unsure   BIOPSY  12/21/2020   Procedure: BIOPSY;  Surgeon: Corbin Ade, MD;  [facility]: [facility];  Service: Endoscopy;;   BIOPSY  05/28/2021   Procedure: BIOPSY;  Surgeon: Lanelle Bal, DO;  Location: [facility];  Service: Endoscopy;;   CARDIOVERSION N/A 04/04/2023   Procedure: CARDIOVERSION;  Surgeon: Antoine Poche, MD;  Location: [facility];  Service: Endoscopy;  Laterality: N/A;   CATARACT EXTRACTION W/PHACO Left 01/27/2017   Procedure: CATARACT EXTRACTION PHACO AND INTRAOCULAR LENS PLACEMENT (IOC);  Surgeon: Gemma Payor, MD;  Location: [facility];  Service: Ophthalmology;  Laterality: Left;  CDE: 29.90   CATARACT EXTRACTION W/PHACO Right 02/24/2017   Procedure: CATARACT EXTRACTION PHACO AND INTRAOCULAR LENS PLACEMENT (IOC);  Surgeon: Gemma Payor, MD;  Location: [facility];  Service: Ophthalmology;  Laterality: Right;  CDE: 8.57   CHOLECYSTECTOMY     COLONOSCOPY N/A 10/01/2016   Procedure: COLONOSCOPY;  Surgeon: Corbin Ade, MD; pancolonic diverticulosis, melanosis  coli, otherwise normal exam.  No recommendations to repeat colonoscopy.   COLONOSCOPY WITH PROPOFOL N/A 12/21/2020   Rourk: Diverticulosis   ESOPHAGOGASTRODUODENOSCOPY (EGD) WITH PROPOFOL N/A 12/21/2020   Rourk: Erosive reflux esophagitis, nonbleeding gastric ulcers with biopsy showing erosion/ulceration, negative for H. pylori.   ESOPHAGOGASTRODUODENOSCOPY (EGD) WITH PROPOFOL N/A 05/28/2021   Carver: gastritis (reactive gastropathy), no H.pylori   FLEXOR TENDON REPAIR Right 11/06/2017   Procedure: RIGHT WRIST FLEXOR TENDON REPAIRAND STT DEBRIEDMENT;  Surgeon: Betha Loa, MD;  Location: [facility];  Service: Orthopedics;  Laterality: Right;   MOUTH SURGERY     artery bleed   TOTAL HIP ARTHROPLASTY Right 01/31/2021   Procedure: TOTAL HIP ARTHROPLASTY ANTERIOR APPROACH;  Surgeon: Samson Frederic, MD;  Location: [facility];  Service: Orthopedics;  Laterality: Right;   TUBAL LIGATION      Social History   Socioeconomic History   Marital status: Married  Spouse name: Not on file   Number of children: Not on file   Years of education: Not on file   Highest education level: Not on file  Occupational History   Not on file  Tobacco Use   Smoking status: Former    Current packs/day: 0.00    Average packs/day: 0.3 packs/day for 5.0 years (1.3 ttl pk-yrs)    Types: Cigarettes    Start date: 12/02/1992    Quit date: 12/02/1997    Years since quitting: 25.8    Passive exposure: Past   Smokeless tobacco: Never  Vaping Use   Vaping status: Never Used  Substance and Sexual Activity   Alcohol use: Not Currently    Alcohol/week: 1.0 standard drink of alcohol    Types: 1 Glasses of wine per week   Drug use: No   Sexual activity: Not Currently    Birth control/protection: Surgical    Comment: tubal  Other Topics Concern   Not on file  Social History Narrative   Not on file   Social Determinants of Health   Financial Resource Strain: Low Risk  (04/10/2020)   Overall  Financial Resource Strain (CARDIA)    Difficulty of Paying Living Expenses: Not hard at all  Food Insecurity: No Food Insecurity (05/31/2021)   Hunger Vital Sign    Worried About Running Out of Food in the Last Year: Never true    Ran Out of Food in the Last Year: Never true  Transportation Needs: No Transportation Needs (02/19/2022)   PRAPARE - Administrator, Civil Service (Medical): No    Lack of Transportation (Non-Medical): No  Physical Activity: Inactive (04/10/2020)   Exercise Vital Sign    Days of Exercise per Week: 0 days    Minutes of Exercise per Session: 0 min  Stress: Stress Concern Present (04/10/2020)   [employer] of Occupational Health - Occupational Stress Questionnaire    Feeling of Stress : To some extent  Social Connections: Unknown (05/16/2022)   [facility]   Social Network    Social Network: Not on file    Family History  Problem Relation Age of Onset   Hypertension Mother    Varicose Veins Father    Heart attack Father    Heart disease Father    Hypertension Brother    COPD Brother     Outpatient Encounter Medications as of 09/23/2023  Medication Sig   acetaminophen (TYLENOL) 500 MG tablet Take 500-1,000 mg by mouth as needed for mild pain or moderate pain.   albuterol (VENTOLIN HFA) 108 (90 Base) MCG/ACT inhaler Inhale 2 puffs into the lungs every 6 (six) hours as needed for wheezing or shortness of breath.   ALPRAZolam (XANAX) 0.5 MG tablet Take 0.5 mg by mouth in the morning, at noon, and at bedtime. (1000, 1500, AT BEDTIME)   apixaban (ELIQUIS) 5 MG TABS tablet Take 1 tablet (5 mg total) by mouth 2 (two) times daily.   azelastine (ASTELIN) 0.1 % nasal spray Place 1 spray into both nostrils 2 (two) times daily.   budesonide-formoterol (SYMBICORT) 80-4.5 MCG/ACT inhaler Inhale 2 puffs into the lungs 2 (two) times daily.   clotrimazole-betamethasone (LOTRISONE) cream Apply 1 Application topically daily.    diclofenac Sodium (VOLTAREN) 1 % GEL Apply 1 application topically as needed (back pain).   docusate sodium (COLACE) 100 MG capsule Take 100 mg by mouth in the morning.   famotidine (PEPCID) 20 MG tablet TAKE 1 TABLET BY  MOUTH DAILY AFTER SUPPER   fluocinonide gel (LIDEX) 0.05 % Apply 1 Application topically 2 (two) times daily.   fluticasone (FLONASE) 50 MCG/ACT nasal spray Place 2 sprays into both nostrils in the morning.   furosemide (LASIX) 40 MG tablet Take 1 tablet (40 mg total) by mouth daily. May take an additional tablet daily as needed if SOB continues   ipratropium (ATROVENT) 0.06 % nasal spray Place 2 sprays into both nostrils 4 (four) times daily as needed for rhinitis.   levothyroxine (SYNTHROID) 50 MCG tablet Take 1 tablet (50 mcg total) by mouth daily before breakfast.   lidocaine (XYLOCAINE) 2 % solution Use as directed 5 mLs in the mouth or throat as needed for mouth pain. Gargle and spit 5mL up to three times daily as needed for throat pain.   losartan (COZAAR) 50 MG tablet Take 50 mg by mouth 2 (two) times daily.   Multiple Vitamin (MULTIVITAMIN WITH MINERALS) TABS tablet Take 1 tablet by mouth daily in the afternoon.   mupirocin ointment (BACTROBAN) 2 % APPLY TO AFFECTED AREA DAILY   NONFORMULARY OR COMPOUNDED ITEM Apply 1 Application topically 3 (three) times daily as needed (nerve pain.). Baclofen 2%/Diclofenac Sodium 3%/Gabapentin 5%/Lidocaine HCl 5%/Menthol 1% Topical Gel   pantoprazole (PROTONIX) 40 MG tablet TAKE 1 TABLET BY MOUTH ONCE A DAY.   Polyethyl Glycol-Propyl Glycol (LUBRICANT EYE DROPS) 0.4-0.3 % SOLN Place 1-2 drops into both eyes 3 (three) times daily as needed (dry/irritated eyes.).   polyethylene glycol (MIRALAX / GLYCOLAX) 17 g packet Take 17 g by mouth every evening.   potassium chloride (KLOR-CON) 10 MEQ tablet Take 2 tablets (20 mEq total) by mouth daily.   raloxifene (EVISTA) 60 MG tablet Take 1 tablet (60 mg total) by mouth every morning. (Patient  taking differently: Take 60 mg by mouth every other day. IN THE MORNING)   venlafaxine XR (EFFEXOR-XR) 75 MG 24 hr capsule Take 75 mg by mouth daily after breakfast.   vitamin D3 (CHOLECALCIFEROL) 25 MCG tablet Take 1,000 Units by mouth daily.   ZINC CITRATE PO Take 50 mg by mouth every evening.   [DISCONTINUED] doxycycline (VIBRA-TABS) 100 MG tablet Take 1 tablet (100 mg total) by mouth 2 (two) times daily. (Patient not taking: Reported on 09/23/2023)   [DISCONTINUED] ELDERBERRY PO Take 50 mg by mouth every evening. (Patient not taking: Reported on 09/23/2023)   [DISCONTINUED] predniSONE (DELTASONE) 10 MG tablet Take  4 each am x 2 days,   2 each am x 2 days,  1 each am x 2 days and stop (Patient not taking: Reported on 09/23/2023)   No facility-administered encounter medications on file as of 09/23/2023.    ALLERGIES: Allergies  Allergen Reactions   Amoxicillin-Pot Clavulanate Nausea And Vomiting, Other (See Comments), Itching and Nausea Only    Has patient had a PCN reaction causing immediate rash, facial/tongue/throat swelling, SOB or lightheadedness with hypotension: No Has patient had a PCN reaction causing severe rash involving mucus membranes or skin necrosis: No Has patient had a PCN reaction that required hospitalization: No Has patient had a PCN reaction occurring within the last 10 years: No If all of the above answers are "NO", then may proceed with Cephalosporin use. Has patient had a PCN reaction causing immediate rash, facial/tongue/throat swelling, SOB or lightheadedness with hypotension: No Has patient had a PCN reaction causing severe rash involving mucus membranes or skin necrosis: No Has patient had a PCN reaction that required hospitalization: No Has patient had a PCN  reaction occurring within the last 10 years: No If all of the above answers are "NO", then may proceed with Cephalosporin use.    Ace Inhibitors Cough   Guaifenesin Hives and Other (See Comments)    Lisinopril Cough and Other (See Comments)   Cetirizine Other (See Comments)    Unknown reaction   Clarithromycin     Made her feel crazy.    Entex La Itching   Gabapentin Other (See Comments)    Foggy head   Levofloxacin Hives and Itching   Meloxicam Other (See Comments)    (MOBIC) Unknown reaction type (maybe nausea?)   VACCINATION STATUS: Immunization History  Administered Date(s) Administered   Influenza Whole 09/01/2016   Influenza, High Dose Seasonal PF 09/11/2018, 11/02/2019, 09/20/2021, 08/07/2022   Influenza-Unspecified 08/02/2013, 08/29/2015   PFIZER(Purple Top)SARS-COV-2 Vaccination 12/16/2019, 01/17/2020, 09/28/2020   Pneumococcal Conjugate-13 08/31/2019   Tdap 10/13/2022   Zoster Recombinant(Shingrix) 08/31/2019     HPI  Thyroid Problem Presents for follow-up visit. Symptoms include anxiety. Patient reports no cold intolerance, constipation, depressed mood, fatigue, heat intolerance, palpitations, tremors, weight gain or weight loss. The symptoms have been stable.    [name]  is a patient with the above medical history.  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    I reviewed patient's thyroid tests:  Lab Results  Component Value Date   TSH 5.800 (H) 09/10/2023   TSH 2.300 03/18/2023   TSH 3.140 09/16/2022   TSH 1.740 03/18/2022   TSH 2.670 09/19/2021   TSH 2.040 05/14/2021   TSH 6.350 (H) 02/08/2021   TSH 2.39 09/28/2020   TSH 0.60 07/25/2020   FREET4 1.29 09/10/2023   FREET4 1.09 03/18/2023   FREET4 1.23 09/16/2022   FREET4 1.38 03/18/2022   FREET4 1.09 09/19/2021   FREET4 1.36 05/14/2021   FREET4 1.21 02/08/2021   FREET4 1.4 09/28/2020     Pt denies feeling nodules in neck, hoarseness, dysphagia/odynophagia, SOB with lying down.  she has No history of radiation therapy to head or neck.  No recent use of iodine supplements.  I reviewed her chart and she also has a history of GERD with ulcers,  depression/anxiety, anemia, asthma, and high blood pressure.   Review of systems  Constitutional: + slowly increasing body weight,  current Body mass index is 26.3 kg/m. , + fatigue-stable, no subjective hyperthermia, no subjective hypothermia Eyes: no blurry vision, no xerophthalmia ENT: no sore throat, no nodules palpated in throat, no dysphagia/odynophagia, no hoarseness Cardiovascular: no chest pain, no shortness of breath, no palpitations, no leg swelling Respiratory: no cough, no shortness of breath Gastrointestinal: no nausea/vomiting/diarrhea Musculoskeletal: no muscle/joint aches Skin: no rashes, no hyperemia Neurological: no tremors, no numbness, no tingling, no dizziness Psychiatric: no depression, no anxiety, has significant amount of stress caring for her husband with Alzheimers  ------------------------------------------------------------------------------------------------------------------ OBJECTIVE:  BP 133/89 (BP Location: Left Arm, Patient Position: Sitting, Cuff Size: Large)   Pulse 82   Ht 5\' 4"  (1.626 m)   Wt 153 lb 3.2 oz (69.5 kg)   BMI 26.30 kg/m  Wt Readings from Last 3 Encounters:  09/23/23 153 lb 3.2 oz (69.5 kg)  08/20/23 152 lb (68.9 kg)  08/11/23 151 lb 3.2 oz (68.6 kg)   BP Readings from Last 3 Encounters:  09/23/23 133/89  09/08/23 (!) 146/89  08/20/23 (!) 169/94     Physical Exam- Limited  Constitutional:  Body mass index is 26.3 kg/m. , not in acute distress, normal  state of mind Eyes:  EOMI, no exophthalmos Musculoskeletal: no gross deformities, strength intact in all four extremities, no gross restriction of joint movements Skin:  no rashes, no hyperemia Neurological: no tremor with outstretched hands   CMP ( most recent) CMP     Component Value Date/Time   NA 134 (L) 08/05/2023 1043   NA 141 09/24/2018 1204   K 3.5 08/05/2023 1043   CL 99 08/05/2023 1043   CO2 26 08/05/2023 1043   GLUCOSE 106 (H) 08/05/2023 1043   BUN 16  08/05/2023 1043   BUN 13 09/24/2018 1204   CREATININE 1.04 (H) 08/05/2023 1043   CREATININE 0.94 (H) 01/22/2021 1419   CALCIUM 8.4 (L) 08/05/2023 1043   PROT 7.2 02/20/2023 1337   PROT 6.5 09/24/2018 1204   ALBUMIN 4.1 02/20/2023 1337   ALBUMIN 4.3 09/24/2018 1204   AST 24 02/20/2023 1337   ALT 18 02/20/2023 1337   ALKPHOS 74 02/20/2023 1337   BILITOT 0.8 02/20/2023 1337   BILITOT 0.4 09/24/2018 1204   GFRNONAA 54 (L) 08/05/2023 1043   GFRAA >60 05/08/2020 1232     Diabetic Labs (most recent): No results found for: "HGBA1C", "MICROALBUR"   Lipid Panel ( most recent) Lipid Panel     Component Value Date/Time   TRIG 92 09/11/2020 1547       Lab Results  Component Value Date   TSH 5.800 (H) 09/10/2023   TSH 2.300 03/18/2023   TSH 3.140 09/16/2022   TSH 1.740 03/18/2022   TSH 2.670 09/19/2021   TSH 2.040 05/14/2021   TSH 6.350 (H) 02/08/2021   TSH 2.39 09/28/2020   TSH 0.60 07/25/2020   FREET4 1.29 09/10/2023   FREET4 1.09 03/18/2023   FREET4 1.23 09/16/2022   FREET4 1.38 03/18/2022   FREET4 1.09 09/19/2021   FREET4 1.36 05/14/2021   FREET4 1.21 02/08/2021   FREET4 1.4 09/28/2020      Latest Reference Range & Units 05/14/21 12:22 09/19/21 14:46 03/18/22 13:52 09/16/22 15:20 03/18/23 11:01 09/10/23 12:11  TSH 0.450 - 4.500 uIU/mL 2.040 2.670 1.740 3.140 2.300 5.800 (H)  T4,Free(Direct) 0.82 - 1.77 ng/dL 8.41 3.24 4.01 0.27 2.53 1.29  (H): Data is abnormally high  --------------------------------------------------------------------------------------------------------------------   ASSESSMENT / PLAN: 1. Hypothyroidism- acquired   Patient with long-standing hypothyroidism, on levothyroxine therapy. On physical exam, patient does not have gross goiter, thyroid nodules, or neck compression symptoms.  Thyroid US WNL for age.  -Her previsit thyroid function tests are consistent with appropriate hormone replacement (TSH is not at goal but Free T4 is WNL).  For  safety purposes, no changes will be made to her medications today.  She is advised to continue Levothyroxine 50 mcg po daily before breakfast.   She called between visits for complaints of insomnia and headaches.  She was advised to check her thyroid labs sooner and based on the thyroid labs, it was not likely thyroid related.  She notes she was recently diagnosed with Afib, therefore changes will be made slowly to prevent thyroid contribution.  - We discussed about correct intake of levothyroxine, at fasting, with water, separated by at least 30 minutes from breakfast. -Patient is made aware of the fact that thyroid hormone replacement is needed for life, dose to be adjusted by periodic monitoring of thyroid function tests.    I spent  34  minutes in the care of the patient today including review of labs from Thyroid Function, CMP, and other relevant labs ; imaging/biopsy records (current and previous  including abstractions from other facilities); face-to-face time discussing  her lab results and symptoms, medications doses, her options of short and long term treatment based on the latest standards of care / guidelines;   and documenting the encounter.  Marita Kansas  participated in the discussions, expressed understanding, and voiced agreement with the above plans.  All questions were answered to her satisfaction. she is encouraged to contact clinic should she have any questions or concerns prior to her return visit.   Follow Up Plan: Return in about 3 months (around 12/24/2023) for Thyroid follow up, Previsit labs.    Ronny Bacon, [facility] [address] [place], [place] [address] Phone: [phone] Fax: [phone]  09/23/2023, 11:28 AM

## 2023-09-24 ENCOUNTER — Encounter: Payer: Self-pay | Admitting: Allergy & Immunology

## 2023-09-24 ENCOUNTER — Ambulatory Visit: Payer: Medicare HMO | Admitting: Allergy & Immunology

## 2023-09-24 VITALS — BP 122/68 | HR 100 | Temp 97.9°F | Resp 18 | Wt 154.4 lb

## 2023-09-24 DIAGNOSIS — J302 Other seasonal allergic rhinitis: Secondary | ICD-10-CM | POA: Diagnosis not present

## 2023-09-24 DIAGNOSIS — J4540 Moderate persistent asthma, uncomplicated: Secondary | ICD-10-CM

## 2023-09-24 DIAGNOSIS — B999 Unspecified infectious disease: Secondary | ICD-10-CM

## 2023-09-24 DIAGNOSIS — J3089 Other allergic rhinitis: Secondary | ICD-10-CM

## 2023-09-24 NOTE — Patient Instructions (Addendum)
1. Mild persistent asthma without complication - Lung testing looked amazing today. - We are not going to make any changes today, but do consider starting Tezspire injections.  - Continue with the Symbicort two puffs ONCE DAILY.  - I am going to talk to Dr. Jenene Slicker about the [place].   2. Seasonal and perennial allergic rhinitis - well controlled on the nose sprays only - Previous testing showed: grasses, indoor molds, outdoor molds, dust mites, and cockroach - Continue with:   Flonase (fluticasone) one spray per nostril one to two times daily Astelin (azelastine) 2 sprays per nostril one to two times daily   - IF NEEDED FOR RUNNY NOSE: Atrovent (ipratropium) 2 sprays in each nostril EVERY 8 hours as needed - Consider allergy shots as a means of long-term control. - Allergy shots "re-train" and "reset" the immune system to ignore environmental allergens and decrease the resulting immune response to those allergens (sneezing, itchy watery eyes, runny nose, nasal congestion, etc).    - Allergy shots improve symptoms in 75-85% of patients.   3. Recurrent infections  - Immune work up was completely normal.  - We do not need to do any further workup at this time.   4. Return in about 3 months (around 12/25/2023).    Please inform us of any Emergency Department visits, hospitalizations, or changes in symptoms. Call us before going to the ED for breathing or allergy symptoms since we might be able to fit you in for a sick visit. Feel free to contact us anytime with any questions, problems, or concerns.  It was a pleasure to see you today!   Websites that have reliable patient information: 1. American Academy of Asthma, Allergy, and Immunology: www.aaaai.org 2. Food Allergy Research and Education (FARE): foodallergy.org 3. Mothers of Asthmatics: http://www.asthmacommunitynetwork.org 4. American College of Allergy, Asthma, and Immunology: www.acaai.org   COVID-19 Vaccine Information can  be found at: [email or web address] For questions related to vaccine distribution or appointments, please email [email or web address] or call [phone].   We realize that you might be concerned about having an allergic reaction to the COVID19 vaccines. To help with that concern, WE ARE OFFERING THE COVID19 VACCINES IN OUR OFFICE! Ask the front desk for dates!     "Like" [place] on Facebook and Instagram for our latest updates!      A healthy democracy works best when [employer] participate! Make sure you are registered to vote! If you have moved or changed any of your contact information, you will need to get this updated before voting!  In some cases, you MAY be able to register to vote online: [email or web address]

## 2023-09-24 NOTE — Progress Notes (Signed)
FOLLOW UP  Date of Service/Encounter:  09/24/23   Assessment:   Mild persistent asthma without complication   Seasonal and perennial allergic rhinitis (grasses, indoor molds, outdoor molds, dust mites, and cockroach) - would likely benefit from allergen immunotherapy, although her duties at home taking care of her husband likely keep that from being an effective or easy treatment at this point in time   Remote history of smoking    Social stressors at home - husband with Alzheimers    Recent diagnosis of atrial fibrillation      Plan/Recommendations:   1. Mild persistent asthma without complication - Lung testing looked amazing today. - We are not going to make any changes today, but do consider starting Tezspire injections.  - Continue with the Symbicort two puffs ONCE DAILY.  - I am going to talk to Dr. Jenene Slicker about the [place].   2. Seasonal and perennial allergic rhinitis - well controlled on the nose sprays only - Previous testing showed: grasses, indoor molds, outdoor molds, dust mites, and cockroach - Continue with:   Flonase (fluticasone) one spray per nostril one to two times daily Astelin (azelastine) 2 sprays per nostril one to two times daily   - IF NEEDED FOR RUNNY NOSE: Atrovent (ipratropium) 2 sprays in each nostril EVERY 8 hours as needed - Consider allergy shots as a means of long-term control. - Allergy shots "re-train" and "reset" the immune system to ignore environmental allergens and decrease the resulting immune response to those allergens (sneezing, itchy watery eyes, runny nose, nasal congestion, etc).    - Allergy shots improve symptoms in 75-85% of patients.   3. Recurrent infections  - Immune work up was completely normal.  - We do not need to do any further workup at this time.   4. Return in about 3 months (around 12/25/2023).    Subjective:   [name] is a 82 y.o. female presenting today for follow up of  Chief Complaint   Patient presents with   Follow-up    [name] has a history of the following: Patient Active Problem List   Diagnosis Date Noted   Chronic diastolic heart failure (HCC) 08/15/2023   Chest pain of uncertain etiology 05/19/2023   Aortic regurgitation 05/19/2023   Paroxysmal atrial flutter (HCC) 02/20/2023   Seasonal and perennial allergic rhinitis 10/16/2022   Allergic rhinitis 09/10/2022   External hemorrhoids 08/22/2022   Neuropathy 07/30/2022   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/2022   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   Osteoarthritis of right knee 02/21/2022   Increased frequency of urination 02/20/2022   Intermittent confusion 02/20/2022   Urinary incontinence 02/20/2022   Posterior rhinorrhea 01/04/2022   Fatigue 12/24/2021   Impairment of balance 09/20/2021   Chronic insomnia 06/20/2021   Chronic low back pain 06/20/2021   Edema of lower extremity 06/20/2021   Gastroesophageal reflux disease without esophagitis 06/20/2021   Hypothyroidism 06/20/2021   Major depressive disorder 06/20/2021   Osteopenia 06/20/2021   Mixed hyperlipidemia 06/15/2021   Type 2 diabetes mellitus (HCC) 06/15/2021   Gastritis and gastroduodenitis 06/14/2021   Mixed anxiety and depressive disorder 06/01/2021   Gastric ulcer 04/13/2021   Abdominal pain 03/15/2021   Fracture of femoral neck, right (HCC) 01/31/2021   Closed displaced fracture of right femoral neck (HCC) 01/31/2021   Indigestion 11/02/2020   Change in stool caliber 11/02/2020   Loss of weight 08/23/2020   Nausea without vomiting 06/15/2020   Tick bite 04/10/2020   Medication  refill 04/10/2020   Acquired trigger finger 03/28/2020   Pain in right knee 10/08/2019   Cellulitis, leg 08/10/2019   Cellulitis 08/08/2019   Fever 08/08/2019   Cellulitis of foot 08/08/2019   Hypokalemia 08/08/2019   Hyponatremia 08/08/2019   Essential hypertension 08/08/2019   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   Adverse food reaction  09/24/2018   Mild persistent asthma without complication 09/24/2018   Pain of left hip joint 07/17/2018   DOE (dyspnea on exertion) 01/24/2017   Cough variant asthma  vs UACS 01/24/2017   High risk medication use 09/25/2016   Varicose veins of bilateral lower extremities with other complications 03/07/2014   Varicose veins 02/15/2014   Vertigo 11/23/2013   Pain in limb 10/04/2013   Hip bursitis 02/11/2013   Constipation 02/16/2009   RECTAL BLEEDING 02/16/2009   History of cardiovascular disorder 01/29/2008    History obtained from: chart review and patient.  Discussed the use of AI scribe software for clinical note transcription with the patient and/or guardian, who gave verbal consent to proceed.  [name] is a 82 y.o. female presenting for a follow up visit.  She was last seen in July 2024 by Dr. Allena Katz.  At that time, her cough was under fair control with Symbicort 80 mcg 2 puffs twice daily.  She had albuterol use during flares.  She continues to follow with Dr. Sherene Sires.  For her rhinitis, she was continued on Flonase as well as Astelin and ipratropium.  She also remained on Allegra.  We did talk about allergy shots.  She had a large immune workup which was normal.  She had protection to 17 out of 23 strains of Streptococcus pneumonia.   Since last visit, she has largely done well.   The patient, with a history of asthma, presents with complaints of dyspnea, particularly when walking. She reports that her breathing is not good, despite spirometry results indicating otherwise. She is currently on Symbicort, taking two puffs in the morning as per her doctor's advice. She also uses a nasal spray after taking Symbicort. Despite this, she still experiences difficulty in breathing when walking. She does think that she is limited in her activities of daily living due to her breathing difficulties.   In addition to asthma, the patient has a history of heart issues. She recently underwent a procedure  to correct an arrhythmia, which was successful on the first attempt. She is currently on Eliquis, a blood thinner, and has been prescribed 40mg  of diuretics to manage fluid around her heart. However, she reports that the increased dosage has not improved the fluid issue and has led to frequent urination.  The patient also reports chronic leg pain, from the knees down. She has a history of hip replacement surgery and has been managing a recurring cellulitis infection in her leg. She has been seeing a podiatrist for this issue, who has been shaving a callus and prescribing an antibiotic for a raw spot on her toe. The patient reports that the cellulitis infection flared up again recently, leading to difficulty walking.  The patient's overall health is further complicated by her caregiving responsibilities for her bedridden spouse. She reports that she is never alone for more than four hours, limiting her ability to attend to her own health needs.   Otherwise, there have been no changes to her past medical history, surgical history, family history, or social history.    Review of systems otherwise negative other than that mentioned in the HPI.  Objective:   Blood pressure 122/68, pulse 100, temperature 97.9 F (36.6 C), resp. rate 18, weight 154 lb 6 oz (70 kg), SpO2 97%. Body mass index is 26.5 kg/m.    Physical Exam Vitals reviewed.  Constitutional:      Appearance: Normal appearance. She is well-developed. She is not ill-appearing or toxic-appearing.     Comments: Lovely and talkative.   HENT:     Head: Normocephalic and atraumatic.     Right Ear: Tympanic membrane, ear canal and external ear normal.     Left Ear: Tympanic membrane, ear canal and external ear normal.     Nose: No nasal deformity, septal deviation, mucosal edema, congestion or rhinorrhea.     Right Turbinates: Enlarged, swollen and pale.     Left Turbinates: Enlarged, swollen and pale.     Right Sinus: No maxillary  sinus tenderness or frontal sinus tenderness.     Left Sinus: No maxillary sinus tenderness or frontal sinus tenderness.     Comments: No nasal polyps noted.     Mouth/Throat:     Lips: Pink.     Mouth: Mucous membranes are moist. Mucous membranes are not pale and not dry.     Pharynx: Uvula midline.  Eyes:     General: Lids are normal. No allergic shiner.       Right eye: No discharge.        Left eye: No discharge.     Conjunctiva/sclera: Conjunctivae normal.     Right eye: Right conjunctiva is not injected. No chemosis.    Left eye: Left conjunctiva is not injected. No chemosis.    Pupils: Pupils are equal, round, and reactive to light.  Cardiovascular:     Rate and Rhythm: Normal rate and regular rhythm.     Heart sounds: Normal heart sounds.  Pulmonary:     Effort: Pulmonary effort is normal. No tachypnea, accessory muscle usage or respiratory distress.     Breath sounds: Normal breath sounds. No wheezing, rhonchi or rales.  Chest:     Chest wall: No tenderness.  Lymphadenopathy:     Cervical: No cervical adenopathy.  Skin:    Coloration: Skin is not pale.     Findings: No abrasion, erythema, petechiae or rash. Rash is not papular, urticarial or vesicular.  Neurological:     Mental Status: She is alert.  Psychiatric:        Behavior: Behavior is cooperative.      Diagnostic studies:    Spirometry: results normal (FEV1: 1.88/99%, FVC: 2.41/96%, FEV1/FVC: 78%).    Spirometry consistent with normal pattern.   Allergy Studies: none       Malachi Bonds, MD  [facility]

## 2023-09-24 NOTE — Addendum Note (Signed)
Addended by: Elsworth Soho on: 09/24/2023 05:40 PM   Modules accepted: Orders

## 2023-09-25 ENCOUNTER — Ambulatory Visit: Payer: Medicare HMO | Admitting: Nurse Practitioner

## 2023-09-26 DIAGNOSIS — N958 Other specified menopausal and perimenopausal disorders: Secondary | ICD-10-CM | POA: Diagnosis not present

## 2023-09-26 DIAGNOSIS — M8589 Other specified disorders of bone density and structure, multiple sites: Secondary | ICD-10-CM | POA: Diagnosis not present

## 2023-09-26 DIAGNOSIS — Z1231 Encounter for screening mammogram for malignant neoplasm of breast: Secondary | ICD-10-CM | POA: Diagnosis not present

## 2023-09-26 DIAGNOSIS — R29890 Loss of height: Secondary | ICD-10-CM | POA: Diagnosis not present

## 2023-09-29 ENCOUNTER — Ambulatory Visit: Payer: Medicare HMO | Admitting: Podiatry

## 2023-09-29 ENCOUNTER — Encounter: Payer: Self-pay | Admitting: Podiatry

## 2023-09-29 ENCOUNTER — Telehealth: Payer: Self-pay | Admitting: Adult Health

## 2023-09-29 ENCOUNTER — Encounter: Payer: Self-pay | Admitting: Internal Medicine

## 2023-09-29 DIAGNOSIS — L97512 Non-pressure chronic ulcer of other part of right foot with fat layer exposed: Secondary | ICD-10-CM | POA: Diagnosis not present

## 2023-09-29 MED ORDER — MUPIROCIN 2 % EX OINT: 1.0000 | TOPICAL_OINTMENT | Freq: Two times a day (BID) | CUTANEOUS | 2 refills | Status: DC

## 2023-09-29 NOTE — Telephone Encounter (Signed)
Pt aware the mammogram was negative and DEXA was normal.

## 2023-09-29 NOTE — Progress Notes (Signed)
Subjective: Chief Complaint  Patient presents with   Toe Pain    PATIENT STATES THERE IS NO DIFFERENCE TYLENOL FOR PAIN , PATIENT STATES THAT IT IS HURTING REALLY BAD .PATIENT STATES TO PLEASE LOOK AT RIGHT TOE   82 year old female presents the office with above concerns.  She still gets some pain in the bottom of the big toe.  She has been using clobetasol on the wound.  Denies any drainage or pus.  No recurrent redness or cellulitis that she reports.  No fevers or chills.  No other concerns.   Objective: AAO x3, NAD DP/PT pulses palpable bilaterally, CRT less than 3 seconds On the plantar aspect of the toe there is still hyperkeratotic lesion with a central granular wound which is about the same in size.  There is under percent granulation tissue noted.  No probing to [place] or tunneling.  There is no surrounding erythema, ascending cellulitis.  There is no fluctuation or crepitation but there is no malodor. Digital deformity noted No pain with calf compression, swelling, warmth, erythema  Assessment: Ongoing ulceration right foot  Plan: -All treatment options discussed with the patient including all alternatives, risks, complications.  -Medically necessary wound debridements performed.  Sharply debrided hyperkeratotic lesion to reveal the underlying ulceration with #312 with scalpel.  After debrided the wound it still measures 0.2 x 0.2 x 0.2 cm without any probing, undermining or tunneling.  There is no blood loss.  She tolerated well.  Antibiotic ointment was applied followed by dressing. -Offloading -Mupirocin ointment dressing changes. -Referral to wound care center.  She would like to wait and see if this heals but I will then place a referral for her. -Patient encouraged to call the office with any questions, concerns, change in symptoms.   Vivi Barrack DPM

## 2023-09-29 NOTE — Patient Instructions (Signed)
Clean the ulcer with saline.  Dry very well.  Apply a small piece of the Prisma (the white pad) to the ulcer followed by a drop of saline.  Apply gauze and wrap the toe.  Change the dressing every other day.  IF you cannot do this then apply the antibiotic ointment, mupirocin ointment daily.   I have put in a referral to the wound care center as well.   Monitor for any signs/symptoms of infection. Call the office immediately if any occur or go directly to the emergency room. Call with any questions/concerns.

## 2023-10-09 ENCOUNTER — Telehealth: Payer: Self-pay

## 2023-10-09 DIAGNOSIS — U071 COVID-19: Secondary | ICD-10-CM | POA: Diagnosis not present

## 2023-10-09 DIAGNOSIS — J4530 Mild persistent asthma, uncomplicated: Secondary | ICD-10-CM | POA: Diagnosis not present

## 2023-10-09 NOTE — Telephone Encounter (Signed)
Order, demographics, and office note faxed to [facility] in [place], [phone]. Confirmation received - thanks

## 2023-10-14 ENCOUNTER — Other Ambulatory Visit: Payer: Self-pay | Admitting: Internal Medicine

## 2023-10-16 ENCOUNTER — Ambulatory Visit: Payer: Medicare HMO | Admitting: Internal Medicine

## 2023-10-16 NOTE — Progress Notes (Signed)
Erroneous encounter - please disregard.

## 2023-10-22 ENCOUNTER — Other Ambulatory Visit (HOSPITAL_COMMUNITY)
Admission: RE | Admit: 2023-10-22 | Discharge: 2023-10-22 | Disposition: A | Discharge status: Home / Self Care | Payer: Medicare HMO | Source: Ambulatory Visit | Attending: Internal Medicine | Admitting: Internal Medicine

## 2023-10-22 DIAGNOSIS — J029 Acute pharyngitis, unspecified: Secondary | ICD-10-CM | POA: Diagnosis not present

## 2023-10-22 DIAGNOSIS — J4530 Mild persistent asthma, uncomplicated: Secondary | ICD-10-CM | POA: Diagnosis not present

## 2023-10-22 DIAGNOSIS — R490 Dysphonia: Secondary | ICD-10-CM | POA: Diagnosis not present

## 2023-10-22 DIAGNOSIS — E039 Hypothyroidism, unspecified: Secondary | ICD-10-CM | POA: Diagnosis not present

## 2023-10-22 LAB — TSH: TSH: 3.359 u[IU]/mL (ref 0.350–4.500)

## 2023-10-22 LAB — T4, FREE: Free T4: 0.89 ng/dL (ref 0.61–1.12)

## 2023-10-24 ENCOUNTER — Encounter: Payer: Self-pay | Admitting: Nurse Practitioner

## 2023-10-24 ENCOUNTER — Ambulatory Visit: Payer: Medicare HMO | Attending: Nurse Practitioner | Admitting: Nurse Practitioner

## 2023-10-24 VITALS — BP 142/96 | HR 85 | Ht 64.0 in | Wt 158.0 lb

## 2023-10-24 DIAGNOSIS — I10 Essential (primary) hypertension: Secondary | ICD-10-CM | POA: Diagnosis not present

## 2023-10-24 DIAGNOSIS — I4819 Other persistent atrial fibrillation: Secondary | ICD-10-CM

## 2023-10-24 DIAGNOSIS — I4892 Unspecified atrial flutter: Secondary | ICD-10-CM

## 2023-10-24 DIAGNOSIS — I5032 Chronic diastolic (congestive) heart failure: Secondary | ICD-10-CM

## 2023-10-24 DIAGNOSIS — R600 Localized edema: Secondary | ICD-10-CM | POA: Diagnosis not present

## 2023-10-24 DIAGNOSIS — I38 Endocarditis, valve unspecified: Secondary | ICD-10-CM

## 2023-10-24 NOTE — Progress Notes (Unsigned)
Cardiology Office Note:  .   Date:  10/24/2023 ID:  [name], DOB 07-Jan-1941, MRN [id number] PCP: Benita Stabile, MD  [facility] Providers Cardiologist:  Marjo Bicker, MD    History of Present Illness: .   [name] is a 82 y.o. female with a PMH of chronic diastolic CHF, DOE, Paroxysmal A-flutter, s/p DCCV 2024, HTN, valvular insufficiency, leg edema, who presents today for leg swelling evaluation.   Noted to have SHOB since 12/2022. EKG showed A-flutter and underwent DCCV in March 2024. Completed 6 months of Amiodarone.  Last seen by Dr. Jenene Slicker on August 11, 2023. Noted improvement in her [facility] after increase with Lasix. EKG in office showed NSR. Denied any leg edema.   Today she presents for follow-up. She states she has gotten over Covid-19 recently. Says her feet still feel swollen, reports compliance with her medications. Denies any chest pain, shortness of breath, palpitations, syncope, presyncope, dizziness, orthopnea, PND, significant weight changes, acute bleeding, or claudication.  ROS: Negative. See HPI.  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   Studies Reviewed: Marland Kitchen    Lexiscan 05/2023:    Stress ECG is negative for ischemia and arrhythmias.   LV perfusion is normal. There is no evidence of ischemia. There is no evidence of infarction.   Left ventricular function is normal. Nuclear stress EF: 70 %.   Findings are consistent with no ischemia. The study is low risk.   Vascular ultrasound bilateral (DVT) 04/2023:  IMPRESSION: 1. No evidence of femoropopliteal DVT or superficial thrombophlebitis within either lower extremity. 2. Mild subcutaneous edema at the imaged distal LEFT lower extremity  Echo 04/2023:  1. Left ventricular ejection fraction, by estimation, is 60 to 65%. The  left ventricle has normal function. The left ventricle has no regional  wall motion abnormalities. Left ventricular  diastolic parameters are  indeterminate. The average left  ventricular global longitudinal strain is -20.8 %. The global longitudinal  strain is normal.   2. Right ventricular systolic function is normal. The right ventricular  size is mildly enlarged. There is normal pulmonary artery systolic  pressure. The estimated right ventricular systolic pressure is 33.0 mmHg.   3. Left atrial size was mildly dilated.   4. Right atrial size was mildly dilated.   5. The mitral valve is grossly normal. Mild mitral valve regurgitation.   6. Tricuspid valve regurgitation is mild to moderate.   7. The aortic valve is tricuspid. Aortic valve regurgitation is mild. No  aortic stenosis is present. Aortic regurgitation PHT measures 1462 msec.  Aortic valve mean gradient measures 4.0 mmHg.   8. The inferior vena cava is normal in size with greater than 50%  respiratory variability, suggesting right atrial pressure of 3 mmHg.   Comparison(s): Prior images unable to be directly viewed.  Physical Exam:   VS:  BP (!) 142/96 (BP Location: Right Arm)   Pulse 85   Ht 5\' 4"  (1.626 m)   Wt 158 lb (71.7 kg)   SpO2 98%   BMI 27.12 kg/m    Wt Readings from Last 3 Encounters:  10/24/23 158 lb (71.7 kg)  09/24/23 154 lb 6 oz (70 kg)  09/23/23 153 lb 3.2 oz (69.5 kg)    GEN: Well nourished, well developed in no acute distress NECK: No JVD; No carotid bruits CARDIAC: S1/S2, RRR, no murmurs, rubs, gallops RESPIRATORY:  Clear to auscultation without rales, wheezing or rhonchi  ABDOMEN: Soft,  non-tender, non-distended EXTREMITIES:  Nonpitting edema to BLE; No deformity   ASSESSMENT AND PLAN: .    Chronic diastolic CHF, leg edema Stage C, NYHA class I-II symptoms. EF 60-65%. Euvolemic and well compensated on exam. Continue current medication regimen. Low sodium diet, fluid restriction <2L, and daily weights encouraged. Educated to contact our office for weight gain of 2 lbs overnight or 5 lbs in one week. Continue  Lasix for leg edema, appears she has some component of chronic venous insufficiency.  Recommended compression stockings and leg elevation PRN, and she verbalized understanding. Will obtain BMET, BNP, and Mag level in 2-3 weeks.   2.  Paroxysmal A-flutter, s/p DCCV in 2024 Denies any tachycardia or palpitations. HR is well controlled today and is not on any rate controlling medications. Continue Eliquis 5 mg BID for stroke prevention, on appropriate dosage and denies any bleeding issues.   3. HTN BP mildly elevated today. Discussed to monitor BP at home at least 2 hours after medications and sitting for 5-10 minutes.  She will continue to monitor her BP at home.  Discussed SBP goal is less than 140.  She will notify our office if SBP is consistently greater than 140.  No medication changes at this time. Continue to follow with PCP.  4.  Valvular insufficiency Echocardiogram in May 2024 revealed mild MR, mild to moderate TR, as well as mild AR.  She remains asymptomatic.  Plan to update echocardiogram in 3 years or sooner if clinically indicated.   Dispo: Patient is requesting to follow-up with Dr. Jenene Slicker in February as scheduled.  Follow-up with me/APP sooner if needed. Signed, Sharlene Dory, NP

## 2023-10-24 NOTE — Patient Instructions (Addendum)
Medication Instructions:  Your physician recommends that you continue on your current medications as directed. Please refer to the Current Medication list given to you today.  Labwork: At [facility] in 2-3 weeks   Testing/Procedures: None   Follow-Up: Your physician recommends that you schedule a follow-up appointment in: February [name]   Any Other Special Instructions Will Be Listed Below (If Applicable).  If you need a refill on your cardiac medications before your next appointment, please call your pharmacy.

## 2023-10-28 ENCOUNTER — Ambulatory Visit: Payer: Medicare HMO | Admitting: Podiatry

## 2023-10-28 DIAGNOSIS — I82402 Acute embolism and thrombosis of unspecified deep veins of left lower extremity: Secondary | ICD-10-CM | POA: Diagnosis not present

## 2023-10-28 DIAGNOSIS — S9032XD Contusion of left foot, subsequent encounter: Secondary | ICD-10-CM | POA: Diagnosis not present

## 2023-10-28 DIAGNOSIS — L97512 Non-pressure chronic ulcer of other part of right foot with fat layer exposed: Secondary | ICD-10-CM | POA: Diagnosis not present

## 2023-10-28 DIAGNOSIS — E11621 Type 2 diabetes mellitus with foot ulcer: Secondary | ICD-10-CM | POA: Diagnosis not present

## 2023-10-28 DIAGNOSIS — I110 Hypertensive heart disease with heart failure: Secondary | ICD-10-CM | POA: Diagnosis not present

## 2023-10-28 DIAGNOSIS — Z880 Allergy status to penicillin: Secondary | ICD-10-CM | POA: Diagnosis not present

## 2023-10-28 DIAGNOSIS — M7989 Other specified soft tissue disorders: Secondary | ICD-10-CM | POA: Diagnosis not present

## 2023-10-28 DIAGNOSIS — M79605 Pain in left leg: Secondary | ICD-10-CM | POA: Diagnosis not present

## 2023-10-28 DIAGNOSIS — E1140 Type 2 diabetes mellitus with diabetic neuropathy, unspecified: Secondary | ICD-10-CM | POA: Diagnosis not present

## 2023-10-28 DIAGNOSIS — L97511 Non-pressure chronic ulcer of other part of right foot limited to breakdown of skin: Secondary | ICD-10-CM | POA: Diagnosis not present

## 2023-10-28 DIAGNOSIS — I509 Heart failure, unspecified: Secondary | ICD-10-CM | POA: Diagnosis not present

## 2023-10-28 DIAGNOSIS — L97519 Non-pressure chronic ulcer of other part of right foot with unspecified severity: Secondary | ICD-10-CM | POA: Diagnosis not present

## 2023-10-28 DIAGNOSIS — M7731 Calcaneal spur, right foot: Secondary | ICD-10-CM | POA: Diagnosis not present

## 2023-10-28 DIAGNOSIS — E1151 Type 2 diabetes mellitus with diabetic peripheral angiopathy without gangrene: Secondary | ICD-10-CM | POA: Diagnosis not present

## 2023-10-28 DIAGNOSIS — R600 Localized edema: Secondary | ICD-10-CM | POA: Diagnosis not present

## 2023-10-28 DIAGNOSIS — Z7901 Long term (current) use of anticoagulants: Secondary | ICD-10-CM | POA: Diagnosis not present

## 2023-10-28 DIAGNOSIS — Q666 Other congenital valgus deformities of feet: Secondary | ICD-10-CM | POA: Diagnosis not present

## 2023-10-28 DIAGNOSIS — M79604 Pain in right leg: Secondary | ICD-10-CM | POA: Diagnosis not present

## 2023-10-28 DIAGNOSIS — M2011 Hallux valgus (acquired), right foot: Secondary | ICD-10-CM | POA: Diagnosis not present

## 2023-10-29 ENCOUNTER — Telehealth: Payer: Self-pay | Admitting: *Deleted

## 2023-10-29 ENCOUNTER — Other Ambulatory Visit: Payer: Self-pay | Admitting: *Deleted

## 2023-10-29 DIAGNOSIS — L97512 Non-pressure chronic ulcer of other part of right foot with fat layer exposed: Secondary | ICD-10-CM | POA: Insufficient documentation

## 2023-10-29 DIAGNOSIS — E039 Hypothyroidism, unspecified: Secondary | ICD-10-CM

## 2023-10-29 NOTE — Telephone Encounter (Signed)
Patient left a message and said that she had a Mychart message that she can not get to. It appears that it is from Octa Endo. She says that she had lab work for the heart doctor. I see that she had T4 free, TSH done at hospital, this is something that you had ordered. What would you like me to share about the results. She is having trouble opening it.  labs look stable, she is to continue her current dose of thyroid hormone at this time.  We can push her follow up appointment out for another 3 months with repeat labs the week before she returns (will need TSH and FT4 only)  Patient was called and it just continued to ring. Will try and call back. Talked with the patient and gave her the results. She will need her appointment moved up for 3 months, cancel January 2025.

## 2023-10-29 NOTE — Telephone Encounter (Signed)
Noted  

## 2023-11-01 DIAGNOSIS — E11621 Type 2 diabetes mellitus with foot ulcer: Secondary | ICD-10-CM | POA: Diagnosis not present

## 2023-11-01 DIAGNOSIS — D649 Anemia, unspecified: Secondary | ICD-10-CM | POA: Diagnosis not present

## 2023-11-01 DIAGNOSIS — I890 Lymphedema, not elsewhere classified: Secondary | ICD-10-CM | POA: Diagnosis not present

## 2023-11-01 DIAGNOSIS — I509 Heart failure, unspecified: Secondary | ICD-10-CM | POA: Diagnosis not present

## 2023-11-01 DIAGNOSIS — Z7901 Long term (current) use of anticoagulants: Secondary | ICD-10-CM | POA: Diagnosis not present

## 2023-11-01 DIAGNOSIS — L97512 Non-pressure chronic ulcer of other part of right foot with fat layer exposed: Secondary | ICD-10-CM | POA: Diagnosis not present

## 2023-11-01 DIAGNOSIS — F419 Anxiety disorder, unspecified: Secondary | ICD-10-CM | POA: Diagnosis not present

## 2023-11-01 DIAGNOSIS — K5900 Constipation, unspecified: Secondary | ICD-10-CM | POA: Diagnosis not present

## 2023-11-01 DIAGNOSIS — I872 Venous insufficiency (chronic) (peripheral): Secondary | ICD-10-CM | POA: Diagnosis not present

## 2023-11-04 DIAGNOSIS — F419 Anxiety disorder, unspecified: Secondary | ICD-10-CM | POA: Diagnosis not present

## 2023-11-04 DIAGNOSIS — Z7901 Long term (current) use of anticoagulants: Secondary | ICD-10-CM | POA: Diagnosis not present

## 2023-11-04 DIAGNOSIS — D649 Anemia, unspecified: Secondary | ICD-10-CM | POA: Diagnosis not present

## 2023-11-04 DIAGNOSIS — I890 Lymphedema, not elsewhere classified: Secondary | ICD-10-CM | POA: Diagnosis not present

## 2023-11-04 DIAGNOSIS — E11621 Type 2 diabetes mellitus with foot ulcer: Secondary | ICD-10-CM | POA: Diagnosis not present

## 2023-11-04 DIAGNOSIS — I509 Heart failure, unspecified: Secondary | ICD-10-CM | POA: Diagnosis not present

## 2023-11-04 DIAGNOSIS — I872 Venous insufficiency (chronic) (peripheral): Secondary | ICD-10-CM | POA: Diagnosis not present

## 2023-11-04 DIAGNOSIS — L97512 Non-pressure chronic ulcer of other part of right foot with fat layer exposed: Secondary | ICD-10-CM | POA: Diagnosis not present

## 2023-11-04 DIAGNOSIS — K5900 Constipation, unspecified: Secondary | ICD-10-CM | POA: Diagnosis not present

## 2023-11-07 DIAGNOSIS — I509 Heart failure, unspecified: Secondary | ICD-10-CM | POA: Diagnosis not present

## 2023-11-07 DIAGNOSIS — E11621 Type 2 diabetes mellitus with foot ulcer: Secondary | ICD-10-CM | POA: Diagnosis not present

## 2023-11-07 DIAGNOSIS — F419 Anxiety disorder, unspecified: Secondary | ICD-10-CM | POA: Diagnosis not present

## 2023-11-07 DIAGNOSIS — D649 Anemia, unspecified: Secondary | ICD-10-CM | POA: Diagnosis not present

## 2023-11-07 DIAGNOSIS — K5900 Constipation, unspecified: Secondary | ICD-10-CM | POA: Diagnosis not present

## 2023-11-07 DIAGNOSIS — I872 Venous insufficiency (chronic) (peripheral): Secondary | ICD-10-CM | POA: Diagnosis not present

## 2023-11-07 DIAGNOSIS — L97512 Non-pressure chronic ulcer of other part of right foot with fat layer exposed: Secondary | ICD-10-CM | POA: Diagnosis not present

## 2023-11-07 DIAGNOSIS — Z7901 Long term (current) use of anticoagulants: Secondary | ICD-10-CM | POA: Diagnosis not present

## 2023-11-07 DIAGNOSIS — I890 Lymphedema, not elsewhere classified: Secondary | ICD-10-CM | POA: Diagnosis not present

## 2023-11-10 ENCOUNTER — Ambulatory Visit: Payer: Medicare HMO | Admitting: Nurse Practitioner

## 2023-11-10 DIAGNOSIS — F419 Anxiety disorder, unspecified: Secondary | ICD-10-CM | POA: Diagnosis not present

## 2023-11-10 DIAGNOSIS — D649 Anemia, unspecified: Secondary | ICD-10-CM | POA: Diagnosis not present

## 2023-11-10 DIAGNOSIS — E11621 Type 2 diabetes mellitus with foot ulcer: Secondary | ICD-10-CM | POA: Diagnosis not present

## 2023-11-10 DIAGNOSIS — I890 Lymphedema, not elsewhere classified: Secondary | ICD-10-CM | POA: Diagnosis not present

## 2023-11-10 DIAGNOSIS — I509 Heart failure, unspecified: Secondary | ICD-10-CM | POA: Diagnosis not present

## 2023-11-10 DIAGNOSIS — L97512 Non-pressure chronic ulcer of other part of right foot with fat layer exposed: Secondary | ICD-10-CM | POA: Diagnosis not present

## 2023-11-10 DIAGNOSIS — Z7901 Long term (current) use of anticoagulants: Secondary | ICD-10-CM | POA: Diagnosis not present

## 2023-11-10 DIAGNOSIS — K5900 Constipation, unspecified: Secondary | ICD-10-CM | POA: Diagnosis not present

## 2023-11-10 DIAGNOSIS — I872 Venous insufficiency (chronic) (peripheral): Secondary | ICD-10-CM | POA: Diagnosis not present

## 2023-11-11 ENCOUNTER — Other Ambulatory Visit (HOSPITAL_COMMUNITY)
Admission: RE | Admit: 2023-11-11 | Discharge: 2023-11-11 | Disposition: A | Discharge status: Home / Self Care | Payer: Medicare HMO | Source: Ambulatory Visit | Attending: Internal Medicine | Admitting: Internal Medicine

## 2023-11-11 DIAGNOSIS — E1151 Type 2 diabetes mellitus with diabetic peripheral angiopathy without gangrene: Secondary | ICD-10-CM | POA: Diagnosis not present

## 2023-11-11 DIAGNOSIS — Z880 Allergy status to penicillin: Secondary | ICD-10-CM | POA: Diagnosis not present

## 2023-11-11 DIAGNOSIS — E08621 Diabetes mellitus due to underlying condition with foot ulcer: Secondary | ICD-10-CM | POA: Diagnosis not present

## 2023-11-11 DIAGNOSIS — I110 Hypertensive heart disease with heart failure: Secondary | ICD-10-CM | POA: Diagnosis not present

## 2023-11-11 DIAGNOSIS — M7989 Other specified soft tissue disorders: Secondary | ICD-10-CM | POA: Diagnosis not present

## 2023-11-11 DIAGNOSIS — E11621 Type 2 diabetes mellitus with foot ulcer: Secondary | ICD-10-CM | POA: Diagnosis not present

## 2023-11-11 DIAGNOSIS — S90122D Contusion of left lesser toe(s) without damage to nail, subsequent encounter: Secondary | ICD-10-CM | POA: Diagnosis not present

## 2023-11-11 DIAGNOSIS — Z79899 Other long term (current) drug therapy: Secondary | ICD-10-CM | POA: Insufficient documentation

## 2023-11-11 DIAGNOSIS — S9032XD Contusion of left foot, subsequent encounter: Secondary | ICD-10-CM | POA: Diagnosis not present

## 2023-11-11 DIAGNOSIS — L97529 Non-pressure chronic ulcer of other part of left foot with unspecified severity: Secondary | ICD-10-CM | POA: Diagnosis not present

## 2023-11-11 DIAGNOSIS — E11319 Type 2 diabetes mellitus with unspecified diabetic retinopathy without macular edema: Secondary | ICD-10-CM | POA: Insufficient documentation

## 2023-11-11 DIAGNOSIS — E1140 Type 2 diabetes mellitus with diabetic neuropathy, unspecified: Secondary | ICD-10-CM | POA: Diagnosis not present

## 2023-11-11 DIAGNOSIS — I509 Heart failure, unspecified: Secondary | ICD-10-CM | POA: Diagnosis not present

## 2023-11-11 DIAGNOSIS — Z7901 Long term (current) use of anticoagulants: Secondary | ICD-10-CM | POA: Diagnosis not present

## 2023-11-11 DIAGNOSIS — L97512 Non-pressure chronic ulcer of other part of right foot with fat layer exposed: Secondary | ICD-10-CM | POA: Diagnosis not present

## 2023-11-11 DIAGNOSIS — M7732 Calcaneal spur, left foot: Secondary | ICD-10-CM | POA: Diagnosis not present

## 2023-11-11 DIAGNOSIS — X58XXXD Exposure to other specified factors, subsequent encounter: Secondary | ICD-10-CM | POA: Diagnosis not present

## 2023-11-11 LAB — BASIC METABOLIC PANEL
Anion gap: 9 (ref 5–15)
BUN: 18 mg/dL (ref 8–23)
CO2: 25 mmol/L (ref 22–32)
Calcium: 8.8 mg/dL — ABNORMAL LOW (ref 8.9–10.3)
Chloride: 101 mmol/L (ref 98–111)
Creatinine, Ser: 1.06 mg/dL — ABNORMAL HIGH (ref 0.44–1.00)
GFR, Estimated: 53 mL/min — ABNORMAL LOW (ref >60)
Glucose, Bld: 93 mg/dL (ref 70–99)
Potassium: 3.5 mmol/L (ref 3.5–5.1)
Sodium: 135 mmol/L (ref 135–145)

## 2023-11-12 DIAGNOSIS — J029 Acute pharyngitis, unspecified: Secondary | ICD-10-CM | POA: Diagnosis not present

## 2023-11-12 DIAGNOSIS — R490 Dysphonia: Secondary | ICD-10-CM | POA: Diagnosis not present

## 2023-11-12 DIAGNOSIS — J4530 Mild persistent asthma, uncomplicated: Secondary | ICD-10-CM | POA: Diagnosis not present

## 2023-11-12 DIAGNOSIS — R059 Cough, unspecified: Secondary | ICD-10-CM | POA: Diagnosis not present

## 2023-11-12 DIAGNOSIS — M6281 Muscle weakness (generalized): Secondary | ICD-10-CM | POA: Diagnosis not present

## 2023-11-13 DIAGNOSIS — L97512 Non-pressure chronic ulcer of other part of right foot with fat layer exposed: Secondary | ICD-10-CM | POA: Diagnosis not present

## 2023-11-14 DIAGNOSIS — K5900 Constipation, unspecified: Secondary | ICD-10-CM | POA: Diagnosis not present

## 2023-11-14 DIAGNOSIS — F419 Anxiety disorder, unspecified: Secondary | ICD-10-CM | POA: Diagnosis not present

## 2023-11-14 DIAGNOSIS — D649 Anemia, unspecified: Secondary | ICD-10-CM | POA: Diagnosis not present

## 2023-11-14 DIAGNOSIS — L97512 Non-pressure chronic ulcer of other part of right foot with fat layer exposed: Secondary | ICD-10-CM | POA: Diagnosis not present

## 2023-11-14 DIAGNOSIS — I509 Heart failure, unspecified: Secondary | ICD-10-CM | POA: Diagnosis not present

## 2023-11-14 DIAGNOSIS — E11621 Type 2 diabetes mellitus with foot ulcer: Secondary | ICD-10-CM | POA: Diagnosis not present

## 2023-11-14 DIAGNOSIS — I890 Lymphedema, not elsewhere classified: Secondary | ICD-10-CM | POA: Diagnosis not present

## 2023-11-14 DIAGNOSIS — Z7901 Long term (current) use of anticoagulants: Secondary | ICD-10-CM | POA: Diagnosis not present

## 2023-11-14 DIAGNOSIS — I872 Venous insufficiency (chronic) (peripheral): Secondary | ICD-10-CM | POA: Diagnosis not present

## 2023-11-17 ENCOUNTER — Telehealth: Payer: Self-pay

## 2023-11-17 DIAGNOSIS — I872 Venous insufficiency (chronic) (peripheral): Secondary | ICD-10-CM | POA: Diagnosis not present

## 2023-11-17 DIAGNOSIS — I509 Heart failure, unspecified: Secondary | ICD-10-CM | POA: Diagnosis not present

## 2023-11-17 DIAGNOSIS — E11621 Type 2 diabetes mellitus with foot ulcer: Secondary | ICD-10-CM | POA: Diagnosis not present

## 2023-11-17 DIAGNOSIS — D649 Anemia, unspecified: Secondary | ICD-10-CM | POA: Diagnosis not present

## 2023-11-17 DIAGNOSIS — L97512 Non-pressure chronic ulcer of other part of right foot with fat layer exposed: Secondary | ICD-10-CM | POA: Diagnosis not present

## 2023-11-17 DIAGNOSIS — F419 Anxiety disorder, unspecified: Secondary | ICD-10-CM | POA: Diagnosis not present

## 2023-11-17 DIAGNOSIS — K5900 Constipation, unspecified: Secondary | ICD-10-CM | POA: Diagnosis not present

## 2023-11-17 DIAGNOSIS — I890 Lymphedema, not elsewhere classified: Secondary | ICD-10-CM | POA: Diagnosis not present

## 2023-11-17 DIAGNOSIS — Z7901 Long term (current) use of anticoagulants: Secondary | ICD-10-CM | POA: Diagnosis not present

## 2023-11-17 NOTE — Telephone Encounter (Signed)
Patient informed and verbalized understanding of plan. 

## 2023-11-17 NOTE — Telephone Encounter (Signed)
-----   Message from Vishnu P Mallipeddi sent at 11/14/2023  2:25 PM EST ----- Stable serum creatinine, continue current medications.

## 2023-11-18 ENCOUNTER — Ambulatory Visit: Payer: Medicare HMO | Admitting: Internal Medicine

## 2023-11-18 DIAGNOSIS — Z7901 Long term (current) use of anticoagulants: Secondary | ICD-10-CM | POA: Diagnosis not present

## 2023-11-18 DIAGNOSIS — L97512 Non-pressure chronic ulcer of other part of right foot with fat layer exposed: Secondary | ICD-10-CM | POA: Diagnosis not present

## 2023-11-18 DIAGNOSIS — I110 Hypertensive heart disease with heart failure: Secondary | ICD-10-CM | POA: Diagnosis not present

## 2023-11-18 DIAGNOSIS — S90122D Contusion of left lesser toe(s) without damage to nail, subsequent encounter: Secondary | ICD-10-CM | POA: Diagnosis not present

## 2023-11-18 DIAGNOSIS — E1151 Type 2 diabetes mellitus with diabetic peripheral angiopathy without gangrene: Secondary | ICD-10-CM | POA: Diagnosis not present

## 2023-11-18 DIAGNOSIS — I509 Heart failure, unspecified: Secondary | ICD-10-CM | POA: Diagnosis not present

## 2023-11-18 DIAGNOSIS — X58XXXD Exposure to other specified factors, subsequent encounter: Secondary | ICD-10-CM | POA: Diagnosis not present

## 2023-11-18 DIAGNOSIS — Z880 Allergy status to penicillin: Secondary | ICD-10-CM | POA: Diagnosis not present

## 2023-11-18 DIAGNOSIS — E1140 Type 2 diabetes mellitus with diabetic neuropathy, unspecified: Secondary | ICD-10-CM | POA: Diagnosis not present

## 2023-11-18 DIAGNOSIS — S9032XD Contusion of left foot, subsequent encounter: Secondary | ICD-10-CM | POA: Diagnosis not present

## 2023-11-18 DIAGNOSIS — E11621 Type 2 diabetes mellitus with foot ulcer: Secondary | ICD-10-CM | POA: Diagnosis not present

## 2023-11-20 DIAGNOSIS — M25551 Pain in right hip: Secondary | ICD-10-CM | POA: Diagnosis not present

## 2023-11-20 DIAGNOSIS — M1711 Unilateral primary osteoarthritis, right knee: Secondary | ICD-10-CM | POA: Diagnosis not present

## 2023-11-21 ENCOUNTER — Ambulatory Visit: Payer: Medicare HMO | Admitting: Internal Medicine

## 2023-11-21 DIAGNOSIS — I872 Venous insufficiency (chronic) (peripheral): Secondary | ICD-10-CM | POA: Diagnosis not present

## 2023-11-21 DIAGNOSIS — F419 Anxiety disorder, unspecified: Secondary | ICD-10-CM | POA: Diagnosis not present

## 2023-11-21 DIAGNOSIS — L97512 Non-pressure chronic ulcer of other part of right foot with fat layer exposed: Secondary | ICD-10-CM | POA: Diagnosis not present

## 2023-11-21 DIAGNOSIS — I509 Heart failure, unspecified: Secondary | ICD-10-CM | POA: Diagnosis not present

## 2023-11-21 DIAGNOSIS — D649 Anemia, unspecified: Secondary | ICD-10-CM | POA: Diagnosis not present

## 2023-11-21 DIAGNOSIS — K5900 Constipation, unspecified: Secondary | ICD-10-CM | POA: Diagnosis not present

## 2023-11-21 DIAGNOSIS — I890 Lymphedema, not elsewhere classified: Secondary | ICD-10-CM | POA: Diagnosis not present

## 2023-11-21 DIAGNOSIS — E11621 Type 2 diabetes mellitus with foot ulcer: Secondary | ICD-10-CM | POA: Diagnosis not present

## 2023-11-21 DIAGNOSIS — Z7901 Long term (current) use of anticoagulants: Secondary | ICD-10-CM | POA: Diagnosis not present

## 2023-11-24 DIAGNOSIS — F419 Anxiety disorder, unspecified: Secondary | ICD-10-CM | POA: Diagnosis not present

## 2023-11-24 DIAGNOSIS — L97512 Non-pressure chronic ulcer of other part of right foot with fat layer exposed: Secondary | ICD-10-CM | POA: Diagnosis not present

## 2023-11-24 DIAGNOSIS — Z7901 Long term (current) use of anticoagulants: Secondary | ICD-10-CM | POA: Diagnosis not present

## 2023-11-24 DIAGNOSIS — I890 Lymphedema, not elsewhere classified: Secondary | ICD-10-CM | POA: Diagnosis not present

## 2023-11-24 DIAGNOSIS — I509 Heart failure, unspecified: Secondary | ICD-10-CM | POA: Diagnosis not present

## 2023-11-24 DIAGNOSIS — D649 Anemia, unspecified: Secondary | ICD-10-CM | POA: Diagnosis not present

## 2023-11-24 DIAGNOSIS — I872 Venous insufficiency (chronic) (peripheral): Secondary | ICD-10-CM | POA: Diagnosis not present

## 2023-11-24 DIAGNOSIS — K5900 Constipation, unspecified: Secondary | ICD-10-CM | POA: Diagnosis not present

## 2023-11-24 DIAGNOSIS — E11621 Type 2 diabetes mellitus with foot ulcer: Secondary | ICD-10-CM | POA: Diagnosis not present

## 2023-11-28 DIAGNOSIS — I509 Heart failure, unspecified: Secondary | ICD-10-CM | POA: Diagnosis not present

## 2023-11-28 DIAGNOSIS — K5900 Constipation, unspecified: Secondary | ICD-10-CM | POA: Diagnosis not present

## 2023-11-28 DIAGNOSIS — D649 Anemia, unspecified: Secondary | ICD-10-CM | POA: Diagnosis not present

## 2023-11-28 DIAGNOSIS — F419 Anxiety disorder, unspecified: Secondary | ICD-10-CM | POA: Diagnosis not present

## 2023-11-28 DIAGNOSIS — Z7901 Long term (current) use of anticoagulants: Secondary | ICD-10-CM | POA: Diagnosis not present

## 2023-11-28 DIAGNOSIS — L97512 Non-pressure chronic ulcer of other part of right foot with fat layer exposed: Secondary | ICD-10-CM | POA: Diagnosis not present

## 2023-11-28 DIAGNOSIS — I872 Venous insufficiency (chronic) (peripheral): Secondary | ICD-10-CM | POA: Diagnosis not present

## 2023-11-28 DIAGNOSIS — E11621 Type 2 diabetes mellitus with foot ulcer: Secondary | ICD-10-CM | POA: Diagnosis not present

## 2023-11-28 DIAGNOSIS — I890 Lymphedema, not elsewhere classified: Secondary | ICD-10-CM | POA: Diagnosis not present

## 2023-12-01 DIAGNOSIS — Z7901 Long term (current) use of anticoagulants: Secondary | ICD-10-CM | POA: Diagnosis not present

## 2023-12-01 DIAGNOSIS — K5900 Constipation, unspecified: Secondary | ICD-10-CM | POA: Diagnosis not present

## 2023-12-01 DIAGNOSIS — D649 Anemia, unspecified: Secondary | ICD-10-CM | POA: Diagnosis not present

## 2023-12-01 DIAGNOSIS — F419 Anxiety disorder, unspecified: Secondary | ICD-10-CM | POA: Diagnosis not present

## 2023-12-01 DIAGNOSIS — L97512 Non-pressure chronic ulcer of other part of right foot with fat layer exposed: Secondary | ICD-10-CM | POA: Diagnosis not present

## 2023-12-01 DIAGNOSIS — I872 Venous insufficiency (chronic) (peripheral): Secondary | ICD-10-CM | POA: Diagnosis not present

## 2023-12-01 DIAGNOSIS — I890 Lymphedema, not elsewhere classified: Secondary | ICD-10-CM | POA: Diagnosis not present

## 2023-12-01 DIAGNOSIS — E11621 Type 2 diabetes mellitus with foot ulcer: Secondary | ICD-10-CM | POA: Diagnosis not present

## 2023-12-01 DIAGNOSIS — I509 Heart failure, unspecified: Secondary | ICD-10-CM | POA: Diagnosis not present

## 2023-12-05 DIAGNOSIS — I890 Lymphedema, not elsewhere classified: Secondary | ICD-10-CM | POA: Diagnosis not present

## 2023-12-05 DIAGNOSIS — L97512 Non-pressure chronic ulcer of other part of right foot with fat layer exposed: Secondary | ICD-10-CM | POA: Diagnosis not present

## 2023-12-05 DIAGNOSIS — I872 Venous insufficiency (chronic) (peripheral): Secondary | ICD-10-CM | POA: Diagnosis not present

## 2023-12-05 DIAGNOSIS — D649 Anemia, unspecified: Secondary | ICD-10-CM | POA: Diagnosis not present

## 2023-12-05 DIAGNOSIS — E11621 Type 2 diabetes mellitus with foot ulcer: Secondary | ICD-10-CM | POA: Diagnosis not present

## 2023-12-05 DIAGNOSIS — I509 Heart failure, unspecified: Secondary | ICD-10-CM | POA: Diagnosis not present

## 2023-12-05 DIAGNOSIS — F419 Anxiety disorder, unspecified: Secondary | ICD-10-CM | POA: Diagnosis not present

## 2023-12-05 DIAGNOSIS — K5900 Constipation, unspecified: Secondary | ICD-10-CM | POA: Diagnosis not present

## 2023-12-05 DIAGNOSIS — Z7901 Long term (current) use of anticoagulants: Secondary | ICD-10-CM | POA: Diagnosis not present

## 2023-12-08 DIAGNOSIS — Z7901 Long term (current) use of anticoagulants: Secondary | ICD-10-CM | POA: Diagnosis not present

## 2023-12-08 DIAGNOSIS — F419 Anxiety disorder, unspecified: Secondary | ICD-10-CM | POA: Diagnosis not present

## 2023-12-08 DIAGNOSIS — I872 Venous insufficiency (chronic) (peripheral): Secondary | ICD-10-CM | POA: Diagnosis not present

## 2023-12-08 DIAGNOSIS — I890 Lymphedema, not elsewhere classified: Secondary | ICD-10-CM | POA: Diagnosis not present

## 2023-12-08 DIAGNOSIS — K5900 Constipation, unspecified: Secondary | ICD-10-CM | POA: Diagnosis not present

## 2023-12-08 DIAGNOSIS — D649 Anemia, unspecified: Secondary | ICD-10-CM | POA: Diagnosis not present

## 2023-12-08 DIAGNOSIS — I509 Heart failure, unspecified: Secondary | ICD-10-CM | POA: Diagnosis not present

## 2023-12-08 DIAGNOSIS — L97512 Non-pressure chronic ulcer of other part of right foot with fat layer exposed: Secondary | ICD-10-CM | POA: Diagnosis not present

## 2023-12-08 DIAGNOSIS — E11621 Type 2 diabetes mellitus with foot ulcer: Secondary | ICD-10-CM | POA: Diagnosis not present

## 2023-12-09 DIAGNOSIS — E11621 Type 2 diabetes mellitus with foot ulcer: Secondary | ICD-10-CM | POA: Diagnosis not present

## 2023-12-09 DIAGNOSIS — D649 Anemia, unspecified: Secondary | ICD-10-CM | POA: Diagnosis not present

## 2023-12-09 DIAGNOSIS — Z87891 Personal history of nicotine dependence: Secondary | ICD-10-CM | POA: Diagnosis not present

## 2023-12-09 DIAGNOSIS — Z79899 Other long term (current) drug therapy: Secondary | ICD-10-CM | POA: Diagnosis not present

## 2023-12-09 DIAGNOSIS — I509 Heart failure, unspecified: Secondary | ICD-10-CM | POA: Diagnosis not present

## 2023-12-09 DIAGNOSIS — K219 Gastro-esophageal reflux disease without esophagitis: Secondary | ICD-10-CM | POA: Diagnosis not present

## 2023-12-09 DIAGNOSIS — I110 Hypertensive heart disease with heart failure: Secondary | ICD-10-CM | POA: Diagnosis not present

## 2023-12-09 DIAGNOSIS — L97512 Non-pressure chronic ulcer of other part of right foot with fat layer exposed: Secondary | ICD-10-CM | POA: Diagnosis not present

## 2023-12-11 ENCOUNTER — Ambulatory Visit: Payer: Medicare HMO | Admitting: Podiatry

## 2023-12-11 DIAGNOSIS — L97512 Non-pressure chronic ulcer of other part of right foot with fat layer exposed: Secondary | ICD-10-CM | POA: Diagnosis not present

## 2023-12-12 DIAGNOSIS — F419 Anxiety disorder, unspecified: Secondary | ICD-10-CM | POA: Diagnosis not present

## 2023-12-12 DIAGNOSIS — E11621 Type 2 diabetes mellitus with foot ulcer: Secondary | ICD-10-CM | POA: Diagnosis not present

## 2023-12-12 DIAGNOSIS — K5900 Constipation, unspecified: Secondary | ICD-10-CM | POA: Diagnosis not present

## 2023-12-12 DIAGNOSIS — I509 Heart failure, unspecified: Secondary | ICD-10-CM | POA: Diagnosis not present

## 2023-12-12 DIAGNOSIS — I890 Lymphedema, not elsewhere classified: Secondary | ICD-10-CM | POA: Diagnosis not present

## 2023-12-12 DIAGNOSIS — I872 Venous insufficiency (chronic) (peripheral): Secondary | ICD-10-CM | POA: Diagnosis not present

## 2023-12-12 DIAGNOSIS — D649 Anemia, unspecified: Secondary | ICD-10-CM | POA: Diagnosis not present

## 2023-12-12 DIAGNOSIS — L97512 Non-pressure chronic ulcer of other part of right foot with fat layer exposed: Secondary | ICD-10-CM | POA: Diagnosis not present

## 2023-12-12 DIAGNOSIS — Z7901 Long term (current) use of anticoagulants: Secondary | ICD-10-CM | POA: Diagnosis not present

## 2023-12-12 NOTE — Progress Notes (Signed)
 Subjective: Chief Complaint  Patient presents with   Foot Ulcer    Rm#11 right foot ulcer follow up being treated at wound center in [place]. Ulcer site stared bleeding last night. Patient has purchased new shoes and has our inserts.    83 year old female presents the office with above concerns.  She has been going to the wound care center.  She also states that she is slowly coming to do the dressing at home.  She presents today to have her insert modified to help decrease pressure.  She does not report any fevers or chills.  No increased pain.  No other concerns.    Objective: AAO x3, NAD DP/PT pulses palpable bilaterally, CRT less than 3 seconds On the plantar aspect of the toe there is still hyperkeratotic lesion and not able to measure the wound prior to debridement.  After debridement, which is pictured below, there is a granular ulceration without any probing, undermining or tunneling.  There is no surrounding erythema, cellulitis.  There is no dried blood present on the toe.  There is no active bleeding identified today.  Hallux malleus present. No pain with calf compression, swelling, warmth, erythema    Assessment: Ongoing ulceration right foot  Plan: -All treatment options discussed with the patient including all alternatives, risks, complications.  -Medically necessary wound debridements performed.  Sharply debrided hyperkeratotic lesion to reveal the underlying ulceration with #312 with scalpel.  After debrided the wound it still measures 0.4 x 0.3 x 0.1 cm without any probing, undermining or tunneling.  There is no blood loss.  She tolerated well.  Antibiotic ointment was applied followed by dressing. -Offloading- Tricia, pedorthotist, did modify the insert today to help decrease pressure.  -Continue dressing changes as directed by the wound care center. -Patient encouraged to call the office with any questions, concerns, change in symptoms.   RTC in 2-3 weeks for follow-up  since she is not going back to the wound care center until March.   Donnice JONELLE Fees DPM

## 2023-12-15 DIAGNOSIS — I890 Lymphedema, not elsewhere classified: Secondary | ICD-10-CM | POA: Diagnosis not present

## 2023-12-15 DIAGNOSIS — I872 Venous insufficiency (chronic) (peripheral): Secondary | ICD-10-CM | POA: Diagnosis not present

## 2023-12-15 DIAGNOSIS — E11621 Type 2 diabetes mellitus with foot ulcer: Secondary | ICD-10-CM | POA: Diagnosis not present

## 2023-12-15 DIAGNOSIS — L97512 Non-pressure chronic ulcer of other part of right foot with fat layer exposed: Secondary | ICD-10-CM | POA: Diagnosis not present

## 2023-12-15 DIAGNOSIS — Z7901 Long term (current) use of anticoagulants: Secondary | ICD-10-CM | POA: Diagnosis not present

## 2023-12-15 DIAGNOSIS — F419 Anxiety disorder, unspecified: Secondary | ICD-10-CM | POA: Diagnosis not present

## 2023-12-15 DIAGNOSIS — K5900 Constipation, unspecified: Secondary | ICD-10-CM | POA: Diagnosis not present

## 2023-12-15 DIAGNOSIS — D649 Anemia, unspecified: Secondary | ICD-10-CM | POA: Diagnosis not present

## 2023-12-15 DIAGNOSIS — I509 Heart failure, unspecified: Secondary | ICD-10-CM | POA: Diagnosis not present

## 2023-12-16 ENCOUNTER — Ambulatory Visit: Payer: Medicare HMO | Admitting: Internal Medicine

## 2023-12-16 DIAGNOSIS — M25551 Pain in right hip: Secondary | ICD-10-CM | POA: Diagnosis not present

## 2023-12-16 DIAGNOSIS — M1711 Unilateral primary osteoarthritis, right knee: Secondary | ICD-10-CM | POA: Diagnosis not present

## 2023-12-18 DIAGNOSIS — H04123 Dry eye syndrome of bilateral lacrimal glands: Secondary | ICD-10-CM | POA: Diagnosis not present

## 2023-12-19 DIAGNOSIS — I509 Heart failure, unspecified: Secondary | ICD-10-CM | POA: Diagnosis not present

## 2023-12-19 DIAGNOSIS — I872 Venous insufficiency (chronic) (peripheral): Secondary | ICD-10-CM | POA: Diagnosis not present

## 2023-12-19 DIAGNOSIS — L97512 Non-pressure chronic ulcer of other part of right foot with fat layer exposed: Secondary | ICD-10-CM | POA: Diagnosis not present

## 2023-12-19 DIAGNOSIS — F419 Anxiety disorder, unspecified: Secondary | ICD-10-CM | POA: Diagnosis not present

## 2023-12-19 DIAGNOSIS — Z7901 Long term (current) use of anticoagulants: Secondary | ICD-10-CM | POA: Diagnosis not present

## 2023-12-19 DIAGNOSIS — E11621 Type 2 diabetes mellitus with foot ulcer: Secondary | ICD-10-CM | POA: Diagnosis not present

## 2023-12-19 DIAGNOSIS — K5900 Constipation, unspecified: Secondary | ICD-10-CM | POA: Diagnosis not present

## 2023-12-19 DIAGNOSIS — I890 Lymphedema, not elsewhere classified: Secondary | ICD-10-CM | POA: Diagnosis not present

## 2023-12-19 DIAGNOSIS — D649 Anemia, unspecified: Secondary | ICD-10-CM | POA: Diagnosis not present

## 2023-12-23 DIAGNOSIS — F419 Anxiety disorder, unspecified: Secondary | ICD-10-CM | POA: Diagnosis not present

## 2023-12-23 DIAGNOSIS — E039 Hypothyroidism, unspecified: Secondary | ICD-10-CM | POA: Diagnosis not present

## 2023-12-23 DIAGNOSIS — I872 Venous insufficiency (chronic) (peripheral): Secondary | ICD-10-CM | POA: Diagnosis not present

## 2023-12-23 DIAGNOSIS — L97512 Non-pressure chronic ulcer of other part of right foot with fat layer exposed: Secondary | ICD-10-CM | POA: Diagnosis not present

## 2023-12-23 DIAGNOSIS — Z7901 Long term (current) use of anticoagulants: Secondary | ICD-10-CM | POA: Diagnosis not present

## 2023-12-23 DIAGNOSIS — K5900 Constipation, unspecified: Secondary | ICD-10-CM | POA: Diagnosis not present

## 2023-12-23 DIAGNOSIS — D649 Anemia, unspecified: Secondary | ICD-10-CM | POA: Diagnosis not present

## 2023-12-23 DIAGNOSIS — E11621 Type 2 diabetes mellitus with foot ulcer: Secondary | ICD-10-CM | POA: Diagnosis not present

## 2023-12-23 DIAGNOSIS — I890 Lymphedema, not elsewhere classified: Secondary | ICD-10-CM | POA: Diagnosis not present

## 2023-12-23 DIAGNOSIS — I509 Heart failure, unspecified: Secondary | ICD-10-CM | POA: Diagnosis not present

## 2023-12-24 ENCOUNTER — Other Ambulatory Visit: Payer: Self-pay | Admitting: Internal Medicine

## 2023-12-24 LAB — T4, FREE: Free T4: 1.19 ng/dL (ref 0.82–1.77)

## 2023-12-24 LAB — TSH: TSH: 3.78 u[IU]/mL (ref 0.450–4.500)

## 2023-12-25 ENCOUNTER — Encounter: Payer: Self-pay | Admitting: Podiatry

## 2023-12-25 ENCOUNTER — Ambulatory Visit: Payer: Medicare HMO | Admitting: Podiatry

## 2023-12-25 DIAGNOSIS — M2032 Hallux varus (acquired), left foot: Secondary | ICD-10-CM

## 2023-12-25 DIAGNOSIS — L84 Corns and callosities: Secondary | ICD-10-CM

## 2023-12-26 ENCOUNTER — Ambulatory Visit: Payer: Medicare HMO | Admitting: Podiatry

## 2023-12-26 DIAGNOSIS — I509 Heart failure, unspecified: Secondary | ICD-10-CM | POA: Diagnosis not present

## 2023-12-26 DIAGNOSIS — Z7901 Long term (current) use of anticoagulants: Secondary | ICD-10-CM | POA: Diagnosis not present

## 2023-12-26 DIAGNOSIS — K5900 Constipation, unspecified: Secondary | ICD-10-CM | POA: Diagnosis not present

## 2023-12-26 DIAGNOSIS — E11621 Type 2 diabetes mellitus with foot ulcer: Secondary | ICD-10-CM | POA: Diagnosis not present

## 2023-12-26 DIAGNOSIS — L97512 Non-pressure chronic ulcer of other part of right foot with fat layer exposed: Secondary | ICD-10-CM | POA: Diagnosis not present

## 2023-12-26 DIAGNOSIS — I872 Venous insufficiency (chronic) (peripheral): Secondary | ICD-10-CM | POA: Diagnosis not present

## 2023-12-26 DIAGNOSIS — I890 Lymphedema, not elsewhere classified: Secondary | ICD-10-CM | POA: Diagnosis not present

## 2023-12-26 DIAGNOSIS — F419 Anxiety disorder, unspecified: Secondary | ICD-10-CM | POA: Diagnosis not present

## 2023-12-26 DIAGNOSIS — D649 Anemia, unspecified: Secondary | ICD-10-CM | POA: Diagnosis not present

## 2023-12-28 NOTE — Progress Notes (Signed)
Subjective: Chief Complaint  Patient presents with   Foot Ulcer    RM#12 Right foot ulcer follow up patient states doing well.     83 year old female presents the office with above concerns.  She states that she has been doing well.  She was told by her home health nurse that the wound is healed or closed over so she did get wet.  She does not report any drainage or pus.  Denies any fevers or chills.  She has no other concerns today.   Objective: AAO x3, NAD DP/PT pulses palpable bilaterally, CRT less than 3 seconds On the plantar aspect of the toe there is still hyperkeratotic tissue and today there are 2 areas of dried blood under the callus he wanted debrided this area is preulcerative but appears to the wound itself is healed.  There is no edema, erythema or signs of infection.  No open lesions identified at this time.  No pain with calf compression, swelling, warmth, erythema    Assessment: Preulcerative callus right foot; hallux malleus  Plan: -All treatment options discussed with the patient including all alternatives, risks, complications.  -Sharply debrided hyperkeratotic lesion.  There is 2 dried areas of blood present noted in the area is preulcerative as pictured above.  There is no sign of infection continue to monitor.  I want her to continue the dressing changes as she has been doing.  I continue with the orthotic to help offload which is modified last appointment. -Daily foot inspection.  Monitoring signs or symptoms of infection or skin breakdown.  Return in about 3 weeks (around 01/15/2024) for toe ulcer.  Vivi Barrack DPM

## 2023-12-29 DIAGNOSIS — E11621 Type 2 diabetes mellitus with foot ulcer: Secondary | ICD-10-CM | POA: Diagnosis not present

## 2023-12-29 DIAGNOSIS — K5900 Constipation, unspecified: Secondary | ICD-10-CM | POA: Diagnosis not present

## 2023-12-29 DIAGNOSIS — I509 Heart failure, unspecified: Secondary | ICD-10-CM | POA: Diagnosis not present

## 2023-12-29 DIAGNOSIS — L97512 Non-pressure chronic ulcer of other part of right foot with fat layer exposed: Secondary | ICD-10-CM | POA: Diagnosis not present

## 2023-12-29 DIAGNOSIS — Z7901 Long term (current) use of anticoagulants: Secondary | ICD-10-CM | POA: Diagnosis not present

## 2023-12-29 DIAGNOSIS — F419 Anxiety disorder, unspecified: Secondary | ICD-10-CM | POA: Diagnosis not present

## 2023-12-29 DIAGNOSIS — D649 Anemia, unspecified: Secondary | ICD-10-CM | POA: Diagnosis not present

## 2023-12-29 DIAGNOSIS — I890 Lymphedema, not elsewhere classified: Secondary | ICD-10-CM | POA: Diagnosis not present

## 2023-12-29 DIAGNOSIS — I872 Venous insufficiency (chronic) (peripheral): Secondary | ICD-10-CM | POA: Diagnosis not present

## 2023-12-30 ENCOUNTER — Ambulatory Visit: Payer: Medicare HMO | Admitting: Nurse Practitioner

## 2023-12-30 ENCOUNTER — Telehealth: Payer: Self-pay | Admitting: Nurse Practitioner

## 2023-12-30 NOTE — Telephone Encounter (Signed)
Pt states that every morning she feels horrible.  Couldn't specify specific symptoms, but just stated she does not feel good.  Wants to know if could be related to her thyroid.

## 2023-12-30 NOTE — Telephone Encounter (Signed)
Her thyroid levels look good!  They were done on 1/21 and they were both well within the normal range.  It appears she is still on the proper dose of thyroid hormone.  Without knowing what symptoms are affecting her, I really have no way of telling if it is thyroid related.  At the very least, I would recommend she follow up with her PCP regarding acute concerns.

## 2023-12-30 NOTE — Telephone Encounter (Signed)
Patient called and a message was left.

## 2023-12-30 NOTE — Telephone Encounter (Signed)
Can you call her and relay this to her?  She may have more questions regarding symptoms of her thyroid.

## 2023-12-31 DIAGNOSIS — I872 Venous insufficiency (chronic) (peripheral): Secondary | ICD-10-CM | POA: Diagnosis not present

## 2023-12-31 DIAGNOSIS — D649 Anemia, unspecified: Secondary | ICD-10-CM | POA: Diagnosis not present

## 2023-12-31 DIAGNOSIS — I890 Lymphedema, not elsewhere classified: Secondary | ICD-10-CM | POA: Diagnosis not present

## 2023-12-31 DIAGNOSIS — E11621 Type 2 diabetes mellitus with foot ulcer: Secondary | ICD-10-CM | POA: Diagnosis not present

## 2023-12-31 DIAGNOSIS — K5900 Constipation, unspecified: Secondary | ICD-10-CM | POA: Diagnosis not present

## 2023-12-31 DIAGNOSIS — Z7901 Long term (current) use of anticoagulants: Secondary | ICD-10-CM | POA: Diagnosis not present

## 2023-12-31 DIAGNOSIS — I509 Heart failure, unspecified: Secondary | ICD-10-CM | POA: Diagnosis not present

## 2023-12-31 DIAGNOSIS — L97512 Non-pressure chronic ulcer of other part of right foot with fat layer exposed: Secondary | ICD-10-CM | POA: Diagnosis not present

## 2023-12-31 DIAGNOSIS — F419 Anxiety disorder, unspecified: Secondary | ICD-10-CM | POA: Diagnosis not present

## 2024-01-01 ENCOUNTER — Encounter: Payer: Self-pay | Admitting: Internal Medicine

## 2024-01-09 ENCOUNTER — Encounter: Payer: Self-pay | Admitting: Allergy & Immunology

## 2024-01-09 ENCOUNTER — Ambulatory Visit: Payer: Medicare HMO | Admitting: Allergy & Immunology

## 2024-01-09 ENCOUNTER — Telehealth: Payer: Self-pay

## 2024-01-09 ENCOUNTER — Other Ambulatory Visit: Payer: Self-pay

## 2024-01-09 VITALS — HR 96 | Temp 97.9°F | Resp 16 | Ht 64.0 in | Wt 151.2 lb

## 2024-01-09 DIAGNOSIS — J302 Other seasonal allergic rhinitis: Secondary | ICD-10-CM

## 2024-01-09 DIAGNOSIS — J3089 Other allergic rhinitis: Secondary | ICD-10-CM

## 2024-01-09 DIAGNOSIS — F419 Anxiety disorder, unspecified: Secondary | ICD-10-CM | POA: Diagnosis not present

## 2024-01-09 DIAGNOSIS — J4540 Moderate persistent asthma, uncomplicated: Secondary | ICD-10-CM

## 2024-01-09 DIAGNOSIS — Z7901 Long term (current) use of anticoagulants: Secondary | ICD-10-CM | POA: Diagnosis not present

## 2024-01-09 DIAGNOSIS — E11621 Type 2 diabetes mellitus with foot ulcer: Secondary | ICD-10-CM | POA: Diagnosis not present

## 2024-01-09 DIAGNOSIS — I872 Venous insufficiency (chronic) (peripheral): Secondary | ICD-10-CM | POA: Diagnosis not present

## 2024-01-09 DIAGNOSIS — K5900 Constipation, unspecified: Secondary | ICD-10-CM | POA: Diagnosis not present

## 2024-01-09 DIAGNOSIS — B999 Unspecified infectious disease: Secondary | ICD-10-CM

## 2024-01-09 DIAGNOSIS — D649 Anemia, unspecified: Secondary | ICD-10-CM | POA: Diagnosis not present

## 2024-01-09 DIAGNOSIS — L97512 Non-pressure chronic ulcer of other part of right foot with fat layer exposed: Secondary | ICD-10-CM | POA: Diagnosis not present

## 2024-01-09 DIAGNOSIS — I509 Heart failure, unspecified: Secondary | ICD-10-CM | POA: Diagnosis not present

## 2024-01-09 DIAGNOSIS — I890 Lymphedema, not elsewhere classified: Secondary | ICD-10-CM | POA: Diagnosis not present

## 2024-01-09 MED ORDER — BREZTRI AEROSPHERE 160-9-4.8 MCG/ACT IN AERO: 2.0000 | INHALATION_SPRAY | Freq: Every day | RESPIRATORY_TRACT | 11 refills | Status: AC

## 2024-01-09 MED ORDER — BREZTRI AEROSPHERE 160-9-4.8 MCG/ACT IN AERO
2.0000 | INHALATION_SPRAY | Freq: Every day | RESPIRATORY_TRACT | 11 refills | Status: DC
Start: 2024-01-09 — End: 2024-01-09

## 2024-01-09 NOTE — Progress Notes (Signed)
 FOLLOW UP  Date of Service/Encounter:  01/09/24   Assessment:   Mild persistent asthma without complication   Seasonal and perennial allergic rhinitis (grasses, indoor molds, outdoor molds, dust mites, and cockroach) - would likely benefit from allergen immunotherapy, although her duties at home taking care of her husband likely keep that from being an effective or easy treatment at this point in time   Remote history of smoking    Social stressors at home - husband with Alzheimers    Recent diagnosis of atrial fibrillation  Plan/Recommendations:   1. Mild persistent asthma without complication - Lung testing looked amazing today. - We are going to change you to Breztri  two puffs once daily. - This contains Symbicort  plus a third medication to relax your lung muscles. - [place] filled out to provide free inhalers (hopefully). - Spacer use reviewed. - Daily controller medication(s): Breztri  two puffs once daily - Prior to physical activity: albuterol  2 puffs 10-15 minutes before physical activity. - Rescue medications: albuterol  4 puffs every 4-6 hours as needed - Asthma control goals:  * Full participation in all desired activities (may need albuterol  before activity) * Albuterol  use two time or less a week on average (not counting use with activity) * Cough interfering with sleep two time or less a month * Oral steroids no more than once a year * No hospitalizations  2. Seasonal and perennial allergic rhinitis - well controlled on the nose sprays only - Previous testing showed: grasses, indoor molds, outdoor molds, dust mites, and cockroach - Continue with:   Flonase  (fluticasone ) one spray per nostril one to two times daily Astelin  (azelastine ) 2 sprays per nostril one to two times daily   - IF NEEDED FOR RUNNY NOSE: Atrovent  (ipratropium) 2 sprays in each nostril EVERY 8 hours as needed - Consider allergy  shots as a means of long-term control. - Allergy  shots  re-train and reset the immune system to ignore environmental allergens and decrease the resulting immune response to those allergens (sneezing, itchy watery eyes, runny nose, nasal congestion, etc).    - Allergy  shots improve symptoms in 75-85% of patients.   3. Recurrent infections  - Immune work up was completely normal.  - We do not need to do any further workup at this time.   4. Return in about 6 months (around 07/08/2024).    Subjective:   KEMESHA MOSEY is a 83 y.o. female presenting today for follow up of  Chief Complaint  Patient presents with   Allergic Rhinitis     Congestion in the mornings - flonase  helps    Asthma    2 mornings out of a week has to use rescue inhaler     [place] SHAUNNA Browning has a history of the following: Patient Active Problem List   Diagnosis Date Noted   ERRONEOUS ENCOUNTER--DISREGARD 10/16/2023   Chronic diastolic heart failure (HCC) 08/15/2023   Chest pain of uncertain etiology 05/19/2023   Aortic regurgitation 05/19/2023   Paroxysmal atrial flutter (HCC) 02/20/2023   Seasonal and perennial allergic rhinitis 10/16/2022   Allergic rhinitis 09/10/2022   External hemorrhoids 08/22/2022   Neuropathy 07/30/2022   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/2022   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   Osteoarthritis of right knee 02/21/2022   Increased frequency of urination 02/20/2022   Intermittent confusion 02/20/2022   Urinary incontinence 02/20/2022   Posterior rhinorrhea 01/04/2022   Fatigue 12/24/2021   Impairment of balance 09/20/2021   Chronic insomnia 06/20/2021  Chronic low back pain 06/20/2021   Edema of lower extremity 06/20/2021   Gastroesophageal reflux disease without esophagitis 06/20/2021   Hypothyroidism 06/20/2021   Major depressive disorder 06/20/2021   Osteopenia 06/20/2021   Mixed hyperlipidemia 06/15/2021   Type 2 diabetes mellitus (HCC) 06/15/2021   Gastritis and gastroduodenitis 06/14/2021   Mixed anxiety and depressive  disorder 06/01/2021   Gastric ulcer 04/13/2021   Abdominal pain 03/15/2021   Fracture of femoral neck, right (HCC) 01/31/2021   Closed displaced fracture of right femoral neck (HCC) 01/31/2021   Indigestion 11/02/2020   Change in stool caliber 11/02/2020   Loss of weight 08/23/2020   Nausea without vomiting 06/15/2020   Tick bite 04/10/2020   Medication refill 04/10/2020   Acquired trigger finger 03/28/2020   Pain in right knee 10/08/2019   Cellulitis, leg 08/10/2019   Cellulitis 08/08/2019   Fever 08/08/2019   Cellulitis of foot 08/08/2019   Hypokalemia 08/08/2019   Hyponatremia 08/08/2019   Essential hypertension 08/08/2019   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   Adverse food reaction 09/24/2018   Mild persistent asthma without complication 09/24/2018   Pain of left hip joint 07/17/2018   DOE (dyspnea on exertion) 01/24/2017   Cough variant asthma  vs UACS 01/24/2017   High risk medication use 09/25/2016   Varicose veins of bilateral lower extremities with other complications 03/07/2014   Varicose veins 02/15/2014   Vertigo 11/23/2013   Pain in limb 10/04/2013   Hip bursitis 02/11/2013   Constipation 02/16/2009   RECTAL BLEEDING 02/16/2009   History of cardiovascular disorder 01/29/2008    History obtained from: chart review and patient.  Discussed the use of AI scribe software for clinical note transcription with the patient and/or guardian, who gave verbal consent to proceed.  Rosell is a 83 y.o. female presenting for a follow up visit. She was last seen in October 2024. At that time, lung testing looked good. We continued with the Symbicort  two puffs BID. We talked about starting Tezspire. For her rhinitis, we continue with Flonase  and Astelin . She also has Atrovent  to use as needed for rhinorrhea. Previous workup for recurrent infections was normal.   Since last visit, she has been pretty good.  Asthma/Respiratory Symptom History: She remains on Breztri  2 puffs  once daily.  She pays $40 a month for this.  She has not been getting as much more cheaply if we can get that.  She does have albuterol , but has not been using it on a daily basis.  She has not been on prednisone .  She has not been to the emergency room for her breathing.  We have talked about starting Tezspire in the past, but she wants to discontinue with what she is doing.  Allergic Rhinitis Symptom History: She experiences significant sinus congestion, particularly in the mornings, with 'lots of yellow, dark yellow phlegm' and sinus pain. She uses Flonase  (fluticasone ) nasal spray, administering two squirts in each nostril in the morning, which she finds helpful in alleviating symptoms. She has not been on antibiotics since October and notes that she was previously getting sick frequently. She has had ear pain for the past week, describing it as persistent. She has experienced similar issues in the past and mentions using Q-tips to manage ear discomfort, especially after washing her hair, despite being advised against it. No recent antibiotic use is noted.  Her social history includes being part of a book club and having a supportive community. She is actively involved in the care of  her husband, who has significant health issues, including the use of a catheter and bowel movement irregularities. She has a structured routine with caregivers assisting her husband, allowing her to manage her own health needs.  She reports that he is about the same as he was around 3 months ago.  They have been married 63 years this year.   Otherwise, there have been no changes to her past medical history, surgical history, family history, or social history.    Review of systems otherwise negative other than that mentioned in the HPI.    Objective:   Pulse 96, temperature 97.9 F (36.6 C), temperature source Temporal, resp. rate 16, height 5' 4 (1.626 m), weight 151 lb 3.2 oz (68.6 kg), SpO2 93%. Body mass index  is 25.95 kg/m.    Physical Exam Vitals reviewed.  Constitutional:      Appearance: Normal appearance. She is well-developed. She is not ill-appearing or toxic-appearing.     Comments: Lovely and talkative.   HENT:     Head: Normocephalic and atraumatic.     Right Ear: Tympanic membrane, ear canal and external ear normal.     Left Ear: Tympanic membrane, ear canal and external ear normal.     Nose: No nasal deformity, septal deviation, mucosal edema, congestion or rhinorrhea.     Right Turbinates: Enlarged, swollen and pale.     Left Turbinates: Enlarged, swollen and pale.     Right Sinus: No maxillary sinus tenderness or frontal sinus tenderness.     Left Sinus: No maxillary sinus tenderness or frontal sinus tenderness.     Comments: No nasal polyps noted.     Mouth/Throat:     Lips: Pink.     Mouth: Mucous membranes are moist. Mucous membranes are not pale and not dry.     Pharynx: Uvula midline.  Eyes:     General: Lids are normal. No allergic shiner.       Right eye: No discharge.        Left eye: No discharge.     Conjunctiva/sclera: Conjunctivae normal.     Right eye: Right conjunctiva is not injected. No chemosis.    Left eye: Left conjunctiva is not injected. No chemosis.    Pupils: Pupils are equal, round, and reactive to light.  Cardiovascular:     Rate and Rhythm: Normal rate and regular rhythm.     Heart sounds: Normal heart sounds.  Pulmonary:     Effort: Pulmonary effort is normal. No tachypnea, accessory muscle usage or respiratory distress.     Breath sounds: Normal breath sounds. No wheezing, rhonchi or rales.  Chest:     Chest wall: No tenderness.  Lymphadenopathy:     Cervical: No cervical adenopathy.  Skin:    Coloration: Skin is not pale.     Findings: No abrasion, erythema, petechiae or rash. Rash is not papular, urticarial or vesicular.  Neurological:     Mental Status: She is alert.  Psychiatric:        Behavior: Behavior is cooperative.       Diagnostic studies: none       Marty Shaggy, MD  [facility] 

## 2024-01-09 NOTE — Telephone Encounter (Signed)
 Patient filled out Breztri  [place] form in the office. It has been faxed to [phone]. Form has been faxed along with printed prescription and insurance card. Forms have been placed in the red accordion folder while a response is sent.

## 2024-01-09 NOTE — Patient Instructions (Addendum)
 1. Mild persistent asthma without complication - We are going to change you to Breztri  two puffs once daily. - This contains Symbicort  plus a third medication to relax your lung muscles. - [place] filled out to provide free inhalers (hopefully). - Spacer use reviewed. - Daily controller medication(s): Breztri  two puffs once daily - Prior to physical activity: albuterol  2 puffs 10-15 minutes before physical activity. - Rescue medications: albuterol  4 puffs every 4-6 hours as needed - Asthma control goals:  * Full participation in all desired activities (may need albuterol  before activity) * Albuterol  use two time or less a week on average (not counting use with activity) * Cough interfering with sleep two time or less a month * Oral steroids no more than once a year * No hospitalizations  2. Seasonal and perennial allergic rhinitis - well controlled on the nose sprays only - Previous testing showed: grasses, indoor molds, outdoor molds, dust mites, and cockroach - Continue with:   Flonase  (fluticasone ) one spray per nostril one to two times daily Astelin  (azelastine ) 2 sprays per nostril one to two times daily   - IF NEEDED FOR RUNNY NOSE: Atrovent  (ipratropium) 2 sprays in each nostril EVERY 8 hours as needed - Consider allergy  shots as a means of long-term control. - Allergy  shots re-train and reset the immune system to ignore environmental allergens and decrease the resulting immune response to those allergens (sneezing, itchy watery eyes, runny nose, nasal congestion, etc).    - Allergy  shots improve symptoms in 75-85% of patients.   3. Recurrent infections  - Immune work up was completely normal.  - We do not need to do any further workup at this time.   4. Return in about 6 months (around 07/08/2024).    Please inform us  of any Emergency Department visits, hospitalizations, or changes in symptoms. Call us  before going to the ED for breathing or allergy  symptoms since  we might be able to fit you in for a sick visit. Feel free to contact us  anytime with any questions, problems, or concerns.  It was a pleasure to see you today!   Websites that have reliable patient information: 1. American Academy of Asthma, Allergy , and Immunology: www.aaaai.org 2. Food Psychologist, Counselling and Education (FARE): foodallergy.org 3. Mothers of Asthmatics: http://www.asthmacommunitynetwork.org 4. [employer] of Allergy , Asthma, and Immunology: www.acaai.org   COVID-19 Vaccine Information can be found at: podexchange.nl For questions related to vaccine distribution or appointments, please email [email or web address] or call [phone].   We realize that you might be concerned about having an allergic reaction to the COVID19 vaccines. To help with that concern, WE ARE OFFERING THE COVID19 VACCINES IN OUR OFFICE! Ask the front desk for dates!     "Like" us  on Facebook and Instagram for our latest updates!      A healthy democracy works best when [employer] participate! Make sure you are registered to vote! If you have moved or changed any of your contact information, you will need to get this updated before voting!  In some cases, you MAY be able to register to vote online: [email or web address]

## 2024-01-12 DIAGNOSIS — Z7901 Long term (current) use of anticoagulants: Secondary | ICD-10-CM | POA: Diagnosis not present

## 2024-01-12 DIAGNOSIS — I509 Heart failure, unspecified: Secondary | ICD-10-CM | POA: Diagnosis not present

## 2024-01-12 DIAGNOSIS — E11621 Type 2 diabetes mellitus with foot ulcer: Secondary | ICD-10-CM | POA: Diagnosis not present

## 2024-01-12 DIAGNOSIS — D649 Anemia, unspecified: Secondary | ICD-10-CM | POA: Diagnosis not present

## 2024-01-12 DIAGNOSIS — L97512 Non-pressure chronic ulcer of other part of right foot with fat layer exposed: Secondary | ICD-10-CM | POA: Diagnosis not present

## 2024-01-12 DIAGNOSIS — F419 Anxiety disorder, unspecified: Secondary | ICD-10-CM | POA: Diagnosis not present

## 2024-01-12 DIAGNOSIS — K5900 Constipation, unspecified: Secondary | ICD-10-CM | POA: Diagnosis not present

## 2024-01-12 DIAGNOSIS — I890 Lymphedema, not elsewhere classified: Secondary | ICD-10-CM | POA: Diagnosis not present

## 2024-01-12 DIAGNOSIS — I872 Venous insufficiency (chronic) (peripheral): Secondary | ICD-10-CM | POA: Diagnosis not present

## 2024-01-12 NOTE — Telephone Encounter (Addendum)
 Fax received from [facility] & ME informing that Breztri  has been approved. A copy has been placed to be mailed out to the address on file. I called patient and informed. Patient informed me that she has been having left ear pain for over a week now. She said her ears were clear when Dr.Gallagher looked at her ears on Friday. The ear pain has gotten worse over the weekend. She has been coughing up yellow phlegm and having facial pain. Has been using her nasal sprays and has been doing saline rinses. I informed I would send message to Dr.Patel as Dr.Gallagher is out of the office. Best Pharmacy: [facility]

## 2024-01-12 NOTE — Telephone Encounter (Signed)
 I called patient and informed of message.

## 2024-01-15 ENCOUNTER — Ambulatory Visit: Payer: Medicare HMO | Admitting: Podiatry

## 2024-01-15 ENCOUNTER — Encounter: Payer: Self-pay | Admitting: Podiatry

## 2024-01-15 DIAGNOSIS — L97511 Non-pressure chronic ulcer of other part of right foot limited to breakdown of skin: Secondary | ICD-10-CM | POA: Diagnosis not present

## 2024-01-15 NOTE — Progress Notes (Signed)
Subjective: Chief Complaint  Patient presents with   Foot Ulcer    RM#13 Right foot big toe ulcer follow up care patient has no other concerns today.     83 year old female presents the office with above concerns.  States that she does get some pain to the area.  She has a home health care nurse that still comes out twice a week to change the bandage with the collagen.  She states that if she does not wear her shoes, inserts when she gets.  She denies any drainage or pus or increasing swelling or redness.  No fevers or chills.  No other concerns.    Objective: AAO x3, NAD DP/PT pulses palpable bilaterally, CRT less than 3 seconds On the plantar aspect of the toe there is still hyperkeratotic tissue and today there are 2 areas of dried blood under the callus which is pictured below.  Along the more plantar wound there is a very superficial pinpoint type opening but there is no probing to [place] or tunneling.  There is no surrounding erythema, ascending cellulitis.  No fluctuation, crepitation, malodor.  Hallux malleus is present. No pain with calf compression, swelling, warmth, erythema    Assessment: Ulceration right hallux  Plan: -All treatment options discussed with the patient including all alternatives, risks, complications.  -Sharply debrided hyperkeratotic lesion which was quite thick today revealed underlying small superficial wound which is present and pictured above.  I debrided to healthy tissue.  Clean the area with saline.  Prisma was applied followed by dressing.  Continue offloading.  She has home health.  Iconic about the house and scheduled to come back tomorrow.  She is to monitor closely for any signs or symptoms of infection or any worsening of the wound going immediately should any occur.  Return in about 4 weeks (around 02/12/2024) for toe ulcer .  [name] DPM

## 2024-01-16 ENCOUNTER — Ambulatory Visit: Payer: Medicare HMO | Admitting: Podiatry

## 2024-01-16 DIAGNOSIS — D649 Anemia, unspecified: Secondary | ICD-10-CM | POA: Diagnosis not present

## 2024-01-16 DIAGNOSIS — Z7901 Long term (current) use of anticoagulants: Secondary | ICD-10-CM | POA: Diagnosis not present

## 2024-01-16 DIAGNOSIS — L97512 Non-pressure chronic ulcer of other part of right foot with fat layer exposed: Secondary | ICD-10-CM | POA: Diagnosis not present

## 2024-01-16 DIAGNOSIS — I890 Lymphedema, not elsewhere classified: Secondary | ICD-10-CM | POA: Diagnosis not present

## 2024-01-16 DIAGNOSIS — F419 Anxiety disorder, unspecified: Secondary | ICD-10-CM | POA: Diagnosis not present

## 2024-01-16 DIAGNOSIS — I509 Heart failure, unspecified: Secondary | ICD-10-CM | POA: Diagnosis not present

## 2024-01-16 DIAGNOSIS — I872 Venous insufficiency (chronic) (peripheral): Secondary | ICD-10-CM | POA: Diagnosis not present

## 2024-01-16 DIAGNOSIS — E11621 Type 2 diabetes mellitus with foot ulcer: Secondary | ICD-10-CM | POA: Diagnosis not present

## 2024-01-16 DIAGNOSIS — K5900 Constipation, unspecified: Secondary | ICD-10-CM | POA: Diagnosis not present

## 2024-01-20 ENCOUNTER — Telehealth: Payer: Self-pay | Admitting: Gastroenterology

## 2024-01-20 ENCOUNTER — Encounter: Payer: Self-pay | Admitting: Gastroenterology

## 2024-01-20 ENCOUNTER — Ambulatory Visit: Payer: Medicare HMO | Admitting: Gastroenterology

## 2024-01-20 VITALS — BP 116/75 | HR 80 | Temp 97.4°F | Ht 64.0 in | Wt 152.8 lb

## 2024-01-20 DIAGNOSIS — D649 Anemia, unspecified: Secondary | ICD-10-CM

## 2024-01-20 DIAGNOSIS — E11621 Type 2 diabetes mellitus with foot ulcer: Secondary | ICD-10-CM | POA: Diagnosis not present

## 2024-01-20 DIAGNOSIS — K5904 Chronic idiopathic constipation: Secondary | ICD-10-CM

## 2024-01-20 DIAGNOSIS — F419 Anxiety disorder, unspecified: Secondary | ICD-10-CM | POA: Diagnosis not present

## 2024-01-20 DIAGNOSIS — Z7901 Long term (current) use of anticoagulants: Secondary | ICD-10-CM | POA: Diagnosis not present

## 2024-01-20 DIAGNOSIS — K219 Gastro-esophageal reflux disease without esophagitis: Secondary | ICD-10-CM

## 2024-01-20 DIAGNOSIS — L97512 Non-pressure chronic ulcer of other part of right foot with fat layer exposed: Secondary | ICD-10-CM | POA: Diagnosis not present

## 2024-01-20 DIAGNOSIS — I890 Lymphedema, not elsewhere classified: Secondary | ICD-10-CM | POA: Diagnosis not present

## 2024-01-20 DIAGNOSIS — K5900 Constipation, unspecified: Secondary | ICD-10-CM | POA: Diagnosis not present

## 2024-01-20 DIAGNOSIS — I509 Heart failure, unspecified: Secondary | ICD-10-CM | POA: Diagnosis not present

## 2024-01-20 DIAGNOSIS — I872 Venous insufficiency (chronic) (peripheral): Secondary | ICD-10-CM | POA: Diagnosis not present

## 2024-01-20 NOTE — Progress Notes (Signed)
GI Office Note    Referring Provider: Benita Stabile, MD Primary Care Physician:  Benita Stabile, MD  Primary Gastroenterologist: Roetta Sessions, MD   Chief Complaint   Chief Complaint  Patient presents with   Follow-up    Wants to make sure it is still ok to take miralax every day.     History of Present Illness   [name] is a 83 y.o. female presenting today for follow up. Last seen 01/2023.  H/o GERD, gastric ulcers, constipation  Today:  Reflux well-controlled on pantoprazole 40 mg daily before breakfast.  She takes famotidine in the evenings, ordered by Dr. Sherene Sires.  Denies any abdominal pain.  Appetite is good.  Under a lot of stress, with care of her husband who has Alzheimer's.  Moving bowels daily, often has small pencil thin soft stools every time she urinates.  Sometimes passes more than at others.  Denies any melena or rectal bleeding.  No rectal pain.  No dysphagia.  EGD 05/2021: -gastritis (reactive gastropathy, no H.pylori)   EGD 12/2020:  -erosive reflux esophagitis -nonbleeding gastric ulcers, biopsy showed erosion/ulceration, negative for H. Pylori   Colonoscopy January 2022: -Diverticulosis   CT abdomen pelvis February 2022 with subacute impaction of the right subcapital femoral neck, diverticulosis without diverticulitis, absent gallbladder.  Patient was referred to orthopedic.  She had a hip replacement and was told by Ortho that her pain had nothing to do with her hip replacement.   CT abdomen pelvis March 15, 2021 after being referred to the ED from our office for abdominal pain, stable intra and extrahepatic biliary duct dilation likely related to prior cholecystectomy, postsurgical changes seen from interval right hip arthroplasty.  Postoperative seroma anterior to the right hip prosthesis measuring 5.1 x 2.8 x 6.6 cm.   Medications   Current Outpatient Medications  Medication Sig Dispense Refill   acetaminophen (TYLENOL) 500 MG tablet Take  500-1,000 mg by mouth as needed for mild pain or moderate pain.     albuterol (VENTOLIN HFA) 108 (90 Base) MCG/ACT inhaler Inhale 2 puffs into the lungs every 6 (six) hours as needed for wheezing or shortness of breath. 8 g 1   ALPRAZolam (XANAX) 0.5 MG tablet Take 0.5 mg by mouth in the morning, at noon, and at bedtime. (1000, 1500, AT BEDTIME)     amoxicillin (AMOXIL) 500 MG capsule      apixaban (ELIQUIS) 5 MG TABS tablet Take 1 tablet (5 mg total) by mouth 2 (two) times daily. 60 tablet 11   azelastine (ASTELIN) 0.1 % nasal spray Place 1 spray into both nostrils 2 (two) times daily. 30 mL 5   Budeson-Glycopyrrol-Formoterol (BREZTRI AEROSPHERE) 160-9-4.8 MCG/ACT AERO Inhale 2 puffs into the lungs daily. 10.7 g 11   clotrimazole-betamethasone (LOTRISONE) cream Apply 1 Application topically daily. 30 g 0   diclofenac Sodium (VOLTAREN) 1 % GEL Apply 1 application topically as needed (back pain).     docusate sodium (COLACE) 100 MG capsule Take 100 mg by mouth in the morning.     famotidine (PEPCID) 20 MG tablet TAKE 1 TABLET BY MOUTH DAILY AFTER SUPPER 30 tablet 5   fluocinonide gel (LIDEX) 0.05 % Apply 1 Application topically 2 (two) times daily.     fluticasone (FLONASE) 50 MCG/ACT nasal spray Place 2 sprays into both nostrils in the morning. 16 g 5   furosemide (LASIX) 40 MG tablet TAKE ONE TABLET BY MOUTH EVERY DAY. MAY TAKE ADDITIONAL TABLET AS NEEDED  IF SHORT OF BREATH CONTINUES 90 tablet 1   ipratropium (ATROVENT) 0.06 % nasal spray Place 2 sprays into both nostrils 4 (four) times daily as needed for rhinitis. 15 mL 5   levothyroxine (SYNTHROID) 50 MCG tablet Take 1 tablet (50 mcg total) by mouth daily before breakfast. 90 tablet 3   lidocaine (XYLOCAINE) 2 % solution Use as directed 5 mLs in the mouth or throat as needed for mouth pain. Gargle and spit 5mL up to three times daily as needed for throat pain. 100 mL 0   losartan (COZAAR) 50 MG tablet Take 50 mg by mouth 2 (two) times daily.      Multiple Vitamin (MULTIVITAMIN WITH MINERALS) TABS tablet Take 1 tablet by mouth daily in the afternoon.     mupirocin ointment (BACTROBAN) 2 % APPLY TO AFFECTED AREA DAILY 22 g 2   mupirocin ointment (BACTROBAN) 2 % Apply 1 Application topically 2 (two) times daily. 30 g 2   NONFORMULARY OR COMPOUNDED ITEM Apply 1 Application topically 3 (three) times daily as needed (nerve pain.). Baclofen 2%/Diclofenac Sodium 3%/Gabapentin 5%/Lidocaine HCl 5%/Menthol 1% Topical Gel     pantoprazole (PROTONIX) 40 MG tablet TAKE 1 TABLET BY MOUTH ONCE A DAY. 30 tablet 11   Polyethyl Glycol-Propyl Glycol (LUBRICANT EYE DROPS) 0.4-0.3 % SOLN Place 1-2 drops into both eyes 3 (three) times daily as needed (dry/irritated eyes.).     polyethylene glycol (MIRALAX / GLYCOLAX) 17 g packet Take 17 g by mouth every evening.     potassium chloride (KLOR-CON) 10 MEQ tablet Take 2 tablets (20 mEq total) by mouth daily. 90 tablet 3   raloxifene (EVISTA) 60 MG tablet Take 1 tablet (60 mg total) by mouth every morning. (Patient taking differently: Take 60 mg by mouth every other day. IN THE MORNING) 30 tablet 12   SODIUM FLUORIDE 5000 PPM 1.1 % PSTE Take by mouth at bedtime.     venlafaxine XR (EFFEXOR-XR) 75 MG 24 hr capsule Take 75 mg by mouth daily after breakfast.     vitamin D3 (CHOLECALCIFEROL) 25 MCG tablet Take 1,000 Units by mouth daily.     ZINC CITRATE PO Take 50 mg by mouth every evening.     No current facility-administered medications for this visit.    Allergies   Allergies as of 01/20/2024 - Review Complete 01/20/2024  Allergen Reaction Noted   Amoxicillin-pot clavulanate Nausea And Vomiting, Other (See Comments), Itching, and Nausea Only 10/04/2013   Ace inhibitors Cough    Guaifenesin Hives and Other (See Comments) 08/01/2022   Lisinopril Cough and Other (See Comments) 08/01/2022   Cetirizine Other (See Comments) 03/26/2022   Clarithromycin  01/14/2023   Entex la Itching    Gabapentin Other (See  Comments) 03/26/2022   Levofloxacin Hives and Itching 01/21/2017   Meloxicam Other (See Comments) 04/25/2020        Review of Systems   General: Negative for anorexia, weight loss, fever, chills,+ fatigue, weakness. ENT: Negative for hoarseness, difficulty swallowing , nasal congestion. CV: Negative for chest pain, angina, palpitations, dyspnea on exertion, peripheral edema.  Respiratory: Negative for dyspnea at rest, dyspnea on exertion, cough, sputum, wheezing.  GI: See history of present illness. GU:  Negative for dysuria, hematuria, urinary incontinence, urinary frequency, nocturnal urination.  Endo: Negative for unusual weight change.     Physical Exam   BP 116/75 (BP Location: Right Arm, Patient Position: Sitting, Cuff Size: Normal)   Pulse 80   Temp (!) 97.4 F (36.3 C) (Oral)  Ht 5\' 4"  (1.626 m)   Wt 152 lb 12.8 oz (69.3 kg)   SpO2 97%   BMI 26.23 kg/m    General: Well-nourished, well-developed in no acute distress.  Eyes: No icterus. Mouth: Oropharyngeal mucosa moist and pink  Abdomen: Bowel sounds are normal, nontender, nondistended, no hepatosplenomegaly or masses,  no abdominal bruits or hernia , no rebound or guarding.  Rectal: not performed  Extremities: trace bilateral lower extremity edema. No clubbing or deformities. Neuro: Alert and oriented x 4   Skin: Warm and dry, no jaundice.   Psych: Alert and cooperative, normal mood and affect.  Labs   Lab Results  Component Value Date   TSH 3.780 12/23/2023   Lab Results  Component Value Date   NA 135 11/11/2023   CL 101 11/11/2023   K 3.5 11/11/2023   CO2 25 11/11/2023   BUN 18 11/11/2023   CREATININE 1.06 (H) 11/11/2023   GFRNONAA 53 (L) 11/11/2023   CALCIUM 8.8 (L) 11/11/2023   ALBUMIN 4.1 02/20/2023   GLUCOSE 93 11/11/2023   Lab Results  Component Value Date   ALT 18 02/20/2023   AST 24 02/20/2023   ALKPHOS 74 02/20/2023   BILITOT 0.8 02/20/2023   Lab Results  Component Value Date    WBC 9.7 07/21/2023   HGB 10.3 (L) 07/21/2023   HCT 31.2 (L) 07/21/2023   MCV 94 07/21/2023   PLT 227 07/21/2023   Lab Results  Component Value Date   TSH 3.780 12/23/2023   Lab Results  Component Value Date   VITAMINB12 730 09/24/2018   Lab Results  Component Value Date   FOLATE >20.0 09/24/2018     Imaging Studies   No results found.  Assessment/Plan:   GERD: -doing well -continue pantoprazole 40 mg daily before breakfast -Reinforced antireflux measures  Constipation: -Passes stool every time she urinates, small amounts but frequently -Will try cutting back on MiraLAX to half a capful daily to every other day to see if she can still manage her constipation but decrease stool frequency -She will keep me posted on progress -return ov in one year or sooner if needed  Chronic normocytic anemia: -Hemoglobin 10.3 back in August, has not been rechecked since that time -complains of fatigue -Will update CBC, B12, folate, iron/TIBC/ferritin       Leanna Battles. Melvyn Neth, MHS, PA-C [facility]

## 2024-01-20 NOTE — Telephone Encounter (Signed)
[name], looking at prior labs, patient has chronic anemia. She has significant fatigue. Last CBC in 07/2023  Let's offer to update CBC, iron/tibc/ferritin, b12, folate

## 2024-01-20 NOTE — Patient Instructions (Signed)
Try cutting back on your miralax to 1/2 capful daily or every other day. If you feel like you are still having too many stools, please let me know.  Continue pantoprazole 40mg  daily before breakfast. Return to the office in one year or call sooner if needed.

## 2024-01-21 ENCOUNTER — Other Ambulatory Visit: Payer: Self-pay

## 2024-01-21 DIAGNOSIS — D509 Iron deficiency anemia, unspecified: Secondary | ICD-10-CM

## 2024-01-21 NOTE — Telephone Encounter (Signed)
Pt was made aware, labs were ordered and pt was instructed to have completed as soon as possible. Pt verbalized understanding.

## 2024-01-22 DIAGNOSIS — D509 Iron deficiency anemia, unspecified: Secondary | ICD-10-CM | POA: Diagnosis not present

## 2024-01-22 DIAGNOSIS — E039 Hypothyroidism, unspecified: Secondary | ICD-10-CM | POA: Diagnosis not present

## 2024-01-23 DIAGNOSIS — I509 Heart failure, unspecified: Secondary | ICD-10-CM | POA: Diagnosis not present

## 2024-01-23 DIAGNOSIS — I890 Lymphedema, not elsewhere classified: Secondary | ICD-10-CM | POA: Diagnosis not present

## 2024-01-23 DIAGNOSIS — I872 Venous insufficiency (chronic) (peripheral): Secondary | ICD-10-CM | POA: Diagnosis not present

## 2024-01-23 DIAGNOSIS — K5900 Constipation, unspecified: Secondary | ICD-10-CM | POA: Diagnosis not present

## 2024-01-23 DIAGNOSIS — E11621 Type 2 diabetes mellitus with foot ulcer: Secondary | ICD-10-CM | POA: Diagnosis not present

## 2024-01-23 DIAGNOSIS — Z7901 Long term (current) use of anticoagulants: Secondary | ICD-10-CM | POA: Diagnosis not present

## 2024-01-23 DIAGNOSIS — L97512 Non-pressure chronic ulcer of other part of right foot with fat layer exposed: Secondary | ICD-10-CM | POA: Diagnosis not present

## 2024-01-23 DIAGNOSIS — D649 Anemia, unspecified: Secondary | ICD-10-CM | POA: Diagnosis not present

## 2024-01-23 DIAGNOSIS — F419 Anxiety disorder, unspecified: Secondary | ICD-10-CM | POA: Diagnosis not present

## 2024-01-23 LAB — B12 AND FOLATE PANEL
Folate: >20 ng/mL (ref >3.0)
Vitamin B-12: 823 pg/mL (ref 232–1245)

## 2024-01-23 LAB — CBC WITH DIFFERENTIAL/PLATELET
Basophils Absolute: 0.1 10*3/uL (ref 0.0–0.2)
Basos: 1 %
EOS (ABSOLUTE): 0.1 10*3/uL (ref 0.0–0.4)
Eos: 1 %
Hematocrit: 33 % — ABNORMAL LOW (ref 34.0–46.6)
Hemoglobin: 10.5 g/dL — ABNORMAL LOW (ref 11.1–15.9)
Immature Grans (Abs): 0 10*3/uL (ref 0.0–0.1)
Immature Granulocytes: 0 %
Lymphocytes Absolute: 1.5 10*3/uL (ref 0.7–3.1)
Lymphs: 18 %
MCH: 30.3 pg (ref 26.6–33.0)
MCHC: 31.8 g/dL (ref 31.5–35.7)
MCV: 95 fL (ref 79–97)
Monocytes Absolute: 0.6 10*3/uL (ref 0.1–0.9)
Monocytes: 7 %
Neutrophils Absolute: 6.2 10*3/uL (ref 1.4–7.0)
Neutrophils: 73 %
Platelets: 233 10*3/uL (ref 150–450)
RBC: 3.47 x10E6/uL — ABNORMAL LOW (ref 3.77–5.28)
RDW: 14 % (ref 11.7–15.4)
WBC: 8.6 10*3/uL (ref 3.4–10.8)

## 2024-01-23 LAB — IRON,TIBC AND FERRITIN PANEL
Ferritin: 123 ng/mL (ref 15–150)
Iron Saturation: 28 % (ref 15–55)
Iron: 89 ug/dL (ref 27–139)
Total Iron Binding Capacity: 314 ug/dL (ref 250–450)
UIBC: 225 ug/dL (ref 118–369)

## 2024-01-23 LAB — TSH: TSH: 2.51 u[IU]/mL (ref 0.450–4.500)

## 2024-01-23 LAB — T4, FREE: Free T4: 1.12 ng/dL (ref 0.82–1.77)

## 2024-01-26 DIAGNOSIS — F419 Anxiety disorder, unspecified: Secondary | ICD-10-CM | POA: Diagnosis not present

## 2024-01-26 DIAGNOSIS — L97512 Non-pressure chronic ulcer of other part of right foot with fat layer exposed: Secondary | ICD-10-CM | POA: Diagnosis not present

## 2024-01-26 DIAGNOSIS — Z7901 Long term (current) use of anticoagulants: Secondary | ICD-10-CM | POA: Diagnosis not present

## 2024-01-26 DIAGNOSIS — I890 Lymphedema, not elsewhere classified: Secondary | ICD-10-CM | POA: Diagnosis not present

## 2024-01-26 DIAGNOSIS — D649 Anemia, unspecified: Secondary | ICD-10-CM | POA: Diagnosis not present

## 2024-01-26 DIAGNOSIS — I509 Heart failure, unspecified: Secondary | ICD-10-CM | POA: Diagnosis not present

## 2024-01-26 DIAGNOSIS — K5900 Constipation, unspecified: Secondary | ICD-10-CM | POA: Diagnosis not present

## 2024-01-26 DIAGNOSIS — E11621 Type 2 diabetes mellitus with foot ulcer: Secondary | ICD-10-CM | POA: Diagnosis not present

## 2024-01-26 DIAGNOSIS — I872 Venous insufficiency (chronic) (peripheral): Secondary | ICD-10-CM | POA: Diagnosis not present

## 2024-01-27 ENCOUNTER — Encounter (INDEPENDENT_AMBULATORY_CARE_PROVIDER_SITE_OTHER): Payer: Self-pay | Admitting: Internal Medicine

## 2024-01-27 DIAGNOSIS — R5383 Other fatigue: Secondary | ICD-10-CM

## 2024-01-28 ENCOUNTER — Other Ambulatory Visit: Payer: Self-pay

## 2024-01-28 DIAGNOSIS — D509 Iron deficiency anemia, unspecified: Secondary | ICD-10-CM

## 2024-01-28 NOTE — Telephone Encounter (Signed)
Please see the MyChart message reply(ies) for my assessment and plan.    This patient gave consent for this Medical Advice Message and is aware that it may result in a bill to [employer], as well as the possibility of receiving a bill for a co-payment or deductible. They are an established patient, but are not seeking medical advice exclusively about a problem treated during an in person or video visit in the last seven days. I did not recommend an in person or video visit within seven days of my reply.    I spent a total of 5 minutes cumulative time within 7 days through [employer].  Blessed Girdner Norton Pastel, MD   [facility]

## 2024-01-30 ENCOUNTER — Ambulatory Visit (INDEPENDENT_AMBULATORY_CARE_PROVIDER_SITE_OTHER): Payer: Medicare HMO | Admitting: Gastroenterology

## 2024-01-30 ENCOUNTER — Other Ambulatory Visit: Payer: Self-pay

## 2024-01-30 DIAGNOSIS — D649 Anemia, unspecified: Secondary | ICD-10-CM | POA: Diagnosis not present

## 2024-01-30 DIAGNOSIS — K5900 Constipation, unspecified: Secondary | ICD-10-CM | POA: Diagnosis not present

## 2024-01-30 DIAGNOSIS — I509 Heart failure, unspecified: Secondary | ICD-10-CM | POA: Diagnosis not present

## 2024-01-30 DIAGNOSIS — F419 Anxiety disorder, unspecified: Secondary | ICD-10-CM | POA: Diagnosis not present

## 2024-01-30 DIAGNOSIS — Z7901 Long term (current) use of anticoagulants: Secondary | ICD-10-CM | POA: Diagnosis not present

## 2024-01-30 DIAGNOSIS — L97512 Non-pressure chronic ulcer of other part of right foot with fat layer exposed: Secondary | ICD-10-CM | POA: Diagnosis not present

## 2024-01-30 DIAGNOSIS — I890 Lymphedema, not elsewhere classified: Secondary | ICD-10-CM | POA: Diagnosis not present

## 2024-01-30 DIAGNOSIS — D509 Iron deficiency anemia, unspecified: Secondary | ICD-10-CM

## 2024-01-30 DIAGNOSIS — I872 Venous insufficiency (chronic) (peripheral): Secondary | ICD-10-CM | POA: Diagnosis not present

## 2024-01-30 DIAGNOSIS — E11621 Type 2 diabetes mellitus with foot ulcer: Secondary | ICD-10-CM | POA: Diagnosis not present

## 2024-01-30 LAB — IFOBT (OCCULT BLOOD): IFOBT: POSITIVE

## 2024-02-03 DIAGNOSIS — L97512 Non-pressure chronic ulcer of other part of right foot with fat layer exposed: Secondary | ICD-10-CM | POA: Diagnosis not present

## 2024-02-04 ENCOUNTER — Telehealth: Payer: Self-pay

## 2024-02-04 ENCOUNTER — Encounter: Payer: Self-pay | Admitting: Nurse Practitioner

## 2024-02-04 ENCOUNTER — Ambulatory Visit: Payer: Medicare HMO | Admitting: Nurse Practitioner

## 2024-02-04 VITALS — BP 110/70 | HR 82 | Ht 64.0 in | Wt 152.2 lb

## 2024-02-04 DIAGNOSIS — E039 Hypothyroidism, unspecified: Secondary | ICD-10-CM | POA: Diagnosis not present

## 2024-02-04 MED ORDER — LEVOTHYROXINE SODIUM 50 MCG PO TABS: 50.0000 ug | ORAL_TABLET | Freq: Every day | ORAL | 3 refills | Status: DC

## 2024-02-04 NOTE — Progress Notes (Signed)
 Endocrinology Follow Up Visit                                        02/04/2024, 12:56 PM     SUBJECTIVE:  [name] is a 83 y.o.-year-old female patient being seen in follow up after being seen in consultation for hypothyroidism referred by Benita Stabile, MD.   Past Medical History:  Diagnosis Date   Anemia    Anxiety    Arthritis    Asthma    Atrial flutter (HCC)    Constipation    Dysrhythmia    GERD (gastroesophageal reflux disease)    High blood pressure    History of COVID-19 12/28/2020   Hypothyroidism    PAD (peripheral artery disease) (HCC)    bilateral legs   Peripheral edema    Peripheral neuropathy    bilateral feet at night occ   Psoriasis    Seasonal allergies    Skin cancer    Left eye brow   Varicose veins    Vertigo 11/23/2013    Past Surgical History:  Procedure Laterality Date   APPENDECTOMY     patient unsure   BIOPSY  12/21/2020   Procedure: BIOPSY;  Surgeon: Corbin Ade, MD;  Location: [facility];  Service: Endoscopy;;   BIOPSY  05/28/2021   Procedure: BIOPSY;  Surgeon: Lanelle Bal, DO;  Location: [facility];  Service: Endoscopy;;   CARDIOVERSION N/A 04/04/2023   Procedure: CARDIOVERSION;  Surgeon: Antoine Poche, MD;  Location: [facility];  Service: Endoscopy;  Laterality: N/A;   CATARACT EXTRACTION W/PHACO Left 01/27/2017   Procedure: CATARACT EXTRACTION PHACO AND INTRAOCULAR LENS PLACEMENT (IOC);  Surgeon: Gemma Payor, MD;  Location: [facility];  Service: Ophthalmology;  Laterality: Left;  CDE: 29.90   CATARACT EXTRACTION W/PHACO Right 02/24/2017   Procedure: CATARACT EXTRACTION PHACO AND INTRAOCULAR LENS PLACEMENT (IOC);  Surgeon: Gemma Payor, MD;  Location: [facility];  Service: Ophthalmology;  Laterality: Right;  CDE: 8.57   CHOLECYSTECTOMY     COLONOSCOPY N/A 10/01/2016   Procedure: COLONOSCOPY;  Surgeon: Corbin Ade, MD; pancolonic diverticulosis, melanosis  coli, otherwise normal exam.  No recommendations to repeat colonoscopy.   COLONOSCOPY WITH PROPOFOL N/A 12/21/2020   Rourk: Diverticulosis   ESOPHAGOGASTRODUODENOSCOPY (EGD) WITH PROPOFOL N/A 12/21/2020   Rourk: Erosive reflux esophagitis, nonbleeding gastric ulcers with biopsy showing erosion/ulceration, negative for H. pylori.   ESOPHAGOGASTRODUODENOSCOPY (EGD) WITH PROPOFOL N/A 05/28/2021   Carver: gastritis (reactive gastropathy), no H.pylori   FLEXOR TENDON REPAIR Right 11/06/2017   Procedure: RIGHT WRIST FLEXOR TENDON REPAIRAND STT DEBRIEDMENT;  Surgeon: Betha Loa, MD;  Location: [facility];  Service: Orthopedics;  Laterality: Right;   MOUTH SURGERY     artery bleed   TOTAL HIP ARTHROPLASTY Right 01/31/2021   Procedure: TOTAL HIP ARTHROPLASTY ANTERIOR APPROACH;  Surgeon: Samson Frederic, MD;  Location: [facility];  Service: Orthopedics;  Laterality: Right;   TUBAL LIGATION      Social History   Socioeconomic History   Marital status: Married  Spouse name: Not on file   Number of children: Not on file   Years of education: Not on file   Highest education level: Not on file  Occupational History   Not on file  Tobacco Use   Smoking status: Former    Current packs/day: 0.00    Average packs/day: 0.3 packs/day for 5.0 years (1.3 ttl pk-yrs)    Types: Cigarettes    Start date: 12/02/1992    Quit date: 12/02/1997    Years since quitting: 26.1    Passive exposure: Past   Smokeless tobacco: Never  Vaping Use   Vaping status: Never Used  Substance and Sexual Activity   Alcohol use: Not Currently    Alcohol/week: 1.0 standard drink of alcohol    Types: 1 Glasses of wine per week   Drug use: No   Sexual activity: Not Currently    Birth control/protection: Surgical    Comment: tubal  Other Topics Concern   Not on file  Social History Narrative   Not on file   Social Drivers of Health   Financial Resource Strain: Low Risk  (04/10/2020)   Overall Financial  Resource Strain (CARDIA)    Difficulty of Paying Living Expenses: Not hard at all  Food Insecurity: No Food Insecurity (05/31/2021)   Hunger Vital Sign    Worried About Running Out of Food in the Last Year: Never true    Ran Out of Food in the Last Year: Never true  Transportation Needs: No Transportation Needs (02/19/2022)   PRAPARE - Administrator, Civil Service (Medical): No    Lack of Transportation (Non-Medical): No  Physical Activity: Inactive (04/10/2020)   Exercise Vital Sign    Days of Exercise per Week: 0 days    Minutes of Exercise per Session: 0 min  Stress: Stress Concern Present (04/10/2020)   [employer] of Occupational Health - Occupational Stress Questionnaire    Feeling of Stress : To some extent  Social Connections: Unknown (05/16/2022)   [facility]   Social Network    Social Network: Not on file    Family History  Problem Relation Age of Onset   Hypertension Mother    Varicose Veins Father    Heart attack Father    Heart disease Father    Hypertension Brother    COPD Brother     Outpatient Encounter Medications as of 02/04/2024  Medication Sig   acetaminophen (TYLENOL) 500 MG tablet Take 500-1,000 mg by mouth as needed for mild pain or moderate pain.   albuterol (VENTOLIN HFA) 108 (90 Base) MCG/ACT inhaler Inhale 2 puffs into the lungs every 6 (six) hours as needed for wheezing or shortness of breath.   ALPRAZolam (XANAX) 0.5 MG tablet Take 0.5 mg by mouth in the morning, at noon, and at bedtime. (1000, 1500, AT BEDTIME)   apixaban (ELIQUIS) 5 MG TABS tablet Take 1 tablet (5 mg total) by mouth 2 (two) times daily.   azelastine (ASTELIN) 0.1 % nasal spray Place 1 spray into both nostrils 2 (two) times daily.   Budeson-Glycopyrrol-Formoterol (BREZTRI AEROSPHERE) 160-9-4.8 MCG/ACT AERO Inhale 2 puffs into the lungs daily.   clotrimazole-betamethasone (LOTRISONE) cream Apply 1 Application topically daily.    diclofenac Sodium (VOLTAREN) 1 % GEL Apply 1 application topically as needed (back pain).   famotidine (PEPCID) 20 MG tablet TAKE 1 TABLET BY MOUTH DAILY AFTER SUPPER   fluticasone (FLONASE) 50 MCG/ACT nasal spray Place 2 sprays into both nostrils  in the morning.   furosemide (LASIX) 40 MG tablet TAKE ONE TABLET BY MOUTH EVERY DAY. MAY TAKE ADDITIONAL TABLET AS NEEDED IF SHORT OF BREATH CONTINUES   ipratropium (ATROVENT) 0.06 % nasal spray Place 2 sprays into both nostrils 4 (four) times daily as needed for rhinitis.   lidocaine (XYLOCAINE) 2 % solution Use as directed 5 mLs in the mouth or throat as needed for mouth pain. Gargle and spit 5mL up to three times daily as needed for throat pain.   losartan (COZAAR) 50 MG tablet Take 50 mg by mouth 2 (two) times daily.   Multiple Vitamin (MULTIVITAMIN WITH MINERALS) TABS tablet Take 1 tablet by mouth daily in the afternoon.   NONFORMULARY OR COMPOUNDED ITEM Apply 1 Application topically 3 (three) times daily as needed (nerve pain.). Baclofen 2%/Diclofenac Sodium 3%/Gabapentin 5%/Lidocaine HCl 5%/Menthol 1% Topical Gel   pantoprazole (PROTONIX) 40 MG tablet TAKE 1 TABLET BY MOUTH ONCE A DAY.   Polyethyl Glycol-Propyl Glycol (LUBRICANT EYE DROPS) 0.4-0.3 % SOLN Place 1-2 drops into both eyes 3 (three) times daily as needed (dry/irritated eyes.).   polyethylene glycol (MIRALAX / GLYCOLAX) 17 g packet Take 17 g by mouth every evening.   potassium chloride (KLOR-CON) 10 MEQ tablet Take 2 tablets (20 mEq total) by mouth daily.   raloxifene (EVISTA) 60 MG tablet Take 1 tablet (60 mg total) by mouth every morning. (Patient taking differently: Take 60 mg by mouth every other day. IN THE MORNING)   SODIUM FLUORIDE 5000 PPM 1.1 % PSTE Take by mouth at bedtime.   venlafaxine XR (EFFEXOR-XR) 75 MG 24 hr capsule Take 75 mg by mouth daily after breakfast.   vitamin D3 (CHOLECALCIFEROL) 25 MCG tablet Take 1,000 Units by mouth daily.   [DISCONTINUED] levothyroxine  (SYNTHROID) 50 MCG tablet Take 1 tablet (50 mcg total) by mouth daily before breakfast.   fluocinonide gel (LIDEX) 0.05 % Apply 1 Application topically 2 (two) times daily. (Patient not taking: Reported on 02/04/2024)   levothyroxine (SYNTHROID) 50 MCG tablet Take 1 tablet (50 mcg total) by mouth daily before breakfast.   mupirocin ointment (BACTROBAN) 2 % APPLY TO AFFECTED AREA DAILY (Patient not taking: Reported on 02/04/2024)   mupirocin ointment (BACTROBAN) 2 % Apply 1 Application topically 2 (two) times daily. (Patient not taking: Reported on 02/04/2024)   [DISCONTINUED] amoxicillin (AMOXIL) 500 MG capsule  (Patient not taking: Reported on 02/04/2024)   [DISCONTINUED] docusate sodium (COLACE) 100 MG capsule Take 100 mg by mouth in the morning. (Patient not taking: Reported on 02/04/2024)   [DISCONTINUED] ZINC CITRATE PO Take 50 mg by mouth every evening. (Patient not taking: Reported on 02/04/2024)   No facility-administered encounter medications on file as of 02/04/2024.    ALLERGIES: Allergies  Allergen Reactions   Amoxicillin-Pot Clavulanate Nausea And Vomiting, Other (See Comments), Itching and Nausea Only    Has patient had a PCN reaction causing immediate rash, facial/tongue/throat swelling, SOB or lightheadedness with hypotension: No Has patient had a PCN reaction causing severe rash involving mucus membranes or skin necrosis: No Has patient had a PCN reaction that required hospitalization: No Has patient had a PCN reaction occurring within the last 10 years: No If all of the above answers are "NO", then may proceed with Cephalosporin use. Has patient had a PCN reaction causing immediate rash, facial/tongue/throat swelling, SOB or lightheadedness with hypotension: No Has patient had a PCN reaction causing severe rash involving mucus membranes or skin necrosis: No Has patient had a PCN reaction that  required hospitalization: No Has patient had a PCN reaction occurring within the last 10 years:  No If all of the above answers are "NO", then may proceed with Cephalosporin use.    Ace Inhibitors Cough   Guaifenesin Hives and Other (See Comments)   Lisinopril Cough and Other (See Comments)   Cetirizine Other (See Comments)    Unknown reaction   Clarithromycin     Made her feel crazy.    Entex La Itching   Gabapentin Other (See Comments)    Foggy head   Levofloxacin Hives and Itching   Meloxicam Other (See Comments)    (MOBIC) Unknown reaction type (maybe nausea?)   VACCINATION STATUS: Immunization History  Administered Date(s) Administered   Influenza Whole 09/01/2016   Influenza, High Dose Seasonal PF 09/11/2018, 11/02/2019, 09/20/2021, 08/07/2022   Influenza-Unspecified 08/02/2013, 08/29/2015   PFIZER(Purple Top)SARS-COV-2 Vaccination 12/16/2019, 01/17/2020, 09/28/2020   Pneumococcal Conjugate-13 08/31/2019   Tdap 10/13/2022   Zoster Recombinant(Shingrix) 08/31/2019     HPI  Thyroid Problem Presents for follow-up visit. Symptoms include anxiety. Patient reports no cold intolerance, constipation, depressed mood, fatigue, heat intolerance, palpitations, tremors, weight gain or weight loss. The symptoms have been stable.    [name]  is a patient with the above medical history.  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    I reviewed patient's thyroid tests:  Lab Results  Component Value Date   TSH 2.510 01/22/2024   TSH 3.780 12/23/2023   TSH 3.359 10/22/2023   TSH 5.800 (H) 09/10/2023   TSH 2.300 03/18/2023   TSH 3.140 09/16/2022   TSH 1.740 03/18/2022   TSH 2.670 09/19/2021   TSH 2.040 05/14/2021   TSH 6.350 (H) 02/08/2021   FREET4 1.12 01/22/2024   FREET4 1.19 12/23/2023   FREET4 0.89 10/22/2023   FREET4 1.29 09/10/2023   FREET4 1.09 03/18/2023   FREET4 1.23 09/16/2022   FREET4 1.38 03/18/2022   FREET4 1.09 09/19/2021   FREET4 1.36 05/14/2021   FREET4 1.21 02/08/2021     Pt denies feeling nodules in neck,  hoarseness, dysphagia/odynophagia, SOB with lying down.  she has No history of radiation therapy to head or neck.  No recent use of iodine supplements.  I reviewed her chart and she also has a history of GERD with ulcers, depression/anxiety, anemia, asthma, and high blood pressure.   Review of systems  Constitutional: + stable body weight,  current Body mass index is 26.13 kg/m. , + fatigue-stable, no subjective hyperthermia, no subjective hypothermia Eyes: no blurry vision, no xerophthalmia ENT: no sore throat, no nodules palpated in throat, no dysphagia/odynophagia, no hoarseness Cardiovascular: no chest pain, no shortness of breath, no palpitations, no leg swelling Respiratory: no cough, no shortness of breath Gastrointestinal: no nausea/vomiting/diarrhea Musculoskeletal: no muscle/joint aches Skin: no rashes, no hyperemia Neurological: no tremors, no numbness, no tingling, no dizziness Psychiatric: no depression, no anxiety, has significant amount of stress caring for her husband with Alzheimers  ------------------------------------------------------------------------------------------------------------------ OBJECTIVE:  BP 110/70 (BP Location: Right Arm, Patient Position: Sitting, Cuff Size: Large)   Pulse 82   Ht 5\' 4"  (1.626 m)   Wt 152 lb 3.2 oz (69 kg)   BMI 26.13 kg/m  Wt Readings from Last 3 Encounters:  02/04/24 152 lb 3.2 oz (69 kg)  01/20/24 152 lb 12.8 oz (69.3 kg)  01/09/24 151 lb 3.2 oz (68.6 kg)   BP Readings from Last 3 Encounters:  02/04/24 110/70  01/20/24 116/75  10/24/23 Marland Kitchen)  142/96     Physical Exam- Limited  Constitutional:  Body mass index is 26.13 kg/m. , not in acute distress, normal state of mind Eyes:  EOMI, no exophthalmos Musculoskeletal: no gross deformities, strength intact in all four extremities, no gross restriction of joint movements Skin:  no rashes, no hyperemia Neurological: no tremor with outstretched hands   CMP ( most  recent) CMP     Component Value Date/Time   NA 135 11/11/2023 1142   NA 141 09/24/2018 1204   K 3.5 11/11/2023 1142   CL 101 11/11/2023 1142   CO2 25 11/11/2023 1142   GLUCOSE 93 11/11/2023 1142   BUN 18 11/11/2023 1142   BUN 13 09/24/2018 1204   CREATININE 1.06 (H) 11/11/2023 1142   CREATININE 0.94 (H) 01/22/2021 1419   CALCIUM 8.8 (L) 11/11/2023 1142   PROT 7.2 02/20/2023 1337   PROT 6.5 09/24/2018 1204   ALBUMIN 4.1 02/20/2023 1337   ALBUMIN 4.3 09/24/2018 1204   AST 24 02/20/2023 1337   ALT 18 02/20/2023 1337   ALKPHOS 74 02/20/2023 1337   BILITOT 0.8 02/20/2023 1337   BILITOT 0.4 09/24/2018 1204   GFRNONAA 53 (L) 11/11/2023 1142   GFRAA >60 05/08/2020 1232     Diabetic Labs (most recent): No results found for: "HGBA1C", "MICROALBUR"   Lipid Panel ( most recent) Lipid Panel     Component Value Date/Time   TRIG 92 09/11/2020 1547       Lab Results  Component Value Date   TSH 2.510 01/22/2024   TSH 3.780 12/23/2023   TSH 3.359 10/22/2023   TSH 5.800 (H) 09/10/2023   TSH 2.300 03/18/2023   TSH 3.140 09/16/2022   TSH 1.740 03/18/2022   TSH 2.670 09/19/2021   TSH 2.040 05/14/2021   TSH 6.350 (H) 02/08/2021   FREET4 1.12 01/22/2024   FREET4 1.19 12/23/2023   FREET4 0.89 10/22/2023   FREET4 1.29 09/10/2023   FREET4 1.09 03/18/2023   FREET4 1.23 09/16/2022   FREET4 1.38 03/18/2022   FREET4 1.09 09/19/2021   FREET4 1.36 05/14/2021   FREET4 1.21 02/08/2021      Latest Reference Range & Units 09/16/22 15:20 03/18/23 11:01 09/10/23 12:11 10/22/23 12:16 12/23/23 12:15 01/22/24 16:27  TSH 0.450 - 4.500 uIU/mL 3.140 2.300 5.800 (H) 3.359 3.780 2.510  T4,Free(Direct) 0.82 - 1.77 ng/dL 1.61 0.96 0.45 4.09 8.11 1.12  (H): Data is abnormally high  --------------------------------------------------------------------------------------------------------------------   ASSESSMENT / PLAN: 1. Hypothyroidism- acquired   Patient with long-standing hypothyroidism,  on levothyroxine therapy. On physical exam, patient does not have gross goiter, thyroid nodules, or neck compression symptoms.  Thyroid US WNL for age.  -Her previsit thyroid function tests are consistent with appropriate hormone replacement.  She is advised to continue Levothyroxine 50 mcg po daily before breakfast.   - We discussed about correct intake of levothyroxine, at fasting, with water, separated by at least 30 minutes from breakfast. -Patient is made aware of the fact that thyroid hormone replacement is needed for life, dose to be adjusted by periodic monitoring of thyroid function tests.  -She notes she feels like she is catching a cold or something as she just "feels horrible" but she could not give specific symptoms.  She says she may call her PCP for evaluation soon.    I spent  20  minutes in the care of the patient today including review of labs from Thyroid Function, CMP, and other relevant labs ; imaging/biopsy records (current and previous including abstractions from  other facilities); face-to-face time discussing  her lab results and symptoms, medications doses, her options of short and long term treatment based on the latest standards of care / guidelines;   and documenting the encounter.  Marita Kansas  participated in the discussions, expressed understanding, and voiced agreement with the above plans.  All questions were answered to her satisfaction. she is encouraged to contact clinic should she have any questions or concerns prior to her return visit.   Follow Up Plan: Return in about 4 months (around 06/05/2024) for Thyroid follow up, Previsit labs.   Ronny Bacon, [facility] [address] [place], [place] [address] Phone: [phone] Fax: [phone]  02/04/2024, 12:56 PM

## 2024-02-04 NOTE — Telephone Encounter (Signed)
 Pt called to check on the status of the IFOBT that she dropped off.

## 2024-02-04 NOTE — Patient Instructions (Signed)

## 2024-02-06 NOTE — Telephone Encounter (Signed)
 Pt was made aware and made an appt to discuss.

## 2024-02-06 NOTE — Telephone Encounter (Signed)
 Please let her know her ifobt was positive for blood in the stool.  I suspect her anemia is likely anemia of chronic disease.  Her last colonoscopy/EGD were in 2022.   We can offer her colonoscopy with possible EGD. We can offer her return ov to discuss or if she wants to proceed with can work on cardiac clearance for hold eliquis and then schedule.

## 2024-02-07 DIAGNOSIS — K5900 Constipation, unspecified: Secondary | ICD-10-CM | POA: Diagnosis not present

## 2024-02-07 DIAGNOSIS — Z7901 Long term (current) use of anticoagulants: Secondary | ICD-10-CM | POA: Diagnosis not present

## 2024-02-07 DIAGNOSIS — L97512 Non-pressure chronic ulcer of other part of right foot with fat layer exposed: Secondary | ICD-10-CM | POA: Diagnosis not present

## 2024-02-07 DIAGNOSIS — E11621 Type 2 diabetes mellitus with foot ulcer: Secondary | ICD-10-CM | POA: Diagnosis not present

## 2024-02-07 DIAGNOSIS — I890 Lymphedema, not elsewhere classified: Secondary | ICD-10-CM | POA: Diagnosis not present

## 2024-02-07 DIAGNOSIS — I509 Heart failure, unspecified: Secondary | ICD-10-CM | POA: Diagnosis not present

## 2024-02-07 DIAGNOSIS — D649 Anemia, unspecified: Secondary | ICD-10-CM | POA: Diagnosis not present

## 2024-02-07 DIAGNOSIS — I872 Venous insufficiency (chronic) (peripheral): Secondary | ICD-10-CM | POA: Diagnosis not present

## 2024-02-07 DIAGNOSIS — F419 Anxiety disorder, unspecified: Secondary | ICD-10-CM | POA: Diagnosis not present

## 2024-02-09 DIAGNOSIS — Z7901 Long term (current) use of anticoagulants: Secondary | ICD-10-CM | POA: Diagnosis not present

## 2024-02-09 DIAGNOSIS — D649 Anemia, unspecified: Secondary | ICD-10-CM | POA: Diagnosis not present

## 2024-02-09 DIAGNOSIS — I890 Lymphedema, not elsewhere classified: Secondary | ICD-10-CM | POA: Diagnosis not present

## 2024-02-09 DIAGNOSIS — E11621 Type 2 diabetes mellitus with foot ulcer: Secondary | ICD-10-CM | POA: Diagnosis not present

## 2024-02-09 DIAGNOSIS — I872 Venous insufficiency (chronic) (peripheral): Secondary | ICD-10-CM | POA: Diagnosis not present

## 2024-02-09 DIAGNOSIS — L97512 Non-pressure chronic ulcer of other part of right foot with fat layer exposed: Secondary | ICD-10-CM | POA: Diagnosis not present

## 2024-02-09 DIAGNOSIS — I509 Heart failure, unspecified: Secondary | ICD-10-CM | POA: Diagnosis not present

## 2024-02-09 DIAGNOSIS — K5900 Constipation, unspecified: Secondary | ICD-10-CM | POA: Diagnosis not present

## 2024-02-09 DIAGNOSIS — F419 Anxiety disorder, unspecified: Secondary | ICD-10-CM | POA: Diagnosis not present

## 2024-02-10 ENCOUNTER — Other Ambulatory Visit: Payer: Self-pay | Admitting: Allergy & Immunology

## 2024-02-11 ENCOUNTER — Other Ambulatory Visit: Payer: Self-pay | Admitting: Internal Medicine

## 2024-02-12 DIAGNOSIS — J0100 Acute maxillary sinusitis, unspecified: Secondary | ICD-10-CM | POA: Diagnosis not present

## 2024-02-13 DIAGNOSIS — I890 Lymphedema, not elsewhere classified: Secondary | ICD-10-CM | POA: Diagnosis not present

## 2024-02-13 DIAGNOSIS — I509 Heart failure, unspecified: Secondary | ICD-10-CM | POA: Diagnosis not present

## 2024-02-13 DIAGNOSIS — L97512 Non-pressure chronic ulcer of other part of right foot with fat layer exposed: Secondary | ICD-10-CM | POA: Diagnosis not present

## 2024-02-13 DIAGNOSIS — K5900 Constipation, unspecified: Secondary | ICD-10-CM | POA: Diagnosis not present

## 2024-02-13 DIAGNOSIS — Z7901 Long term (current) use of anticoagulants: Secondary | ICD-10-CM | POA: Diagnosis not present

## 2024-02-13 DIAGNOSIS — I872 Venous insufficiency (chronic) (peripheral): Secondary | ICD-10-CM | POA: Diagnosis not present

## 2024-02-13 DIAGNOSIS — E11621 Type 2 diabetes mellitus with foot ulcer: Secondary | ICD-10-CM | POA: Diagnosis not present

## 2024-02-13 DIAGNOSIS — F419 Anxiety disorder, unspecified: Secondary | ICD-10-CM | POA: Diagnosis not present

## 2024-02-13 DIAGNOSIS — D649 Anemia, unspecified: Secondary | ICD-10-CM | POA: Diagnosis not present

## 2024-02-16 ENCOUNTER — Ambulatory Visit: Payer: Medicare HMO | Admitting: Podiatry

## 2024-02-16 ENCOUNTER — Encounter: Payer: Self-pay | Admitting: Podiatry

## 2024-02-16 DIAGNOSIS — L97511 Non-pressure chronic ulcer of other part of right foot limited to breakdown of skin: Secondary | ICD-10-CM | POA: Diagnosis not present

## 2024-02-16 DIAGNOSIS — L84 Corns and callosities: Secondary | ICD-10-CM

## 2024-02-16 NOTE — Progress Notes (Unsigned)
 GI Office Note    Referring Provider: Benita Stabile, MD Primary Care Physician:  Benita Stabile, MD  Primary Gastroenterologist: Roetta Sessions, MD   Chief Complaint   No chief complaint on file.   History of Present Illness   [name] is a 83 y.o. female presenting today for follow up anemia, hemoccult positive stool. Last seen 01/2024. She has GERD, gastric ulcers, and constipation.   Recent labs with Hgb 10.5, iron 89, iron sat 28, TIBC 314, ferritin 123, B12 823, folate >20. Stool ifobt positive.   EGD 05/2021: -gastritis (reactive gastropathy, no H.pylori)   EGD 12/2020:  -erosive reflux esophagitis -nonbleeding gastric ulcers, biopsy showed erosion/ulceration, negative for H. Pylori   Colonoscopy January 2022: -Diverticulosis   CT abdomen pelvis February 2022 with subacute impaction of the right subcapital femoral neck, diverticulosis without diverticulitis, absent gallbladder.  Patient was referred to orthopedic.  She had a hip replacement and was told by Ortho that her pain had nothing to do with her hip replacement.   CT abdomen pelvis March 15, 2021 after being referred to the ED from our office for abdominal pain, stable intra and extrahepatic biliary duct dilation likely related to prior cholecystectomy, postsurgical changes seen from interval right hip arthroplasty.  Postoperative seroma anterior to the right hip prosthesis measuring 5.1 x 2.8 x 6.6 cm.   Medications   Current Outpatient Medications  Medication Sig Dispense Refill   acetaminophen (TYLENOL) 500 MG tablet Take 500-1,000 mg by mouth as needed for mild pain or moderate pain.     albuterol (VENTOLIN HFA) 108 (90 Base) MCG/ACT inhaler Inhale 2 puffs into the lungs every 6 (six) hours as needed for wheezing or shortness of breath. 8 g 1   ALPRAZolam (XANAX) 0.5 MG tablet Take 0.5 mg by mouth in the morning, at noon, and at bedtime. (1000, 1500, AT BEDTIME)     apixaban (ELIQUIS) 5 MG TABS  tablet Take 1 tablet (5 mg total) by mouth 2 (two) times daily. 60 tablet 11   azelastine (ASTELIN) 0.1 % nasal spray Place 1 spray into both nostrils 2 (two) times daily. 30 mL 5   Budeson-Glycopyrrol-Formoterol (BREZTRI AEROSPHERE) 160-9-4.8 MCG/ACT AERO Inhale 2 puffs into the lungs daily. 10.7 g 11   clotrimazole-betamethasone (LOTRISONE) cream Apply 1 Application topically daily. 30 g 0   diclofenac Sodium (VOLTAREN) 1 % GEL Apply 1 application topically as needed (back pain).     famotidine (PEPCID) 20 MG tablet TAKE 1 TABLET BY MOUTH DAILY AFTER SUPPER 30 tablet 5   fluocinonide gel (LIDEX) 0.05 % Apply 1 Application topically 2 (two) times daily.     fluticasone (FLONASE) 50 MCG/ACT nasal spray USE 2 SPRAYS INTO BOTH NOSTRILS EVERY MORNING 16 g 4   furosemide (LASIX) 40 MG tablet TAKE ONE TABLET BY MOUTH EVERY DAY. MAY TAKE ADDITIONAL TABLET AS NEEDED IF SHORT OF BREATH CONTINUES 90 tablet 1   ipratropium (ATROVENT) 0.06 % nasal spray Place 2 sprays into both nostrils 4 (four) times daily as needed for rhinitis. 15 mL 5   levothyroxine (SYNTHROID) 50 MCG tablet Take 1 tablet (50 mcg total) by mouth daily before breakfast. 90 tablet 3   lidocaine (XYLOCAINE) 2 % solution Use as directed 5 mLs in the mouth or throat as needed for mouth pain. Gargle and spit 5mL up to three times daily as needed for throat pain. 100 mL 0   losartan (COZAAR) 50 MG tablet Take 50 mg by  mouth 2 (two) times daily.     Multiple Vitamin (MULTIVITAMIN WITH MINERALS) TABS tablet Take 1 tablet by mouth daily in the afternoon.     mupirocin ointment (BACTROBAN) 2 % APPLY TO AFFECTED AREA DAILY 22 g 2   mupirocin ointment (BACTROBAN) 2 % Apply 1 Application topically 2 (two) times daily. 30 g 2   NONFORMULARY OR COMPOUNDED ITEM Apply 1 Application topically 3 (three) times daily as needed (nerve pain.). Baclofen 2%/Diclofenac Sodium 3%/Gabapentin 5%/Lidocaine HCl 5%/Menthol 1% Topical Gel     pantoprazole (PROTONIX) 40 MG  tablet TAKE 1 TABLET BY MOUTH ONCE A DAY. 30 tablet 11   Polyethyl Glycol-Propyl Glycol (LUBRICANT EYE DROPS) 0.4-0.3 % SOLN Place 1-2 drops into both eyes 3 (three) times daily as needed (dry/irritated eyes.).     polyethylene glycol (MIRALAX / GLYCOLAX) 17 g packet Take 17 g by mouth every evening.     potassium chloride (KLOR-CON) 10 MEQ tablet Take 2 tablets (20 mEq total) by mouth daily. 180 tablet 3   raloxifene (EVISTA) 60 MG tablet Take 1 tablet (60 mg total) by mouth every morning. (Patient taking differently: Take 60 mg by mouth every other day. IN THE MORNING) 30 tablet 12   SODIUM FLUORIDE 5000 PPM 1.1 % PSTE Take by mouth at bedtime.     venlafaxine XR (EFFEXOR-XR) 75 MG 24 hr capsule Take 75 mg by mouth daily after breakfast.     vitamin D3 (CHOLECALCIFEROL) 25 MCG tablet Take 1,000 Units by mouth daily.     No current facility-administered medications for this visit.    Allergies   Allergies as of 02/17/2024 - Review Complete 02/16/2024  Allergen Reaction Noted   Amoxicillin-pot clavulanate Nausea And Vomiting, Other (See Comments), Itching, and Nausea Only 10/04/2013   Ace inhibitors Cough    Guaifenesin Hives and Other (See Comments) 08/01/2022   Lisinopril Cough and Other (See Comments) 08/01/2022   Cetirizine Other (See Comments) 03/26/2022   Clarithromycin  01/14/2023   Entex la Itching    Gabapentin Other (See Comments) 03/26/2022   Levofloxacin Hives and Itching 01/21/2017   Meloxicam Other (See Comments) 04/25/2020     Past Medical History   Past Medical History:  Diagnosis Date   Anemia    Anxiety    Arthritis    Asthma    Atrial flutter (HCC)    Constipation    Dysrhythmia    GERD (gastroesophageal reflux disease)    High blood pressure    History of COVID-19 12/28/2020   Hypothyroidism    PAD (peripheral artery disease) (HCC)    bilateral legs   Peripheral edema    Peripheral neuropathy    bilateral feet at night occ   Psoriasis    Seasonal  allergies    Skin cancer    Left eye brow   Varicose veins    Vertigo 11/23/2013    Past Surgical History   Past Surgical History:  Procedure Laterality Date   APPENDECTOMY     patient unsure   BIOPSY  12/21/2020   Procedure: BIOPSY;  Surgeon: Corbin Ade, MD;  [facility];  Service: Endoscopy;;   BIOPSY  05/28/2021   Procedure: BIOPSY;  Surgeon: Lanelle Bal, DO;  [facility];  Service: Endoscopy;;   CARDIOVERSION N/A 04/04/2023   Procedure: CARDIOVERSION;  Surgeon: Antoine Poche, MD;  [facility]: [facility];  Service: Endoscopy;  Laterality: N/A;   CATARACT EXTRACTION W/PHACO Left 01/27/2017   Procedure: CATARACT EXTRACTION PHACO  AND INTRAOCULAR LENS PLACEMENT (IOC);  Surgeon: Gemma Payor, MD;  Location: [facility];  Service: Ophthalmology;  Laterality: Left;  CDE: 29.90   CATARACT EXTRACTION W/PHACO Right 02/24/2017   Procedure: CATARACT EXTRACTION PHACO AND INTRAOCULAR LENS PLACEMENT (IOC);  Surgeon: Gemma Payor, MD;  Location: [facility];  Service: Ophthalmology;  Laterality: Right;  CDE: 8.57   CHOLECYSTECTOMY     COLONOSCOPY N/A 10/01/2016   Procedure: COLONOSCOPY;  Surgeon: Corbin Ade, MD; pancolonic diverticulosis, melanosis coli, otherwise normal exam.  No recommendations to repeat colonoscopy.   COLONOSCOPY WITH PROPOFOL N/A 12/21/2020   Rourk: Diverticulosis   ESOPHAGOGASTRODUODENOSCOPY (EGD) WITH PROPOFOL N/A 12/21/2020   Rourk: Erosive reflux esophagitis, nonbleeding gastric ulcers with biopsy showing erosion/ulceration, negative for H. pylori.   ESOPHAGOGASTRODUODENOSCOPY (EGD) WITH PROPOFOL N/A 05/28/2021   Carver: gastritis (reactive gastropathy), no H.pylori   FLEXOR TENDON REPAIR Right 11/06/2017   Procedure: RIGHT WRIST FLEXOR TENDON REPAIRAND STT DEBRIEDMENT;  Surgeon: Betha Loa, MD;  Location: [facility];  Service: Orthopedics;  Laterality: Right;   MOUTH SURGERY     artery bleed   TOTAL HIP ARTHROPLASTY Right  01/31/2021   Procedure: TOTAL HIP ARTHROPLASTY ANTERIOR APPROACH;  Surgeon: Samson Frederic, MD;  [facility]: [facility];  Service: Orthopedics;  Laterality: Right;   TUBAL LIGATION      Past Family History   Family History  Problem Relation Age of Onset   Hypertension Mother    Varicose Veins Father    Heart attack Father    Heart disease Father    Hypertension Brother    COPD Brother     Past Social History   Social History   Socioeconomic History   Marital status: Married    Spouse name: Not on file   Number of children: Not on file   Years of education: Not on file   Highest education level: Not on file  Occupational History   Not on file  Tobacco Use   Smoking status: Former    Current packs/day: 0.00    Average packs/day: 0.3 packs/day for 5.0 years (1.3 ttl pk-yrs)    Types: Cigarettes    Start date: 12/02/1992    Quit date: 12/02/1997    Years since quitting: 26.2    Passive exposure: Past   Smokeless tobacco: Never  Vaping Use   Vaping status: Never Used  Substance and Sexual Activity   Alcohol use: Not Currently    Alcohol/week: 1.0 standard drink of alcohol    Types: 1 Glasses of wine per week   Drug use: No   Sexual activity: Not Currently    Birth control/protection: Surgical    Comment: tubal  Other Topics Concern   Not on file  Social History Narrative   Not on file   Social Drivers of Health   Financial Resource Strain: Low Risk  (04/10/2020)   Overall Financial Resource Strain (CARDIA)    Difficulty of Paying Living Expenses: Not hard at all  Food Insecurity: No Food Insecurity (05/31/2021)   Hunger Vital Sign    Worried About Running Out of Food in the Last Year: Never true    Ran Out of Food in the Last Year: Never true  Transportation Needs: No Transportation Needs (02/19/2022)   PRAPARE - Administrator, Civil Service (Medical): No    Lack of Transportation (Non-Medical): No  Physical Activity: Inactive (04/10/2020)   Exercise  Vital Sign    Days of Exercise per Week: 0 days  Minutes of Exercise per Session: 0 min  Stress: Stress Concern Present (04/10/2020)   [employer] of Occupational Health - Occupational Stress Questionnaire    Feeling of Stress : To some extent  Social Connections: Unknown (05/16/2022)   Received from [facility], [facility]   Social Network    Social Network: Not on file  Intimate Partner Violence: Unknown (05/16/2022)   Received from [facility], [facility]   HITS    Physically Hurt: Not on file    Insult or Talk Down To: Not on file    Threaten Physical Harm: Not on file    Scream or Curse: Not on file    Review of Systems   General: Negative for anorexia, weight loss, fever, chills, fatigue, weakness. ENT: Negative for hoarseness, difficulty swallowing , nasal congestion. CV: Negative for chest pain, angina, palpitations, dyspnea on exertion, peripheral edema.  Respiratory: Negative for dyspnea at rest, dyspnea on exertion, cough, sputum, wheezing.  GI: See history of present illness. GU:  Negative for dysuria, hematuria, urinary incontinence, urinary frequency, nocturnal urination.  Endo: Negative for unusual weight change.     Physical Exam   There were no vitals taken for this visit.   General: Well-nourished, well-developed in no acute distress.  Eyes: No icterus. Mouth: Oropharyngeal mucosa moist and pink , no lesions erythema or exudate. Lungs: Clear to auscultation bilaterally.  Heart: Regular rate and rhythm, no murmurs rubs or gallops.  Abdomen: Bowel sounds are normal, nontender, nondistended, no hepatosplenomegaly or masses,  no abdominal bruits or hernia , no rebound or guarding.  Rectal: ***  Extremities: No lower extremity edema. No clubbing or deformities. Neuro: Alert and oriented x 4   Skin: Warm and dry, no jaundice.   Psych: Alert and cooperative, normal mood and affect.  Labs   *** Imaging Studies   No results  found.  Assessment/Plan   Chronic normocytic anemia: -stool ifobt positive  -chronic Eliquis use  Leanna Battles. Melvyn Neth, MHS, PA-C [facility]

## 2024-02-17 ENCOUNTER — Encounter: Payer: Self-pay | Admitting: Internal Medicine

## 2024-02-17 ENCOUNTER — Ambulatory Visit: Payer: Medicare HMO | Attending: Internal Medicine | Admitting: Internal Medicine

## 2024-02-17 ENCOUNTER — Encounter: Payer: Self-pay | Admitting: Gastroenterology

## 2024-02-17 ENCOUNTER — Telehealth: Payer: Self-pay | Admitting: *Deleted

## 2024-02-17 ENCOUNTER — Ambulatory Visit (INDEPENDENT_AMBULATORY_CARE_PROVIDER_SITE_OTHER): Admitting: Gastroenterology

## 2024-02-17 VITALS — BP 129/82 | HR 81 | Temp 97.5°F | Ht 64.0 in | Wt 154.4 lb

## 2024-02-17 VITALS — BP 138/92 | HR 89 | Ht 64.0 in | Wt 153.2 lb

## 2024-02-17 DIAGNOSIS — K219 Gastro-esophageal reflux disease without esophagitis: Secondary | ICD-10-CM | POA: Diagnosis not present

## 2024-02-17 DIAGNOSIS — Z79899 Other long term (current) drug therapy: Secondary | ICD-10-CM

## 2024-02-17 DIAGNOSIS — I890 Lymphedema, not elsewhere classified: Secondary | ICD-10-CM | POA: Diagnosis not present

## 2024-02-17 DIAGNOSIS — R195 Other fecal abnormalities: Secondary | ICD-10-CM | POA: Diagnosis not present

## 2024-02-17 DIAGNOSIS — I872 Venous insufficiency (chronic) (peripheral): Secondary | ICD-10-CM | POA: Diagnosis not present

## 2024-02-17 DIAGNOSIS — D649 Anemia, unspecified: Secondary | ICD-10-CM | POA: Diagnosis not present

## 2024-02-17 DIAGNOSIS — I4819 Other persistent atrial fibrillation: Secondary | ICD-10-CM

## 2024-02-17 DIAGNOSIS — I509 Heart failure, unspecified: Secondary | ICD-10-CM | POA: Diagnosis not present

## 2024-02-17 DIAGNOSIS — Z8711 Personal history of peptic ulcer disease: Secondary | ICD-10-CM | POA: Diagnosis not present

## 2024-02-17 DIAGNOSIS — E11621 Type 2 diabetes mellitus with foot ulcer: Secondary | ICD-10-CM | POA: Diagnosis not present

## 2024-02-17 DIAGNOSIS — K5900 Constipation, unspecified: Secondary | ICD-10-CM

## 2024-02-17 DIAGNOSIS — L97512 Non-pressure chronic ulcer of other part of right foot with fat layer exposed: Secondary | ICD-10-CM | POA: Diagnosis not present

## 2024-02-17 DIAGNOSIS — Z7901 Long term (current) use of anticoagulants: Secondary | ICD-10-CM | POA: Diagnosis not present

## 2024-02-17 DIAGNOSIS — F419 Anxiety disorder, unspecified: Secondary | ICD-10-CM | POA: Diagnosis not present

## 2024-02-17 MED ORDER — DAPAGLIFLOZIN PROPANEDIOL 10 MG PO TABS: 10.0000 mg | ORAL_TABLET | Freq: Every day | ORAL | 5 refills | Status: DC

## 2024-02-17 NOTE — Telephone Encounter (Signed)
 Patient with diagnosis of atrial fibrillation on Eliquis for anticoagulation.    What type of surgery is being performed? Colonoscopy/possible Esophagogastroduodenoscopy (EGD)   When is surgery scheduled? TBD   CHA2DS2-VASc Score = 6   This indicates a 9.7% annual risk of stroke. The patient's score is based upon: CHF History: 1 HTN History: 1 Diabetes History: 1 Stroke History: 0 Vascular Disease History: 0 Age Score: 2 Gender Score: 1   CrCl 46 Platelet count 233   Per office protocol, patient can hold Eiiquis for 2 days prior to procedure.   Patient will not need bridging with Lovenox (enoxaparin) around procedure.  **This guidance is not considered finalized until pre-operative APP has relayed final recommendations.**

## 2024-02-17 NOTE — Patient Instructions (Signed)
 I would recommend continuing pantoprazole daily before breakfast for now until your work up of anemia is complete.  Ok to stop iron.  Use miralax once daily as needed. If you don't have a good bowel movement during the day, then take a dose at night.  Colonoscopy with possible upper endoscopy to be scheduled once we get approval to hold Eliquis for 48 hours.

## 2024-02-17 NOTE — Telephone Encounter (Signed)
     Primary Cardiologist: Marjo Bicker, MD  Clinical pharmacist have reviewed chart  as part of pre-operative protocol coverage.  The following recommendations were provided for , [name]:  CHA2DS2-VASc Score = 6   This indicates a 9.7% annual risk of stroke. The patient's score is based upon: CHF History: 1 HTN History: 1 Diabetes History: 1 Stroke History: 0 Vascular Disease History: 0 Age Score: 2 Gender Score: 1   CrCl 46 Platelet count 233     Per office protocol, patient can hold Eiiquis for 2 days prior to procedure.   Patient will not need bridging with Lovenox (enoxaparin) around procedure.  I will route this recommendation to the requesting party via Epic fax function and remove from pre-op pool.  Please call with questions.  Thomasene Ripple. Pierra Skora NP-C     02/17/2024, 2:42 PM [facility] [address] Office [phone] Fax [phone]

## 2024-02-17 NOTE — Progress Notes (Unsigned)
 Cardiology Office Note  Date: 02/17/2024   ID: Sylvia, French [birth year], MRN [id number]  PCP:  Benita Stabile, MD  Cardiologist:  Marjo Bicker, MD Electrophysiologist:  None    History of Present Illness: [name] is a 83 y.o. female known to have paroxysmal atrial flutter s/p DCCV in 2024, HTN is here for follow-up visit.  Patient takes care of her husband that has advanced Alzheimer's dementia.  He was diagnosed with Alzheimer's dementia in 2016 and has been worsening. As of now, her daughters and caretakers help take care of her husband. Patient says she was stressed out about taking care of her husband.  Patient has been symptomatic with SOB since 12/2022 and EKG showed atrial flutter for which she underwent DCCV in 01/2023. Completed 6 months therapy of amiodarone.  She had a recent visit with pulmonology where she complained of DOE and BNP was elevated to 1000s.  She was initially on p.o. Lasix 20 mg which was increased to 40 mg once daily.  She is here for follow-up visit.  EKG today showed NSR.  She reported improvement in her symptoms after increasing the Lasix to 40 mg once daily and also after taking her inhalers twice daily (previously she was taking it once a daily).  No other symptoms of chest pain, dizziness, presyncope and syncope.  No leg swelling.  Past Medical History:  Diagnosis Date   Anemia    Anxiety    Arthritis    Asthma    Atrial flutter (HCC)    Constipation    Dysrhythmia    GERD (gastroesophageal reflux disease)    High blood pressure    History of COVID-19 12/28/2020   Hypothyroidism    PAD (peripheral artery disease) (HCC)    bilateral legs   Peripheral edema    Peripheral neuropathy    bilateral feet at night occ   Psoriasis    Seasonal allergies    Skin cancer    Left eye brow   Varicose veins    Vertigo 11/23/2013    Past Surgical History:  Procedure Laterality Date   APPENDECTOMY     patient unsure   BIOPSY   12/21/2020   Procedure: BIOPSY;  Surgeon: Corbin Ade, MD;  [facility];  Service: Endoscopy;;   BIOPSY  05/28/2021   Procedure: BIOPSY;  Surgeon: Lanelle Bal, DO;  [facility];  Service: Endoscopy;;   CARDIOVERSION N/A 04/04/2023   Procedure: CARDIOVERSION;  Surgeon: Antoine Poche, MD;  [facility];  Service: Endoscopy;  Laterality: N/A;   CATARACT EXTRACTION W/PHACO Left 01/27/2017   Procedure: CATARACT EXTRACTION PHACO AND INTRAOCULAR LENS PLACEMENT (IOC);  Surgeon: Gemma Payor, MD;  [facility];  Service: Ophthalmology;  Laterality: Left;  CDE: 29.90   CATARACT EXTRACTION W/PHACO Right 02/24/2017   Procedure: CATARACT EXTRACTION PHACO AND INTRAOCULAR LENS PLACEMENT (IOC);  Surgeon: Gemma Payor, MD;  [facility];  Service: Ophthalmology;  Laterality: Right;  CDE: 8.57   CHOLECYSTECTOMY     COLONOSCOPY N/A 10/01/2016   Procedure: COLONOSCOPY;  Surgeon: Corbin Ade, MD; pancolonic diverticulosis, melanosis coli, otherwise normal exam.  No recommendations to repeat colonoscopy.   COLONOSCOPY WITH PROPOFOL N/A 12/21/2020   Rourk: Diverticulosis   ESOPHAGOGASTRODUODENOSCOPY (EGD) WITH PROPOFOL N/A 12/21/2020   Rourk: Erosive reflux esophagitis, nonbleeding gastric ulcers with biopsy showing erosion/ulceration, negative for H. pylori.   ESOPHAGOGASTRODUODENOSCOPY (EGD) WITH PROPOFOL N/A 05/28/2021   Carver: gastritis (reactive gastropathy),  no H.pylori   FLEXOR TENDON REPAIR Right 11/06/2017   Procedure: RIGHT WRIST FLEXOR TENDON REPAIRAND STT DEBRIEDMENT;  Surgeon: Betha Loa, MD;  Location: [facility];  Service: Orthopedics;  Laterality: Right;   MOUTH SURGERY     artery bleed   TOTAL HIP ARTHROPLASTY Right 01/31/2021   Procedure: TOTAL HIP ARTHROPLASTY ANTERIOR APPROACH;  Surgeon: Samson Frederic, MD;  Location: [facility];  Service: Orthopedics;  Laterality: Right;   TUBAL LIGATION      Current Outpatient Medications   Medication Sig Dispense Refill   acetaminophen (TYLENOL) 500 MG tablet Take 500-1,000 mg by mouth as needed for mild pain or moderate pain.     albuterol (VENTOLIN HFA) 108 (90 Base) MCG/ACT inhaler Inhale 2 puffs into the lungs every 6 (six) hours as needed for wheezing or shortness of breath. 8 g 1   ALPRAZolam (XANAX) 0.5 MG tablet Take 0.5 mg by mouth in the morning, at noon, and at bedtime. (1000, 1500, AT BEDTIME)     amoxicillin (AMOXIL) 500 MG capsule Take 1 capsule every 8 hours by oral route for 5 days.     apixaban (ELIQUIS) 5 MG TABS tablet Take 1 tablet (5 mg total) by mouth 2 (two) times daily. 60 tablet 11   azelastine (ASTELIN) 0.1 % nasal spray Place 1 spray into both nostrils 2 (two) times daily. 30 mL 5   Budeson-Glycopyrrol-Formoterol (BREZTRI AEROSPHERE) 160-9-4.8 MCG/ACT AERO Inhale 2 puffs into the lungs daily. 10.7 g 11   famotidine (PEPCID) 20 MG tablet TAKE 1 TABLET BY MOUTH DAILY AFTER SUPPER 30 tablet 5   ferrous sulfate 324 MG TBEC Take 324 mg by mouth.     fluocinonide gel (LIDEX) 0.05 % Apply 1 Application topically 2 (two) times daily.     fluticasone (FLONASE) 50 MCG/ACT nasal spray USE 2 SPRAYS INTO BOTH NOSTRILS EVERY MORNING 16 g 4   furosemide (LASIX) 40 MG tablet TAKE ONE TABLET BY MOUTH EVERY DAY. MAY TAKE ADDITIONAL TABLET AS NEEDED IF SHORT OF BREATH CONTINUES 90 tablet 1   levothyroxine (SYNTHROID) 50 MCG tablet Take 1 tablet (50 mcg total) by mouth daily before breakfast. 90 tablet 3   losartan (COZAAR) 50 MG tablet Take 50 mg by mouth 2 (two) times daily.     Multiple Vitamin (MULTIVITAMIN WITH MINERALS) TABS tablet Take 1 tablet by mouth daily in the afternoon.     NONFORMULARY OR COMPOUNDED ITEM Apply 1 Application topically 3 (three) times daily as needed (nerve pain.). Baclofen 2%/Diclofenac Sodium 3%/Gabapentin 5%/Lidocaine HCl 5%/Menthol 1% Topical Gel     pantoprazole (PROTONIX) 40 MG tablet TAKE 1 TABLET BY MOUTH ONCE A DAY. 30 tablet 11    polyethylene glycol (MIRALAX / GLYCOLAX) 17 g packet Take 17 g by mouth every evening.     potassium chloride (KLOR-CON) 10 MEQ tablet Take 2 tablets (20 mEq total) by mouth daily. 180 tablet 3   raloxifene (EVISTA) 60 MG tablet Take 1 tablet (60 mg total) by mouth every morning. (Patient taking differently: Take 60 mg by mouth every other day. IN THE MORNING) 30 tablet 12   SODIUM FLUORIDE 5000 PPM 1.1 % PSTE Take by mouth at bedtime.     venlafaxine XR (EFFEXOR-XR) 75 MG 24 hr capsule Take 75 mg by mouth daily after breakfast.     vitamin D3 (CHOLECALCIFEROL) 25 MCG tablet Take 1,000 Units by mouth daily.     No current facility-administered medications for this visit.   Allergies:  Amoxicillin-pot clavulanate,  Ace inhibitors, Guaifenesin, Lisinopril, Cetirizine, Clarithromycin, Entex la, Gabapentin, Levofloxacin, and Meloxicam   Social History: The patient  reports that she quit smoking about 26 years ago. Her smoking use included cigarettes. She started smoking about 31 years ago. She has a 1.3 pack-year smoking history. She has been exposed to tobacco smoke. She has never used smokeless tobacco. She reports that she does not currently use alcohol after a past usage of about 1.0 standard drink of alcohol per week. She reports that she does not use drugs.   Family History: The patient's family history includes COPD in her brother; Heart attack in her father; Heart disease in her father; Hypertension in her brother and mother; Varicose Veins in her father.   ROS:  Please see the history of present illness. Otherwise, complete review of systems is positive for none.  All other systems are reviewed and negative.   Physical Exam: VS:  There were no vitals taken for this visit., BMI There is no height or weight on file to calculate BMI.  Wt Readings from Last 3 Encounters:  02/17/24 154 lb 6.4 oz (70 kg)  02/04/24 152 lb 3.2 oz (69 kg)  01/20/24 152 lb 12.8 oz (69.3 kg)    General: Patient  appears comfortable at rest. HEENT: Conjunctiva and lids normal, oropharynx clear with moist mucosa. Neck: Supple, no elevated JVP or carotid bruits, no thyromegaly. Lungs: Clear to auscultation, nonlabored breathing at rest. Cardiac: Regular rate and rhythm, no S3 or significant systolic murmur, no pericardial rub. Abdomen: Soft, nontender, no hepatomegaly, bowel sounds present, no guarding or rebound. Extremities: 1+ pitting edema around the ankle Skin: Warm and dry. Musculoskeletal: No kyphosis. Neuropsychiatric: Alert and oriented x3, affect grossly appropriate.  Recent Labwork: 02/20/2023: ALT 18; AST 24 04/22/2023: Magnesium 2.0 07/21/2023: BNP 1,029.7 11/11/2023: BUN 18; Creatinine, Ser 1.06; Potassium 3.5; Sodium 135 01/22/2024: Hemoglobin 10.5; Platelets 233; TSH 2.510     Component Value Date/Time   TRIG 92 09/11/2020 1547    Assessment and Plan: Patient is a 83 year old F known to have paroxysmal atrial flutter s/p DCCV in 01/2023, chronic diastolic heart failure, HTN is here for follow-up visit  Chronic diastolic heart failure: Worsening DOE with elevation of BNP in 1000s, significant improvement in DOE after increasing p.o. Lasix from 20 mg to 40 mg once daily. Continue p.o. Lasix 40 mg once daily. Instructed her to take an additional dose of Lasix if she continues to have SOB.  Will get BMP before the next clinic visit.  Paroxysmal atrial flutter s/p DCCV in 3/24, CV score 3: EKG today showed normal sinus rhythm.  Currently not on any rate controlling agents.  Continue Eliquis 5 mg twice daily.  Mild aortic regurgitation and mild mitral regurgitation in 2024 echo: Next echo will be in 2027, serial surveillance with echocardiograms.  HTN, controlled: Did not tolerate amlodipine due to bilateral lower EXTR swelling that was switched to Lasix previously.  Continue losartan 50 mg twice daily, follows with PCP.    Disposition:  Follow up  3 months in  Hindman, Janise Gora Verne Spurr, MD, 02/17/2024 1:15 PM    [facility] at [facility] [address] [place], [place] [address]

## 2024-02-17 NOTE — Telephone Encounter (Signed)
  Request for patient to stop medication prior to procedure or is needing cleareance  02/17/24  [name] [birth year]  What type of surgery is being performed? Colonoscopy/possible Esophagogastroduodenoscopy (EGD)  When is surgery scheduled? TBD  What type of clearance is required (medical or pharmacy to hold medication or both? medication  Are there any medications that need to be held prior to surgery and how long? Eliquis x 2 days  Name of physician performing surgery?  Dr. Jaci Lazier [facility] at [facility] Phone: [phone] Fax: [phone]  Anethesia type (none, local, MAC, general)? MAC

## 2024-02-17 NOTE — Patient Instructions (Signed)
.  Medication Instructions:  Your physician has recommended you make the following change in your medication:  Start Farxiga 10 mg once daily Continue taking all other medications as prescribed   Labwork: BMP in one week at [facility] in [place]  Testing/Procedures: None  Follow-Up: Your physician recommends that you schedule a follow-up appointment in: 6 months  Any Other Special Instructions Will Be Listed Below (If Applicable). Thank you for choosing  [facility]!     If you need a refill on your cardiac medications before your next appointment, please call your pharmacy.

## 2024-02-18 ENCOUNTER — Encounter: Payer: Self-pay | Admitting: Internal Medicine

## 2024-02-18 ENCOUNTER — Other Ambulatory Visit: Payer: Self-pay | Admitting: Internal Medicine

## 2024-02-18 NOTE — Progress Notes (Signed)
 Subjective: Chief Complaint  Patient presents with   Foot Ulcer    Rm#11 Follow up on right foot ulcer patient states using insert and finds it to be helping.Discharged from wound center last week.    83 year old female presents the office with above concerns.  States that she has been doing well.  She been released by the wound care center.  Denies any drainage or pus or increasing swelling or redness.  No open lesions or other concerns today.  No injuries.  Objective: AAO x3, NAD DP/PT pulses palpable bilaterally, CRT less than 3 seconds On the plantar aspect of the toe there is still hyperkeratotic tissue.  Upon debridement there is no underlying ulceration, drainage or signs of infection.  There is 1 pinpoint type area of dried blood under the callus there is no skin breakdown.  There is no surrounding erythema, ascending cellulitis.  There is no fluctuation or crepitation.  There is no malodor.  No new ulcerations identified.  Digital contracture present.   No pain with calf compression, swelling, warmth, erythema  Assessment: Ulceration right hallux- resolved  Plan: -All treatment options discussed with the patient including all alternatives, risks, complications.  -Sharply debrided the preulcerative callus on the hallux today with any complications or bleeding.  The wound appears to be healed but still would recommend moisturizer at night and keep a bandage on the toe for protection.  Continue with shoes, orthotics for offloading but seem to be helping at this time.  If needed will consider further adjustments to the inserts.  Discussed monitoring for any signs or symptoms of infection or reoccurrence of the wound.  Return in about 4 weeks (around 03/15/2024) for pre-ulcerative callus.  [name] DPM

## 2024-02-18 NOTE — Telephone Encounter (Signed)
 Prescription refill request for Eliquis received. Indication: AF Last office visit: 02/17/24  Lenord Carbo MD Scr: 1.06 on 11/11/23  Epic Age: 83 Weight: 69.5kg  Based on above findings Eliquis 5mg  twice daily is the appropriate dose.  Refill approved.

## 2024-02-19 ENCOUNTER — Other Ambulatory Visit (HOSPITAL_COMMUNITY): Payer: Self-pay

## 2024-02-19 ENCOUNTER — Telehealth: Payer: Self-pay

## 2024-02-19 NOTE — Telephone Encounter (Signed)
 Ok to schedule.

## 2024-02-19 NOTE — Telephone Encounter (Signed)
 Pharmacy Patient Advocate Encounter  Insurance verification completed.   The patient is insured through [employer] for [employer] Currently a quantity of 30 is a 30 day supply and the co-pay is $253 . The current 30 day co-pay is, $253.  No PA needed at this time.  This test claim was processed through [facility]- copay amounts may vary at other pharmacies due to pharmacy/plan contracts, or as the patient moves through the different stages of their insurance plan.   Pharmacy Patient Advocate Encounter  Insurance verification completed.   The patient is insured through [employer] claim for [place]. Currently a quantity of 30 is a 30 day supply and the co-pay is $47 . The current 30 day co-pay is, $$47.  No PA needed at this time.  This test claim was processed through [facility]- copay amounts may vary at other pharmacies due to pharmacy/plan contracts, or as the patient moves through the different stages of their insurance plan.

## 2024-02-20 NOTE — Telephone Encounter (Signed)
 Will schedule pt once get providers May schedule

## 2024-02-21 NOTE — Progress Notes (Unsigned)
 Subjective:    Patient ID: [name], female   DOB: 03/17/1941    MRN: [id number]    Brief patient profile:  43 yowf  Quit smokng  1999 in "perfect health" until  around 2012 cough/sob dx copd [place] but spirometry was wnl  08/12/12 and did fine on symbicort then changed over to spiriva respimat and good until Dec 2017 then bad cough/ wheeze  rx  UC pred x 2 and back on symbicort referred to pulmonary clinic 01/24/2017 by Dr Margo Aye for re-eval ? Copd    History of Present Illness  01/24/2017 1st [facility] office visit/ Larrisa Cravey   Chief Complaint  Patient presents with   Pulmonary Consult    Self referral for COPD.  She states that she was dxed in 2012. She states she was dxed with Bronchitis end of Dec 2017 and feels like she is just now getting over this. She c/o increased SOB over the past few wks and was started on Symbicort by PCP and this has helped some.   MMRC3 = can't walk 100 yards even at a slow pace at a flat grade s stopping due to sob  Can do HT but not [employer]  Sleeps fine/ cough starts up w/in min mucus light yellow / sorethroat esp in afternoons  Plan A = Automatic = symbicort 80 Take 2 puffs first thing in am and then another 2 puffs about 12 hours later and stop spiriva  Work on inhaler technique:   Plan B = Backup Only use your albuterol as a rescue medication t Pantoprazole (protonix) 40 mg   Take  30-60 min before first meal of the day and Pepcid (famotidine)  20 mg one @  bedtime until return to office - this is the best way to tell whether stomach acid is contributing to your problem.   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 Has seen Dr. Sherene Sires before.   Dyspnea:  150 ft to out building flat gets sob every time x one year prior to OV   but not really progressing  Cough: 1st thing in am then  better for rest of day  p cough up min mucoid sputum Sleeping: bed is flat/ one pillow  SABA use: once a week  02: none  Covid status: vax max   Rec Pantoprazole (protonix) 40 mg   Take  30-60 min before first meal of the day and Pepcid (famotidine)  20 mg after supper or at bedtime   Plan A = Automatic = Always=   Symbicort 80 Take 2 puffs first thing in am and then another 2 puffs about 12 hours later.  Work on inhaler technique:  Plan B = Backup (to supplement plan A, not to replace it) Only use your albuterol inhaler as a rescue medication  Please remember to go to the lab department    - Allergy screen 08/01/2022 >  Eos 0.1 /  IgE  125.         09/12/2022  f/u ov/ office/Vollie Aaron re: cough variant asthma  maint on symb 80 2bid  though hfa not optimal  Chief Complaint  Patient presents with   Follow-up    Patient has not been feeling well the last few  weeks. Was walking more before he got sick and was feeling better but has not been walking since she has been sick/not feeling well.   Dyspnea:  15 min twice daily walking  Cough: worse with onset of "allergies" but not better with prednisone / worse p stirs does not keep her up /min mucoid Sleeping: flat bed s resp cc  SABA use: none  02: none  Rec Work on inhaler technique:  GERD diet reviewed, bed blocks rec  See Dr Dellis Anes next if not satisfied > dx  mild asthma      07/21/2023  f/u ov/[place] office/Saron Tweed re: cough variant asthma  maint on symb 80  with marginal technique Chief Complaint  Patient presents with   Establish Care  Main complaint is nasal congestion Dyspnea:  walking at [employer]/ pushing cart x 2 aisles = MMRC3 = can't walk 100 yards even at a slow pace at a flat grade s stopping due to sob   Cough: 1st thing in am / yellow but min vol  Sleeping: flat bed/ two pillow no resp cc  SABA use: not sure it helps  02: not using  Rec Make sure  you check your oxygen saturation at your highest level of activity (NOT after you stop)  to be sure it stays over 90%  Ok to try albuterol 15 min before an activity (on alternating days)  that you know would usually make you short of breath  Work on inhaler technique:  Cxr nl Prednisone 10 mg take  4 each am x 2 days,   2 each am x 2 days,  1 each am x 2 days and stop   BNP > 1000 referred back to cards > increased lasix and improved doe Please schedule a follow up office visit in 4 weeks, sooner if needed  with all medications /inhalers/ solutions in hand        02/23/2024  f/u ov/ office/Shayma Pfefferle re: doe / cough  maint on breztri per Allergy  with good hfa   Dyspnea:  mopping and vacuuming are main problems  / walking fast to mb is slt uphill / back to house is fine   Cough: none  Sleeping: no resp flat bed one pillow     SABA use: p exertion  - never tried prior     No obvious day to day or daytime variability or assoc excess/ purulent sputum or mucus plugs or hemoptysis or cp or chest tightness, subjective wheeze or overt   hb symptoms.    Also denies any obvious fluctuation of symptoms with weather or environmental changes or other aggravating or alleviating factors except as outlined above   No unusual exposure hx or h/o childhood pna/ asthma or knowledge of premature birth.  Current Allergies, Complete Past Medical History, Past Surgical History, Family History, and Social History were reviewed in [employer] record.  ROS  The following are not active complaints unless bolded Hoarseness, sore throat, dysphagia, dental problems, itching, sneezing,  nasal congestion or discharge of excess mucus or purulent secretions, ear ache,   fever, chills, sweats, unintended wt loss or wt gain, classically pleuritic or exertional cp,  orthopnea pnd or arm/hand swelling  or leg swelling, presyncope, palpitations, abdominal pain, anorexia, nausea, vomiting, diarrhea  or  change in bowel habits or change in bladder habits, change in stools or change in urine, dysuria, hematuria,  rash, arthralgias, visual complaints, headache, numbness, weakness or ataxia or problems with walking or coordination,  change in mood or  memory.        Current Meds  Medication Sig   acetaminophen (TYLENOL) 500 MG tablet Take 500-1,000 mg by mouth as needed for mild pain or moderate pain.   albuterol (VENTOLIN HFA) 108 (90 Base) MCG/ACT inhaler Inhale 2 puffs into the lungs every 6 (six) hours as needed for wheezing or shortness of breath.   ALPRAZolam (XANAX) 0.5 MG tablet Take 0.5 mg by mouth in the morning, at noon, and at bedtime. (1000, 1500, AT BEDTIME)   apixaban (ELIQUIS) 5 MG TABS tablet TAKE (1) TABLET BY MOUTH TWICE DAILY.   azelastine (ASTELIN) 0.1 % nasal spray Place 1 spray into both nostrils 2 (two) times daily.   Budeson-Glycopyrrol-Formoterol (BREZTRI AEROSPHERE) 160-9-4.8 MCG/ACT AERO Inhale 2 puffs into the lungs daily.   famotidine (PEPCID) 20 MG tablet TAKE 1 TABLET BY MOUTH DAILY AFTER SUPPER   ferrous sulfate 324 MG TBEC Take 324 mg by mouth.   fluocinonide gel (LIDEX) 0.05 % Apply 1 Application topically 2 (two) times daily.   fluticasone (FLONASE) 50 MCG/ACT nasal spray USE 2 SPRAYS INTO BOTH NOSTRILS EVERY MORNING   furosemide (LASIX) 40 MG tablet TAKE ONE TABLET BY MOUTH EVERY DAY. MAY TAKE ADDITIONAL TABLET AS NEEDED IF SHORT OF BREATH CONTINUES   levothyroxine (SYNTHROID) 50 MCG tablet Take 1 tablet (50 mcg total) by mouth daily before breakfast.   losartan (COZAAR) 50 MG tablet Take 50 mg by mouth 2 (two) times daily.   Multiple Vitamin (MULTIVITAMIN WITH MINERALS) TABS tablet Take 1 tablet by mouth daily in the afternoon.   NONFORMULARY OR COMPOUNDED ITEM Apply 1 Application topically 3 (three) times daily as needed (nerve pain.). Baclofen 2%/Diclofenac Sodium 3%/Gabapentin 5%/Lidocaine HCl 5%/Menthol 1% Topical Gel   pantoprazole (PROTONIX) 40 MG tablet  TAKE 1 TABLET BY MOUTH ONCE A DAY.   polyethylene glycol (MIRALAX / GLYCOLAX) 17 g packet Take 17 g by mouth every evening.   potassium chloride (KLOR-CON) 10 MEQ tablet Take 2 tablets (20 mEq total) by mouth daily.   raloxifene (EVISTA) 60 MG tablet Take 1 tablet (60 mg total) by mouth every morning. (Patient taking differently: Take 60 mg by mouth every other day. IN THE MORNING)   SODIUM FLUORIDE 5000 PPM 1.1 % PSTE Take by mouth at bedtime.   venlafaxine XR (EFFEXOR-XR) 75 MG 24 hr capsule Take 75 mg by mouth daily after breakfast.   vitamin D3 (CHOLECALCIFEROL) 25 MCG tablet Take 1,000 Units by mouth daily.           Objective:   Physical Exam   Wts   02/23/2024       152  08/20/2023       152  07/21/2023       154   09/12/2022     152 08/01/2022       149   04/15/2017       158   01/24/17 158 lb (71.7 kg)  01/22/17 157 lb (71.2 kg)  09/25/16 165 lb (74.8 kg)    Vital signs reviewed  02/23/2024  - Note at rest 02 sats  94% on RA    General appearance:    amb wf nad    HEENT : Oropharynx  clear      Nasal turbinates mild non-specific edema   NECK :  without  apparent JVD/ palpable Nodes/TM    LUNGS: no acc muscle use,  Nl contour chest which is clear to A and P bilaterally without cough  on insp or exp maneuvers   CV:  RRR  no s3 or murmur or increase in P2, and Min pitting sym LE despite elastic hose   ABD:  soft and nontender   MS:  Gait nl      SKIN: warm and dry without lesions    NEURO:  alert, approp, nl sensorium with  no motor or cerebellar deficits apparent.          Assessment:

## 2024-02-23 ENCOUNTER — Ambulatory Visit: Payer: Medicare HMO | Admitting: Internal Medicine

## 2024-02-23 ENCOUNTER — Encounter: Payer: Self-pay | Admitting: Internal Medicine

## 2024-02-23 ENCOUNTER — Telehealth: Payer: Self-pay

## 2024-02-23 VITALS — BP 158/79 | HR 89 | Ht 64.0 in | Wt 152.0 lb

## 2024-02-23 DIAGNOSIS — J45991 Cough variant asthma: Secondary | ICD-10-CM | POA: Diagnosis not present

## 2024-02-23 DIAGNOSIS — Z79899 Other long term (current) drug therapy: Secondary | ICD-10-CM | POA: Diagnosis not present

## 2024-02-23 DIAGNOSIS — I4819 Other persistent atrial fibrillation: Secondary | ICD-10-CM | POA: Diagnosis not present

## 2024-02-23 DIAGNOSIS — R0609 Other forms of dyspnea: Secondary | ICD-10-CM | POA: Diagnosis not present

## 2024-02-23 MED ORDER — EMPAGLIFLOZIN 10 MG PO TABS: 10.0000 mg | ORAL_TABLET | Freq: Every day | ORAL | 5 refills | Status: DC

## 2024-02-23 NOTE — Telephone Encounter (Signed)
 Spoke with patient to let her know that Dr. Jenene Slicker stated that she can switch to Jardiance 10 mg once daily and to send in to requested pharmacy. Advised her it was on the preferred list according to her pharmacy. Per Dr. Jenene Slicker: [name] to switch to [facility]. This is for her diastolic heart failure, to help her breathe better. Patient verbalized understanding

## 2024-02-23 NOTE — Assessment & Plan Note (Addendum)
 Onset 2102 p stopped smoking 1999 Spirometry 08/12/2012 FEV1 2.23 (98%)  Ratio 79  ? On symbicort  - 01/24/2017  Walked RA x 3 laps @ 185 ft each stopped due to  End of study, nl pace, no   desat   Min sob - Spirometry 04/15/2017  Completely nl including curvature  - 04/15/2017  Walked RA x 3 laps @ 185 ft each stopped due to  End of study, nl pace, min sob, no desat  - 08/01/2022   Walked on RA  x  3  lap(s) =  approx 450  ft  @ mod to fast pace, stopped due to end of study  with lowest 02 sats  96%  With minimal sob  - 07/21/2023   Walked on  RA   x  2  lap(s) =  approx 300  ft  @ nl pace, stopped due to  fatigue > SOB with lowest 02 sats 94%  She does not meet copd criteria but could have component of cardiac asthma in which case the LAMA in Breztri may be helpful as long as does not aggravate the UACS component of her problem.  Most likely doe is due to deconditioning and diastolic dysfunction > f/u cards planned/ f/u here prn    Re garding use of saba in pt on symbicort or breztri maint rx; Re SABA :  I spent extra time with pt today reviewing appropriate use of albuterol for prn use on exertion with the following points: 1) saba is for relief of sob that does not improve by walking a slower pace or resting but rather if the pt does not improve after trying this first. 2) If the pt is convinced, as many are, that saba helps recover from activity faster then it's easy to tell if this is the case by re-challenging : ie stop, take the inhaler, then p 5 minutes try the exact same activity (intensity of workload) that just caused the symptoms and see if they are substantially diminished or not after saba 3) if there is an activity that reproducibly causes the symptoms, try the saba 15 min before the activity on alternate days   If in fact the saba really does help, then fine to continue to use it prn but advised may need to look closer at the maintenance regimen (symbicort 80 vs breztri) being used to  achieve better control of airways disease with exertion.           Each maintenance medication was reviewed in detail including emphasizing most importantly the difference between maintenance and prns and under what circumstances the prns are to be triggered using an action plan format where appropriate.  Total time for H and P, chart review, counseling, reviewing hfa device(s) and generating customized AVS unique to this office visit / same day charting = 30 min final summary f/u ov.

## 2024-02-23 NOTE — Assessment & Plan Note (Signed)
 Allergy profile 01/24/2017 >  Eos 0.3 /  IgE  702  Dog > cat,Trees/grass/ ragweed    - 01/24/2017  continue symb 80 2bid  - Sinus CT 01/24/2017 >>> No evidence of sinusitis.  - Spirometry 04/15/2017  FEV1 2.11 (100%)  Ratio 80 with nl contour in effort indep portion  - 08/01/2022  After extensive coaching inhaler device,  effectiveness =    90% increase symb 80 to 2 bid and max rx for gerd x 6 weeks then return to regroup - Allergy screen 08/01/2022 >  Eos 0.1 /  IgE  125 - 08/20/2023     - 02/23/2024 on breztri > deferred further adjustments to Allergy  -  02/23/2024  After extensive coaching inhaler device,  effectiveness =    90% effective   Pulmonary f/u is prn

## 2024-02-23 NOTE — Patient Instructions (Addendum)
 Also  Ok to try albuterol 15 min before an activity (on alternating days)  that you know would usually make you short of breath and see if it makes any difference and if makes none then don't take albuterol after activity unless you can't catch your breath as this means it's the resting that helps, not the albuterol.   Pulmonary follow up is as needed

## 2024-02-24 LAB — BASIC METABOLIC PANEL
BUN/Creatinine Ratio: 20 (ref 12–28)
BUN: 18 mg/dL (ref 8–27)
CO2: 23 mmol/L (ref 20–29)
Calcium: 9 mg/dL (ref 8.7–10.3)
Chloride: 101 mmol/L (ref 96–106)
Creatinine, Ser: 0.92 mg/dL (ref 0.57–1.00)
Glucose: 94 mg/dL (ref 70–99)
Potassium: 4.6 mmol/L (ref 3.5–5.2)
Sodium: 140 mmol/L (ref 134–144)
eGFR: 63 mL/min/{1.73_m2} (ref >59)

## 2024-02-25 ENCOUNTER — Telehealth: Payer: Self-pay

## 2024-02-25 DIAGNOSIS — Z79899 Other long term (current) drug therapy: Secondary | ICD-10-CM

## 2024-02-25 DIAGNOSIS — I5032 Chronic diastolic (congestive) heart failure: Secondary | ICD-10-CM

## 2024-02-25 NOTE — Telephone Encounter (Signed)
-----   Message from Vishnu P Mallipeddi sent at 02/24/2024 10:56 AM EDT ----- Was this BMP obtained 5 days after taking Jardiance?  Because there was a delay in obtaining Jardiance due to insurance issues.

## 2024-02-25 NOTE — Telephone Encounter (Signed)
 Advised patient to have lab completed again due to she hadn't started new medication and their was insurance issues. Patient verbalized understanding. Mailing lab order out to patient.

## 2024-02-28 DIAGNOSIS — Z7901 Long term (current) use of anticoagulants: Secondary | ICD-10-CM | POA: Diagnosis not present

## 2024-02-28 DIAGNOSIS — I872 Venous insufficiency (chronic) (peripheral): Secondary | ICD-10-CM | POA: Diagnosis not present

## 2024-02-28 DIAGNOSIS — L97512 Non-pressure chronic ulcer of other part of right foot with fat layer exposed: Secondary | ICD-10-CM | POA: Diagnosis not present

## 2024-02-28 DIAGNOSIS — I890 Lymphedema, not elsewhere classified: Secondary | ICD-10-CM | POA: Diagnosis not present

## 2024-02-28 DIAGNOSIS — F419 Anxiety disorder, unspecified: Secondary | ICD-10-CM | POA: Diagnosis not present

## 2024-02-28 DIAGNOSIS — E11621 Type 2 diabetes mellitus with foot ulcer: Secondary | ICD-10-CM | POA: Diagnosis not present

## 2024-02-28 DIAGNOSIS — K5900 Constipation, unspecified: Secondary | ICD-10-CM | POA: Diagnosis not present

## 2024-02-28 DIAGNOSIS — I509 Heart failure, unspecified: Secondary | ICD-10-CM | POA: Diagnosis not present

## 2024-02-28 DIAGNOSIS — D649 Anemia, unspecified: Secondary | ICD-10-CM | POA: Diagnosis not present

## 2024-03-03 DIAGNOSIS — Z79899 Other long term (current) drug therapy: Secondary | ICD-10-CM | POA: Diagnosis not present

## 2024-03-03 DIAGNOSIS — I5032 Chronic diastolic (congestive) heart failure: Secondary | ICD-10-CM | POA: Diagnosis not present

## 2024-03-03 DIAGNOSIS — C4441 Basal cell carcinoma of skin of scalp and neck: Secondary | ICD-10-CM | POA: Diagnosis not present

## 2024-03-04 LAB — BASIC METABOLIC PANEL WITH GFR
BUN/Creatinine Ratio: 16 (ref 12–28)
BUN: 16 mg/dL (ref 8–27)
CO2: 23 mmol/L (ref 20–29)
Calcium: 9.2 mg/dL (ref 8.7–10.3)
Chloride: 100 mmol/L (ref 96–106)
Creatinine, Ser: 1 mg/dL (ref 0.57–1.00)
Glucose: 90 mg/dL (ref 70–99)
Potassium: 4.5 mmol/L (ref 3.5–5.2)
Sodium: 139 mmol/L (ref 134–144)
eGFR: 57 mL/min/{1.73_m2} — ABNORMAL LOW (ref >59)

## 2024-03-10 ENCOUNTER — Encounter: Payer: Self-pay | Admitting: *Deleted

## 2024-03-10 ENCOUNTER — Other Ambulatory Visit: Payer: Self-pay | Admitting: *Deleted

## 2024-03-10 MED ORDER — NA SULFATE-K SULFATE-MG SULF 17.5-3.13-1.6 GM/177ML PO SOLN: ORAL | 0 refills | Status: DC

## 2024-03-10 NOTE — Telephone Encounter (Signed)
 Pt has been scheduled for 04/12/24. Instructions mailed and prep sent to the pharmacy.    [employer] PA:  Approved Authorization #[id number]  Tracking #[id number] Dates of service 04/12/2024 - 07/12/2024

## 2024-03-11 DIAGNOSIS — E119 Type 2 diabetes mellitus without complications: Secondary | ICD-10-CM | POA: Diagnosis not present

## 2024-03-11 DIAGNOSIS — I10 Essential (primary) hypertension: Secondary | ICD-10-CM | POA: Diagnosis not present

## 2024-03-15 ENCOUNTER — Ambulatory Visit: Admitting: Podiatry

## 2024-03-15 DIAGNOSIS — M79675 Pain in left toe(s): Secondary | ICD-10-CM | POA: Diagnosis not present

## 2024-03-15 DIAGNOSIS — Z7901 Long term (current) use of anticoagulants: Secondary | ICD-10-CM | POA: Diagnosis not present

## 2024-03-15 DIAGNOSIS — L84 Corns and callosities: Secondary | ICD-10-CM

## 2024-03-15 DIAGNOSIS — B351 Tinea unguium: Secondary | ICD-10-CM

## 2024-03-15 DIAGNOSIS — M79674 Pain in right toe(s): Secondary | ICD-10-CM | POA: Diagnosis not present

## 2024-03-17 NOTE — Progress Notes (Signed)
 Subjective: Chief Complaint  Patient presents with   Callouses    RM#13 Return in about 4 weeks (around 03/15/2024) for pre-ulcerative callus right foot/nail trim.   83 year old female presents the office with above concerns.  States that she has been doing well.  She has been released by the wound care center for the calluses come back and she was told to follow-up with us  for this.  She does not report any open lesions or any drainage from the area or any increasing swelling or redness.  She does not report any fevers or chills.  She is asking for the nail to be trimmed there is a thickened elongated she has difficulty doing this herself.  She has no other concerns today.  Objective: AAO x3, NAD DP/PT pulses palpable bilaterally, CRT less than 3 seconds On the plantar aspect of the toe there is still hyperkeratotic tissue.  Upon debridement there is still small area of dried blood present in the callus but there is no underlying ulceration with drainage or signs of infection identified today.  There is no new open lesion identified.   Nails are hypertrophic, dystrophic with yellow, brown discoloration.  She has tenderness nails 1-5 bilaterally.  No edema or erythema to the joint sites.   No pain with calf compression, swelling, warmth, erythema  Assessment: Preulcerative callus; Symptomatic onychomycosis  Plan: Symptomatic onychomycosis -Sharply debrided nails x 10 without any complications or bleeding  Preulcerative callus - Sharply debrided x 1 without any complications or bleeding. - Continue moisturizer, offloading.  Charity Conch DPM

## 2024-03-18 DIAGNOSIS — N289 Disorder of kidney and ureter, unspecified: Secondary | ICD-10-CM | POA: Diagnosis not present

## 2024-03-18 DIAGNOSIS — E039 Hypothyroidism, unspecified: Secondary | ICD-10-CM | POA: Diagnosis not present

## 2024-03-18 DIAGNOSIS — G629 Polyneuropathy, unspecified: Secondary | ICD-10-CM | POA: Diagnosis not present

## 2024-03-18 DIAGNOSIS — E119 Type 2 diabetes mellitus without complications: Secondary | ICD-10-CM | POA: Diagnosis not present

## 2024-03-18 DIAGNOSIS — Z0000 Encounter for general adult medical examination without abnormal findings: Secondary | ICD-10-CM | POA: Diagnosis not present

## 2024-03-18 DIAGNOSIS — D6869 Other thrombophilia: Secondary | ICD-10-CM | POA: Diagnosis not present

## 2024-03-18 DIAGNOSIS — I4892 Unspecified atrial flutter: Secondary | ICD-10-CM | POA: Diagnosis not present

## 2024-03-18 DIAGNOSIS — I10 Essential (primary) hypertension: Secondary | ICD-10-CM | POA: Diagnosis not present

## 2024-03-18 DIAGNOSIS — E876 Hypokalemia: Secondary | ICD-10-CM | POA: Diagnosis not present

## 2024-04-05 ENCOUNTER — Other Ambulatory Visit: Payer: Self-pay | Admitting: Internal Medicine

## 2024-04-07 NOTE — Patient Instructions (Signed)
 [name]  04/07/2024     @PREFPERIOPPHARMACY @   Your procedure is scheduled on  04/12/2024.   Report to [facility] at  0700  A.M.   Call this number if you have problems the morning of surgery:  [phone]  If you experience any cold or flu symptoms such as cough, fever, chills, shortness of breath, etc. between now and your scheduled surgery, please notify us  at the above number.   Remember:        Your last dose of jardiance  should be on 04/08/2024.         Your last dose of eliquis  should be on 04/09/2024.        DO NOT take any medications for diabetes the morning of your procedure.         Use your inhalers before you come and bring your rescue inhaler with you.    Follow the diet and prep instructions given to you by the office.    You may drink clear liquids until  0445 am on 04/12/2024.    Clear liquids allowed are:                    Water , Juice (No red color; non-citric and without pulp; diabetics please choose diet or no sugar options), Carbonated beverages (diabetics please choose diet or no sugar options), Clear Tea (No creamer, milk, or cream, including half & half and powdered creamer), Black Coffee Only (No creamer, milk or cream, including half & half and powdered creamer), Plain Jell-O Only (No red color; diabetics please choose no sugar options), Clear Sports drink (No red color; diabetics please choose diet or no sugar options), and Plain Popsicles Only (No red color; diabetics please choose no sugar options)    Take these medicines the morning of surgery with A SIP OF WATER            alprazolam  (if needed), levothyroxine , pantoprazole , raloxifene , venlafaxine .    Do not wear jewelry, make-up or nail polish, including gel polish,  artificial nails, or any other type of covering on natural nails (fingers and  toes).  Do not wear lotions, powders, or perfumes, or deodorant.  Do not shave 48 hours prior to surgery.  Men may shave face and  neck.  Do not bring valuables to the hospital.  [facility] is not responsible for any belongings or valuables.  Contacts, dentures or bridgework may not be worn into surgery.  Leave your suitcase in the car.  After surgery it may be brought to your room.  For patients admitted to the hospital, discharge time will be determined by your treatment team.  Patients discharged the day of surgery will not be allowed to drive home and must have someone with them for 24 hours.    Special instructions:    DO NOT smoke tobacco or vape for 24 hours before your procedure.  Please read over the following fact sheets that you were given. Anesthesia Post-op Instructions and Care and Recovery After Surgery      Upper Endoscopy, Adult, Care After After the procedure, it is common to have a sore throat. It is also common to have: Mild stomach pain or discomfort. Bloating. Nausea. Follow these instructions at home: The instructions below may help you care for yourself at home. Your health care provider may give you more instructions. If you have questions, ask your health care provider. If you were given a sedative  during the procedure, it can affect you for several hours. Do not drive or operate machinery until your health care provider says that it is safe. If you will be going home right after the procedure, plan to have a responsible adult: Take you home from the hospital or clinic. You will not be allowed to drive. Care for you for the time you are told. Follow instructions from your health care provider about what you may eat and drink. Return to your normal activities as told by your health care provider. Ask your health care provider what activities are safe for you. Take over-the-counter and prescription medicines only as told by your health care provider. Contact a health care provider if you: Have a sore throat that lasts longer than one day. Have trouble swallowing. Have a fever. Get  help right away if you: Vomit blood or your vomit looks like coffee grounds. Have bloody, black, or tarry stools. Have a very bad sore throat or you cannot swallow. Have difficulty breathing or very bad pain in your chest or abdomen. These symptoms may be an emergency. Get help right away. Call 911. Do not wait to see if the symptoms will go away. Do not drive yourself to the hospital. Summary After the procedure, it is common to have a sore throat, mild stomach discomfort, bloating, and nausea. If you were given a sedative during the procedure, it can affect you for several hours. Do not drive until your health care provider says that it is safe. Follow instructions from your health care provider about what you may eat and drink. Return to your normal activities as told by your health care provider. This information is not intended to replace advice given to you by your health care provider. Make sure you discuss any questions you have with your health care provider. Document Revised: 02/27/2022 Document Reviewed: 02/27/2022 Elsevier Patient Education  2024 Elsevier Inc.Colonoscopy, Adult, Care After The following information offers guidance on how to care for yourself after your procedure. Your health care provider may also give you more specific instructions. If you have problems or questions, contact your health care provider. What can I expect after the procedure? After the procedure, it is common to have: A small amount of blood in your stool for 24 hours after the procedure. Some gas. Mild cramping or bloating of your abdomen. Follow these instructions at home: Eating and drinking  Drink enough fluid to keep your urine pale yellow. Follow instructions from your health care provider about eating or drinking restrictions. Resume your normal diet as told by your health care provider. Avoid heavy or fried foods that are hard to digest. Activity Rest as told by your health care  provider. Avoid sitting for a long time without moving. Get up to take short walks every 1-2 hours. This is important to improve blood flow and breathing. Ask for help if you feel weak or unsteady. Return to your normal activities as told by your health care provider. Ask your health care provider what activities are safe for you. Managing cramping and bloating  Try walking around when you have cramps or feel bloated. If directed, apply heat to your abdomen as told by your health care provider. Use the heat source that your health care provider recommends, such as a moist heat pack or a heating pad. Place a towel between your skin and the heat source. Leave the heat on for 20-30 minutes. Remove the heat if your skin turns bright red. This  is especially important if you are unable to feel pain, heat, or cold. You have a greater risk of getting burned. General instructions If you were given a sedative during the procedure, it can affect you for several hours. Do not drive or operate machinery until your health care provider says that it is safe. For the first 24 hours after the procedure: Do not sign important documents. Do not drink alcohol . Do your regular daily activities at a slower pace than normal. Eat soft foods that are easy to digest. Take over-the-counter and prescription medicines only as told by your health care provider. Keep all follow-up visits. This is important. Contact a health care provider if: You have blood in your stool 2-3 days after the procedure. Get help right away if: You have more than a small spotting of blood in your stool. You have large blood clots in your stool. You have swelling of your abdomen. You have nausea or vomiting. You have a fever. You have increasing pain in your abdomen that is not relieved with medicine. These symptoms may be an emergency. Get help right away. Call 911. Do not wait to see if the symptoms will go away. Do not drive yourself to  the hospital. Summary After the procedure, it is common to have a small amount of blood in your stool. You may also have mild cramping and bloating of your abdomen. If you were given a sedative during the procedure, it can affect you for several hours. Do not drive or operate machinery until your health care provider says that it is safe. Get help right away if you have a lot of blood in your stool, nausea or vomiting, a fever, or increased pain in your abdomen. This information is not intended to replace advice given to you by your health care provider. Make sure you discuss any questions you have with your health care provider. Document Revised: 12/31/2022 Document Reviewed: 07/11/2021 Elsevier Patient Education  2024 Elsevier Inc.General Anesthesia, Adult, Care After The following information offers guidance on how to care for yourself after your procedure. Your health care provider may also give you more specific instructions. If you have problems or questions, contact your health care provider. What can I expect after the procedure? After the procedure, it is common for people to: Have pain or discomfort at the IV site. Have nausea or vomiting. Have a sore throat or hoarseness. Have trouble concentrating. Feel cold or chills. Feel weak, sleepy, or tired (fatigue). Have soreness and body aches. These can affect parts of the body that were not involved in surgery. Follow these instructions at home: For the time period you were told by your health care provider:  Rest. Do not participate in activities where you could fall or become injured. Do not drive or use machinery. Do not drink alcohol . Do not take sleeping pills or medicines that cause drowsiness. Do not make important decisions or sign legal documents. Do not take care of children on your own. General instructions Drink enough fluid to keep your urine pale yellow. If you have sleep apnea, surgery and certain medicines can  increase your risk for breathing problems. Follow instructions from your health care provider about wearing your sleep device: Anytime you are sleeping, including during daytime naps. While taking prescription pain medicines, sleeping medicines, or medicines that make you drowsy. Return to your normal activities as told by your health care provider. Ask your health care provider what activities are safe for you. Take over-the-counter and  prescription medicines only as told by your health care provider. Do not use any products that contain nicotine or tobacco. These products include cigarettes, chewing tobacco, and vaping devices, such as e-cigarettes. These can delay incision healing after surgery. If you need help quitting, ask your health care provider. Contact a health care provider if: You have nausea or vomiting that does not get better with medicine. You vomit every time you eat or drink. You have pain that does not get better with medicine. You cannot urinate or have bloody urine. You develop a skin rash. You have a fever. Get help right away if: You have trouble breathing. You have chest pain. You vomit blood. These symptoms may be an emergency. Get help right away. Call 911. Do not wait to see if the symptoms will go away. Do not drive yourself to the hospital. Summary After the procedure, it is common to have a sore throat, hoarseness, nausea, vomiting, or to feel weak, sleepy, or fatigue. For the time period you were told by your health care provider, do not drive or use machinery. Get help right away if you have difficulty breathing, have chest pain, or vomit blood. These symptoms may be an emergency. This information is not intended to replace advice given to you by your health care provider. Make sure you discuss any questions you have with your health care provider. Document Revised: 02/15/2022 Document Reviewed: 02/15/2022 Elsevier Patient Education  2024 [employer].

## 2024-04-08 ENCOUNTER — Encounter (HOSPITAL_COMMUNITY)
Admission: RE | Admit: 2024-04-08 | Discharge: 2024-04-08 | Disposition: A | Discharge status: Home / Self Care | Source: Ambulatory Visit | Attending: Internal Medicine | Admitting: Internal Medicine

## 2024-04-08 VITALS — BP 132/92 | HR 80 | Temp 98.0°F | Resp 18 | Ht 64.0 in | Wt 151.9 lb

## 2024-04-08 DIAGNOSIS — R195 Other fecal abnormalities: Secondary | ICD-10-CM

## 2024-04-08 DIAGNOSIS — D649 Anemia, unspecified: Secondary | ICD-10-CM

## 2024-04-12 ENCOUNTER — Encounter (HOSPITAL_COMMUNITY)
Admission: RE | Disposition: A | Discharge status: Home / Self Care | Payer: Self-pay | Source: Home / Self Care | Attending: Internal Medicine

## 2024-04-12 ENCOUNTER — Ambulatory Visit (HOSPITAL_COMMUNITY)
Admission: RE | Admit: 2024-04-12 | Discharge: 2024-04-12 | Disposition: A | Discharge status: Home / Self Care | Attending: Internal Medicine | Admitting: Internal Medicine

## 2024-04-12 ENCOUNTER — Other Ambulatory Visit: Payer: Self-pay

## 2024-04-12 ENCOUNTER — Ambulatory Visit (HOSPITAL_COMMUNITY): Admitting: Anesthesiology

## 2024-04-12 ENCOUNTER — Encounter (HOSPITAL_COMMUNITY): Payer: Self-pay | Admitting: Internal Medicine

## 2024-04-12 DIAGNOSIS — K5730 Diverticulosis of large intestine without perforation or abscess without bleeding: Secondary | ICD-10-CM

## 2024-04-12 DIAGNOSIS — Z8711 Personal history of peptic ulcer disease: Secondary | ICD-10-CM | POA: Insufficient documentation

## 2024-04-12 DIAGNOSIS — Z87891 Personal history of nicotine dependence: Secondary | ICD-10-CM | POA: Diagnosis not present

## 2024-04-12 DIAGNOSIS — Z1211 Encounter for screening for malignant neoplasm of colon: Secondary | ICD-10-CM | POA: Diagnosis not present

## 2024-04-12 DIAGNOSIS — F419 Anxiety disorder, unspecified: Secondary | ICD-10-CM | POA: Diagnosis not present

## 2024-04-12 DIAGNOSIS — E039 Hypothyroidism, unspecified: Secondary | ICD-10-CM | POA: Diagnosis not present

## 2024-04-12 DIAGNOSIS — G709 Myoneural disorder, unspecified: Secondary | ICD-10-CM | POA: Insufficient documentation

## 2024-04-12 DIAGNOSIS — K449 Diaphragmatic hernia without obstruction or gangrene: Secondary | ICD-10-CM

## 2024-04-12 DIAGNOSIS — K219 Gastro-esophageal reflux disease without esophagitis: Secondary | ICD-10-CM | POA: Insufficient documentation

## 2024-04-12 DIAGNOSIS — K2210 Ulcer of esophagus without bleeding: Secondary | ICD-10-CM | POA: Insufficient documentation

## 2024-04-12 DIAGNOSIS — R195 Other fecal abnormalities: Secondary | ICD-10-CM

## 2024-04-12 DIAGNOSIS — D509 Iron deficiency anemia, unspecified: Secondary | ICD-10-CM | POA: Insufficient documentation

## 2024-04-12 DIAGNOSIS — Z7984 Long term (current) use of oral hypoglycemic drugs: Secondary | ICD-10-CM | POA: Insufficient documentation

## 2024-04-12 DIAGNOSIS — I10 Essential (primary) hypertension: Secondary | ICD-10-CM

## 2024-04-12 DIAGNOSIS — Z8249 Family history of ischemic heart disease and other diseases of the circulatory system: Secondary | ICD-10-CM | POA: Diagnosis not present

## 2024-04-12 DIAGNOSIS — K2100 Gastro-esophageal reflux disease with esophagitis, without bleeding: Secondary | ICD-10-CM | POA: Diagnosis not present

## 2024-04-12 DIAGNOSIS — E1151 Type 2 diabetes mellitus with diabetic peripheral angiopathy without gangrene: Secondary | ICD-10-CM | POA: Diagnosis not present

## 2024-04-12 DIAGNOSIS — F32A Depression, unspecified: Secondary | ICD-10-CM | POA: Diagnosis not present

## 2024-04-12 DIAGNOSIS — J45909 Unspecified asthma, uncomplicated: Secondary | ICD-10-CM | POA: Insufficient documentation

## 2024-04-12 DIAGNOSIS — E119 Type 2 diabetes mellitus without complications: Secondary | ICD-10-CM | POA: Diagnosis not present

## 2024-04-12 HISTORY — PX: COLONOSCOPY: SHX5424

## 2024-04-12 HISTORY — PX: ESOPHAGOGASTRODUODENOSCOPY: SHX5428

## 2024-04-12 LAB — GLUCOSE, CAPILLARY: Glucose-Capillary: 93 mg/dL (ref 70–99)

## 2024-04-12 SURGERY — COLONOSCOPY
Anesthesia: General

## 2024-04-12 MED ORDER — GLYCOPYRROLATE PF 0.2 MG/ML IJ SOSY
PREFILLED_SYRINGE | INTRAMUSCULAR | Status: DC | PRN
  Administered 2024-04-12: .2 mg via INTRAVENOUS

## 2024-04-12 MED ORDER — PROPOFOL 10 MG/ML IV BOLUS
INTRAVENOUS | Status: DC | PRN
  Administered 2024-04-12: 40 mg via INTRAVENOUS
  Administered 2024-04-12: 100 ug/kg/min via INTRAVENOUS

## 2024-04-12 MED ORDER — LIDOCAINE 2% (20 MG/ML) 5 ML SYRINGE
INTRAMUSCULAR | Status: DC | PRN
  Administered 2024-04-12: 80 mg via INTRAVENOUS

## 2024-04-12 MED ORDER — LACTATED RINGERS IV SOLN
INTRAVENOUS | Status: DC | PRN
Start: 2024-04-12 — End: 2024-04-12

## 2024-04-12 NOTE — H&P (Signed)
 @LOGO @   Primary Care Physician:  Omie Bickers, MD Primary Gastroenterologist:  Dr.   Pre-Procedure History & Physical: HPI:  [name] is a 83 y.o. female here for further evaluation of anemia Hemoccult positive stool via EGD colonoscopy denies dysphagia.  History of peptic ulcer disease.  Eliquis  held 3 days ago  Past Medical History:  Diagnosis Date   Anemia    Anxiety    Arthritis    Asthma    Atrial flutter (HCC)    Constipation    Dysrhythmia    GERD (gastroesophageal reflux disease)    High blood pressure    History of COVID-19 12/28/2020   Hypothyroidism    PAD (peripheral artery disease) (HCC)    bilateral legs   Peripheral edema    Peripheral neuropathy    bilateral feet at night occ   Psoriasis    Seasonal allergies    Skin cancer    Left eye brow   Varicose veins    Vertigo 11/23/2013    Past Surgical History:  Procedure Laterality Date   APPENDECTOMY     patient unsure   BIOPSY  12/21/2020   Procedure: BIOPSY;  Surgeon: Suzette Espy, MD;  Location: [facility];  Service: Endoscopy;;   BIOPSY  05/28/2021   Procedure: BIOPSY;  Surgeon: Vinetta Greening, DO;  Location: [facility];  Service: Endoscopy;;   CARDIOVERSION N/A 04/04/2023   Procedure: CARDIOVERSION;  Surgeon: Laurann Pollock, MD;  Location: [facility];  Service: Endoscopy;  Laterality: N/A;   CATARACT EXTRACTION W/PHACO Left 01/27/2017   Procedure: CATARACT EXTRACTION PHACO AND INTRAOCULAR LENS PLACEMENT (IOC);  Surgeon: Anner Kill, MD;  Location: [facility];  Service: Ophthalmology;  Laterality: Left;  CDE: 29.90   CATARACT EXTRACTION W/PHACO Right 02/24/2017   Procedure: CATARACT EXTRACTION PHACO AND INTRAOCULAR LENS PLACEMENT (IOC);  Surgeon: Anner Kill, MD;  Location: [facility];  Service: Ophthalmology;  Laterality: Right;  CDE: 8.57   CHOLECYSTECTOMY     COLONOSCOPY N/A 10/01/2016   Procedure: COLONOSCOPY;  Surgeon: Suzette Espy, MD; pancolonic diverticulosis, melanosis coli,  otherwise normal exam.  No recommendations to repeat colonoscopy.   COLONOSCOPY WITH PROPOFOL  N/A 12/21/2020   Daylon Lafavor: Diverticulosis   ESOPHAGOGASTRODUODENOSCOPY (EGD) WITH PROPOFOL  N/A 12/21/2020   Keola Heninger: Erosive reflux esophagitis, nonbleeding gastric ulcers with biopsy showing erosion/ulceration, negative for H. pylori.   ESOPHAGOGASTRODUODENOSCOPY (EGD) WITH PROPOFOL  N/A 05/28/2021   Carver: gastritis (reactive gastropathy), no H.pylori   FLEXOR TENDON REPAIR Right 11/06/2017   Procedure: RIGHT WRIST FLEXOR TENDON REPAIRAND STT DEBRIEDMENT;  Surgeon: Brunilda Capra, MD;  Location: [facility];  Service: Orthopedics;  Laterality: Right;   MOUTH SURGERY     artery bleed   TOTAL HIP ARTHROPLASTY Right 01/31/2021   Procedure: TOTAL HIP ARTHROPLASTY ANTERIOR APPROACH;  Surgeon: Adonica Hoose, MD;  Location: [facility];  Service: Orthopedics;  Laterality: Right;   TUBAL LIGATION      Prior to Admission medications   Medication Sig Start Date End Date Taking? Authorizing Provider  acetaminophen  (TYLENOL ) 500 MG tablet Take 500-1,000 mg by mouth as needed for mild pain or moderate pain.   Yes [provider]  albuterol  (VENTOLIN  HFA) 108 (90 Base) MCG/ACT inhaler Inhale 2 puffs into the lungs every 6 (six) hours as needed for wheezing or shortness of breath. 06/16/23  Yes Patel, Ammie Bale, MD  ALPRAZolam  (XANAX ) 0.5 MG tablet Take 0.5 mg by mouth in the morning, at noon, and at bedtime. (1000, 1500,  AT BEDTIME)   Yes [provider]  Budeson-Glycopyrrol-Formoterol  (BREZTRI  AEROSPHERE) 160-9-4.8 MCG/ACT AERO Inhale 2 puffs into the lungs daily. 01/09/24  Yes Rochester Chuck, MD  famotidine  (PEPCID ) 20 MG tablet TAKE 1 TABLET BY MOUTH DAILY AFTER SUPPER 04/05/24  Yes Diamond Formica, MD  furosemide  (LASIX ) 40 MG tablet TAKE ONE TABLET BY MOUTH EVERY DAY. MAY TAKE ADDITIONAL TABLET AS NEEDED IF SHORT OF BREATH CONTINUES 12/24/23  Yes Mallipeddi, Vishnu P, MD  levothyroxine   (SYNTHROID ) 50 MCG tablet Take 1 tablet (50 mcg total) by mouth daily before breakfast. 02/04/24  Yes Reardon, Arminda Landmark, NP  losartan  (COZAAR ) 50 MG tablet Take 50 mg by mouth 2 (two) times daily. 01/07/23  Yes [provider]  Multiple Vitamin (MULTIVITAMIN WITH MINERALS) TABS tablet Take 1 tablet by mouth daily in the afternoon.   Yes [provider]  pantoprazole  (PROTONIX ) 40 MG tablet TAKE 1 TABLET BY MOUTH ONCE A DAY. 06/11/23  Yes Lanney Pitts, PA-C  potassium chloride  (KLOR-CON ) 10 MEQ tablet Take 2 tablets (20 mEq total) by mouth daily. 02/11/24  Yes Mallipeddi, Vishnu P, MD  raloxifene  (EVISTA ) 60 MG tablet Take 1 tablet (60 mg total) by mouth every morning. Patient taking differently: Take 60 mg by mouth every other day. IN THE MORNING 04/10/20  Yes Lendia Quay A, NP  venlafaxine  XR (EFFEXOR -XR) 75 MG 24 hr capsule Take 75 mg by mouth daily after breakfast.   Yes [provider]  vitamin D3 (CHOLECALCIFEROL) 25 MCG tablet Take 1,000 Units by mouth daily.   Yes [provider]  apixaban  (ELIQUIS ) 5 MG TABS tablet TAKE (1) TABLET BY MOUTH TWICE DAILY. 02/18/24   Mallipeddi, Vishnu P, MD  azelastine  (ASTELIN ) 0.1 % nasal spray Place 1 spray into both nostrils 2 (two) times daily. 06/16/23   Kandice Orleans, MD  empagliflozin  (JARDIANCE ) 10 MG TABS tablet Take 1 tablet (10 mg total) by mouth daily. 02/23/24   Mallipeddi, Vishnu P, MD  ferrous sulfate 324 MG TBEC Take 324 mg by mouth.    [provider]  fluocinonide gel (LIDEX) 0.05 % Apply 1 Application topically 2 (two) times daily. 07/09/23   [provider]  fluticasone  (FLONASE ) 50 MCG/ACT nasal spray USE 2 SPRAYS INTO BOTH NOSTRILS EVERY MORNING 02/10/24   Rochester Chuck, MD  Na Sulfate-K Sulfate-Mg Sulfate concentrate (SUPREP) 17.5-3.13-1.6 GM/177ML SOLN As directed 03/10/24   Andrius Andrepont, Windsor Hatcher, MD  NONFORMULARY OR COMPOUNDED ITEM Apply 1 Application topically 3 (three) times daily as  needed (nerve pain.). Baclofen 2%/Diclofenac Sodium 3%/Gabapentin  5%/Lidocaine  HCl 5%/Menthol  1% Topical Gel    [provider]  polyethylene glycol (MIRALAX  / GLYCOLAX ) 17 g packet Take 17 g by mouth every evening.    [provider]  SODIUM FLUORIDE 5000 PPM 1.1 % PSTE Take by mouth at bedtime. 09/19/23   [provider]    Allergies as of 03/10/2024 - Review Complete 02/23/2024  Allergen Reaction Noted   Amoxicillin-pot clavulanate Nausea And Vomiting, Other (See Comments), Itching, and Nausea Only 10/04/2013   Ace inhibitors Cough    Guaifenesin Hives and Other (See Comments) 08/01/2022   Lisinopril Cough and Other (See Comments) 08/01/2022   Cetirizine  Other (See Comments) 03/26/2022   Clarithromycin   01/14/2023   Entex la Itching    Gabapentin  Other (See Comments) 03/26/2022   Levofloxacin Hives and Itching 01/21/2017   Meloxicam  Other (See Comments) 04/25/2020    Family History  Problem Relation Age of Onset  Hypertension Mother    Varicose Veins Father    Heart attack Father    Heart disease Father    Hypertension Brother    COPD Brother     Social History   Socioeconomic History   Marital status: Married    Spouse name: Not on file   Number of children: Not on file   Years of education: Not on file   Highest education level: Not on file  Occupational History   Not on file  Tobacco Use   Smoking status: Former    Current packs/day: 0.00    Average packs/day: 0.3 packs/day for 5.0 years (1.3 ttl pk-yrs)    Types: Cigarettes    Start date: 12/02/1992    Quit date: 12/02/1997    Years since quitting: 26.3    Passive exposure: Past   Smokeless tobacco: Never  Vaping Use   Vaping status: Never Used  Substance and Sexual Activity   Alcohol  use: Not Currently    Alcohol /week: 1.0 standard drink of alcohol     Types: 1 Glasses of wine per week   Drug use: No   Sexual activity: Not Currently    Birth control/protection: Surgical     Comment: tubal  Other Topics Concern   Not on file  Social History Narrative   Not on file   Social Drivers of Health   Financial Resource Strain: Low Risk  (04/10/2020)   Overall Financial Resource Strain (CARDIA)    Difficulty of Paying Living Expenses: Not hard at all  Food Insecurity: No Food Insecurity (05/31/2021)   Hunger Vital Sign    Worried About Running Out of Food in the Last Year: Never true    Ran Out of Food in the Last Year: Never true  Transportation Needs: No Transportation Needs (02/19/2022)   PRAPARE - Administrator, Civil Service (Medical): No    Lack of Transportation (Non-Medical): No  Physical Activity: Inactive (04/10/2020)   Exercise Vital Sign    Days of Exercise per Week: 0 days    Minutes of Exercise per Session: 0 min  Stress: Stress Concern Present (04/10/2020)   [employer] of Occupational Health - Occupational Stress Questionnaire    Feeling of Stress : To some extent  Social Connections: Unknown (05/16/2022)   Received from [facility]   Social Network    Social Network: Not on file  Intimate Partner Violence: Unknown (05/16/2022)   Received from [facility], [facility]   HITS    Physically Hurt: Not on file    Insult or Talk Down To: Not on file    Threaten Physical Harm: Not on file    Scream or Curse: Not on file    Review of Systems: See HPI, otherwise negative ROS  Physical Exam: BP (!) 158/85   Pulse 78   Temp 98 F (36.7 C) (Oral)   Resp 11   SpO2 97%  General:   Alert,  Well-developed, well-nourished, pleasant and cooperative in NAD Neck:  Supple; no masses or thyromegaly. No significant cervical adenopathy. Lungs:  Clear throughout to auscultation.   No wheezes, crackles, or rhonchi. No acute distress. Heart:  Regular rate and rhythm; no murmurs, clicks, rubs,  or gallops. Abdomen: Non-distended, normal bowel sounds.  Soft and nontender without appreciable mass or hepatosplenomegaly.    Impression/Plan: 83 year old lady with chronic anemia Hemoccult positive stool history of peptic ulcer disease here for both EGD and colonoscopy.  No dysphagia.The risks, benefits, limitations, imponderables and  alternatives regarding both EGD and colonoscopy have been reviewed with the patient. Questions have been answered. All parties agreeable.    Lengthy discussion with the patient and patient's daughter at the bedside.  All questions answered.  Will decide about continuing PPI or not after endoscopy has been performed.   Notice: This dictation was prepared with Dragon dictation along with smaller phrase technology. Any transcriptional errors that result from this process are unintentional and may not be corrected upon review.

## 2024-04-12 NOTE — Op Note (Signed)
 [facility] Patient Name: [name] Procedure Date: 04/12/2024 8:57 AM MRN: [id number] Date of Birth: [birth year] Attending MD: Gemma Kelp , MD, [id number] CSN: [id number] Age: 83 Admit Type: Outpatient Procedure:                Colonoscopy Indications:              Heme positive stool Providers:                Gemma Kelp, MD, Vonna Guardian, Graydon Lazier                            RN, RN, Annell Barrow Referring MD:              Medicines:                Propofol  per Anesthesia Complications:            No immediate complications. Estimated Blood Loss:     Estimated blood loss: none. Procedure:                Pre-Anesthesia Assessment:                           - Prior to the procedure, a History and Physical                            was performed, and patient medications and                            allergies were reviewed. The patient's tolerance of                            previous anesthesia was also reviewed. The risks                            and benefits of the procedure and the sedation                            options and risks were discussed with the patient.                            All questions were answered, and informed consent                            was obtained. Prior Anticoagulants: The patient                            last took Eliquis  (apixaban ) 3 days prior to the                            procedure. ASA Grade Assessment: III - A patient                            with severe systemic disease. After reviewing the  risks and benefits, the patient was deemed in                            satisfactory condition to undergo the procedure.                           After obtaining informed consent, the colonoscope                            was passed under direct vision. Throughout the                            procedure, the patient's blood pressure, pulse, and                             oxygen  saturations were monitored continuously. The                            [id number]) scope was introduced through the                            anus and advanced to the 5 cm into the ileum. The                            terminal ileum, ileocecal valve, appendiceal                            orifice, and rectum were photographed. The entire                            colon was well visualized. The colonoscopy was                            performed without difficulty. The entire colon was                            well visualized. Scope In: 9:35:18 AM Scope Out: 9:49:35 AM Scope Withdrawal Time: 0 hours 6 minutes 27 seconds  Total Procedure Duration: 0 hours 14 minutes 17 seconds  Findings:      The perianal and digital rectal examinations were normal.      Scattered medium-mouthed diverticula were found in the entire colon.      The exam was otherwise without abnormality on direct and retroflexion       views. Impression:               - Diverticulosis in the entire examined colon.                            Normal-appearing terminal ileum                           - The examination was otherwise normal on direct  and retroflexion views.                           - No specimens collected. Moderate Sedation:      Moderate (conscious) sedation was administered by the nurse and       supervised by the endoscopist. The following parameters were monitored:       oxygen  saturation, heart rate, blood pressure, respiratory rate, EKG,       adequacy of pulmonary ventilation, and response to care. Recommendation:           - Patient has a contact number available for                            emergencies. The signs and symptoms of potential                            delayed complications were discussed with the                            patient. Return to normal activities tomorrow.                            Written discharge instructions were  provided to the                            patient.                           - Advance diet as tolerated.                           - Continue present medications.                           - No repeat colonoscopy due to age.                           - Return to GI clinic in 6 months resume Eliquis                             today. See EGD report. Procedure Code(s):        --- Professional ---                           [phone], Colonoscopy, flexible; diagnostic, including                            collection of specimen(s) by brushing or washing,                            when performed (separate procedure) Diagnosis Code(s):        --- Professional ---                           R19.5, Other fecal abnormalities  K57.30, Diverticulosis of large intestine without                            perforation or abscess without bleeding CPT copyright 2022 [employer]. All rights reserved. The codes documented in this report are preliminary and upon coder review may  be revised to meet current compliance requirements. Windsor Hatcher. Kaelin Bonelli, MD Gemma Kelp, MD 04/12/2024 9:56:48 AM This report has been signed electronically. Number of Addenda: 0

## 2024-04-12 NOTE — Discharge Instructions (Addendum)
 EGD Discharge instructions Please read the instructions outlined below and refer to this sheet in the next few weeks. These discharge instructions provide you with general information on caring for yourself after you leave the hospital. Your doctor may also give you specific instructions. While your treatment has been planned according to the most current medical practices available, unavoidable complications occasionally occur. If you have any problems or questions after discharge, please call your doctor. ACTIVITY You may resume your regular activity but move at a slower pace for the next 24 hours.  Take frequent rest periods for the next 24 hours.  Walking will help expel (get rid of) the air and reduce the bloated feeling in your abdomen.  No driving for 24 hours (because of the anesthesia (medicine) used during the test).  You may shower.  Do not sign any important legal documents or operate any machinery for 24 hours (because of the anesthesia used during the test).  NUTRITION Drink plenty of fluids.  You may resume your normal diet.  Begin with a light meal and progress to your normal diet.  Avoid alcoholic beverages for 24 hours or as instructed by your caregiver.  MEDICATIONS You may resume your normal medications unless your caregiver tells you otherwise.  WHAT YOU CAN EXPECT TODAY You may experience abdominal discomfort such as a feeling of fullness or "gas" pains.  FOLLOW-UP Your doctor will discuss the results of your test with you.  SEEK IMMEDIATE MEDICAL ATTENTION IF ANY OF THE FOLLOWING OCCUR: Excessive nausea (feeling sick to your stomach) and/or vomiting.  Severe abdominal pain and distention (swelling).  Trouble swallowing.  Temperature over 101 F (37.8 C).  Rectal bleeding or vomiting of blood.    Colonoscopy Discharge Instructions  Read the instructions outlined below and refer to this sheet in the next few weeks. These discharge instructions provide you with  general information on caring for yourself after you leave the hospital. Your doctor may also give you specific instructions. While your treatment has been planned according to the most current medical practices available, unavoidable complications occasionally occur. If you have any problems or questions after discharge, call Dr. Riley Cheadle at [phone]. ACTIVITY You may resume your regular activity, but move at a slower pace for the next 24 hours.  Take frequent rest periods for the next 24 hours.  Walking will help get rid of the air and reduce the bloated feeling in your belly (abdomen).  No driving for 24 hours (because of the medicine (anesthesia) used during the test).   Do not sign any important legal documents or operate any machinery for 24 hours (because of the anesthesia used during the test).  NUTRITION Drink plenty of fluids.  You may resume your normal diet as instructed by your doctor.  Begin with a light meal and progress to your normal diet. Heavy or fried foods are harder to digest and may make you feel sick to your stomach (nauseated).  Avoid alcoholic beverages for 24 hours or as instructed.  MEDICATIONS You may resume your normal medications unless your doctor tells you otherwise.  WHAT YOU CAN EXPECT TODAY Some feelings of bloating in the abdomen.  Passage of more gas than usual.  Spotting of blood in your stool or on the toilet paper.  IF YOU HAD POLYPS REMOVED DURING THE COLONOSCOPY: No aspirin  products for 7 days or as instructed.  No alcohol  for 7 days or as instructed.  Eat a soft diet for the next 24 hours.  FINDING  OUT THE RESULTS OF YOUR TEST Not all test results are available during your visit. If your test results are not back during the visit, make an appointment with your caregiver to find out the results. Do not assume everything is normal if you have not heard from your caregiver or the medical facility. It is important for you to follow up on all of your test  results.  SEEK IMMEDIATE MEDICAL ATTENTION IF: You have more than a spotting of blood in your stool.  Your belly is swollen (abdominal distention).  You are nauseated or vomiting.  You have a temperature over 101.  You have abdominal pain or discomfort that is severe or gets worse throughout the day.      You have mild acid reflux burns in your esophagus and a small hiatal hernia.  I recommend you continue continue taking pantoprazole  40 mg daily best taken 30 minutes before breakfast.    No ulcer.  Diverticulosis only found in your colon.    A future colonoscopy is not recommended  Office visit with Azalee Lenz in 6 months;    resume Eliquis  today.    Okay to stop stool softener and Metamucil   at patient request, I called Tiburcio Fly at [phone] -

## 2024-04-12 NOTE — Anesthesia Preprocedure Evaluation (Signed)
 Anesthesia Evaluation  Patient identified by MRN, date of birth, ID band Patient awake    Reviewed: Allergy  & Precautions, H&P , NPO status , Patient's Chart, lab work & pertinent test results, reviewed documented beta blocker date and time   Airway Mallampati: II  TM Distance: >3 FB Neck ROM: full    Dental no notable dental hx.    Pulmonary asthma , former smoker   Pulmonary exam normal breath sounds clear to auscultation       Cardiovascular Exercise Tolerance: Good hypertension, + Peripheral Vascular Disease and + DOE  + dysrhythmias  Rhythm:regular Rate:Normal     Neuro/Psych  PSYCHIATRIC DISORDERS Anxiety Depression     Neuromuscular disease    GI/Hepatic Neg liver ROS, PUD,GERD  ,,  Endo/Other  diabetesHypothyroidism    Renal/GU negative Renal ROS  negative genitourinary   Musculoskeletal   Abdominal   Peds  Hematology  (+) Blood dyscrasia, anemia   Anesthesia Other Findings   Reproductive/Obstetrics negative OB ROS                             Anesthesia Physical Anesthesia Plan  ASA: 3  Anesthesia Plan: General   Post-op Pain Management:    Induction:   PONV Risk Score and Plan: Propofol  infusion  Airway Management Planned:   Additional Equipment:   Intra-op Plan:   Post-operative Plan:   Informed Consent: I have reviewed the patients History and Physical, chart, labs and discussed the procedure including the risks, benefits and alternatives for the proposed anesthesia with the patient or authorized representative who has indicated his/her understanding and acceptance.     Dental Advisory Given  Plan Discussed with: CRNA  Anesthesia Plan Comments:        Anesthesia Quick Evaluation

## 2024-04-12 NOTE — Transfer of Care (Signed)
 Immediate Anesthesia Transfer of Care Note  Patient: [name]  Procedure(s) Performed: COLONOSCOPY EGD (ESOPHAGOGASTRODUODENOSCOPY)  Patient Location: Endoscopy Unit  Anesthesia Type:General  Level of Consciousness: awake, alert , and oriented  Airway & Oxygen  Therapy: Patient Spontanous Breathing and Patient connected to nasal cannula oxygen   Post-op Assessment: Report given to RN and Post -op Vital signs reviewed and stable  Post vital signs: Reviewed and stable  Last Vitals:  Vitals Value Taken Time  BP    Temp    Pulse    Resp    SpO2      Last Pain:  Vitals:   04/12/24 0918  TempSrc:   PainSc: 4       Patients Stated Pain Goal: 6 (04/12/24 0751)  Complications: No notable events documented.

## 2024-04-12 NOTE — Op Note (Signed)
 [facility] Patient Name: [name] Procedure Date: 04/12/2024 9:07 AM MRN: [id number] Date of Birth: June 19, 1941 Attending MD: Gemma Kelp , MD, [id number] CSN: [id number] Age: 83 Admit Type: Outpatient Procedure:                Upper GI endoscopy Indications:              anemia , Heme positive stool Providers:                Gemma Kelp, MD, Vonna Guardian, Graydon Lazier                            RN, RN, Annell Barrow Referring MD:              Medicines:                Propofol  per Anesthesia Complications:            No immediate complications. . Estimated Blood Loss:     Estimated blood loss: none. Procedure:                Pre-Anesthesia Assessment:                           - Prior to the procedure, a History and Physical                            was performed, and patient medications and                            allergies were reviewed. The patient's tolerance of                            previous anesthesia was also reviewed. The risks                            and benefits of the procedure and the sedation                            options and risks were discussed with the patient.                            All questions were answered, and informed consent                            was obtained. Prior Anticoagulants: The patient                            last took Eliquis  (apixaban ) 3 days prior to the                            procedure. ASA Grade Assessment: III - A patient                            with severe systemic disease. After reviewing the  risks and benefits, the patient was deemed in                            satisfactory condition to undergo the procedure.                           After obtaining informed consent, the endoscope was                            passed under direct vision. Throughout the                            procedure, the patient's blood pressure, pulse, and                             oxygen  saturations were monitored continuously. The                            [id number] ([id number]) scope was introduced through the                            mouth, and advanced to the second part of duodenum.                            The upper GI endoscopy was accomplished without                            difficulty. The patient tolerated the procedure                            well. Scope In: 9:26:50 AM Scope Out: 9:30:26 AM Total Procedure Duration: 0 hours 3 minutes 36 seconds  Findings:      Somewhat patulous EG junction. Couple of distal esophageal erosion       straddling the GE junction. No Barrett's epithelium.      A small hiatal hernia was present.      The duodenal bulb and second portion of the duodenum were normal. Impression:               - Mild erosive reflux esophagitis. Patulous EG                            junction.                           - Small hiatal hernia.                           - Normal duodenal bulb and second portion of the                            duodenum.                           - No specimens collected. Moderate Sedation:      Moderate (conscious) sedation was personally administered by  an       anesthesia professional. The following parameters were monitored: oxygen        saturation, heart rate, blood pressure, respiratory rate, EKG, adequacy       of pulmonary ventilation, and response to care. Recommendation:           - Patient has a contact number available for                            emergencies. The signs and symptoms of potential                            delayed complications were discussed with the                            patient. Return to normal activities tomorrow.                            Written discharge instructions were provided to the                            patient.                           - Advance diet as tolerated.                           - Continue present medications.                            - Return to my office in 6 months. OV in 6 months                            see colonoscopy report Procedure Code(s):        --- Professional ---                           [phone], Esophagogastroduodenoscopy, flexible,                            transoral; diagnostic, including collection of                            specimen(s) by brushing or washing, when performed                            (separate procedure) Diagnosis Code(s):        --- Professional ---                           K44.9, Diaphragmatic hernia without obstruction or                            gangrene                           D50.0, Iron deficiency anemia secondary to blood  loss (chronic)                           R19.5, Other fecal abnormalities CPT copyright 2022 American Medical Association. All rights reserved. The codes documented in this report are preliminary and upon coder review may  be revised to meet current compliance requirements. Windsor Hatcher. Rollin Kotowski, MD Gemma Kelp, MD 04/12/2024 9:53:26 AM This report has been signed electronically. Number of Addenda: 0

## 2024-04-13 ENCOUNTER — Telehealth: Payer: Self-pay | Admitting: Podiatry

## 2024-04-13 NOTE — Telephone Encounter (Signed)
 Pt already spoke w/ Kerney Pee.

## 2024-04-13 NOTE — Telephone Encounter (Signed)
 Pt wants to know if she can wash her orthotics in the washer machine or how do she clean them.

## 2024-04-14 ENCOUNTER — Encounter (HOSPITAL_COMMUNITY): Payer: Self-pay | Admitting: Internal Medicine

## 2024-04-16 NOTE — Anesthesia Postprocedure Evaluation (Signed)
 Anesthesia Post Note  Patient: [name]  Procedure(s) Performed: COLONOSCOPY EGD (ESOPHAGOGASTRODUODENOSCOPY)  Patient location during evaluation: Phase II Anesthesia Type: General Level of consciousness: awake Pain management: pain level controlled Vital Signs Assessment: post-procedure vital signs reviewed and stable Respiratory status: spontaneous breathing and respiratory function stable Cardiovascular status: blood pressure returned to baseline and stable Postop Assessment: no headache and no apparent nausea or vomiting Anesthetic complications: no Comments: Late entry   No notable events documented.   Last Vitals:  Vitals:   04/12/24 0803 04/12/24 0952  BP: (!) 158/85 104/77  Pulse: 78 82  Resp: 11 19  Temp: 36.7 C 36.4 C  SpO2: 97% 100%    Last Pain:  Vitals:   04/12/24 0952  TempSrc: Oral  PainSc: 0-No pain                 Coretha Dew

## 2024-04-27 ENCOUNTER — Ambulatory Visit: Admitting: Podiatry

## 2024-04-27 DIAGNOSIS — L84 Corns and callosities: Secondary | ICD-10-CM | POA: Diagnosis not present

## 2024-04-28 NOTE — Progress Notes (Signed)
 Subjective: Chief Complaint  Patient presents with   Callouses    Rm 13 Patient is here for pre-ulcerative callus of the right foot. Pt states no pain, but has questions regarding zinc sulfate medication for the right foot.    83 year old female presents the office with above concerns.  She presents today for evaluation of the callus on the right foot.  She has not noticed any open lesions or any swelling or redness or any drainage.  No open lesions otherwise.  No fevers or chills.  Objective: AAO x3, NAD DP/PT pulses palpable bilaterally, CRT less than 3 seconds Vascular right hallux is a hyperkeratotic tissue.  Upon debridement there is 1 small central area of dried blood present without any underlying ulceration, drainage or signs of infection.  No surrounding erythema, ascending cellulitis.  No signs of infection noted otherwise. No pain with calf compression, swelling, warmth, erythema  Assessment: Preulcerative callus  Plan:  - Sharply debrided x 1 without any complications or bleeding. - Continue moisturizer, offloading. - Currently no ulceration is to monitor closely. - The nails were just trimmed last appointment however today I did some of the nails to help with the rough edges.  This was completed without any complications or bleeding.  - Hold off on the Zinc for now as the wound has healed. This was originally prescribed by the wound care center.  Return in about 9 weeks (around 06/29/2024).  Charity Conch DPM   Charity Conch DPM

## 2024-04-30 DIAGNOSIS — I10 Essential (primary) hypertension: Secondary | ICD-10-CM | POA: Diagnosis not present

## 2024-04-30 DIAGNOSIS — E039 Hypothyroidism, unspecified: Secondary | ICD-10-CM | POA: Diagnosis not present

## 2024-04-30 DIAGNOSIS — I4892 Unspecified atrial flutter: Secondary | ICD-10-CM | POA: Diagnosis not present

## 2024-04-30 DIAGNOSIS — E119 Type 2 diabetes mellitus without complications: Secondary | ICD-10-CM | POA: Diagnosis not present

## 2024-05-01 LAB — TSH: TSH: 3.18 u[IU]/mL (ref 0.450–4.500)

## 2024-05-01 LAB — T4, FREE: Free T4: 1.1 ng/dL (ref 0.82–1.77)

## 2024-05-11 DIAGNOSIS — Z08 Encounter for follow-up examination after completed treatment for malignant neoplasm: Secondary | ICD-10-CM | POA: Diagnosis not present

## 2024-05-11 DIAGNOSIS — Z85828 Personal history of other malignant neoplasm of skin: Secondary | ICD-10-CM | POA: Diagnosis not present

## 2024-05-14 DIAGNOSIS — R519 Headache, unspecified: Secondary | ICD-10-CM | POA: Diagnosis not present

## 2024-05-14 DIAGNOSIS — G8929 Other chronic pain: Secondary | ICD-10-CM | POA: Diagnosis not present

## 2024-05-14 DIAGNOSIS — M7918 Myalgia, other site: Secondary | ICD-10-CM | POA: Diagnosis not present

## 2024-05-14 DIAGNOSIS — K2100 Gastro-esophageal reflux disease with esophagitis, without bleeding: Secondary | ICD-10-CM | POA: Diagnosis not present

## 2024-05-14 DIAGNOSIS — M545 Low back pain, unspecified: Secondary | ICD-10-CM | POA: Diagnosis not present

## 2024-05-14 DIAGNOSIS — L84 Corns and callosities: Secondary | ICD-10-CM | POA: Diagnosis not present

## 2024-05-14 DIAGNOSIS — G629 Polyneuropathy, unspecified: Secondary | ICD-10-CM | POA: Diagnosis not present

## 2024-05-14 DIAGNOSIS — I10 Essential (primary) hypertension: Secondary | ICD-10-CM | POA: Diagnosis not present

## 2024-06-07 NOTE — Patient Instructions (Signed)

## 2024-06-08 ENCOUNTER — Ambulatory Visit: Admitting: Nurse Practitioner

## 2024-06-08 ENCOUNTER — Encounter: Payer: Self-pay | Admitting: Nurse Practitioner

## 2024-06-08 VITALS — BP 110/70 | HR 68 | Ht 64.0 in | Wt 148.8 lb

## 2024-06-08 DIAGNOSIS — E039 Hypothyroidism, unspecified: Secondary | ICD-10-CM

## 2024-06-08 MED ORDER — LEVOTHYROXINE SODIUM 50 MCG PO TABS: 50.0000 ug | ORAL_TABLET | Freq: Every day | ORAL | 3 refills | Status: DC

## 2024-06-08 NOTE — Progress Notes (Signed)
 Endocrinology Follow Up Visit                                        06/08/2024, 10:44 AM     SUBJECTIVE:  [name] is a 83 y.o.-year-old female patient being seen in follow up after being seen in consultation for hypothyroidism referred by Shona Norleen PEDLAR, MD.   Past Medical History:  Diagnosis Date   Anemia    Anxiety    Arthritis    Asthma    Atrial flutter (HCC)    Constipation    Dysrhythmia    GERD (gastroesophageal reflux disease)    High blood pressure    History of COVID-19 12/28/2020   Hypothyroidism    PAD (peripheral artery disease) (HCC)    bilateral legs   Peripheral edema    Peripheral neuropathy    bilateral feet at night occ   Psoriasis    Seasonal allergies    Skin cancer    Left eye brow   Varicose veins    Vertigo 11/23/2013    Past Surgical History:  Procedure Laterality Date   APPENDECTOMY     patient unsure   BIOPSY  12/21/2020   Procedure: BIOPSY;  Surgeon: Shaaron Lamar HERO, MD;  [facility]: [facility];  Service: Endoscopy;;   BIOPSY  05/28/2021   Procedure: BIOPSY;  Surgeon: Cindie Carlin POUR, DO;  Location: [facility];  Service: Endoscopy;;   CARDIOVERSION N/A 04/04/2023   Procedure: CARDIOVERSION;  Surgeon: Alvan Dorn FALCON, MD;  Location: [facility];  Service: Endoscopy;  Laterality: N/A;   CATARACT EXTRACTION W/PHACO Left 01/27/2017   Procedure: CATARACT EXTRACTION PHACO AND INTRAOCULAR LENS PLACEMENT (IOC);  Surgeon: Cherene Mania, MD;  Location: [facility];  Service: Ophthalmology;  Laterality: Left;  CDE: 29.90   CATARACT EXTRACTION W/PHACO Right 02/24/2017   Procedure: CATARACT EXTRACTION PHACO AND INTRAOCULAR LENS PLACEMENT (IOC);  Surgeon: Cherene Mania, MD;  Location: [facility];  Service: Ophthalmology;  Laterality: Right;  CDE: 8.57   CHOLECYSTECTOMY     COLONOSCOPY N/A 10/01/2016   Procedure: COLONOSCOPY;  Surgeon: Lamar HERO Shaaron, MD; pancolonic diverticulosis, melanosis  coli, otherwise normal exam.  No recommendations to repeat colonoscopy.   COLONOSCOPY N/A 04/12/2024   Procedure: COLONOSCOPY;  Surgeon: Shaaron Lamar HERO, MD;  [facility]: [facility];  Service: Endoscopy;  Laterality: N/A;  8:45 am, asa 3   COLONOSCOPY WITH PROPOFOL  N/A 12/21/2020   Rourk: Diverticulosis   ESOPHAGOGASTRODUODENOSCOPY N/A 04/12/2024   Procedure: EGD (ESOPHAGOGASTRODUODENOSCOPY);  Surgeon: Shaaron Lamar HERO, MD;  [facility];  Service: Endoscopy;  Laterality: N/A;   ESOPHAGOGASTRODUODENOSCOPY (EGD) WITH PROPOFOL  N/A 12/21/2020   Rourk: Erosive reflux esophagitis, nonbleeding gastric ulcers with biopsy showing erosion/ulceration, negative for H. pylori.   ESOPHAGOGASTRODUODENOSCOPY (EGD) WITH PROPOFOL  N/A 05/28/2021   Carver: gastritis (reactive gastropathy), no H.pylori   FLEXOR TENDON REPAIR Right 11/06/2017   Procedure: RIGHT WRIST FLEXOR TENDON REPAIRAND STT DEBRIEDMENT;  Surgeon: Murrell Drivers, MD;  Location: [facility];  Service: Orthopedics;  Laterality: Right;   MOUTH  SURGERY     artery bleed   TOTAL HIP ARTHROPLASTY Right 01/31/2021   Procedure: TOTAL HIP ARTHROPLASTY ANTERIOR APPROACH;  Surgeon: Fidel Rogue, MD;  Location: [facility];  Service: Orthopedics;  Laterality: Right;   TUBAL LIGATION      Social History   Socioeconomic History   Marital status: Married    Spouse name: Not on file   Number of children: Not on file   Years of education: Not on file   Highest education level: Not on file  Occupational History   Not on file  Tobacco Use   Smoking status: Former    Current packs/day: 0.00    Average packs/day: 0.3 packs/day for 5.0 years (1.3 ttl pk-yrs)    Types: Cigarettes    Start date: 12/02/1992    Quit date: 12/02/1997    Years since quitting: 26.5    Passive exposure: Past   Smokeless tobacco: Never  Vaping Use   Vaping status: Never Used  Substance and Sexual Activity   Alcohol  use: Not Currently    Alcohol /week: 1.0  standard drink of alcohol     Types: 1 Glasses of wine per week   Drug use: No   Sexual activity: Not Currently    Birth control/protection: Surgical    Comment: tubal  Other Topics Concern   Not on file  Social History Narrative   Not on file   Social Drivers of Health   Financial Resource Strain: Low Risk  (04/10/2020)   Overall Financial Resource Strain (CARDIA)    Difficulty of Paying Living Expenses: Not hard at all  Food Insecurity: No Food Insecurity (05/31/2021)   Hunger Vital Sign    Worried About Running Out of Food in the Last Year: Never true    Ran Out of Food in the Last Year: Never true  Transportation Needs: No Transportation Needs (02/19/2022)   PRAPARE - Administrator, Civil Service (Medical): No    Lack of Transportation (Non-Medical): No  Physical Activity: Inactive (04/10/2020)   Exercise Vital Sign    Days of Exercise per Week: 0 days    Minutes of Exercise per Session: 0 min  Stress: Stress Concern Present (04/10/2020)   [employer] of Occupational Health - Occupational Stress Questionnaire    Feeling of Stress : To some extent  Social Connections: Unknown (05/16/2022)   [facility]   Social Network    Social Network: Not on file    Family History  Problem Relation Age of Onset   Hypertension Mother    Varicose Veins Father    Heart attack Father    Heart disease Father    Hypertension Brother    COPD Brother     Outpatient Encounter Medications as of 06/08/2024  Medication Sig   acetaminophen  (TYLENOL ) 500 MG tablet Take 500-1,000 mg by mouth as needed for mild pain or moderate pain.   albuterol  (VENTOLIN  HFA) 108 (90 Base) MCG/ACT inhaler Inhale 2 puffs into the lungs every 6 (six) hours as needed for wheezing or shortness of breath.   ALPRAZolam  (XANAX ) 0.5 MG tablet Take 0.5 mg by mouth in the morning, at noon, and at bedtime. (1000, 1500, AT BEDTIME)   apixaban  (ELIQUIS ) 5 MG TABS tablet TAKE (1) TABLET BY MOUTH  TWICE DAILY.   azelastine  (ASTELIN ) 0.1 % nasal spray Place 1 spray into both nostrils 2 (two) times daily.   Budeson-Glycopyrrol-Formoterol  (BREZTRI  AEROSPHERE) 160-9-4.8 MCG/ACT AERO Inhale 2 puffs into the lungs daily.  empagliflozin  (JARDIANCE ) 10 MG TABS tablet Take 1 tablet (10 mg total) by mouth daily.   famotidine  (PEPCID ) 20 MG tablet TAKE 1 TABLET BY MOUTH DAILY AFTER SUPPER   fluocinonide gel (LIDEX) 0.05 % Apply 1 Application topically 2 (two) times daily.   fluticasone  (FLONASE ) 50 MCG/ACT nasal spray USE 2 SPRAYS INTO BOTH NOSTRILS EVERY MORNING   furosemide  (LASIX ) 40 MG tablet TAKE ONE TABLET BY MOUTH EVERY DAY. MAY TAKE ADDITIONAL TABLET AS NEEDED IF SHORT OF BREATH CONTINUES   losartan  (COZAAR ) 50 MG tablet Take 50 mg by mouth 2 (two) times daily.   Multiple Vitamin (MULTIVITAMIN WITH MINERALS) TABS tablet Take 1 tablet by mouth daily in the afternoon.   Na Sulfate-K Sulfate-Mg Sulfate concentrate (SUPREP) 17.5-3.13-1.6 GM/177ML SOLN As directed   NONFORMULARY OR COMPOUNDED ITEM Apply 1 Application topically 3 (three) times daily as needed (nerve pain.). Baclofen 2%/Diclofenac Sodium 3%/Gabapentin  5%/Lidocaine  HCl 5%/Menthol  1% Topical Gel   pantoprazole  (PROTONIX ) 40 MG tablet TAKE 1 TABLET BY MOUTH ONCE A DAY.   polyethylene glycol (MIRALAX  / GLYCOLAX ) 17 g packet Take 17 g by mouth every evening.   potassium chloride  (KLOR-CON ) 10 MEQ tablet Take 2 tablets (20 mEq total) by mouth daily.   raloxifene  (EVISTA ) 60 MG tablet Take 1 tablet (60 mg total) by mouth every morning.   SODIUM FLUORIDE 5000 PPM 1.1 % PSTE Take by mouth at bedtime.   venlafaxine  XR (EFFEXOR -XR) 75 MG 24 hr capsule Take 75 mg by mouth daily after breakfast.   vitamin D3 (CHOLECALCIFEROL) 25 MCG tablet Take 1,000 Units by mouth daily.   [DISCONTINUED] levothyroxine  (SYNTHROID ) 50 MCG tablet Take 1 tablet (50 mcg total) by mouth daily before breakfast.   levothyroxine  (SYNTHROID ) 50 MCG tablet Take 1 tablet  (50 mcg total) by mouth daily before breakfast.   No facility-administered encounter medications on file as of 06/08/2024.    ALLERGIES: Allergies  Allergen Reactions   Amoxicillin-Pot Clavulanate Nausea And Vomiting, Other (See Comments), Itching and Nausea Only    Has patient had a PCN reaction causing immediate rash, facial/tongue/throat swelling, SOB or lightheadedness with hypotension: No Has patient had a PCN reaction causing severe rash involving mucus membranes or skin necrosis: No Has patient had a PCN reaction that required hospitalization: No Has patient had a PCN reaction occurring within the last 10 years: No If all of the above answers are NO, then may proceed with Cephalosporin use. Has patient had a PCN reaction causing immediate rash, facial/tongue/throat swelling, SOB or lightheadedness with hypotension: No Has patient had a PCN reaction causing severe rash involving mucus membranes or skin necrosis: No Has patient had a PCN reaction that required hospitalization: No Has patient had a PCN reaction occurring within the last 10 years: No If all of the above answers are NO, then may proceed with Cephalosporin use.    Ace Inhibitors Cough   Guaifenesin Hives and Other (See Comments)   Lisinopril Cough and Other (See Comments)   Cetirizine  Other (See Comments)    Unknown reaction   Clarithromycin      Made her feel crazy.    Entex La Itching   Gabapentin  Other (See Comments)    Foggy head   Levofloxacin Hives and Itching   Meloxicam  Other (See Comments)    (MOBIC ) Unknown reaction type (maybe nausea?)   VACCINATION STATUS: Immunization History  Administered Date(s) Administered   Influenza Whole 09/01/2016   Influenza, High Dose Seasonal PF 09/11/2018, 11/02/2019, 09/20/2021, 08/07/2022   Influenza-Unspecified 08/02/2013, 08/29/2015  PFIZER(Purple Top)SARS-COV-2 Vaccination 12/16/2019, 01/17/2020, 09/28/2020   Pneumococcal Conjugate-13 08/31/2019   Tdap  10/13/2022   Zoster Recombinant(Shingrix) 08/31/2019     HPI  Thyroid  Problem Presents for follow-up visit. Symptoms include anxiety. Patient reports no cold intolerance, constipation, depressed mood, fatigue, heat intolerance, palpitations, tremors, weight gain or weight loss. The symptoms have been stable.    [name]  is a patient with the above medical history.  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    I reviewed patient's thyroid  tests:  Lab Results  Component Value Date   TSH 3.180 04/30/2024   TSH 2.510 01/22/2024   TSH 3.780 12/23/2023   TSH 3.359 10/22/2023   TSH 5.800 (H) 09/10/2023   TSH 2.300 03/18/2023   TSH 3.140 09/16/2022   TSH 1.740 03/18/2022   TSH 2.670 09/19/2021   TSH 2.040 05/14/2021   FREET4 1.10 04/30/2024   FREET4 1.12 01/22/2024   FREET4 1.19 12/23/2023   FREET4 0.89 10/22/2023   FREET4 1.29 09/10/2023   FREET4 1.09 03/18/2023   FREET4 1.23 09/16/2022   FREET4 1.38 03/18/2022   FREET4 1.09 09/19/2021   FREET4 1.36 05/14/2021     Pt denies feeling nodules in neck, hoarseness, dysphagia/odynophagia, SOB with lying down.  she has No history of radiation therapy to head or neck.  No recent use of iodine  supplements.  I reviewed her chart and she also has a history of GERD with ulcers, depression/anxiety, anemia, asthma, and high blood pressure.   Review of systems  Constitutional: + stable body weight,  current Body mass index is 25.54 kg/m. , + fatigue-stable, no subjective hyperthermia, no subjective hypothermia Eyes: no blurry vision, no xerophthalmia ENT: no sore throat, no nodules palpated in throat, no dysphagia/odynophagia, no hoarseness Cardiovascular: no chest pain, no shortness of breath, no palpitations, no leg swelling Respiratory: no cough, no shortness of breath Gastrointestinal: no nausea/vomiting/diarrhea Musculoskeletal: no muscle/joint aches Skin: no rashes, no  hyperemia Neurological: no tremors, no numbness, no tingling, no dizziness Psychiatric: no depression, no anxiety, has significant amount of stress caring for her husband with Alzheimers  ------------------------------------------------------------------------------------------------------------------ OBJECTIVE:  BP 110/70 (BP Location: Right Arm, Patient Position: Sitting, Cuff Size: Large)   Pulse 68   Ht 5' 4 (1.626 m)   Wt 148 lb 12.8 oz (67.5 kg)   BMI 25.54 kg/m  Wt Readings from Last 3 Encounters:  06/08/24 148 lb 12.8 oz (67.5 kg)  04/08/24 151 lb 14.4 oz (68.9 kg)  02/23/24 152 lb (68.9 kg)   BP Readings from Last 3 Encounters:  06/08/24 110/70  04/12/24 104/77  04/08/24 (!) 132/92     Physical Exam- Limited  Constitutional:  Body mass index is 25.54 kg/m. , not in acute distress, normal state of mind Eyes:  EOMI, no exophthalmos Musculoskeletal: no gross deformities, strength intact in all four extremities, no gross restriction of joint movements Skin:  no rashes, no hyperemia Neurological: no tremor with outstretched hands   CMP ( most recent) CMP     Component Value Date/Time   NA 139 03/03/2024 1217   K 4.5 03/03/2024 1217   CL 100 03/03/2024 1217   CO2 23 03/03/2024 1217   GLUCOSE 90 03/03/2024 1217   GLUCOSE 93 11/11/2023 1142   BUN 16 03/03/2024 1217   CREATININE 1.00 03/03/2024 1217   CREATININE 0.94 (H) 01/22/2021 1419   CALCIUM 9.2 03/03/2024 1217   PROT 7.2 02/20/2023 1337   PROT 6.5 09/24/2018 1204  ALBUMIN 4.1 02/20/2023 1337   ALBUMIN 4.3 09/24/2018 1204   AST 24 02/20/2023 1337   ALT 18 02/20/2023 1337   ALKPHOS 74 02/20/2023 1337   BILITOT 0.8 02/20/2023 1337   BILITOT 0.4 09/24/2018 1204   GFRNONAA 53 (L) 11/11/2023 1142   GFRAA >60 05/08/2020 1232     Diabetic Labs (most recent): No results found for: HGBA1C, MICROALBUR   Lipid Panel ( most recent) Lipid Panel     Component Value Date/Time   TRIG 92 09/11/2020 1547        Lab Results  Component Value Date   TSH 3.180 04/30/2024   TSH 2.510 01/22/2024   TSH 3.780 12/23/2023   TSH 3.359 10/22/2023   TSH 5.800 (H) 09/10/2023   TSH 2.300 03/18/2023   TSH 3.140 09/16/2022   TSH 1.740 03/18/2022   TSH 2.670 09/19/2021   TSH 2.040 05/14/2021   FREET4 1.10 04/30/2024   FREET4 1.12 01/22/2024   FREET4 1.19 12/23/2023   FREET4 0.89 10/22/2023   FREET4 1.29 09/10/2023   FREET4 1.09 03/18/2023   FREET4 1.23 09/16/2022   FREET4 1.38 03/18/2022   FREET4 1.09 09/19/2021   FREET4 1.36 05/14/2021      Latest Reference Range & Units 09/10/23 12:11 10/22/23 12:16 12/23/23 12:15 01/22/24 16:27 04/30/24 12:19  TSH 0.450 - 4.500 uIU/mL 5.800 (H) 3.359 3.780 2.510 3.180  T4,Free(Direct) 0.82 - 1.77 ng/dL 8.70 9.10 8.80 8.87 8.89  (H): Data is abnormally high  --------------------------------------------------------------------------------------------------------------------   ASSESSMENT / PLAN: 1. Hypothyroidism- acquired   Patient with long-standing hypothyroidism, on levothyroxine  therapy. On physical exam, patient does not have gross goiter, thyroid  nodules, or neck compression symptoms.  Thyroid  US  WNL for age.  -Her previsit thyroid  function tests are consistent with appropriate hormone replacement.  She is advised to continue Levothyroxine  50 mcg po daily before breakfast.   - We discussed about correct intake of levothyroxine , at fasting, with water , separated by at least 30 minutes from breakfast. -Patient is made aware of the fact that thyroid  hormone replacement is needed for life, dose to be adjusted by periodic monitoring of thyroid  function tests.  -She notes she feels like she is catching a cold or something as she just feels horrible but she could not give specific symptoms.  She says she may call her PCP for evaluation soon.    I spent  15  minutes in the care of the patient today including review of labs from Thyroid  Function, CMP,  and other relevant labs ; imaging/biopsy records (current and previous including abstractions from other facilities); face-to-face time discussing  her lab results and symptoms, medications doses, her options of short and long term treatment based on the latest standards of care / guidelines;   and documenting the encounter.  Alfornia SHAUNNA Drones  participated in the discussions, expressed understanding, and voiced agreement with the above plans.  All questions were answered to her satisfaction. she is encouraged to contact clinic should she have any questions or concerns prior to her return visit.   Follow Up Plan: Return in about 6 months (around 12/09/2024) for Thyroid  follow up, Previsit labs.   Benton Rio, [facility] [address] [place], [place] [address] Phone: [phone] Fax: [phone]  06/08/2024, 10:44 AM

## 2024-06-09 ENCOUNTER — Other Ambulatory Visit: Payer: Self-pay | Admitting: Gastroenterology

## 2024-06-21 ENCOUNTER — Other Ambulatory Visit: Payer: Self-pay | Admitting: Internal Medicine

## 2024-06-29 ENCOUNTER — Ambulatory Visit: Admitting: Podiatry

## 2024-07-09 ENCOUNTER — Ambulatory Visit: Payer: Medicare HMO | Admitting: Allergy & Immunology

## 2024-07-09 ENCOUNTER — Other Ambulatory Visit: Payer: Self-pay

## 2024-07-09 ENCOUNTER — Encounter: Payer: Self-pay | Admitting: Allergy & Immunology

## 2024-07-09 VITALS — BP 116/70 | HR 72 | Resp 16

## 2024-07-09 DIAGNOSIS — J4540 Moderate persistent asthma, uncomplicated: Secondary | ICD-10-CM

## 2024-07-09 DIAGNOSIS — J45998 Other asthma: Secondary | ICD-10-CM | POA: Diagnosis not present

## 2024-07-09 DIAGNOSIS — J3089 Other allergic rhinitis: Secondary | ICD-10-CM | POA: Diagnosis not present

## 2024-07-09 DIAGNOSIS — J302 Other seasonal allergic rhinitis: Secondary | ICD-10-CM | POA: Diagnosis not present

## 2024-07-09 DIAGNOSIS — B999 Unspecified infectious disease: Secondary | ICD-10-CM | POA: Diagnosis not present

## 2024-07-09 DIAGNOSIS — J45909 Unspecified asthma, uncomplicated: Secondary | ICD-10-CM | POA: Diagnosis not present

## 2024-07-09 NOTE — Patient Instructions (Addendum)
 1. Mild persistent asthma without complication - We are going to change you to Breztri  but increase to two puff TWICE DAILY since you are getting this for free.  - Spacer use reviewed. - Sample provided today  - Daily controller medication(s): Breztri  two puffs TWICE DAILY EVERY DAY - Prior to physical activity: albuterol  2 puffs 10-15 minutes before physical activity. - Rescue medications: albuterol  4 puffs every 4-6 hours as needed - Asthma control goals:  * Full participation in all desired activities (may need albuterol  before activity) * Albuterol  use two time or less a week on average (not counting use with activity) * Cough interfering with sleep two time or less a month * Oral steroids no more than once a year * No hospitalizations  2. Seasonal and perennial allergic rhinitis - well controlled on the nose sprays only - Previous testing showed: grasses, indoor molds, outdoor molds, dust mites, and cockroach - Continue with:   Flonase  (fluticasone ) one spray per nostril one to two times daily Astelin  (azelastine ) 2 sprays per nostril one to two times daily   - IF NEEDED FOR RUNNY NOSE: Atrovent  (ipratropium) 2 sprays in each nostril EVERY 8 hours as needed - Consider allergy  shots as a means of long-term control. - Allergy  shots re-train and reset the immune system to ignore environmental allergens and decrease the resulting immune response to those allergens (sneezing, itchy watery eyes, runny nose, nasal congestion, etc).    - Allergy  shots improve symptoms in 75-85% of patients.   3. Recurrent infections  - Immune work up was completely normal.  - We do not need to do any further workup at this time.   4. Return in about 6 months (around 01/09/2025). You can have the follow up appointment with Dr. Iva or a Nurse Practicioner (our Nurse Practitioners are excellent and always have Physician oversight!).    Please inform us  of any Emergency Department visits,  hospitalizations, or changes in symptoms. Call us  before going to the ED for breathing or allergy  symptoms since we might be able to fit you in for a sick visit. Feel free to contact us  anytime with any questions, problems, or concerns.  It was a pleasure to see you again today!  Websites that have reliable patient information: 1. American Academy of Asthma, Allergy , and Immunology: www.aaaai.org 2. Food Allergy  Research and Education (FARE): foodallergy.org 3. Mothers of Asthmatics: http://www.asthmacommunitynetwork.org 4. [employer] of Allergy , Asthma, and Immunology: www.acaai.org      "Like" us  on Facebook and Instagram for our latest updates!      A healthy democracy works best when [employer] participate! Make sure you are registered to vote! If you have moved or changed any of your contact information, you will need to get this updated before voting! Scan the QR codes below to learn more!        Three finger technique: Using a spacer is the BEST for medication delivery. But if you do not have a spacer, you can use 3 fingers to make space between the end of the inhaler where the medication comes out and your mouth. Open you mouth and then start inhaling. Press the inhaler and keep breathing in for a few more seconds. Then hold your breath for 5 seconds. Repeat for the prescribed number of inhalations.   Allergy  Shots  Allergies are the result of a chain reaction that starts in the immune system. Your immune system controls how your body defends itself. For instance, if you have  an allergy  to pollen, your immune system identifies pollen as an invader or allergen. Your immune system overreacts by producing antibodies called Immunoglobulin E (IgE). These antibodies travel to cells that release chemicals, causing an allergic reaction.  The concept behind allergy  immunotherapy, whether it is received in the form of shots or tablets, is that the immune system can be desensitized  to specific allergens that trigger allergy  symptoms. Although it requires time and patience, the payback can be long-term relief. Allergy  injections contain a dilute solution of those substances that you are allergic to based upon your skin testing and allergy  history.   How Do Allergy  Shots Work?  Allergy  shots work much like a vaccine. Your body responds to injected amounts of a particular allergen given in increasing doses, eventually developing a resistance and tolerance to it. Allergy  shots can lead to decreased, minimal or no allergy  symptoms.  There generally are two phases: build-up and maintenance. Build-up often ranges from three to six months and involves receiving injections with increasing amounts of the allergens. The shots are typically given once or twice a week, though more rapid build-up schedules are sometimes used.  The maintenance phase begins when the most effective dose is reached. This dose is different for each person, depending on how allergic you are and your response to the build-up injections. Once the maintenance dose is reached, there are longer periods between injections, typically two to four weeks.  Occasionally doctors give cortisone-type shots that can temporarily reduce allergy  symptoms. These types of shots are different and should not be confused with allergy  immunotherapy shots.  Who Can Be Treated with Allergy  Shots?  Allergy  shots may be a good treatment approach for people with allergic rhinitis (hay fever), allergic asthma, conjunctivitis (eye allergy ) or stinging insect allergy .   Before deciding to begin allergy  shots, you should consider:   The length of allergy  season and the severity of your symptoms  Whether medications and/or changes to your environment can control your symptoms  Your desire to avoid long-term medication use  Time: allergy  immunotherapy requires a major time commitment  Cost: may vary depending on your insurance  coverage  Allergy  shots for children age 35 and older are effective and often well tolerated. They might prevent the onset of new allergen sensitivities or the progression to asthma.  Allergy  shots are not started on patients who are pregnant but can be continued on patients who become pregnant while receiving them. In some patients with other medical conditions or who take certain common medications, allergy  shots may be of risk. It is important to mention other medications you talk to your allergist.   What are the two types of build-ups offered:   RUSH or Rapid Desensitization -- one day of injections lasting from 8:30-4:30pm, injections every 1 hour.  Approximately half of the build-up process is completed in that one day.  The following week, normal build-up is resumed, and this entails ~16 visits either weekly or twice weekly, until reaching your "maintenance dose" which is continued weekly until eventually getting spaced out to every month for a duration of 3 to 5 years. The regular build-up appointments are nurse visits where the injections are administered, followed by required monitoring for 30 minutes.    Traditional build-up -- weekly visits for 6 -12 months until reaching "maintenance dose", then continue weekly until eventually spacing out to every 4 weeks as above. At these appointments, the injections are administered, followed by required monitoring for 30 minutes.  Either way is acceptable, and both are equally effective. With the rush protocol, the advantage is that less time is spent here for injections overall AND you would also reach maintenance dosing faster (which is when the clinical benefit starts to become more apparent). Not everyone is a candidate for rapid desensitization.   IF we proceed with the RUSH protocol, there are premedications which must be taken the day before and the day after the rush only (this includes antihistamines, steroids, and Singulair ).  After  the rush day, no prednisone  or Singulair  is required, and we just recommend antihistamines taken on your injection day.  What Is An Estimate of the Costs?  If you are interested in starting allergy  injections, please check with your insurance company about your coverage for both allergy  vial sets and allergy  injections.  Please do so prior to making the appointment to start injections.  The following are CPT codes to give to your insurance company. These are the amounts we BILL to the insurance company, but the amount YOU WILL PAY and WE RECEIVE IS SUBSTANTIALLY LESS and depends on the contracts we have with different insurance companies.   Amount Billed to Insurance Two allergy  vial set  CPT 95165   $ 2400     Two injections   CPT 95117   $ 40  Regarding the allergy  injections, your co-pay may or may not apply with each injection, so please confirm this with your insurance company. When you start allergy  injections, 1 or 2 sets of vials are made based on your allergies.  Not all patients can be on one set of vials. A set of vials lasts 6 months to a year depending on how quickly you can proceed with your build-up of your allergy  injections. Vials are personalized for each patient depending on their specific allergens.  How often are allergy  injection given during the build-up period?   Injections are given at least weekly during the build-up period until your maintenance dose is achieved. Per the doctor's discretion, you may have the option of getting allergy  injections two times per week during the build-up period. However, there must be at least 48 hours between injections. The build-up period is usually completed within 6-12 months depending on your ability to schedule injections and for adjustments for reactions. When maintenance dose is reached, your injection schedule is gradually changed to every two weeks and later to every three weeks. Injections will then continue every 4 weeks. Usually,  injections are continued for a total of 3-5 years.   When Will I Feel Better?  Some may experience decreased allergy  symptoms during the build-up phase. For others, it may take as long as 12 months on the maintenance dose. If there is no improvement after a year of maintenance, your allergist will discuss other treatment options with you.  If you aren't responding to allergy  shots, it may be because there is not enough dose of the allergen in your vaccine or there are missing allergens that were not identified during your allergy  testing. Other reasons could be that there are high levels of the allergen in your environment or major exposure to non-allergic triggers like tobacco smoke.  What Is the Length of Treatment?  Once the maintenance dose is reached, allergy  shots are generally continued for three to five years. The decision to stop should be discussed with your allergist at that time. Some people may experience a permanent reduction of allergy  symptoms. Others may relapse and a longer course of  allergy  shots can be considered.  What Are the Possible Reactions?  The two types of adverse reactions that can occur with allergy  shots are local and systemic. Common local reactions include very mild redness and swelling at the injection site, which can happen immediately or several hours after. Report a delayed reaction from your last injection. These include arm swelling or runny nose, watery eyes or cough that occurs within 12-24 hours after injection. A systemic reaction, which is less common, affects the entire body or a particular body system. They are usually mild and typically respond quickly to medications. Signs include increased allergy  symptoms such as sneezing, a stuffy nose or hives.   Rarely, a serious systemic reaction called anaphylaxis can develop. Symptoms include swelling in the throat, wheezing, a feeling of tightness in the chest, nausea or dizziness. Most serious systemic  reactions develop within 30 minutes of allergy  shots. This is why it is strongly recommended you wait in your doctor's office for 30 minutes after your injections. Your allergist is trained to watch for reactions, and his or her staff is trained and equipped with the proper medications to identify and treat them.   Report to the nurse immediately if you experience any of the following symptoms: swelling, itching or redness of the skin, hives, watery eyes/nose, breathing difficulty, excessive sneezing, coughing, stomach pain, diarrhea, or light headedness. These symptoms may occur within 15-20 minutes after injection and may require medication.   Who Should Administer Allergy  Shots?  The preferred location for receiving shots is your prescribing allergist's office. Injections can sometimes be given at another facility where the physician and staff are trained to recognize and treat reactions, and have received instructions by your prescribing allergist.  What if I am late for an injection?   Injection dose will be adjusted depending upon how many days or weeks you are late for your injection.   What if I am sick?   Please report any illness to the nurse before receiving injections. She may adjust your dose or postpone injections depending on your symptoms. If you have fever, flu, sinus infection or chest congestion it is best to postpone allergy  injections until you are better. Never get an allergy  injection if your asthma is causing you problems. If your symptoms persist, seek out medical care to get your health problem under control.  What If I am or Become Pregnant:  Women that become pregnant should schedule an appointment with [facility] before receiving any further allergy  injections.

## 2024-07-09 NOTE — Progress Notes (Addendum)
 FOLLOW UP  Date of Service/Encounter:  07/09/24   Assessment:   Mild persistent asthma without complication   Seasonal and perennial allergic rhinitis (grasses, indoor molds, outdoor molds, dust mites, and cockroach) - would likely benefit from allergen immunotherapy, although her duties at home taking care of her husband likely keep that from being an effective or easy treatment at this point in time   Remote history of smoking    Social stressors at home - husband with Alzheimers    Recent diagnosis of atrial fibrillation  Plan/Recommendations:   1. Mild persistent asthma without complication - We are going to change you to Breztri  but increase to two puff TWICE DAILY since you are getting this for free.  - Spacer use reviewed. - Sample provided today  - Daily controller medication(s): Breztri  two puffs TWICE DAILY EVERY DAY - Prior to physical activity: albuterol  2 puffs 10-15 minutes before physical activity. - Rescue medications: albuterol  4 puffs every 4-6 hours as needed - Asthma control goals:  * Full participation in all desired activities (may need albuterol  before activity) * Albuterol  use two time or less a week on average (not counting use with activity) * Cough interfering with sleep two time or less a month * Oral steroids no more than once a year * No hospitalizations  2. Seasonal and perennial allergic rhinitis - well controlled on the nose sprays only - Previous testing showed: grasses, indoor molds, outdoor molds, dust mites, and cockroach - Continue with:   Flonase  (fluticasone ) one spray per nostril one to two times daily Astelin  (azelastine ) 2 sprays per nostril one to two times daily   - IF NEEDED FOR RUNNY NOSE: Atrovent  (ipratropium) 2 sprays in each nostril EVERY 8 hours as needed - Consider allergy  shots as a means of long-term control. - Allergy  shots re-train and reset the immune system to ignore environmental allergens and decrease the  resulting immune response to those allergens (sneezing, itchy watery eyes, runny nose, nasal congestion, etc).    - Allergy  shots improve symptoms in 75-85% of patients.   3. Recurrent infections  - Immune work up was completely normal.  - We do not need to do any further workup at this time.   4. Return in about 6 months (around 01/09/2025). You can have the follow up appointment with Dr. Iva or a Nurse Practicioner (our Nurse Practitioners are excellent and always have Physician oversight!).     Subjective:   [name] is a 83 y.o. 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) 10/29/2023   ERRONEOUS ENCOUNTER--DISREGARD 10/16/2023   Chronic diastolic heart failure (HCC) 08/15/2023   Chest pain of uncertain etiology 05/19/2023   Aortic regurgitation 05/19/2023   Paroxysmal atrial flutter (HCC) 02/20/2023   Seasonal and perennial allergic rhinitis 10/16/2022   Allergic rhinitis 09/10/2022   External hemorrhoids 08/22/2022   Neuropathy 07/30/2022   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/2022   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   Osteoarthritis of right knee 02/21/2022   Increased frequency of urination 02/20/2022   Intermittent confusion 02/20/2022   Urinary incontinence 02/20/2022   Posterior rhinorrhea 01/04/2022   Fatigue  12/24/2021   Impairment of balance 09/20/2021   Chronic insomnia 06/20/2021   Chronic low back pain 06/20/2021   Edema of lower extremity 06/20/2021   Gastroesophageal reflux disease without esophagitis 06/20/2021   Hypothyroidism 06/20/2021   Major depressive disorder 06/20/2021   Osteopenia 06/20/2021   Mixed hyperlipidemia 06/15/2021   Type  2 diabetes mellitus (HCC) 06/15/2021   Gastritis and gastroduodenitis 06/14/2021   Mixed anxiety and depressive disorder 06/01/2021   Gastric ulcer 04/13/2021   Abdominal pain 03/15/2021   Fracture of femoral neck, right (HCC) 01/31/2021   Closed displaced fracture of right femoral neck (HCC) 01/31/2021   Indigestion 11/02/2020   Change in stool caliber 11/02/2020   Loss of weight 08/23/2020   Nausea without vomiting 06/15/2020   Tick bite 04/10/2020   Medication refill 04/10/2020   Acquired trigger finger 03/28/2020   Pain in right knee 10/08/2019   Cellulitis, leg 08/10/2019   Cellulitis 08/08/2019   Fever 08/08/2019   Cellulitis of foot 08/08/2019   Hypokalemia 08/08/2019   Hyponatremia 08/08/2019   Essential hypertension 08/08/2019   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   Adverse food reaction 09/24/2018   Mild persistent asthma without complication 09/24/2018   Pain of left hip joint 07/17/2018   DOE (dyspnea on exertion) 01/24/2017   Cough variant asthma  vs UACS 01/24/2017   High risk medication use 09/25/2016   Varicose veins of bilateral lower extremities with other complications 03/07/2014   Varicose veins 02/15/2014   Vertigo 11/23/2013   Pain in limb 10/04/2013   Hip bursitis 02/11/2013   Constipation 02/16/2009   RECTAL BLEEDING 02/16/2009   History of cardiovascular disorder 01/29/2008    History obtained from: chart review and patient.  Discussed the use of AI scribe software for clinical note transcription with the patient and/or guardian, who gave verbal consent to proceed.  [name] is a 83 y.o. female presenting for a follow up visit.  She was last seen in February 2025.  At that time, lung testing looked amazing.  We changed her to Breztri  2 puffs once daily.  We continue with albuterol  as needed.  For her allergic rhinitis, we continue with Flonase  and Astelin .  We talked about allergy  shots for long-term control.  Her immune workup was completely  normal.  Since her last visit, she has done relatively well.   Asthma/Respiratory Symptom History: She experiences shortness of breath during activities such as mopping, cleaning, or walking longer distances, like half a football field. These activities leave her winded and necessitate the use of her rescue inhaler. She spends a lot of time sitting, particularly while caring for her bedridden husband, which may contribute to her deconditioning. She is currently using Breztri  once a day. She previously used Symbicort  but has since replaced it with Breztri . She does not use a spacer with her inhaler, which may affect the delivery of the medication to her lungs.  Allergic Rhinitis Symptom History: She experiences allergies, with symptoms including a runny nose and postnasal drip, which have improved but persist. She uses nasal drops in the morning and at bedtime, which seem to help reduce the frequency of needing to blow her nose. She suspects mold in her home may contribute to her symptoms, particularly in the bathroom area.  She describes feeling groggy and unwell upon waking in the morning, especially after a second sleep cycle in her recliner. She typically goes to bed around 10:30 PM after reading and wakes up early to be in the same room as  her husband, who requires care.   Otherwise, there have been no changes to her past medical history, surgical history, family history, or social history.    Review of systems otherwise negative other than that mentioned in the HPI.    Objective:   Blood pressure 116/70, pulse 72, resp. rate 16, SpO2 98%. There is no height or weight on file to calculate BMI.    Physical Exam Vitals reviewed.  Constitutional:      Appearance: Normal appearance. She is well-developed. She is not ill-appearing or toxic-appearing.     Comments: Lovely and talkative.   HENT:     Head: Normocephalic and atraumatic.     Right Ear: Tympanic membrane, ear canal and  external ear normal.     Left Ear: Tympanic membrane, ear canal and external ear normal.     Nose: No nasal deformity, septal deviation, mucosal edema, congestion or rhinorrhea.     Right Turbinates: Enlarged, swollen and pale.     Left Turbinates: Enlarged, swollen and pale.     Right Sinus: No maxillary sinus tenderness or frontal sinus tenderness.     Left Sinus: No maxillary sinus tenderness or frontal sinus tenderness.     Comments: No nasal polyps noted.     Mouth/Throat:     Lips: Pink.     Mouth: Mucous membranes are moist. Mucous membranes are not pale and not dry.     Pharynx: Uvula midline.  Eyes:     General: Lids are normal. No allergic shiner.       Right eye: No discharge.        Left eye: No discharge.     Conjunctiva/sclera: Conjunctivae normal.     Right eye: Right conjunctiva is not injected. No chemosis.    Left eye: Left conjunctiva is not injected. No chemosis.    Pupils: Pupils are equal, round, and reactive to light.  Cardiovascular:     Rate and Rhythm: Normal rate and regular rhythm.     Heart sounds: Normal heart sounds.  Pulmonary:     Effort: Pulmonary effort is normal. No tachypnea, accessory muscle usage or respiratory distress.     Breath sounds: Normal breath sounds. No wheezing, rhonchi or rales.     Comments: Moving air well in all lung fields. No increased work of breathing noted.  Chest:     Chest wall: No tenderness.  Lymphadenopathy:     Cervical: No cervical adenopathy.  Skin:    General: Skin is warm.     Capillary Refill: Capillary refill takes less than 2 seconds.     Coloration: Skin is not pale.     Findings: No abrasion, erythema, petechiae or rash. Rash is not papular, urticarial or vesicular.  Neurological:     Mental Status: She is alert.  Psychiatric:        Behavior: Behavior is cooperative.      Diagnostic studies: none       Marty Shaggy, MD  [facility] 

## 2024-07-14 NOTE — Addendum Note (Signed)
 Addended by: IVA MARTY SALTNESS on: 07/14/2024 11:19 AM   Modules accepted: Level of Service

## 2024-07-16 ENCOUNTER — Encounter: Payer: Self-pay | Admitting: Podiatry

## 2024-07-16 ENCOUNTER — Ambulatory Visit: Admitting: Podiatry

## 2024-07-16 DIAGNOSIS — Z7901 Long term (current) use of anticoagulants: Secondary | ICD-10-CM | POA: Diagnosis not present

## 2024-07-16 DIAGNOSIS — M79674 Pain in right toe(s): Secondary | ICD-10-CM

## 2024-07-16 DIAGNOSIS — M79675 Pain in left toe(s): Secondary | ICD-10-CM

## 2024-07-16 DIAGNOSIS — B351 Tinea unguium: Secondary | ICD-10-CM | POA: Diagnosis not present

## 2024-07-16 DIAGNOSIS — L84 Corns and callosities: Secondary | ICD-10-CM

## 2024-07-17 NOTE — Progress Notes (Signed)
 Subjective: Chief Complaint  Patient presents with   Callouses    Preulcerative callus right foot great toe.5 pain with pressure. Non diabetic.     83 year old female presents the office with above concerns.  She said the callus on the right big toe started buildup causing some discomfort.  She has not seen any swelling or redness or any drainage.  No open lesions.  She also needs her nails trimmed today as they are thickened elongated she has difficulty trimming them.  No other concerns.  She is on Eliquis   Objective: AAO x3, NAD DP/PT pulses palpable bilaterally, CRT less than 3 seconds Hyperkeratotic preulcerative lesion noted on the right hallux.  Upon debridement there is no skin breakdown but there is 1 small area of dried blood present.  There is no surrounding erythema, ascending cellulitis.  No fluctuation or crepitation.   Nails are hypertrophic, dystrophic, brittle, discolored, elongated 10. No surrounding redness or drainage. Tenderness nails 1-5 bilaterally. No pain with calf compression, swelling, warmth, erythema  Assessment: Preulcerative callus; symptomatic onychomycosis  Plan: Preulcerative lesion - Sharply debrided x 1 without any complications or bleeding. - Continue moisturizer, offloading. - We did briefly discuss surgery for the tail as well but she is not interested in this.  She is to monitor closely and continue offloading.  Symptomatic onychomycosis - Sharply debrided nails x 10 without any complications or bleeding.  Return in about 2 months (around 09/15/2024) for nail/callus trim.  Donnice JONELLE Fees DPM

## 2024-07-19 MED ORDER — LEVOCETIRIZINE DIHYDROCHLORIDE 5 MG PO TABS: 5.0000 mg | ORAL_TABLET | Freq: Every day | ORAL | 1 refills | Status: AC | PRN

## 2024-07-19 NOTE — Addendum Note (Signed)
 Addended by: FRANCIS ROULEAU A on: 07/19/2024 03:00 PM   Modules accepted: Orders

## 2024-08-23 ENCOUNTER — Telehealth: Payer: Self-pay

## 2024-08-23 DIAGNOSIS — X32XXXD Exposure to sunlight, subsequent encounter: Secondary | ICD-10-CM | POA: Diagnosis not present

## 2024-08-23 DIAGNOSIS — L570 Actinic keratosis: Secondary | ICD-10-CM | POA: Diagnosis not present

## 2024-08-23 DIAGNOSIS — D225 Melanocytic nevi of trunk: Secondary | ICD-10-CM | POA: Diagnosis not present

## 2024-08-23 DIAGNOSIS — Z1283 Encounter for screening for malignant neoplasm of skin: Secondary | ICD-10-CM | POA: Diagnosis not present

## 2024-08-23 DIAGNOSIS — L700 Acne vulgaris: Secondary | ICD-10-CM | POA: Diagnosis not present

## 2024-08-23 NOTE — Telephone Encounter (Signed)
 Left message to confirm patient wants to start injections.  Per Dr KANDICE not feasible at this time with husband's health condition.  Tried to leave message stating it is 1-2 times a week visit to our office.

## 2024-08-23 NOTE — Telephone Encounter (Signed)
 Patient called back and confirmed that she will NOT go through with the allergy  injections. Injection appointment has been canceled. Patient does not have the finances to come here and have someone sit with her husband weekly or twice weekly.

## 2024-08-30 ENCOUNTER — Encounter: Payer: Self-pay | Admitting: Nurse Practitioner

## 2024-08-30 ENCOUNTER — Ambulatory Visit: Attending: Nurse Practitioner | Admitting: Nurse Practitioner

## 2024-08-30 VITALS — BP 118/72 | HR 77 | Ht 64.0 in | Wt 150.8 lb

## 2024-08-30 DIAGNOSIS — I10 Essential (primary) hypertension: Secondary | ICD-10-CM | POA: Diagnosis not present

## 2024-08-30 DIAGNOSIS — R32 Unspecified urinary incontinence: Secondary | ICD-10-CM

## 2024-08-30 DIAGNOSIS — R600 Localized edema: Secondary | ICD-10-CM

## 2024-08-30 DIAGNOSIS — R631 Polydipsia: Secondary | ICD-10-CM | POA: Diagnosis not present

## 2024-08-30 DIAGNOSIS — R5383 Other fatigue: Secondary | ICD-10-CM

## 2024-08-30 DIAGNOSIS — I4892 Unspecified atrial flutter: Secondary | ICD-10-CM | POA: Diagnosis not present

## 2024-08-30 DIAGNOSIS — G629 Polyneuropathy, unspecified: Secondary | ICD-10-CM | POA: Diagnosis not present

## 2024-08-30 DIAGNOSIS — I5032 Chronic diastolic (congestive) heart failure: Secondary | ICD-10-CM | POA: Diagnosis not present

## 2024-08-30 DIAGNOSIS — I38 Endocarditis, valve unspecified: Secondary | ICD-10-CM

## 2024-08-30 NOTE — Patient Instructions (Signed)
 Medication Instructions:  Your physician has recommended you make the following change in your medication:   -Hold Jardiance  for 2 weeks, then update our office in 2 weeks.   *If you need a refill on your cardiac medications before your next appointment, please call your pharmacy*  Lab Work: We recommend your primary care provider get the following labs at your next visit: -Magnesium -A1C -CMET -CBC w/Diff.   If you have labs (blood work) drawn today and your tests are completely normal, you will receive your results only by: MyChart Message (if you have MyChart) OR A paper copy in the mail If you have any lab test that is abnormal or we need to change your treatment, we will call you to review the results.  Testing/Procedures: None  Follow-Up: At [facility], you and your health needs are our priority.  As part of our continuing mission to provide you with exceptional heart care, our providers are all part of one team.  This team includes your primary Cardiologist (physician) and Advanced Practice Providers or APPs (Physician Assistants and Nurse Practitioners) who all work together to provide you with the care you need, when you need it.  Your next appointment:   6 month(s)  Provider:   You may see Vishnu P Mallipeddi, MD or the following Advanced Practice Provider on your designated Care Team:   Almarie Crate, NP    We recommend signing up for the patient portal called MyChart.  Sign up information is provided on this After Visit Summary.  MyChart is used to connect with patients for Virtual Visits (Telemedicine).  Patients are able to view lab/test results, encounter notes, upcoming appointments, etc.  Non-urgent messages can be sent to your provider as well.   To learn more about what you can do with MyChart, go to [email or web address].   Other Instructions

## 2024-08-30 NOTE — Progress Notes (Unsigned)
 Cardiology Office Note:  .   Date:  08/30/2024 ID:  [name], DOB 1941-03-08, MRN [id number] PCP: Shona Norleen PEDLAR, MD   HeartCare Providers Cardiologist:  Diannah SHAUNNA Maywood, MD    History of Present Illness: .   [name] is a 83 y.o. female with a PMH of chronic diastolic CHF, DOE, Paroxysmal A-flutter, s/p DCCV 2024, HTN, valvular insufficiency, leg edema, who presents today for scheduled follow-up.   Noted to have SHOB since 12/2022. EKG showed A-flutter and underwent DCCV in March 2024. Completed 6 months of Amiodarone .  Last seen by Dr. Mallipeddi on February 17, 2024. She reported intermittent SOB at the time, weights were stable. She was started on Farxiga  10 mg once daily for diagnosis of chronic diastolic CHF.   Today she presents for follow-up. She states she has not been doing well recently. Admits to symptoms of urinary incontinence, increased thirst sensation, extreme fatigue, sugar cravings over the last few weeks.  Wondering if she has type 2 diabetes.  She continues to take Jardiance .  She is being followed at the wound center for what she describes as a prediabetic ulcer, has past history of cellulitis per her report.  Also admits to neuropathy.  Continues to have leg swelling, she wears her compression stockings regularly.  Does admit to feeling off balance at times, uses a cane although she feels as though it is a hindrance to use it. Denies any falls. Denies any chest pain, shortness of breath, palpitations, syncope, presyncope, dizziness, orthopnea, PND, significant weight changes, acute bleeding, or claudication. Admits to stress providing care for husband, does have assistance at home.    ROS: Negative. See HPI.  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 Member of book club.   Studies Reviewed: SABRA    EKG: EKG is not ordered today.   Lexiscan  05/2023:    Stress ECG is negative for ischemia and arrhythmias.    LV perfusion is normal. There is no evidence of ischemia. There is no evidence of infarction.   Left ventricular function is normal. Nuclear stress EF: 70 %.   Findings are consistent with no ischemia. The study is low risk.   Vascular ultrasound bilateral (DVT) 04/2023:  IMPRESSION: 1. No evidence of femoropopliteal DVT or superficial thrombophlebitis within either lower extremity. 2. Mild subcutaneous edema at the imaged distal LEFT lower extremity  Echo 04/2023:  1. Left ventricular ejection fraction, by estimation, is 60 to 65%. The  left ventricle has normal function. The left ventricle has no regional  wall motion abnormalities. Left ventricular diastolic parameters are  indeterminate. The average left  ventricular global longitudinal strain is -20.8 %. The global longitudinal  strain is normal.   2. Right ventricular systolic function is normal. The right ventricular  size is mildly enlarged. There is normal pulmonary artery systolic  pressure. The estimated right ventricular systolic pressure is 33.0 mmHg.   3. Left atrial size was mildly dilated.   4. Right atrial size was mildly dilated.   5. The mitral valve is grossly normal. Mild mitral valve regurgitation.   6. Tricuspid valve regurgitation is mild to moderate.   7. The aortic valve is tricuspid. Aortic valve regurgitation is mild. No  aortic stenosis is present. Aortic regurgitation PHT measures 1462 msec.  Aortic valve mean gradient measures 4.0 mmHg.   8. The inferior vena cava is normal in size with greater than 50%  respiratory variability, suggesting right atrial pressure  of 3 mmHg.   Comparison(s): Prior images unable to be directly viewed.  Physical Exam:   VS:  BP 118/72   Pulse 77   Ht 5' 4 (1.626 m)   Wt 150 lb 12.8 oz (68.4 kg)   SpO2 94%   BMI 25.88 kg/m    Wt Readings from Last 3 Encounters:  08/30/24 150 lb 12.8 oz (68.4 kg)  06/08/24 148 lb 12.8 oz (67.5 kg)  04/08/24 151 lb 14.4 oz (68.9 kg)     GEN: Well nourished, well developed in no acute distress NECK: No JVD; No carotid bruits CARDIAC: S1/S2, RRR, no murmurs, rubs, gallops RESPIRATORY:  Clear to auscultation without rales, wheezing or rhonchi  ABDOMEN: Soft, non-tender, non-distended EXTREMITIES:  Nonpitting edema to BLE (wearing compression stockings); No deformity   ASSESSMENT AND PLAN: .    Chronic diastolic CHF, leg edema, fatigue Stage C, NYHA class I-II symptoms. EF 60-65%. Euvolemic and well compensated on exam.  Did discuss that she may be having some concerning side effects of Jardiance , will hold medication for 1 to 2 weeks and she will update us  via MyChart or call her office with an update of her symptoms.  Did discuss possibly increasing her Lasix  dosage, patient declines.  Continue rest of medication regimen. Low sodium diet, fluid restriction <2L, and daily weights encouraged. Educated to contact our office for weight gain of 2 lbs overnight or 5 lbs in one week. Recommended that the following labs are obtained with her PCP at next visit: A1C, Mag, CMET, and CBC with differential - see below.   2.  Paroxysmal A-flutter, s/p DCCV in 2024 Denies any tachycardia or palpitations. HR is well controlled today and is not on any rate controlling medications. Continue Eliquis  5 mg BID for stroke prevention, on appropriate dosage and denies any bleeding issues.   3. HTN BP stable today. Discussed to monitor BP at home at least 2 hours after medications and sitting for 5-10 minutes.  She will continue to monitor her BP at home.  Discussed SBP goal is less than 140.  She will notify our office if SBP is consistently greater than 140.  No medication changes at this time. Continue to follow with PCP.  4.  Valvular insufficiency Echocardiogram in May 2024 revealed mild MR, mild to moderate TR, as well as mild AR.  She remains asymptomatic.  Plan to update echocardiogram in 3 years or sooner if clinically indicated.  5. Increased  thirst, urinary incontinence, neuropathy Recommended that her PCP obtain A1C to r/o T2DM. Care and ED precautions discussed. Will obtain Mag level as noted above. Continue to follow with PCP.   Dispo:  Follow-up with MD/APP in 6 months or sooner if anything changes.  Signed, Almarie Crate, NP

## 2024-08-31 ENCOUNTER — Other Ambulatory Visit: Payer: Self-pay | Admitting: Internal Medicine

## 2024-09-01 ENCOUNTER — Telehealth: Payer: Self-pay | Admitting: Lab

## 2024-09-01 NOTE — Telephone Encounter (Signed)
 error

## 2024-09-03 DIAGNOSIS — T148XXA Other injury of unspecified body region, initial encounter: Secondary | ICD-10-CM | POA: Diagnosis not present

## 2024-09-03 DIAGNOSIS — I739 Peripheral vascular disease, unspecified: Secondary | ICD-10-CM | POA: Diagnosis not present

## 2024-09-03 DIAGNOSIS — S91101A Unspecified open wound of right great toe without damage to nail, initial encounter: Secondary | ICD-10-CM | POA: Diagnosis not present

## 2024-09-03 DIAGNOSIS — X58XXXA Exposure to other specified factors, initial encounter: Secondary | ICD-10-CM | POA: Diagnosis not present

## 2024-09-05 ENCOUNTER — Other Ambulatory Visit: Payer: Self-pay | Admitting: Internal Medicine

## 2024-09-06 ENCOUNTER — Other Ambulatory Visit: Payer: Self-pay | Admitting: *Deleted

## 2024-09-06 DIAGNOSIS — I10 Essential (primary) hypertension: Secondary | ICD-10-CM

## 2024-09-06 DIAGNOSIS — I5032 Chronic diastolic (congestive) heart failure: Secondary | ICD-10-CM

## 2024-09-06 DIAGNOSIS — Z79899 Other long term (current) drug therapy: Secondary | ICD-10-CM

## 2024-09-06 DIAGNOSIS — R631 Polydipsia: Secondary | ICD-10-CM

## 2024-09-06 DIAGNOSIS — R5383 Other fatigue: Secondary | ICD-10-CM

## 2024-09-06 NOTE — Telephone Encounter (Signed)
 Prescription refill request for Eliquis  received. Indication: AF Last office visit: 08/30/24  Sylvia Crate NP Scr: 1.00 on 03/03/24  Epic Age: 83 Weight: 68.4kg  Based on above findings Eliquis  5mg  twice daily is the appropriate dose.  Refill approved.

## 2024-09-08 ENCOUNTER — Ambulatory Visit

## 2024-09-14 ENCOUNTER — Other Ambulatory Visit: Payer: Self-pay

## 2024-09-14 ENCOUNTER — Ambulatory Visit (HOSPITAL_COMMUNITY): Admitting: Physical Therapy

## 2024-09-14 DIAGNOSIS — M79672 Pain in left foot: Secondary | ICD-10-CM | POA: Insufficient documentation

## 2024-09-14 DIAGNOSIS — S91101D Unspecified open wound of right great toe without damage to nail, subsequent encounter: Secondary | ICD-10-CM | POA: Insufficient documentation

## 2024-09-14 NOTE — Therapy (Addendum)
 OUTPATIENT PHYSICAL THERAPY NEURO EVALUATION   Patient Name: [name] MRN: [id number] DOB:07-11-1941, 83 y.o., female Today's Date: 09/14/2024   PCP: Gladis Lauraine BRAVO, NP REFERRING PROVIDER: Gladis Lauraine BRAVO, NP  END OF SESSION:  PT End of Session - 09/14/24 1357     Visit Number 1    Number of Visits 4    Date for Recertification  10/12/24    Authorization Type [employer] authorization put in    Progress Note Due on Visit 4    PT Start Time 1300    PT Stop Time 1340    PT Time Calculation (min) 40 min    Activity Tolerance Patient tolerated treatment well    Behavior During Therapy [facility] for tasks assessed/performed          Past Medical History:  Diagnosis Date   Anemia    Anxiety    Arthritis    Asthma    Atrial flutter (HCC)    Constipation    Dysrhythmia    GERD (gastroesophageal reflux disease)    High blood pressure    History of COVID-19 12/28/2020   Hypothyroidism    PAD (peripheral artery disease)    bilateral legs   Peripheral edema    Peripheral neuropathy    bilateral feet at night occ   Psoriasis    Seasonal allergies    Skin cancer    Left eye brow   Varicose veins    Vertigo 11/23/2013   Past Surgical History:  Procedure Laterality Date   APPENDECTOMY     patient unsure   BIOPSY  12/21/2020   Procedure: BIOPSY;  Surgeon: Shaaron Lamar HERO, MD;  [facility];  Service: Endoscopy;;   BIOPSY  05/28/2021   Procedure: BIOPSY;  Surgeon: Cindie Carlin POUR, DO;  [facility];  Service: Endoscopy;;   CARDIOVERSION N/A 04/04/2023   Procedure: CARDIOVERSION;  Surgeon: Alvan Dorn FALCON, MD;  Location: [facility];  Service: Endoscopy;  Laterality: N/A;   CATARACT EXTRACTION W/PHACO Left 01/27/2017   Procedure: CATARACT EXTRACTION PHACO AND INTRAOCULAR LENS PLACEMENT (IOC);  Surgeon: Cherene Mania, MD;  Location: [facility];  Service: Ophthalmology;  Laterality: Left;  CDE: 29.90   CATARACT EXTRACTION W/PHACO Right 02/24/2017    Procedure: CATARACT EXTRACTION PHACO AND INTRAOCULAR LENS PLACEMENT (IOC);  Surgeon: Cherene Mania, MD;  Location: [facility];  Service: Ophthalmology;  Laterality: Right;  CDE: 8.57   CHOLECYSTECTOMY     COLONOSCOPY N/A 10/01/2016   Procedure: COLONOSCOPY;  Surgeon: Lamar HERO Shaaron, MD; pancolonic diverticulosis, melanosis coli, otherwise normal exam.  No recommendations to repeat colonoscopy.   COLONOSCOPY N/A 04/12/2024   Procedure: COLONOSCOPY;  Surgeon: Shaaron Lamar HERO, MD;  [facility];  Service: Endoscopy;  Laterality: N/A;  8:45 am, asa 3   COLONOSCOPY WITH PROPOFOL  N/A 12/21/2020   Rourk: Diverticulosis   ESOPHAGOGASTRODUODENOSCOPY N/A 04/12/2024   Procedure: EGD (ESOPHAGOGASTRODUODENOSCOPY);  Surgeon: Shaaron Lamar HERO, MD;  [facility];  Service: Endoscopy;  Laterality: N/A;   ESOPHAGOGASTRODUODENOSCOPY (EGD) WITH PROPOFOL  N/A 12/21/2020   Rourk: Erosive reflux esophagitis, nonbleeding gastric ulcers with biopsy showing erosion/ulceration, negative for H. pylori.   ESOPHAGOGASTRODUODENOSCOPY (EGD) WITH PROPOFOL  N/A 05/28/2021   Carver: gastritis (reactive gastropathy), no H.pylori   FLEXOR TENDON REPAIR Right 11/06/2017   Procedure: RIGHT WRIST FLEXOR TENDON REPAIRAND STT DEBRIEDMENT;  Surgeon: Murrell Drivers, MD;  Location: [facility];  Service: Orthopedics;  Laterality: Right;   MOUTH SURGERY     artery  bleed   TOTAL HIP ARTHROPLASTY Right 01/31/2021   Procedure: TOTAL HIP ARTHROPLASTY ANTERIOR APPROACH;  Surgeon: Fidel Rogue, MD;  Location: [facility];  Service: Orthopedics;  Laterality: Right;   TUBAL LIGATION     Patient Active Problem List   Diagnosis Date Noted   Normocytic anemia 02/17/2024   Heme positive stool 02/17/2024   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) 10/29/2023   ERRONEOUS ENCOUNTER--DISREGARD 10/16/2023   Chronic diastolic heart failure (HCC) 08/15/2023   Chest pain of uncertain etiology 05/19/2023    Aortic regurgitation 05/19/2023   Paroxysmal atrial flutter (HCC) 02/20/2023   Seasonal and perennial allergic rhinitis 10/16/2022   Allergic rhinitis 09/10/2022   External hemorrhoids 08/22/2022   Neuropathy 07/30/2022   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/2022   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   Osteoarthritis of right knee 02/21/2022   Increased frequency of urination 02/20/2022   Intermittent confusion 02/20/2022   Urinary incontinence 02/20/2022   Posterior rhinorrhea 01/04/2022   Fatigue 12/24/2021   Impairment of balance 09/20/2021   Chronic insomnia 06/20/2021   Chronic low back pain 06/20/2021   Edema of lower extremity 06/20/2021   Gastroesophageal reflux disease without esophagitis 06/20/2021   Hypothyroidism 06/20/2021   Major depressive disorder 06/20/2021   Osteopenia 06/20/2021   Mixed hyperlipidemia 06/15/2021   Type 2 diabetes mellitus (HCC) 06/15/2021   Gastritis and gastroduodenitis 06/14/2021   Mixed anxiety and depressive disorder 06/01/2021   Gastric ulcer 04/13/2021   Abdominal pain 03/15/2021   Fracture of femoral neck, right (HCC) 01/31/2021   Closed displaced fracture of right femoral neck (HCC) 01/31/2021   Indigestion 11/02/2020   Change in stool caliber 11/02/2020   Loss of weight 08/23/2020   Nausea without vomiting 06/15/2020   Tick bite 04/10/2020   Medication refill 04/10/2020   Acquired trigger finger 03/28/2020   Pain in right knee 10/08/2019   Cellulitis, leg 08/10/2019   Cellulitis 08/08/2019   Fever 08/08/2019   Cellulitis of foot 08/08/2019   Hypokalemia 08/08/2019   Hyponatremia 08/08/2019   Essential hypertension 08/08/2019   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   Adverse food reaction 09/24/2018   Mild persistent asthma without complication 09/24/2018   Pain of left hip joint 07/17/2018   DOE (dyspnea on exertion) 01/24/2017   Cough variant asthma  vs UACS 01/24/2017   High risk medication use 09/25/2016   Varicose veins of  bilateral lower extremities with other complications 03/07/2014   Varicose veins 02/15/2014   Vertigo 11/23/2013   Pain in limb 10/04/2013   Hip bursitis 02/11/2013   Constipation 02/16/2009   RECTAL BLEEDING 02/16/2009   History of cardiovascular disorder 01/29/2008    ONSET DATE: 09/05/24  REFERRING DIAG: wound of skin  THERAPY DIAG:  Pain in left foot  Rationale for Evaluation and Treatment: Rehabilitation     Wound Therapy - 09/14/24 0001     Subjective PT states that she had cellulitis four years ago that started in her toe.  It ended up that her whole leg was involved and she was in the hospital for a week.  She started having pain in her toe at the end of 2024 and went to the wound care center in [place] from November 2024 thru late Jan 2025 as well as having a nurse come to her home three times a week to change the dressing.  She is now having pain in her toe again.  She only lives a couple of miles away from  this clinic therefore she opted to come to this facility for wound care.    Patient and Family Stated Goals no pain , wound to heal.    Date of Onset --   chronic but recent pain started 09/05/24.   Prior Treatments not at this time    Pain Scale 0-10    Pain Score 10-Worst pain ever    Pain Type Acute pain    Pain Location Toe (Comment which one)    Pain Orientation Right   great   Pain Descriptors / Indicators Pressure    Pain Onset Other (Comment)   pain if she is walking without her inserts   Patients Stated Pain Goal 0    Pain Intervention(s) Other (Comment)   shaving of callous   Evaluation and Treatment Procedures Explained to Patient/Family Yes    Evaluation and Treatment Procedures agreed to    Wound Properties Date First Assessed: 09/14/24 Time First Assessed: 1310 Present on Original Admission: Yes Primary Wound Type: Pressure Injury Location: Toe (Comment  which one) , great  Location Orientation: Right   Wound Image Images linked: 1    Site / Wound  Assessment Dry;Clean;Pale    Peri-wound Assessment Intact    Wound Length (cm) 0.5 cm   darken area appears to be dry blood but is enclosed beneath the callous.   Wound Width (cm) 0.2 cm    Wound Surface Area (cm^2) 0.08 cm^2    Drainage Amount None    Dressing Type None    Wound Therapy - Clinical Statement see below    Factors Delaying/Impairing Wound Healing Altered sensation;Vascular compromise    Hydrotherapy Plan Debridement;Patient/family education    Wound Therapy - Frequency Other (comment)   one time a week for 2 weeks then once every other week for 2 weeks.   Wound Therapy - Current Recommendations PT;Other (comment)   orthotic for new orthotic current is 83 years old .   Wound Plan debride wound care if/as necessary    Dressing  none            PATIENT EDUCATION: Education details: That therapist is going to debride the callous  Person educated: Patient Education method: Explanation Education comprehension: verbalized understanding   HOME EXERCISE PROGRAM: none   GOALS: Goals reviewed with patient? Yes  SHORT TERM GOALS: Target date: 09/22/24  PT pain to be no greater than a 5/10 at Rt big toe area.  Baseline: Goal status: INITIAL   LONG TERM GOALS: Target date: 10/12/24  PT to have no pain in her Rt big toe area.  Baseline:  Goal status: INITIAL  ASSESSMENT:  CLINICAL IMPRESSION: Patient is a 83 y.o. female who was seen today for physical therapy evaluation and treatment for wound on pt Rt great toe.  Pt has a significant callous which is covering up an old wound that is not open at this time.  At this time there is a small .5x0.2 cm area that appears to dried blood with a significant callous over this area.  The pt states that she went to the wound center for three months for debridement of this area.  The therapist used a warm cloth for 10 minutes prior to sharp debridement of the area to soften the callous.  The therapist used a scalpel and forcep to  remove layers of the callous.  Pt was educated that at this time there is no open wound and we do not want to cause one, however, debriding the callous  over the wound will most likely decrease the pressure and reduce her pain.  Ms. Garnette will benefit from skilled PT to assist in relieving pressure from her great toe to decrease the pain and prevent her wound from re-opening.   OBJECTIVE IMPAIRMENTS: Abnormal gait, decreased activity tolerance, decreased balance, difficulty walking, pain, and decreased skin integrity .   ACTIVITY LIMITATIONS: carrying, lifting, and locomotion level  PARTICIPATION LIMITATIONS: community activity  PERSONAL FACTORS: Age and Past/current experiences are also affecting patient's functional outcome.   REHAB POTENTIAL: Good  CLINICAL DECISION MAKING: Evolving/moderate complexity  EVALUATION COMPLEXITY: Moderate  PLAN: PT FREQUENCY: 1x/week  PT DURATION: 4 weeks  PLANNED INTERVENTIONS: 97535- Self Care, 02859- Manual therapy, 97597- Wound care (first 20 sq cm), and Patient/Family education  PLAN FOR NEXT SESSION: continue debridement of callous if needed assess that wound is not opened.   Montie Metro, PT CLT [phone]  09/14/2024, 2:12 PM  [employer] Auth Request Treatment Start Date: 09/14/24  Referring diagnosis code (ICD 10)? S91.101A Treatment diagnosis codes (ICD 10)? (if different than referring diagnosis) [phone] What was this (referring dx) caused by? []  Surgery []  Fall []  Ongoing issue [x]  Arthritis []  Other: ____________  Laterality: [x]  Rt []  Lt []  Both  Deficits: [x]  Pain []  Stiffness []  Weakness []  Edema [x]  Balance Deficits []  Coordination []  Gait Disturbance []  ROM []  Other   Large callous over prior wound causing pressure, thus pain with ambulation.   CPT codes: See Planned Interventions listed in the Plan section of the Evaluation.

## 2024-09-16 ENCOUNTER — Encounter (HOSPITAL_COMMUNITY): Payer: Self-pay | Admitting: Physical Therapy

## 2024-09-16 DIAGNOSIS — R7301 Impaired fasting glucose: Secondary | ICD-10-CM | POA: Diagnosis not present

## 2024-09-16 DIAGNOSIS — I5032 Chronic diastolic (congestive) heart failure: Secondary | ICD-10-CM | POA: Diagnosis not present

## 2024-09-16 DIAGNOSIS — R6889 Other general symptoms and signs: Secondary | ICD-10-CM | POA: Diagnosis not present

## 2024-09-16 NOTE — Therapy (Unsigned)
 PHYSICAL THERAPY DISCHARGE SUMMARY  Visits from Start of Care: 1  Current functional level related to goals / functional outcomes: No more pain   Remaining deficits: none   Education / Equipment: N/A   Patient agrees to discharge. Patient goals were met. Patient is being discharged due to being pleased with the current functional level.   Montie Metro, PT CLT [phone]

## 2024-09-17 ENCOUNTER — Ambulatory Visit (HOSPITAL_COMMUNITY): Admitting: Physical Therapy

## 2024-09-20 ENCOUNTER — Telehealth: Payer: Self-pay | Admitting: Internal Medicine

## 2024-09-20 NOTE — Telephone Encounter (Signed)
 Patient notified and verbalized understanding.

## 2024-09-20 NOTE — Telephone Encounter (Signed)
 Pt c/o medication issue:  1. Name of Medication:   empagliflozin  (JARDIANCE ) 10 MG TABS tablet   2. How are you currently taking this medication (dosage and times per day)?   Currently not taking  3. Are you having a reaction (difficulty breathing--STAT)?   4. What is your medication issue?   Patient called to report she had stopped taking this medication due to frequent urination and is doing fine off of this medication. Patient noted she is still taking her fluid pill and wants to confirm next steps.

## 2024-09-21 ENCOUNTER — Ambulatory Visit (HOSPITAL_COMMUNITY): Admitting: Physical Therapy

## 2024-09-23 ENCOUNTER — Ambulatory Visit: Admitting: Podiatry

## 2024-09-23 ENCOUNTER — Encounter: Payer: Self-pay | Admitting: Podiatry

## 2024-09-23 VITALS — Ht 64.0 in | Wt 150.8 lb

## 2024-09-23 DIAGNOSIS — E876 Hypokalemia: Secondary | ICD-10-CM | POA: Diagnosis not present

## 2024-09-23 DIAGNOSIS — E039 Hypothyroidism, unspecified: Secondary | ICD-10-CM | POA: Diagnosis not present

## 2024-09-23 DIAGNOSIS — L97511 Non-pressure chronic ulcer of other part of right foot limited to breakdown of skin: Secondary | ICD-10-CM

## 2024-09-23 DIAGNOSIS — M79675 Pain in left toe(s): Secondary | ICD-10-CM

## 2024-09-23 DIAGNOSIS — B351 Tinea unguium: Secondary | ICD-10-CM | POA: Diagnosis not present

## 2024-09-23 DIAGNOSIS — D6869 Other thrombophilia: Secondary | ICD-10-CM | POA: Diagnosis not present

## 2024-09-23 DIAGNOSIS — M79674 Pain in right toe(s): Secondary | ICD-10-CM | POA: Diagnosis not present

## 2024-09-23 DIAGNOSIS — Z7901 Long term (current) use of anticoagulants: Secondary | ICD-10-CM | POA: Diagnosis not present

## 2024-09-23 DIAGNOSIS — I110 Hypertensive heart disease with heart failure: Secondary | ICD-10-CM | POA: Diagnosis not present

## 2024-09-23 DIAGNOSIS — I10 Essential (primary) hypertension: Secondary | ICD-10-CM | POA: Diagnosis not present

## 2024-09-23 DIAGNOSIS — I5032 Chronic diastolic (congestive) heart failure: Secondary | ICD-10-CM | POA: Diagnosis not present

## 2024-09-23 DIAGNOSIS — N289 Disorder of kidney and ureter, unspecified: Secondary | ICD-10-CM | POA: Diagnosis not present

## 2024-09-23 DIAGNOSIS — E1142 Type 2 diabetes mellitus with diabetic polyneuropathy: Secondary | ICD-10-CM | POA: Diagnosis not present

## 2024-09-23 DIAGNOSIS — I4892 Unspecified atrial flutter: Secondary | ICD-10-CM | POA: Diagnosis not present

## 2024-09-24 ENCOUNTER — Ambulatory Visit (HOSPITAL_COMMUNITY): Admitting: Physical Therapy

## 2024-09-27 NOTE — Progress Notes (Signed)
 Subjective: Chief Complaint  Patient presents with   Nail Problem    Pt is here for nail and callous trim.    83 year old female presents the office with above concerns.  She presents states her nails are thick and elongated she has difficulty trimming them herself.  She is also concerned of an ulcer on her right foot on the other previous callus.  She is concerned because the callus does hurt.  She denies any open lesions, drainage or pus or any signs of infection.  No fevers or chills.    She is on Eliquis   Objective: AAO x3, NAD DP/PT pulses palpable bilaterally, CRT less than 3 seconds Hyperkeratotic preulcerative lesion noted on the right hallux medially.  Some amount of dried blood present in the areas does have 1 small superficial area of skin breakdown without any probing, undermining or tunneling.  There is no surrounding erythema, ascending cellulitis.  Is no fluctuation or crepitation.  There is no malodor. Nails are hypertrophic, dystrophic, brittle, discolored, elongated 10. No surrounding redness or drainage. Tenderness nails 1-5 bilaterally. No pain with calf compression, swelling, warmth, erythema  Assessment: Preulcerative callus; symptomatic onychomycosis  Plan: Preulcerative lesion/ulceration - Sharply debrided x 1 without any complications or bleeding.  1 small superficial area of skin breakdown noted.  Recommended Silvadene dressing changes daily which I prescribed today.  Continue offloading. -Previously discuss surgery for the tail as well but she is not interested in this.  She is to monitor closely and continue offloading.  Symptomatic onychomycosis - Sharply debrided nails x 10 without any complications or bleeding.  Return in about 3 weeks (around 10/14/2024) for toe ulcer and also with Lolita for insert modifications.   [name] Fees DPM

## 2024-09-28 ENCOUNTER — Ambulatory Visit (HOSPITAL_COMMUNITY): Admitting: Physical Therapy

## 2024-09-28 ENCOUNTER — Telehealth: Payer: Self-pay

## 2024-09-28 ENCOUNTER — Other Ambulatory Visit: Payer: Self-pay | Admitting: Podiatry

## 2024-09-28 MED ORDER — SILVER SULFADIAZINE 1 % EX CREA: 1.0000 | TOPICAL_CREAM | Freq: Every day | CUTANEOUS | 0 refills | Status: AC

## 2024-09-28 NOTE — Telephone Encounter (Signed)
 Patient called and left a message - was seen 10/23 - we were going to send a prescription to [place] - she has called and they do not have anything for her

## 2024-09-29 NOTE — Telephone Encounter (Signed)
 Left message informing patient.

## 2024-10-01 ENCOUNTER — Ambulatory Visit (HOSPITAL_COMMUNITY): Admitting: Physical Therapy

## 2024-10-01 DIAGNOSIS — Z1231 Encounter for screening mammogram for malignant neoplasm of breast: Secondary | ICD-10-CM | POA: Diagnosis not present

## 2024-10-04 DIAGNOSIS — L570 Actinic keratosis: Secondary | ICD-10-CM | POA: Diagnosis not present

## 2024-10-04 DIAGNOSIS — D2239 Melanocytic nevi of other parts of face: Secondary | ICD-10-CM | POA: Diagnosis not present

## 2024-10-04 DIAGNOSIS — X32XXXD Exposure to sunlight, subsequent encounter: Secondary | ICD-10-CM | POA: Diagnosis not present

## 2024-10-05 ENCOUNTER — Ambulatory Visit (HOSPITAL_COMMUNITY): Admitting: Physical Therapy

## 2024-10-07 ENCOUNTER — Ambulatory Visit (HOSPITAL_COMMUNITY): Admitting: Physical Therapy

## 2024-10-12 ENCOUNTER — Ambulatory Visit (HOSPITAL_COMMUNITY): Admitting: Physical Therapy

## 2024-10-14 ENCOUNTER — Ambulatory Visit (HOSPITAL_COMMUNITY): Admitting: Physical Therapy

## 2024-10-19 ENCOUNTER — Ambulatory Visit (HOSPITAL_COMMUNITY): Admitting: Physical Therapy

## 2024-10-19 ENCOUNTER — Other Ambulatory Visit: Payer: Self-pay | Admitting: Internal Medicine

## 2024-10-19 NOTE — Telephone Encounter (Signed)
 Please advise Dr. Darlean pt is requesting refill for Pepcid 

## 2024-10-21 ENCOUNTER — Ambulatory Visit: Admitting: Podiatry

## 2024-10-21 ENCOUNTER — Ambulatory Visit (HOSPITAL_COMMUNITY): Admitting: Physical Therapy

## 2024-10-25 DIAGNOSIS — S46911A Strain of unspecified muscle, fascia and tendon at shoulder and upper arm level, right arm, initial encounter: Secondary | ICD-10-CM | POA: Diagnosis not present

## 2024-11-07 ENCOUNTER — Ambulatory Visit
Admission: EM | Admit: 2024-11-07 | Discharge: 2024-11-07 | Disposition: A | Discharge status: Home / Self Care | Attending: Family Medicine | Admitting: Family Medicine

## 2024-11-07 DIAGNOSIS — J069 Acute upper respiratory infection, unspecified: Secondary | ICD-10-CM | POA: Diagnosis not present

## 2024-11-07 DIAGNOSIS — J4541 Moderate persistent asthma with (acute) exacerbation: Secondary | ICD-10-CM

## 2024-11-07 LAB — POC COVID19/FLU A&B COMBO
Covid Antigen, POC: NEGATIVE
Influenza A Antigen, POC: NEGATIVE
Influenza B Antigen, POC: NEGATIVE

## 2024-11-07 NOTE — ED Provider Notes (Signed)
 [facility]    CSN: [id number] Arrival date & time: 11/07/24  1051      History   Chief Complaint No chief complaint on file.   HPI [name] is a 83 y.o. female.   Patient presenting today with several day history of nasal congestion, hoarseness, sore throat, cough, wheezing, fatigue, body aches.  Denies fever, chest pain, shortness of breath, abdominal pain, vomiting, diarrhea.  Past medical history significant for asthma on daily Breztri , albuterol  as needed, seasonal allergies on antihistamines and nasal sprays.  So far in addition to her inhalers she is trying [employer], salt water  gargles.    Past Medical History:  Diagnosis Date   Anemia    Anxiety    Arthritis    Asthma    Atrial flutter (HCC)    Constipation    Dysrhythmia    GERD (gastroesophageal reflux disease)    High blood pressure    History of COVID-19 12/28/2020   Hypothyroidism    PAD (peripheral artery disease)    bilateral legs   Peripheral edema    Peripheral neuropathy    bilateral feet at night occ   Psoriasis    Seasonal allergies    Skin cancer    Left eye brow   Varicose veins    Vertigo 11/23/2013    Patient Active Problem List   Diagnosis Date Noted   Normocytic anemia 02/17/2024   Heme positive stool 02/17/2024   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) 10/29/2023   ERRONEOUS ENCOUNTER--DISREGARD 10/16/2023   Chronic diastolic heart failure (HCC) 08/15/2023   Chest pain of uncertain etiology 05/19/2023   Aortic regurgitation 05/19/2023   Paroxysmal atrial flutter (HCC) 02/20/2023   Seasonal and perennial allergic rhinitis 10/16/2022   Allergic rhinitis 09/10/2022   External hemorrhoids 08/22/2022   Neuropathy 07/30/2022   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/2022   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   Osteoarthritis of right knee 02/21/2022   Increased frequency of urination 02/20/2022   Intermittent confusion 02/20/2022   Urinary  incontinence 02/20/2022   Posterior rhinorrhea 01/04/2022   Fatigue 12/24/2021   Impairment of balance 09/20/2021   Chronic insomnia 06/20/2021   Chronic low back pain 06/20/2021   Edema of lower extremity 06/20/2021   Gastroesophageal reflux disease without esophagitis 06/20/2021   Hypothyroidism 06/20/2021   Major depressive disorder 06/20/2021   Osteopenia 06/20/2021   Mixed hyperlipidemia 06/15/2021   Type 2 diabetes mellitus (HCC) 06/15/2021   Gastritis and gastroduodenitis 06/14/2021   Mixed anxiety and depressive disorder 06/01/2021   Gastric ulcer 04/13/2021   Abdominal pain 03/15/2021   Fracture of femoral neck, right (HCC) 01/31/2021   Closed displaced fracture of right femoral neck (HCC) 01/31/2021   Indigestion 11/02/2020   Change in stool caliber 11/02/2020   Loss of weight 08/23/2020   Nausea without vomiting 06/15/2020   Tick bite 04/10/2020   Medication refill 04/10/2020   Acquired trigger finger 03/28/2020   Pain in right knee 10/08/2019   Cellulitis, leg 08/10/2019   Cellulitis 08/08/2019   Fever 08/08/2019   Cellulitis of foot 08/08/2019   Hypokalemia 08/08/2019   Hyponatremia 08/08/2019   Essential hypertension 08/08/2019   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   Adverse food reaction 09/24/2018   Mild persistent asthma without complication 09/24/2018   Pain of left hip joint 07/17/2018   DOE (dyspnea on exertion) 01/24/2017   Cough variant asthma  vs UACS 01/24/2017   High risk medication use  09/25/2016   Varicose veins of bilateral lower extremities with other complications 03/07/2014   Varicose veins 02/15/2014   Vertigo 11/23/2013   Pain in limb 10/04/2013   Hip bursitis 02/11/2013   Constipation 02/16/2009   RECTAL BLEEDING 02/16/2009   History of cardiovascular disorder 01/29/2008    Past Surgical History:  Procedure Laterality Date   APPENDECTOMY     patient unsure   BIOPSY  12/21/2020   Procedure: BIOPSY;  Surgeon: Shaaron Lamar HERO,  MD;  Location: [facility];  Service: Endoscopy;;   BIOPSY  05/28/2021   Procedure: BIOPSY;  Surgeon: Cindie Carlin POUR, DO;  Location: [facility];  Service: Endoscopy;;   CARDIOVERSION N/A 04/04/2023   Procedure: CARDIOVERSION;  Surgeon: Alvan Dorn FALCON, MD;  Location: [facility];  Service: Endoscopy;  Laterality: N/A;   CATARACT EXTRACTION W/PHACO Left 01/27/2017   Procedure: CATARACT EXTRACTION PHACO AND INTRAOCULAR LENS PLACEMENT (IOC);  Surgeon: Cherene Mania, MD;  Location: [facility];  Service: Ophthalmology;  Laterality: Left;  CDE: 29.90   CATARACT EXTRACTION W/PHACO Right 02/24/2017   Procedure: CATARACT EXTRACTION PHACO AND INTRAOCULAR LENS PLACEMENT (IOC);  Surgeon: Cherene Mania, MD;  Location: [facility];  Service: Ophthalmology;  Laterality: Right;  CDE: 8.57   CHOLECYSTECTOMY     COLONOSCOPY N/A 10/01/2016   Procedure: COLONOSCOPY;  Surgeon: Lamar HERO Shaaron, MD; pancolonic diverticulosis, melanosis coli, otherwise normal exam.  No recommendations to repeat colonoscopy.   COLONOSCOPY N/A 04/12/2024   Procedure: COLONOSCOPY;  Surgeon: Shaaron Lamar HERO, MD;  Location: [facility];  Service: Endoscopy;  Laterality: N/A;  8:45 am, asa 3   COLONOSCOPY WITH PROPOFOL  N/A 12/21/2020   Rourk: Diverticulosis   ESOPHAGOGASTRODUODENOSCOPY N/A 04/12/2024   Procedure: EGD (ESOPHAGOGASTRODUODENOSCOPY);  Surgeon: Shaaron Lamar HERO, MD;  Location: [facility];  Service: Endoscopy;  Laterality: N/A;   ESOPHAGOGASTRODUODENOSCOPY (EGD) WITH PROPOFOL  N/A 12/21/2020   Rourk: Erosive reflux esophagitis, nonbleeding gastric ulcers with biopsy showing erosion/ulceration, negative for H. pylori.   ESOPHAGOGASTRODUODENOSCOPY (EGD) WITH PROPOFOL  N/A 05/28/2021   Carver: gastritis (reactive gastropathy), no H.pylori   FLEXOR TENDON REPAIR Right 11/06/2017   Procedure: RIGHT WRIST FLEXOR TENDON REPAIRAND STT DEBRIEDMENT;  Surgeon: Murrell Drivers, MD;  Location: [facility];  Service: Orthopedics;  Laterality:  Right;   MOUTH SURGERY     artery bleed   TOTAL HIP ARTHROPLASTY Right 01/31/2021   Procedure: TOTAL HIP ARTHROPLASTY ANTERIOR APPROACH;  Surgeon: Fidel Rogue, MD;  Location: [facility];  Service: Orthopedics;  Laterality: Right;   TUBAL LIGATION      OB History     Gravida  2   Para  2   Term      Preterm      AB      Living  2      SAB      IAB      Ectopic      Multiple      Live Births  2            Home Medications    Prior to Admission medications   Medication Sig Start Date End Date Taking? Authorizing Provider  acetaminophen  (TYLENOL ) 500 MG tablet Take 500-1,000 mg by mouth as needed for mild pain or moderate pain.    [provider]  albuterol  (VENTOLIN  HFA) 108 (90 Base) MCG/ACT inhaler Inhale 2 puffs into the lungs every 6 (six) hours as needed for wheezing or shortness of breath. 06/16/23   Tobie Arleta SQUIBB, MD  ALPRAZolam  (XANAX ) 0.5 MG tablet Take 0.5 mg by mouth in the morning, at noon, and at bedtime. (1000, 1500, AT BEDTIME)    [provider]  azelastine  (ASTELIN ) 0.1 % nasal spray Place 1 spray into both nostrils 2 (two) times daily. 06/16/23   Tobie Arleta SQUIBB, MD  Budeson-Glycopyrrol-Formoterol  (BREZTRI  AEROSPHERE) 160-9-4.8 MCG/ACT AERO Inhale 2 puffs into the lungs daily. 01/09/24   Iva Marty Saltness, MD  ELIQUIS  5 MG TABS tablet TAKE (1) TABLET BY MOUTH TWICE DAILY. 09/06/24   Mallipeddi, Vishnu P, MD  famotidine  (PEPCID ) 20 MG tablet TAKE ONE TABLET BY MOUTH DAILY AFTER SUPPER 10/19/24   Wert, Michael B, MD  fluocinonide gel (LIDEX) 0.05 % Apply 1 Application topically 2 (two) times daily. 07/09/23   [provider]  fluticasone  (FLONASE ) 50 MCG/ACT nasal spray USE 2 SPRAYS INTO BOTH NOSTRILS EVERY MORNING 02/10/24   Iva Marty Saltness, MD  furosemide  (LASIX ) 40 MG tablet TAKE ONE TABLET BY MOUTH EVERY DAY. MAY TAKE ADDITIONAL TABLET AS NEEDED IF SHORT OF BREATH CONTINUES 06/22/24   Mallipeddi, Vishnu P, MD   levocetirizine (XYZAL ) 5 MG tablet Take 1 tablet (5 mg total) by mouth daily as needed for allergies (Can take an extra dose during flare ups.). 07/19/24   Iva Marty Saltness, MD  levothyroxine  (SYNTHROID ) 50 MCG tablet Take 1 tablet (50 mcg total) by mouth daily before breakfast. 06/08/24   Therisa Benton PARAS, NP  losartan  (COZAAR ) 50 MG tablet Take 50 mg by mouth 2 (two) times daily. 01/07/23   [provider]  Multiple Vitamin (MULTIVITAMIN WITH MINERALS) TABS tablet Take 1 tablet by mouth daily in the afternoon.    [provider]  NONFORMULARY OR COMPOUNDED ITEM Apply 1 Application topically 3 (three) times daily as needed (nerve pain.). Baclofen 2%/Diclofenac Sodium 3%/Gabapentin  5%/Lidocaine  HCl 5%/Menthol  1% Topical Gel    [provider]  pantoprazole  (PROTONIX ) 40 MG tablet TAKE 1 TABLET BY MOUTH ONCE A DAY. 06/09/24   Ezzard Sonny RAMAN, PA-C  polyethylene glycol (MIRALAX  / GLYCOLAX ) 17 g packet Take 17 g by mouth every evening.    [provider]  potassium chloride  (KLOR-CON ) 10 MEQ tablet Take 2 tablets (20 mEq total) by mouth daily. 02/11/24   Mallipeddi, Vishnu P, MD  raloxifene  (EVISTA ) 60 MG tablet Take 1 tablet (60 mg total) by mouth every morning. 04/10/20   Signa Delon LABOR, NP  silver  sulfADIAZINE  (SILVADENE ) 1 % cream Apply 1 Application topically daily. 09/28/24   Gershon Donnice SAUNDERS, DPM  SODIUM FLUORIDE 5000 PPM 1.1 % PSTE Take by mouth at bedtime. 09/19/23   [provider]  venlafaxine  XR (EFFEXOR -XR) 75 MG 24 hr capsule Take 75 mg by mouth daily after breakfast.    [provider]  vitamin D3 (CHOLECALCIFEROL) 25 MCG tablet Take 1,000 Units by mouth daily.    [provider]    Family History Family History  Problem Relation Age of Onset   Hypertension Mother    Varicose Veins Father    Heart attack Father    Heart disease Father    Hypertension Brother    COPD Brother     Social History Social History    Tobacco Use   Smoking status: Former    Current packs/day: 0.00    Average packs/day: 0.3 packs/day for 5.0 years (1.3 ttl pk-yrs)    Types: Cigarettes    Start date: 12/02/1992    Quit date: 12/02/1997    Years since quitting: 26.9  Passive exposure: Past   Smokeless tobacco: Never  Vaping Use   Vaping status: Never Used  Substance Use Topics   Alcohol  use: Not Currently    Alcohol /week: 1.0 standard drink of alcohol     Types: 1 Glasses of wine per week   Drug use: No     Allergies   Amoxicillin-pot clavulanate, Ace inhibitors, Guaifenesin, Lisinopril, Cetirizine , Clarithromycin , Entex la, Gabapentin , Jardiance  [empagliflozin ], Levofloxacin, and Meloxicam    Review of Systems Review of Systems Per HPI  Physical Exam Triage Vital Signs ED Triage Vitals  Encounter Vitals Group     BP 11/07/24 1057 138/83     Girls Systolic BP Percentile --      Girls Diastolic BP Percentile --      Boys Systolic BP Percentile --      Boys Diastolic BP Percentile --      Pulse Rate 11/07/24 1057 78     Resp 11/07/24 1057 20     Temp 11/07/24 1057 97.9 F (36.6 C)     Temp Source 11/07/24 1057 Temporal     SpO2 11/07/24 1057 95 %     Weight --      Height --      Head Circumference --      Peak Flow --      Pain Score 11/07/24 1100 5     Pain Loc --      Pain Education --      Exclude from Growth Chart --    No data found.  Updated Vital Signs BP 138/83 (BP Location: Right Arm)   Pulse 78   Temp 97.9 F (36.6 C) (Temporal)   Resp 20   SpO2 95%   Visual Acuity Right Eye Distance:   Left Eye Distance:   Bilateral Distance:    Right Eye Near:   Left Eye Near:    Bilateral Near:     Physical Exam Vitals and nursing note reviewed.  Constitutional:      Appearance: Normal appearance.  HENT:     Head: Atraumatic.     Right Ear: Tympanic membrane and external ear normal.     Left Ear: Tympanic membrane and external ear normal.     Nose: Rhinorrhea present.      Mouth/Throat:     Mouth: Mucous membranes are moist.     Pharynx: Posterior oropharyngeal erythema present.  Eyes:     Extraocular Movements: Extraocular movements intact.     Conjunctiva/sclera: Conjunctivae normal.  Cardiovascular:     Rate and Rhythm: Normal rate.  Pulmonary:     Effort: Pulmonary effort is normal.     Breath sounds: Normal breath sounds. No wheezing or rales.  Musculoskeletal:        General: Normal range of motion.     Cervical back: Normal range of motion and neck supple.  Skin:    General: Skin is warm and dry.  Neurological:     Mental Status: She is alert and oriented to person, place, and time.  Psychiatric:        Mood and Affect: Mood normal.        Thought Content: Thought content normal.      UC Treatments / Results  Labs (all labs ordered are listed, but only abnormal results are displayed) Labs Reviewed  POC COVID19/FLU A&B COMBO    EKG   Radiology No results found.  Procedures Procedures (including critical care time)  Medications Ordered in UC Medications - No data to display  Initial Impression / Assessment and Plan / UC Course  I have reviewed the triage vital signs and the nursing notes.  Pertinent labs & imaging results that were available during my care of the patient were reviewed by me and considered in my medical decision making (see chart for details).     Vital signs and exam very reassuring today, she is well-appearing and in no acute distress.  Rapid flu and COVID-negative.  She declines prescription remedies offered and wishes to continue over-the-counter supportive medications and home care.  Discussed increasing the use of her albuterol  inhaler to every 4 hours as needed and continuing Breztri  consistently.  Return for worsening or unresolving symptoms.  Final Clinical Impressions(s) / UC Diagnoses   Final diagnoses:  Viral URI with cough  Moderate persistent asthma with acute exacerbation     Discharge  Instructions      Your COVID and flu rapid test was negative today.  I suspect a different strain or virus to be causing your symptoms.  You may try medications such as Coricidin HBP, plain Mucinex, salt water  gargles, throat lozenges, humidifiers, saline sinus rinses.  Follow-up for worsening or unresolving symptoms.    ED Prescriptions   None    PDMP not reviewed this encounter.   Stuart Vernell Norris, PA-C 11/07/24 1157

## 2024-11-07 NOTE — ED Triage Notes (Incomplete)
 Pt reports she has nasal congestion, sore throat, and nasal congestion x 1 day

## 2024-11-07 NOTE — Discharge Instructions (Addendum)
 Your COVID and flu rapid test was negative today.  I suspect a different strain or virus to be causing your symptoms.  You may try medications such as Coricidin HBP, plain Mucinex, salt water  gargles, throat lozenges, humidifiers, saline sinus rinses.  Follow-up for worsening or unresolving symptoms.

## 2024-11-11 ENCOUNTER — Encounter: Payer: Self-pay | Admitting: Internal Medicine

## 2024-11-18 ENCOUNTER — Encounter: Payer: Self-pay | Admitting: Podiatry

## 2024-11-18 ENCOUNTER — Ambulatory Visit: Admitting: Podiatry

## 2024-11-18 DIAGNOSIS — L84 Corns and callosities: Secondary | ICD-10-CM | POA: Diagnosis not present

## 2024-11-18 DIAGNOSIS — M2032 Hallux varus (acquired), left foot: Secondary | ICD-10-CM | POA: Diagnosis not present

## 2024-11-21 NOTE — Progress Notes (Signed)
 Subjective: Chief Complaint  Patient presents with   RFC    Rm11 Routine foot care      83 year old female presents the office with above concerns.  She presents today for follow-up evaluation of preulcerative lesion on the right big toe.  She denies any swelling, redness or any drainage.  No open lesion that she reports.  She has no other concerns and no new ulcerations.    She is on Eliquis   Objective: AAO x3, NAD DP/PT pulses palpable bilaterally, CRT less than 3 seconds Hyperkeratotic preulcerative lesion noted on the right hallux medially.  The area is preulcerative but there is no underlying ulceration, drainage or signs of infection.  No open lesions identified otherwise.  Hallux malleus is present. Nails are mildly dystrophic and elongated. No pain with calf compression, swelling, warmth, erythema  Assessment: Preulcerative callus, hallux deformity  Plan: Preulcerative lesion/ulceration - Sharply debrided x 1 without any complications or bleeding as a courtesy.  The area still preulcerative and continue with moisturizer daily and offloading.  Monitor closely for any signs or symptoms of infection and/or skin breakdown. - As a courtesy debrided the nails without any complications or bleeding.  Return in about 9 weeks (around 01/20/2025).  Donnice JONELLE Fees DPM

## 2024-12-02 LAB — T4, FREE: Free T4: 1.11 ng/dL (ref 0.82–1.77)

## 2024-12-02 LAB — TSH: TSH: 2.81 u[IU]/mL (ref 0.450–4.500)

## 2024-12-09 ENCOUNTER — Ambulatory Visit: Admitting: Nurse Practitioner

## 2024-12-09 DIAGNOSIS — E039 Hypothyroidism, unspecified: Secondary | ICD-10-CM

## 2024-12-10 ENCOUNTER — Telehealth: Payer: Self-pay | Admitting: *Deleted

## 2024-12-10 NOTE — Telephone Encounter (Signed)
 I will call her.

## 2024-12-10 NOTE — Telephone Encounter (Signed)
 Patient left a message this morning that she had called and canceled her appointment for yesterday , due to sickness. Her message this morning , she is asking if she can be seen this morning or one day next week.

## 2024-12-20 ENCOUNTER — Encounter: Payer: Self-pay | Admitting: Nurse Practitioner

## 2024-12-20 ENCOUNTER — Ambulatory Visit: Admitting: Nurse Practitioner

## 2024-12-20 VITALS — BP 102/70 | HR 68 | Ht 64.0 in | Wt 155.0 lb

## 2024-12-20 DIAGNOSIS — E039 Hypothyroidism, unspecified: Secondary | ICD-10-CM

## 2024-12-20 MED ORDER — LEVOTHYROXINE SODIUM 50 MCG PO TABS: 50.0000 ug | ORAL_TABLET | Freq: Every day | ORAL | 3 refills | Status: AC

## 2024-12-20 NOTE — Patient Instructions (Signed)

## 2024-12-20 NOTE — Progress Notes (Signed)
 "                                                             Endocrinology Follow Up Visit                                        12/20/2024, 10:59 AM     SUBJECTIVE:  [name] is a 84 y.o.-year-old female patient being seen in follow up after being seen in consultation for hypothyroidism referred by Shona Norleen PEDLAR, MD.   Past Medical History:  Diagnosis Date   Anemia    Anxiety    Arthritis    Asthma    Atrial flutter (HCC)    Constipation    Dysrhythmia    GERD (gastroesophageal reflux disease)    High blood pressure    History of COVID-19 12/28/2020   Hypothyroidism    PAD (peripheral artery disease)    bilateral legs   Peripheral edema    Peripheral neuropathy    bilateral feet at night occ   Psoriasis    Seasonal allergies    Skin cancer    Left eye brow   Varicose veins    Vertigo 11/23/2013    Past Surgical History:  Procedure Laterality Date   APPENDECTOMY     patient unsure   BIOPSY  12/21/2020   Procedure: BIOPSY;  Surgeon: Shaaron Lamar HERO, MD;  [facility]: [facility];  Service: Endoscopy;;   BIOPSY  05/28/2021   Procedure: BIOPSY;  Surgeon: Cindie Carlin POUR, DO;  Location: [facility];  Service: Endoscopy;;   CARDIOVERSION N/A 04/04/2023   Procedure: CARDIOVERSION;  Surgeon: Alvan Dorn FALCON, MD;  Location: [facility];  Service: Endoscopy;  Laterality: N/A;   CATARACT EXTRACTION W/PHACO Left 01/27/2017   Procedure: CATARACT EXTRACTION PHACO AND INTRAOCULAR LENS PLACEMENT (IOC);  Surgeon: Cherene Mania, MD;  Location: [facility];  Service: Ophthalmology;  Laterality: Left;  CDE: 29.90   CATARACT EXTRACTION W/PHACO Right 02/24/2017   Procedure: CATARACT EXTRACTION PHACO AND INTRAOCULAR LENS PLACEMENT (IOC);  Surgeon: Cherene Mania, MD;  Location: [facility];  Service: Ophthalmology;  Laterality: Right;  CDE: 8.57   CHOLECYSTECTOMY     COLONOSCOPY N/A 10/01/2016   Procedure: COLONOSCOPY;  Surgeon: Lamar HERO Shaaron, MD; pancolonic diverticulosis, melanosis coli,  otherwise normal exam.  No recommendations to repeat colonoscopy.   COLONOSCOPY N/A 04/12/2024   Procedure: COLONOSCOPY;  Surgeon: Shaaron Lamar HERO, MD;  [facility]: [facility];  Service: Endoscopy;  Laterality: N/A;  8:45 am, asa 3   COLONOSCOPY WITH PROPOFOL  N/A 12/21/2020   Rourk: Diverticulosis   ESOPHAGOGASTRODUODENOSCOPY N/A 04/12/2024   Procedure: EGD (ESOPHAGOGASTRODUODENOSCOPY);  Surgeon: Shaaron Lamar HERO, MD;  [facility]: [facility];  Service: Endoscopy;  Laterality: N/A;   ESOPHAGOGASTRODUODENOSCOPY (EGD) WITH PROPOFOL  N/A 12/21/2020   Rourk: Erosive reflux esophagitis, nonbleeding gastric ulcers with biopsy showing erosion/ulceration, negative for H. pylori.   ESOPHAGOGASTRODUODENOSCOPY (EGD) WITH PROPOFOL  N/A 05/28/2021   Carver: gastritis (reactive gastropathy), no H.pylori   FLEXOR TENDON REPAIR Right 11/06/2017   Procedure: RIGHT WRIST FLEXOR TENDON REPAIRAND STT DEBRIEDMENT;  Surgeon: Murrell Drivers, MD;  Location: [facility];  Service: Orthopedics;  Laterality: Right;   MOUTH  SURGERY     artery bleed   TOTAL HIP ARTHROPLASTY Right 01/31/2021   Procedure: TOTAL HIP ARTHROPLASTY ANTERIOR APPROACH;  Surgeon: Fidel Rogue, MD;  Location: [facility];  Service: Orthopedics;  Laterality: Right;   TUBAL LIGATION      Social History   Socioeconomic History   Marital status: Married    Spouse name: Not on file   Number of children: Not on file   Years of education: Not on file   Highest education level: Not on file  Occupational History   Not on file  Tobacco Use   Smoking status: Former    Current packs/day: 0.00    Average packs/day: 0.3 packs/day for 5.0 years (1.3 ttl pk-yrs)    Types: Cigarettes    Start date: 12/02/1992    Quit date: 12/02/1997    Years since quitting: 27.0    Passive exposure: Past   Smokeless tobacco: Never  Vaping Use   Vaping status: Never Used  Substance and Sexual Activity   Alcohol  use: Not Currently    Alcohol /week: 1.0 standard  drink of alcohol     Types: 1 Glasses of wine per week   Drug use: No   Sexual activity: Not Currently    Birth control/protection: Surgical    Comment: tubal  Other Topics Concern   Not on file  Social History Narrative   Not on file   Social Drivers of Health   Tobacco Use: Medium Risk (12/20/2024)   Patient History    Smoking Tobacco Use: Former    Smokeless Tobacco Use: Never    Passive Exposure: Past  Physicist, Medical Strain: Not on file  Food Insecurity: Not on file  Transportation Needs: No Transportation Needs (02/19/2022)   PRAPARE - Administrator, Civil Service (Medical): No    Lack of Transportation (Non-Medical): No  Physical Activity: Not on file  Stress: Not on file  Social Connections: Not on file  Depression (PHQ2-9): Low Risk (02/19/2022)   Depression (PHQ2-9)    PHQ-2 Score: 0  Alcohol  Screen: Not on file  Housing: Low Risk (02/19/2022)   Housing    Last Housing Risk Score: 0  Utilities: Not on file  Health Literacy: Not on file    Family History  Problem Relation Age of Onset   Hypertension Mother    Varicose Veins Father    Heart attack Father    Heart disease Father    Hypertension Brother    COPD Brother     Outpatient Encounter Medications as of 12/20/2024  Medication Sig   acetaminophen  (TYLENOL ) 500 MG tablet Take 500-1,000 mg by mouth as needed for mild pain or moderate pain.   albuterol  (VENTOLIN  HFA) 108 (90 Base) MCG/ACT inhaler Inhale 2 puffs into the lungs every 6 (six) hours as needed for wheezing or shortness of breath.   ALPRAZolam  (XANAX ) 0.5 MG tablet Take 0.5 mg by mouth in the morning, at noon, and at bedtime. (1000, 1500, AT BEDTIME)   azelastine  (ASTELIN ) 0.1 % nasal spray Place 1 spray into both nostrils 2 (two) times daily.   Budeson-Glycopyrrol-Formoterol  (BREZTRI  AEROSPHERE) 160-9-4.8 MCG/ACT AERO Inhale 2 puffs into the lungs daily.   ELIQUIS  5 MG TABS tablet TAKE (1) TABLET BY MOUTH TWICE DAILY.   famotidine   (PEPCID ) 20 MG tablet TAKE ONE TABLET BY MOUTH DAILY AFTER SUPPER   fluocinonide gel (LIDEX) 0.05 % Apply 1 Application topically 2 (two) times daily.   fluticasone  (FLONASE ) 50 MCG/ACT nasal spray USE 2 SPRAYS  INTO BOTH NOSTRILS EVERY MORNING   furosemide  (LASIX ) 40 MG tablet TAKE ONE TABLET BY MOUTH EVERY DAY. MAY TAKE ADDITIONAL TABLET AS NEEDED IF SHORT OF BREATH CONTINUES   levocetirizine (XYZAL ) 5 MG tablet Take 1 tablet (5 mg total) by mouth daily as needed for allergies (Can take an extra dose during flare ups.).   losartan  (COZAAR ) 50 MG tablet Take 50 mg by mouth 2 (two) times daily.   Multiple Vitamin (MULTIVITAMIN WITH MINERALS) TABS tablet Take 1 tablet by mouth daily in the afternoon.   NONFORMULARY OR COMPOUNDED ITEM Apply 1 Application topically 3 (three) times daily as needed (nerve pain.). Baclofen 2%/Diclofenac Sodium 3%/Gabapentin  5%/Lidocaine  HCl 5%/Menthol  1% Topical Gel   pantoprazole  (PROTONIX ) 40 MG tablet TAKE 1 TABLET BY MOUTH ONCE A DAY.   polyethylene glycol (MIRALAX  / GLYCOLAX ) 17 g packet Take 17 g by mouth every evening.   potassium chloride  (KLOR-CON ) 10 MEQ tablet Take 2 tablets (20 mEq total) by mouth daily.   raloxifene  (EVISTA ) 60 MG tablet Take 1 tablet (60 mg total) by mouth every morning.   silver  sulfADIAZINE  (SILVADENE ) 1 % cream Apply 1 Application topically daily.   SODIUM FLUORIDE 5000 PPM 1.1 % PSTE Take by mouth at bedtime.   venlafaxine  XR (EFFEXOR -XR) 75 MG 24 hr capsule Take 75 mg by mouth daily after breakfast.   vitamin D3 (CHOLECALCIFEROL) 25 MCG tablet Take 1,000 Units by mouth daily.   [DISCONTINUED] levothyroxine  (SYNTHROID ) 50 MCG tablet Take 1 tablet (50 mcg total) by mouth daily before breakfast.   levothyroxine  (SYNTHROID ) 50 MCG tablet Take 1 tablet (50 mcg total) by mouth daily before breakfast.   No facility-administered encounter medications on file as of 12/20/2024.    ALLERGIES: Allergies  Allergen Reactions   Amoxicillin-Pot  Clavulanate Nausea And Vomiting, Other (See Comments), Itching and Nausea Only    Has patient had a PCN reaction causing immediate rash, facial/tongue/throat swelling, SOB or lightheadedness with hypotension: No Has patient had a PCN reaction causing severe rash involving mucus membranes or skin necrosis: No Has patient had a PCN reaction that required hospitalization: No Has patient had a PCN reaction occurring within the last 10 years: No If all of the above answers are NO, then may proceed with Cephalosporin use. Has patient had a PCN reaction causing immediate rash, facial/tongue/throat swelling, SOB or lightheadedness with hypotension: No Has patient had a PCN reaction causing severe rash involving mucus membranes or skin necrosis: No Has patient had a PCN reaction that required hospitalization: No Has patient had a PCN reaction occurring within the last 10 years: No If all of the above answers are NO, then may proceed with Cephalosporin use.    Ace Inhibitors Cough   Guaifenesin Hives and Other (See Comments)   Lisinopril Cough and Other (See Comments)   Cetirizine  Other (See Comments)    Unknown reaction   Clarithromycin      Made her feel crazy.    Entex La Itching   Gabapentin  Other (See Comments)    Foggy head   Jardiance  [Empagliflozin ] Other (See Comments)    Increased urination   Levofloxacin Hives and Itching   Meloxicam  Other (See Comments)    (MOBIC ) Unknown reaction type (maybe nausea?)   VACCINATION STATUS: Immunization History  Administered Date(s) Administered   INFLUENZA, HIGH DOSE SEASONAL PF 09/11/2018, 11/02/2019, 09/20/2021, 08/07/2022   Influenza Whole 09/01/2016   Influenza-Unspecified 08/02/2013, 08/29/2015   PFIZER(Purple Top)SARS-COV-2 Vaccination 12/16/2019, 01/17/2020, 09/28/2020   Pneumococcal Conjugate-13 08/31/2019   Tdap  10/13/2022   Zoster Recombinant(Shingrix) 08/31/2019     HPI  Thyroid  Problem Presents for follow-up visit. Symptoms  include anxiety. Patient reports no cold intolerance, constipation, depressed mood, fatigue, heat intolerance, palpitations, tremors, weight gain or weight loss. The symptoms have been stable.    [name]  is a patient with the above medical history.  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    I reviewed patient's thyroid  tests:  Lab Results  Component Value Date   TSH 2.810 12/01/2024   TSH 3.180 04/30/2024   TSH 2.510 01/22/2024   TSH 3.780 12/23/2023   TSH 3.359 10/22/2023   TSH 5.800 (H) 09/10/2023   TSH 2.300 03/18/2023   TSH 3.140 09/16/2022   TSH 1.740 03/18/2022   TSH 2.670 09/19/2021   FREET4 1.11 12/01/2024   FREET4 1.10 04/30/2024   FREET4 1.12 01/22/2024   FREET4 1.19 12/23/2023   FREET4 0.89 10/22/2023   FREET4 1.29 09/10/2023   FREET4 1.09 03/18/2023   FREET4 1.23 09/16/2022   FREET4 1.38 03/18/2022   FREET4 1.09 09/19/2021     Pt denies feeling nodules in neck, hoarseness, dysphagia/odynophagia, SOB with lying down.  she has No history of radiation therapy to head or neck.  No recent use of iodine  supplements.  I reviewed her chart and she also has a history of GERD with ulcers, depression/anxiety, anemia, asthma, and high blood pressure.   Review of systems  Constitutional: + Minimally fluctuating body weight,  current Body mass index is 26.61 kg/m. , no fatigue, no subjective hyperthermia, no subjective hypothermia Eyes: no blurry vision, no xerophthalmia ENT: + sore throat- recent head cold, no nodules palpated in throat, no dysphagia/odynophagia, no hoarseness, mouth irritation Cardiovascular: no chest pain, no shortness of breath, no palpitations, no leg swelling Respiratory: no cough, no shortness of breath Gastrointestinal: no nausea/vomiting/diarrhea Musculoskeletal: no muscle/joint aches Skin: no rashes, no hyperemia Neurological: no tremors, no numbness, no tingling, no dizziness Psychiatric: no  depression, no anxiety  ------------------------------------------------------------------------------------------------------------------ OBJECTIVE:  BP 102/70 (BP Location: Left Arm, Patient Position: Sitting, Cuff Size: Large)   Pulse 68   Ht 5' 4 (1.626 m)   Wt 155 lb (70.3 kg)   BMI 26.61 kg/m  Wt Readings from Last 3 Encounters:  12/20/24 155 lb (70.3 kg)  09/23/24 150 lb 12.8 oz (68.4 kg)  08/30/24 150 lb 12.8 oz (68.4 kg)   BP Readings from Last 3 Encounters:  12/20/24 102/70  11/07/24 138/83  08/30/24 118/72     Physical Exam- Limited  Constitutional:  Body mass index is 26.61 kg/m. , not in acute distress, normal state of mind Eyes:  EOMI, no exophthalmos Musculoskeletal: no gross deformities, strength intact in all four extremities, no gross restriction of joint movements Skin:  no rashes, no hyperemia Neurological: no tremor with outstretched hands   CMP ( most recent) CMP     Component Value Date/Time   NA 139 03/03/2024 1217   K 4.5 03/03/2024 1217   CL 100 03/03/2024 1217   CO2 23 03/03/2024 1217   GLUCOSE 90 03/03/2024 1217   GLUCOSE 93 11/11/2023 1142   BUN 16 03/03/2024 1217   CREATININE 1.00 03/03/2024 1217   CREATININE 0.94 (H) 01/22/2021 1419   CALCIUM 9.2 03/03/2024 1217   PROT 7.2 02/20/2023 1337   PROT 6.5 09/24/2018 1204   ALBUMIN 4.1 02/20/2023 1337   ALBUMIN 4.3 09/24/2018 1204   AST 24 02/20/2023 1337   ALT 18 02/20/2023 1337  ALKPHOS 74 02/20/2023 1337   BILITOT 0.8 02/20/2023 1337   BILITOT 0.4 09/24/2018 1204   GFRNONAA 53 (L) 11/11/2023 1142   GFRAA >60 05/08/2020 1232     Diabetic Labs (most recent): No results found for: HGBA1C, MICROALBUR   Lipid Panel ( most recent) Lipid Panel     Component Value Date/Time   TRIG 92 09/11/2020 1547       Lab Results  Component Value Date   TSH 2.810 12/01/2024   TSH 3.180 04/30/2024   TSH 2.510 01/22/2024   TSH 3.780 12/23/2023   TSH 3.359 10/22/2023   TSH 5.800  (H) 09/10/2023   TSH 2.300 03/18/2023   TSH 3.140 09/16/2022   TSH 1.740 03/18/2022   TSH 2.670 09/19/2021   FREET4 1.11 12/01/2024   FREET4 1.10 04/30/2024   FREET4 1.12 01/22/2024   FREET4 1.19 12/23/2023   FREET4 0.89 10/22/2023   FREET4 1.29 09/10/2023   FREET4 1.09 03/18/2023   FREET4 1.23 09/16/2022   FREET4 1.38 03/18/2022   FREET4 1.09 09/19/2021      Latest Reference Range & Units 10/22/23 12:16 12/23/23 12:15 01/22/24 16:27 04/30/24 12:19 12/01/24 10:47  TSH 0.450 - 4.500 uIU/mL 3.359 3.780 2.510 3.180 2.810  T4,Free(Direct) 0.82 - 1.77 ng/dL 9.10 8.80 8.87 8.89 8.88    --------------------------------------------------------------------------------------------------------------------   ASSESSMENT / PLAN: 1. Hypothyroidism- acquired  Patient with long-standing hypothyroidism, on levothyroxine  therapy. On physical exam, patient does not have gross goiter, thyroid  nodules, or neck compression symptoms.  Thyroid  US  WNL for age.  -Her previsit thyroid  function tests are consistent with appropriate hormone replacement.  She is advised to continue Levothyroxine  50 mcg po daily before breakfast.   Will repeat TFTs in 1 year.  - We discussed about correct intake of levothyroxine , at fasting, with water , separated by at least 30 minutes from breakfast. -Patient is made aware of the fact that thyroid  hormone replacement is needed for life, dose to be adjusted by periodic monitoring of thyroid  function tests.  I did give patient instructions on how to make OTC Magic mouthwash to help with her recent oral irritation from recent head cold.  Equal parts liquid benadry, glyoxide, and mylanta with instructions to swish and spit twice daily  She notes she has been to her PCP and urgent care for similar issue but was not given anything.  She states her cold symptoms are slowly improving.   I spent  33  minutes in the care of the patient today including review of labs from Thyroid   Function, CMP, and other relevant labs ; imaging/biopsy records (current and previous including abstractions from other facilities); face-to-face time discussing  her lab results and symptoms, medications doses, her options of short and long term treatment based on the latest standards of care / guidelines;   and documenting the encounter.  Alfornia SHAUNNA Drones  participated in the discussions, expressed understanding, and voiced agreement with the above plans.  All questions were answered to her satisfaction. she is encouraged to contact clinic should she have any questions or concerns prior to her return visit.   Follow Up Plan: Return in about 1 year (around 12/20/2025) for Thyroid  follow up, Previsit labs.   Benton Rio, [facility] [address] [place], [place] [address] Phone: [phone] Fax: [phone]  12/20/2024, 10:59 AM   "

## 2025-01-21 ENCOUNTER — Ambulatory Visit: Admitting: Podiatry

## 2025-03-01 ENCOUNTER — Ambulatory Visit: Admitting: Internal Medicine

## 2025-03-09 ENCOUNTER — Ambulatory Visit: Admitting: Allergy & Immunology

## 2025-12-20 ENCOUNTER — Ambulatory Visit: Admitting: Nurse Practitioner
